# Patient Record
Sex: Female | Born: 1935 | Race: White | Hispanic: No | Smoking: Never smoker
Health system: Southern US, Community
[De-identification: ages and names within clinical notes are randomized; demographics above are authoritative.]

## PROBLEM LIST (undated history)

## (undated) DIAGNOSIS — J189 Pneumonia, unspecified organism: Secondary | ICD-10-CM

## (undated) DIAGNOSIS — I5189 Other ill-defined heart diseases: Secondary | ICD-10-CM

## (undated) DIAGNOSIS — I739 Peripheral vascular disease, unspecified: Secondary | ICD-10-CM

## (undated) DIAGNOSIS — K529 Noninfective gastroenteritis and colitis, unspecified: Secondary | ICD-10-CM

## (undated) DIAGNOSIS — C801 Malignant (primary) neoplasm, unspecified: Secondary | ICD-10-CM

## (undated) DIAGNOSIS — J45909 Unspecified asthma, uncomplicated: Secondary | ICD-10-CM

## (undated) DIAGNOSIS — I2510 Atherosclerotic heart disease of native coronary artery without angina pectoris: Secondary | ICD-10-CM

## (undated) DIAGNOSIS — I82409 Acute embolism and thrombosis of unspecified deep veins of unspecified lower extremity: Secondary | ICD-10-CM

## (undated) DIAGNOSIS — I5032 Chronic diastolic (congestive) heart failure: Secondary | ICD-10-CM

## (undated) DIAGNOSIS — M810 Age-related osteoporosis without current pathological fracture: Secondary | ICD-10-CM

## (undated) DIAGNOSIS — J479 Bronchiectasis, uncomplicated: Secondary | ICD-10-CM

## (undated) DIAGNOSIS — I43 Cardiomyopathy in diseases classified elsewhere: Secondary | ICD-10-CM

## (undated) DIAGNOSIS — G43109 Migraine with aura, not intractable, without status migrainosus: Secondary | ICD-10-CM

## (undated) DIAGNOSIS — I6529 Occlusion and stenosis of unspecified carotid artery: Secondary | ICD-10-CM

## (undated) DIAGNOSIS — M1990 Unspecified osteoarthritis, unspecified site: Secondary | ICD-10-CM

## (undated) DIAGNOSIS — F329 Major depressive disorder, single episode, unspecified: Secondary | ICD-10-CM

## (undated) DIAGNOSIS — E854 Organ-limited amyloidosis: Secondary | ICD-10-CM

## (undated) DIAGNOSIS — I4719 Other supraventricular tachycardia: Secondary | ICD-10-CM

## (undated) DIAGNOSIS — C50919 Malignant neoplasm of unspecified site of unspecified female breast: Secondary | ICD-10-CM

## (undated) DIAGNOSIS — R42 Dizziness and giddiness: Secondary | ICD-10-CM

## (undated) DIAGNOSIS — F32A Depression, unspecified: Secondary | ICD-10-CM

## (undated) DIAGNOSIS — I255 Ischemic cardiomyopathy: Secondary | ICD-10-CM

## (undated) DIAGNOSIS — I10 Essential (primary) hypertension: Secondary | ICD-10-CM

## (undated) DIAGNOSIS — I471 Supraventricular tachycardia: Secondary | ICD-10-CM

## (undated) DIAGNOSIS — M797 Fibromyalgia: Secondary | ICD-10-CM

## (undated) DIAGNOSIS — J449 Chronic obstructive pulmonary disease, unspecified: Secondary | ICD-10-CM

## (undated) DIAGNOSIS — I509 Heart failure, unspecified: Secondary | ICD-10-CM

## (undated) DIAGNOSIS — D649 Anemia, unspecified: Secondary | ICD-10-CM

## (undated) DIAGNOSIS — A809 Acute poliomyelitis, unspecified: Secondary | ICD-10-CM

## (undated) HISTORY — DX: Organ-limited amyloidosis: I43

## (undated) HISTORY — DX: Other ill-defined heart diseases: I51.89

## (undated) HISTORY — PX: SQUAMOUS CELL CARCINOMA EXCISION: SHX2433

## (undated) HISTORY — DX: Heart failure, unspecified: I50.9

## (undated) HISTORY — PX: TONSILLECTOMY: SUR1361

## (undated) HISTORY — PX: BACK SURGERY: SHX140

## (undated) HISTORY — DX: Dizziness and giddiness: R42

## (undated) HISTORY — DX: Essential (primary) hypertension: I10

## (undated) HISTORY — PX: BREAST SURGERY: SHX581

## (undated) HISTORY — DX: Fibromyalgia: M79.7

## (undated) HISTORY — PX: BILATERAL TOTAL MASTECTOMY WITH AXILLARY LYMPH NODE DISSECTION: SHX6364

## (undated) HISTORY — PX: ABDOMINAL HYSTERECTOMY: SHX81

## (undated) HISTORY — DX: Chronic diastolic (congestive) heart failure: I50.32

## (undated) HISTORY — DX: Migraine with aura, not intractable, without status migrainosus: G43.109

## (undated) HISTORY — PX: VAGINAL HYSTERECTOMY: SHX2639

## (undated) HISTORY — DX: Organ-limited amyloidosis: E85.4

## (undated) HISTORY — PX: HERNIA REPAIR: SHX51

## (undated) HISTORY — PX: REPLACEMENT TOTAL KNEE: SUR1224

## (undated) HISTORY — DX: Unspecified osteoarthritis, unspecified site: M19.90

## (undated) HISTORY — DX: Age-related osteoporosis without current pathological fracture: M81.0

## (undated) HISTORY — DX: Supraventricular tachycardia: I47.1

## (undated) HISTORY — DX: Atherosclerotic heart disease of native coronary artery without angina pectoris: I25.10

## (undated) HISTORY — PX: FINGER TENDON REPAIR: SHX1640

## (undated) HISTORY — PX: UMBILICAL HERNIA REPAIR: SHX196

## (undated) HISTORY — PX: CARPAL TUNNEL RELEASE: SHX101

## (undated) HISTORY — DX: Other supraventricular tachycardia: I47.19

## (undated) HISTORY — DX: Ischemic cardiomyopathy: I25.5

## (undated) HISTORY — DX: Acute embolism and thrombosis of unspecified deep veins of unspecified lower extremity: I82.409

---
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
## 2007-06-06 DIAGNOSIS — J479 Bronchiectasis, uncomplicated: Secondary | ICD-10-CM

## 2007-06-06 HISTORY — DX: Bronchiectasis, uncomplicated: J47.9

## 2014-04-12 ENCOUNTER — Emergency Department: Payer: Self-pay | Admitting: Emergency Medicine

## 2014-04-19 ENCOUNTER — Telehealth: Payer: Self-pay | Admitting: Cardiovascular Disease

## 2014-04-19 NOTE — Telephone Encounter (Signed)
Patient would like for you to call her regarding what records Dr. Rockey Situ needs from her provider in [place].  She said that she has access to records but does not know specifically what we need.

## 2014-04-20 NOTE — Telephone Encounter (Signed)
Spoke w/ pt.  She states that her records on "e-clinical". Advised her that we would need any records related to any cardiac testing and/or lipids. She states that she has a "filter" in her leg, but she is unsure of when. Advised pt that we can have her sign a release at her visit and obtain these records for her if she is unable to do so.  She is appreciative and would like to see about getting a sooner appt.

## 2014-04-30 ENCOUNTER — Encounter (INDEPENDENT_AMBULATORY_CARE_PROVIDER_SITE_OTHER): Payer: Self-pay

## 2014-04-30 ENCOUNTER — Ambulatory Visit (INDEPENDENT_AMBULATORY_CARE_PROVIDER_SITE_OTHER): Payer: Medicare HMO | Admitting: Cardiovascular Disease

## 2014-04-30 ENCOUNTER — Encounter: Payer: Self-pay | Admitting: Cardiovascular Disease

## 2014-04-30 VITALS — BP 128/88 | HR 59 | Ht 63.0 in | Wt 126.2 lb

## 2014-04-30 DIAGNOSIS — M7989 Other specified soft tissue disorders: Secondary | ICD-10-CM | POA: Insufficient documentation

## 2014-04-30 DIAGNOSIS — R030 Elevated blood-pressure reading, without diagnosis of hypertension: Secondary | ICD-10-CM

## 2014-04-30 DIAGNOSIS — Z95828 Presence of other vascular implants and grafts: Secondary | ICD-10-CM

## 2014-04-30 DIAGNOSIS — Z9889 Other specified postprocedural states: Secondary | ICD-10-CM

## 2014-04-30 DIAGNOSIS — Z86718 Personal history of other venous thrombosis and embolism: Secondary | ICD-10-CM | POA: Insufficient documentation

## 2014-04-30 DIAGNOSIS — R0602 Shortness of breath: Secondary | ICD-10-CM | POA: Insufficient documentation

## 2014-04-30 DIAGNOSIS — I158 Other secondary hypertension: Secondary | ICD-10-CM

## 2014-04-30 DIAGNOSIS — R0989 Other specified symptoms and signs involving the circulatory and respiratory systems: Secondary | ICD-10-CM | POA: Insufficient documentation

## 2014-04-30 DIAGNOSIS — J479 Bronchiectasis, uncomplicated: Secondary | ICD-10-CM | POA: Insufficient documentation

## 2014-04-30 NOTE — Assessment & Plan Note (Signed)
Leg swelling predominantly on the right, from venous insufficiency. Exacerbated by right knee surgery, history of DVT, notable varicosities seen on exam. Recommended leg elevation and compression hose. If symptoms get worse, could try low-dose diuretic. Currently taking HCTZ at low-dose. Symptoms seem stable

## 2014-04-30 NOTE — Patient Instructions (Signed)
You are doing well.  Please monitor your blood pressure at home [facility] to try a 1/2 pill daily Goal BP <140/<90 Call with numbers  For leg swelling: Compression hose Leg wrap? Leg elevation  Please call us if you have new issues that need to be addressed before your next appt.

## 2014-04-30 NOTE — Progress Notes (Signed)
Patient ID: Lindsey [name], female    DOB: [birth year], 78 y.o.   MRN: [id number]  HPI Comments: Lindsey [name] is a pleasant 78 year old woman with history of lower extremity swelling, prior right knee surgery, right DVT, status post IVC filter, bronchiectasis on inhalers and nebulizers at home who presents for evaluation of labile blood pressure and her leg swelling.  She reports that she has a chronic history of right lower extremity swelling, much worse than the left. History dates back many years but started with right knee surgery after an accident, fracture. She suffered a DVT, had IVC filter placed. She does not wear compression hose on a regular basis. Swelling worse at the end of the day, better in the morning. She does have significant varicosities.  She reports her blood pressure relatively labile. Typically is well controlled in the 120-130 range, often higher in the setting of migraines or headaches.  She does report an exposure to black mold in the past, never smoked. This is likely the cause of her bronchiectasis. This was diagnosed at outside facility. She does do periodic antibiotics and prednisone. Take nebulizers and Dularea at home.  Port having a stress test may 2015 which was reportedly normal  Recently in the emergency room several weeks ago for headache. Blood pressure that time was elevated, CT scan of the head was normal. Periodically has severe headaches  EKG shows normal sinus rhythm with rate 59 beats per minute, right bundle branch block, left anterior fascicular block   Outpatient Encounter Prescriptions as of 04/30/2014  Medication Sig  . Calcium Carbonate-Vitamin D (CALCIUM 500 + D PO) Take 1,000 mcg by mouth daily.  . DULoxetine (CYMBALTA) 60 MG capsule Take 60 mg by mouth daily.  Marland Kitchen losartan-hydrochlorothiazide (HYZAAR) 100-25 MG per tablet Take 1 tablet by mouth daily.  . mometasone-formoterol (DULERA) 100-5 MCG/ACT AERO Inhale 2 puffs into the lungs 2 (two)  times daily.  . Omega-3 Fatty Acids (FISH OIL) 1000 MG CAPS Take 1,000 mg by mouth daily.  . Probiotic Product (PRO-BIOTIC BLEND) CAPS Take by mouth daily.  . rizatriptan (MAXALT) 10 MG tablet Take 10 mg by mouth as needed for migraine. May repeat in 2 hours if needed    Review of Systems  Constitutional: Negative.   HENT: Negative.   Eyes: Negative.   Respiratory: Negative.   Cardiovascular: Negative.   Gastrointestinal: Negative.   Endocrine: Negative.   Musculoskeletal: Negative.   Skin: Negative.   Allergic/Immunologic: Negative.   Neurological: Negative.   Hematological: Negative.   Psychiatric/Behavioral: Negative.   All other systems reviewed and are negative.   BP 128/88  Pulse 59  Ht 5\' 3"  (1.6 m)  Wt 126 lb 4 oz (57.267 kg)  BMI 22.37 kg/m2  Physical Exam  Nursing note and vitals reviewed. Constitutional: She is oriented to person, place, and time. She appears well-developed and well-nourished.  HENT:  Head: Normocephalic.  Nose: Nose normal.  Mouth/Throat: Oropharynx is clear and moist.  Eyes: Conjunctivae are normal. Pupils are equal, round, and reactive to light.  Neck: Normal range of motion. Neck supple. No JVD present.  Cardiovascular: Normal rate, regular rhythm, S1 normal, S2 normal, normal heart sounds and intact distal pulses.  Exam reveals no gallop and no friction rub.   No murmur heard. Pulmonary/Chest: Effort normal and breath sounds normal. No respiratory distress. She has no wheezes. She has no rales. She exhibits no tenderness.  Abdominal: Soft. Bowel sounds are normal. She exhibits no distension. There is no  tenderness.  Musculoskeletal: Normal range of motion. She exhibits no edema and no tenderness.  Lymphadenopathy:    She has no cervical adenopathy.  Neurological: She is alert and oriented to person, place, and time. Coordination normal.  Skin: Skin is warm and dry. No rash noted. No erythema.  Psychiatric: She has a normal mood and  affect. Her behavior is normal. Judgment and thought content normal.    Assessment and Plan

## 2014-04-30 NOTE — Assessment & Plan Note (Signed)
Recommended she use compression hose for long car trips or plane rides. She will be high risk of DVT given prior history of DVT. She does have IVC filter in place

## 2014-04-30 NOTE — Assessment & Plan Note (Signed)
For the most part blood pressure seems to be well controlled when she does not have pain. Recommended that she monitor her blood pressure at home when she feels well, not only when she has headaches or pain. Suggested she call our office if numbers run high on a regular basis. If blood pressure was low, would cut her Hyzaar in half

## 2014-04-30 NOTE — Assessment & Plan Note (Signed)
Prior trauma to her right knee with fracture. Residual lower extremity swelling

## 2014-04-30 NOTE — Assessment & Plan Note (Signed)
History of bronchiectasis which she attributes to previous exposure to mold. No smoking history. Periodically need antibiotics, prednisone. She does take nebulizers and dulera at home

## 2014-10-10 ENCOUNTER — Inpatient Hospital Stay: Payer: Self-pay | Admitting: Internal Medicine

## 2014-10-10 LAB — BASIC METABOLIC PANEL
Anion Gap: 8 (ref 7–16)
BUN: 13 mg/dL (ref 7–18)
Calcium, Total: 8.2 mg/dL — ABNORMAL LOW (ref 8.5–10.1)
Chloride: 101 mmol/L (ref 98–107)
Co2: 28 mmol/L (ref 21–32)
Creatinine: 0.49 mg/dL — ABNORMAL LOW (ref 0.60–1.30)
EGFR (African American): >60
EGFR (Non-African Amer.): >60
Glucose: 102 mg/dL — ABNORMAL HIGH (ref 65–99)
Osmolality: 274 (ref 275–301)
Potassium: 3.5 mmol/L (ref 3.5–5.1)
Sodium: 137 mmol/L (ref 136–145)

## 2014-10-10 LAB — CBC WITH DIFFERENTIAL/PLATELET
Basophil #: 0 10*3/uL (ref 0.0–0.1)
Basophil %: 0.2 %
Eosinophil #: 0 10*3/uL (ref 0.0–0.7)
Eosinophil %: 0.1 %
HCT: 38.1 % (ref 35.0–47.0)
HGB: 13 g/dL (ref 12.0–16.0)
Lymphocyte #: 0.3 10*3/uL — ABNORMAL LOW (ref 1.0–3.6)
Lymphocyte %: 2.8 %
MCH: 31.1 pg (ref 26.0–34.0)
MCHC: 34.2 g/dL (ref 32.0–36.0)
MCV: 91 fL (ref 80–100)
Monocyte #: 0.5 x10 3/mm (ref 0.2–0.9)
Monocyte %: 5.5 %
Neutrophil #: 8.2 10*3/uL — ABNORMAL HIGH (ref 1.4–6.5)
Neutrophil %: 91.4 %
Platelet: 217 10*3/uL (ref 150–440)
RBC: 4.18 10*6/uL (ref 3.80–5.20)
RDW: 13.6 % (ref 11.5–14.5)
WBC: 8.9 10*3/uL (ref 3.6–11.0)

## 2014-10-10 LAB — INFLUENZA A,B,H1N1 - PCR (ARMC)
H1N1 flu by pcr: NOT DETECTED
Influenza A By PCR: NEGATIVE
Influenza B By PCR: NEGATIVE

## 2014-10-11 LAB — BASIC METABOLIC PANEL
Anion Gap: 6 — ABNORMAL LOW (ref 7–16)
BUN: 10 mg/dL (ref 7–18)
Calcium, Total: 7.9 mg/dL — ABNORMAL LOW (ref 8.5–10.1)
Chloride: 103 mmol/L (ref 98–107)
Co2: 28 mmol/L (ref 21–32)
Creatinine: 0.63 mg/dL (ref 0.60–1.30)
EGFR (African American): >60
EGFR (Non-African Amer.): >60
Glucose: 105 mg/dL — ABNORMAL HIGH (ref 65–99)
Osmolality: 273 (ref 275–301)
Potassium: 3.2 mmol/L — ABNORMAL LOW (ref 3.5–5.1)
Sodium: 137 mmol/L (ref 136–145)

## 2014-10-11 LAB — CBC WITH DIFFERENTIAL/PLATELET
Basophil #: 0 10*3/uL (ref 0.0–0.1)
Basophil %: 0.3 %
Eosinophil #: 0 10*3/uL (ref 0.0–0.7)
Eosinophil %: 0.3 %
HCT: 38.5 % (ref 35.0–47.0)
HGB: 12.5 g/dL (ref 12.0–16.0)
Lymphocyte #: 0.3 10*3/uL — ABNORMAL LOW (ref 1.0–3.6)
Lymphocyte %: 4.9 %
MCH: 30.4 pg (ref 26.0–34.0)
MCHC: 32.6 g/dL (ref 32.0–36.0)
MCV: 93 fL (ref 80–100)
Monocyte #: 0.5 x10 3/mm (ref 0.2–0.9)
Monocyte %: 8.6 %
Neutrophil #: 5.4 10*3/uL (ref 1.4–6.5)
Neutrophil %: 85.9 %
Platelet: 189 10*3/uL (ref 150–440)
RBC: 4.13 10*6/uL (ref 3.80–5.20)
RDW: 13.5 % (ref 11.5–14.5)
WBC: 6.3 10*3/uL (ref 3.6–11.0)

## 2014-10-11 LAB — CLOSTRIDIUM DIFFICILE(ARMC)

## 2014-10-12 LAB — BASIC METABOLIC PANEL
Anion Gap: 7 (ref 7–16)
BUN: 6 mg/dL — ABNORMAL LOW (ref 7–18)
Calcium, Total: 7.5 mg/dL — ABNORMAL LOW (ref 8.5–10.1)
Chloride: 105 mmol/L (ref 98–107)
Co2: 26 mmol/L (ref 21–32)
Creatinine: 0.53 mg/dL — ABNORMAL LOW (ref 0.60–1.30)
EGFR (African American): >60
EGFR (Non-African Amer.): >60
Glucose: 96 mg/dL (ref 65–99)
Osmolality: 273 (ref 275–301)
Potassium: 2.6 mmol/L — ABNORMAL LOW (ref 3.5–5.1)
Sodium: 138 mmol/L (ref 136–145)

## 2014-10-12 LAB — CBC WITH DIFFERENTIAL/PLATELET
Basophil #: 0 10*3/uL (ref 0.0–0.1)
Basophil %: 0.2 %
Eosinophil #: 0.1 10*3/uL (ref 0.0–0.7)
Eosinophil %: 1.1 %
HCT: 35.2 % (ref 35.0–47.0)
HGB: 11.6 g/dL — ABNORMAL LOW (ref 12.0–16.0)
Lymphocyte #: 0.4 10*3/uL — ABNORMAL LOW (ref 1.0–3.6)
Lymphocyte %: 7.6 %
MCH: 30 pg (ref 26.0–34.0)
MCHC: 32.9 g/dL (ref 32.0–36.0)
MCV: 91 fL (ref 80–100)
Monocyte #: 0.6 x10 3/mm (ref 0.2–0.9)
Monocyte %: 11.3 %
Neutrophil #: 4.4 10*3/uL (ref 1.4–6.5)
Neutrophil %: 79.8 %
Platelet: 179 10*3/uL (ref 150–440)
RBC: 3.86 10*6/uL (ref 3.80–5.20)
RDW: 13.5 % (ref 11.5–14.5)
WBC: 5.6 10*3/uL (ref 3.6–11.0)

## 2014-10-13 LAB — CBC WITH DIFFERENTIAL/PLATELET
Basophil #: 0 10*3/uL (ref 0.0–0.1)
Basophil %: 0.3 %
Eosinophil #: 0.1 10*3/uL (ref 0.0–0.7)
Eosinophil %: 1.9 %
HCT: 36.1 % (ref 35.0–47.0)
HGB: 11.9 g/dL — ABNORMAL LOW (ref 12.0–16.0)
Lymphocyte #: 0.5 10*3/uL — ABNORMAL LOW (ref 1.0–3.6)
Lymphocyte %: 7.9 %
MCH: 30.1 pg (ref 26.0–34.0)
MCHC: 32.9 g/dL (ref 32.0–36.0)
MCV: 92 fL (ref 80–100)
Monocyte #: 0.8 x10 3/mm (ref 0.2–0.9)
Monocyte %: 13.4 %
Neutrophil #: 4.4 10*3/uL (ref 1.4–6.5)
Neutrophil %: 76.5 %
Platelet: 190 10*3/uL (ref 150–440)
RBC: 3.94 10*6/uL (ref 3.80–5.20)
RDW: 13.3 % (ref 11.5–14.5)
WBC: 5.7 10*3/uL (ref 3.6–11.0)

## 2014-10-13 LAB — BASIC METABOLIC PANEL
Anion Gap: 7 (ref 7–16)
BUN: 4 mg/dL — ABNORMAL LOW (ref 7–18)
Calcium, Total: 7.8 mg/dL — ABNORMAL LOW (ref 8.5–10.1)
Chloride: 104 mmol/L (ref 98–107)
Co2: 29 mmol/L (ref 21–32)
Creatinine: 0.47 mg/dL — ABNORMAL LOW (ref 0.60–1.30)
EGFR (African American): >60
EGFR (Non-African Amer.): >60
Glucose: 99 mg/dL (ref 65–99)
Osmolality: 276 (ref 275–301)
Potassium: 2.7 mmol/L — ABNORMAL LOW (ref 3.5–5.1)
Sodium: 140 mmol/L (ref 136–145)

## 2014-10-14 LAB — BASIC METABOLIC PANEL
Anion Gap: 7 (ref 7–16)
BUN: 3 mg/dL — ABNORMAL LOW (ref 7–18)
Calcium, Total: 7.8 mg/dL — ABNORMAL LOW (ref 8.5–10.1)
Chloride: 105 mmol/L (ref 98–107)
Co2: 27 mmol/L (ref 21–32)
Creatinine: 0.47 mg/dL — ABNORMAL LOW (ref 0.60–1.30)
EGFR (African American): >60
EGFR (Non-African Amer.): >60
Glucose: 98 mg/dL (ref 65–99)
Osmolality: 274 (ref 275–301)
Potassium: 3.3 mmol/L — ABNORMAL LOW (ref 3.5–5.1)
Sodium: 139 mmol/L (ref 136–145)

## 2014-10-14 LAB — CBC WITH DIFFERENTIAL/PLATELET
Basophil #: 0 10*3/uL (ref 0.0–0.1)
Basophil %: 0.3 %
Eosinophil #: 0.2 10*3/uL (ref 0.0–0.7)
Eosinophil %: 4.3 %
HCT: 35 % (ref 35.0–47.0)
HGB: 11.6 g/dL — ABNORMAL LOW (ref 12.0–16.0)
Lymphocyte #: 0.5 10*3/uL — ABNORMAL LOW (ref 1.0–3.6)
Lymphocyte %: 9.4 %
MCH: 30.4 pg (ref 26.0–34.0)
MCHC: 33.1 g/dL (ref 32.0–36.0)
MCV: 92 fL (ref 80–100)
Monocyte #: 0.8 x10 3/mm (ref 0.2–0.9)
Monocyte %: 14.5 %
Neutrophil #: 3.8 10*3/uL (ref 1.4–6.5)
Neutrophil %: 71.5 %
Platelet: 196 10*3/uL (ref 150–440)
RBC: 3.81 10*6/uL (ref 3.80–5.20)
RDW: 13.2 % (ref 11.5–14.5)
WBC: 5.4 10*3/uL (ref 3.6–11.0)

## 2014-10-15 LAB — CBC WITH DIFFERENTIAL/PLATELET
Basophil #: 0 10*3/uL (ref 0.0–0.1)
Basophil %: 0.5 %
Eosinophil #: 0.3 10*3/uL (ref 0.0–0.7)
Eosinophil %: 7.6 %
HCT: 34.6 % — ABNORMAL LOW (ref 35.0–47.0)
HGB: 11.4 g/dL — ABNORMAL LOW (ref 12.0–16.0)
Lymphocyte #: 0.8 10*3/uL — ABNORMAL LOW (ref 1.0–3.6)
Lymphocyte %: 20 %
MCH: 30.1 pg (ref 26.0–34.0)
MCHC: 33 g/dL (ref 32.0–36.0)
MCV: 91 fL (ref 80–100)
Monocyte #: 0.8 x10 3/mm (ref 0.2–0.9)
Monocyte %: 18.1 %
Neutrophil #: 2.3 10*3/uL (ref 1.4–6.5)
Neutrophil %: 53.8 %
Platelet: 215 10*3/uL (ref 150–440)
RBC: 3.8 10*6/uL (ref 3.80–5.20)
RDW: 13.3 % (ref 11.5–14.5)
WBC: 4.2 10*3/uL (ref 3.6–11.0)

## 2014-10-15 LAB — CULTURE, BLOOD (SINGLE)

## 2014-10-15 LAB — BASIC METABOLIC PANEL
Anion Gap: 7 (ref 7–16)
BUN: 7 mg/dL (ref 7–18)
Calcium, Total: 8 mg/dL — ABNORMAL LOW (ref 8.5–10.1)
Chloride: 108 mmol/L — ABNORMAL HIGH (ref 98–107)
Co2: 27 mmol/L (ref 21–32)
Creatinine: 0.49 mg/dL — ABNORMAL LOW (ref 0.60–1.30)
EGFR (African American): >60
EGFR (Non-African Amer.): >60
Glucose: 91 mg/dL (ref 65–99)
Osmolality: 281 (ref 275–301)
Potassium: 3.9 mmol/L (ref 3.5–5.1)
Sodium: 142 mmol/L (ref 136–145)

## 2014-10-16 LAB — CBC WITH DIFFERENTIAL/PLATELET
Basophil #: 0 10*3/uL (ref 0.0–0.1)
Basophil %: 0.7 %
Eosinophil #: 0.4 10*3/uL (ref 0.0–0.7)
Eosinophil %: 7.8 %
HCT: 37.6 % (ref 35.0–47.0)
HGB: 12.5 g/dL (ref 12.0–16.0)
Lymphocyte #: 1.3 10*3/uL (ref 1.0–3.6)
Lymphocyte %: 26.4 %
MCH: 30 pg (ref 26.0–34.0)
MCHC: 33.2 g/dL (ref 32.0–36.0)
MCV: 90 fL (ref 80–100)
Monocyte #: 0.8 x10 3/mm (ref 0.2–0.9)
Monocyte %: 15.8 %
Neutrophil #: 2.4 10*3/uL (ref 1.4–6.5)
Neutrophil %: 49.3 %
Platelet: 258 10*3/uL (ref 150–440)
RBC: 4.16 10*6/uL (ref 3.80–5.20)
RDW: 13.7 % (ref 11.5–14.5)
WBC: 4.9 10*3/uL (ref 3.6–11.0)

## 2014-10-16 LAB — BASIC METABOLIC PANEL
Anion Gap: 7 (ref 7–16)
BUN: 6 mg/dL — ABNORMAL LOW (ref 7–18)
Calcium, Total: 8.3 mg/dL — ABNORMAL LOW (ref 8.5–10.1)
Chloride: 109 mmol/L — ABNORMAL HIGH (ref 98–107)
Co2: 27 mmol/L (ref 21–32)
Creatinine: 0.52 mg/dL — ABNORMAL LOW (ref 0.60–1.30)
EGFR (African American): >60
EGFR (Non-African Amer.): >60
Glucose: 89 mg/dL (ref 65–99)
Osmolality: 282 (ref 275–301)
Potassium: 3.9 mmol/L (ref 3.5–5.1)
Sodium: 143 mmol/L (ref 136–145)

## 2015-01-31 ENCOUNTER — Ambulatory Visit
Admit: 2015-01-31 | Disposition: A | Discharge status: Home / Self Care | Payer: Self-pay | Attending: Vascular Surgery | Admitting: Vascular Surgery

## 2015-02-03 NOTE — Consult Note (Signed)
Pt with C. diff on vancomycin.  I will sign off, reconsult if needed.  If patient has diarrhea after she finishes her course of vancomycin she should contact our office ASAP.  Electronic Signatures: Manya Silvas (MD)  (Signed on 11-Jan-16 15:56)  Authored  Last Updated: 11-Jan-16 15:56 by Manya Silvas (MD)

## 2015-02-03 NOTE — Consult Note (Signed)
I spoke to Dr. Murvin Natal who had a high degree of confidence that the course of antibiotics for the orbital infection has been sufficient to stop it at this time.  i will treat her with oral vancomycin for 10 days starting now.  This of course can be partly given as an out patient.    Electronic Signatures: Manya Silvas (MD)  (Signed on 09-Jan-16 14:38)  Authored  Last Updated: 09-Jan-16 14:38 by Manya Silvas (MD)

## 2015-02-03 NOTE — Discharge Summary (Signed)
PATIENT NAME:  Lindsey [name], Lindsey [name] MR#:  [id number] DATE OF BIRTH:  03-19-1936  DATE OF ADMISSION:  10/10/2014 DATE OF DISCHARGE:  10/16/2014  DISCHARGE DIAGNOSES: 1. Diarrhea secondary to Clostridium difficile colitis.  2. Periorbital cellulitis of left eye.  3. Hypertension.  4. Depression.  5. History of migraine.  6. History of asthma.   CHIEF COMPLAINT: :Fever, chills, headache,  myalgia and left eyelid infection.   HISTORY OF PRESENT ILLNESS: Lindsey [name] is a 79 year old female with a history of migraine, hypertension, who was admitted for flu-like illness. The patient recently had developed left eye infection of the upper lid and has seen Dr. George Ina and was initially prescribed Augmentin, but subsequently started to develop fevers and chills and headaches, and when she came to the ED, her temperature was 99.6 and also underwent a CT of the maxillofacial area, which showed left basilar orbital soft tissue infection and was subsequently admitted. The patient later started to have diarrhea, and stool was positive for Clostridium difficile.   PAST MEDICAL HISTORY: Significant for hypertension, migraine headaches. Please see H and P for full details.   PHYSICAL EXAMINATION: VITAL SIGNS: Temp was 99.6, heart rate 91, respirations 18, blood pressure 151/78, and O2 sat 97% on room air.  HEENT: Pupils equal and reactive to light, erythema and swelling with mild tenderness noted in the left upper lid.  HEENT: /AT.  NECK: Supple.  LUNGS: Clear.  HEART: S1, S2.  ABDOMEN: Soft, nontender.  EXTREMITIES: No edema.  LABORATORY DATA: Hemoglobin 13, WBC count 8.9, glucose 102, BUN 13, creatinine 0.49. The patient did develop some hypokalemia with the potassium going down as low as 2.7, and the potassium was repleted. Blood cultures were negative. Stool was positive for Clostridium difficile. Wood antigen, as well as Clostridium difficile toxin A and B, was positive. The patient was initially  started on IV Flagyl and subsequently switched to p.o. vancomycin. She was seen in consultation by Dr. Wallace Going, ophthalmology and also Dr. Vira Agar, GI. Following vancomycin treatment, the patient's diarrhea significantly improved. The hypokalemia is also corrected.   DISCHARGE MEDICATIONS: She was discharged in stable condition on the following medications: Vancomycin 250 mg p.o. q. 6 hours for 10 days, budesonide -formoterol 160/4.5 one puff b.i.d., Benefiber 1 tablet once a day as needed, MiraLax as needed, fish oil once a day. Vitamin K, calcium, and vitamin D 2 tablets once a day. Clobetasol topical 0.05% to left upper lid once a day as needed, Zyrtec 10 mg once a day, terconazole topical to vaginal area once a day as needed. Meclizine 25 mg once a day as needed, cyclobenzaprine 5 mg once a day as needed, losartan/hydrochlorothiazide 100/25 one tablet once a day, duloxetine 60 mg once a day, and rizatriptan 5 mg once a day as needed for migraine headaches.   DISCHARGE INSTRUCTIONS: The patient was advised to drink plenty of fluids and to call back with any questions or concerns. If diarrhea is persistent, she will follow up with GI. Otherwise, follow up with me, Dr. Ginette Pitman, in 1-2 weeks' time.   CONDITION ON DISCHARGE: Stable.   TOTAL TIME SPENT ON DISCHARGING THIS PATIENT: 35 minutes   ____________________________ Tracie Harrier, MD vh:mw D: 10/17/2014 12:41:38 ET T: 10/17/2014 15:56:57 ET JOB#: [id number]  cc: Tracie Harrier, MD, <Dictator> Tracie Harrier MD ELECTRONICALLY SIGNED 10/25/2014 17:46

## 2015-02-03 NOTE — Op Note (Signed)
PATIENT NAME:  Lindsey [name], Lindsey [name] MR#:  [id number] DATE OF BIRTH:  01/06/1936  DATE OF PROCEDURE:  01/31/2015  PREOPERATIVE DIAGNOSES:  1.  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  2.  Hypertension.   POSTOPERATIVE DIAGNOSES:   1.  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  2.  Hypertension.   PROCEDURES:  1.  Ultrasound guidance for vascular access to right jugular vein.  2.  Catheter placement to IVC from right jugular venous approach.  3.  Inferior venacavogram.  4.  Percutaneous transluminal angioplasty of IVC at the top of the hook for embedded filter and stricture of vein with 8 mm diameter angioplasty balloon.   SURGEON: Algernon Huxley, MD    ANESTHESIA: Local with moderate conscious sedation.   BLOOD LOSS:  25 mL.    FLUOROSCOPY TIME: Approximately 25 minutes and 15 mL of contrast were used.   INDICATION FOR PROCEDURE: A 79 year old white female who had a filter placed 4 years ago at an outside facility. She came to me desiring to have this filter removed. We brought her in with the intention of removing the filter if at all possible but informed her that a filter that old was very likely embedded and may not be able to be removed. Risks and benefits were discussed. Informed consent was obtained.   DESCRIPTION OF THE PROCEDURE: The patient was brought to the vascular suite. Right neck was sterilely prepped and draped and a sterile surgical field was created. The right jugular vein was visualized with ultrasound and found to be widely patent. It was accessed under direct ultrasound guidance without difficulty with a Seldinger needle and a permanent image was recorded. A J-wire was then placed after skin nick and dilatation. The retrieval sheath was placed into the IVC, later exchanged for a 12 French curved Ansel sheath. It was clear from the  original venogram that the filter hook was well embedded into the left lateral wall at the base of the left renal vein. I used several different snares and attempted to snare the hook on the filter but this was not readily possible. I gave a small dose of intravenous heparin. I used a buddy wire technique. 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 With the balloon in place we tried to snare the hook and manipulate the filter but nothing was successful. It was clear after almost 25 minutes of fluoroscopy that this was going to be a filter that would not be retrieved percutaneously today and I elected to terminate the procedure. The sheath was removed. Pressure was held. Sterile dressing was placed. The patient tolerated the procedure well.    ____________________________ Algernon Huxley, MD jsd:AT D: 01/31/2015 17:02:38 ET T: 01/31/2015 23:44:47 ET JOB#: [id number]  cc: Algernon Huxley, MD, <Dictator> Algernon Huxley MD ELECTRONICALLY SIGNED 02/01/2015 17:29

## 2015-02-03 NOTE — H&P (Signed)
PATIENT NAME:  Lindsey [name], Lindsey [name] MR#:  [id number] DATE OF BIRTH:  27-May-1936  DATE OF ADMISSION:  10/10/2014  PRIMARY CARE PHYSICIAN:  Tracie Harrier, MD   REQUESTING PHYSICIAN:  Larae Grooms, MD   CHIEF COMPLAINT:  Flu like illness.   HISTORY OF PRESENT ILLNESS: The patient is a 79 year old female with a known history of migraine, hypertension, who is being admitted for flu like illness. Patient had some eye infection about a week ago in the left eye and she was seen by Dr. George Ina, was prescribed amoxicillin and was asked if she would have any worsening call them back. She lives at [facility]. She started noticing fever, chills, and headache and   body ache started this morning and she decided to come to the Emergency Department. While in the ED, she is still very symptomatic; had a temperature of 99.6, her left eye  seems somewhat better. Underwent CT scan of the maxillofacial area, which showed some left basilar orbital soft tissue infection and she is being admitted for further evaluation and management.   PAST MEDICAL HISTORY: 1.  History of migraine.  2.  Hypertension.   ALLERGIES: CODEINE, SULFA, TETRACYCLINE, METALS  .   SOCIAL HISTORY: She drinks 1 glass of wine every night; no smoking; she used to work as a Electrical engineer.   FAMILY HISTORY: Father had bladder cancer; mother with pancreatic cancer.   MEDICATIONS AT HOME: 1.  Ambien 1 tablet p.o. daily as needed.  2.  Augmentin 1 tablet p.o. b.i.d. prescribed by Dr. George Ina for eye infection.  3.  Benefiber as needed.  4.  Calcium with vitamin D  1 tablets p.o. daily.  5.  Clobestasol 0.05% topical to affected area as needed.  6.  Cyclobenzaprine 5 mg p.o. daily as needed.  7.  Dulera 2 puffs inhaled twice a day.  8.  Duloxetine 60 mg p.o. daily.  9.  Fish oil once daily.  10.  Hydrochlorothiazide/losartan 25/100 one tablet p.o. daily.  11.  Meclizine 25 mg p.o. daily as needed.  12.  MiraLAX once daily as  needed.  13.  Zyrtec 1 tablet p.o. daily as needed.  14.  Simply Saline 1 spray intranasally daily.  15.  Parconazole topical to affected area as needed.    REVIEW OF SYSTEMS:  CONSTITUTIONAL: Positive for fever, fatigue, and weakness.  EYES: No blurry or double vision, she does have some redness around her left eye; that seems to be getting better.  ENT: No tinnitus or ear pain.  RESPIRATORY: No cough, wheezing, hemoptysis.  CARDIOVASCULAR: No chest pain, orthopnea, edema.  GASTROINTESTINAL: No nausea, vomiting, diarrhea.  GENITOURINARY: Hematuria.  ENDOCRINE: No polyuria or nocturia.  HEMATOLOGY: No anemia or easy bruising.  SKIN: No rash or lesion.  MUSCULOSKELETAL: Body aches and headache, joint pains.  NEUROLOGIC: No tingling, numbness, positive for weakness and headache.  PSYCHIATRY: No history of anxiety or depression.   PHYSICAL EXAMINATION: VITAL SIGNS: Temperature 99.6, heart rate 91 per minute, respirations 18 per minute, blood pressure 151/78, she is saturating 97% on room air.  GENERAL: The patient is a 79 year old female lying in the bed comfortably without any acute distress.  EYES: Pupils equal, round, reactive to light and accommodation, no scleral icterus, extraocular muscles intact. minimal swelling and erythema of lt eyelid. HEENT: Head atraumatic, normocephalic, oropharynx and nasopharynx clear.   NECK: Supple, nontender, no JVD , no thyroid enlargement or tenderenss.    LUNGS: Clear to auscultation bilaterally, no wheezing, rales, rhonchi, or  crepitations.  CARDIOVASCULAR: S1, S2 normal, no murmurs.  ABDOMEN: Soft, nontender, nondistended, bowel sounds present, no organomegaly or mass.  EXTREMITIES: No edema, cyanosis or clubbing.  NEUROLOGIC: Cranial nerves III  through XII intact, muscle strength  5/5 in all  extremities, sensation intact. Patient is alert and oriented x 3.  SKIN:  Patient has left eye periorbital area erythema with mild swelling, it seemed to be  improving; there is no demarcated area of swelling, certainly no signs of abscess.  MUSCULOSKELETAL: No joint effusion or tenderness.  PSYCHIATRY: Patient is alert and oriented x 3.   LABORATORY PANEL:  Normal BMP with a normal CBC.   CT scan of the maxillofacial area in the Emergency Department showed left preseptal and periorbital soft tissues thickening. No evidence of abscess or intraorbital extension, mild bilateral frontal and ethmoid sinus disease.   IMPRESSION AND PLAN: 1.  Flulike illness. Will check a flu test, place on droplet precautions until this gets ruled out.  2.  Eye infection of periorbital cellulitis at this time. We will continue IV Unasyn. We will get an ophthalmology consult to get their input.  3.  Hypertension. We will continue her home medication.  4.  Depression. We will continue duloxetine.   CODE STATUS: Full code.   Total time taking care of this patient was 35 minutes.    ____________________________ Lucina Mellow. Manuella Ghazi, MD vss:nt D: 10/10/2014 18:26:09 ET T: 10/10/2014 19:22:55 ET JOB#: [id number]  cc: Danah Reinecke S. Manuella Ghazi, MD, <Dictator> Tracie Harrier, MD [place] Diones. George Ina, MD  Remer Macho MD ELECTRONICALLY SIGNED 10/18/2014 22:10

## 2015-02-03 NOTE — Consult Note (Signed)
PATIENT NAME:  Lindsey [name], MENDOLIA MR#:  [id number] DATE OF BIRTH:  08-31-1936  DATE OF CONSULTATION:  10/13/2014  CONSULTING PHYSICIAN:  Manya Silvas, MD  HISTORY OF PRESENT ILLNESS:  The patient is a 79 year old white female who had orbital infection and was treated with broad-spectrum antibiotics. This responded to the infection however, she developed diarrhea and C. difficile infection and I was asked to see her in consultation.   The patient denies any history of Clostridium difficile.   ALLERGIES: CODEINE, SULFA, TETRACYCLINE, METALS.   PAST MEDICAL HISTORY: Positive for hypertension and migraine headaches.   FAMILY HISTORY: Father had bladder cancer. Mother with pancreatic cancer.  USUAL MEDICATIONS Ambien 1 tablet p.o. daily as needed for sleep, calcium with vitamin D, Benefiber as needed. The patient was on Augmentin and then finished a course of Unasyn. Cyclobenzaprine 5 mg p.o. daily as needed, Dulera 2 puffs inhaled twice a day, Duloxetine 60 mg a day, fish oil once a day, hydrochlorothiazide losartan 25/100 at 1 day, meclizine 25 mg daily as needed, MiraLax p.r.n., Zyrtec 1 tablet a day.   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  She has bouts of diarrhea now and then, but not bad enough to treat. She has a history of bronchiectasis, which is better.  No chest pain, no wheezing.    PHYSICAL EXAMINATION:  GENERAL: Elderly white female in no acute distress pleasant, good historian.  VITAL SIGNS: Temperature 98.1, pulse 85, respirations 20, blood pressure 139/72, pulse oximetry 94% room air. The left eyelid has some erythema and a little swelling in it. The right looks good.   CHEST: Clear.  HEART: Regular rhythm.  ABDOMEN: Minimal tenderness, left lower quadrant in low epigastric area.   LABORATORY DATA:  White count 5.7, hemoglobin 11.9, platelet count 190,000.   Creatinine 0.47, potassium 2.7, sodium 140, chloride 104, calcium 7.8.   ASSESSMENT: Clostridium difficile infection after taking broad-spectrum antibiotics, will recommend that she take vancomycin 250 mg p.o. q.i.d. for 14 days. We will stop the IV Flagyl at this time. We will follow her up after discharge.     ____________________________ Manya Silvas, MD rte:DT D: 10/13/2014 14:43:37 ET T: 10/13/2014 15:52:19 ET JOB#: [id number]  cc: Manya Silvas, MD, <Dictator> Tracie Harrier, MD Wyonia Hough, MD  Manya Silvas MD ELECTRONICALLY SIGNED 11/15/2014 14:08

## 2015-02-03 NOTE — Consult Note (Signed)
Pt eye looks better.  She is continuing warm compresses.  I talked to her about C. diff and cleaning surfaces in her apartment/room.  Also about spores and the possibility of relapse after antibiotic treatment.  She is to contact our office if she does have recurrent diarrhea after finishing th 14 day course of vancomycin.  VSS afebrile.  BP up a bit at 162.  May need to be in isolation at her dwelling until diarrhea stops.  Electronic Signatures: Manya Silvas (MD)  (Signed on 10-Jan-16 10:56)  Authored  Last Updated: 10-Jan-16 10:56 by Manya Silvas (MD)

## 2015-02-28 ENCOUNTER — Other Ambulatory Visit: Payer: Self-pay | Admitting: Physical Medicine and Rehabilitation

## 2015-02-28 DIAGNOSIS — M5412 Radiculopathy, cervical region: Secondary | ICD-10-CM

## 2015-03-08 ENCOUNTER — Ambulatory Visit
Admission: RE | Admit: 2015-03-08 | Discharge: 2015-03-08 | Disposition: A | Discharge status: Home / Self Care | Payer: PPO | Source: Ambulatory Visit | Attending: Physical Medicine and Rehabilitation | Admitting: Physical Medicine and Rehabilitation

## 2015-03-08 DIAGNOSIS — M2578 Osteophyte, vertebrae: Secondary | ICD-10-CM | POA: Insufficient documentation

## 2015-03-08 DIAGNOSIS — M47892 Other spondylosis, cervical region: Secondary | ICD-10-CM | POA: Insufficient documentation

## 2015-03-08 DIAGNOSIS — M79601 Pain in right arm: Secondary | ICD-10-CM | POA: Diagnosis present

## 2015-03-08 DIAGNOSIS — M5382 Other specified dorsopathies, cervical region: Secondary | ICD-10-CM | POA: Diagnosis not present

## 2015-03-08 DIAGNOSIS — Q7649 Other congenital malformations of spine, not associated with scoliosis: Secondary | ICD-10-CM | POA: Insufficient documentation

## 2015-03-08 DIAGNOSIS — M4682 Other specified inflammatory spondylopathies, cervical region: Secondary | ICD-10-CM | POA: Diagnosis not present

## 2015-03-08 DIAGNOSIS — M5412 Radiculopathy, cervical region: Secondary | ICD-10-CM

## 2015-03-08 DIAGNOSIS — M542 Cervicalgia: Secondary | ICD-10-CM | POA: Diagnosis present

## 2015-05-31 NOTE — Patient Outreach (Signed)
[place] [facility]) Care Management  05/31/2015  Lindsey [name] May 31, 1936 [id number]   Referral from HTA tier list, assigned to Sherrin Daisy, [facility] for patient outreach.  [name] L. [name], [place] Care Management Assistant

## 2015-06-04 NOTE — Patient Outreach (Signed)
[place] [facility]) Care Management  06/04/2015  YEILIN ZWEBER May 25, 1936 [id number]   Referral from HTA tier 4 list, reassigned to [place], RNCM for patient outreach.  Alliyah Roesler L. Hanad Leino, [place] Care Management Assistant

## 2015-06-06 ENCOUNTER — Other Ambulatory Visit: Payer: Self-pay | Admitting: *Deleted

## 2015-06-06 NOTE — Patient Outreach (Signed)
RNCM was able to reach [name]. RNCM explained [name]. Ms. Lindsey [name] was not interested at this time. Pt lives in an assisted living community and has many needed resources through there.   Plan: Let [facility] know pt not interested.  Rutherford Limerick RN, BSN  [name] Care Management [phone])

## 2015-06-06 NOTE — Patient Outreach (Signed)
[place] [facility]) Care Management  06/06/2015  RETAL TONKINSON 10-02-1936 [id number]   Notification received from [place], [place] to close case due to patient refusing services and living in an ALF.  Iveth Heidemann L. Kien Mirsky, [place] Care Management Assistant

## 2015-08-01 ENCOUNTER — Encounter: Payer: Self-pay | Admitting: *Deleted

## 2015-08-02 ENCOUNTER — Encounter: Payer: Self-pay | Admitting: Anesthesiology

## 2015-08-02 ENCOUNTER — Ambulatory Visit: Payer: PPO | Admitting: Anesthesiology

## 2015-08-02 ENCOUNTER — Encounter
Admission: RE | Disposition: A | Discharge status: Home / Self Care | Payer: Self-pay | Source: Ambulatory Visit | Attending: Unknown Physician Specialty

## 2015-08-02 ENCOUNTER — Ambulatory Visit
Admission: RE | Admit: 2015-08-02 | Discharge: 2015-08-02 | Disposition: A | Discharge status: Home / Self Care | Payer: PPO | Source: Ambulatory Visit | Attending: Unknown Physician Specialty | Admitting: Unknown Physician Specialty

## 2015-08-02 DIAGNOSIS — I10 Essential (primary) hypertension: Secondary | ICD-10-CM | POA: Insufficient documentation

## 2015-08-02 DIAGNOSIS — K640 First degree hemorrhoids: Secondary | ICD-10-CM | POA: Diagnosis not present

## 2015-08-02 DIAGNOSIS — M1990 Unspecified osteoarthritis, unspecified site: Secondary | ICD-10-CM | POA: Insufficient documentation

## 2015-08-02 DIAGNOSIS — K529 Noninfective gastroenteritis and colitis, unspecified: Secondary | ICD-10-CM | POA: Diagnosis present

## 2015-08-02 DIAGNOSIS — J449 Chronic obstructive pulmonary disease, unspecified: Secondary | ICD-10-CM | POA: Diagnosis not present

## 2015-08-02 DIAGNOSIS — Z853 Personal history of malignant neoplasm of breast: Secondary | ICD-10-CM | POA: Insufficient documentation

## 2015-08-02 DIAGNOSIS — D123 Benign neoplasm of transverse colon: Secondary | ICD-10-CM | POA: Diagnosis not present

## 2015-08-02 DIAGNOSIS — Z86718 Personal history of other venous thrombosis and embolism: Secondary | ICD-10-CM | POA: Insufficient documentation

## 2015-08-02 DIAGNOSIS — K5730 Diverticulosis of large intestine without perforation or abscess without bleeding: Secondary | ICD-10-CM | POA: Insufficient documentation

## 2015-08-02 DIAGNOSIS — M797 Fibromyalgia: Secondary | ICD-10-CM | POA: Insufficient documentation

## 2015-08-02 HISTORY — PX: COLONOSCOPY WITH PROPOFOL: SHX5780

## 2015-08-02 HISTORY — DX: Anemia, unspecified: D64.9

## 2015-08-02 HISTORY — DX: Malignant (primary) neoplasm, unspecified: C80.1

## 2015-08-02 HISTORY — DX: Pneumonia, unspecified organism: J18.9

## 2015-08-02 HISTORY — DX: Noninfective gastroenteritis and colitis, unspecified: K52.9

## 2015-08-02 HISTORY — DX: Unspecified asthma, uncomplicated: J45.909

## 2015-08-02 HISTORY — DX: Depression, unspecified: F32.A

## 2015-08-02 HISTORY — DX: Bronchiectasis, uncomplicated: J47.9

## 2015-08-02 HISTORY — DX: Acute poliomyelitis, unspecified: A80.9

## 2015-08-02 HISTORY — DX: Malignant neoplasm of unspecified site of unspecified female breast: C50.919

## 2015-08-02 HISTORY — DX: Chronic obstructive pulmonary disease, unspecified: J44.9

## 2015-08-02 HISTORY — DX: Major depressive disorder, single episode, unspecified: F32.9

## 2015-08-02 SURGERY — COLONOSCOPY WITH PROPOFOL
Anesthesia: General

## 2015-08-02 MED ORDER — PROPOFOL 500 MG/50ML IV EMUL
INTRAVENOUS | Status: DC | PRN
  Administered 2015-08-02: 140 ug/kg/min via INTRAVENOUS

## 2015-08-02 MED ORDER — SODIUM CHLORIDE 0.9 % IV SOLN
INTRAVENOUS | Status: DC
  Administered 2015-08-02 (×2): via INTRAVENOUS

## 2015-08-02 MED ORDER — LIDOCAINE HCL (CARDIAC) 20 MG/ML IV SOLN
INTRAVENOUS | Status: DC | PRN
  Administered 2015-08-02: 30 mg via INTRAVENOUS

## 2015-08-02 MED ORDER — FENTANYL CITRATE (PF) 100 MCG/2ML IJ SOLN
INTRAMUSCULAR | Status: DC | PRN
  Administered 2015-08-02: 50 ug via INTRAVENOUS

## 2015-08-02 MED ORDER — PROPOFOL 10 MG/ML IV BOLUS
INTRAVENOUS | Status: DC | PRN
  Administered 2015-08-02: 50 mg via INTRAVENOUS

## 2015-08-02 MED ORDER — SODIUM CHLORIDE 0.9 % IV SOLN: INTRAVENOUS | Status: DC

## 2015-08-02 MED ORDER — MIDAZOLAM HCL 5 MG/5ML IJ SOLN
INTRAMUSCULAR | Status: DC | PRN
  Administered 2015-08-02: 1 mg via INTRAVENOUS

## 2015-08-02 NOTE — Anesthesia Preprocedure Evaluation (Signed)
Anesthesia Evaluation  Patient identified by MRN, date of birth, ID band Patient awake    Reviewed: Allergy & Precautions, H&P , NPO status , Patient's Chart, lab work & pertinent test results  History of Anesthesia Complications Negative for: history of anesthetic complications  Airway Mallampati: II  TM Distance: >3 FB Neck ROM: full    Dental no notable dental hx. (+) Teeth Intact   Pulmonary neg shortness of breath, asthma , pneumonia, COPD,    Pulmonary exam normal breath sounds clear to auscultation       Cardiovascular Exercise Tolerance: Good hypertension, (-) angina(-) Past MI and (-) DOE Normal cardiovascular exam Rhythm:regular Rate:Normal     Neuro/Psych  Headaches, PSYCHIATRIC DISORDERS  Neuromuscular disease    GI/Hepatic negative GI ROS, Neg liver ROS,   Endo/Other  negative endocrine ROS  Renal/GU negative Renal ROS  negative genitourinary   Musculoskeletal  (+) Arthritis , Fibromyalgia -  Abdominal   Peds  Hematology negative hematology ROS (+)   Anesthesia Other Findings Past Medical History:   Hypertension                                                 Migraine headache with aura                                  Osteoarthritis                                               Fibromyalgia                                                 DVT (deep venous thrombosis) (HCC)                             Comment:after her right knee surgery.    Anemia                                                       Asthma                                                       Cancer ([place])                                                 Breast cancer ([place])  Bronchiectasis ([place])                            06/2007       COPD (chronic obstructive pulmonary disease) (*              Depression                                                   Fibromyalgia                                                  Polio                                           1952         Pneumonia                                                    Colitis                                                     Past Surgical History:   UMBILICAL HERNIA REPAIR                                       VAGINAL HYSTERECTOMY                                          TONSILLECTOMY                                                 REPLACEMENT TOTAL KNEE                                          Comment:right knee    FINGER TENDON REPAIR                                          BILATERAL TOTAL MASTECTOMY WITH AXILLARY LYMPH*               CARPAL TUNNEL RELEASE  Comment:bilateral    ABDOMINAL HYSTERECTOMY                                        HERNIA REPAIR                                                BMI    Body Mass Index   21.79 kg/m 2      Reproductive/Obstetrics negative OB ROS                             Anesthesia Physical Anesthesia Plan  ASA: III  Anesthesia Plan: General   Post-op Pain Management:    Induction:   Airway Management Planned:   Additional Equipment:   Intra-op Plan:   Post-operative Plan:   Informed Consent: I have reviewed the patients History and Physical, chart, labs and discussed the procedure including the risks, benefits and alternatives for the proposed anesthesia with the patient or authorized representative who has indicated his/her understanding and acceptance.   Dental Advisory Given  Plan Discussed with: Anesthesiologist, CRNA and Surgeon  Anesthesia Plan Comments:         Anesthesia Quick Evaluation

## 2015-08-02 NOTE — H&P (Signed)
Primary Care Physician:  Tracie Harrier, MD Primary Gastroenterologist:  Dr. Vira Agar  Pre-Procedure History & Physical: HPI:  Lindsey [name] is a 79 y.o. female is here for an colonoscopy.   Past Medical History  Diagnosis Date  . Hypertension   . Migraine headache with aura   . Osteoarthritis   . Fibromyalgia   . DVT (deep venous thrombosis) ([place])     after her right knee surgery.   . Anemia   . Asthma   . Cancer ([place])   . Breast cancer ([place])   . Bronchiectasis ([place]) 06/2007  . COPD (chronic obstructive pulmonary disease) ([place])   . Depression   . Fibromyalgia   . Polio 1952  . Pneumonia   . Colitis     Past Surgical History  Procedure Laterality Date  . Umbilical hernia repair    . Vaginal hysterectomy    . Tonsillectomy    . Replacement total knee      right knee   . Finger tendon repair    . Bilateral total mastectomy with axillary lymph node dissection    . Carpal tunnel release      bilateral   . Abdominal hysterectomy    . Hernia repair      Prior to Admission medications   Medication Sig Start Date End Date Taking? Authorizing Provider  budesonide (ENTOCORT EC) 3 MG 24 hr capsule Take 9 mg by mouth daily.   Yes Historical Provider, MD  cholestyramine Lucrezia Starch) 4 G packet Take 4 g by mouth 4 (four) times daily.   Yes Historical Provider, MD  cyclobenzaprine (FLEXERIL) 5 MG tablet Take 5 mg by mouth at bedtime.   Yes Historical Provider, MD  diphenhydrAMINE (BENADRYL) 25 mg capsule Take 25 mg by mouth every 6 (six) hours as needed.   Yes Historical Provider, MD  DULoxetine (CYMBALTA) 60 MG capsule Take 60 mg by mouth daily.   Yes Historical Provider, MD  hydrALAZINE (APRESOLINE) 25 MG tablet Take 25 mg by mouth 2 times daily at 12 noon and 4 pm.   Yes Historical Provider, MD  losartan (COZAAR) 100 MG tablet Take 100 mg by mouth daily.   Yes Historical Provider, MD  losartan-hydrochlorothiazide (HYZAAR) 100-25 MG per tablet Take 1 tablet by mouth daily.    Yes Historical Provider, MD  terconazole (TERAZOL 7) 0.4 % vaginal cream Place 1 applicator vaginally at bedtime.   Yes Historical Provider, MD  Calcium Carbonate-Vitamin D (CALCIUM 500 + D PO) Take 1,000 mcg by mouth daily.    Historical Provider, MD  mometasone-formoterol (DULERA) 100-5 MCG/ACT AERO Inhale 2 puffs into the lungs 2 (two) times daily.    Historical Provider, MD  Omega-3 Fatty Acids (FISH OIL) 1000 MG CAPS Take 1,000 mg by mouth daily.    Historical Provider, MD  Probiotic Product (PRO-BIOTIC BLEND) CAPS Take by mouth daily.    Historical Provider, MD  rizatriptan (MAXALT) 10 MG tablet Take 10 mg by mouth as needed for migraine. May repeat in 2 hours if needed    Historical Provider, MD    Allergies as of 07/09/2015 - Review Complete 04/30/2014  Allergen Reaction Noted  . Ivp dye [iodinated diagnostic agents] Anaphylaxis 04/30/2014  . Codeine  04/30/2014  . Sulfa antibiotics Hives 04/30/2014  . Tetracyclines & related  04/30/2014    Family History  Problem Relation Age of Onset  . Family history unknown: Yes    Social History   Social History  . Marital Status: Widowed  Spouse Name: N/A  . Number of Children: N/A  . Years of Education: N/A   Occupational History  . Not on file.   Social History Main Topics  . Smoking status: Never Smoker   . Smokeless tobacco: Never Used  . Alcohol Use: 0.6 oz/week    1 Glasses of wine per week  . Drug Use: No  . Sexual Activity: Not on file   Other Topics Concern  . Not on file   Social History Narrative    Review of Systems: See HPI, otherwise negative ROS  Physical Exam: BP 167/80 mmHg  Pulse 61  Temp(Src) 97.9 F (36.6 C) (Oral)  Resp 17  Ht 5\' 3"  (1.6 m)  Wt 55.792 kg (123 lb)  BMI 21.79 kg/m2  SpO2 99% General:   Alert,  pleasant and cooperative in NAD Head:  Normocephalic and atraumatic. Neck:  Supple; no masses or thyromegaly. Lungs:  Clear throughout to auscultation.    Heart:  Regular rate  and rhythm. Abdomen:  Soft, nontender and nondistended. Normal bowel sounds, without guarding, and without rebound.   Neurologic:  Alert and  oriented x4;  grossly normal neurologically.  Impression/Plan: Lindsey [name] is here for an colonoscopy to be performed for diarrhea  Risks, benefits, limitations, and alternatives regarding  colonoscopy have been reviewed with the patient.  Questions have been answered.  All parties agreeable.   Gaylyn Cheers, MD  08/02/2015, 8:27 AM

## 2015-08-02 NOTE — Anesthesia Postprocedure Evaluation (Signed)
  Anesthesia Post-op Note  Patient: Lindsey [name]  Procedure(s) Performed: Procedure(s): COLONOSCOPY WITH PROPOFOL (N/A)  Anesthesia type:General  Patient location: PACU  Post pain: Pain level controlled  Post assessment: Post-op Vital signs reviewed, Patient's Cardiovascular Status Stable, Respiratory Function Stable, Patent Airway and No signs of Nausea or vomiting  Post vital signs: Reviewed and stable  Last Vitals:  Filed Vitals:   08/02/15 0930  BP: 171/79  Pulse: 56  Temp:   Resp: 13    Level of consciousness: awake, alert  and patient cooperative  Complications: No apparent anesthesia complications

## 2015-08-02 NOTE — Transfer of Care (Signed)
Immediate Anesthesia Transfer of Care Note  Patient: [place]  Procedure(s) Performed: Procedure(s): COLONOSCOPY WITH PROPOFOL (N/A)  Patient Location: PACU and Short Stay  Anesthesia Type:General  Level of Consciousness: awake  Airway & Oxygen Therapy: Patient Spontanous Breathing and Patient connected to nasal cannula oxygen  Post-op Assessment: Report given to RN and Post -op Vital signs reviewed and stable  Post vital signs: Reviewed and stable  Last Vitals:  Filed Vitals:   08/02/15 0902  BP: 125/61  Pulse: 58  Temp: 36 C  Resp:     Complications: No apparent anesthesia complications

## 2015-08-02 NOTE — Op Note (Signed)
[facility] Gastroenterology Patient Name: [name] Procedure Date: 08/02/2015 8:28 AM MRN: [id number] Account #: [id number] Date of Birth: 03/17/1936 Admit Type: Outpatient Age: 79 Room: [facility] ENDO ROOM 1 Gender: Female Note Status: Finalized Procedure:         Colonoscopy Indications:       Chronic diarrhea Providers:         Manya Silvas, MD Referring MD:      Tracie Harrier, MD (Referring MD) Medicines:         Propofol per Anesthesia Complications:     No immediate complications. Procedure:         Pre-Anesthesia Assessment:                    - After reviewing the risks and benefits, the patient was                     deemed in satisfactory condition to undergo the procedure.                    After obtaining informed consent, the colonoscope was                     passed under direct vision. Throughout the procedure, the                     patient's blood pressure, pulse, and oxygen saturations                     were monitored continuously. The Colonoscope was                     introduced through the anus and advanced to the the cecum,                     identified by appendiceal orifice and ileocecal valve. The                     colonoscopy was performed without difficulty. The patient                     tolerated the procedure well. The quality of the bowel                     preparation was good. Findings:      A few small-mouthed diverticula were found in the sigmoid colon.      Internal hemorrhoids were found during endoscopy. The hemorrhoids were       small and Grade I (internal hemorrhoids that do not prolapse).      A diminutive polyp was found in the transverse colon. The polyp was       sessile. The polyp was removed with a cold biopsy forceps. Resection and       retrieval were complete.      The mucosa of the colon was normal in appearence and biopsies done of       ascending, transverse, descending and sigmoid  colon for chronic diarrhea. Impression:        - Diverticulosis in the sigmoid colon.                    - Internal hemorrhoids.                    - One diminutive polyp in the transverse  colon. Resected                     and retrieved. Recommendation:    - Await pathology results. TTake Imodium as needed. Manya Silvas, MD 08/02/2015 9:00:38 AM This report has been signed electronically. Number of Addenda: 0 Note Initiated On: 08/02/2015 8:28 AM Scope Withdrawal Time: 0 hours 9 minutes 2 seconds  Total Procedure Duration: 0 hours 21 minutes 59 seconds       [facility]

## 2015-08-04 ENCOUNTER — Encounter: Payer: Self-pay | Admitting: Unknown Physician Specialty

## 2015-08-05 LAB — SURGICAL PATHOLOGY

## 2015-11-21 DIAGNOSIS — M3501 Sicca syndrome with keratoconjunctivitis: Secondary | ICD-10-CM | POA: Diagnosis not present

## 2015-12-26 DIAGNOSIS — I10 Essential (primary) hypertension: Secondary | ICD-10-CM | POA: Diagnosis not present

## 2015-12-26 DIAGNOSIS — M797 Fibromyalgia: Secondary | ICD-10-CM | POA: Diagnosis not present

## 2015-12-26 DIAGNOSIS — M4802 Spinal stenosis, cervical region: Secondary | ICD-10-CM | POA: Diagnosis not present

## 2015-12-26 DIAGNOSIS — Z0000 Encounter for general adult medical examination without abnormal findings: Secondary | ICD-10-CM | POA: Diagnosis not present

## 2015-12-26 DIAGNOSIS — M1990 Unspecified osteoarthritis, unspecified site: Secondary | ICD-10-CM | POA: Diagnosis not present

## 2015-12-26 DIAGNOSIS — M5030 Other cervical disc degeneration, unspecified cervical region: Secondary | ICD-10-CM | POA: Diagnosis not present

## 2015-12-27 DIAGNOSIS — L570 Actinic keratosis: Secondary | ICD-10-CM | POA: Diagnosis not present

## 2015-12-27 DIAGNOSIS — X32XXXA Exposure to sunlight, initial encounter: Secondary | ICD-10-CM | POA: Diagnosis not present

## 2015-12-27 DIAGNOSIS — D485 Neoplasm of uncertain behavior of skin: Secondary | ICD-10-CM | POA: Diagnosis not present

## 2015-12-27 DIAGNOSIS — L814 Other melanin hyperpigmentation: Secondary | ICD-10-CM | POA: Diagnosis not present

## 2015-12-27 DIAGNOSIS — C44219 Basal cell carcinoma of skin of left ear and external auricular canal: Secondary | ICD-10-CM | POA: Diagnosis not present

## 2016-01-02 ENCOUNTER — Other Ambulatory Visit: Payer: Self-pay | Admitting: Internal Medicine

## 2016-01-02 DIAGNOSIS — R310 Gross hematuria: Secondary | ICD-10-CM | POA: Diagnosis not present

## 2016-01-02 DIAGNOSIS — I10 Essential (primary) hypertension: Secondary | ICD-10-CM | POA: Diagnosis not present

## 2016-01-02 DIAGNOSIS — M1990 Unspecified osteoarthritis, unspecified site: Secondary | ICD-10-CM | POA: Diagnosis not present

## 2016-01-02 DIAGNOSIS — J479 Bronchiectasis, uncomplicated: Secondary | ICD-10-CM | POA: Diagnosis not present

## 2016-01-02 DIAGNOSIS — F3289 Other specified depressive episodes: Secondary | ICD-10-CM | POA: Diagnosis not present

## 2016-01-02 DIAGNOSIS — R7989 Other specified abnormal findings of blood chemistry: Secondary | ICD-10-CM

## 2016-01-02 DIAGNOSIS — M7989 Other specified soft tissue disorders: Secondary | ICD-10-CM | POA: Diagnosis not present

## 2016-01-02 DIAGNOSIS — R945 Abnormal results of liver function studies: Principal | ICD-10-CM

## 2016-01-07 ENCOUNTER — Ambulatory Visit
Admission: RE | Admit: 2016-01-07 | Discharge: 2016-01-07 | Disposition: A | Discharge status: Home / Self Care | Payer: PPO | Source: Ambulatory Visit | Attending: Internal Medicine | Admitting: Internal Medicine

## 2016-01-07 DIAGNOSIS — R945 Abnormal results of liver function studies: Secondary | ICD-10-CM

## 2016-01-07 DIAGNOSIS — R7989 Other specified abnormal findings of blood chemistry: Secondary | ICD-10-CM | POA: Diagnosis not present

## 2016-01-07 DIAGNOSIS — Z800 Family history of malignant neoplasm of digestive organs: Secondary | ICD-10-CM | POA: Diagnosis not present

## 2016-01-09 ENCOUNTER — Ambulatory Visit: Payer: PPO

## 2016-01-13 DIAGNOSIS — N390 Urinary tract infection, site not specified: Secondary | ICD-10-CM | POA: Diagnosis not present

## 2016-01-14 DIAGNOSIS — N390 Urinary tract infection, site not specified: Secondary | ICD-10-CM | POA: Diagnosis not present

## 2016-01-15 ENCOUNTER — Other Ambulatory Visit: Payer: Self-pay | Admitting: Internal Medicine

## 2016-01-15 DIAGNOSIS — N390 Urinary tract infection, site not specified: Secondary | ICD-10-CM | POA: Diagnosis not present

## 2016-01-16 ENCOUNTER — Other Ambulatory Visit: Payer: Self-pay | Admitting: Internal Medicine

## 2016-01-16 DIAGNOSIS — K838 Other specified diseases of biliary tract: Secondary | ICD-10-CM

## 2016-01-28 ENCOUNTER — Encounter: Payer: Self-pay | Admitting: *Deleted

## 2016-01-28 ENCOUNTER — Other Ambulatory Visit: Payer: Self-pay | Admitting: *Deleted

## 2016-01-28 NOTE — Patient Outreach (Signed)
[facility]) Care Management  01/28/2016  NAYSHA TUFFY 07-16-1936 [id number]   Subjective: Telephone call to patient's home number, spoke with patient, and HIPAA verified.  Discussed [facility] Care Management services and patient in agreement to complete telephone screen. States she is doing well, has received alternative treatment for her migraines (stencil) and feels much better.  States she used to wake up with a headache everyday and has not had a headache since she had the treatment.   Patient states she does not have any disease management, disease education, care coordination, or community resource needs at this time.  States the only thing she needs assistance with is getting acces to her [place] Mychart account.  RNCM patient is currently listed as active with Mychart and may need to have password reset.    RNCM advised will follow with [facility] IT department and call patient back with an update on how to get password reset.   Telephone call to [facility] [place], regarding patient's reset request and RNCM was advised on steps patient would need to take for the password reset.   Telephone to patient's home number, spoke with patient, and HIPAA verified.  RNCM advised patient of steps to take for reset and verbally walked patient through the process over the phone.  Patient voiced appreciation and is in agreement to receive [place] Management program information for future reference.   Objective: Per chart review, patient has declined [place] Management services on 9/1/6 due to no care management needs.   Assessment: Received [facility] Tier 4 list referral on 01/21/16.   0 Admissions and 2 ER visits.   No diagnosis listed.    Telephone screen completion and no care management needs at this time.   Plan:  RNCM will send patient successful outreach letter, [facility] pamphlet, and magnet. RNCM will send patient's primary MD case closure letter due to patient refusal/ no care  management needs.  RNCM will send case closure due to no care management needs request to Josepha Pigg at [place] Management.    Foday Cone H. Annia Friendly, BSN, [place] [facility] Telephonic CM Phone: [phone] Fax: [phone]

## 2016-01-29 DIAGNOSIS — H35711 Central serous chorioretinopathy, right eye: Secondary | ICD-10-CM | POA: Diagnosis not present

## 2016-02-11 ENCOUNTER — Ambulatory Visit
Admission: RE | Admit: 2016-02-11 | Discharge: 2016-02-11 | Disposition: A | Discharge status: Home / Self Care | Payer: PPO | Source: Ambulatory Visit | Attending: Internal Medicine | Admitting: Internal Medicine

## 2016-02-11 DIAGNOSIS — R935 Abnormal findings on diagnostic imaging of other abdominal regions, including retroperitoneum: Secondary | ICD-10-CM | POA: Diagnosis not present

## 2016-02-11 DIAGNOSIS — R109 Unspecified abdominal pain: Secondary | ICD-10-CM | POA: Diagnosis not present

## 2016-02-11 DIAGNOSIS — K838 Other specified diseases of biliary tract: Secondary | ICD-10-CM | POA: Insufficient documentation

## 2016-02-13 DIAGNOSIS — M1712 Unilateral primary osteoarthritis, left knee: Secondary | ICD-10-CM | POA: Diagnosis not present

## 2016-02-13 DIAGNOSIS — M7062 Trochanteric bursitis, left hip: Secondary | ICD-10-CM | POA: Diagnosis not present

## 2016-02-14 DIAGNOSIS — C44219 Basal cell carcinoma of skin of left ear and external auricular canal: Secondary | ICD-10-CM | POA: Diagnosis not present

## 2016-02-26 DIAGNOSIS — H35711 Central serous chorioretinopathy, right eye: Secondary | ICD-10-CM | POA: Diagnosis not present

## 2016-03-25 DIAGNOSIS — K648 Other hemorrhoids: Secondary | ICD-10-CM | POA: Diagnosis not present

## 2016-03-25 DIAGNOSIS — H35711 Central serous chorioretinopathy, right eye: Secondary | ICD-10-CM | POA: Diagnosis not present

## 2016-03-25 DIAGNOSIS — K52832 Lymphocytic colitis: Secondary | ICD-10-CM | POA: Diagnosis not present

## 2016-04-10 DIAGNOSIS — R1084 Generalized abdominal pain: Secondary | ICD-10-CM | POA: Diagnosis not present

## 2016-04-10 DIAGNOSIS — K5900 Constipation, unspecified: Secondary | ICD-10-CM | POA: Diagnosis not present

## 2016-04-20 DIAGNOSIS — H35711 Central serous chorioretinopathy, right eye: Secondary | ICD-10-CM | POA: Diagnosis not present

## 2016-04-24 DIAGNOSIS — M25562 Pain in left knee: Secondary | ICD-10-CM | POA: Diagnosis not present

## 2016-04-24 DIAGNOSIS — M7062 Trochanteric bursitis, left hip: Secondary | ICD-10-CM | POA: Diagnosis not present

## 2016-04-24 DIAGNOSIS — M7050 Other bursitis of knee, unspecified knee: Secondary | ICD-10-CM | POA: Diagnosis not present

## 2016-05-01 DIAGNOSIS — H35711 Central serous chorioretinopathy, right eye: Secondary | ICD-10-CM | POA: Diagnosis not present

## 2016-05-05 DIAGNOSIS — H43813 Vitreous degeneration, bilateral: Secondary | ICD-10-CM | POA: Diagnosis not present

## 2016-05-05 DIAGNOSIS — H35711 Central serous chorioretinopathy, right eye: Secondary | ICD-10-CM | POA: Diagnosis not present

## 2016-05-05 DIAGNOSIS — H35051 Retinal neovascularization, unspecified, right eye: Secondary | ICD-10-CM | POA: Diagnosis not present

## 2016-05-14 DIAGNOSIS — F3289 Other specified depressive episodes: Secondary | ICD-10-CM | POA: Diagnosis not present

## 2016-05-14 DIAGNOSIS — M797 Fibromyalgia: Secondary | ICD-10-CM | POA: Diagnosis not present

## 2016-05-14 DIAGNOSIS — N390 Urinary tract infection, site not specified: Secondary | ICD-10-CM | POA: Diagnosis not present

## 2016-05-14 DIAGNOSIS — J479 Bronchiectasis, uncomplicated: Secondary | ICD-10-CM | POA: Diagnosis not present

## 2016-05-14 DIAGNOSIS — I10 Essential (primary) hypertension: Secondary | ICD-10-CM | POA: Diagnosis not present

## 2016-05-14 DIAGNOSIS — R319 Hematuria, unspecified: Secondary | ICD-10-CM | POA: Diagnosis not present

## 2016-05-14 DIAGNOSIS — Z780 Asymptomatic menopausal state: Secondary | ICD-10-CM | POA: Diagnosis not present

## 2016-05-14 DIAGNOSIS — R339 Retention of urine, unspecified: Secondary | ICD-10-CM | POA: Diagnosis not present

## 2016-05-17 DIAGNOSIS — N390 Urinary tract infection, site not specified: Secondary | ICD-10-CM | POA: Diagnosis not present

## 2016-05-26 DIAGNOSIS — Z780 Asymptomatic menopausal state: Secondary | ICD-10-CM | POA: Diagnosis not present

## 2016-05-27 DIAGNOSIS — M7989 Other specified soft tissue disorders: Secondary | ICD-10-CM | POA: Diagnosis not present

## 2016-05-27 DIAGNOSIS — R310 Gross hematuria: Secondary | ICD-10-CM | POA: Diagnosis not present

## 2016-05-27 DIAGNOSIS — J479 Bronchiectasis, uncomplicated: Secondary | ICD-10-CM | POA: Diagnosis not present

## 2016-05-27 DIAGNOSIS — F3289 Other specified depressive episodes: Secondary | ICD-10-CM | POA: Diagnosis not present

## 2016-05-27 DIAGNOSIS — I10 Essential (primary) hypertension: Secondary | ICD-10-CM | POA: Diagnosis not present

## 2016-05-27 DIAGNOSIS — M1990 Unspecified osteoarthritis, unspecified site: Secondary | ICD-10-CM | POA: Diagnosis not present

## 2016-05-27 DIAGNOSIS — R7989 Other specified abnormal findings of blood chemistry: Secondary | ICD-10-CM | POA: Diagnosis not present

## 2016-06-03 ENCOUNTER — Other Ambulatory Visit: Payer: Self-pay | Admitting: Internal Medicine

## 2016-06-03 DIAGNOSIS — R0609 Other forms of dyspnea: Principal | ICD-10-CM

## 2016-06-03 DIAGNOSIS — M1990 Unspecified osteoarthritis, unspecified site: Secondary | ICD-10-CM | POA: Diagnosis not present

## 2016-06-03 DIAGNOSIS — L299 Pruritus, unspecified: Secondary | ICD-10-CM | POA: Diagnosis not present

## 2016-06-03 DIAGNOSIS — I10 Essential (primary) hypertension: Secondary | ICD-10-CM | POA: Diagnosis not present

## 2016-06-03 DIAGNOSIS — J479 Bronchiectasis, uncomplicated: Secondary | ICD-10-CM

## 2016-06-04 DIAGNOSIS — L821 Other seborrheic keratosis: Secondary | ICD-10-CM | POA: Diagnosis not present

## 2016-06-04 DIAGNOSIS — D1801 Hemangioma of skin and subcutaneous tissue: Secondary | ICD-10-CM | POA: Diagnosis not present

## 2016-06-04 DIAGNOSIS — L84 Corns and callosities: Secondary | ICD-10-CM | POA: Diagnosis not present

## 2016-06-04 DIAGNOSIS — L708 Other acne: Secondary | ICD-10-CM | POA: Diagnosis not present

## 2016-06-09 DIAGNOSIS — H43813 Vitreous degeneration, bilateral: Secondary | ICD-10-CM | POA: Diagnosis not present

## 2016-06-09 DIAGNOSIS — H35051 Retinal neovascularization, unspecified, right eye: Secondary | ICD-10-CM | POA: Diagnosis not present

## 2016-06-09 DIAGNOSIS — H43393 Other vitreous opacities, bilateral: Secondary | ICD-10-CM | POA: Diagnosis not present

## 2016-06-09 DIAGNOSIS — H35711 Central serous chorioretinopathy, right eye: Secondary | ICD-10-CM | POA: Diagnosis not present

## 2016-06-10 DIAGNOSIS — Z8709 Personal history of other diseases of the respiratory system: Secondary | ICD-10-CM | POA: Diagnosis not present

## 2016-06-16 ENCOUNTER — Ambulatory Visit
Admission: RE | Admit: 2016-06-16 | Discharge: 2016-06-16 | Disposition: A | Discharge status: Home / Self Care | Payer: PPO | Source: Ambulatory Visit | Attending: Internal Medicine | Admitting: Internal Medicine

## 2016-06-16 DIAGNOSIS — J479 Bronchiectasis, uncomplicated: Secondary | ICD-10-CM

## 2016-06-16 DIAGNOSIS — I2510 Atherosclerotic heart disease of native coronary artery without angina pectoris: Secondary | ICD-10-CM | POA: Diagnosis not present

## 2016-06-16 DIAGNOSIS — R0609 Other forms of dyspnea: Secondary | ICD-10-CM | POA: Insufficient documentation

## 2016-06-16 DIAGNOSIS — I517 Cardiomegaly: Secondary | ICD-10-CM | POA: Insufficient documentation

## 2016-06-16 LAB — POCT I-STAT CREATININE: Creatinine, Ser: 0.7 mg/dL (ref 0.44–1.00)

## 2016-06-16 MED ORDER — IOPAMIDOL (ISOVUE-300) INJECTION 61%
75.0000 mL | Freq: Once | INTRAVENOUS | Status: AC | PRN
  Administered 2016-06-16: 75 mL via INTRAVENOUS

## 2016-07-10 DIAGNOSIS — H35711 Central serous chorioretinopathy, right eye: Secondary | ICD-10-CM | POA: Diagnosis not present

## 2016-07-13 DIAGNOSIS — K625 Hemorrhage of anus and rectum: Secondary | ICD-10-CM | POA: Diagnosis not present

## 2016-07-13 DIAGNOSIS — K5900 Constipation, unspecified: Secondary | ICD-10-CM | POA: Diagnosis not present

## 2016-08-14 DIAGNOSIS — H35711 Central serous chorioretinopathy, right eye: Secondary | ICD-10-CM | POA: Diagnosis not present

## 2016-09-23 DIAGNOSIS — H43393 Other vitreous opacities, bilateral: Secondary | ICD-10-CM | POA: Diagnosis not present

## 2016-09-23 DIAGNOSIS — H35051 Retinal neovascularization, unspecified, right eye: Secondary | ICD-10-CM | POA: Diagnosis not present

## 2016-09-23 DIAGNOSIS — H43811 Vitreous degeneration, right eye: Secondary | ICD-10-CM | POA: Diagnosis not present

## 2016-10-09 DIAGNOSIS — J479 Bronchiectasis, uncomplicated: Secondary | ICD-10-CM | POA: Diagnosis not present

## 2016-10-09 DIAGNOSIS — I10 Essential (primary) hypertension: Secondary | ICD-10-CM | POA: Diagnosis not present

## 2016-10-09 DIAGNOSIS — R0609 Other forms of dyspnea: Secondary | ICD-10-CM | POA: Diagnosis not present

## 2016-10-09 DIAGNOSIS — M1990 Unspecified osteoarthritis, unspecified site: Secondary | ICD-10-CM | POA: Diagnosis not present

## 2016-10-14 DIAGNOSIS — L299 Pruritus, unspecified: Secondary | ICD-10-CM | POA: Diagnosis not present

## 2016-10-14 DIAGNOSIS — M797 Fibromyalgia: Secondary | ICD-10-CM | POA: Diagnosis not present

## 2016-10-14 DIAGNOSIS — I10 Essential (primary) hypertension: Secondary | ICD-10-CM | POA: Diagnosis not present

## 2016-10-14 DIAGNOSIS — N907 Vulvar cyst: Secondary | ICD-10-CM | POA: Diagnosis not present

## 2016-10-14 DIAGNOSIS — J479 Bronchiectasis, uncomplicated: Secondary | ICD-10-CM | POA: Diagnosis not present

## 2016-10-14 DIAGNOSIS — F3289 Other specified depressive episodes: Secondary | ICD-10-CM | POA: Diagnosis not present

## 2016-10-14 DIAGNOSIS — G43709 Chronic migraine without aura, not intractable, without status migrainosus: Secondary | ICD-10-CM | POA: Diagnosis not present

## 2016-10-29 DIAGNOSIS — N907 Vulvar cyst: Secondary | ICD-10-CM | POA: Diagnosis not present

## 2016-11-13 DIAGNOSIS — H35711 Central serous chorioretinopathy, right eye: Secondary | ICD-10-CM | POA: Diagnosis not present

## 2016-12-02 DIAGNOSIS — L0889 Other specified local infections of the skin and subcutaneous tissue: Secondary | ICD-10-CM | POA: Diagnosis not present

## 2016-12-02 DIAGNOSIS — Z08 Encounter for follow-up examination after completed treatment for malignant neoplasm: Secondary | ICD-10-CM | POA: Diagnosis not present

## 2016-12-02 DIAGNOSIS — B078 Other viral warts: Secondary | ICD-10-CM | POA: Diagnosis not present

## 2016-12-02 DIAGNOSIS — Z85828 Personal history of other malignant neoplasm of skin: Secondary | ICD-10-CM | POA: Diagnosis not present

## 2016-12-25 DIAGNOSIS — H35711 Central serous chorioretinopathy, right eye: Secondary | ICD-10-CM | POA: Diagnosis not present

## 2017-02-05 DIAGNOSIS — N907 Vulvar cyst: Secondary | ICD-10-CM | POA: Diagnosis not present

## 2017-02-05 DIAGNOSIS — G43709 Chronic migraine without aura, not intractable, without status migrainosus: Secondary | ICD-10-CM | POA: Diagnosis not present

## 2017-02-05 DIAGNOSIS — H35711 Central serous chorioretinopathy, right eye: Secondary | ICD-10-CM | POA: Diagnosis not present

## 2017-02-05 DIAGNOSIS — M797 Fibromyalgia: Secondary | ICD-10-CM | POA: Diagnosis not present

## 2017-02-05 DIAGNOSIS — J479 Bronchiectasis, uncomplicated: Secondary | ICD-10-CM | POA: Diagnosis not present

## 2017-02-05 DIAGNOSIS — L299 Pruritus, unspecified: Secondary | ICD-10-CM | POA: Diagnosis not present

## 2017-02-05 DIAGNOSIS — F3289 Other specified depressive episodes: Secondary | ICD-10-CM | POA: Diagnosis not present

## 2017-02-05 DIAGNOSIS — I10 Essential (primary) hypertension: Secondary | ICD-10-CM | POA: Diagnosis not present

## 2017-02-11 DIAGNOSIS — I10 Essential (primary) hypertension: Secondary | ICD-10-CM | POA: Diagnosis not present

## 2017-02-11 DIAGNOSIS — M797 Fibromyalgia: Secondary | ICD-10-CM | POA: Diagnosis not present

## 2017-02-11 DIAGNOSIS — M5030 Other cervical disc degeneration, unspecified cervical region: Secondary | ICD-10-CM | POA: Diagnosis not present

## 2017-02-11 DIAGNOSIS — Z0000 Encounter for general adult medical examination without abnormal findings: Secondary | ICD-10-CM | POA: Diagnosis not present

## 2017-02-11 DIAGNOSIS — J479 Bronchiectasis, uncomplicated: Secondary | ICD-10-CM | POA: Diagnosis not present

## 2017-03-31 ENCOUNTER — Emergency Department: Payer: PPO

## 2017-03-31 ENCOUNTER — Encounter: Payer: Self-pay | Admitting: Emergency Medicine

## 2017-03-31 DIAGNOSIS — I10 Essential (primary) hypertension: Secondary | ICD-10-CM | POA: Insufficient documentation

## 2017-03-31 DIAGNOSIS — J449 Chronic obstructive pulmonary disease, unspecified: Secondary | ICD-10-CM | POA: Insufficient documentation

## 2017-03-31 DIAGNOSIS — R0602 Shortness of breath: Secondary | ICD-10-CM | POA: Diagnosis not present

## 2017-03-31 DIAGNOSIS — Z79899 Other long term (current) drug therapy: Secondary | ICD-10-CM | POA: Diagnosis not present

## 2017-03-31 DIAGNOSIS — Z7951 Long term (current) use of inhaled steroids: Secondary | ICD-10-CM | POA: Diagnosis not present

## 2017-03-31 DIAGNOSIS — R42 Dizziness and giddiness: Secondary | ICD-10-CM | POA: Diagnosis not present

## 2017-03-31 DIAGNOSIS — J45909 Unspecified asthma, uncomplicated: Secondary | ICD-10-CM | POA: Insufficient documentation

## 2017-03-31 LAB — BASIC METABOLIC PANEL
Anion gap: 9 (ref 5–15)
BUN: 21 mg/dL — ABNORMAL HIGH (ref 6–20)
CO2: 26 mmol/L (ref 22–32)
Calcium: 9.1 mg/dL (ref 8.9–10.3)
Chloride: 99 mmol/L — ABNORMAL LOW (ref 101–111)
Creatinine, Ser: 0.59 mg/dL (ref 0.44–1.00)
GFR calc Af Amer: >60 mL/min (ref >60)
GFR calc non Af Amer: >60 mL/min (ref >60)
Glucose, Bld: 119 mg/dL — ABNORMAL HIGH (ref 65–99)
Potassium: 3.2 mmol/L — ABNORMAL LOW (ref 3.5–5.1)
Sodium: 134 mmol/L — ABNORMAL LOW (ref 135–145)

## 2017-03-31 LAB — CBC
HCT: 40.8 % (ref 35.0–47.0)
Hemoglobin: 13.8 g/dL (ref 12.0–16.0)
MCH: 30.4 pg (ref 26.0–34.0)
MCHC: 33.9 g/dL (ref 32.0–36.0)
MCV: 89.7 fL (ref 80.0–100.0)
Platelets: 230 10*3/uL (ref 150–440)
RBC: 4.55 MIL/uL (ref 3.80–5.20)
RDW: 13.9 % (ref 11.5–14.5)
WBC: 5.5 10*3/uL (ref 3.6–11.0)

## 2017-03-31 LAB — TROPONIN I: Troponin I: <0.03 ng/mL (ref <0.03)

## 2017-03-31 NOTE — ED Triage Notes (Signed)
Pt to triage via w/c with no distress noted; reports onset dizziness and SHOB this evening while eating dinner

## 2017-04-01 ENCOUNTER — Emergency Department
Admission: EM | Admit: 2017-04-01 | Discharge: 2017-04-01 | Disposition: A | Discharge status: Home / Self Care | Payer: PPO | Attending: Emergency Medicine | Admitting: Emergency Medicine

## 2017-04-01 DIAGNOSIS — R42 Dizziness and giddiness: Secondary | ICD-10-CM

## 2017-04-01 NOTE — ED Notes (Signed)
Pt presents to ED 11 c/o dizziness/light headedness around "supper time"; at this time, pt denies any light headedness or dizziness, denies any pain, no visual changes, no nausea, vomiting, diarrhea,

## 2017-04-01 NOTE — ED Provider Notes (Signed)
[facility] Emergency Department Provider Note   ____________________________________________   First MD Initiated Contact with Patient 04/01/17 [phone]     (approximate)  I have reviewed the triage vital signs and the nursing notes.   HISTORY  Chief Complaint Dizziness    HPI Lindsey [name] is a 81 y.o. female who presents to the ED from [facility] assisted living facility with a chief complaint of dizziness and shortness of breath. Patient reportsdrinking an extra glass of wine with dinner tonight. States she was fine while sitting, but when she stood up she felt dizzy/lightheaded and "breathing heavily". Symptoms lasted approximately 2 hours and have now resolved. Denies associated fever, chills, chest pain, abdominal pain, nausea, vomiting, diarrhea. Denies recent travel or trauma.   Past Medical History:  Diagnosis Date  . Anemia   . Asthma   . Breast cancer ([place])   . Bronchiectasis ([place]) 06/2007  . Cancer ([place])   . Colitis   . COPD (chronic obstructive pulmonary disease) ([place])   . Depression   . DVT (deep venous thrombosis) ([place])    after her right knee surgery.   . Fibromyalgia   . Fibromyalgia   . Hypertension   . Migraine headache with aura   . Osteoarthritis   . Pneumonia   . Polio 1952    Patient Active Problem List   Diagnosis Date Noted  . SOB (shortness of breath) 04/30/2014  . Leg swelling 04/30/2014  . Bronchiectasis without complication ([place]) 05/18/4817  . History of DVT (deep vein thrombosis) 04/30/2014  . Labile blood pressure 04/30/2014  . S/P IVC filter 04/30/2014  . History of right knee surgery 04/30/2014    Past Surgical History:  Procedure Laterality Date  . ABDOMINAL HYSTERECTOMY    . BILATERAL TOTAL MASTECTOMY WITH AXILLARY LYMPH NODE DISSECTION    . CARPAL TUNNEL RELEASE     bilateral   . COLONOSCOPY WITH PROPOFOL N/A 08/02/2015   Procedure: COLONOSCOPY WITH PROPOFOL;  Surgeon: Manya Silvas, MD;  Location: [facility] ENDOSCOPY;  Service: Endoscopy;  Laterality: N/A;  . FINGER TENDON REPAIR    . HERNIA REPAIR    . REPLACEMENT TOTAL KNEE     right knee   . TONSILLECTOMY    . UMBILICAL HERNIA REPAIR    . VAGINAL HYSTERECTOMY      Prior to Admission medications   Medication Sig Start Date End Date Taking? Authorizing Provider  budesonide (ENTOCORT EC) 3 MG 24 hr capsule Take 9 mg by mouth daily.    [provider]  Calcium Carbonate-Vitamin D (CALCIUM 500 + D PO) Take 1,000 mcg by mouth daily.    [provider]  cholestyramine Lucrezia Starch) 4 G packet Take 4 g by mouth 4 (four) times daily.    [provider]  cyclobenzaprine (FLEXERIL) 5 MG tablet Take 5 mg by mouth at bedtime.    [provider]  diphenhydrAMINE (BENADRYL) 25 mg capsule Take 25 mg by mouth every 6 (six) hours as needed.    [provider]  DULoxetine (CYMBALTA) 60 MG capsule Take 60 mg by mouth daily.    [provider]  hydrALAZINE (APRESOLINE) 25 MG tablet Take 25 mg by mouth 2 times daily at 12 noon and 4 pm.    [provider]  losartan (COZAAR) 100 MG tablet Take 100 mg by mouth daily.    [provider]  losartan-hydrochlorothiazide (HYZAAR) 100-25 MG per tablet Take 1 tablet by mouth daily.  [provider]  mometasone-formoterol (DULERA) 100-5 MCG/ACT AERO Inhale 2 puffs into the lungs 2 (two) times daily.    [provider]  Omega-3 Fatty Acids (FISH OIL) 1000 MG CAPS Take 1,000 mg by mouth daily.    [provider]  Probiotic Product (PRO-BIOTIC BLEND) CAPS Take by mouth daily.    [provider]  rizatriptan (MAXALT) 10 MG tablet Take 10 mg by mouth as needed for migraine. May repeat in 2 hours if needed    [provider]  terconazole (TERAZOL 7) 0.4 % vaginal cream Place 1 applicator vaginally at bedtime.    [provider]    Allergies Ivp dye [iodinated diagnostic  agents]; Codeine; Sulfa antibiotics; Tegaderm ag mesh [silver]; and Tetracyclines & related  Family History  Problem Relation Age of Onset  . Family history unknown: Yes    Social History Social History  Substance Use Topics  . Smoking status: Never Smoker  . Smokeless tobacco: Never Used  . Alcohol use 0.6 oz/week    1 Glasses of wine per week    Review of Systems  Constitutional: No fever/chills. Eyes: No visual changes. ENT: No sore throat. Cardiovascular: Denies chest pain. Respiratory: Positive for shortness of breath. Gastrointestinal: No abdominal pain.  No nausea, no vomiting.  No diarrhea.  No constipation. Genitourinary: Negative for dysuria. Musculoskeletal: Negative for back pain. Skin: Negative for rash. Neurological: Positive for dizziness. Negative for headaches, focal weakness or numbness.   ____________________________________________   PHYSICAL EXAM:  VITAL SIGNS: ED Triage Vitals [03/31/17 2017]  Enc Vitals Group     BP 125/68     Pulse Rate 87     Resp 20     Temp 98 F (36.7 C)     Temp Source Oral     SpO2 95 %     Weight 120 lb (54.4 kg)     Height 5\' 3"  (1.6 m)     Head Circumference      Peak Flow      Pain Score      Pain Loc      Pain Edu?      Excl. in [place]?     Constitutional: Alert and oriented. Well appearing and in no acute distress. Eyes: Conjunctivae are normal. PERRL. EOMI. Head: Atraumatic. Nose: No congestion/rhinnorhea. Mouth/Throat: Mucous membranes are moist.  Oropharynx non-erythematous. Neck: No stridor.  Supple neck without meningismus.  No carotid bruits. Cardiovascular: Normal rate, regular rhythm. Grossly normal heart sounds.  Good peripheral circulation. Respiratory: Normal respiratory effort.  No retractions. Lungs CTAB. Gastrointestinal: Soft and nontender. No distention. No abdominal bruits. No CVA tenderness. Musculoskeletal: No lower extremity tenderness nor edema.  No joint effusions. Neurologic:   Alert and oriented 3. Normal speech and language. No gross focal neurologic deficits are appreciated.  Skin:  Skin is warm, dry and intact. No rash noted. Psychiatric: Mood and affect are normal. Speech and behavior are normal.  ____________________________________________   LABS (all labs ordered are listed, but only abnormal results are displayed)  Labs Reviewed  BASIC METABOLIC PANEL - Abnormal; Notable for the following:       Result Value   Sodium 134 (*)    Potassium 3.2 (*)    Chloride 99 (*)    Glucose, Bld 119 (*)    BUN 21 (*)    All other components within normal limits  CBC  TROPONIN I  URINALYSIS, COMPLETE (UACMP) WITH MICROSCOPIC  TROPONIN I   ____________________________________________  EKG  ED ECG REPORT I, Cope Marte J, the attending physician, personally viewed and interpreted this ECG.   Date: 04/01/2017  EKG Time: 2018  Rate: 76  Rhythm: normal EKG, normal sinus rhythm  Axis: LAD  Intervals:right bundle branch block  ST&T Change: Nonspecific  ____________________________________________  RADIOLOGY  Dg Chest 2 View  Result Date: 03/31/2017 CLINICAL DATA:  Acute shortness of breath EXAM: CHEST  2 VIEW COMPARISON:  06/16/2016 CT FINDINGS: Cardiomegaly again noted. There is no evidence of focal airspace disease, pulmonary edema, suspicious pulmonary nodule/mass, pleural effusion, or pneumothorax. No acute bony abnormalities are identified. IMPRESSION: Cardiomegaly without evidence of acute cardiopulmonary disease. Electronically Signed   By: Margarette Canada M.D.   On: 03/31/2017 21:02    ____________________________________________   PROCEDURES  Procedure(s) performed: None  Procedures  Critical Care performed: No  ____________________________________________   INITIAL IMPRESSION / ASSESSMENT AND PLAN / ED COURSE  Pertinent labs & imaging results that were available during my care of the patient were reviewed by me and considered in my  medical decision making (see chart for details).  81 year old female who presents with dizziness and transient shortness of breath after standing up from dinner where she drink an extra glass of wine. Voices no complaints currently. Will obtain orthostatic vital signs, check urinalysis, repeat troponin and reassess.  Clinical Course as of Apr 01 220  Thu Apr 01, 2017  0220 I was informed by the nurse that patient has eloped from the room. She was not in the lobby when called.  [JS]    Clinical Course User Index [JS] Paulette Blanch, MD     ____________________________________________   FINAL CLINICAL IMPRESSION(S) / ED DIAGNOSES  Final diagnoses:  Dizziness      NEW MEDICATIONS STARTED DURING THIS VISIT:  New Prescriptions   No medications on file     Note:  This document was prepared using Dragon voice recognition software and may include unintentional dictation errors.    Paulette Blanch, MD 04/01/17 [phone]

## 2017-04-02 DIAGNOSIS — H35711 Central serous chorioretinopathy, right eye: Secondary | ICD-10-CM | POA: Diagnosis not present

## 2017-04-14 NOTE — Progress Notes (Signed)
Cardiology Office Note  Date:  04/15/2017   ID:  WANA MOUNT, Lindsey [name] 1936-03-06, MRN [id number]  PCP:  Tracie Harrier, MD   Chief Complaint  Patient presents with  . other    Patient c/o elevated BP and SOB. Last seen in 2015. Meds reivewed verbally with patient.    Last seen 04/30/2014  HPI:  Lindsey [name] is a pleasant 81 year old woman with history of  lower extremity swelling,Secondary to venous insufficiency  prior right knee surgery,  right DVT, status post IVC filter,  bronchiectasis on inhalers and nebulizers at home  who presents for follow up of her labile blood pressure and her leg swelling.  On her visit today, she reports that she continues to have labile blood pressures Recent visit to the emergency room ER 6/28,2018 Hospital records reviewed with the patient in detail Reported having dizziness and shortness of breath. Presented relatively acutely  Was drinking extra wine ,  stood up she felt dizzy/lightheaded and "breathing heavily".  Symptoms lasted approximately 2 hours  resolved.  Workup in the emergency room was benign Lab work as detailed below discussed with her Sodium 134, potassium 3.2, BUN 21 She does not take potassium supplement on a daily basis  Reports previously tried to come off HCTZ but had leg swelling Does not like to take hydralazine as often forgetting doses Concerned as blood pressure goes up and down Does not wear compression hose  Blood pressures reviewed on her blood pressure cuff today, systolic pressure typically 130, occasional 150  Leg swelling History dates back many years but started with right knee surgery after an accident, fracture. She suffered a DVT, had IVC filter placed. Swelling worse at the end of the day, better in the morning. She does have significant varicosities.   bronchiectasis diagnosed at outside facility.  stress test may 2015 which was reportedly normal  Recently in the emergency room several weeks ago for  headache. Blood pressure that time was elevated, CT scan of the head was normal. Periodically has severe headaches  EKG shows normal sinus rhythm with rate 66 beats per minute, right bundle branch block, left anterior fascicular block   PMH:   has a past medical history of Anemia; Asthma; Breast cancer ([place]); Bronchiectasis ([place]) (06/2007); Cancer ([place]); Colitis; COPD (chronic obstructive pulmonary disease) ([place]); Depression; DVT (deep venous thrombosis) ([place]); Fibromyalgia; Fibromyalgia; Hypertension; Migraine headache with aura; Osteoarthritis; Pneumonia; and Polio (1952).  PSH:    Past Surgical History:  Procedure Laterality Date  . ABDOMINAL HYSTERECTOMY    . BILATERAL TOTAL MASTECTOMY WITH AXILLARY LYMPH NODE DISSECTION    . CARPAL TUNNEL RELEASE     bilateral   . COLONOSCOPY WITH PROPOFOL N/A 08/02/2015   Procedure: COLONOSCOPY WITH PROPOFOL;  Surgeon: Manya Silvas, MD;  Location[facility];  Service: Endoscopy;  Laterality: N/A;  . FINGER TENDON REPAIR    . HERNIA REPAIR    . REPLACEMENT TOTAL KNEE     right knee   . TONSILLECTOMY    . UMBILICAL HERNIA REPAIR    . VAGINAL HYSTERECTOMY      Current Outpatient Prescriptions  Medication Sig Dispense Refill  . budesonide (ENTOCORT EC) 3 MG 24 hr capsule Take 9 mg by mouth daily.    . Calcium Carbonate-Vitamin D (CALCIUM 500 + D PO) Take 1,000 mcg by mouth daily.    . cholestyramine (QUESTRAN) 4 G packet Take 4 g by mouth 4 (four) times daily.    . cyclobenzaprine (FLEXERIL) 5 MG tablet  Take 5 mg by mouth at bedtime.    . DULoxetine (CYMBALTA) 60 MG capsule Take 60 mg by mouth daily.    . hydrALAZINE (APRESOLINE) 25 MG tablet Take 25 mg by mouth 2 times daily at 12 noon and 4 pm.    . losartan-hydrochlorothiazide (HYZAAR) 100-25 MG per tablet Take 1 tablet by mouth daily.    . Omega-3 Fatty Acids (FISH OIL) 1000 MG CAPS Take 1,000 mg by mouth daily.    . Probiotic Product (PRO-BIOTIC BLEND) CAPS Take by mouth daily.    .  rizatriptan (MAXALT) 10 MG tablet Take 10 mg by mouth as needed for migraine. May repeat in 2 hours if needed    . SYMBICORT 160-4.5 MCG/ACT inhaler     . terconazole (TERAZOL 7) 0.4 % vaginal cream Place 1 applicator vaginally at bedtime.     No current facility-administered medications for this visit.      Allergies:   Ivp dye [iodinated diagnostic agents]; Codeine; Sulfa antibiotics; Tegaderm ag mesh [silver]; and Tetracyclines & related   Social History:  The patient  reports that she has never smoked. She has never used smokeless tobacco. She reports that she drinks about 0.6 oz of alcohol per week . She reports that she does not use drugs.   Family History:   Family history is unknown by patient.    Review of Systems: Review of Systems  Constitutional: Negative.   Respiratory: Positive for shortness of breath.   Cardiovascular: Negative.   Gastrointestinal: Negative.   Musculoskeletal: Negative.   Neurological: Negative.   Psychiatric/Behavioral: Negative.   All other systems reviewed and are negative.    PHYSICAL EXAM: VS:  BP 140/78 (BP Location: Left Arm, Patient Position: Sitting, Cuff Size: Normal)   Pulse 66   Ht 5\' 3"  (1.6 m)   Wt 125 lb 8 oz (56.9 kg)   BMI 22.23 kg/m  , BMI Body mass index is 22.23 kg/m. GEN: Well nourished, well developed, in no acute distress  HEENT: normal  Neck: no JVD, carotid bruits, or masses Cardiac: RRR; no murmurs, rubs, or gallops,no edema  Respiratory:  clear to auscultation bilaterally, normal work of breathing GI: soft, nontender, nondistended, + BS Lindsey: no deformity or atrophy  Skin: warm and dry, no rash Neuro:  Strength and sensation are intact Psych: euthymic mood, full affect    Recent Labs: 03/31/2017: BUN 21; Creatinine, Ser 0.59; Hemoglobin 13.8; Platelets 230; Potassium 3.2; Sodium 134    Lipid Panel No results found for: CHOL, HDL, LDLCALC, TRIG    Wt Readings from Last 3 Encounters:  04/15/17 125 lb 8 oz  (56.9 kg)  03/31/17 120 lb (54.4 kg)  08/02/15 123 lb (55.8 kg)       ASSESSMENT AND PLAN:  Labile blood pressure Long discussion concerning her blood pressure She would like to come off HCTZ We did discuss some of her recent electrolyte abnormalities Recommended we continue losartan 100 mg daily Suggested she take HCTZ 25 mg daily when necessary for leg swelling We will avoid calcium channel blockers She would like to stop the hydralazine as she is missing doses, prefers a once a day pill Options include clonidine, isosorbide, Cardura , again she prefers a once a day pill Suggested she try Cardura 4 mg daily She will start one half pill for the first week If no improvement on her blood pressure numbers, may need to increase the dose  History of DVT (deep vein thrombosis) Recommended compression hose  Bronchiectasis  without complication (HCC) Breathing is stable, no recent bronchitis episodes  Leg swelling Suspect leg swelling secondary to venous insufficiency, unable to exclude small component of emphysema. She does not want lymphedema compression pumps, prefers Ace wraps  SOB (shortness of breath) Stable, recommended regular exercise program  Long discussion with her concerning above  Total encounter time more than 45 minutes  Greater than 50% was spent in counseling and coordination of care with the patient   Disposition:   F/U  6 months  No orders of the defined types were placed in this encounter.    Signed, Esmond Plants, M.D., Ph.D. 04/15/2017  [place], [place]

## 2017-04-15 ENCOUNTER — Encounter: Payer: Self-pay | Admitting: Cardiovascular Disease

## 2017-04-15 ENCOUNTER — Ambulatory Visit (INDEPENDENT_AMBULATORY_CARE_PROVIDER_SITE_OTHER): Payer: PPO | Admitting: Cardiovascular Disease

## 2017-04-15 VITALS — BP 140/78 | HR 66 | Ht 63.0 in | Wt 125.5 lb

## 2017-04-15 DIAGNOSIS — M7989 Other specified soft tissue disorders: Secondary | ICD-10-CM | POA: Diagnosis not present

## 2017-04-15 DIAGNOSIS — J479 Bronchiectasis, uncomplicated: Secondary | ICD-10-CM

## 2017-04-15 DIAGNOSIS — R0989 Other specified symptoms and signs involving the circulatory and respiratory systems: Secondary | ICD-10-CM

## 2017-04-15 DIAGNOSIS — Z86718 Personal history of other venous thrombosis and embolism: Secondary | ICD-10-CM | POA: Diagnosis not present

## 2017-04-15 DIAGNOSIS — R0602 Shortness of breath: Secondary | ICD-10-CM | POA: Diagnosis not present

## 2017-04-15 MED ORDER — DOXAZOSIN MESYLATE 4 MG PO TABS: 4.0000 mg | ORAL_TABLET | Freq: Every day | ORAL | 3 refills | Status: DC

## 2017-04-15 MED ORDER — LOSARTAN POTASSIUM 100 MG PO TABS: 100.0000 mg | ORAL_TABLET | Freq: Every day | ORAL | 3 refills | Status: DC

## 2017-04-15 MED ORDER — HYDROCHLOROTHIAZIDE 25 MG PO TABS: 25.0000 mg | ORAL_TABLET | Freq: Every day | ORAL | 3 refills | Status: DC

## 2017-04-15 MED ORDER — HYDRALAZINE HCL 25 MG PO TABS: 25.0000 mg | ORAL_TABLET | ORAL | 6 refills | Status: DC | PRN

## 2017-04-15 NOTE — Patient Instructions (Addendum)
Medication Instructions:   Continue losartan 100 mg one a day in the Am  Hold the hydralazine Take as needed for pressure >170  Start cardura/dozasoxin 4 mg daily in the PM (cut in 1/2 for the first week) Monitor blood pressure   HCTZ as needed for ankle swelling  Please call with blood pressure numbers  Labwork:  No new labs needed  Testing/Procedures:  No further testing at this time   Follow-Up: It was a pleasure seeing you in the office today. Please call us if you have new issues that need to be addressed before your next appt.  [phone]  Your physician wants you to follow-up in: 6 months.  You will receive a reminder letter in the mail two months in advance. If you don't receive a letter, please call our office to schedule the follow-up appointment.  If you need a refill on your cardiac medications before your next appointment, please call your pharmacy.

## 2017-04-23 ENCOUNTER — Telehealth: Payer: Self-pay | Admitting: Cardiovascular Disease

## 2017-04-23 NOTE — Telephone Encounter (Signed)
Pt c/o BP issue: STAT if pt c/o blurred vision, one-sided weakness or slurred speech No  1. What are your last 5 BP readings?  7/14 - 137/85 9PM 7/15 - before meds  9AM 164/104 , noon 113/75 7/16 - 1030PM after meds 133/78 7/17 - hour after meds 10:00AM 140/80  7/18 - 330PM after meds 141/81 , 430PM 135/87 7/19 - no meds 140/95 10 minutes later 148/97  2. Are you having any other symptoms (ex. Dizziness, headache, blurred vision, passed out)? Headaches in the morning   3. What is your BP issue?

## 2017-04-23 NOTE — Telephone Encounter (Signed)
Patient states that these high blood pressure readings were before taking her medications. Reviewed that there were still some that were on the higher side. She then wanted to know if she should go ahead and start taking the whole pill. These numbers were on the 1/2 pill of cardura and she mentioned that this was discussed during her last visit. Instructed her to go ahead and increase and continue monitoring blood pressures and then give [place] a call back if they persist in the 140/80 range or higher. She was very appreciative for the call back and verbalized understanding of our conversation, agreement with plan, and had no further questions at this time.

## 2017-04-28 DIAGNOSIS — H16223 Keratoconjunctivitis sicca, not specified as Sjogren's, bilateral: Secondary | ICD-10-CM | POA: Diagnosis not present

## 2017-05-05 DIAGNOSIS — H10503 Unspecified blepharoconjunctivitis, bilateral: Secondary | ICD-10-CM | POA: Diagnosis not present

## 2017-05-28 DIAGNOSIS — H35711 Central serous chorioretinopathy, right eye: Secondary | ICD-10-CM | POA: Diagnosis not present

## 2017-06-02 DIAGNOSIS — R208 Other disturbances of skin sensation: Secondary | ICD-10-CM | POA: Diagnosis not present

## 2017-06-02 DIAGNOSIS — L820 Inflamed seborrheic keratosis: Secondary | ICD-10-CM | POA: Diagnosis not present

## 2017-06-02 DIAGNOSIS — L298 Other pruritus: Secondary | ICD-10-CM | POA: Diagnosis not present

## 2017-06-02 DIAGNOSIS — R58 Hemorrhage, not elsewhere classified: Secondary | ICD-10-CM | POA: Diagnosis not present

## 2017-06-02 DIAGNOSIS — Z85828 Personal history of other malignant neoplasm of skin: Secondary | ICD-10-CM | POA: Diagnosis not present

## 2017-06-02 DIAGNOSIS — L718 Other rosacea: Secondary | ICD-10-CM | POA: Diagnosis not present

## 2017-06-02 DIAGNOSIS — B078 Other viral warts: Secondary | ICD-10-CM | POA: Diagnosis not present

## 2017-06-02 DIAGNOSIS — Z08 Encounter for follow-up examination after completed treatment for malignant neoplasm: Secondary | ICD-10-CM | POA: Diagnosis not present

## 2017-06-08 DIAGNOSIS — M5030 Other cervical disc degeneration, unspecified cervical region: Secondary | ICD-10-CM | POA: Diagnosis not present

## 2017-06-08 DIAGNOSIS — J479 Bronchiectasis, uncomplicated: Secondary | ICD-10-CM | POA: Diagnosis not present

## 2017-06-08 DIAGNOSIS — Z0000 Encounter for general adult medical examination without abnormal findings: Secondary | ICD-10-CM | POA: Diagnosis not present

## 2017-06-08 DIAGNOSIS — M797 Fibromyalgia: Secondary | ICD-10-CM | POA: Diagnosis not present

## 2017-06-08 DIAGNOSIS — I10 Essential (primary) hypertension: Secondary | ICD-10-CM | POA: Diagnosis not present

## 2017-06-14 DIAGNOSIS — Z0000 Encounter for general adult medical examination without abnormal findings: Secondary | ICD-10-CM | POA: Diagnosis not present

## 2017-06-14 DIAGNOSIS — M797 Fibromyalgia: Secondary | ICD-10-CM | POA: Diagnosis not present

## 2017-06-14 DIAGNOSIS — M1990 Unspecified osteoarthritis, unspecified site: Secondary | ICD-10-CM | POA: Diagnosis not present

## 2017-06-14 DIAGNOSIS — J479 Bronchiectasis, uncomplicated: Secondary | ICD-10-CM | POA: Diagnosis not present

## 2017-06-14 DIAGNOSIS — F329 Major depressive disorder, single episode, unspecified: Secondary | ICD-10-CM | POA: Diagnosis not present

## 2017-06-14 DIAGNOSIS — G43709 Chronic migraine without aura, not intractable, without status migrainosus: Secondary | ICD-10-CM | POA: Diagnosis not present

## 2017-06-14 DIAGNOSIS — J449 Chronic obstructive pulmonary disease, unspecified: Secondary | ICD-10-CM | POA: Diagnosis not present

## 2017-06-14 DIAGNOSIS — I10 Essential (primary) hypertension: Secondary | ICD-10-CM | POA: Diagnosis not present

## 2017-06-22 DIAGNOSIS — K52839 Microscopic colitis, unspecified: Secondary | ICD-10-CM | POA: Diagnosis not present

## 2017-06-22 DIAGNOSIS — K5900 Constipation, unspecified: Secondary | ICD-10-CM | POA: Diagnosis not present

## 2017-06-22 DIAGNOSIS — M1990 Unspecified osteoarthritis, unspecified site: Secondary | ICD-10-CM | POA: Diagnosis not present

## 2017-06-23 DIAGNOSIS — R1032 Left lower quadrant pain: Secondary | ICD-10-CM | POA: Diagnosis not present

## 2017-06-23 DIAGNOSIS — R1031 Right lower quadrant pain: Secondary | ICD-10-CM | POA: Diagnosis not present

## 2017-06-23 DIAGNOSIS — K52832 Lymphocytic colitis: Secondary | ICD-10-CM | POA: Diagnosis not present

## 2017-06-23 DIAGNOSIS — M6289 Other specified disorders of muscle: Secondary | ICD-10-CM | POA: Diagnosis not present

## 2017-06-23 DIAGNOSIS — Z800 Family history of malignant neoplasm of digestive organs: Secondary | ICD-10-CM | POA: Diagnosis not present

## 2017-07-06 DIAGNOSIS — H35051 Retinal neovascularization, unspecified, right eye: Secondary | ICD-10-CM | POA: Diagnosis not present

## 2017-07-06 DIAGNOSIS — H35711 Central serous chorioretinopathy, right eye: Secondary | ICD-10-CM | POA: Diagnosis not present

## 2017-07-28 DIAGNOSIS — H60543 Acute eczematoid otitis externa, bilateral: Secondary | ICD-10-CM | POA: Diagnosis not present

## 2017-07-28 DIAGNOSIS — H903 Sensorineural hearing loss, bilateral: Secondary | ICD-10-CM | POA: Diagnosis not present

## 2017-08-17 DIAGNOSIS — H35711 Central serous chorioretinopathy, right eye: Secondary | ICD-10-CM | POA: Diagnosis not present

## 2017-10-08 DIAGNOSIS — M1990 Unspecified osteoarthritis, unspecified site: Secondary | ICD-10-CM | POA: Diagnosis not present

## 2017-10-08 DIAGNOSIS — F329 Major depressive disorder, single episode, unspecified: Secondary | ICD-10-CM | POA: Diagnosis not present

## 2017-10-08 DIAGNOSIS — M797 Fibromyalgia: Secondary | ICD-10-CM | POA: Diagnosis not present

## 2017-10-08 DIAGNOSIS — J449 Chronic obstructive pulmonary disease, unspecified: Secondary | ICD-10-CM | POA: Diagnosis not present

## 2017-10-08 DIAGNOSIS — J479 Bronchiectasis, uncomplicated: Secondary | ICD-10-CM | POA: Diagnosis not present

## 2017-10-08 DIAGNOSIS — I10 Essential (primary) hypertension: Secondary | ICD-10-CM | POA: Diagnosis not present

## 2017-10-08 DIAGNOSIS — G43709 Chronic migraine without aura, not intractable, without status migrainosus: Secondary | ICD-10-CM | POA: Diagnosis not present

## 2017-10-12 DIAGNOSIS — M25562 Pain in left knee: Secondary | ICD-10-CM | POA: Diagnosis not present

## 2017-10-12 DIAGNOSIS — M1712 Unilateral primary osteoarthritis, left knee: Secondary | ICD-10-CM | POA: Diagnosis not present

## 2017-10-12 NOTE — Progress Notes (Signed)
Cardiology Office Note  Date:  10/13/2017   ID:  Lindsey [name], DOB 01/10/1936, MRN [id number]  PCP:  Lindsey Harrier, MD   Chief Complaint  Patient presents with  . other    6 month f/u c/o sinus headache. Meds reviewed verbally with pt.    HPI:  Lindsey [name] is a pleasant 82 year old woman with history of  lower extremity swelling,Secondary to venous insufficiency  prior right knee surgery,  right DVT, status post IVC filter,  bronchiectasis on inhalers and nebulizers at home  who presents for follow up of her labile blood pressure and her leg swelling.  120 - 166/60-90 On average 120/70 Still labile Cardura in the PM Hydralazine and losartan in the AM Not on HCTZ, was having urinary problems  Blood pressure using her blood pressure cuff initially 150 something then down to 148 My blood pressure 409 systolic manual  Chronic leg swelling Unable to wear compression hose  EKG personally reviewed by myself on todays visit Shows normal sinus rhythm rate 82 bpm right bundle branch block left anterior fascicular block  Other past medical history reviewed visit to the emergency room ER 6/28,2018 Reported having dizziness and shortness of breath. Presented relatively acutely  Was drinking extra wine ,  stood up she felt dizzy/lightheaded and "breathing heavily".  Symptoms lasted approximately 2 hours  resolved.  Workup in the emergency room was benign Lab work as detailed below discussed with her Sodium 134, potassium 3.2, BUN 21 She does not take potassium supplement on a [name] basis  Leg swelling History dates back many years but started with right knee surgery after an accident, fracture. She suffered a DVT, had IVC filter placed. Swelling worse at the end of the day, better in the morning. She does have significant varicosities.   bronchiectasis diagnosed at outside facility.  stress test may 2015 which was reportedly normal  Recently in the emergency room several  weeks ago for headache. Blood pressure that time was elevated, CT scan of the head was normal. Periodically has severe headaches   PMH:   has a past medical history of Anemia, Asthma, Breast cancer ([place]), Bronchiectasis ([place]) (06/2007), Cancer ([place]), Colitis, COPD (chronic obstructive pulmonary disease) ([place]), Depression, DVT (deep venous thrombosis) ([place]), Fibromyalgia, Fibromyalgia, Hypertension, Migraine headache with aura, Osteoarthritis, Pneumonia, and Polio (1952).  PSH:    Past Surgical History:  Procedure Laterality Date  . ABDOMINAL HYSTERECTOMY    . BILATERAL TOTAL MASTECTOMY WITH AXILLARY LYMPH NODE DISSECTION    . CARPAL TUNNEL RELEASE     bilateral   . COLONOSCOPY WITH PROPOFOL N/A 08/02/2015   Procedure: COLONOSCOPY WITH PROPOFOL;  Surgeon: Manya Silvas, MD;  Location: [facility] ENDOSCOPY;  Service: Endoscopy;  Laterality: N/A;  . FINGER TENDON REPAIR    . HERNIA REPAIR    . REPLACEMENT TOTAL KNEE     right knee   . TONSILLECTOMY    . UMBILICAL HERNIA REPAIR    . VAGINAL HYSTERECTOMY      Current Outpatient Medications  Medication Sig Dispense Refill  . budesonide (ENTOCORT EC) 3 MG 24 hr capsule Take 9 mg by mouth [name].    . Calcium Carbonate-Vitamin D (CALCIUM 500 + D PO) Take 1,000 mcg by mouth [name].    . cyclobenzaprine (FLEXERIL) 5 MG tablet Take 5 mg by mouth at bedtime.    Marland Kitchen doxazosin (CARDURA) 4 MG tablet Take 1 tablet (4 mg total) by mouth [name]. 90 tablet 3  . DULoxetine (CYMBALTA) 60 MG capsule  Take 60 mg by mouth [name].    . hydrALAZINE (APRESOLINE) 25 MG tablet Take 1 tablet (25 mg total) by mouth as needed (for BP >170). 30 tablet 6  . hydrochlorothiazide (HYDRODIURIL) 25 MG tablet Take 1 tablet (25 mg total) by mouth [name]. 90 tablet 3  . losartan (COZAAR) 100 MG tablet Take 1 tablet (100 mg total) by mouth [name]. 90 tablet 3  . Omega-3 Fatty Acids (FISH OIL) 1000 MG CAPS Take 1,000 mg by mouth [name].    . predniSONE (STERAPRED UNI-PAK 21 TAB) 10 MG (21)  TBPK tablet Take by mouth as needed.    . rizatriptan (MAXALT) 10 MG tablet Take 10 mg by mouth as needed for migraine. May repeat in 2 hours if needed    . SYMBICORT 160-4.5 MCG/ACT inhaler     . terconazole (TERAZOL 7) 0.4 % vaginal cream Place 1 applicator vaginally at bedtime.     No current facility-administered medications for this visit.      Allergies:   Ivp dye [iodinated diagnostic agents]; Codeine; Sulfa antibiotics; Tegaderm ag mesh [silver]; and Tetracyclines & related   Social History:  The patient  reports that  has never smoked. she has never used smokeless tobacco. She reports that she drinks about 0.6 oz of alcohol per week. She reports that she does not use drugs.   Family History:   Family history is unknown by patient.    Review of Systems: Review of Systems  Constitutional: Negative.   Respiratory: Negative.   Cardiovascular: Positive for leg swelling.  Gastrointestinal: Negative.   Musculoskeletal: Negative.   Neurological: Negative.   Psychiatric/Behavioral: Negative.   All other systems reviewed and are negative.    PHYSICAL EXAM: VS:  BP (!) 150/70 (BP Location: Left Arm, Patient Position: Sitting, Cuff Size: Normal)   Pulse 82   Ht 5' 2.5" (1.588 m)   Wt 129 lb 8 oz (58.7 kg)   BMI 23.31 kg/m  , BMI Body mass index is 23.31 kg/m.  No significant change in physical exam compared to previous office visit GEN: Well nourished, well developed, in no acute distress  HEENT: normal  Neck: no JVD, carotid bruits, or masses Cardiac: RRR; no murmurs, rubs, or gallops,no edema  Respiratory:  clear to auscultation bilaterally, normal work of breathing GI: soft, nontender, nondistended, + BS Lindsey: no deformity or atrophy  Skin: warm and dry, no rash Neuro:  Strength and sensation are intact Psych: euthymic mood, full affect    Recent Labs: 03/31/2017: BUN 21; Creatinine, Ser 0.59; Hemoglobin 13.8; Platelets 230; Potassium 3.2; Sodium 134    Lipid  Panel No results found for: CHOL, HDL, LDLCALC, TRIG    Wt Readings from Last 3 Encounters:  10/13/17 129 lb 8 oz (58.7 kg)  04/15/17 125 lb 8 oz (56.9 kg)  03/31/17 120 lb (54.4 kg)       ASSESSMENT AND PLAN:  Labile blood pressure Reasonable blood pressures but she reports they are somewhat labile Recommended she take Cardura 2 mg twice [name] rather than 4 mg in the evening Continue hydralazine and losartan in the morning If blood pressure runs up in the afternoon take additional hydralazine  History of DVT (deep vein thrombosis) Recommended compression hose She is active at baseline  Bronchiectasis without complication (HCC) Breathing is stable, no recent bronchitis episodes Stable  Leg swelling Suspect leg swelling secondary to venous insufficiency She does not want lymphedema compression pumps, prefers Ace wraps Unable to tolerate TED hose  SOB (  shortness of breath) Stable, recommended regular exercise program No symptoms on today's visit  Long discussion with her concerning above  Total encounter time more than 25 minutes  Greater than 50% was spent in counseling and coordination of care with the patient   Disposition:   F/U  12 months   Orders Placed This Encounter  Procedures  . EKG 12-Lead     Signed, Esmond Plants, M.D., Ph.D. 10/13/2017  [place], [place]

## 2017-10-13 ENCOUNTER — Encounter: Payer: Self-pay | Admitting: Cardiovascular Disease

## 2017-10-13 ENCOUNTER — Ambulatory Visit (INDEPENDENT_AMBULATORY_CARE_PROVIDER_SITE_OTHER): Payer: PPO | Admitting: Cardiovascular Disease

## 2017-10-13 VITALS — BP 139/77 | HR 82 | Ht 62.5 in | Wt 129.5 lb

## 2017-10-13 DIAGNOSIS — R0989 Other specified symptoms and signs involving the circulatory and respiratory systems: Secondary | ICD-10-CM | POA: Diagnosis not present

## 2017-10-13 DIAGNOSIS — Z86718 Personal history of other venous thrombosis and embolism: Secondary | ICD-10-CM

## 2017-10-13 DIAGNOSIS — J479 Bronchiectasis, uncomplicated: Secondary | ICD-10-CM | POA: Diagnosis not present

## 2017-10-13 DIAGNOSIS — M7989 Other specified soft tissue disorders: Secondary | ICD-10-CM | POA: Diagnosis not present

## 2017-10-13 DIAGNOSIS — R0602 Shortness of breath: Secondary | ICD-10-CM | POA: Diagnosis not present

## 2017-10-13 NOTE — Patient Instructions (Signed)
Medication Instructions:   Consider taking cardura/doxazosin 1/2 pill twice a day  Monitor afternoon pressures  If BP is elevated in the afternoon or evening, take another small pick pill (hydralazine)  Labwork:  No new labs needed  Testing/Procedures:  No further testing at this time   Follow-Up: It was a pleasure seeing you in the office today. Please call us if you have new issues that need to be addressed before your next appt.  [phone]  Your physician wants you to follow-up in: 12 months.  You will receive a reminder letter in the mail two months in advance. If you don't receive a letter, please call our office to schedule the follow-up appointment.  If you need a refill on your cardiac medications before your next appointment, please call your pharmacy.

## 2017-10-15 DIAGNOSIS — M797 Fibromyalgia: Secondary | ICD-10-CM | POA: Diagnosis not present

## 2017-10-15 DIAGNOSIS — M1990 Unspecified osteoarthritis, unspecified site: Secondary | ICD-10-CM | POA: Diagnosis not present

## 2017-10-15 DIAGNOSIS — E876 Hypokalemia: Secondary | ICD-10-CM | POA: Diagnosis not present

## 2017-10-15 DIAGNOSIS — M4802 Spinal stenosis, cervical region: Secondary | ICD-10-CM | POA: Diagnosis not present

## 2017-10-15 DIAGNOSIS — I10 Essential (primary) hypertension: Secondary | ICD-10-CM | POA: Diagnosis not present

## 2017-10-15 DIAGNOSIS — M5030 Other cervical disc degeneration, unspecified cervical region: Secondary | ICD-10-CM | POA: Diagnosis not present

## 2017-10-18 DIAGNOSIS — H35711 Central serous chorioretinopathy, right eye: Secondary | ICD-10-CM | POA: Diagnosis not present

## 2017-11-04 ENCOUNTER — Telehealth: Payer: Self-pay | Admitting: Physician Assistant

## 2017-11-04 NOTE — Telephone Encounter (Signed)
Patient was past medical history of labile hypertension paged after our answering service due to generalized weakness and dizziness when her blood pressure dropped down to the 80s.  However it quickly came back up to 100 range.  She denies any significant headache or ipsilateral weakness to suggest a possible stroke.  Right now her symptom has improved.  She already took her losartan and HCTZ today, she is due for a nighttime dose of Cardura, I asked her to hold tonight's dose of Cardura and drink plenty of fluids.  I also asked her if her symptom become worse she will need to seek medical attention in the ED  Signed, Almyra Deforest PA Pager: [phone]

## 2017-11-06 NOTE — Telephone Encounter (Signed)
Can we call to follow-up with patient Check blood pressure Figure out why dropped low where she dehydrated or do we need medication adjustment If needs medication adjustment would come in for visit

## 2017-11-08 NOTE — Telephone Encounter (Signed)
Called and s/w patient. She stated her BP has improved since the evening of 1/31. On the evening, BP was 85/50 and she felt very weak and faint. She said she had laid in her bed a while before she had the strength to get up and even take her BP.   Friday, 2/1 BP 112/74 AT 2 PM   BP 142/75 AT 8 PM.   BP 147/84 AT 10 PM.  Saturday, 2/2 BP 135/68, 77 AT 2 PM.  SUNDAY, 2/3 BP 119/69 AT 3 PM   BP 147/84 AT 10 PM.   BP 141/98 AT 11 PM. Today, 2/4 BP 139/83, 70 at 2:12 pm while on the phone.  Patient's only symptom is occasional dizziness. Patient states she is making sure to drink plenty of water and fluids.  Lastly, I advised patient if she felt that weak at any point again she should call 911 and proceed to ER as she lives alone. Patient proceeded to explain to me that she had a horrible experience of waiting at the ER the last night and when she shared her story with Dr Rockey Situ, he said not to go to the ER. Patient stated that Dr Rockey Situ told her to call him personally and not to go to the ER. She stated I forgot to get his number so could you ask him if it is ok to give it to me.  I explained that our office is open during normal business hours where she can call in for non-emergent advice as well as we have a provider on call.  Advised that if she was about to pass out or had chest pain or any kind of emergency to call 911 or go to ER. She stated again this is not what Dr Rockey Situ told me to do. I will not go back to the ER because he said I could call him.  Discussed with office manager concerning triage procedures in directing patient to ED.  Routing to Dr Rockey Situ for review.

## 2017-11-08 NOTE — Telephone Encounter (Signed)
Blood pressure currently looks better, back to normal Would recommend if she has a day when she does not drink much fluids,  she could hold the HCTZ the next day to avoid dehydration, and would rehydrate Can be a major problem in the summer heat  Typically does not need to go to the emergency room She can lay flat and hydrate at home as that is what the emergency room would typically do If blood pressure does run very low would hold blood pressure pills until that shows signs of recovering and going back to normal

## 2017-11-09 NOTE — Telephone Encounter (Signed)
Left voicemail message to call back  

## 2017-11-10 NOTE — Telephone Encounter (Signed)
Spoke with patient and reviewed Dr. Donivan Scull recommendations and discussed blood pressure monitoring. Recommended to keep a log of her readings and instructions for any low readings such as rest and hydration. She verbalized understanding of our conversation, agreement with plan, and had no further questions at this time.

## 2017-11-12 DIAGNOSIS — H1859 Other hereditary corneal dystrophies: Secondary | ICD-10-CM | POA: Diagnosis not present

## 2017-11-15 NOTE — Telephone Encounter (Signed)
Patient returning call States nothing much to report, her blood pressure hasn't been real high or real low BP has been ranging around 150/90 Please call to discuss

## 2017-11-16 NOTE — Telephone Encounter (Addendum)
Spoke with patient and reviewed her blood pressure readings. She reports that she has been feeling better and that blood pressures remain a little high. Discussed her medications with her and she has been holding the HCTZ. Reviewed instructions on holding this medication only when she does not stay well hydrated. Instructed her to continue taking and to use extreme caution with changing positions and to continue monitoring blood pressures and call us if it persists greater than 140/80. She verbalized understanding of our conversation, agreement with plan, and had no further questions at this time.

## 2017-11-29 DIAGNOSIS — I10 Essential (primary) hypertension: Secondary | ICD-10-CM | POA: Diagnosis not present

## 2017-11-29 DIAGNOSIS — J479 Bronchiectasis, uncomplicated: Secondary | ICD-10-CM | POA: Diagnosis not present

## 2017-11-29 DIAGNOSIS — R05 Cough: Secondary | ICD-10-CM | POA: Diagnosis not present

## 2017-11-29 DIAGNOSIS — R0789 Other chest pain: Secondary | ICD-10-CM | POA: Diagnosis not present

## 2017-11-29 DIAGNOSIS — M1990 Unspecified osteoarthritis, unspecified site: Secondary | ICD-10-CM | POA: Diagnosis not present

## 2017-12-05 DIAGNOSIS — B9689 Other specified bacterial agents as the cause of diseases classified elsewhere: Secondary | ICD-10-CM | POA: Diagnosis not present

## 2017-12-05 DIAGNOSIS — J019 Acute sinusitis, unspecified: Secondary | ICD-10-CM | POA: Diagnosis not present

## 2017-12-05 DIAGNOSIS — J209 Acute bronchitis, unspecified: Secondary | ICD-10-CM | POA: Diagnosis not present

## 2017-12-05 DIAGNOSIS — R05 Cough: Secondary | ICD-10-CM | POA: Diagnosis not present

## 2017-12-05 DIAGNOSIS — R079 Chest pain, unspecified: Secondary | ICD-10-CM | POA: Diagnosis not present

## 2017-12-05 DIAGNOSIS — R509 Fever, unspecified: Secondary | ICD-10-CM | POA: Diagnosis not present

## 2017-12-09 DIAGNOSIS — D225 Melanocytic nevi of trunk: Secondary | ICD-10-CM | POA: Diagnosis not present

## 2017-12-09 DIAGNOSIS — L720 Epidermal cyst: Secondary | ICD-10-CM | POA: Diagnosis not present

## 2017-12-09 DIAGNOSIS — Z85828 Personal history of other malignant neoplasm of skin: Secondary | ICD-10-CM | POA: Diagnosis not present

## 2017-12-09 DIAGNOSIS — D485 Neoplasm of uncertain behavior of skin: Secondary | ICD-10-CM | POA: Diagnosis not present

## 2017-12-09 DIAGNOSIS — D2262 Melanocytic nevi of left upper limb, including shoulder: Secondary | ICD-10-CM | POA: Diagnosis not present

## 2017-12-09 DIAGNOSIS — C44622 Squamous cell carcinoma of skin of right upper limb, including shoulder: Secondary | ICD-10-CM | POA: Diagnosis not present

## 2017-12-13 DIAGNOSIS — E876 Hypokalemia: Secondary | ICD-10-CM | POA: Diagnosis not present

## 2017-12-15 DIAGNOSIS — J479 Bronchiectasis, uncomplicated: Secondary | ICD-10-CM | POA: Diagnosis not present

## 2017-12-15 DIAGNOSIS — M797 Fibromyalgia: Secondary | ICD-10-CM | POA: Diagnosis not present

## 2017-12-15 DIAGNOSIS — J208 Acute bronchitis due to other specified organisms: Secondary | ICD-10-CM | POA: Diagnosis not present

## 2017-12-15 DIAGNOSIS — I10 Essential (primary) hypertension: Secondary | ICD-10-CM | POA: Diagnosis not present

## 2017-12-15 DIAGNOSIS — B9689 Other specified bacterial agents as the cause of diseases classified elsewhere: Secondary | ICD-10-CM | POA: Diagnosis not present

## 2017-12-20 DIAGNOSIS — H35711 Central serous chorioretinopathy, right eye: Secondary | ICD-10-CM | POA: Diagnosis not present

## 2018-01-01 ENCOUNTER — Other Ambulatory Visit: Payer: Self-pay | Admitting: Cardiovascular Disease

## 2018-01-14 ENCOUNTER — Other Ambulatory Visit: Payer: Self-pay | Admitting: Cardiovascular Disease

## 2018-02-02 DIAGNOSIS — C44622 Squamous cell carcinoma of skin of right upper limb, including shoulder: Secondary | ICD-10-CM | POA: Diagnosis not present

## 2018-02-02 DIAGNOSIS — L905 Scar conditions and fibrosis of skin: Secondary | ICD-10-CM | POA: Diagnosis not present

## 2018-02-05 DIAGNOSIS — J069 Acute upper respiratory infection, unspecified: Secondary | ICD-10-CM | POA: Diagnosis not present

## 2018-02-05 DIAGNOSIS — J029 Acute pharyngitis, unspecified: Secondary | ICD-10-CM | POA: Diagnosis not present

## 2018-02-05 DIAGNOSIS — Z8709 Personal history of other diseases of the respiratory system: Secondary | ICD-10-CM | POA: Diagnosis not present

## 2018-02-07 DIAGNOSIS — H5789 Other specified disorders of eye and adnexa: Secondary | ICD-10-CM | POA: Diagnosis not present

## 2018-02-07 DIAGNOSIS — J479 Bronchiectasis, uncomplicated: Secondary | ICD-10-CM | POA: Diagnosis not present

## 2018-02-07 DIAGNOSIS — I10 Essential (primary) hypertension: Secondary | ICD-10-CM | POA: Diagnosis not present

## 2018-02-07 DIAGNOSIS — M797 Fibromyalgia: Secondary | ICD-10-CM | POA: Diagnosis not present

## 2018-02-07 DIAGNOSIS — J209 Acute bronchitis, unspecified: Secondary | ICD-10-CM | POA: Diagnosis not present

## 2018-03-08 DIAGNOSIS — H35711 Central serous chorioretinopathy, right eye: Secondary | ICD-10-CM | POA: Diagnosis not present

## 2018-03-31 DIAGNOSIS — K52832 Lymphocytic colitis: Secondary | ICD-10-CM | POA: Diagnosis not present

## 2018-03-31 DIAGNOSIS — K5900 Constipation, unspecified: Secondary | ICD-10-CM | POA: Diagnosis not present

## 2018-03-31 DIAGNOSIS — K30 Functional dyspepsia: Secondary | ICD-10-CM | POA: Diagnosis not present

## 2018-04-01 DIAGNOSIS — M5412 Radiculopathy, cervical region: Secondary | ICD-10-CM | POA: Diagnosis not present

## 2018-04-01 DIAGNOSIS — M5030 Other cervical disc degeneration, unspecified cervical region: Secondary | ICD-10-CM | POA: Diagnosis not present

## 2018-04-04 ENCOUNTER — Other Ambulatory Visit: Payer: Self-pay | Admitting: Family Medicine

## 2018-04-04 DIAGNOSIS — M5412 Radiculopathy, cervical region: Secondary | ICD-10-CM

## 2018-04-11 DIAGNOSIS — M4802 Spinal stenosis, cervical region: Secondary | ICD-10-CM | POA: Diagnosis not present

## 2018-04-11 DIAGNOSIS — I10 Essential (primary) hypertension: Secondary | ICD-10-CM | POA: Diagnosis not present

## 2018-04-11 DIAGNOSIS — E876 Hypokalemia: Secondary | ICD-10-CM | POA: Diagnosis not present

## 2018-04-11 DIAGNOSIS — M797 Fibromyalgia: Secondary | ICD-10-CM | POA: Diagnosis not present

## 2018-04-11 DIAGNOSIS — M1990 Unspecified osteoarthritis, unspecified site: Secondary | ICD-10-CM | POA: Diagnosis not present

## 2018-04-11 DIAGNOSIS — M5030 Other cervical disc degeneration, unspecified cervical region: Secondary | ICD-10-CM | POA: Diagnosis not present

## 2018-04-18 DIAGNOSIS — M797 Fibromyalgia: Secondary | ICD-10-CM | POA: Diagnosis not present

## 2018-04-18 DIAGNOSIS — M5030 Other cervical disc degeneration, unspecified cervical region: Secondary | ICD-10-CM | POA: Diagnosis not present

## 2018-04-18 DIAGNOSIS — Z0000 Encounter for general adult medical examination without abnormal findings: Secondary | ICD-10-CM | POA: Diagnosis not present

## 2018-04-18 DIAGNOSIS — J479 Bronchiectasis, uncomplicated: Secondary | ICD-10-CM | POA: Diagnosis not present

## 2018-04-18 DIAGNOSIS — I10 Essential (primary) hypertension: Secondary | ICD-10-CM | POA: Diagnosis not present

## 2018-04-19 DIAGNOSIS — M5412 Radiculopathy, cervical region: Secondary | ICD-10-CM | POA: Diagnosis not present

## 2018-04-19 DIAGNOSIS — M5030 Other cervical disc degeneration, unspecified cervical region: Secondary | ICD-10-CM | POA: Diagnosis not present

## 2018-04-24 ENCOUNTER — Encounter: Payer: Self-pay | Admitting: Cardiovascular Disease

## 2018-05-10 DIAGNOSIS — H35711 Central serous chorioretinopathy, right eye: Secondary | ICD-10-CM | POA: Diagnosis not present

## 2018-05-13 ENCOUNTER — Other Ambulatory Visit: Payer: Self-pay | Admitting: Family Medicine

## 2018-05-13 DIAGNOSIS — M5412 Radiculopathy, cervical region: Secondary | ICD-10-CM

## 2018-05-13 DIAGNOSIS — M5030 Other cervical disc degeneration, unspecified cervical region: Secondary | ICD-10-CM | POA: Diagnosis not present

## 2018-05-28 ENCOUNTER — Ambulatory Visit
Admission: RE | Admit: 2018-05-28 | Discharge: 2018-05-28 | Disposition: A | Discharge status: Home / Self Care | Payer: PPO | Source: Ambulatory Visit | Attending: Family Medicine | Admitting: Family Medicine

## 2018-05-28 DIAGNOSIS — M542 Cervicalgia: Secondary | ICD-10-CM | POA: Diagnosis not present

## 2018-05-28 DIAGNOSIS — M5412 Radiculopathy, cervical region: Secondary | ICD-10-CM | POA: Insufficient documentation

## 2018-05-28 DIAGNOSIS — M4802 Spinal stenosis, cervical region: Secondary | ICD-10-CM | POA: Insufficient documentation

## 2018-05-28 DIAGNOSIS — M4312 Spondylolisthesis, cervical region: Secondary | ICD-10-CM | POA: Diagnosis not present

## 2018-06-09 ENCOUNTER — Telehealth: Payer: Self-pay | Admitting: Cardiovascular Disease

## 2018-06-09 NOTE — Telephone Encounter (Signed)
Left voicemail message to call back  

## 2018-06-09 NOTE — Telephone Encounter (Signed)
Pt states she has had spells of lightheadedness. States the EMT has performed an EKG and states she has a left ventricular block. States she is also having some "vein problems". States toward the end of the day her veins become swollen and itchy.   Pt c/o BP issue: STAT if pt c/o blurred vision, one-sided weakness or slurred speech  1. What are your last 5 BP readings?  9/5 -119/ 9/4-118/75 6/9-115/77 6/2 -125/77  2. Are you having any other symptoms (ex. Dizziness, headache, blurred vision, passed out)? Terrible headaches and lightheadedness  3. What is your BP issue? Just not right

## 2018-06-10 NOTE — Telephone Encounter (Signed)
Spoke with patient and she reports that she had a EKG due to some dizziness and EMS told her she had a left anterior fascicular block with RBBB listed as well. She states the heart rate was normal. Reviewed previous EKG's and they are consistently the same readings. Discussed her dizziness and she really thinks this was due to dehydration. Reviewed signs and symptoms to monitor for which would require further evaluation. She verbalized understanding of our conversation, agreement with plan, and had no further questions at this time.

## 2018-06-13 DIAGNOSIS — M25611 Stiffness of right shoulder, not elsewhere classified: Secondary | ICD-10-CM | POA: Diagnosis not present

## 2018-06-13 DIAGNOSIS — M25511 Pain in right shoulder: Secondary | ICD-10-CM | POA: Diagnosis not present

## 2018-06-13 DIAGNOSIS — M542 Cervicalgia: Secondary | ICD-10-CM | POA: Diagnosis not present

## 2018-06-16 DIAGNOSIS — Z85828 Personal history of other malignant neoplasm of skin: Secondary | ICD-10-CM | POA: Diagnosis not present

## 2018-06-16 DIAGNOSIS — L720 Epidermal cyst: Secondary | ICD-10-CM | POA: Diagnosis not present

## 2018-06-16 DIAGNOSIS — Z08 Encounter for follow-up examination after completed treatment for malignant neoplasm: Secondary | ICD-10-CM | POA: Diagnosis not present

## 2018-06-16 DIAGNOSIS — L821 Other seborrheic keratosis: Secondary | ICD-10-CM | POA: Diagnosis not present

## 2018-06-20 DIAGNOSIS — M25611 Stiffness of right shoulder, not elsewhere classified: Secondary | ICD-10-CM | POA: Diagnosis not present

## 2018-06-20 DIAGNOSIS — M542 Cervicalgia: Secondary | ICD-10-CM | POA: Diagnosis not present

## 2018-06-20 DIAGNOSIS — M25511 Pain in right shoulder: Secondary | ICD-10-CM | POA: Diagnosis not present

## 2018-06-21 ENCOUNTER — Other Ambulatory Visit: Payer: Self-pay | Admitting: Cardiovascular Disease

## 2018-06-23 DIAGNOSIS — M542 Cervicalgia: Secondary | ICD-10-CM | POA: Diagnosis not present

## 2018-06-23 DIAGNOSIS — M25511 Pain in right shoulder: Secondary | ICD-10-CM | POA: Diagnosis not present

## 2018-06-23 DIAGNOSIS — M25611 Stiffness of right shoulder, not elsewhere classified: Secondary | ICD-10-CM | POA: Diagnosis not present

## 2018-06-24 DIAGNOSIS — M5412 Radiculopathy, cervical region: Secondary | ICD-10-CM | POA: Diagnosis not present

## 2018-06-24 DIAGNOSIS — M5030 Other cervical disc degeneration, unspecified cervical region: Secondary | ICD-10-CM | POA: Diagnosis not present

## 2018-06-27 DIAGNOSIS — M25611 Stiffness of right shoulder, not elsewhere classified: Secondary | ICD-10-CM | POA: Diagnosis not present

## 2018-06-27 DIAGNOSIS — M542 Cervicalgia: Secondary | ICD-10-CM | POA: Diagnosis not present

## 2018-06-27 DIAGNOSIS — M25511 Pain in right shoulder: Secondary | ICD-10-CM | POA: Diagnosis not present

## 2018-06-30 DIAGNOSIS — M25611 Stiffness of right shoulder, not elsewhere classified: Secondary | ICD-10-CM | POA: Diagnosis not present

## 2018-06-30 DIAGNOSIS — M25511 Pain in right shoulder: Secondary | ICD-10-CM | POA: Diagnosis not present

## 2018-06-30 DIAGNOSIS — M542 Cervicalgia: Secondary | ICD-10-CM | POA: Diagnosis not present

## 2018-07-04 DIAGNOSIS — M542 Cervicalgia: Secondary | ICD-10-CM | POA: Diagnosis not present

## 2018-07-04 DIAGNOSIS — M25511 Pain in right shoulder: Secondary | ICD-10-CM | POA: Diagnosis not present

## 2018-07-04 DIAGNOSIS — H353211 Exudative age-related macular degeneration, right eye, with active choroidal neovascularization: Secondary | ICD-10-CM | POA: Diagnosis not present

## 2018-07-04 DIAGNOSIS — M25611 Stiffness of right shoulder, not elsewhere classified: Secondary | ICD-10-CM | POA: Diagnosis not present

## 2018-07-05 DIAGNOSIS — M797 Fibromyalgia: Secondary | ICD-10-CM | POA: Diagnosis not present

## 2018-07-05 DIAGNOSIS — M542 Cervicalgia: Secondary | ICD-10-CM | POA: Diagnosis not present

## 2018-07-05 DIAGNOSIS — M47812 Spondylosis without myelopathy or radiculopathy, cervical region: Secondary | ICD-10-CM | POA: Diagnosis not present

## 2018-07-05 DIAGNOSIS — M791 Myalgia, unspecified site: Secondary | ICD-10-CM | POA: Diagnosis not present

## 2018-07-05 DIAGNOSIS — M150 Primary generalized (osteo)arthritis: Secondary | ICD-10-CM | POA: Diagnosis not present

## 2018-07-11 DIAGNOSIS — M25511 Pain in right shoulder: Secondary | ICD-10-CM | POA: Diagnosis not present

## 2018-07-11 DIAGNOSIS — M25611 Stiffness of right shoulder, not elsewhere classified: Secondary | ICD-10-CM | POA: Diagnosis not present

## 2018-07-11 DIAGNOSIS — M542 Cervicalgia: Secondary | ICD-10-CM | POA: Diagnosis not present

## 2018-07-14 DIAGNOSIS — M25611 Stiffness of right shoulder, not elsewhere classified: Secondary | ICD-10-CM | POA: Diagnosis not present

## 2018-07-14 DIAGNOSIS — M542 Cervicalgia: Secondary | ICD-10-CM | POA: Diagnosis not present

## 2018-07-14 DIAGNOSIS — M25511 Pain in right shoulder: Secondary | ICD-10-CM | POA: Diagnosis not present

## 2018-07-18 DIAGNOSIS — M25611 Stiffness of right shoulder, not elsewhere classified: Secondary | ICD-10-CM | POA: Diagnosis not present

## 2018-07-18 DIAGNOSIS — M542 Cervicalgia: Secondary | ICD-10-CM | POA: Diagnosis not present

## 2018-07-18 DIAGNOSIS — M25511 Pain in right shoulder: Secondary | ICD-10-CM | POA: Diagnosis not present

## 2018-07-20 DIAGNOSIS — Z881 Allergy status to other antibiotic agents status: Secondary | ICD-10-CM | POA: Diagnosis not present

## 2018-07-20 DIAGNOSIS — M47813 Spondylosis without myelopathy or radiculopathy, cervicothoracic region: Secondary | ICD-10-CM | POA: Diagnosis not present

## 2018-07-20 DIAGNOSIS — Z885 Allergy status to narcotic agent status: Secondary | ICD-10-CM | POA: Diagnosis not present

## 2018-07-20 DIAGNOSIS — Z91041 Radiographic dye allergy status: Secondary | ICD-10-CM | POA: Diagnosis not present

## 2018-07-20 DIAGNOSIS — M47812 Spondylosis without myelopathy or radiculopathy, cervical region: Secondary | ICD-10-CM | POA: Diagnosis not present

## 2018-07-20 DIAGNOSIS — G8929 Other chronic pain: Secondary | ICD-10-CM | POA: Diagnosis not present

## 2018-07-20 DIAGNOSIS — M542 Cervicalgia: Secondary | ICD-10-CM | POA: Diagnosis not present

## 2018-07-20 DIAGNOSIS — Z7951 Long term (current) use of inhaled steroids: Secondary | ICD-10-CM | POA: Diagnosis not present

## 2018-07-20 DIAGNOSIS — Z888 Allergy status to other drugs, medicaments and biological substances status: Secondary | ICD-10-CM | POA: Diagnosis not present

## 2018-07-20 DIAGNOSIS — M6281 Muscle weakness (generalized): Secondary | ICD-10-CM | POA: Diagnosis not present

## 2018-07-20 DIAGNOSIS — Z79899 Other long term (current) drug therapy: Secondary | ICD-10-CM | POA: Diagnosis not present

## 2018-07-20 DIAGNOSIS — B91 Sequelae of poliomyelitis: Secondary | ICD-10-CM | POA: Diagnosis not present

## 2018-07-20 DIAGNOSIS — Z96651 Presence of right artificial knee joint: Secondary | ICD-10-CM | POA: Diagnosis not present

## 2018-07-20 DIAGNOSIS — Z882 Allergy status to sulfonamides status: Secondary | ICD-10-CM | POA: Diagnosis not present

## 2018-07-20 DIAGNOSIS — M797 Fibromyalgia: Secondary | ICD-10-CM | POA: Diagnosis not present

## 2018-07-21 DIAGNOSIS — M25511 Pain in right shoulder: Secondary | ICD-10-CM | POA: Diagnosis not present

## 2018-07-21 DIAGNOSIS — M542 Cervicalgia: Secondary | ICD-10-CM | POA: Diagnosis not present

## 2018-07-21 DIAGNOSIS — M25611 Stiffness of right shoulder, not elsewhere classified: Secondary | ICD-10-CM | POA: Diagnosis not present

## 2018-07-25 DIAGNOSIS — M25611 Stiffness of right shoulder, not elsewhere classified: Secondary | ICD-10-CM | POA: Diagnosis not present

## 2018-07-25 DIAGNOSIS — M542 Cervicalgia: Secondary | ICD-10-CM | POA: Diagnosis not present

## 2018-07-25 DIAGNOSIS — M25511 Pain in right shoulder: Secondary | ICD-10-CM | POA: Diagnosis not present

## 2018-08-08 DIAGNOSIS — M47812 Spondylosis without myelopathy or radiculopathy, cervical region: Secondary | ICD-10-CM | POA: Diagnosis not present

## 2018-08-08 DIAGNOSIS — M542 Cervicalgia: Secondary | ICD-10-CM | POA: Diagnosis not present

## 2018-08-08 DIAGNOSIS — M47813 Spondylosis without myelopathy or radiculopathy, cervicothoracic region: Secondary | ICD-10-CM | POA: Diagnosis not present

## 2018-08-11 DIAGNOSIS — M542 Cervicalgia: Secondary | ICD-10-CM | POA: Diagnosis not present

## 2018-08-11 DIAGNOSIS — M25611 Stiffness of right shoulder, not elsewhere classified: Secondary | ICD-10-CM | POA: Diagnosis not present

## 2018-08-11 DIAGNOSIS — M25511 Pain in right shoulder: Secondary | ICD-10-CM | POA: Diagnosis not present

## 2018-08-15 DIAGNOSIS — M542 Cervicalgia: Secondary | ICD-10-CM | POA: Diagnosis not present

## 2018-08-15 DIAGNOSIS — M25511 Pain in right shoulder: Secondary | ICD-10-CM | POA: Diagnosis not present

## 2018-08-15 DIAGNOSIS — M25611 Stiffness of right shoulder, not elsewhere classified: Secondary | ICD-10-CM | POA: Diagnosis not present

## 2018-08-18 DIAGNOSIS — M25611 Stiffness of right shoulder, not elsewhere classified: Secondary | ICD-10-CM | POA: Diagnosis not present

## 2018-08-18 DIAGNOSIS — M542 Cervicalgia: Secondary | ICD-10-CM | POA: Diagnosis not present

## 2018-08-18 DIAGNOSIS — M25511 Pain in right shoulder: Secondary | ICD-10-CM | POA: Diagnosis not present

## 2018-08-22 DIAGNOSIS — H353211 Exudative age-related macular degeneration, right eye, with active choroidal neovascularization: Secondary | ICD-10-CM | POA: Diagnosis not present

## 2018-08-22 DIAGNOSIS — M25511 Pain in right shoulder: Secondary | ICD-10-CM | POA: Diagnosis not present

## 2018-08-22 DIAGNOSIS — M542 Cervicalgia: Secondary | ICD-10-CM | POA: Diagnosis not present

## 2018-08-22 DIAGNOSIS — M25611 Stiffness of right shoulder, not elsewhere classified: Secondary | ICD-10-CM | POA: Diagnosis not present

## 2018-08-24 DIAGNOSIS — M533 Sacrococcygeal disorders, not elsewhere classified: Secondary | ICD-10-CM | POA: Diagnosis not present

## 2018-08-24 DIAGNOSIS — M797 Fibromyalgia: Secondary | ICD-10-CM | POA: Diagnosis not present

## 2018-08-24 DIAGNOSIS — M25511 Pain in right shoulder: Secondary | ICD-10-CM | POA: Diagnosis not present

## 2018-08-24 DIAGNOSIS — M47812 Spondylosis without myelopathy or radiculopathy, cervical region: Secondary | ICD-10-CM | POA: Diagnosis not present

## 2018-08-24 DIAGNOSIS — M25512 Pain in left shoulder: Secondary | ICD-10-CM | POA: Diagnosis not present

## 2018-08-24 DIAGNOSIS — Z96651 Presence of right artificial knee joint: Secondary | ICD-10-CM | POA: Diagnosis not present

## 2018-08-24 DIAGNOSIS — M47816 Spondylosis without myelopathy or radiculopathy, lumbar region: Secondary | ICD-10-CM | POA: Diagnosis not present

## 2018-09-07 DIAGNOSIS — M25511 Pain in right shoulder: Secondary | ICD-10-CM | POA: Diagnosis not present

## 2018-09-07 DIAGNOSIS — M542 Cervicalgia: Secondary | ICD-10-CM | POA: Diagnosis not present

## 2018-09-07 DIAGNOSIS — M25611 Stiffness of right shoulder, not elsewhere classified: Secondary | ICD-10-CM | POA: Diagnosis not present

## 2018-09-20 ENCOUNTER — Other Ambulatory Visit: Payer: Self-pay | Admitting: Cardiovascular Disease

## 2018-09-20 DIAGNOSIS — M1712 Unilateral primary osteoarthritis, left knee: Secondary | ICD-10-CM | POA: Diagnosis not present

## 2018-09-20 DIAGNOSIS — M25562 Pain in left knee: Secondary | ICD-10-CM | POA: Diagnosis not present

## 2018-09-21 DIAGNOSIS — M25511 Pain in right shoulder: Secondary | ICD-10-CM | POA: Diagnosis not present

## 2018-09-21 DIAGNOSIS — M25611 Stiffness of right shoulder, not elsewhere classified: Secondary | ICD-10-CM | POA: Diagnosis not present

## 2018-09-21 DIAGNOSIS — M542 Cervicalgia: Secondary | ICD-10-CM | POA: Diagnosis not present

## 2018-09-22 ENCOUNTER — Other Ambulatory Visit: Payer: Self-pay | Admitting: Anesthesiology

## 2018-09-22 ENCOUNTER — Other Ambulatory Visit (HOSPITAL_COMMUNITY): Payer: Self-pay | Admitting: Anesthesiology

## 2018-09-22 DIAGNOSIS — G894 Chronic pain syndrome: Secondary | ICD-10-CM | POA: Diagnosis not present

## 2018-09-22 DIAGNOSIS — M48062 Spinal stenosis, lumbar region with neurogenic claudication: Secondary | ICD-10-CM | POA: Diagnosis not present

## 2018-09-22 DIAGNOSIS — M1711 Unilateral primary osteoarthritis, right knee: Secondary | ICD-10-CM | POA: Diagnosis not present

## 2018-09-22 DIAGNOSIS — M25561 Pain in right knee: Secondary | ICD-10-CM | POA: Diagnosis not present

## 2018-09-22 DIAGNOSIS — M542 Cervicalgia: Secondary | ICD-10-CM | POA: Diagnosis not present

## 2018-09-22 DIAGNOSIS — M7918 Myalgia, other site: Secondary | ICD-10-CM | POA: Diagnosis not present

## 2018-09-22 DIAGNOSIS — M5416 Radiculopathy, lumbar region: Secondary | ICD-10-CM | POA: Diagnosis not present

## 2018-10-06 DIAGNOSIS — M25611 Stiffness of right shoulder, not elsewhere classified: Secondary | ICD-10-CM | POA: Diagnosis not present

## 2018-10-06 DIAGNOSIS — M542 Cervicalgia: Secondary | ICD-10-CM | POA: Diagnosis not present

## 2018-10-06 DIAGNOSIS — M25511 Pain in right shoulder: Secondary | ICD-10-CM | POA: Diagnosis not present

## 2018-10-08 ENCOUNTER — Ambulatory Visit
Admission: RE | Admit: 2018-10-08 | Discharge: 2018-10-08 | Disposition: A | Discharge status: Home / Self Care | Payer: PPO | Source: Ambulatory Visit | Attending: Anesthesiology | Admitting: Anesthesiology

## 2018-10-08 DIAGNOSIS — M48062 Spinal stenosis, lumbar region with neurogenic claudication: Secondary | ICD-10-CM | POA: Diagnosis not present

## 2018-10-08 DIAGNOSIS — M5126 Other intervertebral disc displacement, lumbar region: Secondary | ICD-10-CM | POA: Diagnosis not present

## 2018-10-08 DIAGNOSIS — M48061 Spinal stenosis, lumbar region without neurogenic claudication: Secondary | ICD-10-CM | POA: Diagnosis not present

## 2018-10-11 DIAGNOSIS — M25611 Stiffness of right shoulder, not elsewhere classified: Secondary | ICD-10-CM | POA: Diagnosis not present

## 2018-10-11 DIAGNOSIS — M25511 Pain in right shoulder: Secondary | ICD-10-CM | POA: Diagnosis not present

## 2018-10-11 DIAGNOSIS — M542 Cervicalgia: Secondary | ICD-10-CM | POA: Diagnosis not present

## 2018-10-12 DIAGNOSIS — M25562 Pain in left knee: Secondary | ICD-10-CM | POA: Diagnosis not present

## 2018-10-12 DIAGNOSIS — M542 Cervicalgia: Secondary | ICD-10-CM | POA: Diagnosis not present

## 2018-10-12 DIAGNOSIS — G894 Chronic pain syndrome: Secondary | ICD-10-CM | POA: Diagnosis not present

## 2018-10-12 DIAGNOSIS — M170 Bilateral primary osteoarthritis of knee: Secondary | ICD-10-CM | POA: Diagnosis not present

## 2018-10-12 DIAGNOSIS — M7918 Myalgia, other site: Secondary | ICD-10-CM | POA: Diagnosis not present

## 2018-10-12 DIAGNOSIS — M48062 Spinal stenosis, lumbar region with neurogenic claudication: Secondary | ICD-10-CM | POA: Diagnosis not present

## 2018-10-13 DIAGNOSIS — J479 Bronchiectasis, uncomplicated: Secondary | ICD-10-CM | POA: Diagnosis not present

## 2018-10-13 DIAGNOSIS — I10 Essential (primary) hypertension: Secondary | ICD-10-CM | POA: Diagnosis not present

## 2018-10-13 DIAGNOSIS — M797 Fibromyalgia: Secondary | ICD-10-CM | POA: Diagnosis not present

## 2018-10-13 DIAGNOSIS — M542 Cervicalgia: Secondary | ICD-10-CM | POA: Diagnosis not present

## 2018-10-13 DIAGNOSIS — M25511 Pain in right shoulder: Secondary | ICD-10-CM | POA: Diagnosis not present

## 2018-10-13 DIAGNOSIS — K52832 Lymphocytic colitis: Secondary | ICD-10-CM | POA: Diagnosis not present

## 2018-10-13 DIAGNOSIS — N8189 Other female genital prolapse: Secondary | ICD-10-CM | POA: Diagnosis not present

## 2018-10-13 DIAGNOSIS — Z0000 Encounter for general adult medical examination without abnormal findings: Secondary | ICD-10-CM | POA: Diagnosis not present

## 2018-10-13 DIAGNOSIS — F329 Major depressive disorder, single episode, unspecified: Secondary | ICD-10-CM | POA: Diagnosis not present

## 2018-10-13 DIAGNOSIS — M25611 Stiffness of right shoulder, not elsewhere classified: Secondary | ICD-10-CM | POA: Diagnosis not present

## 2018-10-13 DIAGNOSIS — M5030 Other cervical disc degeneration, unspecified cervical region: Secondary | ICD-10-CM | POA: Diagnosis not present

## 2018-10-17 DIAGNOSIS — H353211 Exudative age-related macular degeneration, right eye, with active choroidal neovascularization: Secondary | ICD-10-CM | POA: Diagnosis not present

## 2018-10-17 DIAGNOSIS — M542 Cervicalgia: Secondary | ICD-10-CM | POA: Diagnosis not present

## 2018-10-17 DIAGNOSIS — M25511 Pain in right shoulder: Secondary | ICD-10-CM | POA: Diagnosis not present

## 2018-10-17 DIAGNOSIS — M25611 Stiffness of right shoulder, not elsewhere classified: Secondary | ICD-10-CM | POA: Diagnosis not present

## 2018-10-19 DIAGNOSIS — L739 Follicular disorder, unspecified: Secondary | ICD-10-CM | POA: Diagnosis not present

## 2018-10-20 DIAGNOSIS — M25611 Stiffness of right shoulder, not elsewhere classified: Secondary | ICD-10-CM | POA: Diagnosis not present

## 2018-10-20 DIAGNOSIS — M47896 Other spondylosis, lumbar region: Secondary | ICD-10-CM | POA: Diagnosis not present

## 2018-10-20 DIAGNOSIS — F332 Major depressive disorder, recurrent severe without psychotic features: Secondary | ICD-10-CM | POA: Diagnosis not present

## 2018-10-20 DIAGNOSIS — M25511 Pain in right shoulder: Secondary | ICD-10-CM | POA: Diagnosis not present

## 2018-10-20 DIAGNOSIS — M542 Cervicalgia: Secondary | ICD-10-CM | POA: Diagnosis not present

## 2018-10-20 DIAGNOSIS — R945 Abnormal results of liver function studies: Secondary | ICD-10-CM | POA: Diagnosis not present

## 2018-10-20 DIAGNOSIS — R110 Nausea: Secondary | ICD-10-CM | POA: Diagnosis not present

## 2018-10-20 DIAGNOSIS — M5030 Other cervical disc degeneration, unspecified cervical region: Secondary | ICD-10-CM | POA: Diagnosis not present

## 2018-10-20 DIAGNOSIS — M797 Fibromyalgia: Secondary | ICD-10-CM | POA: Diagnosis not present

## 2018-10-20 DIAGNOSIS — I10 Essential (primary) hypertension: Secondary | ICD-10-CM | POA: Diagnosis not present

## 2018-10-24 ENCOUNTER — Other Ambulatory Visit: Payer: Self-pay

## 2018-10-24 ENCOUNTER — Ambulatory Visit: Payer: PPO | Attending: Student

## 2018-10-24 DIAGNOSIS — R293 Abnormal posture: Secondary | ICD-10-CM | POA: Diagnosis not present

## 2018-10-24 DIAGNOSIS — M62838 Other muscle spasm: Secondary | ICD-10-CM | POA: Insufficient documentation

## 2018-10-24 DIAGNOSIS — M533 Sacrococcygeal disorders, not elsewhere classified: Secondary | ICD-10-CM | POA: Insufficient documentation

## 2018-10-24 NOTE — Therapy (Signed)
[place] MAIN [facility] SERVICES [address] [place], [place], [address] Phone: [phone]   Fax:  [phone]  Physical Therapy Evaluation  Patient Details  Name: Lindsey [name] MRN: [id number] Date of Birth: [birth year] Referring Provider (PT): Tammi Klippel   Encounter Date: 10/24/2018  PT End of Session - 10/25/18 1249    Visit Number  1    Number of Visits  10    Date for PT Re-Evaluation  01/03/19    Authorization - Visit Number  1    Authorization - Number of Visits  10    PT Start Time  5027    PT Stop Time  1415    PT Time Calculation (min)  60 min    Activity Tolerance  Patient tolerated treatment well    Behavior During Therapy  [facility] for tasks assessed/performed       Past Medical History:  Diagnosis Date  . Anemia   . Asthma   . Breast cancer ([place])   . Bronchiectasis ([place]) 06/2007  . Cancer ([place])   . Colitis   . COPD (chronic obstructive pulmonary disease) ([place])   . Depression   . DVT (deep venous thrombosis) ([place])    after her right knee surgery.   . Fibromyalgia   . Fibromyalgia   . Hypertension   . Migraine headache with aura   . Osteoarthritis   . Pneumonia   . Polio 1952    Past Surgical History:  Procedure Laterality Date  . ABDOMINAL HYSTERECTOMY    . BILATERAL TOTAL MASTECTOMY WITH AXILLARY LYMPH NODE DISSECTION    . CARPAL TUNNEL RELEASE     bilateral   . COLONOSCOPY WITH PROPOFOL N/A 08/02/2015   Procedure: COLONOSCOPY WITH PROPOFOL;  Surgeon: Manya Silvas, MD;  Location: [facility] ENDOSCOPY;  Service: Endoscopy;  Laterality: N/A;  . FINGER TENDON REPAIR    . HERNIA REPAIR    . REPLACEMENT TOTAL KNEE     right knee   . TONSILLECTOMY    . UMBILICAL HERNIA REPAIR    . VAGINAL HYSTERECTOMY      There were no vitals filed for this visit.     Pelvic Floor Physical Therapy Evaluation and Assessment  SCREENING  Falls in last 6 mo:  Yes[place] and [place] sidewalk and  fell off the end.  Hurt her R knee/shin.    Patient's communication preference:   Red Flags:  Have you had any night sweats? no Unexplained weight loss? no Saddle anesthesia? no Unexplained changes in bowel or bladder habits? no  SUBJECTIVE  Patient reports: Has difficulty emptying bowel and bladder. Sleeps on her back and has easiest time emptying her bladder at night. Has numbness in her toes, R buttock pain, heavy legs B.  Is in scheduling for MILD of L4-L5 level non-invasive surgical intervention.  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 Bronchiectasis (NOT ASTHMA). L scoliosis, anterolisthesis of L5 on S1. Osteopenia, H/O breast CA with double mastectomy, Fibromyalgia, OA, .    Social/Family/Vocational History:   Retired Astronomer.  Recent Procedures/Tests/Findings:  Lumbar spine MRI "L4-5: There is a shallow broad-based disc bulge eccentric to the left. The central canal and right foramen are open. Mild left foraminal narrowing is noted." from Lumbar MRI in 10/08/2018    Obstetrical History: 4 vaginal deliveries with episiotomy  Gynecological History: Bartholin duct cysts in the past. Hysterectomy, ovaries spared. Was was having heavy bleeding.  Urinary History:  Has a really hard time "letting go" to urinate and takes a long time to get started. Is going ~ every 3 hours. Getting up 1 time at night. Had improved flow at night, is more likely to have leakage on the way to the bathroom at night.    Gastrointestinal History: Has a BM ~ every 3-4 days with appropriate consistency but is not able to pass without straining or digital assistance.  Sexual activity/pain: none  Location of pain: neck, shoulders, R buttock and hands (OA) Current pain:  5/10  Max pain:  8/10 Least pain:  0/10 Nature of pain: heaviness, deep ache   Patient Goals: Be able to empty bowel an bladder without having to use her hand.     OBJECTIVE  *Deferred to follow-up visit due to complexity of medical history.  Posture/Observations:  Sitting:  Standing:   Palpation/Segmental Motion/Joint Play:  Special tests:     Range of Motion/Flexibilty:  Spine: Hips:   Strength/MMT:  LE MMT  LE MMT Left Right  Hip flex:  (L2) /5 /5  Hip ext: /5 /5  Hip abd: /5 /5  Hip add: /5 /5  Hip IR /5 /5  Hip ER /5 /5     Abdominal:  Palpation: Diastasis:  Pelvic Floor External Exam: Introitus Appears:  Skin integrity:  Palpation: Cough: Prolapse visible?: Scar mobility:  Internal Vaginal Exam: Strength (PERF):  Symmetry: Palpation: Prolapse:   Internal Rectal Exam: Strength (PERF): Symmetry: Palpation: Prolapse:   Gait Analysis:   Pelvic Floor Outcome Measures: Female NIH-CPSI: 16/43 (37%), PFDI: 107/300  INTERVENTIONS THIS SESSION: Self-care: Educated on the structure and function of the pelvic floor in relation to their symptoms as well as the POC, and initial HEP in order to set patient expectations and understanding from which we will build on in the future sessions. Educated on and practiced diaphragmatic breathing to decrease tension in the PFM and use of splinting and squatty-potty to decrease straining with both urination and BM until her next visit and while we work on decreasing tone of the PFM.   Total time: 55 min.     [facility] PT Assessment - 10/26/18 0001      Assessment   Medical Diagnosis  Incomplete bowel movements    Referring Provider (PT)  Tammi Klippel    Onset Date/Surgical Date  10/26/16    Prior Therapy  none      Precautions   Precautions  None      Restrictions   Weight Bearing Restrictions  No      Balance Screen   Has the patient fallen in the past 6 months  Yes    How many times?  1    Has the patient had a decrease in activity level because of a fear of falling?   No    Is the patient reluctant to leave their home because of a fear of falling?    No      Home Environment   Living Environment  Assisted living      Prior Function   Level of Independence  Independent with basic ADLs    Vocation  Retired   prior speech therapist     Cognition   Overall Cognitive Status  Within Functional Limits for tasks assessed                Objective measurements completed on examination: See above findings.              PT Education -  10/25/18 1248    Education Details  See Pt. Instructions and Interventions this session    Person(s) Educated  Patient    Methods  Explanation;Demonstration;Verbal cues    Comprehension  Verbalized understanding;Returned demonstration;Verbal cues required       PT Short Term Goals - 10/26/18 5732      PT SHORT TERM GOAL #1   Title  Patient will demonstrate improved pelvic alignment and balance of musculature surrounding the pelvis to facilitate decreased PFM spasms and decrease pelvic pain.    Baseline  scoliosis and spasms surrounding pelvis    Time  5    Period  Weeks    Status  New    Target Date  11/30/18      PT SHORT TERM GOAL #2   Title  Patient will demonstrate a coordinated contraction, relaxation, and bulge of the pelvic floor muscles to demonstrate functional recruitment and motion and allow for further strengthening.    Baseline  Pt. is unable to actively lengthen to allow for flow of urine or stool without straining most of the time.    Time  5    Period  Weeks    Status  New    Target Date  11/30/18      PT SHORT TERM GOAL #3   Title  Patient will report consistent use of foot-stool (squatty-potty) for positioning with BM to decrease pain with BM and intra-abdominal pressure.    Baseline  straining with BM even though consistency is too not firm.    Time  5    Period  Weeks    Status  New    Target Date  11/30/18        PT Long Term Goals - 10/26/18 0942      PT LONG TERM GOAL #1   Title  Patient will score at or below 52/300 on the PFDI and 20% on the  Female NIH-CPSI to demonstrate a clinically meaningful decrease in disability and distress due to pelvic floor dysfunction.    Baseline  PFDI: 107/300, Female NIH-CPSI: 37%    Time  10    Period  Weeks    Status  New    Target Date  01/04/19      PT LONG TERM GOAL #2   Title  Patient will report having BM's at least every-other day with consistency between [facility] stool scale 3-5 and without straining over the prior week to demonstrate decreased constipation.    Baseline  having a zbm every 3-5 days and not emptying completely though stool is appropriate consistency.    Time  10    Period  Weeks    Status  New    Target Date  01/04/19      PT LONG TERM GOAL #3   Title  Pt. will demonstrate ability to empty bladder consistently without straining or waiting longer than 5 seconds for urine stream to start to demonstrate improved control and relaxation of PFM and improved QOL.    Baseline  Pt. having to wait minutes to start flow of urine if she delays emptying at all.    Time  10    Period  Weeks    Status  New    Target Date  01/04/19             Plan - 10/25/18 1251    Clinical Impression Statement  Pt. is an 83 y/o female who presents today with cheif c/o difficulty emptying both bowel and bladder.  She has an involved PMH including moderate scoliosis, post-polio decreased smooth muscle activity, lumbar stenosis, bulging disk, anterolisthesis, osteopenia, OA, hysterectomy, double mastectomy, breast cancer, fibromyalgia, and Bronchiectasis. She is in the process of scheduling a procedure that is called "MILD" to help address the compression on her lumbar spine and will likely impact her bowel/bladder difficulties as well but it may also cause increased spasms in protective reaction from the procedure, TBD. THe extensive nature of her history precluded much objective assessment on today's visit but Pt's history corresponds with a pattern of increased tone and decreased coordination of  the pelvic floor muscles as well as spasms in the muscles surrounding the hips and low back which will benefit from skilled pelvic health PT to address for improved ability to empty bowel and bladder as well as improved QOl. Further assessment will be performed at follow-up appointment to continue to assess for and address other potential causes of pain and difficulty emptying.      History and Personal Factors relevant to plan of care:   moderate scoliosis, post-polio decreased smooth muscle activity, lumbar stenosis, bulging disk, anterolisthesis, osteopenia, OA, hysterectomy, double mastectomy, breast cancer, fibromyalgia, and Bronchiectasis    Clinical Presentation  Evolving    Clinical Presentation due to:  Pt. has upcoming procedure and further assessment necessary to achieve full picture of Pt. limitations.    Clinical Decision Making  High    Rehab Potential  Fair    Clinical Impairments Affecting Rehab Potential   moderate scoliosis, post-polio decreased smooth muscle activity, lumbar stenosis, bulging disk, anterolisthesis, osteopenia, OA, hysterectomy, double mastectomy, breast cancer, fibromyalgia, and Bronchiectasis    PT Frequency  1x / week    PT Duration  Other (comment)   10 weeks   PT Treatment/Interventions  ADLs/Self Care Home Management;Aquatic Therapy;Biofeedback;Moist Heat;Electrical Stimulation;Gait training;Therapeutic exercise;Therapeutic activities;Functional mobility training;Neuromuscular re-education;Patient/family education;Manual techniques;Scar mobilization;Dry needling;Passive range of motion;Taping;Joint Manipulations    PT Next Visit Plan  Assess PFM and train on pelvic tilts in seated with breathing and coordinated lengthening.    PT Home Exercise Plan  diaphragmatic Breathing and Squatty-potty, spinting with thumb.    Consulted and Agree with Plan of Care  Patient       Patient will benefit from skilled therapeutic intervention in order to improve the following  deficits and impairments:  Decreased balance, Decreased endurance, Increased muscle spasms, Cardiopulmonary status limiting activity, Decreased range of motion, Impaired tone, Improper body mechanics, Decreased activity tolerance, Decreased strength, Impaired flexibility, Postural dysfunction, Pain, Hypomobility  Visit Diagnosis: Other muscle spasm  Abnormal posture  Sacrococcygeal disorders, not elsewhere classified     Problem List Patient Active Problem List   Diagnosis Date Noted  . SOB (shortness of breath) 04/30/2014  . Leg swelling 04/30/2014  . Bronchiectasis without complication ([place]) 76/16/0737  . History of DVT (deep vein thrombosis) 04/30/2014  . Labile blood pressure 04/30/2014  . S/P IVC filter 04/30/2014  . History of right knee surgery 04/30/2014   Lindsey [name] DPT, ATC Lindsey [name] 10/26/2018, 9:55 AM  [place] MAIN [facility] SERVICES [address] [place], [place], [address] Phone: [phone]   Fax:  [phone]  Name: Lindsey [name] MRN: [id number] Date of Birth: 1936-06-25

## 2018-10-24 NOTE — Patient Instructions (Signed)
   Stabilization: Diaphragmatic Breathing    Lie with knees bent, feet flat. Place one hand on stomach, other on chest. Breathe deeply through nose, lifting belly hand without any motion of hand on chest.  * You can use your thumb inserted into the vagina to help straighten out the colon for ease of BM.

## 2018-10-25 DIAGNOSIS — M25511 Pain in right shoulder: Secondary | ICD-10-CM | POA: Diagnosis not present

## 2018-10-25 DIAGNOSIS — M542 Cervicalgia: Secondary | ICD-10-CM | POA: Diagnosis not present

## 2018-10-25 DIAGNOSIS — M25611 Stiffness of right shoulder, not elsewhere classified: Secondary | ICD-10-CM | POA: Diagnosis not present

## 2018-10-31 ENCOUNTER — Ambulatory Visit: Payer: PPO

## 2018-10-31 DIAGNOSIS — M533 Sacrococcygeal disorders, not elsewhere classified: Secondary | ICD-10-CM

## 2018-10-31 DIAGNOSIS — M62838 Other muscle spasm: Secondary | ICD-10-CM | POA: Diagnosis not present

## 2018-10-31 DIAGNOSIS — R293 Abnormal posture: Secondary | ICD-10-CM

## 2018-10-31 NOTE — Therapy (Signed)
[place] MAIN [facility] SERVICES [address] [place], [place], [address] Phone: [phone]   Fax:  [phone]  Physical Therapy Treatment  Patient Details  Name: Lindsey [name] MRN: [id number] Date of Birth: [birth year] Referring Provider (PT): Tammi Klippel   Encounter Date: 10/31/2018  PT End of Session - 11/02/18 0829    Visit Number  2    Number of Visits  10    Date for PT Re-Evaluation  01/03/19    Authorization - Visit Number  2    Authorization - Number of Visits  10    PT Start Time  1500    PT Stop Time  1600    PT Time Calculation (min)  60 min    Activity Tolerance  Patient tolerated treatment well    Behavior During Therapy  [facility] for tasks assessed/performed       Past Medical History:  Diagnosis Date  . Anemia   . Asthma   . Breast cancer ([place])   . Bronchiectasis ([place]) 06/2007  . Cancer ([place])   . Colitis   . COPD (chronic obstructive pulmonary disease) ([place])   . Depression   . DVT (deep venous thrombosis) ([place])    after her right knee surgery.   . Fibromyalgia   . Fibromyalgia   . Hypertension   . Migraine headache with aura   . Osteoarthritis   . Pneumonia   . Polio 1952    Past Surgical History:  Procedure Laterality Date  . ABDOMINAL HYSTERECTOMY    . BILATERAL TOTAL MASTECTOMY WITH AXILLARY LYMPH NODE DISSECTION    . CARPAL TUNNEL RELEASE     bilateral   . COLONOSCOPY WITH PROPOFOL N/A 08/02/2015   Procedure: COLONOSCOPY WITH PROPOFOL;  Surgeon: Manya Silvas, MD;  Location: [facility] ENDOSCOPY;  Service: Endoscopy;  Laterality: N/A;  . FINGER TENDON REPAIR    . HERNIA REPAIR    . REPLACEMENT TOTAL KNEE     right knee   . TONSILLECTOMY    . UMBILICAL HERNIA REPAIR    . VAGINAL HYSTERECTOMY      There were no vitals filed for this visit.      Pelvic Floor Physical Therapy Treatment Note  SCREENING  Changes in medications, allergies, or medical history?: Stopped taking  Effexor.   SUBJECTIVE  Patient reports: Is seeing a difference already using the squatty potty and having decreased straining, decreased hemorrhoids and improved ability to clean herself.  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 Bronchiectasis (NOT ASTHMA). L scoliosis, anterolisthesis of L5 on S1. Osteopenia, H/O breast CA with double mastectomy, Fibromyalgia, OA, .  Pain update:  Location of pain: neck, shoulders, R buttock and hands (OA) Current pain: 5/10  Max pain: 8/10 Least pain: 0/10 Nature of pain:heaviness, deep ache   Patient Goals: Be able to empty bowel an bladder without having to use her hand.    OBJECTIVE  Changes in:  Pelvic floor: External Exam: Introitus Appears: Gaping Skin integrity: WNL Palpation: no TTP Cough: Paradoxical Prolapse visible?: yes, posterior wall   Internal Vaginal Exam: Strength (PERF): 4+/5, 6 sec. 3 times Symmetry: more scar tissue on R, spasms on L. Palpation: TTP throughout but highly sensitive through fascia/scar tissue before reaching musculature on the R. Prolapse: posterior wall ~ 1/2 cm below the level of the introitus with maximal pressure. Scar mobility: decreased mobility and significant sensitivity on R internally.   Abdominal:  Able to coordinate pelvic tilt  With breathing  easily, minimal cues needed to layer PFM on.  Palpation: TTP to B adductors   INTERVENTIONS THIS SESSION: NM re-ed: Educated on and practiced pelvic tilt in seated and prone with coordination of pelvic tilt and  Breathing, sit-to-stand, soda-can theory, and supine-to-sit to allow for improved recruitment and control of muscles surrounding the pelvis for improved stability to allow PFM to relax and lengthen for improved recruitment and coordination. Educated on and practiced Pre-squeeze and sneeze to improve timing of PFM to support bladder with increased pressure for decreased prolapse. Manual: Assessed PFM  internally and educated Pt. On findings and implications for POC.  Total time: 60 min.                        PT Education - 11/02/18 1093    Education Details  See Pt. Instructions and Interventions this sesson.    Person(s) Educated  Patient    Methods  Explanation;Demonstration;Verbal cues;Tactile cues;Handout    Comprehension  Verbalized understanding;Returned demonstration;Verbal cues required;Tactile cues required;Need further instruction       PT Short Term Goals - 10/26/18 2355      PT SHORT TERM GOAL #1   Title  Patient will demonstrate improved pelvic alignment and balance of musculature surrounding the pelvis to facilitate decreased PFM spasms and decrease pelvic pain.    Baseline  scoliosis and spasms surrounding pelvis    Time  5    Period  Weeks    Status  New    Target Date  11/30/18      PT SHORT TERM GOAL #2   Title  Patient will demonstrate a coordinated contraction, relaxation, and bulge of the pelvic floor muscles to demonstrate functional recruitment and motion and allow for further strengthening.    Baseline  Pt. is unable to actively lengthen to allow for flow of urine or stool without straining most of the time.    Time  5    Period  Weeks    Status  New    Target Date  11/30/18      PT SHORT TERM GOAL #3   Title  Patient will report consistent use of foot-stool (squatty-potty) for positioning with BM to decrease pain with BM and intra-abdominal pressure.    Baseline  straining with BM even though consistency is too not firm.    Time  5    Period  Weeks    Status  New    Target Date  11/30/18        PT Long Term Goals - 10/26/18 0942      PT LONG TERM GOAL #1   Title  Patient will score at or below 52/300 on the PFDI and 20% on the Female NIH-CPSI to demonstrate a clinically meaningful decrease in disability and distress due to pelvic floor dysfunction.    Baseline  PFDI: 107/300, Female NIH-CPSI: 37%    Time  10    Period   Weeks    Status  New    Target Date  01/04/19      PT LONG TERM GOAL #2   Title  Patient will report having BM's at least every-other day with consistency between [facility] stool scale 3-5 and without straining over the prior week to demonstrate decreased constipation.    Baseline  having a zbm every 3-5 days and not emptying completely though stool is appropriate consistency.    Time  10    Period  Weeks    Status  New    Target Date  01/04/19      PT LONG TERM GOAL #3   Title  Pt. will demonstrate ability to empty bladder consistently without straining or waiting longer than 5 seconds for urine stream to start to demonstrate improved control and relaxation of PFM and improved QOL.    Baseline  Pt. having to wait minutes to start flow of urine if she delays emptying at all.    Time  10    Period  Weeks    Status  New    Target Date  01/04/19            Plan - 11/02/18 4975    Clinical Impression Statement  Pt. responded well to all interventions today, demonstrating understanding and correct performance of all education provided. Further assessment revealed POP, strong but spasmotic PFM, and moderate coordination of muscles actrng upon and within the PFM. Continue per POC.    Clinical Presentation  Evolving    Clinical Decision Making  High    Clinical Impairments Affecting Rehab Potential   moderate scoliosis, post-polio decreased smooth muscle activity, lumbar stenosis, bulging disk, anterolisthesis, osteopenia, OA, hysterectomy, double mastectomy, breast cancer, fibromyalgia, and Bronchiectasis    PT Frequency  1x / week    PT Duration  Other (comment)   10 weeks   PT Treatment/Interventions  ADLs/Self Care Home Management;Aquatic Therapy;Biofeedback;Moist Heat;Electrical Stimulation;Gait training;Therapeutic exercise;Therapeutic activities;Functional mobility training;Neuromuscular re-education;Patient/family education;Manual techniques;Scar mobilization;Dry needling;Passive  range of motion;Taping;Joint Manipulations    PT Next Visit Plan  Discuss DN vs. TP release for LB, TP release and scar work internally. TA in mod-quad and bow-and-arrow.    PT Home Exercise Plan  diaphragmatic Breathing and Squatty-potty, spinting with thumb,seated pelvic tilts, sit-to-stand, supine-to-sit, soda-can.    Consulted and Agree with Plan of Care  Patient       Patient will benefit from skilled therapeutic intervention in order to improve the following deficits and impairments:  Decreased balance, Decreased endurance, Increased muscle spasms, Cardiopulmonary status limiting activity, Decreased range of motion, Impaired tone, Improper body mechanics, Decreased activity tolerance, Decreased strength, Impaired flexibility, Postural dysfunction, Pain, Hypomobility  Visit Diagnosis: Other muscle spasm  Abnormal posture  Sacrococcygeal disorders, not elsewhere classified     Problem List Patient Active Problem List   Diagnosis Date Noted  . SOB (shortness of breath) 04/30/2014  . Leg swelling 04/30/2014  . Bronchiectasis without complication ([place]) 30/02/1101  . History of DVT (deep vein thrombosis) 04/30/2014  . Labile blood pressure 04/30/2014  . S/P IVC filter 04/30/2014  . History of right knee surgery 04/30/2014   Lindsey [name] DPT, ATC Lindsey [name] 11/02/2018, 8:36 AM  [place] MAIN [facility] SERVICES [address] [place], [place], [address] Phone: [phone]   Fax:  [phone]  Name: Lindsey [name] MRN: [id number] Date of Birth: Nov 18, 1935

## 2018-10-31 NOTE — Patient Instructions (Addendum)
   Sit, feet flat, scoot forward to the edge of the chair. Inhale as you bend forward at hips, begin to exhale just before and while you stand, contracting the glutes, lower tummy muscles and pelvic floor as if stopping urination as you stand up.   * Do this every time you sit or stand! If you catch yourself doing it "wrong, re-set and do it again so it can become habit!    Getting In/out of bed     Lying on back, bend left knee and place left arm across chest. Roll all in one movement to the right. Reverse to roll to the left. Always move as one unit.     Once you are lying on you side, move legs to edge of bed. Pull in the pelvic floor and lower tummy and push down with both hands while moving legs off bed to reach sitting position.  *Reverse sequence to return to lying down.  Copyright  VHI. All rights reserved.    EXHALE on EXERTION!    Pelvic Tilt With Pelvic Floor (Hook-Lying)        Lie with hips and knees bent. Squeeze pelvic floor and flatten low back while breathing out so that pelvis tilts. Repeat _10x2__ times. Do _1-2 times a day.  Put a pillow under your hips in this position when doing this exercise to help decrease pressure in the pelvis when it feels "heavy".

## 2018-11-01 DIAGNOSIS — M25511 Pain in right shoulder: Secondary | ICD-10-CM | POA: Diagnosis not present

## 2018-11-01 DIAGNOSIS — M542 Cervicalgia: Secondary | ICD-10-CM | POA: Diagnosis not present

## 2018-11-01 DIAGNOSIS — M25611 Stiffness of right shoulder, not elsewhere classified: Secondary | ICD-10-CM | POA: Diagnosis not present

## 2018-11-03 DIAGNOSIS — M25511 Pain in right shoulder: Secondary | ICD-10-CM | POA: Diagnosis not present

## 2018-11-03 DIAGNOSIS — M25611 Stiffness of right shoulder, not elsewhere classified: Secondary | ICD-10-CM | POA: Diagnosis not present

## 2018-11-03 DIAGNOSIS — M542 Cervicalgia: Secondary | ICD-10-CM | POA: Diagnosis not present

## 2018-11-07 ENCOUNTER — Ambulatory Visit: Payer: PPO | Attending: Internal Medicine

## 2018-11-07 DIAGNOSIS — M533 Sacrococcygeal disorders, not elsewhere classified: Secondary | ICD-10-CM | POA: Insufficient documentation

## 2018-11-07 DIAGNOSIS — R293 Abnormal posture: Secondary | ICD-10-CM | POA: Insufficient documentation

## 2018-11-07 DIAGNOSIS — M62838 Other muscle spasm: Secondary | ICD-10-CM | POA: Diagnosis not present

## 2018-11-07 NOTE — Therapy (Signed)
[place] MAIN [facility] SERVICES [address] [place], [place], [address] Phone: [phone]   Fax:  [phone]  Physical Therapy Treatment  Patient Details  Name: Lindsey [name] MRN: [id number] Date of Birth: Nov 14, 1935 Referring Provider (PT): Tammi Klippel   Encounter Date: 11/07/2018  PT End of Session - 11/07/18 1551    Visit Number  3    Number of Visits  10    Date for PT Re-Evaluation  01/03/19    Authorization - Visit Number  3    Authorization - Number of Visits  10    PT Start Time  1400    PT Stop Time  1515    PT Time Calculation (min)  75 min    Activity Tolerance  Patient tolerated treatment well    Behavior During Therapy  [facility] for tasks assessed/performed       Past Medical History:  Diagnosis Date  . Anemia   . Asthma   . Breast cancer ([place])   . Bronchiectasis ([place]) 06/2007  . Cancer ([place])   . Colitis   . COPD (chronic obstructive pulmonary disease) ([place])   . Depression   . DVT (deep venous thrombosis) ([place])    after her right knee surgery.   . Fibromyalgia   . Fibromyalgia   . Hypertension   . Migraine headache with aura   . Osteoarthritis   . Pneumonia   . Polio 1952    Past Surgical History:  Procedure Laterality Date  . ABDOMINAL HYSTERECTOMY    . BILATERAL TOTAL MASTECTOMY WITH AXILLARY LYMPH NODE DISSECTION    . CARPAL TUNNEL RELEASE     bilateral   . COLONOSCOPY WITH PROPOFOL N/A 08/02/2015   Procedure: COLONOSCOPY WITH PROPOFOL;  Surgeon: Manya Silvas, MD;  Location: [facility] ENDOSCOPY;  Service: Endoscopy;  Laterality: N/A;  . FINGER TENDON REPAIR    . HERNIA REPAIR    . REPLACEMENT TOTAL KNEE     right knee   . TONSILLECTOMY    . UMBILICAL HERNIA REPAIR    . VAGINAL HYSTERECTOMY      There were no vitals filed for this visit.    Pelvic Floor Physical Therapy Treatment Note  SCREENING  Changes in medications, allergies, or medical history?: no    SUBJECTIVE  Patient  reports: Feels that she is not having to push on the perineum as frequently and is actually getting the urge to have a BM more frequently, which she did not think she could feel anymore. She was not having much pain this morning, did not take her CBD oil as early as she normally would because her pain was low but she has had a little bit of a stressful morning and her pain is ramping up now. It will be at least a month before she can have the MILD procedure because her MD wants to do the ablation to her L knee first.  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 L scoliosis, anterolisthesis of L5 on S1. Osteopenia, H/O breast CA with double mastectomy, Fibromyalgia, OA,.  Pain update:  Location of pain: R>L low back  Current pain:  5/10  Max pain:  7/10 Least pain:  /10 Nature of pain: heaviness, deep ache  Patient Goals: Be able to empty bowel an bladder without having to use her hand.   OBJECTIVE  Changes in: Posture/Observations:  Pt. Lets knees fall into the center when sitting or standing but is making conscious effort  to apply education.  Palpation: TTP through R Glute min, Piriformis, and deep hip ER's.   INTERVENTIONS THIS SESSION: Manual: Performed TP release and STM to R Glute min to decrease pain and spasm and allow for decreased sciatic nerve pain.  Dry needle: Educated on the risks,  Benefits, and precaustions of DN for her and Performed TPDN with standard approach and .30x57mm needle to R Glute min to decrease pain and spasm and allow for decreased sciatic nerve pain as well as allow Pt. To experience DN in a less-reactive area to determine if she will respond appropriately without a fibromyalgia flare up for future POC determination Theract: reviewed "pre-squeeze and sneeze" and educated on how to hydrate and use gentle activity to decrease soreness from dry-needling. Reviewed sit-to-stand and emphasized keeping  knees apart to strengthen abductors and decrease over-activity of adductors. Educated on using the posterior pelvic tilts in hook-lying with her hips up to "reset" when she is having increased prolapse symptoms and in the middle to the day to help maintain improved POP positioning and less symptoms.    Total time: 44                         PT Education - 11/07/18 1550    Education Details  See Pt. Instructions and Interventions this session.    Person(s) Educated  Patient    Methods  Explanation;Demonstration;Verbal cues;Handout    Comprehension  Verbalized understanding;Returned demonstration;Verbal cues required       PT Short Term Goals - 10/26/18 0937      PT SHORT TERM GOAL #1   Title  Patient will demonstrate improved pelvic alignment and balance of musculature surrounding the pelvis to facilitate decreased PFM spasms and decrease pelvic pain.    Baseline  scoliosis and spasms surrounding pelvis    Time  5    Period  Weeks    Status  New    Target Date  11/30/18      PT SHORT TERM GOAL #2   Title  Patient will demonstrate a coordinated contraction, relaxation, and bulge of the pelvic floor muscles to demonstrate functional recruitment and motion and allow for further strengthening.    Baseline  Pt. is unable to actively lengthen to allow for flow of urine or stool without straining most of the time.    Time  5    Period  Weeks    Status  New    Target Date  11/30/18      PT SHORT TERM GOAL #3   Title  Patient will report consistent use of foot-stool (squatty-potty) for positioning with BM to decrease pain with BM and intra-abdominal pressure.    Baseline  straining with BM even though consistency is too not firm.    Time  5    Period  Weeks    Status  New    Target Date  11/30/18        PT Long Term Goals - 10/26/18 0942      PT LONG TERM GOAL #1   Title  Patient will score at or below 52/300 on the PFDI and 20% on the Female NIH-CPSI to  demonstrate a clinically meaningful decrease in disability and distress due to pelvic floor dysfunction.    Baseline  PFDI: 107/300, Female NIH-CPSI: 37%    Time  10    Period  Weeks    Status  New    Target Date  01/04/19  PT LONG TERM GOAL #2   Title  Patient will report having BM's at least every-other day with consistency between [facility] stool scale 3-5 and without straining over the prior week to demonstrate decreased constipation.    Baseline  having a zbm every 3-5 days and not emptying completely though stool is appropriate consistency.    Time  10    Period  Weeks    Status  New    Target Date  01/04/19      PT LONG TERM GOAL #3   Title  Pt. will demonstrate ability to empty bladder consistently without straining or waiting longer than 5 seconds for urine stream to start to demonstrate improved control and relaxation of PFM and improved QOL.    Baseline  Pt. having to wait minutes to start flow of urine if she delays emptying at all.    Time  10    Period  Weeks    Status  New    Target Date  01/04/19            Plan - 11/07/18 1551    Clinical Impression Statement  pt. responded well to all interventions today, demonstrating slow but appropriate response to TPDN and manual treatmnet for spasm reduction and understanding of all education provided. Continue per POC.    Clinical Presentation  Evolving    Clinical Decision Making  High    Rehab Potential  Fair    Clinical Impairments Affecting Rehab Potential   moderate scoliosis, post-polio decreased smooth muscle activity, lumbar stenosis, bulging disk, anterolisthesis, osteopenia, OA, hysterectomy, double mastectomy, breast cancer, fibromyalgia, and Bronchiectasis    PT Frequency  1x / week    PT Duration  Other (comment)   10 weeks   PT Treatment/Interventions  ADLs/Self Care Home Management;Aquatic Therapy;Biofeedback;Moist Heat;Electrical Stimulation;Gait training;Therapeutic exercise;Therapeutic  activities;Functional mobility training;Neuromuscular re-education;Patient/family education;Manual techniques;Scar mobilization;Dry needling;Passive range of motion;Taping;Joint Manipulations    PT Next Visit Plan  DN vs. TP release to lumbar region based on reaction following prior treatment. Internal release and scar work internally. TA in mod-quad and bow-and-arrow.    PT Home Exercise Plan  diaphragmatic Breathing and Squatty-potty, spinting with thumb,seated pelvic tilts, sit-to-stand, supine-to-sit, soda-can.    Consulted and Agree with Plan of Care  Patient       Patient will benefit from skilled therapeutic intervention in order to improve the following deficits and impairments:  Decreased balance, Decreased endurance, Increased muscle spasms, Cardiopulmonary status limiting activity, Decreased range of motion, Impaired tone, Improper body mechanics, Decreased activity tolerance, Decreased strength, Impaired flexibility, Postural dysfunction, Pain, Hypomobility  Visit Diagnosis: Other muscle spasm  Abnormal posture  Sacrococcygeal disorders, not elsewhere classified     Problem List Patient Active Problem List   Diagnosis Date Noted  . SOB (shortness of breath) 04/30/2014  . Leg swelling 04/30/2014  . Bronchiectasis without complication ([place]) 62/22/9798  . History of DVT (deep vein thrombosis) 04/30/2014  . Labile blood pressure 04/30/2014  . S/P IVC filter 04/30/2014  . History of right knee surgery 04/30/2014   Willa Rough DPT, ATC Willa Rough 11/07/2018, 3:54 PM  [place] MAIN [facility] SERVICES [address] [place], [place], [address] Phone: [phone]   Fax:  [phone]  Name: Lindsey [name] MRN: [id number] Date of Birth: Nov 10, 1935

## 2018-11-07 NOTE — Patient Instructions (Addendum)
*   Keep your knees apart as you sit and stand to help strengthen the outer hip.   "Pre-squeeze and sneeze"  Before you cough, sneeze, laugh etc. "pre-squeeze" the pelvic floor (kegel) and hold it until you finish coughing to retrain the muscles to hold the urine in during these activities.   At the mid-point in the day or if you are having a bad day for leakage etc. Try doing the pelvic tilts on your back WITH A PILLOW UNDER YOUR HIPS!   * make sure to drink plenty of water today and be gently active.

## 2018-11-08 ENCOUNTER — Other Ambulatory Visit: Payer: Self-pay | Admitting: Internal Medicine

## 2018-11-08 ENCOUNTER — Other Ambulatory Visit (HOSPITAL_COMMUNITY): Payer: Self-pay | Admitting: Internal Medicine

## 2018-11-08 DIAGNOSIS — R7989 Other specified abnormal findings of blood chemistry: Secondary | ICD-10-CM

## 2018-11-08 DIAGNOSIS — F332 Major depressive disorder, recurrent severe without psychotic features: Secondary | ICD-10-CM

## 2018-11-08 DIAGNOSIS — R110 Nausea: Secondary | ICD-10-CM

## 2018-11-08 DIAGNOSIS — R945 Abnormal results of liver function studies: Secondary | ICD-10-CM

## 2018-11-09 DIAGNOSIS — M1712 Unilateral primary osteoarthritis, left knee: Secondary | ICD-10-CM | POA: Diagnosis not present

## 2018-11-09 DIAGNOSIS — M48062 Spinal stenosis, lumbar region with neurogenic claudication: Secondary | ICD-10-CM | POA: Diagnosis not present

## 2018-11-09 DIAGNOSIS — G894 Chronic pain syndrome: Secondary | ICD-10-CM | POA: Diagnosis not present

## 2018-11-09 DIAGNOSIS — M25562 Pain in left knee: Secondary | ICD-10-CM | POA: Diagnosis not present

## 2018-11-09 DIAGNOSIS — G8929 Other chronic pain: Secondary | ICD-10-CM | POA: Diagnosis not present

## 2018-11-14 DIAGNOSIS — I10 Essential (primary) hypertension: Secondary | ICD-10-CM | POA: Diagnosis not present

## 2018-11-14 DIAGNOSIS — Z8612 Personal history of poliomyelitis: Secondary | ICD-10-CM | POA: Diagnosis not present

## 2018-11-14 DIAGNOSIS — M48062 Spinal stenosis, lumbar region with neurogenic claudication: Secondary | ICD-10-CM | POA: Diagnosis not present

## 2018-11-14 DIAGNOSIS — Z96651 Presence of right artificial knee joint: Secondary | ICD-10-CM | POA: Diagnosis not present

## 2018-11-14 DIAGNOSIS — Z91041 Radiographic dye allergy status: Secondary | ICD-10-CM | POA: Diagnosis not present

## 2018-11-14 DIAGNOSIS — M25511 Pain in right shoulder: Secondary | ICD-10-CM | POA: Diagnosis not present

## 2018-11-14 DIAGNOSIS — Z9013 Acquired absence of bilateral breasts and nipples: Secondary | ICD-10-CM | POA: Diagnosis not present

## 2018-11-14 DIAGNOSIS — G43909 Migraine, unspecified, not intractable, without status migrainosus: Secondary | ICD-10-CM | POA: Diagnosis not present

## 2018-11-14 DIAGNOSIS — Z885 Allergy status to narcotic agent status: Secondary | ICD-10-CM | POA: Diagnosis not present

## 2018-11-14 DIAGNOSIS — Z7951 Long term (current) use of inhaled steroids: Secondary | ICD-10-CM | POA: Diagnosis not present

## 2018-11-14 DIAGNOSIS — K589 Irritable bowel syndrome without diarrhea: Secondary | ICD-10-CM | POA: Diagnosis not present

## 2018-11-14 DIAGNOSIS — M797 Fibromyalgia: Secondary | ICD-10-CM | POA: Diagnosis not present

## 2018-11-14 DIAGNOSIS — M25512 Pain in left shoulder: Secondary | ICD-10-CM | POA: Diagnosis not present

## 2018-11-14 DIAGNOSIS — G894 Chronic pain syndrome: Secondary | ICD-10-CM | POA: Diagnosis not present

## 2018-11-14 DIAGNOSIS — M1712 Unilateral primary osteoarthritis, left knee: Secondary | ICD-10-CM | POA: Diagnosis not present

## 2018-11-14 DIAGNOSIS — M542 Cervicalgia: Secondary | ICD-10-CM | POA: Diagnosis not present

## 2018-11-14 DIAGNOSIS — Z85828 Personal history of other malignant neoplasm of skin: Secondary | ICD-10-CM | POA: Diagnosis not present

## 2018-11-14 DIAGNOSIS — Z882 Allergy status to sulfonamides status: Secondary | ICD-10-CM | POA: Diagnosis not present

## 2018-11-14 DIAGNOSIS — Z006 Encounter for examination for normal comparison and control in clinical research program: Secondary | ICD-10-CM | POA: Diagnosis not present

## 2018-11-14 DIAGNOSIS — Z79899 Other long term (current) drug therapy: Secondary | ICD-10-CM | POA: Diagnosis not present

## 2018-11-14 DIAGNOSIS — Z888 Allergy status to other drugs, medicaments and biological substances status: Secondary | ICD-10-CM | POA: Diagnosis not present

## 2018-11-21 ENCOUNTER — Ambulatory Visit: Payer: PPO

## 2018-11-21 DIAGNOSIS — M25512 Pain in left shoulder: Secondary | ICD-10-CM | POA: Diagnosis not present

## 2018-11-21 DIAGNOSIS — M48062 Spinal stenosis, lumbar region with neurogenic claudication: Secondary | ICD-10-CM | POA: Diagnosis not present

## 2018-11-21 DIAGNOSIS — M542 Cervicalgia: Secondary | ICD-10-CM | POA: Diagnosis not present

## 2018-11-21 DIAGNOSIS — G8929 Other chronic pain: Secondary | ICD-10-CM | POA: Diagnosis not present

## 2018-11-21 DIAGNOSIS — R51 Headache: Secondary | ICD-10-CM | POA: Diagnosis not present

## 2018-11-21 DIAGNOSIS — M25562 Pain in left knee: Secondary | ICD-10-CM | POA: Diagnosis not present

## 2018-11-21 DIAGNOSIS — M25511 Pain in right shoulder: Secondary | ICD-10-CM | POA: Diagnosis not present

## 2018-11-21 DIAGNOSIS — Z96651 Presence of right artificial knee joint: Secondary | ICD-10-CM | POA: Diagnosis not present

## 2018-11-21 NOTE — Progress Notes (Signed)
Cardiology Office Note  Date:  11/22/2018   ID:  Lindsey [name], Lindsey [name] 02/22/1936, MRN [id number]  PCP:  Tracie Harrier, MD   Chief Complaint  Patient presents with  . OTHER    12 month f/u c/o edema ankles. Meds reviewed verbally with pt.    HPI:  Lindsey [name] is a pleasant 83 year old woman with history of  lower extremity swelling,Secondary to venous insufficiency  prior right knee surgery,  right DVT, status post IVC filter,  bronchiectasis on inhalers and nebulizers at home  who presents for follow up of her labile blood pressure and her leg swelling.  Back surgery one week ago Reports that she has recovered well with improved symptoms Had headache for 4 days following the surgery, in bed  Still with labile pressure Systolic ranging 263 to 335K Rare 170s, rarely elevated  Medication regimen includes Cardura 1/2 pill BID losartan in the AM  on HCTZ, she takes predominately for ankle swelling  Does not want compression hose Having some dizzy spells, orthostasis  Lab work reviewed with her Total chol 208   EKG personally reviewed by myself on todays visit Shows normal sinus rhythm rate 62 bpm right bundle branch block left anterior fascicular block  Other past medical history reviewed visit to the emergency room ER 6/28,2018 Reported having dizziness and shortness of breath. Presented relatively acutely  Was drinking extra wine ,  stood up she felt dizzy/lightheaded and "breathing heavily".  Symptoms lasted approximately 2 hours  resolved.  Workup in the emergency room was benign Lab work as detailed below discussed with her Sodium 134, potassium 3.2, BUN 21 She does not take potassium supplement on a daily basis  Leg swelling History dates back many years but started with right knee surgery after an accident, fracture. She suffered a DVT, had IVC filter placed. Swelling worse at the end of the day, better in the morning. She does have significant  varicosities.   bronchiectasis diagnosed at outside facility.  stress test may 2015 which was reportedly normal  PMH:   has a past medical history of Anemia, Asthma, Breast cancer ([place]), Bronchiectasis ([place]) (06/2007), Cancer ([place]), Colitis, COPD (chronic obstructive pulmonary disease) ([place]), Depression, DVT (deep venous thrombosis) ([place]), Fibromyalgia, Fibromyalgia, Hypertension, Migraine headache with aura, Osteoarthritis, Pneumonia, and Polio (1952).  PSH:    Past Surgical History:  Procedure Laterality Date  . ABDOMINAL HYSTERECTOMY    . BACK SURGERY    . BILATERAL TOTAL MASTECTOMY WITH AXILLARY LYMPH NODE DISSECTION    . CARPAL TUNNEL RELEASE     bilateral   . COLONOSCOPY WITH PROPOFOL N/A 08/02/2015   Procedure: COLONOSCOPY WITH PROPOFOL;  Surgeon: Manya Silvas, MD;  Location: [facility] ENDOSCOPY;  Service: Endoscopy;  Laterality: N/A;  . FINGER TENDON REPAIR    . HERNIA REPAIR    . REPLACEMENT TOTAL KNEE     right knee   . TONSILLECTOMY    . UMBILICAL HERNIA REPAIR    . VAGINAL HYSTERECTOMY      Current Outpatient Medications  Medication Sig Dispense Refill  . albuterol (PROVENTIL HFA;VENTOLIN HFA) 108 (90 Base) MCG/ACT inhaler Inhale into the lungs every 6 (six) hours as needed for wheezing or shortness of breath.    . cyclobenzaprine (FLEXERIL) 5 MG tablet Take 5 mg by mouth as needed.     . doxazosin (CARDURA) 4 MG tablet Take 0.5 tablets (2 mg total) by mouth 2 (two) times daily. 90 tablet 3  . hydrALAZINE (APRESOLINE) 25 MG tablet Take  1 tablet (25 mg total) by mouth as needed (for BP >170). 30 tablet 6  . hydrochlorothiazide (HYDRODIURIL) 25 MG tablet TAKE 1 TABLET BY MOUTH DAILY 90 tablet 0  . hydrOXYzine (ATARAX/VISTARIL) 25 MG tablet Take 25 mg by mouth 3 (three) times daily as needed (Pt. taking 1/2 a pill).    Marland Kitchen losartan (COZAAR) 100 MG tablet TAKE ONE TABLET BY MOUTH EVERY DAY 90 tablet 3  . Omega-3 Fatty Acids (FISH OIL) 1000 MG CAPS Take 1,000 mg by mouth daily.     . polyethylene glycol (MIRALAX / GLYCOLAX) packet Take 17 g by mouth daily. Not taking regularly    . predniSONE (STERAPRED UNI-PAK 21 TAB) 10 MG (21) TBPK tablet Take by mouth as needed.    . rizatriptan (MAXALT) 10 MG tablet Take 10 mg by mouth as needed for migraine. May repeat in 2 hours if needed    . SYMBICORT 160-4.5 MCG/ACT inhaler Inhale 2 puffs into the lungs daily.     Marland Kitchen terconazole (TERAZOL 7) 0.4 % vaginal cream Place 1 applicator vaginally at bedtime.    Marland Kitchen venlafaxine (EFFEXOR) 37.5 MG tablet Take 37.5 mg by mouth 1 day or 1 dose.     No current facility-administered medications for this visit.      Allergies:   Ivp dye [iodinated diagnostic agents]; Codeine; Sulfa antibiotics; Tegaderm ag mesh [silver]; and Tetracyclines & related   Social History:  The patient  reports that she has never smoked. She has never used smokeless tobacco. She reports current alcohol use of about 1.0 standard drinks of alcohol per week. She reports that she does not use drugs.   Family History:   Family history is unknown by patient.    Review of Systems: Review of Systems  Constitutional: Negative.   Respiratory: Negative.   Cardiovascular: Negative.   Gastrointestinal: Negative.   Musculoskeletal: Positive for back pain.  Neurological: Positive for headaches.  Psychiatric/Behavioral: Negative.   All other systems reviewed and are negative.    PHYSICAL EXAM: VS:  BP 118/74 (BP Location: Left Arm, Patient Position: Sitting, Cuff Size: Normal)   Pulse 61   Ht 5\' 3"  (1.6 m)   Wt 123 lb 8 oz (56 kg)   BMI 21.88 kg/m  , BMI Body mass index is 21.88 kg/m.  Constitutional:  oriented to person, place, and time. No distress.  HENT:  Head: Normocephalic and atraumatic.  Eyes:  no discharge. No scleral icterus.  Neck: Normal range of motion. Neck supple. No JVD present.  Cardiovascular: Normal rate, regular rhythm, normal heart sounds and intact distal pulses. Exam reveals no gallop and  no friction rub. No edema No murmur heard. Pulmonary/Chest: Effort normal and breath sounds normal. No stridor. No respiratory distress.  no wheezes.  no rales.  no tenderness.  Abdominal: Soft.  no distension.  no tenderness.  Musculoskeletal: Normal range of motion.  no  tenderness or deformity.  Neurological:  normal muscle tone. Coordination normal. No atrophy Skin: Skin is warm and dry. No rash noted. not diaphoretic.  Psychiatric:  normal mood and affect. behavior is normal. Thought content normal.    Recent Labs: No results found for requested labs within last 8760 hours.    Lipid Panel No results found for: CHOL, HDL, LDLCALC, TRIG    Wt Readings from Last 3 Encounters:  11/22/18 123 lb 8 oz (56 kg)  10/13/17 129 lb 8 oz (58.7 kg)  04/15/17 125 lb 8 oz (56.9 kg)  ASSESSMENT AND PLAN:  Labile blood pressure Reasonable blood pressures but she reports they are somewhat labile No changes made to her medications, discussed with her in detail When dizzy recommended she hold the HCTZ for 1 day  History of DVT (deep vein thrombosis) No recurrent episodes, compression hose for high risk situations  Bronchiectasis without complication (HCC) Reports breathing is stable, recommended exercise program  Leg swelling Minimal swelling on today's visit but reports she has been in bed for 4 days She does take HCTZ but could be causing some of her low blood pressures and orthostasis  SOB (shortness of breath) Recommended walking program for conditioning  Long discussion with her concerning above  Total encounter time more than 25 minutes  Greater than 50% was spent in counseling and coordination of care with the patient   Disposition:   F/U  12 months   Orders Placed This Encounter  Procedures  . EKG 12-Lead     Signed, Esmond Plants, M.D., Ph.D. 11/22/2018  [place], [place]

## 2018-11-22 ENCOUNTER — Encounter: Payer: Self-pay | Admitting: Cardiovascular Disease

## 2018-11-22 ENCOUNTER — Ambulatory Visit (INDEPENDENT_AMBULATORY_CARE_PROVIDER_SITE_OTHER): Payer: PPO | Admitting: Cardiovascular Disease

## 2018-11-22 VITALS — BP 118/74 | HR 61 | Ht 63.0 in | Wt 123.5 lb

## 2018-11-22 DIAGNOSIS — M7989 Other specified soft tissue disorders: Secondary | ICD-10-CM

## 2018-11-22 DIAGNOSIS — R0602 Shortness of breath: Secondary | ICD-10-CM | POA: Diagnosis not present

## 2018-11-22 DIAGNOSIS — J479 Bronchiectasis, uncomplicated: Secondary | ICD-10-CM

## 2018-11-22 DIAGNOSIS — Z86718 Personal history of other venous thrombosis and embolism: Secondary | ICD-10-CM | POA: Diagnosis not present

## 2018-11-22 DIAGNOSIS — R0989 Other specified symptoms and signs involving the circulatory and respiratory systems: Secondary | ICD-10-CM | POA: Diagnosis not present

## 2018-11-22 NOTE — Patient Instructions (Signed)

## 2018-11-23 DIAGNOSIS — Z0000 Encounter for general adult medical examination without abnormal findings: Secondary | ICD-10-CM | POA: Diagnosis not present

## 2018-11-23 DIAGNOSIS — I10 Essential (primary) hypertension: Secondary | ICD-10-CM | POA: Diagnosis not present

## 2018-11-23 DIAGNOSIS — G43709 Chronic migraine without aura, not intractable, without status migrainosus: Secondary | ICD-10-CM | POA: Diagnosis not present

## 2018-11-23 DIAGNOSIS — R945 Abnormal results of liver function studies: Secondary | ICD-10-CM | POA: Diagnosis not present

## 2018-11-23 DIAGNOSIS — M5412 Radiculopathy, cervical region: Secondary | ICD-10-CM | POA: Diagnosis not present

## 2018-11-23 DIAGNOSIS — F331 Major depressive disorder, recurrent, moderate: Secondary | ICD-10-CM | POA: Diagnosis not present

## 2018-11-23 DIAGNOSIS — R51 Headache: Secondary | ICD-10-CM | POA: Diagnosis not present

## 2018-11-23 DIAGNOSIS — M797 Fibromyalgia: Secondary | ICD-10-CM | POA: Diagnosis not present

## 2018-11-23 DIAGNOSIS — R112 Nausea with vomiting, unspecified: Secondary | ICD-10-CM | POA: Diagnosis not present

## 2018-11-28 ENCOUNTER — Ambulatory Visit: Payer: PPO

## 2018-12-01 ENCOUNTER — Encounter: Payer: PPO | Admitting: Physical Therapy

## 2018-12-01 ENCOUNTER — Ambulatory Visit: Payer: PPO

## 2018-12-01 DIAGNOSIS — M62838 Other muscle spasm: Secondary | ICD-10-CM | POA: Diagnosis not present

## 2018-12-01 DIAGNOSIS — R293 Abnormal posture: Secondary | ICD-10-CM

## 2018-12-01 DIAGNOSIS — M533 Sacrococcygeal disorders, not elsewhere classified: Secondary | ICD-10-CM

## 2018-12-01 NOTE — Therapy (Signed)
[place] MAIN [facility] SERVICES [address] [place], [place], [address] Phone: [phone]   Fax:  [phone]  Physical Therapy Treatment  Patient Details  Name: Lindsey [name] MRN: [id number] Date of Birth: 1936-06-17 Referring Provider (PT): Tammi Klippel   Encounter Date: 12/01/2018  PT End of Session - 12/01/18 1402    Visit Number  4    Number of Visits  10    Date for PT Re-Evaluation  01/03/19    Authorization - Visit Number  4    Authorization - Number of Visits  10    PT Start Time  1110    PT Stop Time  1210    PT Time Calculation (min)  60 min    Activity Tolerance  Patient tolerated treatment well    Behavior During Therapy  [facility] for tasks assessed/performed       Past Medical History:  Diagnosis Date  . Anemia   . Asthma   . Breast cancer ([place])   . Bronchiectasis ([place]) 06/2007  . Cancer ([place])   . Colitis   . COPD (chronic obstructive pulmonary disease) ([place])   . Depression   . DVT (deep venous thrombosis) ([place])    after her right knee surgery.   . Fibromyalgia   . Fibromyalgia   . Hypertension   . Migraine headache with aura   . Osteoarthritis   . Pneumonia   . Polio 1952    Past Surgical History:  Procedure Laterality Date  . ABDOMINAL HYSTERECTOMY    . BACK SURGERY    . BILATERAL TOTAL MASTECTOMY WITH AXILLARY LYMPH NODE DISSECTION    . CARPAL TUNNEL RELEASE     bilateral   . COLONOSCOPY WITH PROPOFOL N/A 08/02/2015   Procedure: COLONOSCOPY WITH PROPOFOL;  Surgeon: Manya Silvas, MD;  Location: [facility] ENDOSCOPY;  Service: Endoscopy;  Laterality: N/A;  . FINGER TENDON REPAIR    . HERNIA REPAIR    . REPLACEMENT TOTAL KNEE     right knee   . TONSILLECTOMY    . UMBILICAL HERNIA REPAIR    . VAGINAL HYSTERECTOMY      There were no vitals filed for this visit.    Pelvic Floor Physical Therapy Treatment Note  SCREENING  Changes in medications, allergies, or medical history?: had MILD  procedure    SUBJECTIVE  Patient reports: After the surgery she was walking without pain, 2 days later the base of the head/neck started hurting really bad. At her post-op checkup it was decided that the position she was in during the procedure that caused the pain.   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 L scoliosis, anterolisthesis of L5 on S1. Osteopenia, H/O breast CA with double mastectomy, Fibromyalgia, OA,.  Pain update:  Location of pain: shoulders and low back Current pain:  5/10  Max pain:  9/10 Least pain:  0/10 Nature of pain: headache, tight/achy  Patient Goals: Be able to empty bowel an bladder without having to use her hand.   OBJECTIVE  Changes in: Posture/Observations:  Forward head and shoulders.  Abdominal:  ~ 2 + finger diastasis  Palpation: TTP to B sub-occipitals  INTERVENTIONS THIS SESSION: Self-care: Educated on and ran e-stim for transverse colon stimulation to improve motility for decreased constipation and straining and hemorrhoids. Educated on and practiced ILY colonic massage and educated on bowel retraining program to increase frequency of BMs for decreased constipation and straining.   Manual: Performed Sub-occipital release to  decrease spasms and pain in head/neck and allow for improved posture.  Total time: 60 min.                          PT Education - 12/01/18 1401    Education Details  See Pt. Instructions and Interventions this session    Person(s) Educated  Patient    Methods  Explanation;Demonstration;Tactile cues;Verbal cues;Handout    Comprehension  Verbalized understanding;Returned demonstration;Verbal cues required;Tactile cues required       PT Short Term Goals - 10/26/18 0937      PT SHORT TERM GOAL #1   Title  Patient will demonstrate improved pelvic alignment and balance of musculature surrounding the pelvis to facilitate decreased  PFM spasms and decrease pelvic pain.    Baseline  scoliosis and spasms surrounding pelvis    Time  5    Period  Weeks    Status  New    Target Date  11/30/18      PT SHORT TERM GOAL #2   Title  Patient will demonstrate a coordinated contraction, relaxation, and bulge of the pelvic floor muscles to demonstrate functional recruitment and motion and allow for further strengthening.    Baseline  Pt. is unable to actively lengthen to allow for flow of urine or stool without straining most of the time.    Time  5    Period  Weeks    Status  New    Target Date  11/30/18      PT SHORT TERM GOAL #3   Title  Patient will report consistent use of foot-stool (squatty-potty) for positioning with BM to decrease pain with BM and intra-abdominal pressure.    Baseline  straining with BM even though consistency is too not firm.    Time  5    Period  Weeks    Status  New    Target Date  11/30/18        PT Long Term Goals - 10/26/18 0942      PT LONG TERM GOAL #1   Title  Patient will score at or below 52/300 on the PFDI and 20% on the Female NIH-CPSI to demonstrate a clinically meaningful decrease in disability and distress due to pelvic floor dysfunction.    Baseline  PFDI: 107/300, Female NIH-CPSI: 37%    Time  10    Period  Weeks    Status  New    Target Date  01/04/19      PT LONG TERM GOAL #2   Title  Patient will report having BM's at least every-other day with consistency between [facility] stool scale 3-5 and without straining over the prior week to demonstrate decreased constipation.    Baseline  having a zbm every 3-5 days and not emptying completely though stool is appropriate consistency.    Time  10    Period  Weeks    Status  New    Target Date  01/04/19      PT LONG TERM GOAL #3   Title  Pt. will demonstrate ability to empty bladder consistently without straining or waiting longer than 5 seconds for urine stream to start to demonstrate improved control and relaxation of PFM and  improved QOL.    Baseline  Pt. having to wait minutes to start flow of urine if she delays emptying at all.    Time  10    Period  Weeks    Status  New  Target Date  01/04/19            Plan - 12/01/18 1402    Clinical Impression Statement  Pt. responded well to all interventions today, demonstrating understanding of all education provided and decreased pain in the head/neck as well as a borborymos response with application of TENS and colonic massage which indicates increased parastalsis activity. Continue per POC.    Clinical Presentation  Stable    Clinical Decision Making  High    Rehab Potential  Fair    Clinical Impairments Affecting Rehab Potential   moderate scoliosis, post-polio decreased smooth muscle activity, lumbar stenosis, bulging disk, anterolisthesis, osteopenia, OA, hysterectomy, double mastectomy, breast cancer, fibromyalgia, and Bronchiectasis    PT Frequency  1x / week    PT Duration  Other (comment)   10 weeks   PT Treatment/Interventions  ADLs/Self Care Home Management;Aquatic Therapy;Biofeedback;Moist Heat;Electrical Stimulation;Gait training;Therapeutic exercise;Therapeutic activities;Functional mobility training;Neuromuscular re-education;Patient/family education;Manual techniques;Scar mobilization;Dry needling;Passive range of motion;Taping;Joint Manipulations    PT Next Visit Plan  DN vs. TP release to lumbar region based on reaction following prior treatment. Internal release and scar work internally. TA in mod-quad and bow-and-arrow.    PT Home Exercise Plan  diaphragmatic Breathing and Squatty-potty, spinting with thumb,seated pelvic tilts, sit-to-stand, supine-to-sit, soda-can.    Consulted and Agree with Plan of Care  Patient       Patient will benefit from skilled therapeutic intervention in order to improve the following deficits and impairments:  Decreased balance, Decreased endurance, Increased muscle spasms, Cardiopulmonary status limiting  activity, Decreased range of motion, Impaired tone, Improper body mechanics, Decreased activity tolerance, Decreased strength, Impaired flexibility, Postural dysfunction, Pain, Hypomobility  Visit Diagnosis: Other muscle spasm  Abnormal posture  Sacrococcygeal disorders, not elsewhere classified     Problem List Patient Active Problem List   Diagnosis Date Noted  . SOB (shortness of breath) 04/30/2014  . Leg swelling 04/30/2014  . Bronchiectasis without complication ([place]) 38/88/2800  . History of DVT (deep vein thrombosis) 04/30/2014  . Labile blood pressure 04/30/2014  . S/P IVC filter 04/30/2014  . History of right knee surgery 04/30/2014   Willa Rough DPT, ATC Willa Rough 12/01/2018, 2:07 PM  [place] MAIN [facility] SERVICES [address] [place], [place], [address] Phone: [phone]   Fax:  [phone]  Name: Lindsey [name] MRN: [id number] Date of Birth: 07-02-1936

## 2018-12-01 NOTE — Patient Instructions (Signed)
Bowel retraining program: 1) Start by drinking a hot (optionally caffeinated) beverage 2) Do your "I love you" colonic massage  3) Go for a short walk (wear tens-unit at a "strong tingle" for 10-30 min. 4) Go to the toilet and sit with feet up on squatty potty and relaxing forward on your knees with tall spine. Take deep, lengthening, breaths and allow up to 10 minutes to have a BM without "straining" before you move on with the day.     The "I Love You" massage for your colon  Start by resting or lying quietly. 1. Using your fingertips, you apply light pressure in a stroking motion. 2. Start with your hands on the left hand side of your abdomen, below the rib cage, and stroke or make small circles down towards your left hip. This is the "I" of the "I Love You" massage. 3. Next, you are going to make the strokes in an upside down "L" shape. Run your fingertips from the right side of your upper abdomen, across under your ribs, and down the left side. 4. Now you are going to run through the whole path. This is the "U". Start on the bottom right of your abdomen. Stroke up the right side, across under the rib cage, and down the left side.   5. Finally, Using your fingertips, you apply light pressure in small circles  through the whole "U" path to "wake up" the smooth muscles of the intestines and get things moving.    Up the right, across under the rib cage, down the left and inwards, moving in a clockwise motion (if you are looking down upon your own abdomen)  Essentially you are massaging along the path of your large intestine. Our colon starts roughly in the bottom right of our abdomen, travels up the right hand side, turns and runs across below our rib cage, and then down the left side and in towards the pubic bone. When I teach this massage for people to do at home I have them start with 10 minutes. However, anecdotally, many people tell me 15-20 minutes really gets things going! After about  5 minutes of this massage my insides start gurgling and making noises. 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 A big problem is constipation resulting from either medication side effects, post surgical changes, or the fact that in general people in the hospital don't move as much (and exercise such as walking also helps regulate our digestive system). One of the first "exercises" I would teach them is how to do the "I Love You" abdominal massage. Time and time again I have people come back to me and say that massaging their abdominal tissue helped their digestive issues. Give it a try today!  *Adapted from article written by Gwenlyn Perking, PT, DPT

## 2018-12-07 ENCOUNTER — Ambulatory Visit
Admission: RE | Admit: 2018-12-07 | Discharge: 2018-12-07 | Disposition: A | Discharge status: Home / Self Care | Payer: PPO | Source: Ambulatory Visit | Attending: Internal Medicine | Admitting: Internal Medicine

## 2018-12-07 DIAGNOSIS — F332 Major depressive disorder, recurrent severe without psychotic features: Secondary | ICD-10-CM | POA: Diagnosis not present

## 2018-12-07 DIAGNOSIS — R109 Unspecified abdominal pain: Secondary | ICD-10-CM | POA: Diagnosis not present

## 2018-12-07 DIAGNOSIS — R110 Nausea: Secondary | ICD-10-CM | POA: Insufficient documentation

## 2018-12-07 DIAGNOSIS — R945 Abnormal results of liver function studies: Secondary | ICD-10-CM | POA: Diagnosis not present

## 2018-12-07 DIAGNOSIS — R7989 Other specified abnormal findings of blood chemistry: Secondary | ICD-10-CM

## 2018-12-07 DIAGNOSIS — R112 Nausea with vomiting, unspecified: Secondary | ICD-10-CM | POA: Diagnosis not present

## 2018-12-09 ENCOUNTER — Ambulatory Visit: Payer: PPO | Attending: Internal Medicine | Admitting: Physical Therapy

## 2018-12-09 DIAGNOSIS — M62838 Other muscle spasm: Secondary | ICD-10-CM | POA: Insufficient documentation

## 2018-12-09 DIAGNOSIS — M533 Sacrococcygeal disorders, not elsewhere classified: Secondary | ICD-10-CM | POA: Diagnosis not present

## 2018-12-09 DIAGNOSIS — R293 Abnormal posture: Secondary | ICD-10-CM | POA: Diagnosis not present

## 2018-12-09 NOTE — Therapy (Addendum)
[place] MAIN [facility] SERVICES [address] [place], [place], [address] Phone: [phone]   Fax:  [phone]  Physical Therapy Treatment  Patient Details  Name: Lindsey [name] MRN: [id number] Date of Birth: 1936-08-20 Referring Provider (PT): Tammi Klippel   Encounter Date: 12/09/2018    Past Medical History:  Diagnosis Date  . Anemia   . Asthma   . Breast cancer ([place])   . Bronchiectasis ([place]) 06/2007  . Cancer ([place])   . Colitis   . COPD (chronic obstructive pulmonary disease) ([place])   . Depression   . DVT (deep venous thrombosis) ([place])    after her right knee surgery.   . Fibromyalgia   . Fibromyalgia   . Hypertension   . Migraine headache with aura   . Osteoarthritis   . Pneumonia   . Polio 1952    Past Surgical History:  Procedure Laterality Date  . ABDOMINAL HYSTERECTOMY    . BACK SURGERY    . BILATERAL TOTAL MASTECTOMY WITH AXILLARY LYMPH NODE DISSECTION    . CARPAL TUNNEL RELEASE     bilateral   . COLONOSCOPY WITH PROPOFOL N/A 08/02/2015   Procedure: COLONOSCOPY WITH PROPOFOL;  Surgeon: Manya Silvas, MD;  Location: [facility] ENDOSCOPY;  Service: Endoscopy;  Laterality: N/A;  . FINGER TENDON REPAIR    . HERNIA REPAIR    . REPLACEMENT TOTAL KNEE     right knee   . TONSILLECTOMY    . UMBILICAL HERNIA REPAIR    . VAGINAL HYSTERECTOMY      There were no vitals filed for this visit.  Subjective Assessment - 12/16/18 1735    Subjective  Pt reported no change to incontinence Sx.  Having a  bowel movemet still hard.           PT Short Term Goals - 10/26/18 4818      PT SHORT TERM GOAL #1   Title  Patient will demonstrate improved pelvic alignment and balance of musculature surrounding the pelvis to facilitate decreased PFM spasms and decrease pelvic pain.    Baseline  scoliosis and spasms surrounding pelvis    Time  5    Period  Weeks    Status  New    Target Date  11/30/18      PT SHORT TERM GOAL #2    Title  Patient will demonstrate a coordinated contraction, relaxation, and bulge of the pelvic floor muscles to demonstrate functional recruitment and motion and allow for further strengthening.    Baseline  Pt. is unable to actively lengthen to allow for flow of urine or stool without straining most of the time.    Time  5    Period  Weeks    Status  New    Target Date  11/30/18      PT SHORT TERM GOAL #3   Title  Patient will report consistent use of foot-stool (squatty-potty) for positioning with BM to decrease pain with BM and intra-abdominal pressure.    Baseline  straining with BM even though consistency is too not firm.    Time  5    Period  Weeks    Status  New    Target Date  11/30/18        PT Long Term Goals - 10/26/18 0942      PT LONG TERM GOAL #1   Title  Patient will score at or below 52/300 on the PFDI and 20% on the Female NIH-CPSI to demonstrate  a clinically meaningful decrease in disability and distress due to pelvic floor dysfunction.    Baseline  PFDI: 107/300, Female NIH-CPSI: 37%    Time  10    Period  Weeks    Status  New    Target Date  01/04/19      PT LONG TERM GOAL #2   Title  Patient will report having BM's at least every-other day with consistency between [facility] stool scale 3-5 and without straining over the prior week to demonstrate decreased constipation.    Baseline  having a zbm every 3-5 days and not emptying completely though stool is appropriate consistency.    Time  10    Period  Weeks    Status  New    Target Date  01/04/19      PT LONG TERM GOAL #3   Title  Pt. will demonstrate ability to empty bladder consistently without straining or waiting longer than 5 seconds for urine stream to start to demonstrate improved control and relaxation of PFM and improved QOL.    Baseline  Pt. having to wait minutes to start flow of urine if she delays emptying at all.    Time  10    Period  Weeks    Status  New    Target Date  01/04/19             Plan - 12/09/18 1442    Clinical Impression Statement     Rehab Potential  Fair    Clinical Impairments Affecting Rehab Potential   moderate scoliosis, post-polio decreased smooth muscle activity, lumbar stenosis, bulging disk, anterolisthesis, osteopenia, OA, hysterectomy, double mastectomy, breast cancer, fibromyalgia, and Bronchiectasis    PT Frequency  1x / week    PT Duration  Other (comment)   10 weeks   PT Treatment/Interventions  ADLs/Self Care Home Management;Aquatic Therapy;Biofeedback;Moist Heat;Electrical Stimulation;Gait training;Therapeutic exercise;Therapeutic activities;Functional mobility training;Neuromuscular re-education;Patient/family education;Manual techniques;Scar mobilization;Dry needling;Passive range of motion;Taping;Joint Manipulations    PT Next Visit Plan  DN vs. TP release to lumbar region based on reaction following prior treatment. Internal release and scar work internally. TA in mod-quad and bow-and-arrow.    PT Home Exercise Plan  diaphragmatic Breathing and Squatty-potty, spinting with thumb,seated pelvic tilts, sit-to-stand, supine-to-sit, soda-can.    Consulted and Agree with Plan of Care  Patient       Patient will benefit from skilled therapeutic intervention in order to improve the following deficits and impairments:  Decreased balance, Decreased endurance, Increased muscle spasms, Cardiopulmonary status limiting activity, Decreased range of motion, Impaired tone, Improper body mechanics, Decreased activity tolerance, Decreased strength, Impaired flexibility, Postural dysfunction, Pain, Hypomobility  Visit Diagnosis: Other muscle spasm  Abnormal posture  Sacrococcygeal disorders, not elsewhere classified     Problem List Patient Active Problem List   Diagnosis Date Noted  . SOB (shortness of breath) 04/30/2014  . Leg swelling 04/30/2014  . Bronchiectasis without complication ([place]) 91/63/8466  . History of DVT (deep vein  thrombosis) 04/30/2014  . Labile blood pressure 04/30/2014  . S/P IVC filter 04/30/2014  . History of right knee surgery 04/30/2014    Jerl Mina ,PT, DPT, E-RYT  12/16/2018, 5:37 PM  [place] MAIN [facility] SERVICES [address] [place], [place], [address] Phone: [phone]   Fax:  [phone]  Name: TANGELIA SANSON MRN: [id number] Date of Birth: 1936/08/12

## 2018-12-09 NOTE — Patient Instructions (Signed)
   Handouts   Open book Deep core level 1 and 2

## 2018-12-13 ENCOUNTER — Encounter: Payer: PPO | Admitting: Physical Therapy

## 2018-12-15 ENCOUNTER — Other Ambulatory Visit: Payer: Self-pay

## 2018-12-15 ENCOUNTER — Ambulatory Visit: Payer: PPO | Admitting: Physical Therapy

## 2018-12-15 DIAGNOSIS — R293 Abnormal posture: Secondary | ICD-10-CM

## 2018-12-15 DIAGNOSIS — M62838 Other muscle spasm: Secondary | ICD-10-CM | POA: Diagnosis not present

## 2018-12-15 DIAGNOSIS — M533 Sacrococcygeal disorders, not elsewhere classified: Secondary | ICD-10-CM

## 2018-12-15 NOTE — Patient Instructions (Signed)
Stretch for pelvic floor   "v heels slide away and then back toward buttocks and then rock knee to slight ,  slide heel along at 11 o clock away from buttocks   10 reps   ___   Practice proper pelvic floor coordination  Inhale: expand pelvic floor muscles Exhale" "j" scoop, allow pelvic floor to close, lift first before belly sinks   ( not "draw abdominal muscle to spine" or strain with abdominal muscles")

## 2018-12-16 NOTE — Therapy (Addendum)
[place] MAIN [facility] SERVICES [address] [place], [place], [address] Phone: [phone]   Fax:  [phone]  Physical Therapy Treatment  Patient Details  Name: Lindsey [name] MRN: [id number] Date of Birth: 1936-03-03 Referring Provider (PT): Tammi Klippel   Encounter Date: 12/15/2018  PT End of Session - 12/16/18 1219    Visit Number  6    Number of Visits  10    Date for PT Re-Evaluation  01/03/19    Authorization - Visit Number  6    Authorization - Number of Visits  10    PT Start Time  1300    PT Stop Time  1400    PT Time Calculation (min)  60 min    Activity Tolerance  Patient tolerated treatment well    Behavior During Therapy  [facility] for tasks assessed/performed       Past Medical History:  Diagnosis Date  . Anemia   . Asthma   . Breast cancer ([place])   . Bronchiectasis ([place]) 06/2007  . Cancer ([place])   . Colitis   . COPD (chronic obstructive pulmonary disease) ([place])   . Depression   . DVT (deep venous thrombosis) ([place])    after her right knee surgery.   . Fibromyalgia   . Fibromyalgia   . Hypertension   . Migraine headache with aura   . Osteoarthritis   . Pneumonia   . Polio 1952    Past Surgical History:  Procedure Laterality Date  . ABDOMINAL HYSTERECTOMY    . BACK SURGERY    . BILATERAL TOTAL MASTECTOMY WITH AXILLARY LYMPH NODE DISSECTION    . CARPAL TUNNEL RELEASE     bilateral   . COLONOSCOPY WITH PROPOFOL N/A 08/02/2015   Procedure: COLONOSCOPY WITH PROPOFOL;  Surgeon: Manya Silvas, MD;  Location: [facility] ENDOSCOPY;  Service: Endoscopy;  Laterality: N/A;  . FINGER TENDON REPAIR    . HERNIA REPAIR    . REPLACEMENT TOTAL KNEE     right knee   . TONSILLECTOMY    . UMBILICAL HERNIA REPAIR    . VAGINAL HYSTERECTOMY      There were no vitals filed for this visit.  Subjective Assessment - 12/15/18 1315    Subjective Pt has been practicing the deep core exericse. Still having bowel issues.            [facility] PT Assessment - 12/16/18 1725      Observation/Other Assessments   Observations  excessive cues for proper technique in deep core, less ab overuse       Coordination   Gross Motor Movements are Fluid and Coordinated  --   limited diapphragmatic excursion, chest breathing,ab overuse     Palpation   Spinal mobility  hypomobility at Thoracic segments, tightness at rhomboids/ interspinals / upper trap B                 Pelvic Floor Special Questions - 12/16/18 1725    Diastasis Recti  less depth at linea alba,     Pelvic Floor Internal Exam  Pt consented verbally without contraindications     Exam Type  Vaginal    Palpation  Increased scar restrictions at 5-7 clock L > R, ilicoccocgyeus, ATLA B,  increased tenderness at B obt int         [place] Adult PT Treatment/Exercise - 12/16/18 1724      Neuro Re-ed    Neuro Re-ed Details   excessive tactil and verbal  and visual cues for less ab overuse with deep core coordination      Modalities   Modalities  Moist Heat                        [facility] Adult PT Treatment/Exercise - 12/15/18 1420      Neuro Re-ed    Neuro Re-ed Details   excessive tactil and verbal and visual cues for less ab overuse with deep core coordination      Modalities   Modalities  Moist Heat               PT Short Term Goals - 10/26/18 1779      PT SHORT TERM GOAL #1   Title  Patient will demonstrate improved pelvic alignment and balance of musculature surrounding the pelvis to facilitate decreased PFM spasms and decrease pelvic pain.    Baseline  scoliosis and spasms surrounding pelvis    Time  5    Period  Weeks    Status  New    Target Date  11/30/18      PT SHORT TERM GOAL #2   Title  Patient will demonstrate a coordinated contraction, relaxation, and bulge of the pelvic floor muscles to demonstrate functional recruitment and motion and allow for further strengthening.    Baseline  Pt. is unable to  actively lengthen to allow for flow of urine or stool without straining most of the time.    Time  5    Period  Weeks    Status  New    Target Date  11/30/18      PT SHORT TERM GOAL #3   Title  Patient will report consistent use of foot-stool (squatty-potty) for positioning with BM to decrease pain with BM and intra-abdominal pressure.    Baseline  straining with BM even though consistency is too not firm.    Time  5    Period  Weeks    Status  New    Target Date  11/30/18        PT Long Term Goals - 10/26/18 0942      PT LONG TERM GOAL #1   Title  Patient will score at or below 52/300 on the PFDI and 20% on the Female NIH-CPSI to demonstrate a clinically meaningful decrease in disability and distress due to pelvic floor dysfunction.    Baseline  PFDI: 107/300, Female NIH-CPSI: 37%    Time  10    Period  Weeks    Status  New    Target Date  01/04/19      PT LONG TERM GOAL #2   Title  Patient will report having BM's at least every-other day with consistency between [facility] stool scale 3-5 and without straining over the prior week to demonstrate decreased constipation.    Baseline  having a zbm every 3-5 days and not emptying completely though stool is appropriate consistency.    Time  10    Period  Weeks    Status  New    Target Date  01/04/19      PT LONG TERM GOAL #3   Title  Pt. will demonstrate ability to empty bladder consistently without straining or waiting longer than 5 seconds for urine stream to start to demonstrate improved control and relaxation of PFM and improved QOL.    Baseline  Pt. having to wait minutes to start flow of urine if she delays emptying at all.  Time  10    Period  Weeks    Status  New    Target Date  01/04/19            Plan - 12/16/18 1734    Clinical Impression Statement    Pt demo'd improved pelvic floor co-activation with deep core without less ab overuse after excessive tactile and verbal cues. Pt required intravaginal manual Tx  to minimize pelvic floor tightness/ tenderness. Diastasis recti is improving as pt demo'd less bearing down and overuse of oblique mm with exhalation and more cranial contraction with pelvic floor on exhalation. Anticipate bowel Sx will improve with these improvements achieved today.  Pt continues to benefit from skilled PT.     Rehab Potential  Fair    Clinical Impairments Affecting Rehab Potential   moderate scoliosis, post-polio decreased smooth muscle activity, lumbar stenosis, bulging disk, anterolisthesis, osteopenia, OA, hysterectomy, double mastectomy, breast cancer, fibromyalgia, and Bronchiectasis    PT Frequency  1x / week    PT Duration  Other (comment)   10 weeks   PT Treatment/Interventions  ADLs/Self Care Home Management;Aquatic Therapy;Biofeedback;Moist Heat;Electrical Stimulation;Gait training;Therapeutic exercise;Therapeutic activities;Functional mobility training;Neuromuscular re-education;Patient/family education;Manual techniques;Scar mobilization;Dry needling;Passive range of motion;Taping;Joint Manipulations    PT Next Visit Plan  DN vs. TP release to lumbar region based on reaction following prior treatment. Internal release and scar work internally. TA in mod-quad and bow-and-arrow.    PT Home Exercise Plan  diaphragmatic Breathing and Squatty-potty, spinting with thumb,seated pelvic tilts, sit-to-stand, supine-to-sit, soda-can.    Consulted and Agree with Plan of Care  Patient       Patient will benefit from skilled therapeutic intervention in order to improve the following deficits and impairments:  Decreased balance, Decreased endurance, Increased muscle spasms, Cardiopulmonary status limiting activity, Decreased range of motion, Impaired tone, Improper body mechanics, Decreased activity tolerance, Decreased strength, Impaired flexibility, Postural dysfunction, Pain, Hypomobility  Visit Diagnosis: Other muscle spasm  Abnormal posture  Sacrococcygeal disorders, not  elsewhere classified     Problem List Patient Active Problem List   Diagnosis Date Noted  . SOB (shortness of breath) 04/30/2014  . Leg swelling 04/30/2014  . Bronchiectasis without complication ([place]) 69/62/9528  . History of DVT (deep vein thrombosis) 04/30/2014  . Labile blood pressure 04/30/2014  . S/P IVC filter 04/30/2014  . History of right knee surgery 04/30/2014    Jerl Mina ,PT, DPT, E-RYT  12/16/2018, 5:34 PM  [place] MAIN [facility] SERVICES [address] [place], [place], [address] Phone: [phone]   Fax:  [phone]  Name: DELONA CLASBY MRN: [id number] Date of Birth: [birth year]

## 2018-12-16 NOTE — Therapy (Signed)
[place] MAIN [facility] SERVICES [address] [place], [place], [address] Phone: [phone]   Fax:  [phone]  Physical Therapy Treatment  Patient Details  Name: Lindsey [name] MRN: [id number] Date of Birth: [birth year] Referring Provider (PT): Tammi Klippel   Encounter Date: 12/09/2018    Past Medical History:  Diagnosis Date  . Anemia   . Asthma   . Breast cancer ([place])   . Bronchiectasis ([place]) 06/2007  . Cancer ([place])   . Colitis   . COPD (chronic obstructive pulmonary disease) ([place])   . Depression   . DVT (deep venous thrombosis) ([place])    after her right knee surgery.   . Fibromyalgia   . Fibromyalgia   . Hypertension   . Migraine headache with aura   . Osteoarthritis   . Pneumonia   . Polio 1952    Past Surgical History:  Procedure Laterality Date  . ABDOMINAL HYSTERECTOMY    . BACK SURGERY    . BILATERAL TOTAL MASTECTOMY WITH AXILLARY LYMPH NODE DISSECTION    . CARPAL TUNNEL RELEASE     bilateral   . COLONOSCOPY WITH PROPOFOL N/A 08/02/2015   Procedure: COLONOSCOPY WITH PROPOFOL;  Surgeon: Manya Silvas, MD;  Location: [facility] ENDOSCOPY;  Service: Endoscopy;  Laterality: N/A;  . FINGER TENDON REPAIR    . HERNIA REPAIR    . REPLACEMENT TOTAL KNEE     right knee   . TONSILLECTOMY    . UMBILICAL HERNIA REPAIR    . VAGINAL HYSTERECTOMY      There were no vitals filed for this visit.  Subjective Assessment - 12/16/18 1735    Subjective  Pt reported no change to incontinence Sx.  Having a  bowel movemet still hard.          [facility] PT Assessment - 12/16/18 1737      Observation/Other Assessments   Observations  excessive cues for proper technique in deep core, less ab overuse       Coordination   Gross Motor Movements are Fluid and Coordinated  --   limited diapphragmatic excursion, chest breathing,ab overuse     Palpation   Spinal mobility  hypomobility at Thoracic segments, tightness at rhomboids/  interspinals / upper trap B                 Pelvic Floor Special Questions - 12/16/18 1725    Diastasis Recti  less depth at linea alba,     Pelvic Floor Internal Exam  Pt consented verbally without contraindications     Exam Type  Vaginal    Palpation  Increased scar restrictions at 5-7 clock L > R, ilicoccocgyeus, ATLA B,  increased tenderness at B obt int         [facility] Adult PT Treatment/Exercise - 12/16/18 1737      Neuro Re-ed    Neuro Re-ed Details   see pt instructions, cued for less chest breathing, ab overuse with deep core coordination        Modalities   Modalities  Moist Heat   6 min during deep core training      Moist Heat Therapy   Moist Heat Location  --   posterior spine     Manual Therapy   Manual therapy comments  STM/MWM Grade III PA mob at throacic segments, promoting rotation, scapular downward rotation ( STM/ MWM at mm noted in assessments)  PT Short Term Goals - 10/26/18 6720      PT SHORT TERM GOAL #1   Title  Patient will demonstrate improved pelvic alignment and balance of musculature surrounding the pelvis to facilitate decreased PFM spasms and decrease pelvic pain.    Baseline  scoliosis and spasms surrounding pelvis    Time  5    Period  Weeks    Status  New    Target Date  11/30/18      PT SHORT TERM GOAL #2   Title  Patient will demonstrate a coordinated contraction, relaxation, and bulge of the pelvic floor muscles to demonstrate functional recruitment and motion and allow for further strengthening.    Baseline  Pt. is unable to actively lengthen to allow for flow of urine or stool without straining most of the time.    Time  5    Period  Weeks    Status  New    Target Date  11/30/18      PT SHORT TERM GOAL #3   Title  Patient will report consistent use of foot-stool (squatty-potty) for positioning with BM to decrease pain with BM and intra-abdominal pressure.    Baseline  straining with BM even though  consistency is too not firm.    Time  5    Period  Weeks    Status  New    Target Date  11/30/18        PT Long Term Goals - 10/26/18 0942      PT LONG TERM GOAL #1   Title  Patient will score at or below 52/300 on the PFDI and 20% on the Female NIH-CPSI to demonstrate a clinically meaningful decrease in disability and distress due to pelvic floor dysfunction.    Baseline  PFDI: 107/300, Female NIH-CPSI: 37%    Time  10    Period  Weeks    Status  New    Target Date  01/04/19      PT LONG TERM GOAL #2   Title  Patient will report having BM's at least every-other day with consistency between [facility] stool scale 3-5 and without straining over the prior week to demonstrate decreased constipation.    Baseline  having a zbm every 3-5 days and not emptying completely though stool is appropriate consistency.    Time  10    Period  Weeks    Status  New    Target Date  01/04/19      PT LONG TERM GOAL #3   Title  Pt. will demonstrate ability to empty bladder consistently without straining or waiting longer than 5 seconds for urine stream to start to demonstrate improved control and relaxation of PFM and improved QOL.    Baseline  Pt. having to wait minutes to start flow of urine if she delays emptying at all.    Time  10    Period  Weeks    Status  New    Target Date  01/04/19           P: Pt demo'd improved closure of rectus ab mm and more depression of lateral ribs, improved deep core coordination with less chest/ ab overuse. Pt demo'd decreased paraspinal/ medial scapular mm tightness after tolerating manual Tx without complaints. These improvements will help pt with improving intraabdominal pressure for better GI and bowel function.   Pt continues to benefit from skilled PT.  Plan - 12/16/18 1732    Rehab Potential  Fair  Clinical Impairments Affecting Rehab Potential   moderate scoliosis, post-polio decreased smooth muscle activity, lumbar stenosis, bulging disk,  anterolisthesis, osteopenia, OA, hysterectomy, double mastectomy, breast cancer, fibromyalgia, and Bronchiectasis    PT Frequency  1x / week    PT Duration  Other (comment)   10 weeks   PT Treatment/Interventions  ADLs/Self Care Home Management;Aquatic Therapy;Biofeedback;Moist Heat;Electrical Stimulation;Gait training;Therapeutic exercise;Therapeutic activities;Functional mobility training;Neuromuscular re-education;Patient/family education;Manual techniques;Scar mobilization;Dry needling;Passive range of motion;Taping;Joint Manipulations    PT Next Visit Plan  DN vs. TP release to lumbar region based on reaction following prior treatment. Internal release and scar work internally. TA in mod-quad and bow-and-arrow.    PT Home Exercise Plan  diaphragmatic Breathing and Squatty-potty, spinting with thumb,seated pelvic tilts, sit-to-stand, supine-to-sit, soda-can.    Consulted and Agree with Plan of Care  Patient       Patient will benefit from skilled therapeutic intervention in order to improve the following deficits and impairments:  Decreased balance, Decreased endurance, Increased muscle spasms, Cardiopulmonary status limiting activity, Decreased range of motion, Impaired tone, Improper body mechanics, Decreased activity tolerance, Decreased strength, Impaired flexibility, Postural dysfunction, Pain, Hypomobility  Visit Diagnosis: Other muscle spasm  Abnormal posture  Sacrococcygeal disorders, not elsewhere classified     Problem List Patient Active Problem List   Diagnosis Date Noted  . SOB (shortness of breath) 04/30/2014  . Leg swelling 04/30/2014  . Bronchiectasis without complication ([place]) 29/51/8841  . History of DVT (deep vein thrombosis) 04/30/2014  . Labile blood pressure 04/30/2014  . S/P IVC filter 04/30/2014  . History of right knee surgery 04/30/2014    Jerl Mina 12/16/2018, 5:38 PM  [place] MAIN [facility] SERVICES [address] [place], [place], [address] Phone: [phone]   Fax:  [phone]  Name: Lindsey [name] MRN: [id number] Date of Birth: [birth year]

## 2018-12-19 DIAGNOSIS — H353211 Exudative age-related macular degeneration, right eye, with active choroidal neovascularization: Secondary | ICD-10-CM | POA: Diagnosis not present

## 2018-12-20 ENCOUNTER — Encounter: Payer: PPO | Admitting: Physical Therapy

## 2018-12-20 ENCOUNTER — Other Ambulatory Visit: Payer: Self-pay | Admitting: Cardiovascular Disease

## 2018-12-22 ENCOUNTER — Ambulatory Visit: Payer: PPO | Admitting: Physical Therapy

## 2018-12-26 ENCOUNTER — Ambulatory Visit: Payer: PPO | Admitting: Physical Therapy

## 2018-12-27 ENCOUNTER — Encounter: Payer: PPO | Admitting: Physical Therapy

## 2018-12-29 ENCOUNTER — Encounter: Payer: PPO | Admitting: Physical Therapy

## 2019-01-03 ENCOUNTER — Encounter: Payer: PPO | Admitting: Physical Therapy

## 2019-01-05 ENCOUNTER — Encounter: Payer: PPO | Admitting: Physical Therapy

## 2019-01-10 ENCOUNTER — Ambulatory Visit: Payer: PPO | Admitting: Physical Therapy

## 2019-01-12 ENCOUNTER — Encounter: Payer: PPO | Admitting: Physical Therapy

## 2019-01-17 ENCOUNTER — Encounter: Payer: PPO | Admitting: Physical Therapy

## 2019-01-19 ENCOUNTER — Encounter: Payer: PPO | Admitting: Physical Therapy

## 2019-01-24 ENCOUNTER — Encounter: Payer: PPO | Admitting: Physical Therapy

## 2019-01-26 ENCOUNTER — Encounter: Payer: PPO | Admitting: Physical Therapy

## 2019-01-31 ENCOUNTER — Encounter: Payer: PPO | Admitting: Physical Therapy

## 2019-02-02 ENCOUNTER — Encounter: Payer: PPO | Admitting: Physical Therapy

## 2019-02-13 DIAGNOSIS — H353211 Exudative age-related macular degeneration, right eye, with active choroidal neovascularization: Secondary | ICD-10-CM | POA: Diagnosis not present

## 2019-02-14 ENCOUNTER — Other Ambulatory Visit: Payer: Self-pay

## 2019-02-14 NOTE — Patient Outreach (Signed)
[facility]) Care Management  02/14/2019  Lindsey [name] 1935/11/19 [id number]  Telephone screening Referral date: 02/14/19 Referral source: Nurse advise line follow up Referral reason: fall Insurance: [employer] advantage  ATTEMPT #1  Telephone call to patient regarding nurse advise line follow up. Unable to reach patient. HIPAA compliant voice message left with call back phone number. Marland Kitchen    PLAN: RNCM will attempt 2nd telephone call to patient within 4 business days.  RNCm will send patient [facility] care management outreach letter to attempt contact.   Quinn Plowman RN,BSN,CCM [facility] Telephonic  [phone]

## 2019-02-15 ENCOUNTER — Other Ambulatory Visit: Payer: Self-pay

## 2019-02-15 NOTE — Patient Outreach (Signed)
[place] [facility]) Care Management  02/15/2019  Lindsey [name] [birth year] [id number]  Telephone screening Referral date: 02/14/19 Referral source: Nurse advise line follow up Referral reason: fall Insurance: [employer] team advantage\  Telephone call to patient regarding nurse advice line. HIPAA verified.  RNCM introduced herself and explained reason for call.  Patient states she is doing great.  Patient states she spoke with her primary MD this morning and was reassured the discomfort she has been feeling near her breast was more of a pulled muscle.  Patient states she is doing well overall.  Patient states she lives at the [facility] in [place]. She states the staff are doing a great job with making sure they check the residents temperatures every other day and making sure they pick up patients groceries and delivers them to their door.   RNCM discussed COVID 19 precautions with patient and advised to contact doctor for symptoms.  Patient advised to call 911 for severe symptoms.   PLAN:  RNCM will close due to patient being assessed and having no further needs.   Quinn Plowman RN,BSN,CCM [facility] Telephonic  [phone]

## 2019-02-16 ENCOUNTER — Ambulatory Visit: Payer: Self-pay

## 2019-02-16 DIAGNOSIS — N644 Mastodynia: Secondary | ICD-10-CM | POA: Diagnosis not present

## 2019-02-16 DIAGNOSIS — I10 Essential (primary) hypertension: Secondary | ICD-10-CM | POA: Diagnosis not present

## 2019-02-16 DIAGNOSIS — M797 Fibromyalgia: Secondary | ICD-10-CM | POA: Diagnosis not present

## 2019-02-16 DIAGNOSIS — W010XXA Fall on same level from slipping, tripping and stumbling without subsequent striking against object, initial encounter: Secondary | ICD-10-CM | POA: Diagnosis not present

## 2019-02-16 DIAGNOSIS — F3340 Major depressive disorder, recurrent, in remission, unspecified: Secondary | ICD-10-CM | POA: Diagnosis not present

## 2019-02-16 DIAGNOSIS — Z9882 Breast implant status: Secondary | ICD-10-CM | POA: Diagnosis not present

## 2019-03-01 ENCOUNTER — Telehealth: Payer: PPO | Admitting: Family

## 2019-03-01 ENCOUNTER — Telehealth: Payer: Self-pay | Admitting: Physical Therapy

## 2019-03-01 DIAGNOSIS — R399 Unspecified symptoms and signs involving the genitourinary system: Secondary | ICD-10-CM | POA: Diagnosis not present

## 2019-03-01 DIAGNOSIS — R350 Frequency of micturition: Secondary | ICD-10-CM

## 2019-03-01 MED ORDER — CEPHALEXIN 500 MG PO CAPS: 500.0000 mg | ORAL_CAPSULE | Freq: Two times a day (BID) | ORAL | 0 refills | Status: DC

## 2019-03-01 NOTE — Telephone Encounter (Signed)
The patient has been contacted on 02/28/19 in regards to telehealth services. The patient expressed an interest in participating in telehealth visits. Patient has been informed that an [facility] support representative will be reaching out to them to verify their insurance benefits and for scheduling

## 2019-03-01 NOTE — Progress Notes (Signed)
We are sorry that you are not feeling well.  Here is how we plan to help!  Based on what you shared with me it looks like you most likely have a simple urinary tract infection.  A UTI (Urinary Tract Infection) is a bacterial infection of the bladder.  Since you are having frequency and burning we will treat as a UTI. Approximately 5 minutes was spent documenting and reviewing patient's chart.    Most cases of urinary tract infections are simple to treat but a key part of your care is to encourage you to drink plenty of fluids and watch your symptoms carefully.  I have prescribed Keflex 500 mg twice a day for 7 days.  Your symptoms should gradually improve. Call us if the burning in your urine worsens, you develop worsening fever, back pain or pelvic pain or if your symptoms do not resolve after completing the antibiotic.  Urinary tract infections can be prevented by drinking plenty of water to keep your body hydrated.  Also be sure when you wipe, wipe from front to back and don't hold it in!  If possible, empty your bladder every 4 hours.  Your e-visit answers were reviewed by a board certified advanced clinical practitioner to complete your personal care plan.  Depending on the condition, your plan could have included both over the counter or prescription medications.  If there is a problem please reply  once you have received a response from your provider.  Your safety is important to [place].  If you have drug allergies check your prescription carefully.    You can use MyChart to ask questions about today's visit, request a non-urgent call back, or ask for a work or school excuse for 24 hours related to this e-Visit. If it has been greater than 24 hours you will need to follow up with your provider, or enter a new e-Visit to address those concerns.   You will get an e-mail in the next two days asking about your experience.  I hope that your e-visit has been valuable and will speed your  recovery. Thank you for using e-visits.

## 2019-03-02 DIAGNOSIS — M1712 Unilateral primary osteoarthritis, left knee: Secondary | ICD-10-CM | POA: Diagnosis not present

## 2019-03-06 ENCOUNTER — Ambulatory Visit: Payer: PPO | Attending: Internal Medicine | Admitting: Physical Therapy

## 2019-03-06 ENCOUNTER — Other Ambulatory Visit: Payer: Self-pay

## 2019-03-06 DIAGNOSIS — R293 Abnormal posture: Secondary | ICD-10-CM | POA: Insufficient documentation

## 2019-03-06 DIAGNOSIS — M62838 Other muscle spasm: Secondary | ICD-10-CM | POA: Diagnosis not present

## 2019-03-06 DIAGNOSIS — M4125 Other idiopathic scoliosis, thoracolumbar region: Secondary | ICD-10-CM | POA: Diagnosis not present

## 2019-03-06 DIAGNOSIS — G8929 Other chronic pain: Secondary | ICD-10-CM | POA: Insufficient documentation

## 2019-03-06 DIAGNOSIS — M533 Sacrococcygeal disorders, not elsewhere classified: Secondary | ICD-10-CM | POA: Diagnosis not present

## 2019-03-06 DIAGNOSIS — M2170 Unequal limb length (acquired), unspecified site: Secondary | ICD-10-CM | POA: Insufficient documentation

## 2019-03-06 DIAGNOSIS — M25562 Pain in left knee: Secondary | ICD-10-CM | POA: Diagnosis not present

## 2019-03-06 NOTE — Therapy (Addendum)
[place] MAIN [facility] SERVICES [address] [place], [place], [address] Phone: [phone]   Fax:  [phone]  Physical Therapy Treatment Progress Note from 10/24/18 to 03/06/19  Patient Details  Name: PETER DAQUILA MRN: [id number] Date of Birth: 08-22-1936 Referring Provider (PT): Tammi Klippel   Encounter Date: 03/06/2019  PT End of Session - 03/06/19 1818    Visit Number  7    Number of Visits  10    Date for PT Re-Evaluation  05/15/19    Authorization - Visit Number  6    Authorization - Number of Visits  10    PT Start Time  1448    PT Stop Time  1856    PT Time Calculation (min)  55 min    Activity Tolerance  Patient tolerated treatment well    Behavior During Therapy  [facility] for tasks assessed/performed       Past Medical History:  Diagnosis Date  . Anemia   . Asthma   . Breast cancer ([place])   . Bronchiectasis ([place]) 06/2007  . Cancer ([place])   . Colitis   . COPD (chronic obstructive pulmonary disease) ([place])   . Depression   . DVT (deep venous thrombosis) ([place])    after her right knee surgery.   . Fibromyalgia   . Fibromyalgia   . Hypertension   . Migraine headache with aura   . Osteoarthritis   . Pneumonia   . Polio 1952    Past Surgical History:  Procedure Laterality Date  . ABDOMINAL HYSTERECTOMY    . BACK SURGERY    . BILATERAL TOTAL MASTECTOMY WITH AXILLARY LYMPH NODE DISSECTION    . CARPAL TUNNEL RELEASE     bilateral   . COLONOSCOPY WITH PROPOFOL N/A 08/02/2015   Procedure: COLONOSCOPY WITH PROPOFOL;  Surgeon: Manya Silvas, MD;  Location: [facility] ENDOSCOPY;  Service: Endoscopy;  Laterality: N/A;  . FINGER TENDON REPAIR    . HERNIA REPAIR    . REPLACEMENT TOTAL KNEE     right knee   . TONSILLECTOMY    . UMBILICAL HERNIA REPAIR    . VAGINAL HYSTERECTOMY      There were no vitals filed for this visit.  Subjective Assessment - 03/06/19 1525    Subjective  Pt's last session was 12/15/18. Pt has been  told she has microscopic lymphocytic colitis.  Loose stools came on suddenly 4-5 weeks ago.   Pt now has loose bowels like peanut butter. Pt has urge every 1 hour but at the second of going, there are no bowels, just enough to clean at the anal area. The area is getting sore.  Daily fluid intake: 32-64 fl oz of water. Eat lot fiber.  Eats with reading emails.   Pt had a fall Feb 14, 2019 in her yard onto her R breast, and currently she still feels discomfortable when leaning on her arm. It feels more muscular to her.            [facility] PT Assessment - 03/06/19 1614      Coordination   Gross Motor Movements are Fluid and Coordinated  --   standing: diaphragmatic excursion/ ab coactivation                Pelvic Floor Special Questions - 03/06/19 1614    Diastasis Recti  signficantly less with pt self-palpating along linea alba ,         [facility] Adult PT Treatment/Exercise - 03/06/19 1818  Therapeutic Activites    Other Therapeutic Activities  discussed ensuring proper water intake, eat without multitasking       Neuro Re-ed    Neuro Re-ed Details   see pt instructions, cued for propioception                PT Short Term Goals - 10/26/18 4174      PT SHORT TERM GOAL #1   Title  Patient will demonstrate improved pelvic alignment and balance of musculature surrounding the pelvis to facilitate decreased PFM spasms and decrease pelvic pain.    Baseline  scoliosis and spasms surrounding pelvis    Time  5    Period  Weeks    Status  achieved   Target Date  11/30/18      PT SHORT TERM GOAL #2   Title  Patient will demonstrate a coordinated contraction, relaxation, and bulge of the pelvic floor muscles to demonstrate functional recruitment and motion and allow for further strengthening.    Baseline  Pt. is unable to actively lengthen to allow for flow of urine or stool without straining most of the time.    Time  5    Period  Weeks    Status  Achieved    Target Date   11/30/18      PT SHORT TERM GOAL #3   Title  Patient will report consistent use of foot-stool (squatty-potty) for positioning with BM to decrease pain with BM and intra-abdominal pressure.    Baseline  straining with BM even though consistency is too not firm.    Time  5    Period  Weeks    Status  Achieved   Target Date  11/30/18        PT Long Term Goals - 03/06/19 1821      PT LONG TERM GOAL #1   Title  Patient will score at or below 52/300 on the PFDI and 20% on the Female NIH-CPSI to demonstrate a clinically meaningful decrease in disability and distress due to pelvic floor dysfunction.    Baseline  PFDI: 107/300, Female NIH-CPSI: 37%    Time  10    Period  Weeks    Status  On-going      PT LONG TERM GOAL #2   Title  Patient will report having BM's at least every-other day with consistency between [facility] stool scale 3-5 and without straining over the prior week to demonstrate decreased constipation.    Baseline  having a zbm every 3-5 days and not emptying completely though stool is appropriate consistency.    Time  10    Period  Weeks    Status  On-going      PT LONG TERM GOAL #3   Title  Pt. will demonstrate ability to empty bladder consistently without straining or waiting longer than 5 seconds for urine stream to start to demonstrate improved control and relaxation of PFM and improved QOL.    Baseline  Pt. having to wait minutes to start flow of urine if she delays emptying at all.    Time  10    Period  Weeks    Status  On-going            Plan - 03/06/19 1821    Clinical Impression Statement Pt has achieved her all of STG and progress well towards her goals.  Pt demo'd improved intraabdominal pressures system with resolved diastasis recti, increased diaphragmatic excursion and co-activation of abdominal/ pelvic floor mm.  Modified exercises for pelvic floor in standing positions. Decreased reps with standing PNF exercise due to knee pain. Pt continues to benefit  from skilled PT. Discussed eating habits to not multitask to improve motility. Continue to address her frequency, incomplete emptying, and shift from harder stools to looser stools. Pt continues to benefit from skilled PT through telehealth as pt is limited in staying confined to her assisted living community during [place] pandemic.   Provided telehealth session today. DPT connected with pt today by Webex video conference and verified that pt as the correct person using two identifiers. DPT discussed the limitations, risks, security and privacy concerns of performing an evaluation and management service by Webex and the availability of in person appointments.  DPT also discussed with the patient that there may be a patient responsible charge related to this service. The patient expressed understanding and agreed to proceed.  The patient's address was confirmed.  Identified to the patient that therapist is a licensed DPT in the state of [place].Verified phone #  to call in case of technical difficulties.   Pt had a fall Feb 14, 2019 in her yard onto her R breast, and currently she still feels discomfortable when leaning on her arm. It feels more muscular to her. Noted no bruising. Educated to pt continue to observing for any changes to the area and to report to doctors.  Pt voiced understanding. Plan to monitor her Sx from this fall.     Rehab Potential  Fair    Clinical Impairments Affecting Rehab Potential   moderate scoliosis, post-polio decreased smooth muscle activity, lumbar stenosis, bulging disk, anterolisthesis, osteopenia, OA, hysterectomy, double mastectomy, breast cancer, fibromyalgia, and Bronchiectasis    PT Frequency  1x / week    PT Duration  Other (comment)   10 weeks   PT Treatment/Interventions  ADLs/Self Care Home Management;Aquatic Therapy;Biofeedback;Moist Heat;Electrical Stimulation;Gait training;Therapeutic exercise;Therapeutic activities;Functional mobility training;Neuromuscular  re-education;Patient/family education;Manual techniques;Scar mobilization;Dry needling;Passive range of motion;Taping;Joint Manipulations    PT Next Visit Plan  DN vs. TP release to lumbar region based on reaction following prior treatment. Internal release and scar work internally. TA in mod-quad and bow-and-arrow.    PT Home Exercise Plan  diaphragmatic Breathing and Squatty-potty, spinting with thumb,seated pelvic tilts, sit-to-stand, supine-to-sit, soda-can.    Consulted and Agree with Plan of Care  Patient       Patient will benefit from skilled therapeutic intervention in order to improve the following deficits and impairments:  Decreased balance, Decreased endurance, Increased muscle spasms, Cardiopulmonary status limiting activity, Decreased range of motion, Impaired tone, Improper body mechanics, Decreased activity tolerance, Decreased strength, Impaired flexibility, Postural dysfunction, Pain, Hypomobility  Visit Diagnosis: Other muscle spasm  Abnormal posture  Sacrococcygeal disorders, not elsewhere classified     Problem List Patient Active Problem List   Diagnosis Date Noted  . SOB (shortness of breath) 04/30/2014  . Leg swelling 04/30/2014  . Bronchiectasis without complication ([place]) 63/10/6008  . History of DVT (deep vein thrombosis) 04/30/2014  . Labile blood pressure 04/30/2014  . S/P IVC filter 04/30/2014  . History of right knee surgery 04/30/2014    Jerl Mina 03/06/2019, 6:27 PM  [place] MAIN [facility] SERVICES [address] [place], [place], [address] Phone: [phone]   Fax:  [phone]  Name: LATOY LABRIOLA MRN: [id number] Date of Birth: [birth year]

## 2019-03-06 NOTE — Patient Instructions (Signed)
  Breathing standing/ seated, on back 10 reps    Practice proper pelvic floor coordination  Inhale: expand pelvic floor muscles Exhale" "j" scoop, allow pelvic floor to close, lift first before belly sinks   ( not "draw abdominal muscle to spine" or strain with abdominal muscles")    Standing:  10 reps on both sides x 3 x day     3 point tap   Feet are hip width Tap forward, center under hip not feet next to each other  Tap middle\, center  Tap back       _Figure-4  5rep ( with consideration of knee pain)   and then toe touch to the ground behind you along a diagonal

## 2019-03-13 ENCOUNTER — Ambulatory Visit: Payer: PPO | Admitting: Physical Therapy

## 2019-03-13 ENCOUNTER — Other Ambulatory Visit: Payer: Self-pay

## 2019-03-13 DIAGNOSIS — M533 Sacrococcygeal disorders, not elsewhere classified: Secondary | ICD-10-CM

## 2019-03-13 DIAGNOSIS — M62838 Other muscle spasm: Secondary | ICD-10-CM

## 2019-03-13 DIAGNOSIS — R293 Abnormal posture: Secondary | ICD-10-CM

## 2019-03-14 NOTE — Therapy (Addendum)
[place] MAIN [facility] SERVICES [address] [place], [place], [address] Phone: [phone]   Fax:  [phone]  Physical Therapy Treatment  Patient Details  Name: Lindsey [name] MRN: [id number] Date of Birth: 19-Sep-1936 Referring Provider (PT): Tammi Klippel   Encounter Date: 03/13/2019  PT End of Session - 03/14/19 0930    Visit Number  8    Number of Visits  10    Date for PT Re-Evaluation  05/15/19    Authorization - Visit Number  6    Authorization - Number of Visits  10    PT Start Time  8250    PT Stop Time  1625    PT Time Calculation (min)  55 min    Activity Tolerance  Patient tolerated treatment well    Behavior During Therapy  [facility] for tasks assessed/performed       Past Medical History:  Diagnosis Date  . Anemia   . Asthma   . Breast cancer ([place])   . Bronchiectasis ([place]) 06/2007  . Cancer ([place])   . Colitis   . COPD (chronic obstructive pulmonary disease) ([place])   . Depression   . DVT (deep venous thrombosis) ([place])    after her right knee surgery.   . Fibromyalgia   . Fibromyalgia   . Hypertension   . Migraine headache with aura   . Osteoarthritis   . Pneumonia   . Polio 1952    Past Surgical History:  Procedure Laterality Date  . ABDOMINAL HYSTERECTOMY    . BACK SURGERY    . BILATERAL TOTAL MASTECTOMY WITH AXILLARY LYMPH NODE DISSECTION    . CARPAL TUNNEL RELEASE     bilateral   . COLONOSCOPY WITH PROPOFOL N/A 08/02/2015   Procedure: COLONOSCOPY WITH PROPOFOL;  Surgeon: Manya Silvas, MD;  Location: [facility] ENDOSCOPY;  Service: Endoscopy;  Laterality: N/A;  . FINGER TENDON REPAIR    . HERNIA REPAIR    . REPLACEMENT TOTAL KNEE     right knee   . TONSILLECTOMY    . UMBILICAL HERNIA REPAIR    . VAGINAL HYSTERECTOMY      There were no vitals filed for this visit.  Subjective Assessment - 03/13/19 1531    Subjective  Pt reports firmer stools with change in her medication as recommended by her GI  (budesonide). She changed to 3 tablet instead of one table on May 29.  Pt is not straining too much. Pt still has to reach for her perineum to assist with bowel movements. Pt feels like her bowels are going the wrong way and she has to place 3 fingers at the perineum. Pt only has to do if she has not gone for 36 hours. Pt is feeling a bulging area when there is fecal area in her anus. Pt does not feel this bulging when she urinates.            [facility] PT Assessment - 03/14/19 0854      Floor to Stand   Comments  4 point method without difficulty, used chair and able to push on forearms       Posture/Postural Control   Posture Comments  plantigrade position over chair  for quick pelvic floor contractions,10 reps, not pain on wrists/hand                   [facility] Adult PT Treatment/Exercise - 03/14/19 5397      Neuro Re-ed    Neuro Re-ed Details  see pt instrictions, cued for coordination of pelvic floor in position to faciliate posterior mm . modified in plantigrade position instead of hooklying                PT Short Term Goals - 03/06/19 1826      PT SHORT TERM GOAL #1   Title  Patient will demonstrate improved pelvic alignment and balance of musculature surrounding the pelvis to facilitate decreased PFM spasms and decrease pelvic pain.    Baseline  scoliosis and spasms surrounding pelvis    Time  5    Period  Weeks    Status  Achieved    Target Date  11/30/18      PT SHORT TERM GOAL #2   Title  Patient will demonstrate a coordinated contraction, relaxation, and bulge of the pelvic floor muscles to demonstrate functional recruitment and motion and allow for further strengthening.    Baseline  Pt. is unable to actively lengthen to allow for flow of urine or stool without straining most of the time.    Time  5    Period  Weeks    Status  Achieved    Target Date  11/30/18      PT SHORT TERM GOAL #3   Title  Patient will report consistent use of foot-stool  (squatty-potty) for positioning with BM to decrease pain with BM and intra-abdominal pressure.    Baseline  straining with BM even though consistency is too not firm.    Time  5    Period  Weeks    Status  Achieved    Target Date  11/30/18        PT Long Term Goals - 03/06/19 1821      PT LONG TERM GOAL #1   Title  Patient will score at or below 52/300 on the PFDI and 20% on the Female NIH-CPSI to demonstrate a clinically meaningful decrease in disability and distress due to pelvic floor dysfunction.    Baseline  PFDI: 107/300, Female NIH-CPSI: 37%    Time  10    Period  Weeks    Status  On-going      PT LONG TERM GOAL #2   Title  Patient will report having BM's at least every-other day with consistency between [facility] stool scale 3-5 and without straining over the prior week to demonstrate decreased constipation.    Baseline  having a zbm every 3-5 days and not emptying completely though stool is appropriate consistency.    Time  10    Period  Weeks    Status  On-going      PT LONG TERM GOAL #3   Title  Pt. will demonstrate ability to empty bladder consistently without straining or waiting longer than 5 seconds for urine stream to start to demonstrate improved control and relaxation of PFM and improved QOL.    Baseline  Pt. having to wait minutes to start flow of urine if she delays emptying at all.    Time  10    Period  Weeks    Status  On-going            Plan - 03/14/19 0931    Clinical Impression Statement Pt showed improvements in these areas:                            _no dyscoordination of deep core  And was appropriate for pelvic floor strengthening ( 1  sec) in plantigrade position to facilitate more activation in posterior mm. Also educated pt on seated contractions. Anticipate pt will improve tensigrity of pelvic floor/abdominal/ diaphragm and progress to not having to splint with bowel movements.   Maintaining pelvic floor stretches to minimize overactivity  of pelvic floor mm.                          Added glut strengthening exercise which pt used to perform years ago.                        Anticipate these improvements will contribute to a better functioning intraabdominal pressure system for motility /postural stability. Pt continues to benefit from skilled PT.   Provided telehealth session today.DPT connected with pt today by Webex video conference and verified that pt as the correct person using two identifiers. DPT discussed the limitations, risks, security and privacy concerns of performing an evaluation and management service by Webex and the availability of in person appointments.  DPT also discussed with the patient that there may be a patient responsible charge related to this service. The patient expressed understanding and agreed to proceed.  The patient's address was confirmed.  Identified to the patient that therapist is a licensed DPT in the state of [place].Verified phone #  to call in case of technical difficulties.       Stability/Clinical Decision Making  Evolving/Moderate complexity    Clinical Decision Making  Moderate    Rehab Potential  Fair    Clinical Impairments Affecting Rehab Potential   moderate scoliosis, post-polio decreased smooth muscle activity, lumbar stenosis, bulging disk, anterolisthesis, osteopenia, OA, hysterectomy, double mastectomy, breast cancer, fibromyalgia, and Bronchiectasis    PT Frequency  1x / week    PT Duration  Other (comment)   10 weeks   PT Treatment/Interventions  ADLs/Self Care Home Management;Aquatic Therapy;Biofeedback;Moist Heat;Electrical Stimulation;Gait training;Therapeutic exercise;Therapeutic activities;Functional mobility training;Neuromuscular re-education;Patient/family education;Manual techniques;Scar mobilization;Dry needling;Passive range of motion;Taping;Joint Manipulations    PT Next Visit Plan  DN vs. TP release to lumbar region based on reaction following prior treatment.  Internal release and scar work internally. TA in mod-quad and bow-and-arrow.    PT Home Exercise Plan  diaphragmatic Breathing and Squatty-potty, spinting with thumb,seated pelvic tilts, sit-to-stand, supine-to-sit, soda-can.    Consulted and Agree with Plan of Care  Patient       Patient will benefit from skilled therapeutic intervention in order to improve the following deficits and impairments:  Decreased balance, Decreased endurance, Increased muscle spasms, Cardiopulmonary status limiting activity, Decreased range of motion, Impaired tone, Improper body mechanics, Decreased activity tolerance, Decreased strength, Impaired flexibility, Postural dysfunction, Pain, Hypomobility  Visit Diagnosis: Other muscle spasm  Abnormal posture  Sacrococcygeal disorders, not elsewhere classified     Problem List Patient Active Problem List   Diagnosis Date Noted  . SOB (shortness of breath) 04/30/2014  . Leg swelling 04/30/2014  . Bronchiectasis without complication ([place]) 56/38/9373  . History of DVT (deep vein thrombosis) 04/30/2014  . Labile blood pressure 04/30/2014  . S/P IVC filter 04/30/2014  . History of right knee surgery 04/30/2014    Jerl Mina ,PT, DPT, E-RYT  03/14/2019, 9:33 AM  [place] MAIN [facility] SERVICES [address] [place], [place], [address] Phone: [phone]   Fax:  [phone]  Name: KYLEE UMANA MRN: [id number] Date of Birth: 10-10-1935

## 2019-03-14 NOTE — Patient Instructions (Signed)
Prior to meals:  3- point taps 10 x reps   Leaning over chair :  Pelvic floor squeezes ( 1 sec contractions)

## 2019-03-20 DIAGNOSIS — I10 Essential (primary) hypertension: Secondary | ICD-10-CM | POA: Diagnosis not present

## 2019-03-20 DIAGNOSIS — R945 Abnormal results of liver function studies: Secondary | ICD-10-CM | POA: Diagnosis not present

## 2019-03-20 DIAGNOSIS — M5412 Radiculopathy, cervical region: Secondary | ICD-10-CM | POA: Diagnosis not present

## 2019-03-20 DIAGNOSIS — F331 Major depressive disorder, recurrent, moderate: Secondary | ICD-10-CM | POA: Diagnosis not present

## 2019-03-20 DIAGNOSIS — R112 Nausea with vomiting, unspecified: Secondary | ICD-10-CM | POA: Diagnosis not present

## 2019-03-20 DIAGNOSIS — G43709 Chronic migraine without aura, not intractable, without status migrainosus: Secondary | ICD-10-CM | POA: Diagnosis not present

## 2019-03-20 DIAGNOSIS — R51 Headache: Secondary | ICD-10-CM | POA: Diagnosis not present

## 2019-03-20 DIAGNOSIS — M797 Fibromyalgia: Secondary | ICD-10-CM | POA: Diagnosis not present

## 2019-03-22 ENCOUNTER — Other Ambulatory Visit: Payer: Self-pay

## 2019-03-22 ENCOUNTER — Ambulatory Visit: Payer: PPO | Admitting: Physical Therapy

## 2019-03-22 DIAGNOSIS — R293 Abnormal posture: Secondary | ICD-10-CM

## 2019-03-22 DIAGNOSIS — M62838 Other muscle spasm: Secondary | ICD-10-CM | POA: Diagnosis not present

## 2019-03-22 DIAGNOSIS — M533 Sacrococcygeal disorders, not elsewhere classified: Secondary | ICD-10-CM

## 2019-03-22 NOTE — Therapy (Signed)
[place] MAIN [facility] SERVICES [address] [place], [place], [address] Phone: [phone]   Fax:  [phone]  Physical Therapy Treatment  Patient Details  Name: Lindsey [name] MRN: [id number] Date of Birth: [birth year] Referring Provider (PT): Tammi Klippel   Encounter Date: 03/22/2019  PT End of Session - 03/22/19 1254    Visit Number  9    Number of Visits  10    Date for PT Re-Evaluation  05/15/19    Authorization - Visit Number  7    Authorization - Number of Visits  10    PT Start Time  5361    PT Stop Time  1114    PT Time Calculation (min)  44 min    Activity Tolerance  Patient tolerated treatment well    Behavior During Therapy  [facility] for tasks assessed/performed       Past Medical History:  Diagnosis Date  . Anemia   . Asthma   . Breast cancer ([place])   . Bronchiectasis ([place]) 06/2007  . Cancer ([place])   . Colitis   . COPD (chronic obstructive pulmonary disease) ([place])   . Depression   . DVT (deep venous thrombosis) ([place])    after her right knee surgery.   . Fibromyalgia   . Fibromyalgia   . Hypertension   . Migraine headache with aura   . Osteoarthritis   . Pneumonia   . Polio 1952    Past Surgical History:  Procedure Laterality Date  . ABDOMINAL HYSTERECTOMY    . BACK SURGERY    . BILATERAL TOTAL MASTECTOMY WITH AXILLARY LYMPH NODE DISSECTION    . CARPAL TUNNEL RELEASE     bilateral   . COLONOSCOPY WITH PROPOFOL N/A 08/02/2015   Procedure: COLONOSCOPY WITH PROPOFOL;  Surgeon: Manya Silvas, MD;  Location: [facility] ENDOSCOPY;  Service: Endoscopy;  Laterality: N/A;  . FINGER TENDON REPAIR    . HERNIA REPAIR    . REPLACEMENT TOTAL KNEE     right knee   . TONSILLECTOMY    . UMBILICAL HERNIA REPAIR    . VAGINAL HYSTERECTOMY      There were no vitals filed for this visit.  Subjective Assessment - 03/22/19 1036    Subjective  Pt reported she went to see the chiropractic and her head/ neck feels tight and  hurting.   Pt 's bowel movements have been fairly good. Pt has decreased back down to one pill as prescribed by her GI doctor. The stools are are firmer and she is not going regularly. Pt has to splint with 3 figners at perineum to eliminate stools when the stools are hard.  Pt reports this is a big improvement. Pt is also not struggling and straining.  Pt reported she had a R groin strain after falling from a movement when playing tennis. 1990.  Since that time, pt notices she guards it when sleeping and it hurts with stairs, riding the exercise bike, and anything to lift the R leg.Pt also had R TKA 2012 and that caused more groin pain. After her knee surgery, she reported where the leg attached to her hip hurt so badly.         [facility] PT Assessment - 03/22/19 1253      Observation/Other Assessments   Observations  postural staibility, abdomen less distended, R shoulder lower                    [facility] Adult PT Treatment/Exercise -  03/22/19 1249      Therapeutic Activites    Other Therapeutic Activities  discussed POC, referral to nutritionist, discussed next session in clinic to assess R groin issue                PT Short Term Goals - 03/22/19 1100      PT SHORT TERM GOAL #1   Title  Patient will demonstrate improved pelvic alignment and balance of musculature surrounding the pelvis to facilitate decreased PFM spasms and decrease pelvic pain.    Baseline  scoliosis and spasms surrounding pelvis    Time  5    Period  Weeks    Status  Achieved    Target Date  11/30/18      PT SHORT TERM GOAL #2   Title  Patient will demonstrate a coordinated contraction, relaxation, and bulge of the pelvic floor muscles to demonstrate functional recruitment and motion and allow for further strengthening.    Baseline  Pt. is unable to actively lengthen to allow for flow of urine or stool without straining most of the time.    Time  5    Period  Weeks    Status  Achieved    Target  Date  11/30/18      PT SHORT TERM GOAL #3   Title  Patient will report consistent use of foot-stool (squatty-potty) for positioning with BM to decrease pain with BM and intra-abdominal pressure.    Baseline  straining with BM even though consistency is too not firm.    Time  5    Period  Weeks    Status  Achieved    Target Date  11/30/18        PT Long Term Goals - 03/22/19 1057      PT LONG TERM GOAL #1   Title  Patient will score at or below 52/300 on the PFDI and 20% on the Female NIH-CPSI to demonstrate a clinically meaningful decrease in disability and distress due to pelvic floor dysfunction.    Baseline  PFDI: 107/300, Female NIH-CPSI: 37%    Time  10    Period  Weeks    Status  On-going      PT LONG TERM GOAL #2   Title  Patient will report having BM's at least every-other day with consistency between [facility] stool scale 3-5 and without straining over the prior week to demonstrate decreased constipation.    Baseline  having a zbm every 3-5 days and not emptying completely though stool is appropriate consistency.    Time  10    Period  Weeks    Status  On-going      PT LONG TERM GOAL #3   Title  Pt. will demonstrate ability to empty bladder consistently without straining or waiting longer than 5 seconds for urine stream to start to demonstrate improved control and relaxation of PFM and improved QOL.    Baseline  Pt. having to wait minutes to start flow of urine if she delays emptying at all.    Time  10    Period  Weeks    Status  On-going            Plan - 03/22/19 1254    Clinical Impression Statement  Pt is improving with bowel movements in terms of not needing to splint with her fingers at her perineum as often and not needing to strain. Pt is working with her GI doctor regarding the medications to have proper  stool consistency. Referred pt to nutritionist because pt has not worked with one before and would benefit from adding one to her interdisciplinary team  to continue improving bowel movements/ constipation.  Discussed needing to set up next session in person to assess further her long-time issues with R groin pain that occurs with riding recumbent bike, stairs, lifting her R leg which occurred after a fall when playing tennis in [place].  Plan to assess this persistent issue and relationship with pelvic floor issues/ scoliosis. Anticipate improvement will help pt return to fitness activities, bike riding, and stairs.   Provided telehealth session today.DPT connected with pt today by Webex video conference and verified that pt as the correct person using two identifiers. DPT discussed the limitations, risks, security and privacy concerns of performing an evaluation and management service by Webex and the availability of in person appointments.  DPT also discussed with the patient that there may be a patient responsible charge related to this service. The patient expressed understanding and agreed to proceed.  The patient's address was confirmed.  Identified to the patient that therapist is a licensed DPT in the state of [place].Verified phone #  to call in case of technical difficulties.  Pt continues to benefit from skilled PT.    Stability/Clinical Decision Making  Evolving/Moderate complexity    Rehab Potential  Fair    Clinical Impairments Affecting Rehab Potential   moderate scoliosis, post-polio decreased smooth muscle activity, lumbar stenosis, bulging disk, anterolisthesis, osteopenia, OA, hysterectomy, double mastectomy, breast cancer, fibromyalgia, and Bronchiectasis    PT Frequency  1x / week    PT Duration  Other (comment)   10 weeks   PT Treatment/Interventions  ADLs/Self Care Home Management;Aquatic Therapy;Biofeedback;Moist Heat;Electrical Stimulation;Gait training;Therapeutic exercise;Therapeutic activities;Functional mobility training;Neuromuscular re-education;Patient/family education;Manual techniques;Scar mobilization;Dry needling;Passive  range of motion;Taping;Joint Manipulations    PT Next Visit Plan  DN vs. TP release to lumbar region based on reaction following prior treatment. Internal release and scar work internally. TA in mod-quad and bow-and-arrow.    PT Home Exercise Plan  diaphragmatic Breathing and Squatty-potty, spinting with thumb,seated pelvic tilts, sit-to-stand, supine-to-sit, soda-can.    Consulted and Agree with Plan of Care  Patient       Patient will benefit from skilled therapeutic intervention in order to improve the following deficits and impairments:  Decreased balance, Decreased endurance, Increased muscle spasms, Cardiopulmonary status limiting activity, Decreased range of motion, Impaired tone, Improper body mechanics, Decreased activity tolerance, Decreased strength, Impaired flexibility, Postural dysfunction, Pain, Hypomobility  Visit Diagnosis: 1. Other muscle spasm   2. Abnormal posture   3. Sacrococcygeal disorders, not elsewhere classified        Problem List Patient Active Problem List   Diagnosis Date Noted  . SOB (shortness of breath) 04/30/2014  . Leg swelling 04/30/2014  . Bronchiectasis without complication ([place]) 98/33/8250  . History of DVT (deep vein thrombosis) 04/30/2014  . Labile blood pressure 04/30/2014  . S/P IVC filter 04/30/2014  . History of right knee surgery 04/30/2014    Jerl Mina 03/22/2019, 12:55 PM  [place] MAIN [facility] SERVICES [address] [place], [place], [address] Phone: [phone]   Fax:  [phone]  Name: Lindsey [name] MRN: [id number] Date of Birth: [birth year]

## 2019-03-27 DIAGNOSIS — G43709 Chronic migraine without aura, not intractable, without status migrainosus: Secondary | ICD-10-CM | POA: Diagnosis not present

## 2019-03-27 DIAGNOSIS — M1990 Unspecified osteoarthritis, unspecified site: Secondary | ICD-10-CM | POA: Diagnosis not present

## 2019-03-27 DIAGNOSIS — G8929 Other chronic pain: Secondary | ICD-10-CM | POA: Diagnosis not present

## 2019-03-27 DIAGNOSIS — M25562 Pain in left knee: Secondary | ICD-10-CM | POA: Diagnosis not present

## 2019-03-27 DIAGNOSIS — W010XXA Fall on same level from slipping, tripping and stumbling without subsequent striking against object, initial encounter: Secondary | ICD-10-CM | POA: Diagnosis not present

## 2019-03-27 DIAGNOSIS — S41112A Laceration without foreign body of left upper arm, initial encounter: Secondary | ICD-10-CM | POA: Diagnosis not present

## 2019-03-27 DIAGNOSIS — R945 Abnormal results of liver function studies: Secondary | ICD-10-CM | POA: Diagnosis not present

## 2019-03-27 DIAGNOSIS — M797 Fibromyalgia: Secondary | ICD-10-CM | POA: Diagnosis not present

## 2019-03-27 DIAGNOSIS — R51 Headache: Secondary | ICD-10-CM | POA: Diagnosis not present

## 2019-03-27 DIAGNOSIS — F3342 Major depressive disorder, recurrent, in full remission: Secondary | ICD-10-CM | POA: Diagnosis not present

## 2019-03-27 DIAGNOSIS — I10 Essential (primary) hypertension: Secondary | ICD-10-CM | POA: Diagnosis not present

## 2019-04-03 ENCOUNTER — Other Ambulatory Visit: Payer: Self-pay

## 2019-04-03 ENCOUNTER — Ambulatory Visit: Payer: PPO | Admitting: Physical Therapy

## 2019-04-03 DIAGNOSIS — R293 Abnormal posture: Secondary | ICD-10-CM

## 2019-04-03 DIAGNOSIS — M62838 Other muscle spasm: Secondary | ICD-10-CM | POA: Diagnosis not present

## 2019-04-03 DIAGNOSIS — M4125 Other idiopathic scoliosis, thoracolumbar region: Secondary | ICD-10-CM

## 2019-04-03 DIAGNOSIS — M533 Sacrococcygeal disorders, not elsewhere classified: Secondary | ICD-10-CM

## 2019-04-03 DIAGNOSIS — G8929 Other chronic pain: Secondary | ICD-10-CM

## 2019-04-03 DIAGNOSIS — M2170 Unequal limb length (acquired), unspecified site: Secondary | ICD-10-CM

## 2019-04-03 NOTE — Therapy (Addendum)
[place] MAIN [facility] SERVICES [address] [place], [place], [address] Phone: [phone]   Fax:  [phone]  Physical Therapy Treatment / Progress Note  10/24/18 - 04/03/19  Patient Details  Name: Lindsey [name] MRN: [id number] Date of Birth: May 04, 1936 Referring Provider (PT): Tammi Klippel   Encounter Date: 04/03/2019  PT End of Session - 04/03/19 1530    Visit Number  10    Number of Visits     Date for PT Re-Evaluation  05/15/19    Authorization - Visit Number    Authorization - Number of Visits     PT Start Time  3875    PT Stop Time  1620    PT Time Calculation (min)  57 min    Activity Tolerance  Patient tolerated treatment well    Behavior During Therapy  [facility] for tasks assessed/performed       Past Medical History:  Diagnosis Date  . Anemia   . Asthma   . Breast cancer ([place])   . Bronchiectasis ([place]) 06/2007  . Cancer ([place])   . Colitis   . COPD (chronic obstructive pulmonary disease) ([place])   . Depression   . DVT (deep venous thrombosis) ([place])    after her right knee surgery.   . Fibromyalgia   . Fibromyalgia   . Hypertension   . Migraine headache with aura   . Osteoarthritis   . Pneumonia   . Polio 1952    Past Surgical History:  Procedure Laterality Date  . ABDOMINAL HYSTERECTOMY    . BACK SURGERY    . BILATERAL TOTAL MASTECTOMY WITH AXILLARY LYMPH NODE DISSECTION    . CARPAL TUNNEL RELEASE     bilateral   . COLONOSCOPY WITH PROPOFOL N/A 08/02/2015   Procedure: COLONOSCOPY WITH PROPOFOL;  Surgeon: Manya Silvas, MD;  Location: [facility] ENDOSCOPY;  Service: Endoscopy;  Laterality: N/A;  . FINGER TENDON REPAIR    . HERNIA REPAIR    . REPLACEMENT TOTAL KNEE     right knee   . TONSILLECTOMY    . UMBILICAL HERNIA REPAIR    . VAGINAL HYSTERECTOMY      There were no vitals filed for this visit.  Subjective Assessment - 04/03/19 1526    Subjective  Pt reported she had 2 good weeks with decent sized bowel  movements every other day for the past 2 weeks. This week, pt feels like she has not emptied completely and she uses her hand to assist with bowel movements Pt will be seeing her orhtopedic doctor about her L knee. R groin issue has been okay lately. It causes her to not be able to go up a step, kicking in teh water at her pool aerobic class.         [facility] PT Assessment - 04/03/19 1531      Palpation   Spinal mobility  all spinal movements without pain nor limited ROM     SI assessment   significant tightness at L obt / ischial fossa, L adductor     Palpation comment  --      Ambulation/Gait   Gait Comments  extreme genu valgu on L, with shoe lift, less genu valgus                Pelvic Floor Special Questions - 04/03/19 1612    External Perineal Exam  through pants:  signficant tightness/ tenderness at obt/ ischial fossa , sacro coccygeal ligament/ coccygeus, obt int  [place] Adult PT Treatment/Exercise - 04/03/19 1531      Moist Heat Therapy   Number Minutes Moist Heat  5 Minutes    Moist Heat Location  --   sacrum/ perineum ( not billed)      Manual Therapy   Manual therapy comments  STM/MWM at problem areas noted at pelvic floor                PT Short Term Goals - 03/22/19 1100      PT SHORT TERM GOAL #1   Title  Patient will demonstrate improved pelvic alignment and balance of musculature surrounding the pelvis to facilitate decreased PFM spasms and decrease pelvic pain.    Baseline  scoliosis and spasms surrounding pelvis    Time  5    Period  Weeks    Status  Achieved    Target Date  11/30/18      PT SHORT TERM GOAL #2   Title  Patient will demonstrate a coordinated contraction, relaxation, and bulge of the pelvic floor muscles to demonstrate functional recruitment and motion and allow for further strengthening.    Baseline  Pt. is unable to actively lengthen to allow for flow of urine or stool without straining most of the time.    Time   5    Period  Weeks    Status  Achieved    Target Date  11/30/18      PT SHORT TERM GOAL #3   Title  Patient will report consistent use of foot-stool (squatty-potty) for positioning with BM to decrease pain with BM and intra-abdominal pressure.    Baseline  straining with BM even though consistency is too not firm.    Time  5    Period  Weeks    Status  Achieved    Target Date  11/30/18        PT Long Term Goals - 04/03/19 1835      PT LONG TERM GOAL #1   Title  Patient will score at or below 52/300 on the PFDI and 20% on the Female NIH-CPSI to demonstrate a clinically meaningful decrease in disability and distress due to pelvic floor dysfunction.    Baseline  PFDI: 107/300, Female NIH-CPSI: 37%   ( 6/29: PFDI 172/300, NIH-CPSI:  16% )    Time  10    Period  Weeks    Status  Partially Met      PT LONG TERM GOAL #2   Title  Patient will report having BM's at least every-other day with consistency between [facility] stool scale 3-5 and without straining over the prior week to demonstrate decreased constipation.    Baseline  having a zbm every 3-5 days and not emptying completely though stool is appropriate consistency.  (6/29:  regular BMs but with difficulty with complete emptying)    Time  10    Period  Weeks    Status  Partially Met      PT LONG TERM GOAL #3   Title  Pt. will demonstrate ability to empty bladder consistently without straining or waiting longer than 5 seconds for urine stream to start to demonstrate improved control and relaxation of PFM and improved QOL.    Baseline  Pt. having to wait minutes to start flow of urine if she delays emptying at all.    Time  10    Period  Weeks    Status  Achieved      PT LONG TERM GOAL #  4   Title  Pt will report decreased R groin pain and L knee pain by 50% with stair climbing/descending    Time  10    Period  Weeks    Status  New    Target Date  06/12/19      PT LONG TERM GOAL #5   Title  Pt will demo no tensions at R  ischial/ obturator fossa in order to cross her R ankle over L thigh    Time  4    Period  Weeks    Status  New    Target Date  05/01/19      Additional Long Term Goals   Additional Long Term Goals  Yes      PT LONG TERM GOAL #6   Title  Pt will report relying on splinting with bowel movements 50% of her bowel movements in order to maintain hygiene and improve bowel function    Time  8    Period  Weeks    Status  New    Target Date  05/29/19      PT LONG TERM GOAL #7   Title  Pt will demo less genu valgus on L knee and report with 50% less pain with descending stairs in order to navigate stair safely.    Time  10    Period  Weeks    Status  New    Target Date  06/12/19            Plan - 04/03/19 1854    Clinical Impression Statement  Pt is progressing well towards her goals with report of increased bowel movements frequency and better consistency of stools.  Pt has achieved 100% of STG and one LTG and partially meeting or working towards remaining goals. Pt's remaining deficits include having to splint at perineum to completely empty bowels with new body parts to focus on: chronic R groin and L knee pain which impact her ability to do certain exercises, walking , and stairs navigation.  These three deficits are associated with her scoliosis which is being treated. Her diastasis recti has resolved which has contributed to improved GI and bowel function. Her leg length difference was adjusted for today with shoe lift which helped to improve her R groin pain by 90% when ascending stairs and L knee pain decreased with gait with shoe lift.  Descending stairs remains difficult with L knee pain despite shoe lift and pt resorts to step -to pattern with body positioned at 45 deg to rail for safety.   Suspect pt's L genu valgus is a major contributing factor to L knee pain and the genu valgus is associated with leg length difference. Pt will be seeing an orthopedic doctor and plan to review  notes, communicate with provider for further assessment and proper Tx for L knee.  Manual Tx today, which pt toleated without increased pain, helped to increased hip mobility in B hip/ SIJ/ coccyx. B  hip ext/ER/abd and L hip ext strength increased post Tx.  Pt continues to benefit from skilled PT.       Stability/Clinical Decision Making  Evolving/Moderate complexity    Rehab Potential  Fair    Clinical Impairments Affecting Rehab Potential   moderate scoliosis, post-polio decreased smooth muscle activity, lumbar stenosis, bulging disk, anterolisthesis, osteopenia, OA, hysterectomy, double mastectomy, breast cancer, fibromyalgia, and Bronchiectasis    PT Frequency  1x / week    PT Duration  Other (comment)   10 weeks  PT Treatment/Interventions  ADLs/Self Care Home Management;Aquatic Therapy;Biofeedback;Moist Heat;Electrical Stimulation;Gait training;Therapeutic exercise;Therapeutic activities;Functional mobility training;Neuromuscular re-education;Patient/family education;Manual techniques;Scar mobilization;Dry needling;Passive range of motion;Taping;Joint Manipulations    PT Next Visit Plan  DN vs. TP release to lumbar region based on reaction following prior treatment. Internal release and scar work internally. TA in mod-quad and bow-and-arrow.    PT Home Exercise Plan  diaphragmatic Breathing and Squatty-potty, spinting with thumb,seated pelvic tilts, sit-to-stand, supine-to-sit, soda-can.    Consulted and Agree with Plan of Care  Patient       Patient will benefit from skilled therapeutic intervention in order to improve the following deficits and impairments:  Decreased balance, Decreased endurance, Increased muscle spasms, Cardiopulmonary status limiting activity, Decreased range of motion, Impaired tone, Improper body mechanics, Decreased activity tolerance, Decreased strength, Impaired flexibility, Postural dysfunction, Pain, Hypomobility  Visit Diagnosis: 1. Abnormal posture   2.  Sacrococcygeal disorders, not elsewhere classified   3. Other muscle spasm   4. Other idiopathic scoliosis, thoracolumbar region   5. Leg length discrepancy   6. Chronic pain of left knee        Problem List Patient Active Problem List   Diagnosis Date Noted  . SOB (shortness of breath) 04/30/2014  . Leg swelling 04/30/2014  . Bronchiectasis without complication ([place]) 30/85/6943  . History of DVT (deep vein thrombosis) 04/30/2014  . Labile blood pressure 04/30/2014  . S/P IVC filter 04/30/2014  . History of right knee surgery 04/30/2014    Jerl Mina 04/03/2019, 6:55 PM  [place] MAIN [facility] SERVICES [address] [place], [place], [address] Phone: [phone]   Fax:  [phone]  Name: Lindsey [name] MRN: [id number] Date of Birth: [birth year]

## 2019-04-03 NOTE — Patient Instructions (Signed)
Stretching pelvic floor   Happy baby pose:   grabbing thighs, knees pointed out  Breath to stretch the pelvic floor    ___  When doing the leg up stretches: Press shoulders down and head   ___  Wear shoe lift in L shoe

## 2019-04-04 NOTE — Addendum Note (Signed)
Addended by: Jerl Mina on: 04/04/2019 06:50 PM   Modules accepted: Orders

## 2019-04-04 NOTE — Addendum Note (Signed)
Addended by: Jerl Mina on: 04/04/2019 01:03 PM   Modules accepted: Orders

## 2019-04-06 DIAGNOSIS — M7062 Trochanteric bursitis, left hip: Secondary | ICD-10-CM | POA: Diagnosis not present

## 2019-04-06 DIAGNOSIS — M1712 Unilateral primary osteoarthritis, left knee: Secondary | ICD-10-CM | POA: Diagnosis not present

## 2019-04-10 ENCOUNTER — Ambulatory Visit: Payer: PPO | Admitting: Physical Therapy

## 2019-04-10 DIAGNOSIS — H353211 Exudative age-related macular degeneration, right eye, with active choroidal neovascularization: Secondary | ICD-10-CM | POA: Diagnosis not present

## 2019-04-12 ENCOUNTER — Other Ambulatory Visit: Payer: Self-pay

## 2019-04-12 ENCOUNTER — Ambulatory Visit: Payer: PPO | Attending: Internal Medicine | Admitting: Physical Therapy

## 2019-04-12 DIAGNOSIS — M25562 Pain in left knee: Secondary | ICD-10-CM | POA: Diagnosis not present

## 2019-04-12 DIAGNOSIS — R293 Abnormal posture: Secondary | ICD-10-CM | POA: Diagnosis not present

## 2019-04-12 DIAGNOSIS — M533 Sacrococcygeal disorders, not elsewhere classified: Secondary | ICD-10-CM | POA: Diagnosis not present

## 2019-04-12 DIAGNOSIS — M2170 Unequal limb length (acquired), unspecified site: Secondary | ICD-10-CM | POA: Diagnosis not present

## 2019-04-12 DIAGNOSIS — M62838 Other muscle spasm: Secondary | ICD-10-CM | POA: Insufficient documentation

## 2019-04-12 DIAGNOSIS — M4125 Other idiopathic scoliosis, thoracolumbar region: Secondary | ICD-10-CM

## 2019-04-12 DIAGNOSIS — G8929 Other chronic pain: Secondary | ICD-10-CM | POA: Diagnosis not present

## 2019-04-12 NOTE — Patient Instructions (Signed)
Multifidis twist  Band is on doorknob: seated  from door (facing perpendicular)   Twisting trunk without moving the hips and knees Hold band at the level of ribcage, elbows bent,shoulder blades roll back and down like squeezing a pencil under armpit    Exhale twist,.10-15 deg away from door without moving your hips/ knees. Continue to maintain equal weight through legs. Keep knee unlocked.  10 x 2   TO RIGHT ONLY   _______  L hip/pelvic floor stretch: Near edge of mattress, on belly  knee bent like riding a horse  10 reps  _____  USE HANDS to push off chair Sit to stand   Proper body mechanics with getting out of a chair to decrease strain  on back &pelvic floor   Avoid holding your breath when Getting out of the chair:  Scoot to front part of chair chair Heels behind feet, feet are hip width apart, nose over toes  Inhale like you are smelling roses Exhale to stand

## 2019-04-12 NOTE — Therapy (Signed)
[place] MAIN [facility] SERVICES [address] [place], [place], [address] Phone: [phone]   Fax:  [phone]  Physical Therapy Treatment  Patient Details  Name: Lindsey [name] MRN: [id number] Date of Birth: June 01, 1936 Referring Provider (PT): Tammi Klippel   Encounter Date: 04/12/2019  PT End of Session - 04/12/19 1211    Visit Number  11    Date for PT Re-Evaluation  06/12/19    PT Start Time  1115    PT Stop Time  1210    PT Time Calculation (min)  55 min    Activity Tolerance  Patient tolerated treatment well    Behavior During Therapy  [facility] for tasks assessed/performed       Past Medical History:  Diagnosis Date  . Anemia   . Asthma   . Breast cancer ([place])   . Bronchiectasis ([place]) 06/2007  . Cancer ([place])   . Colitis   . COPD (chronic obstructive pulmonary disease) ([place])   . Depression   . DVT (deep venous thrombosis) ([place])    after her right knee surgery.   . Fibromyalgia   . Fibromyalgia   . Hypertension   . Migraine headache with aura   . Osteoarthritis   . Pneumonia   . Polio 1952    Past Surgical History:  Procedure Laterality Date  . ABDOMINAL HYSTERECTOMY    . BACK SURGERY    . BILATERAL TOTAL MASTECTOMY WITH AXILLARY LYMPH NODE DISSECTION    . CARPAL TUNNEL RELEASE     bilateral   . COLONOSCOPY WITH PROPOFOL N/A 08/02/2015   Procedure: COLONOSCOPY WITH PROPOFOL;  Surgeon: Manya Silvas, MD;  Location: [facility] ENDOSCOPY;  Service: Endoscopy;  Laterality: N/A;  . FINGER TENDON REPAIR    . HERNIA REPAIR    . REPLACEMENT TOTAL KNEE     right knee   . TONSILLECTOMY    . UMBILICAL HERNIA REPAIR    . VAGINAL HYSTERECTOMY      There were no vitals filed for this visit.  Subjective Assessment - 04/12/19 1119    Subjective  Pt reported she felt good L knee for 2 days after last session. Afterwards, the thigh pain/ hip pain/ knee pain increased. Pt wore her shoe lift the whole time. Pt had a large bowel  movements after 3 days. Pt has been pushing on her perieum and tried breathing to have bowel movement. Pt notices if she pushes her hand from the back and from the left to the middle of the perineum, her bowel movements cleared better but still with "agony".  The consistency of stool has been softer which has been the improvement for the past month. Hulan Fess PT Assessment - 04/12/19 1125      Observation/Other Assessments   Observations  decreased shoelift height and pt reported less L hip pain      Sit to Stand   Comments  no hands, slight genu vvalgus      Strength   Overall Strength Comments  L knee flex 3/5, R 4+/5.        Palpation   SI assessment   L PSIS more posterior, limited in hip abd/ER . tenderenss at piriformis B, tightness at L obt int     Palpation comment  R QL decreased in bulk 2/2 scoliosis                   [facility] Adult PT Treatment/Exercise -  04/12/19 1201      Neuro Re-ed    Neuro Re-ed Details   cued for new HEP ( multifidis twist to R only) , excessive cues for proper sit to stand to minimize genu valgus       Moist Heat Therapy   Number Minutes Moist Heat  5 Minutes    Moist Heat Location  --   buttocks ( during seated new HEP)      Manual Therapy   Manual therapy comments  STM/MWM L obt int, PA mob Grade II                PT Short Term Goals - 03/22/19 1100      PT SHORT TERM GOAL #1   Title  Patient will demonstrate improved pelvic alignment and balance of musculature surrounding the pelvis to facilitate decreased PFM spasms and decrease pelvic pain.    Baseline  scoliosis and spasms surrounding pelvis    Time  5    Period  Weeks    Status  Achieved    Target Date  11/30/18      PT SHORT TERM GOAL #2   Title  Patient will demonstrate a coordinated contraction, relaxation, and bulge of the pelvic floor muscles to demonstrate functional recruitment and motion and allow for further strengthening.    Baseline  Pt. is  unable to actively lengthen to allow for flow of urine or stool without straining most of the time.    Time  5    Period  Weeks    Status  Achieved    Target Date  11/30/18      PT SHORT TERM GOAL #3   Title  Patient will report consistent use of foot-stool (squatty-potty) for positioning with BM to decrease pain with BM and intra-abdominal pressure.    Baseline  straining with BM even though consistency is too not firm.    Time  5    Period  Weeks    Status  Achieved    Target Date  11/30/18        PT Long Term Goals - 04/03/19 1835      PT LONG TERM GOAL #1   Title  Patient will score at or below 52/300 on the PFDI and 20% on the Female NIH-CPSI to demonstrate a clinically meaningful decrease in disability and distress due to pelvic floor dysfunction.    Baseline  PFDI: 107/300, Female NIH-CPSI: 37%   ( 6/29: PFDI 172/300, NIH-CPSI:  16% )    Time  10    Period  Weeks    Status  Partially Met      PT LONG TERM GOAL #2   Title  Patient will report having BM's at least every-other day with consistency between [facility] stool scale 3-5 and without straining over the prior week to demonstrate decreased constipation.    Baseline  having a zbm every 3-5 days and not emptying completely though stool is appropriate consistency.  (6/29:  regular BMs but with difficulty with complete emptying)    Time  10    Period  Weeks    Status  Partially Met      PT LONG TERM GOAL #3   Title  Pt. will demonstrate ability to empty bladder consistently without straining or waiting longer than 5 seconds for urine stream to start to demonstrate improved control and relaxation of PFM and improved QOL.    Baseline  Pt. having to wait minutes to start flow of  urine if she delays emptying at all.    Time  10    Period  Weeks    Status  Achieved      PT LONG TERM GOAL #4   Title  Pt will report decreased R groin pain and L knee pain by 50% with stair climbing/descending    Time  10    Period  Weeks     Status  New    Target Date  06/12/19      PT LONG TERM GOAL #5   Title  Pt will demo no tensions at R ischial/ obturator fossa in order to cross her R ankle over L thigh    Time  4    Period  Weeks    Status  New    Target Date  05/01/19      Additional Long Term Goals   Additional Long Term Goals  Yes      PT LONG TERM GOAL #6   Title  Pt will report relying on splinting with bowel movements 50% of her bowel movements in order to maintain hygiene and improve bowel function    Time  8    Period  Weeks    Status  New    Target Date  05/29/19      PT LONG TERM GOAL #7   Title  Pt will demo less genu valgus on L knee and report with 50% less pain with descending stairs in order to navigate stair safely.    Time  10    Period  Weeks    Status  New    Target Date  06/12/19            Plan - 04/12/19 1335    Clinical Impression Statement  Pt had increased pain with wearing shoelift in L shoe after 2 days post session. Today, decreased height of shoe lift and pt reported less pain. Manual Tx addressed decreasing coccygeus/ obturator int tightness L and increasing L SIJ mobility. Provided excessive cues for proper sit to stand to minimize knee pain. Pre Tx, hip/lateral thigh/ knee pain L was 8/10 and post Tx: pt reported 2/10.  Added scoliosis specific strengtehning exercise to bulk up R lumbar region with multifidis strengthening to R only in seated position. Anticipate addressing scoliosis will help minimize hypomobility on L SIJ.  Anticipate shoe lift at lower height will help minimize pain. Suspect pt's remaining bowel issues/ L hip/knee pain are related to leg length discrepancy / scoliosis with associated pelvic girdle/ coccyx malalignment. PLan to continue treating her deficits to help her towards her goals.    Stability/Clinical Decision Making  Evolving/Moderate complexity    Rehab Potential  Fair    Clinical Impairments Affecting Rehab Potential   moderate scoliosis,  post-polio decreased smooth muscle activity, lumbar stenosis, bulging disk, anterolisthesis, osteopenia, OA, hysterectomy, double mastectomy, breast cancer, fibromyalgia, and Bronchiectasis    PT Frequency  1x / week    PT Duration  Other (comment)   10 weeks   PT Treatment/Interventions  ADLs/Self Care Home Management;Aquatic Therapy;Biofeedback;Moist Heat;Electrical Stimulation;Gait training;Therapeutic exercise;Therapeutic activities;Functional mobility training;Neuromuscular re-education;Patient/family education;Manual techniques;Scar mobilization;Dry needling;Passive range of motion;Taping;Joint Manipulations    PT Next Visit Plan  DN vs. TP release to lumbar region based on reaction following prior treatment. Internal release and scar work internally. TA in mod-quad and bow-and-arrow.    PT Home Exercise Plan  diaphragmatic Breathing and Squatty-potty, spinting with thumb,seated pelvic tilts, sit-to-stand, supine-to-sit, soda-can.    Consulted and  Agree with Plan of Care  Patient       Patient will benefit from skilled therapeutic intervention in order to improve the following deficits and impairments:  Decreased balance, Decreased endurance, Increased muscle spasms, Cardiopulmonary status limiting activity, Decreased range of motion, Impaired tone, Improper body mechanics, Decreased activity tolerance, Decreased strength, Impaired flexibility, Postural dysfunction, Pain, Hypomobility  Visit Diagnosis: 1. Abnormal posture   2. Sacrococcygeal disorders, not elsewhere classified   3. Other muscle spasm   4. Other idiopathic scoliosis, thoracolumbar region   5. Leg length discrepancy   6. Chronic pain of left knee        Problem List Patient Active Problem List   Diagnosis Date Noted  . SOB (shortness of breath) 04/30/2014  . Leg swelling 04/30/2014  . Bronchiectasis without complication ([place]) 16/55/3748  . History of DVT (deep vein thrombosis) 04/30/2014  . Labile blood pressure  04/30/2014  . S/P IVC filter 04/30/2014  . History of right knee surgery 04/30/2014    Jerl Mina ,PT, DPT, E-RYT  04/12/2019, 1:42 PM  [place] MAIN [facility] SERVICES [address] [place], [place], [address] Phone: [phone]   Fax:  [phone]  Name: Lindsey [name] MRN: [id number] Date of Birth: 1936/08/26

## 2019-04-14 DIAGNOSIS — J019 Acute sinusitis, unspecified: Secondary | ICD-10-CM | POA: Diagnosis not present

## 2019-04-14 DIAGNOSIS — B9689 Other specified bacterial agents as the cause of diseases classified elsewhere: Secondary | ICD-10-CM | POA: Diagnosis not present

## 2019-04-14 DIAGNOSIS — J441 Chronic obstructive pulmonary disease with (acute) exacerbation: Secondary | ICD-10-CM | POA: Diagnosis not present

## 2019-04-14 DIAGNOSIS — Z03818 Encounter for observation for suspected exposure to other biological agents ruled out: Secondary | ICD-10-CM | POA: Diagnosis not present

## 2019-04-14 DIAGNOSIS — J209 Acute bronchitis, unspecified: Secondary | ICD-10-CM | POA: Diagnosis not present

## 2019-04-14 DIAGNOSIS — Z209 Contact with and (suspected) exposure to unspecified communicable disease: Secondary | ICD-10-CM | POA: Diagnosis not present

## 2019-04-17 ENCOUNTER — Ambulatory Visit: Payer: PPO | Admitting: Physical Therapy

## 2019-04-24 ENCOUNTER — Ambulatory Visit: Payer: PPO | Admitting: Physical Therapy

## 2019-05-01 ENCOUNTER — Ambulatory Visit: Payer: PPO | Admitting: Physical Therapy

## 2019-05-01 DIAGNOSIS — H353211 Exudative age-related macular degeneration, right eye, with active choroidal neovascularization: Secondary | ICD-10-CM | POA: Diagnosis not present

## 2019-05-03 DIAGNOSIS — N8189 Other female genital prolapse: Secondary | ICD-10-CM | POA: Diagnosis not present

## 2019-05-03 DIAGNOSIS — K5900 Constipation, unspecified: Secondary | ICD-10-CM | POA: Diagnosis not present

## 2019-05-03 DIAGNOSIS — R350 Frequency of micturition: Secondary | ICD-10-CM | POA: Diagnosis not present

## 2019-05-03 DIAGNOSIS — K52832 Lymphocytic colitis: Secondary | ICD-10-CM | POA: Diagnosis not present

## 2019-05-03 DIAGNOSIS — R1032 Left lower quadrant pain: Secondary | ICD-10-CM | POA: Diagnosis not present

## 2019-06-01 DIAGNOSIS — R42 Dizziness and giddiness: Secondary | ICD-10-CM | POA: Diagnosis not present

## 2019-06-01 DIAGNOSIS — R531 Weakness: Secondary | ICD-10-CM | POA: Diagnosis not present

## 2019-06-01 DIAGNOSIS — J029 Acute pharyngitis, unspecified: Secondary | ICD-10-CM | POA: Diagnosis not present

## 2019-06-01 DIAGNOSIS — K5900 Constipation, unspecified: Secondary | ICD-10-CM | POA: Diagnosis not present

## 2019-06-01 DIAGNOSIS — M1990 Unspecified osteoarthritis, unspecified site: Secondary | ICD-10-CM | POA: Diagnosis not present

## 2019-06-01 DIAGNOSIS — M797 Fibromyalgia: Secondary | ICD-10-CM | POA: Diagnosis not present

## 2019-06-01 DIAGNOSIS — R1032 Left lower quadrant pain: Secondary | ICD-10-CM | POA: Diagnosis not present

## 2019-06-01 DIAGNOSIS — R1031 Right lower quadrant pain: Secondary | ICD-10-CM | POA: Diagnosis not present

## 2019-06-01 DIAGNOSIS — F3341 Major depressive disorder, recurrent, in partial remission: Secondary | ICD-10-CM | POA: Diagnosis not present

## 2019-06-02 DIAGNOSIS — R531 Weakness: Secondary | ICD-10-CM | POA: Diagnosis not present

## 2019-06-02 DIAGNOSIS — R1031 Right lower quadrant pain: Secondary | ICD-10-CM | POA: Diagnosis not present

## 2019-06-02 DIAGNOSIS — F3341 Major depressive disorder, recurrent, in partial remission: Secondary | ICD-10-CM | POA: Diagnosis not present

## 2019-06-02 DIAGNOSIS — M797 Fibromyalgia: Secondary | ICD-10-CM | POA: Diagnosis not present

## 2019-06-02 DIAGNOSIS — R42 Dizziness and giddiness: Secondary | ICD-10-CM | POA: Diagnosis not present

## 2019-06-02 DIAGNOSIS — M1990 Unspecified osteoarthritis, unspecified site: Secondary | ICD-10-CM | POA: Diagnosis not present

## 2019-06-02 DIAGNOSIS — R1032 Left lower quadrant pain: Secondary | ICD-10-CM | POA: Diagnosis not present

## 2019-06-02 DIAGNOSIS — J029 Acute pharyngitis, unspecified: Secondary | ICD-10-CM | POA: Diagnosis not present

## 2019-06-02 DIAGNOSIS — K5900 Constipation, unspecified: Secondary | ICD-10-CM | POA: Diagnosis not present

## 2019-06-05 ENCOUNTER — Other Ambulatory Visit: Payer: Self-pay | Admitting: Internal Medicine

## 2019-06-05 DIAGNOSIS — R17 Unspecified jaundice: Secondary | ICD-10-CM

## 2019-06-07 ENCOUNTER — Other Ambulatory Visit: Payer: Self-pay

## 2019-06-07 ENCOUNTER — Ambulatory Visit
Admission: RE | Admit: 2019-06-07 | Discharge: 2019-06-07 | Disposition: A | Discharge status: Home / Self Care | Payer: PPO | Source: Ambulatory Visit | Attending: Internal Medicine | Admitting: Internal Medicine

## 2019-06-07 DIAGNOSIS — R17 Unspecified jaundice: Secondary | ICD-10-CM | POA: Diagnosis not present

## 2019-06-13 DIAGNOSIS — R17 Unspecified jaundice: Secondary | ICD-10-CM | POA: Diagnosis not present

## 2019-06-13 DIAGNOSIS — D72829 Elevated white blood cell count, unspecified: Secondary | ICD-10-CM | POA: Diagnosis not present

## 2019-06-13 DIAGNOSIS — E871 Hypo-osmolality and hyponatremia: Secondary | ICD-10-CM | POA: Diagnosis not present

## 2019-06-13 DIAGNOSIS — E876 Hypokalemia: Secondary | ICD-10-CM | POA: Diagnosis not present

## 2019-06-13 DIAGNOSIS — R1032 Left lower quadrant pain: Secondary | ICD-10-CM | POA: Diagnosis not present

## 2019-06-13 DIAGNOSIS — R1031 Right lower quadrant pain: Secondary | ICD-10-CM | POA: Diagnosis not present

## 2019-06-13 DIAGNOSIS — M810 Age-related osteoporosis without current pathological fracture: Secondary | ICD-10-CM | POA: Diagnosis not present

## 2019-06-22 DIAGNOSIS — L720 Epidermal cyst: Secondary | ICD-10-CM | POA: Diagnosis not present

## 2019-06-22 DIAGNOSIS — L821 Other seborrheic keratosis: Secondary | ICD-10-CM | POA: Diagnosis not present

## 2019-06-22 DIAGNOSIS — C44722 Squamous cell carcinoma of skin of right lower limb, including hip: Secondary | ICD-10-CM | POA: Diagnosis not present

## 2019-06-22 DIAGNOSIS — L570 Actinic keratosis: Secondary | ICD-10-CM | POA: Diagnosis not present

## 2019-06-22 DIAGNOSIS — Z08 Encounter for follow-up examination after completed treatment for malignant neoplasm: Secondary | ICD-10-CM | POA: Diagnosis not present

## 2019-06-22 DIAGNOSIS — X32XXXA Exposure to sunlight, initial encounter: Secondary | ICD-10-CM | POA: Diagnosis not present

## 2019-06-22 DIAGNOSIS — D485 Neoplasm of uncertain behavior of skin: Secondary | ICD-10-CM | POA: Diagnosis not present

## 2019-06-22 DIAGNOSIS — Z85828 Personal history of other malignant neoplasm of skin: Secondary | ICD-10-CM | POA: Diagnosis not present

## 2019-06-22 DIAGNOSIS — L718 Other rosacea: Secondary | ICD-10-CM | POA: Diagnosis not present

## 2019-06-27 DIAGNOSIS — H353211 Exudative age-related macular degeneration, right eye, with active choroidal neovascularization: Secondary | ICD-10-CM | POA: Diagnosis not present

## 2019-06-29 DIAGNOSIS — G894 Chronic pain syndrome: Secondary | ICD-10-CM | POA: Diagnosis not present

## 2019-06-29 DIAGNOSIS — M1712 Unilateral primary osteoarthritis, left knee: Secondary | ICD-10-CM | POA: Diagnosis not present

## 2019-06-29 DIAGNOSIS — Z96651 Presence of right artificial knee joint: Secondary | ICD-10-CM | POA: Diagnosis not present

## 2019-06-29 DIAGNOSIS — M25562 Pain in left knee: Secondary | ICD-10-CM | POA: Diagnosis not present

## 2019-06-29 DIAGNOSIS — M25512 Pain in left shoulder: Secondary | ICD-10-CM | POA: Diagnosis not present

## 2019-06-29 DIAGNOSIS — M7062 Trochanteric bursitis, left hip: Secondary | ICD-10-CM | POA: Diagnosis not present

## 2019-06-29 DIAGNOSIS — M25511 Pain in right shoulder: Secondary | ICD-10-CM | POA: Diagnosis not present

## 2019-07-03 DIAGNOSIS — R1032 Left lower quadrant pain: Secondary | ICD-10-CM | POA: Diagnosis not present

## 2019-07-03 DIAGNOSIS — N8189 Other female genital prolapse: Secondary | ICD-10-CM | POA: Diagnosis not present

## 2019-07-03 DIAGNOSIS — R1031 Right lower quadrant pain: Secondary | ICD-10-CM | POA: Diagnosis not present

## 2019-07-03 DIAGNOSIS — K5900 Constipation, unspecified: Secondary | ICD-10-CM | POA: Diagnosis not present

## 2019-07-03 DIAGNOSIS — K52832 Lymphocytic colitis: Secondary | ICD-10-CM | POA: Diagnosis not present

## 2019-07-12 DIAGNOSIS — M810 Age-related osteoporosis without current pathological fracture: Secondary | ICD-10-CM | POA: Diagnosis not present

## 2019-07-21 DIAGNOSIS — C44722 Squamous cell carcinoma of skin of right lower limb, including hip: Secondary | ICD-10-CM | POA: Diagnosis not present

## 2019-07-24 ENCOUNTER — Other Ambulatory Visit: Payer: Self-pay

## 2019-07-24 ENCOUNTER — Ambulatory Visit: Payer: PPO | Attending: Internal Medicine | Admitting: Physical Therapy

## 2019-07-24 DIAGNOSIS — R2689 Other abnormalities of gait and mobility: Secondary | ICD-10-CM | POA: Insufficient documentation

## 2019-07-24 DIAGNOSIS — Z9181 History of falling: Secondary | ICD-10-CM | POA: Insufficient documentation

## 2019-07-24 DIAGNOSIS — M25562 Pain in left knee: Secondary | ICD-10-CM | POA: Insufficient documentation

## 2019-07-24 DIAGNOSIS — M797 Fibromyalgia: Secondary | ICD-10-CM | POA: Diagnosis not present

## 2019-07-24 DIAGNOSIS — F3341 Major depressive disorder, recurrent, in partial remission: Secondary | ICD-10-CM | POA: Diagnosis not present

## 2019-07-24 DIAGNOSIS — L299 Pruritus, unspecified: Secondary | ICD-10-CM | POA: Diagnosis not present

## 2019-07-24 DIAGNOSIS — G8929 Other chronic pain: Secondary | ICD-10-CM | POA: Diagnosis not present

## 2019-07-24 DIAGNOSIS — R29898 Other symptoms and signs involving the musculoskeletal system: Secondary | ICD-10-CM | POA: Diagnosis not present

## 2019-07-24 DIAGNOSIS — I10 Essential (primary) hypertension: Secondary | ICD-10-CM | POA: Diagnosis not present

## 2019-07-24 DIAGNOSIS — G43119 Migraine with aura, intractable, without status migrainosus: Secondary | ICD-10-CM | POA: Diagnosis not present

## 2019-07-24 DIAGNOSIS — M25552 Pain in left hip: Secondary | ICD-10-CM | POA: Diagnosis not present

## 2019-07-24 DIAGNOSIS — Z0000 Encounter for general adult medical examination without abnormal findings: Secondary | ICD-10-CM | POA: Diagnosis not present

## 2019-07-24 DIAGNOSIS — R519 Headache, unspecified: Secondary | ICD-10-CM | POA: Diagnosis not present

## 2019-07-24 NOTE — Therapy (Cosign Needed)
[place] MAIN [facility] SERVICES [address] [place], [place], [address] Phone: [phone]   Fax:  [phone]  Physical Therapy Evaluation  Patient Details  Name: Lindsey [name] MRN: [id number] Date of Birth: 1936-07-03 Referring Provider (PT): Hande   Encounter Date: 07/24/2019  PT End of Session - 07/24/19 1719    Visit Number  1    Number of Visits  10    Date for PT Re-Evaluation  10/02/19    Authorization - Visit Number  1    PT Start Time  1600    PT Stop Time  1700    PT Time Calculation (min)  60 min    Activity Tolerance  Patient tolerated treatment well    Behavior During Therapy  [facility] for tasks assessed/performed       Past Medical History:  Diagnosis Date  . Anemia   . Asthma   . Breast cancer ([place])   . Bronchiectasis ([place]) 06/2007  . Cancer ([place])   . Colitis   . COPD (chronic obstructive pulmonary disease) ([place])   . Depression   . DVT (deep venous thrombosis) ([place])    after her right knee surgery.   . Fibromyalgia   . Fibromyalgia   . Hypertension   . Migraine headache with aura   . Osteoarthritis   . Pneumonia   . Polio 1952    Past Surgical History:  Procedure Laterality Date  . ABDOMINAL HYSTERECTOMY    . BACK SURGERY    . BILATERAL TOTAL MASTECTOMY WITH AXILLARY LYMPH NODE DISSECTION    . CARPAL TUNNEL RELEASE     bilateral   . COLONOSCOPY WITH PROPOFOL N/A 08/02/2015   Procedure: COLONOSCOPY WITH PROPOFOL;  Surgeon: Manya Silvas, MD;  Location: [facility] ENDOSCOPY;  Service: Endoscopy;  Laterality: N/A;  . FINGER TENDON REPAIR    . HERNIA REPAIR    . REPLACEMENT TOTAL KNEE     right knee   . TONSILLECTOMY    . UMBILICAL HERNIA REPAIR    . VAGINAL HYSTERECTOMY      There were no vitals filed for this visit.   Subjective Assessment - 07/24/19 1610    Subjective  Since July, patient says she has been officially diagnoses with osteoperosis, and she has delayed treatment due to medication side  effects would like to take (allgecal). Patient has been having R sided headaches recently and the physician ordered a blood test today. Bowel and bladder function has been great since ending therapy in July. Has had a great bowel movement every single day for the last 3 weeks. Patient says she doesn't feel like she needs to strain at all, except a small strain this morning. Patient states she feels like she is having complete emptying of her bowels seach time. Patient would like to be able to improve her balance and strengthening in LE and gait improvement.    Limitations  Walking;Standing    How long can you walk comfortably?  No AD. 300 feet- length of hallway; very difficult to grocery shop.    Patient Stated Goals  Improve gait; improve balance; reduce risk of falls. (last fall was in end of April while reaching out of base of support and tripped).    Currently in Pain?  Yes    Pain Score  --   1 (rest) to 8 (while walking)   Pain Location  Hip   L hip and knee   Pain Orientation  Left  Pain Descriptors / Indicators  Discomfort;Aching    Pain Type  Chronic pain    Pain Radiating Towards  hip to knee    Pain Onset  Other (comment)   chronic   Pain Frequency  Other (Comment)   frequent   Aggravating Factors   walkingl stairsl grocery shopping    Pain Relieving Factors  Alleeve (only when the pain gets really bad); ice the hip and knee    Effect of Pain on Daily Activities  no stairs; limiting outside activities;         [facility] PT Assessment - 07/25/19 0814      Assessment   Medical Diagnosis  balance    Referring Provider (PT)  Hande      Precautions   Precautions  None      Restrictions   Weight Bearing Restrictions  No      Balance Screen   Has the patient fallen in the past 6 months  No      Observation/Other Assessments   Observations  upright posture       Sit to Stand   Comments  20.42 seconds    had to slow down at 4 reps due to hip pain; valgus collapse      Posture/Postural Control   Posture/Postural Control  Postural limitations    Postural Limitations  Rounded Shoulders    Posture Comments  valgus collapse with sit<>stand       Palpation   Palpation comment  tenderness at L pes anserne bursae; tenderness at L greater trochanter       Ambulation/Gait   Gait Comments  0.97m/s: decreased truncal rotation to the L.       Berg Balance Test   Sit to Stand  Able to stand without using hands and stabilize independently    Standing Unsupported  Able to stand safely 2 minutes    Sitting with Back Unsupported but Feet Supported on Floor or Stool  Able to sit safely and securely 2 minutes    Stand to Sit  Sits safely with minimal use of hands    Transfers  Able to transfer safely, minor use of hands    Standing Unsupported with Eyes Closed  Able to stand 10 seconds with supervision    Standing Unsupported with Feet Together  Able to place feet together independently and stand for 1 minute with supervision    From Standing, Reach Forward with Outstretched Arm  Can reach forward >5 cm safely (2")   11 inch reaches    From Standing Position, Pick up Object from Floor  Unable to pick up shoe, but reaches 2-5 cm (1-2") from shoe and balances independently    From Standing Position, Turn to Look Behind Over each Shoulder  Looks behind one side only/other side shows less weight shift    Turn 360 Degrees  Able to turn 360 degrees safely in 4 seconds or less    Standing Unsupported, Alternately Place Feet on Step/Stool  Able to stand independently and complete 8 steps >20 seconds    Standing Unsupported, One Foot in Front  Able to take small step independently and hold 30 seconds    Standing on One Leg  Able to lift leg independently and hold equal to or more than 3 seconds    Total Score  44                Objective measurements completed on examination: See above findings.  [facility] Adult PT Treatment/Exercise - 07/25/19 0815       Ambulation/Gait   Gait Comments  0.58m/s: decreased truncal rotation to the L.       Posture/Postural Control   Posture/Postural Control  Postural limitations    Postural Limitations  Rounded Shoulders    Posture Comments  valgus collapse with sit<>stand       Therapeutic Activites    Other Therapeutic Activities  L quad set with heel elevated 10 second holds- reducing quad lag activation within pain free motion; L supine figure 4 stretch for decrease pain at L GT                PT Short Term Goals - 07/24/19 1723      PT SHORT TERM GOAL #1   Title  Pt will demonstrate a >/= 53 on BERG balance in order to represent decreased fall risk and improved static balance.    Baseline  44/56    Time  5    Period  Weeks    Status  New    Target Date  08/28/19      PT SHORT TERM GOAL #2   Title  Patient will demonstrate improved gait speed to 1.8 m/s demonstrating appropriate community ambulation speed.    Baseline  0.65m/s    Time  5    Period  Weeks    Status  New    Target Date  08/28/19        PT Long Term Goals - 07/24/19 1726      PT LONG TERM GOAL #1   Title  Pt will demonstrate 5x sit<>stand in less than 15 seconds, demonstrating reduction of fall risk.    Baseline  initial eval- 20 seconds 5xSTS    Time  10    Period  Weeks    Status  New    Target Date  10/02/19      PT LONG TERM GOAL #2   Title  Patient will report pain no higher than 4/10 on NPS at L hip and knee over the course of 1 month.    Baseline  8/10 on NPS greater than 30 minutes of walking.    Time  10    Period  Weeks    Status  New    Target Date  10/02/19      PT LONG TERM GOAL #3   Title  Pt will demo stepping strategies in four directions 10/10 trials without ankle strategies/ LOB during manually applied pertubation at waist in order to minimize falls    Time  5    Period  Weeks    Status  New    Target Date  08/28/19             Plan - 07/24/19 1720    Clinical Impression  Statement  Pt is an 83 y.o female with a history of osteopenia, history of CA, anterolisthesis and fibromyalgia. Pt was previous seen this year for pelvic floor PT. Patient has resolution and/or self management of pelvic symptoms, however is having L hip and knee pain that is limiting QOL. Patient has a fear of falling and would like to prevent falls, become confident walking again and reduce pain.   Patient demonstrates decrease gait speed- 0.3m/s indicating a higher risk of fall. Patient will benefit from gait training to address gait speed to decrease risk of fall and gain further independence if patient can achieve community ambulation of 1.6 m/s. Patient is limited  by her pain most while walking, and describes 8/10 on NPS after performing grocery shopping.   Patient also demonstrates a time of >20 seconds during 5x sit<>stand which puts the patient at a greater risk of a fall (low fall risk is a score of <15 seconds). Additionally, patient scored 44/56 on the BERG balance test, also indicating a higher fall risk.   HEP given to patient at initial evaluation was- L quad sets for improve quad activation and reduction of quad lag (possibly contributing to decreased L LE mobility during session), and figure 4 stretch to decrease hip pain (at greater trochanter). Patient verbalized and demonstrated understanding.   Patient is highly motivated. Patient would benefit from skilled PT to address pain limiting function, and progress to dynamic functional balance improvements in order to increase patients QOL, decrease fall risk and improve patient's community ambulation opportunities.     Personal Factors and Comorbidities  Comorbidity 3+    Comorbidities  ostepenia; history of CA; anterolisthesis; fibromyalgia    Stability/Clinical Decision Making  Evolving/Moderate complexity    Clinical Decision Making  Moderate    Rehab Potential  Fair    Clinical Impairments Affecting Rehab Potential   moderate  scoliosis, post-polio decreased smooth muscle activity, lumbar stenosis, bulging disk, anterolisthesis, osteopenia, OA, hysterectomy, double mastectomy, breast cancer, fibromyalgia, and Bronchiectasis    PT Frequency  1x / week    PT Duration  Other (comment)   10 weeks   PT Treatment/Interventions  ADLs/Self Care Home Management;Aquatic Therapy;Biofeedback;Moist Heat;Electrical Stimulation;Gait training;Therapeutic exercise;Therapeutic activities;Functional mobility training;Neuromuscular re-education;Patient/family education;Manual techniques;Scar mobilization;Dry needling;Passive range of motion;Taping;Joint Manipulations    PT Next Visit Plan  integration of standing/dynamic balance (standing glute med strengthening); manual work- to decreased pain at L GT and pes anserene    PT Home Exercise Plan  integrating dynamic balance for functional movment; increasing gait speed and gait endurance below pain threshold; progressing below pain threshold.    Consulted and Agree with Plan of Care  Patient       Patient will benefit from skilled therapeutic intervention in order to improve the following deficits and impairments:  Decreased balance, Decreased endurance, Increased muscle spasms, Cardiopulmonary status limiting activity, Decreased range of motion, Impaired tone, Improper body mechanics, Decreased activity tolerance, Decreased strength, Impaired flexibility, Postural dysfunction, Pain, Hypomobility  Visit Diagnosis: Other abnormalities of gait and mobility  Left knee pain, unspecified chronicity  Pain in left hip  At risk for falls     Problem List Patient Active Problem List   Diagnosis Date Noted  . SOB (shortness of breath) 04/30/2014  . Leg swelling 04/30/2014  . Bronchiectasis without complication ([place]) [id number]  . History of DVT (deep vein thrombosis) 04/30/2014  . Labile blood pressure 04/30/2014  . S/P IVC filter 04/30/2014  . History of right knee surgery 04/30/2014     Tomasita Morrow, SPT  07/25/2019, 8:15 AM    Jerl Mina, PT, DPT, E-RYT    [place] MAIN [facility] SERVICES [address] [place], [place], [address] Phone: [phone]   Fax:  [phone]  Name: Lindsey [name] MRN: [id number] Date of Birth: [birth year]

## 2019-07-24 NOTE — Patient Instructions (Signed)
Figure 4 Stretch- laying down  Lay down and cross left ankle over right knee. And let left knee fall out to the side, till you feel a gentle stretch.  -- Repeat on the right side.  -- 3 sets on the left- hold for 5 gentle breaths  -- 2 sets on the right- hold for 5 gentle breaths.      Push Down; Knee Straighten  Lay down, bend your right knee. Straight your left knee and place a small towel right above your left heel.  Straight your left knee for 10 seconds, repeat 3x.

## 2019-07-31 ENCOUNTER — Ambulatory Visit: Payer: PPO | Admitting: Physical Therapy

## 2019-07-31 ENCOUNTER — Other Ambulatory Visit: Payer: Self-pay

## 2019-07-31 DIAGNOSIS — M25552 Pain in left hip: Secondary | ICD-10-CM

## 2019-07-31 DIAGNOSIS — M25562 Pain in left knee: Secondary | ICD-10-CM

## 2019-07-31 DIAGNOSIS — Z9181 History of falling: Secondary | ICD-10-CM

## 2019-07-31 DIAGNOSIS — R2689 Other abnormalities of gait and mobility: Secondary | ICD-10-CM | POA: Diagnosis not present

## 2019-07-31 NOTE — Patient Instructions (Signed)
Elbow and neck press on your back 5 breaths , 10 reps    Angel wings slides on your back  10 reps

## 2019-07-31 NOTE — Therapy (Addendum)
[place] MAIN [facility] SERVICES [address] [place], [place], [address] Phone: [phone]   Fax:  [phone]  Physical Therapy Treatment  Patient Details  Name: Lindsey [name] MRN: [id number] Date of Birth: 20-Sep-1936 Referring Provider (PT): Hande   Encounter Date: 07/31/2019  PT End of Session - 07/31/19 1407    Visit Number  2    Number of Visits  10    Date for PT Re-Evaluation  10/02/19    Authorization - Visit Number  2    PT Start Time  [id number]    PT Stop Time  1407    PT Time Calculation (min)  60 min    Activity Tolerance  Patient tolerated treatment well    Behavior During Therapy  [facility] for tasks assessed/performed       Past Medical History:  Diagnosis Date  . Anemia   . Asthma   . Breast cancer ([place])   . Bronchiectasis ([place]) 06/2007  . Cancer ([place])   . Colitis   . COPD (chronic obstructive pulmonary disease) ([place])   . Depression   . DVT (deep venous thrombosis) ([place])    after her right knee surgery.   . Fibromyalgia   . Fibromyalgia   . Hypertension   . Migraine headache with aura   . Osteoarthritis   . Pneumonia   . Polio 1952    Past Surgical History:  Procedure Laterality Date  . ABDOMINAL HYSTERECTOMY    . BACK SURGERY    . BILATERAL TOTAL MASTECTOMY WITH AXILLARY LYMPH NODE DISSECTION    . CARPAL TUNNEL RELEASE     bilateral   . COLONOSCOPY WITH PROPOFOL N/A 08/02/2015   Procedure: COLONOSCOPY WITH PROPOFOL;  Surgeon: Manya Silvas, MD;  Location: [facility] ENDOSCOPY;  Service: Endoscopy;  Laterality: N/A;  . FINGER TENDON REPAIR    . HERNIA REPAIR    . REPLACEMENT TOTAL KNEE     right knee   . TONSILLECTOMY    . UMBILICAL HERNIA REPAIR    . VAGINAL HYSTERECTOMY      There were no vitals filed for this visit.  Subjective Assessment - 07/31/19 1312    Subjective  Pt can tell that the exercises she got last week are helping. Walking is easier and her hip is not hurting as much.    Limitations   Walking;Standing    How long can you walk comfortably?  No AD. 300 feet- length of hallway; very difficult to grocery shop.    Patient Stated Goals  Improve gait; improve balance; reduce risk of falls. (last fall was in end of April while reaching out of base of support and tripped).    Pain Onset  Other (comment)   chronic        [facility] PT Assessment - 07/31/19 1325      Observation/Other Assessments   Observations  cervical extension endurance test 1 : 39       Palpation   Spinal mobility  T4 hypomobile      Palpation comment  STM/MWM at L levator scapula, rhomoboids L, posterior ribs  mm tightness                    [facility] Adult PT Treatment/Exercise - 07/31/19 1410      Exercises   Exercises  --   see pt instructions, scap dep/ cervical retraction     Manual Therapy   Manual therapy comments  STM/MWM at L levator scapula, rhomoboids  L, posterior ribs , Grade III medial glide T 4                PT Short Term Goals - 07/24/19 1723      PT SHORT TERM GOAL #1   Title  Pt will demonstrate a >/= 53 on BERG balance in order to represent decreased fall risk and improved static balance.    Baseline  44/56    Time  5    Period  Weeks    Status  New    Target Date  08/28/19      PT SHORT TERM GOAL #2   Title  Patient will demonstrate improved gait speed to 1.8 m/s demonstrating appropriate community ambulation speed.    Baseline  0.110m/s    Time  5    Period  Weeks    Status  New    Target Date  08/28/19        PT Long Term Goals - 07/31/19 1317      PT LONG TERM GOAL #1   Title  Pt will demonstrate 5x sit<>stand in less than 15 seconds, demonstrating reduction of fall risk.    Baseline  initial eval- 20 seconds 5xSTS    Time  10    Period  Weeks    Status  On-going      PT LONG TERM GOAL #2   Title  Patient will report pain no higher than 4/10 on NPS at L hip and knee over the course of 1 month.    Baseline  8/10 on NPS greater than 30 minutes  of walking.    Time  10    Period  Weeks    Status  On-going      PT LONG TERM GOAL #3   Title  Pt will demo stepping strategies in four directions 10/10 trials without ankle strategies/ LOB during manually applied pertubation at waist in order to minimize falls    Time  5    Period  Weeks    Status  On-going      PT LONG TERM GOAL #4   Title  Pt will demo less forward head ( earlobe on R to acromium 16 cm, L 17cm to R 16.5 cm and 17.5 cm without cuing for cervical retraction) in order to minimize throacic kyphosis and ower risk for spinal Fx with osteopenia Dx    Time  10    Period  Weeks    Status  New    Target Date  10/09/19      PT LONG TERM GOAL #5   Title  Pt will increase her cervical extension endurance test 1 : 39 to >2 min before fatigue in order to improve balance and stability for less falls risk    Time  6    Period  Weeks    Status  New    Target Date  09/11/19            Plan - 07/31/19 1407    Clinical Impression Statement  Pt showed improved cervical /scapular retraction post Tx which she tolerated without complaints. Pt showed more restrictions over L latero-posterior thoracic region where she had a mastectomy. Pt demo'd increased thoracic mobility post Tx which makes her read for thoracolumbar strengthening. Neck propioception and cervical extensor endurance will both improve with upcoming sessions and  pt will demo less forward head posture which will help with balance. Pt continues to benefit from skilled PT.    Personal  Factors and Comorbidities  Comorbidity 3+    Comorbidities  ostepenia; history of CA; anterolisthesis; fibromyalgia    Stability/Clinical Decision Making  Evolving/Moderate complexity    Rehab Potential  Fair    Clinical Impairments Affecting Rehab Potential   moderate scoliosis, post-polio decreased smooth muscle activity, lumbar stenosis, bulging disk, anterolisthesis, osteopenia, OA, hysterectomy, double mastectomy, breast cancer,  fibromyalgia, and Bronchiectasis    PT Frequency  1x / week    PT Duration  Other (comment)   10 weeks   PT Treatment/Interventions  ADLs/Self Care Home Management;Aquatic Therapy;Biofeedback;Moist Heat;Electrical Stimulation;Gait training;Therapeutic exercise;Therapeutic activities;Functional mobility training;Neuromuscular re-education;Patient/family education;Manual techniques;Scar mobilization;Dry needling;Passive range of motion;Taping;Joint Manipulations        Consulted and Agree with Plan of Care  Patient       Patient will benefit from skilled therapeutic intervention in order to improve the following deficits and impairments:  Decreased balance, Decreased endurance, Increased muscle spasms, Cardiopulmonary status limiting activity, Decreased range of motion, Impaired tone, Improper body mechanics, Decreased activity tolerance, Decreased strength, Impaired flexibility, Postural dysfunction, Pain, Hypomobility  Visit Diagnosis: Left knee pain, unspecified chronicity  Other abnormalities of gait and mobility  Pain in left hip  At risk for falls     Problem List Patient Active Problem List   Diagnosis Date Noted  . SOB (shortness of breath) 04/30/2014  . Leg swelling 04/30/2014  . Bronchiectasis without complication ([place]) [id number]  . History of DVT (deep vein thrombosis) 04/30/2014  . Labile blood pressure 04/30/2014  . S/P IVC filter 04/30/2014  . History of right knee surgery 04/30/2014    Jerl Mina ,PT, DPT, E-RYT   07/31/2019, 2:16 PM  [place] MAIN [facility] SERVICES [address] [place], [place], [address] Phone: [phone]   Fax:  [phone]  Name: Lindsey [name] MRN: [id number] Date of Birth: 12-19-1935

## 2019-08-02 ENCOUNTER — Ambulatory Visit: Payer: PPO | Admitting: Physical Therapy

## 2019-08-07 ENCOUNTER — Ambulatory Visit: Payer: PPO | Attending: Internal Medicine | Admitting: Physical Therapy

## 2019-08-07 ENCOUNTER — Other Ambulatory Visit: Payer: Self-pay

## 2019-08-07 DIAGNOSIS — M25562 Pain in left knee: Secondary | ICD-10-CM | POA: Diagnosis not present

## 2019-08-07 DIAGNOSIS — Z9181 History of falling: Secondary | ICD-10-CM | POA: Insufficient documentation

## 2019-08-07 DIAGNOSIS — M25552 Pain in left hip: Secondary | ICD-10-CM | POA: Insufficient documentation

## 2019-08-07 DIAGNOSIS — R2689 Other abnormalities of gait and mobility: Secondary | ICD-10-CM | POA: Insufficient documentation

## 2019-08-07 DIAGNOSIS — R293 Abnormal posture: Secondary | ICD-10-CM | POA: Diagnosis not present

## 2019-08-07 NOTE — Therapy (Addendum)
[place] MAIN [facility] SERVICES [address] [place], [place], [address] Phone: [phone]   Fax:  [phone]  Physical Therapy Treatment  Patient Details  Name: Lindsey [name] MRN: [id number] Date of Birth: [birth year] Referring Provider (PT): Hande   Encounter Date: 08/07/2019  PT End of Session - 08/07/19 1316    Visit Number  3    Number of Visits  10    Date for PT Re-Evaluation  10/02/19    Authorization - Visit Number  2    PT Start Time  [id number]    PT Stop Time  1409    PT Time Calculation (min)  57 min    Activity Tolerance  Patient tolerated treatment well    Behavior During Therapy  [facility] for tasks assessed/performed       Past Medical History:  Diagnosis Date  . Anemia   . Asthma   . Breast cancer ([place])   . Bronchiectasis ([place]) 06/2007  . Cancer ([place])   . Colitis   . COPD (chronic obstructive pulmonary disease) ([place])   . Depression   . DVT (deep venous thrombosis) ([place])    after her right knee surgery.   . Fibromyalgia   . Fibromyalgia   . Hypertension   . Migraine headache with aura   . Osteoarthritis   . Pneumonia   . Polio 1952    Past Surgical History:  Procedure Laterality Date  . ABDOMINAL HYSTERECTOMY    . BACK SURGERY    . BILATERAL TOTAL MASTECTOMY WITH AXILLARY LYMPH NODE DISSECTION    . CARPAL TUNNEL RELEASE     bilateral   . COLONOSCOPY WITH PROPOFOL N/A 08/02/2015   Procedure: COLONOSCOPY WITH PROPOFOL;  Surgeon: Manya Silvas, MD;  Location: [facility] ENDOSCOPY;  Service: Endoscopy;  Laterality: N/A;  . FINGER TENDON REPAIR    . HERNIA REPAIR    . REPLACEMENT TOTAL KNEE     right knee   . TONSILLECTOMY    . UMBILICAL HERNIA REPAIR    . VAGINAL HYSTERECTOMY      There were no vitals filed for this visit.  Subjective Assessment - 08/07/19 1315    Subjective  The exercises from last week were fine. The headache is better. Her L knee is worst today after sitting iner dentist chair. L knee pain  at 7/10    Limitations  Walking;Standing    How long can you walk comfortably?  No AD. 300 feet- length of hallway; very difficult to grocery shop.    Patient Stated Goals  Improve gait; improve balance; reduce risk of falls. (last fall was in end of April while reaching out of base of support and tripped).    Pain Onset  Other (comment)   chronic        [facility] PT Assessment - 08/07/19 1320      Observation/Other Assessments   Observations  ankles crossed       Squat   Comments  anterior knee placement       Sit to Stand   Comments  genu valgus       Strength   Overall Strength Comments  hip L abd seated 3+/5, R 4/5, hip ext in prone B 4-/5       Palpation   Spinal mobility  T4 R hypomobile, deviated R slightly     Palpation comment  semispinalis , scalene posterior R tight  [place] Adult PT Treatment/Exercise - 08/07/19 1402      Neuro Re-ed    Neuro Re-ed Details   excessive cues for not hypextending knees in standing, seated with weightbearing ofischial tuberosities, cervical retraction in HEP        Manual Therapy   Manual therapy comments  Medial glide at T4, STM/MWM at scalenes                PT Short Term Goals - 07/24/19 1723      PT SHORT TERM GOAL #1   Title  Pt will demonstrate a >/= 53 on BERG balance in order to represent decreased fall risk and improved static balance.    Baseline  44/56    Time  5    Period  Weeks    Status  New    Target Date  08/28/19      PT SHORT TERM GOAL #2   Title  Patient will demonstrate improved gait speed to 1.8 m/s demonstrating appropriate community ambulation speed.    Baseline  0.32m/s    Time  5    Period  Weeks    Status  New    Target Date  08/28/19        PT Long Term Goals - 07/31/19 1317      PT LONG TERM GOAL #1   Title  Pt will demonstrate 5x sit<>stand in less than 15 seconds, demonstrating reduction of fall risk.    Baseline  initial eval- 20 seconds 5xSTS     Time  10    Period  Weeks    Status  On-going      PT LONG TERM GOAL #2   Title  Patient will report pain no higher than 4/10 on NPS at L hip and knee over the course of 1 month.    Baseline  8/10 on NPS greater than 30 minutes of walking.    Time  10    Period  Weeks    Status  On-going      PT LONG TERM GOAL #3   Title  Pt will demo stepping strategies in four directions 10/10 trials without ankle strategies/ LOB during manually applied pertubation at waist in order to minimize falls    Time  5    Period  Weeks    Status  On-going      PT LONG TERM GOAL #4   Title  Pt will demo less forward head ( earlobe on R to acromium 16 cm, L 17cm to R 16.5 cm and 17.5 cm without cuing for cervical retraction) in order to minimize throacic kyphosis and ower risk for spinal Fx with osteopenia Dx    Time  10    Period  Weeks    Status  New    Target Date  10/09/19      PT LONG TERM GOAL #5   Title  Pt will increase her cervical extension endurance test 1 : 39 to >2 min before fatigue in order to improve balance and stability for less falls risk    Time  6    Period  Weeks    Status  New    Target Date  09/11/19            Plan - 08/07/19 1317    Clinical Impression Statement Pt arrived late to session. Pt 's L knee pain decreased from 7/10 to 0/10 following Tx today. Hip extension and abduction strength is weak and thus new  HEP was added to strengthen and neuromuscular re-education for proper knee alignment to minimize genu valgus. Pt was educated about the importance weight bearing of feet in sitting posture and not hyperextending L knee in standing.   Pt is making good improvements with less forward head posture. Pt showed good carry over with decreased neck mm tightness from last week. Applied further manual Tx to medially realign T4 and decrease mm tightness of semispinals, scalenes on R.   Deferred pt's questions on supplements to her medical providers as this topic is outside PT  scope of practice.   Pt continues to benefit skilled PT.     Personal Factors and Comorbidities  Comorbidity 3+    Comorbidities  ostepenia; history of CA; anterolisthesis; fibromyalgia    Stability/Clinical Decision Making  Evolving/Moderate complexity    Rehab Potential  Fair    Clinical Impairments Affecting Rehab Potential  moderate scoliosis, post-polio decreased smooth muscle activity, lumbar stenosis, bulging disk, anterolisthesis, osteopenia, OA, hysterectomy, double mastectomy, breast cancer, fibromyalgia, and Bronchiectasis    PT Frequency  1x / week    PT Duration  Other (comment)   10 weeks   PT Treatment/Interventions  ADLs/Self Care Home Management;Aquatic Therapy;Biofeedback;Moist Heat;Electrical Stimulation;Gait training;Therapeutic exercise;Therapeutic activities;Functional mobility training;Neuromuscular re-education;Patient/family education;Manual techniques;Scar mobilization;Dry needling;Passive range of motion;Taping;Joint Manipulations    Consulted and Agree with Plan of Care  Patient       Patient will benefit from skilled therapeutic intervention in order to improve the following deficits and impairments:  Decreased balance, Decreased endurance, Increased muscle spasms, Cardiopulmonary status limiting activity, Decreased range of motion, Impaired tone, Improper body mechanics, Decreased activity tolerance, Decreased strength, Impaired flexibility, Postural dysfunction, Pain, Hypomobility  Visit Diagnosis: Other abnormalities of gait and mobility  Left knee pain, unspecified chronicity  Pain in left hip  At risk for falls  Abnormal posture     Problem List Patient Active Problem List   Diagnosis Date Noted  . SOB (shortness of breath) 04/30/2014  . Leg swelling 04/30/2014  . Bronchiectasis without complication ([place]) [id number]  . History of DVT (deep vein thrombosis) 04/30/2014  . Labile blood pressure 04/30/2014  . S/P IVC filter 04/30/2014  . History  of right knee surgery 04/30/2014    Jerl Mina ,PT, DPT, E-RYT  08/07/2019, 2:27 PM  [place] MAIN [facility] SERVICES [address] [place], [place], [address] Phone: [phone]   Fax:  [phone]  Name: Lindsey [name] MRN: [id number] Date of Birth: 24-May-1936

## 2019-08-07 NOTE — Patient Instructions (Addendum)
Seated red band hip abduction Four points of contact plus hands making it 6points  chin tuck, shoulders down   Pelvic neutral , not  tucked under   Exhale, move L knee a little degree out without lifting. 20 x 3 x day  ___  Red band at thigh Minisquat: Scoot buttocks back slight, hinge like you are looking at your reflection on a pond  Knees behind toes,  Inhale to "smell flowers"  Exhale on the rise "like rocket"  Do not lock knees, have more weight across ballmounds of feet, toes relaxed   10 reps x 3 x day   __  Standing with knees slightly bent, more weight across ballmounds   Sitting with feet pressed into the ground

## 2019-08-10 ENCOUNTER — Ambulatory Visit: Payer: PPO | Admitting: Physical Therapy

## 2019-08-14 ENCOUNTER — Other Ambulatory Visit: Payer: Self-pay

## 2019-08-14 ENCOUNTER — Ambulatory Visit: Payer: PPO | Admitting: Physical Therapy

## 2019-08-14 DIAGNOSIS — R1032 Left lower quadrant pain: Secondary | ICD-10-CM | POA: Diagnosis not present

## 2019-08-14 DIAGNOSIS — R1031 Right lower quadrant pain: Secondary | ICD-10-CM | POA: Diagnosis not present

## 2019-08-14 DIAGNOSIS — K5900 Constipation, unspecified: Secondary | ICD-10-CM | POA: Diagnosis not present

## 2019-08-14 DIAGNOSIS — M25552 Pain in left hip: Secondary | ICD-10-CM

## 2019-08-14 DIAGNOSIS — R2689 Other abnormalities of gait and mobility: Secondary | ICD-10-CM

## 2019-08-14 DIAGNOSIS — N8189 Other female genital prolapse: Secondary | ICD-10-CM | POA: Diagnosis not present

## 2019-08-14 DIAGNOSIS — K52832 Lymphocytic colitis: Secondary | ICD-10-CM | POA: Diagnosis not present

## 2019-08-14 DIAGNOSIS — M25562 Pain in left knee: Secondary | ICD-10-CM

## 2019-08-14 NOTE — Patient Instructions (Addendum)
Use the magneta band   Head and shoulder presses   Hand press into chair, shoulders down, chest rotates and small rotation of neck

## 2019-08-14 NOTE — Therapy (Addendum)
[place] MAIN [facility] SERVICES [address] [place], [place], [address] Phone: [phone]   Fax:  [phone]  Physical Therapy Treatment  Patient Details  Name: Lindsey [name] MRN: [id number] Date of Birth: 1936/01/27 Referring Provider (PT): Hande   Encounter Date: 08/14/2019  PT End of Session - 08/14/19 1315    Visit Number  4    Number of Visits  10    Date for PT Re-Evaluation  10/02/19    PT Start Time  1311    PT Stop Time  1400    PT Time Calculation (min)  49 min    Activity Tolerance  Patient tolerated treatment well    Behavior During Therapy  [facility] for tasks assessed/performed       Past Medical History:  Diagnosis Date  . Anemia   . Asthma   . Breast cancer ([place])   . Bronchiectasis ([place]) 06/2007  . Cancer ([place])   . Colitis   . COPD (chronic obstructive pulmonary disease) ([place])   . Depression   . DVT (deep venous thrombosis) ([place])    after her right knee surgery.   . Fibromyalgia   . Fibromyalgia   . Hypertension   . Migraine headache with aura   . Osteoarthritis   . Pneumonia   . Polio 1952    Past Surgical History:  Procedure Laterality Date  . ABDOMINAL HYSTERECTOMY    . BACK SURGERY    . BILATERAL TOTAL MASTECTOMY WITH AXILLARY LYMPH NODE DISSECTION    . CARPAL TUNNEL RELEASE     bilateral   . COLONOSCOPY WITH PROPOFOL N/A 08/02/2015   Procedure: COLONOSCOPY WITH PROPOFOL;  Surgeon: Manya Silvas, MD;  Location: [facility] ENDOSCOPY;  Service: Endoscopy;  Laterality: N/A;  . FINGER TENDON REPAIR    . HERNIA REPAIR    . REPLACEMENT TOTAL KNEE     right knee   . TONSILLECTOMY    . UMBILICAL HERNIA REPAIR    . VAGINAL HYSTERECTOMY      There were no vitals filed for this visit.  Subjective Assessment - 08/14/19 1314    Subjective  Pt saw her GI and also reports she is having bowel movements again. Pt's L knee pain is 4-5/10 since last session. It is inconsistent. There are times when she does not  have pain.    Limitations  Walking;Standing    How long can you walk comfortably?  No AD. 300 feet- length of hallway; very difficult to grocery shop.    Patient Stated Goals  Improve gait; improve balance; reduce risk of falls. (last fall was in end of April while reaching out of base of support and tripped).    Pain Onset  Other (comment)   chronic        [facility] PT Assessment - 08/14/19 1338      Sit to Stand   Comments  genu valgus       AROM   Overall AROM Comments  Cervical L 50 deg, R 60 deg ( post Tx: L 55 deg )       Palpation   Spinal mobility  T3-5 hypomobile with interspinal tightness ( improved mobility post Tx)       Ambulation/Gait   Gait Comments  minimal upper thoracic rotation with cued head turns                    [facility] Adult PT Treatment/Exercise - 08/14/19 1338  Neuro Re-ed    Neuro Re-ed Details   manually applied pertubation at waist- 20' with 4 directions for stepping strategies . Cued for more cervical retraction scauplar depression  .       Manual Therapy   Manual therapy comments  STM/MWM at upper Tsegments in sidelying                PT Short Term Goals - 07/24/19 1723      PT SHORT TERM GOAL #1   Title  Pt will demonstrate a >/= 53 on BERG balance in order to represent decreased fall risk and improved static balance.    Baseline  44/56    Time  5    Period  Weeks    Status  New    Target Date  08/28/19      PT SHORT TERM GOAL #2   Title  Patient will demonstrate improved gait speed to 1.8 m/s demonstrating appropriate community ambulation speed.    Baseline  0.31m/s    Time  5    Period  Weeks    Status  New    Target Date  08/28/19        PT Long Term Goals - 08/14/19 1442      PT LONG TERM GOAL #1   Title  Pt will demonstrate 5x sit<>stand in less than 15 seconds, demonstrating reduction of fall risk.    Baseline  initial eval- 20 seconds 5xSTS    Time  10    Period  Weeks    Status  On-going       PT LONG TERM GOAL #2   Title  Patient will report pain no higher than 4/10 on NPS at L hip and knee over the course of 1 month.    Baseline  8/10 on NPS greater than 30 minutes of walking.    Time  10    Period  Weeks    Status  On-going      PT LONG TERM GOAL #3   Title  Pt will demo stepping strategies in four directions 10/10 trials without ankle strategies/ LOB during manually applied pertubation at waist in order to minimize falls    Time  5    Period  Weeks    Status  Achieved      PT LONG TERM GOAL #4   Title  Pt will demo less forward head ( earlobe on R to acromium 16 cm, L 17cm to R 16.5 cm and 17.5 cm without cuing for cervical retraction) in order to minimize throacic kyphosis and ower risk for spinal Fx with osteopenia Dx    Time  10    Period  Weeks    Status  On-going      PT LONG TERM GOAL #5   Title  Pt will increase her cervical extension endurance test 1 : 39 to >2 min before fatigue in order to improve balance and stability for less falls risk    Time  6    Period  Weeks    Status  On-going            Plan - 08/14/19 1442    Clinical Impression Statement  Pt showed improved stepping strategy after training. Pt showed restricted upper thoracic rotation with head turns while walking. Therefore, manual Tx was applied today to further increase L cervical rotation  And cues were required for cervical retraction and scapular depression for less thoracic kyphosis. Anticipate her balance will improve  with improved cervical position and ROM. Plan to continue with more balance training at next session and assess hip and knee again. Pt continues to benefit from skilled PT.    Personal Factors and Comorbidities  Comorbidity 3+    Comorbidities  ostepenia; history of CA; anterolisthesis; fibromyalgia    Stability/Clinical Decision Making  Evolving/Moderate complexity    Rehab Potential  Fair    Clinical Impairments Affecting Rehab Potential   moderate scoliosis,  post-polio decreased smooth muscle activity, lumbar stenosis, bulging disk, anterolisthesis, osteopenia, OA, hysterectomy, double mastectomy, breast cancer, fibromyalgia, and Bronchiectasis    PT Frequency  1x / week    PT Duration  Other (comment)   10 weeks   PT Treatment/Interventions  ADLs/Self Care Home Management;Aquatic Therapy;Biofeedback;Moist Heat;Electrical Stimulation;Gait training;Therapeutic exercise;Therapeutic activities;Functional mobility training;Neuromuscular re-education;Patient/family education;Manual techniques;Scar mobilization;Dry needling;Passive range of motion;Taping;Joint Manipulations    Consulted and Agree with Plan of Care  Patient       Patient will benefit from skilled therapeutic intervention in order to improve the following deficits and impairments:  Decreased balance, Decreased endurance, Increased muscle spasms, Cardiopulmonary status limiting activity, Decreased range of motion, Impaired tone, Improper body mechanics, Decreased activity tolerance, Decreased strength, Impaired flexibility, Postural dysfunction, Pain, Hypomobility  Visit Diagnosis: Other abnormalities of gait and mobility  Left knee pain, unspecified chronicity  Pain in left hip     Problem List Patient Active Problem List   Diagnosis Date Noted  . SOB (shortness of breath) 04/30/2014  . Leg swelling 04/30/2014  . Bronchiectasis without complication ([place]) [id number]  . History of DVT (deep vein thrombosis) 04/30/2014  . Labile blood pressure 04/30/2014  . S/P IVC filter 04/30/2014  . History of right knee surgery 04/30/2014    Jerl Mina ,PT, DPT, E-RYT  08/14/2019, 2:44 PM  [place] MAIN [facility] SERVICES [address] [place], [place], [address] Phone: [phone]   Fax:  [phone]  Name: Lindsey [name] MRN: [id number] Date of Birth: 13-Mar-1936

## 2019-08-16 ENCOUNTER — Encounter: Payer: PPO | Admitting: Physical Therapy

## 2019-08-18 ENCOUNTER — Ambulatory Visit: Payer: PPO | Admitting: Physical Therapy

## 2019-08-21 ENCOUNTER — Ambulatory Visit: Payer: PPO | Admitting: Physical Therapy

## 2019-08-21 ENCOUNTER — Other Ambulatory Visit: Payer: Self-pay

## 2019-08-21 DIAGNOSIS — R2689 Other abnormalities of gait and mobility: Secondary | ICD-10-CM

## 2019-08-21 DIAGNOSIS — M25552 Pain in left hip: Secondary | ICD-10-CM

## 2019-08-21 DIAGNOSIS — M25562 Pain in left knee: Secondary | ICD-10-CM

## 2019-08-21 DIAGNOSIS — Z9181 History of falling: Secondary | ICD-10-CM

## 2019-08-21 NOTE — Patient Instructions (Addendum)
seated Feet slides :  Points of contact at sitting bones  Four points of contact of foot, Heel up, knee aligned with 2-3rd toe  Lower heel Four points of contact of foot, Slide foot back  Repeated with other foot  3 min ___  Ankle stretch standing:   ____   Walk with less heel strike, Lift foot up L;and more on ballmounds  (pink panther) \  ___ But Portable cycler and I will discuss next visit  How to use   ___ Discontinue last week's home exercise

## 2019-08-21 NOTE — Therapy (Addendum)
[place] MAIN [facility] SERVICES [address] [place], [place], [address] Phone: [phone]   Fax:  [phone]  Physical Therapy Treatment  Patient Details  Name: Lindsey [name] MRN: [id number] Date of Birth: August 06, 1936 Referring Provider (PT): Hande   Encounter Date: 08/21/2019  PT End of Session - 08/21/19 1403    Visit Number  5    Number of Visits  10    Date for PT Re-Evaluation  10/02/19    PT Start Time  1309    PT Stop Time  1410    PT Time Calculation (min)  61 min    Activity Tolerance  Patient tolerated treatment well    Behavior During Therapy  [facility] for tasks assessed/performed       Past Medical History:  Diagnosis Date  . Anemia   . Asthma   . Breast cancer ([place])   . Bronchiectasis ([place]) 06/2007  . Cancer ([place])   . Colitis   . COPD (chronic obstructive pulmonary disease) ([place])   . Depression   . DVT (deep venous thrombosis) ([place])    after her right knee surgery.   . Fibromyalgia   . Fibromyalgia   . Hypertension   . Migraine headache with aura   . Osteoarthritis   . Pneumonia   . Polio 1952    Past Surgical History:  Procedure Laterality Date  . ABDOMINAL HYSTERECTOMY    . BACK SURGERY    . BILATERAL TOTAL MASTECTOMY WITH AXILLARY LYMPH NODE DISSECTION    . CARPAL TUNNEL RELEASE     bilateral   . COLONOSCOPY WITH PROPOFOL N/A 08/02/2015   Procedure: COLONOSCOPY WITH PROPOFOL;  Surgeon: Manya Silvas, MD;  Location: [facility] ENDOSCOPY;  Service: Endoscopy;  Laterality: N/A;  . FINGER TENDON REPAIR    . HERNIA REPAIR    . REPLACEMENT TOTAL KNEE     right knee   . TONSILLECTOMY    . UMBILICAL HERNIA REPAIR    . VAGINAL HYSTERECTOMY      There were no vitals filed for this visit.  Subjective Assessment - 08/21/19 1310    Subjective  Pt reported the exercises made the hip bursitis worse    Limitations  Walking;Standing    How long can you walk comfortably?  No AD. 300 feet- length of hallway; very  difficult to grocery shop.    Patient Stated Goals  Improve gait; improve balance; reduce risk of falls. (last fall was in end of April while reaching out of base of support and tripped).    Pain Onset  Other (comment)   chronic        [facility] PT Assessment - 08/21/19 1405      Observation/Other Assessments   Observations  self-correct with cervical retraction without cues       AROM   Overall AROM Comments  AROM CKC DF: R 85 deg, L 65 deg  ( Post Tx : 75 deg )        PROM   Overall PROM Comments  PROM OKC DF: R 15 deg, L 25  ( post Tx: R 20 deg)       Palpation   Palpation comment  tightness in L intrinsic mm, extensor digitoriuim/ peroneal longus L , hypomobile tib/fib L       Ambulation/Gait   Gait Comments  L gen valgus, limited DF , hypextension of L knee in pre swing, decreasedh ip flexion/ knee flexion, DF. ( Post Tx: improved hip flexion,  knee flexion, less hyperextension, and increased DF)      Noted B lateral ankle swelling, varicose veins              [facility] Adult PT Treatment/Exercise - 08/21/19 1405      Neuro Re-ed    Neuro Re-ed Details   cued for more transverse arch co-activation and less heel striking, more hip flexion to clear the floor and minimize falls/ tripping   cued for exercises see pt isntructions     Modalities   Modalities  Moist Heat   5     Moist Heat Therapy   Moist Heat Location  --   L leg     Manual Therapy   Manual therapy comments  STM/MWM intrinsic feet mm B, L extensor digitorium. peroneal longus , PA/AP mob to faciliate DF/EV, distraction at talocrural joint, PA/AP mob at tib fib on L to promote DF/ EV                PT Short Term Goals - 07/24/19 1723      PT SHORT TERM GOAL #1   Title  Pt will demonstrate a >/= 53 on BERG balance in order to represent decreased fall risk and improved static balance.    Baseline  44/56    Time  5    Period  Weeks    Status  New    Target Date  08/28/19      PT SHORT TERM  GOAL #2   Title  Patient will demonstrate improved gait speed to 1.8 m/s demonstrating appropriate community ambulation speed.    Baseline  0.33m/s    Time  5    Period  Weeks    Status  New    Target Date  08/28/19        PT Long Term Goals - 08/14/19 1442      PT LONG TERM GOAL #1   Title  Pt will demonstrate 5x sit<>stand in less than 15 seconds, demonstrating reduction of fall risk.    Baseline  initial eval- 20 seconds 5xSTS    Time  10    Period  Weeks    Status  On-going      PT LONG TERM GOAL #2   Title  Patient will report pain no higher than 4/10 on NPS at L hip and knee over the course of 1 month.    Baseline  8/10 on NPS greater than 30 minutes of walking.    Time  10    Period  Weeks    Status  On-going      PT LONG TERM GOAL #3   Title  Pt will demo stepping strategies in four directions 10/10 trials without ankle strategies/ LOB during manually applied pertubation at waist in order to minimize falls    Time  5    Period  Weeks    Status  Achieved      PT LONG TERM GOAL #4   Title  Pt will demo less forward head ( earlobe on R to acromium 16 cm, L 17cm to R 16.5 cm and 17.5 cm without cuing for cervical retraction) in order to minimize throacic kyphosis and ower risk for spinal Fx with osteopenia Dx    Time  10    Period  Weeks    Status  On-going      PT LONG TERM GOAL #5   Title  Pt will increase her cervical extension endurance test 1 : 39  to >2 min before fatigue in order to improve balance and stability for less falls risk    Time  6    Period  Weeks    Status  On-going            Plan - 08/21/19 1404    Clinical Impression Statement  Pt's L hip and knee pain is more likely caused from the lower kinetic chain related to limited L ankle DF. Pt reported the resistance band exercises that were focused on hip abduction strengthening caused more hip bursitis pain and therefore , these exercises have been removed from her HEP.  Manual Tx to  facilitated increased L ankle DF, improved gait with less hypextension of L knee and reported decreased L hip pain.  Pt's upright posture is being maintained with pt's ability to self-correct without cues on cervical / scapular retraction.  Pt continues to benefit from skilled PT with use of a regional interdependent approach.     Personal Factors and Comorbidities  Comorbidity 3+    Comorbidities  ostepenia; history of CA; anterolisthesis; fibromyalgia    Stability/Clinical Decision Making  Evolving/Moderate complexity    Rehab Potential  Fair    Clinical Impairments Affecting Rehab Potential   moderate scoliosis, post-polio decreased smooth muscle activity, lumbar stenosis, bulging disk, anterolisthesis, osteopenia, OA, hysterectomy, double mastectomy, breast cancer, fibromyalgia, and Bronchiectasis    PT Frequency  1x / week    PT Duration  Other (comment)   10 weeks   PT Treatment/Interventions  ADLs/Self Care Home Management;Aquatic Therapy;Biofeedback;Moist Heat;Electrical Stimulation;Gait training;Therapeutic exercise;Therapeutic activities;Functional mobility training;Neuromuscular re-education;Patient/family education;Manual techniques;Scar mobilization;Dry needling;Passive range of motion;Taping;Joint Manipulations    Consulted and Agree with Plan of Care  Patient       Patient will benefit from skilled therapeutic intervention in order to improve the following deficits and impairments:  Decreased balance, Decreased endurance, Increased muscle spasms, Cardiopulmonary status limiting activity, Decreased range of motion, Impaired tone, Improper body mechanics, Decreased activity tolerance, Decreased strength, Impaired flexibility, Postural dysfunction, Pain, Hypomobility  Visit Diagnosis: Other abnormalities of gait and mobility  Left knee pain, unspecified chronicity  Pain in left hip  At risk for falls     Problem List Patient Active Problem List   Diagnosis Date Noted  .  SOB (shortness of breath) 04/30/2014  . Leg swelling 04/30/2014  . Bronchiectasis without complication ([place]) [id number]  . History of DVT (deep vein thrombosis) 04/30/2014  . Labile blood pressure 04/30/2014  . S/P IVC filter 04/30/2014  . History of right knee surgery 04/30/2014    Jerl Mina ,PT, DPT, E-RYT  08/21/2019, 5:38 PM  [place] MAIN [facility] SERVICES [address] [place], [place], [address] Phone: [phone]   Fax:  [phone]  Name: Lindsey [name] MRN: [id number] Date of Birth: August 31, 1936

## 2019-08-22 DIAGNOSIS — H353211 Exudative age-related macular degeneration, right eye, with active choroidal neovascularization: Secondary | ICD-10-CM | POA: Diagnosis not present

## 2019-08-23 ENCOUNTER — Ambulatory Visit: Payer: PPO | Admitting: Physical Therapy

## 2019-08-23 DIAGNOSIS — M1812 Unilateral primary osteoarthritis of first carpometacarpal joint, left hand: Secondary | ICD-10-CM | POA: Diagnosis not present

## 2019-08-23 DIAGNOSIS — M1712 Unilateral primary osteoarthritis, left knee: Secondary | ICD-10-CM | POA: Diagnosis not present

## 2019-08-23 DIAGNOSIS — M79645 Pain in left finger(s): Secondary | ICD-10-CM | POA: Diagnosis not present

## 2019-08-28 ENCOUNTER — Ambulatory Visit: Payer: PPO | Admitting: Physical Therapy

## 2019-09-04 ENCOUNTER — Ambulatory Visit: Payer: PPO | Admitting: Physical Therapy

## 2019-09-04 DIAGNOSIS — R519 Headache, unspecified: Secondary | ICD-10-CM | POA: Diagnosis not present

## 2019-09-06 ENCOUNTER — Encounter: Payer: PPO | Admitting: Physical Therapy

## 2019-09-08 ENCOUNTER — Encounter: Payer: PPO | Admitting: Physical Therapy

## 2019-09-08 DIAGNOSIS — G43119 Migraine with aura, intractable, without status migrainosus: Secondary | ICD-10-CM | POA: Diagnosis not present

## 2019-09-08 DIAGNOSIS — F3341 Major depressive disorder, recurrent, in partial remission: Secondary | ICD-10-CM | POA: Diagnosis not present

## 2019-09-08 DIAGNOSIS — I10 Essential (primary) hypertension: Secondary | ICD-10-CM | POA: Diagnosis not present

## 2019-09-08 DIAGNOSIS — R519 Headache, unspecified: Secondary | ICD-10-CM | POA: Diagnosis not present

## 2019-09-08 DIAGNOSIS — M797 Fibromyalgia: Secondary | ICD-10-CM | POA: Diagnosis not present

## 2019-09-08 DIAGNOSIS — M1990 Unspecified osteoarthritis, unspecified site: Secondary | ICD-10-CM | POA: Diagnosis not present

## 2019-09-11 ENCOUNTER — Ambulatory Visit: Payer: PPO | Attending: Internal Medicine | Admitting: Physical Therapy

## 2019-09-11 ENCOUNTER — Other Ambulatory Visit: Payer: Self-pay

## 2019-09-11 DIAGNOSIS — M62838 Other muscle spasm: Secondary | ICD-10-CM | POA: Diagnosis not present

## 2019-09-11 DIAGNOSIS — M4125 Other idiopathic scoliosis, thoracolumbar region: Secondary | ICD-10-CM | POA: Diagnosis not present

## 2019-09-11 DIAGNOSIS — M533 Sacrococcygeal disorders, not elsewhere classified: Secondary | ICD-10-CM | POA: Diagnosis not present

## 2019-09-11 DIAGNOSIS — M25552 Pain in left hip: Secondary | ICD-10-CM | POA: Insufficient documentation

## 2019-09-11 DIAGNOSIS — Z9181 History of falling: Secondary | ICD-10-CM

## 2019-09-11 DIAGNOSIS — M2170 Unequal limb length (acquired), unspecified site: Secondary | ICD-10-CM | POA: Diagnosis not present

## 2019-09-11 DIAGNOSIS — R2689 Other abnormalities of gait and mobility: Secondary | ICD-10-CM | POA: Diagnosis not present

## 2019-09-11 DIAGNOSIS — M25562 Pain in left knee: Secondary | ICD-10-CM | POA: Insufficient documentation

## 2019-09-11 DIAGNOSIS — G8929 Other chronic pain: Secondary | ICD-10-CM

## 2019-09-11 DIAGNOSIS — R293 Abnormal posture: Secondary | ICD-10-CM

## 2019-09-11 NOTE — Patient Instructions (Signed)
  Avoid straining pelvic floor, abdominal muscles , spine  Use log rolling technique instead of getting out of bed with your neck or the sit-up     Log rolling into and out of bed   Log rolling into and out of bed If getting out of bed on R side, Bent knees, scoot hips/ shoulder to L  Raise R arm completely overhead, rolling onto armpit  Then lower bent knees to bed to get into complete side lying position  Then drop legs off bed, and push up onto R elbow/forearm, and use L hand to push onto the bed   _________  Getting into bed:   hook ankles around each other to get into bed side lying   _________   Stretches: on back   Self hug and then Pulling sweater off motion into angel wings  10 reps    ________     band under ballmounds  while laying on back w/ knees bent  "W" exercise  10 reps x 2 sets   Place band in "U"   Band is placed under feet, knees bent, feet are hip width apart Hold band with thumbs point out, keep upper arm and elbow touching the bed the whole time  - inhale and then exhale pull bands by bending elbows hands move in a "w"  (feel shoulder blades squeezing)

## 2019-09-11 NOTE — Therapy (Addendum)
[place] MAIN [facility] SERVICES [address] [place], [place], [address] Phone: [phone]   Fax:  [phone]  Physical Therapy Treatment  Patient Details  Name: Lindsey [name] MRN: [id number] Date of Birth: Feb 09, 1936 Referring Provider (PT): Hande   Encounter Date: 09/11/2019  PT End of Session - 09/11/19 1709    Visit Number  6    Number of Visits  10    Date for PT Re-Evaluation  10/02/19    PT Start Time  1100    PT Stop Time  1200    PT Time Calculation (min)  60 min    Activity Tolerance  Patient tolerated treatment well    Behavior During Therapy  [facility] for tasks assessed/performed       Past Medical History:  Diagnosis Date  . Anemia   . Asthma   . Breast cancer ([place])   . Bronchiectasis ([place]) 06/2007  . Cancer ([place])   . Colitis   . COPD (chronic obstructive pulmonary disease) ([place])   . Depression   . DVT (deep venous thrombosis) ([place])    after her right knee surgery.   . Fibromyalgia   . Fibromyalgia   . Hypertension   . Migraine headache with aura   . Osteoarthritis   . Pneumonia   . Polio 1952    Past Surgical History:  Procedure Laterality Date  . ABDOMINAL HYSTERECTOMY    . BACK SURGERY    . BILATERAL TOTAL MASTECTOMY WITH AXILLARY LYMPH NODE DISSECTION    . CARPAL TUNNEL RELEASE     bilateral   . COLONOSCOPY WITH PROPOFOL N/A 08/02/2015   Procedure: COLONOSCOPY WITH PROPOFOL;  Surgeon: Manya Silvas, MD;  Location: [facility] ENDOSCOPY;  Service: Endoscopy;  Laterality: N/A;  . FINGER TENDON REPAIR    . HERNIA REPAIR    . REPLACEMENT TOTAL KNEE     right knee   . TONSILLECTOMY    . UMBILICAL HERNIA REPAIR    . VAGINAL HYSTERECTOMY      There were no vitals filed for this visit.  Subjective Assessment - 09/11/19 1102    Subjective  Yesterday  while shopping, pt felt weak in her L hand, it started tingling and like it went dead. Pt had a "propioceptive blank not knowing where her hands were" and she  could not tell the difference her left and right hand. "   Pt had migraines last Monday , aura while driving. Pt went to the walk in clinic and they gave her a shot for pain. The shot helpe but the migraines kept persisting and coming back. Pt went to see her chiropractic and she gently adjusted her neck and the migraines were way better for one day. The migraines are on the R posterior/ lateral and temple area. Pt has not had any chest pain. Her equilibrium has been off as if someone can push her over. Denied chest pain. Pt has checked her BP.  Pt saw Dr. Ginette Pitman last week and started a new medication on Friday. Pt called to let his office know that the first medication caused nausea and dizziness. Then the pain medication that he later prescribed and it has been successfull. Pt has had the equilibrium issue for the past 3-4 weeks  and she felt she has to hold on to something to make "sure I don't topple over".  Her L knee is not worse. Pt has started to use the portable cycler.    Limitations  Walking;Standing  How long can you walk comfortably?  No AD. 300 feet- length of hallway; very difficult to grocery shop.    Patient Stated Goals  Improve gait; improve balance; reduce risk of falls. (last fall was in end of April while reaching out of base of support and tripped).    Pain Onset  Other (comment)   chronic        [facility] PT Assessment - 09/11/19 1159      Observation/Other Assessments   Observations  cranial nerve tests neg.  MMT B UE 4+/5, BLE 5/5        Palpation   Palpation comment  tightness posterior ribs/ intercostals, supscapularis, low trap/ lats B to promote thoracic ext                     [facility] Adult PT Treatment/Exercise - 09/11/19 1159      Bed Mobility   Bed Mobility  --   head crunch      Therapeutic Activites    Other Therapeutic Activities  active listening to equilibrium / migraine Sx over the past weeks, triaged whether needed to go to ER, communicate  with MD, and decision to continue with PT      Neuro Re-ed    Neuro Re-ed Details   cued for resistance band HEP for scap/ cervical retraction       Modalities   Modalities  Moist Heat   5     Moist Heat Therapy   Moist Heat Location  --   midback      Manual Therapy   Manual therapy comments  STM/MWM along mm notes in assessment                PT Short Term Goals - 07/24/19 1723      PT SHORT TERM GOAL #1   Title  Pt will demonstrate a >/= 53 on BERG balance in order to represent decreased fall risk and improved static balance.    Baseline  44/56    Time  5    Period  Weeks    Status  New    Target Date  08/28/19      PT SHORT TERM GOAL #2   Title  Patient will demonstrate improved gait speed to 1.8 m/s demonstrating appropriate community ambulation speed.    Baseline  0.74m/s    Time  5    Period  Weeks    Status  New    Target Date  08/28/19        PT Long Term Goals - 08/14/19 1442      PT LONG TERM GOAL #1   Title  Pt will demonstrate 5x sit<>stand in less than 15 seconds, demonstrating reduction of fall risk.    Baseline  initial eval- 20 seconds 5xSTS    Time  10    Period  Weeks    Status  On-going      PT LONG TERM GOAL #2   Title  Patient will report pain no higher than 4/10 on NPS at L hip and knee over the course of 1 month.    Baseline  8/10 on NPS greater than 30 minutes of walking.    Time  10    Period  Weeks    Status  On-going      PT LONG TERM GOAL #3   Title  Pt will demo stepping strategies in four directions 10/10 trials without ankle strategies/ LOB during manually applied pertubation  at waist in order to minimize falls    Time  5    Period  Weeks    Status  Achieved      PT LONG TERM GOAL #4   Title  Pt will demo less forward head ( earlobe on R to acromium 16 cm, L 17cm to R 16.5 cm and 17.5 cm without cuing for cervical retraction) in order to minimize throacic kyphosis and ower risk for spinal Fx with osteopenia Dx     Time  10    Period  Weeks    Status  On-going      PT LONG TERM GOAL #5   Title  Pt will increase her cervical extension endurance test 1 : 39 to >2 min before fatigue in order to improve balance and stability for less falls risk    Time  6    Period  Weeks    Status  On-going            Plan - 09/11/19 1711    Clinical Impression Statement Pt reported migraines and equilibrium issues over the past few weeks which she has followed up with her PCP and PCP has prescribed her medications. Pt reported she want to go to the ER. Recommended pt to monitor and f/u with cardiologist. Pt voiced understanding to seek immediate attention if red flag Sx occur.  Cranial nerve tests were neg. Denied chest pain. Decided pt was appropriate for continuing with PT.    Pt demo'd increased tightness at B posterior ribs/ intercostals, supscapularis, low trap/ lats B and manual Tx was applied to promote thoracic ext , scapular/ cervical retraction post Tx. Progressed pt to resistance band strengthening of thoracolumbar strengthening which will help with cervical propioception and improved balance.  BLE strengthening has increased. Educated on use of armrests in chair when using portable cycler to promote more CKC thoracolumbar strengthening. Pt continues to benefit from skilled PT.     Personal Factors and Comorbidities  Comorbidity 3+    Comorbidities  ostepenia; history of CA; anterolisthesis; fibromyalgia    Stability/Clinical Decision Making  Evolving/Moderate complexity    Rehab Potential  Fair    Clinical Impairments Affecting Rehab Potential   moderate scoliosis, post-polio decreased smooth muscle activity, lumbar stenosis, bulging disk, anterolisthesis, osteopenia, OA, hysterectomy, double mastectomy, breast cancer, fibromyalgia, and Bronchiectasis    PT Frequency  1x / week    PT Duration  Other (comment)   10 weeks   PT Treatment/Interventions  ADLs/Self Care Home Management;Aquatic  Therapy;Biofeedback;Moist Heat;Electrical Stimulation;Gait training;Therapeutic exercise;Therapeutic activities;Functional mobility training;Neuromuscular re-education;Patient/family education;Manual techniques;Scar mobilization;Dry needling;Passive range of motion;Taping;Joint Manipulations    Consulted and Agree with Plan of Care  Patient       Patient will benefit from skilled therapeutic intervention in order to improve the following deficits and impairments:  Decreased balance, Decreased endurance, Increased muscle spasms, Cardiopulmonary status limiting activity, Decreased range of motion, Impaired tone, Improper body mechanics, Decreased activity tolerance, Decreased strength, Impaired flexibility, Postural dysfunction, Pain, Hypomobility  Visit Diagnosis: Left knee pain, unspecified chronicity  Pain in left hip  At risk for falls  Other abnormalities of gait and mobility  Abnormal posture  Sacrococcygeal disorders, not elsewhere classified  Other muscle spasm  Other idiopathic scoliosis, thoracolumbar region  Leg length discrepancy  Chronic pain of left knee     Problem List Patient Active Problem List   Diagnosis Date Noted  . SOB (shortness of breath) 04/30/2014  . Leg swelling 04/30/2014  . Bronchiectasis without  complication ([place]) [id number]  . History of DVT (deep vein thrombosis) 04/30/2014  . Labile blood pressure 04/30/2014  . S/P IVC filter 04/30/2014  . History of right knee surgery 04/30/2014    Jerl Mina ,PT, DPT, E-RYT  09/11/2019, 5:12 PM  [place] MAIN [facility] SERVICES [address] [place], [place], [address] Phone: [phone]   Fax:  [phone]  Name: ALYONA KANU MRN: [id number] Date of Birth: [birth year]

## 2019-09-20 ENCOUNTER — Ambulatory Visit: Payer: PPO | Admitting: Physical Therapy

## 2019-09-20 ENCOUNTER — Telehealth: Payer: Self-pay | Admitting: *Deleted

## 2019-09-20 ENCOUNTER — Other Ambulatory Visit: Payer: Self-pay

## 2019-09-20 ENCOUNTER — Emergency Department: Payer: PPO

## 2019-09-20 ENCOUNTER — Emergency Department
Admission: EM | Admit: 2019-09-20 | Discharge: 2019-09-20 | Disposition: A | Discharge status: Home / Self Care | Payer: PPO | Attending: Emergency Medicine | Admitting: Emergency Medicine

## 2019-09-20 DIAGNOSIS — Z79899 Other long term (current) drug therapy: Secondary | ICD-10-CM | POA: Insufficient documentation

## 2019-09-20 DIAGNOSIS — R200 Anesthesia of skin: Secondary | ICD-10-CM | POA: Diagnosis not present

## 2019-09-20 DIAGNOSIS — R519 Headache, unspecified: Secondary | ICD-10-CM | POA: Diagnosis not present

## 2019-09-20 DIAGNOSIS — I10 Essential (primary) hypertension: Secondary | ICD-10-CM | POA: Insufficient documentation

## 2019-09-20 DIAGNOSIS — Z96651 Presence of right artificial knee joint: Secondary | ICD-10-CM | POA: Diagnosis not present

## 2019-09-20 DIAGNOSIS — G43101 Migraine with aura, not intractable, with status migrainosus: Secondary | ICD-10-CM | POA: Diagnosis not present

## 2019-09-20 DIAGNOSIS — Z853 Personal history of malignant neoplasm of breast: Secondary | ICD-10-CM | POA: Diagnosis not present

## 2019-09-20 DIAGNOSIS — R52 Pain, unspecified: Secondary | ICD-10-CM | POA: Diagnosis not present

## 2019-09-20 DIAGNOSIS — J449 Chronic obstructive pulmonary disease, unspecified: Secondary | ICD-10-CM | POA: Diagnosis not present

## 2019-09-20 LAB — CBC WITH DIFFERENTIAL/PLATELET
Abs Immature Granulocytes: 0.01 10*3/uL (ref 0.00–0.07)
Basophils Absolute: 0.1 10*3/uL (ref 0.0–0.1)
Basophils Relative: 1 %
Eosinophils Absolute: 0.2 10*3/uL (ref 0.0–0.5)
Eosinophils Relative: 4 %
HCT: 38.2 % (ref 36.0–46.0)
Hemoglobin: 12.5 g/dL (ref 12.0–15.0)
Immature Granulocytes: 0 %
Lymphocytes Relative: 27 %
Lymphs Abs: 1.2 10*3/uL (ref 0.7–4.0)
MCH: 29.6 pg (ref 26.0–34.0)
MCHC: 32.7 g/dL (ref 30.0–36.0)
MCV: 90.3 fL (ref 80.0–100.0)
Monocytes Absolute: 0.5 10*3/uL (ref 0.1–1.0)
Monocytes Relative: 11 %
Neutro Abs: 2.5 10*3/uL (ref 1.7–7.7)
Neutrophils Relative %: 57 %
Platelets: 207 10*3/uL (ref 150–400)
RBC: 4.23 MIL/uL (ref 3.87–5.11)
RDW: 13.6 % (ref 11.5–15.5)
WBC: 4.5 10*3/uL (ref 4.0–10.5)
nRBC: 0.4 % — ABNORMAL HIGH (ref 0.0–0.2)

## 2019-09-20 LAB — COMPREHENSIVE METABOLIC PANEL
ALT: 28 U/L (ref 0–44)
AST: 55 U/L — ABNORMAL HIGH (ref 15–41)
Albumin: 3.9 g/dL (ref 3.5–5.0)
Alkaline Phosphatase: 52 U/L (ref 38–126)
Anion gap: 9 (ref 5–15)
BUN: 17 mg/dL (ref 8–23)
CO2: 28 mmol/L (ref 22–32)
Calcium: 8.9 mg/dL (ref 8.9–10.3)
Chloride: 100 mmol/L (ref 98–111)
Creatinine, Ser: 0.63 mg/dL (ref 0.44–1.00)
GFR calc Af Amer: >60 mL/min (ref >60)
GFR calc non Af Amer: >60 mL/min (ref >60)
Glucose, Bld: 98 mg/dL (ref 70–99)
Potassium: 3.5 mmol/L (ref 3.5–5.1)
Sodium: 137 mmol/L (ref 135–145)
Total Bilirubin: 0.7 mg/dL (ref 0.3–1.2)
Total Protein: 5.9 g/dL — ABNORMAL LOW (ref 6.5–8.1)

## 2019-09-20 LAB — TROPONIN I (HIGH SENSITIVITY): Troponin I (High Sensitivity): 11 ng/L (ref <18)

## 2019-09-20 MED ORDER — BUTALBITAL-APAP-CAFFEINE 50-325-40 MG PO TABS: 1.0000 | ORAL_TABLET | Freq: Once | ORAL | Status: DC

## 2019-09-20 MED ORDER — MORPHINE SULFATE (PF) 2 MG/ML IV SOLN
2.0000 mg | Freq: Once | INTRAVENOUS | Status: AC
  Administered 2019-09-20: 2 mg via INTRAVENOUS
  Filled 2019-09-20: qty 1

## 2019-09-20 MED ORDER — DOXAZOSIN MESYLATE 2 MG PO TABS
2.0000 mg | ORAL_TABLET | Freq: Once | ORAL | Status: AC
  Administered 2019-09-20: 2 mg via ORAL
  Filled 2019-09-20: qty 1

## 2019-09-20 MED ORDER — KETOROLAC TROMETHAMINE 30 MG/ML IJ SOLN
15.0000 mg | Freq: Once | INTRAMUSCULAR | Status: AC
  Administered 2019-09-20: 15 mg via INTRAVENOUS
  Filled 2019-09-20: qty 1

## 2019-09-20 MED ORDER — LOSARTAN POTASSIUM 50 MG PO TABS
100.0000 mg | ORAL_TABLET | Freq: Once | ORAL | Status: AC
  Administered 2019-09-20: 100 mg via ORAL
  Filled 2019-09-20: qty 2

## 2019-09-20 MED ORDER — ONDANSETRON HCL 4 MG/2ML IJ SOLN
4.0000 mg | Freq: Once | INTRAMUSCULAR | Status: AC
  Administered 2019-09-20: 4 mg via INTRAVENOUS
  Filled 2019-09-20: qty 2

## 2019-09-20 MED ORDER — HYDROCHLOROTHIAZIDE 25 MG PO TABS
25.0000 mg | ORAL_TABLET | Freq: Once | ORAL | Status: AC
  Administered 2019-09-20: 25 mg via ORAL
  Filled 2019-09-20: qty 1

## 2019-09-20 MED ORDER — DROPERIDOL 2.5 MG/ML IJ SOLN
1.2500 mg | Freq: Once | INTRAMUSCULAR | Status: AC
  Administered 2019-09-20: 1.25 mg via INTRAVENOUS
  Filled 2019-09-20: qty 2

## 2019-09-20 NOTE — Telephone Encounter (Signed)
-----   Message from Minna Merritts, MD sent at 09/20/2019  1:27 PM EST ----- Regarding: er Was seen in the er for migraine today BP was very high Can we follow up and make sure blood pressure has come down Thx TG

## 2019-09-20 NOTE — ED Provider Notes (Signed)
[facility] Emergency Department Provider Note  ____________________________________________  Time seen: Approximately 5:00 AM  I have reviewed the triage vital signs and the nursing notes.   HISTORY  Chief Complaint Hypertension   HPI Lindsey [name] is a 83 y.o. female with a history of migraine headaches, fibromyalgia, bronchiectasis, hypertension who presents for evaluation of a headache.  Patient reports several almost daily severe right-sided headaches for the last 3 weeks.   Saw her PCP 12 days ago for the same.  She was started on Nurtec.  She reports that it took her a week to receive the medication.  She took a dose 2 nights ago and that helped.  She took a dose last night and that did not help.  She is complaining of a 9 out of 10 headache.  Her headaches are all preceded by visual aura and they are on the right side which is consistent with her migraine headaches.  She usually does not have them as severe or as frequent as she has been having over the last 3 weeks.  No trauma, no fever, no changes in vision, no slurred speech, no facial droop, no gait abnormality.  Patient is concerned that she might have had 2 TIAs last week.  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  She reports that she was having a hard time counting the money which is very typical for her.  The second 1 the day after she was shopping in her left arm went completely numb.  That resolved.  She denies having any slurred speech, facial droop, or left leg weakness with that episode.  She denies any prior history of strokes.  Past Medical History:  Diagnosis Date  . Anemia   . Asthma   . Breast cancer ([place])   . Bronchiectasis ([place]) 06/2007  . Cancer ([place])   . Colitis   . COPD (chronic obstructive pulmonary disease) ([place])   . Depression   . DVT (deep venous thrombosis) ([place])    after her right knee surgery.   . Fibromyalgia   .  Fibromyalgia   . Hypertension   . Migraine headache with aura   . Osteoarthritis   . Pneumonia   . Polio 1952    Patient Active Problem List   Diagnosis Date Noted  . SOB (shortness of breath) 04/30/2014  . Leg swelling 04/30/2014  . Bronchiectasis without complication ([place]) [id number]  . History of DVT (deep vein thrombosis) 04/30/2014  . Labile blood pressure 04/30/2014  . S/P IVC filter 04/30/2014  . History of right knee surgery 04/30/2014    Past Surgical History:  Procedure Laterality Date  . ABDOMINAL HYSTERECTOMY    . BACK SURGERY    . BILATERAL TOTAL MASTECTOMY WITH AXILLARY LYMPH NODE DISSECTION    . CARPAL TUNNEL RELEASE     bilateral   . COLONOSCOPY WITH PROPOFOL N/A 08/02/2015   Procedure: COLONOSCOPY WITH PROPOFOL;  Surgeon: Manya Silvas, MD;  Location: [facility] ENDOSCOPY;  Service: Endoscopy;  Laterality: N/A;  . FINGER TENDON REPAIR    . HERNIA REPAIR    . REPLACEMENT TOTAL KNEE     right knee   . TONSILLECTOMY    . UMBILICAL HERNIA REPAIR    . VAGINAL HYSTERECTOMY      Prior to Admission medications   Medication Sig Start Date End Date Taking? Authorizing Provider  albuterol (PROVENTIL HFA;VENTOLIN HFA) 108 (90 Base) MCG/ACT  inhaler Inhale into the lungs every 6 (six) hours as needed for wheezing or shortness of breath.    [provider]  budesonide (ENTOCORT EC) 3 MG 24 hr capsule Take by mouth daily. Taking PRN based on diarrhea    [provider]  cephALEXin (KEFLEX) 500 MG capsule Take 1 capsule (500 mg total) by mouth 2 (two) times daily. 03/01/19   Evelina Dun A, FNP  cyclobenzaprine (FLEXERIL) 5 MG tablet Take 5 mg by mouth as needed.     [provider]  doxazosin (CARDURA) 4 MG tablet TAKE 1/2 TABLET BY MOUTH TWICE DAILY 12/20/18   Minna Merritts, MD  hydrALAZINE (APRESOLINE) 25 MG tablet Take 1 tablet (25 mg total) by mouth as needed (for BP >170). 04/15/17   Minna Merritts, MD  hydrochlorothiazide (HYDRODIURIL)  25 MG tablet TAKE 1 TABLET BY MOUTH DAILY 12/20/18   Minna Merritts, MD  hydrOXYzine (ATARAX/VISTARIL) 25 MG tablet Take 25 mg by mouth 3 (three) times daily as needed (Pt. taking 1/2 a pill).    [provider]  losartan (COZAAR) 100 MG tablet TAKE 1 TABLET DAILY 12/20/18   Minna Merritts, MD  Omega-3 Fatty Acids (FISH OIL) 1000 MG CAPS Take 1,000 mg by mouth daily.    [provider]  polyethylene glycol (MIRALAX / GLYCOLAX) packet Take 17 g by mouth daily. Not taking regularly    [provider]  predniSONE (STERAPRED UNI-PAK 21 TAB) 10 MG (21) TBPK tablet Take by mouth as needed.    [provider]  rizatriptan (MAXALT) 10 MG tablet Take 10 mg by mouth as needed for migraine. May repeat in 2 hours if needed    [provider]  SYMBICORT 160-4.5 MCG/ACT inhaler Inhale 2 puffs into the lungs daily.  03/16/17   [provider]  terconazole (TERAZOL 7) 0.4 % vaginal cream Place 1 applicator vaginally at bedtime.    [provider]  venlafaxine (EFFEXOR) 37.5 MG tablet Take 37.5 mg by mouth 1 day or 1 dose.    [provider]    Allergies Ivp dye [iodinated diagnostic agents], Codeine, Sulfa antibiotics, Tegaderm ag mesh [silver], and Tetracyclines & related  Family History  Family history unknown: Yes    Social History Social History   Tobacco Use  . Smoking status: Never Smoker  . Smokeless tobacco: Never Used  Substance Use Topics  . Alcohol use: Yes    Alcohol/week: 1.0 standard drinks    Types: 1 Glasses of wine per week  . Drug use: No    Review of Systems  Constitutional: Negative for fever. Eyes: Negative for visual changes. ENT: Negative for sore throat. Neck: No neck pain  Cardiovascular: Negative for chest pain. Respiratory: Negative for shortness of breath. Gastrointestinal: Negative for abdominal pain, vomiting or diarrhea. Genitourinary: Negative for dysuria. Musculoskeletal: Negative for  back pain. Skin: Negative for rash. Neurological: Negative for weakness or numbness. + HA Psych: No SI or HI  ____________________________________________   PHYSICAL EXAM:  VITAL SIGNS: ED Triage Vitals  Enc Vitals Group     BP 09/20/19 0445 (!) 197/98     Pulse Rate 09/20/19 0445 70     Resp 09/20/19 0445 15     Temp 09/20/19 0445 97.9 F (36.6 C)     Temp Source 09/20/19 0445 Oral     SpO2 09/20/19 0442 97 %     Weight 09/20/19 0446 130 lb (59 kg)     Height 09/20/19 0446  5\' 8"  (1.727 m)     Head Circumference --      Peak Flow --      Pain Score 09/20/19 0445 0     Pain Loc --      Pain Edu? --      Excl. in [place]? --     Constitutional: Alert and oriented. Well appearing and in no apparent distress. HEENT:      Head: Normocephalic and atraumatic.         Eyes: Conjunctivae are normal. Sclera is non-icteric.       Mouth/Throat: Mucous membranes are moist.       Neck: Supple with no signs of meningismus. Cardiovascular: Regular rate and rhythm. No murmurs, gallops, or rubs. 2+ symmetrical distal pulses are present in all extremities. No JVD. Respiratory: Normal respiratory effort. Lungs are clear to auscultation bilaterally. No wheezes, crackles, or rhonchi.  Gastrointestinal: Soft, non tender, and non distended with positive bowel sounds. No rebound or guarding. Musculoskeletal: Nontender with normal range of motion in all extremities. No edema, cyanosis, or erythema of extremities. Neurologic: Normal speech and language. Face is symmetric.  Extraocular movements are intact, pupils are equal round and reactive, intact strength and sensation x4, no pronator drift, no dysmetria Skin: Skin is warm, dry and intact. No rash noted. Psychiatric: Mood and affect are normal. Speech and behavior are normal.  ____________________________________________   LABS (all labs ordered are listed, but only abnormal results are displayed)  Labs Reviewed  CBC WITH DIFFERENTIAL/PLATELET -  Abnormal; Notable for the following components:      Result Value   nRBC 0.4 (*)    All other components within normal limits  COMPREHENSIVE METABOLIC PANEL - Abnormal; Notable for the following components:   Total Protein 5.9 (*)    AST 55 (*)    All other components within normal limits  TROPONIN I (HIGH SENSITIVITY)   ____________________________________________  EKG  ED ECG REPORT I, Rudene Re, the attending physician, personally viewed and interpreted this ECG.  Normal sinus rhythm with frequent PVCs, rate of 60, right bundle branch block, LVH, normal QTC, left axis deviation, no ST elevations or depressions.  Unchanged from prior. ____________________________________________  RADIOLOGY  I have personally reviewed the images performed during this visit and I agree with the Radiologist's read.   Interpretation by Radiologist:  CT Head Wo Contrast  Result Date: 09/20/2019 CLINICAL DATA:  Acute headache with normal neuro exam EXAM: CT HEAD WITHOUT CONTRAST TECHNIQUE: Contiguous axial images were obtained from the base of the skull through the vertex without intravenous contrast. COMPARISON:  04/12/2014 FINDINGS: Brain: No evidence of acute infarction, hemorrhage, hydrocephalus, extra-axial collection or mass lesion/mass effect. Cerebral volume loss without specific pattern, increased when the lateral ventricular size is measured. Mild to moderate for age periventricular chronic small vessel ischemic change which is progressed from 18. Vascular: Mildly tortuous basilar. No hyperdense vessel or unexpected calcification Skull: Negative. Sinuses/Orbits: Bilateral cataract resection IMPRESSION: 1. No acute finding. 2. Senescent changes which have progressed from 2015. Electronically Signed   By: Monte Fantasia M.D.   On: 09/20/2019 05:34      ____________________________________________   PROCEDURES  Procedure(s) performed: None Procedures Critical Care performed:   None ____________________________________________   INITIAL IMPRESSION / ASSESSMENT AND PLAN / ED COURSE  83 y.o. female with a history of migraine headaches, fibromyalgia, bronchiectasis, hypertension who presents for evaluation of severe R sided migraine headaches for the last 3 weeks.  Patient is neurologically intact, she is  hypertensive with BP of 197/98 but she is also complaining of 9 out of 10 pain.  She is completely neurologically intact, she has no fever, no meningeal signs, no other signs of infection.  Will get a head CT to rule out intracranial bleed vs stroke. Will check labs. Will give morphine and zofran for symptoms and then reassess her BP.  Clinical Course as of Sep 19 658  Wed Sep 20, 2019  [id number] Patient reassessed after receiving morphine and Zofran.  Pain is unchanged.  Head CT negative.  Will give IV droperidol and Toradol.  Will get an MRI.  We will give her morning antihypertensive medications since her blood pressure remains elevated.   [CV]  [id number] MRI pending. Care transferred to Dr. Jimmye Norman    [CV]    Clinical Course User Index [CV] Alfred Levins [place], MD      As part of my medical decision making, I reviewed the following data within the [facility] notes reviewed and incorporated, Labs reviewed , EKG interpreted , Old EKG reviewed, Old chart reviewed, Patient signed out to Dr. Jimmye Norman, Radiograph reviewed , Notes from prior ED visits and [place] Controlled Substance Database   Please note:  Patient was evaluated in Emergency Department today for the symptoms described in the history of present illness. Patient was evaluated in the context of the global COVID-19 pandemic, which necessitated consideration that the patient might be at risk for infection with the SARS-CoV-2 virus that causes COVID-19. Institutional protocols and algorithms that pertain to the evaluation of patients at risk for COVID-19 are in a state of rapid change based on  information released by regulatory bodies including the CDC and federal and state organizations. These policies and algorithms were followed during the patient's care in the ED.  Some ED evaluations and interventions may be delayed as a result of limited staffing during the pandemic.   ____________________________________________   FINAL CLINICAL IMPRESSION(S) / ED DIAGNOSES   Final diagnoses:  Migraine with aura and with status migrainosus, not intractable      NEW MEDICATIONS STARTED DURING THIS VISIT:  ED Discharge Orders    None       Note:  This document was prepared using Dragon voice recognition software and may include unintentional dictation errors.    Rudene Re, MD 09/20/19 [phone]

## 2019-09-20 NOTE — ED Provider Notes (Signed)
MRI is unremarkable and she has had resolution of her symptoms.  She is cleared for outpatient follow-up and agrees with this plan.   Earleen Newport, MD 09/20/19 [phone]

## 2019-09-20 NOTE — Telephone Encounter (Signed)
Spoke with patient and she has not checked her blood pressures since being home. Instructed her to monitor her blood pressures and keep a log of the readings. Advised that she can send [place] those numbers by phone or by Mychart. She was so appreciative for calling to check on her. She does feel like she has had some TIA's over the last few weeks. Instructed her to please call us if her blood pressures are 140/90 or higher.  She states that she has battled migraines over the last couple of weeks. She has seen provider for this and discussed potential botox injections. Requested that she keep [place] updated and if blood pressures persist elevated. She verbalized understanding of our conversation, agreement with plan, and had no further questions at this time.

## 2019-09-20 NOTE — ED Triage Notes (Signed)
Patient arrived by [facility] from Home. Patient resides at independent housing at [place]. Patient has complaints HNT and self dx of TIA. Patient states started last week with having numbness on left side of body and having rt sided pain along with Headache, and bouts of mind cloudness.

## 2019-09-21 ENCOUNTER — Telehealth: Payer: Self-pay | Admitting: Cardiovascular Disease

## 2019-09-21 NOTE — Telephone Encounter (Signed)
Spoke with patient. Reports she is much better today as this is the first day she has not had a horrible headache. No complaints at this time. She is satisfied with appointment as scheduled and will let us know if any new or worsening concerns arise.

## 2019-09-21 NOTE — Telephone Encounter (Signed)
Pt c/o BP issue: STAT if pt c/o blurred vision, one-sided weakness or slurred speech  1. What are your last 5 BP readings?  Today 149/79  2. Are you having any other symptoms (ex. Dizziness, headache, blurred vision, passed out)? Horrific headache migraines daily for 2 weeks   3. What is your BP issue? Recent visit to ED for headaches and realized bp was elevated    Scheduled 12/21 with caitlin

## 2019-09-22 ENCOUNTER — Other Ambulatory Visit: Payer: Self-pay

## 2019-09-22 ENCOUNTER — Encounter: Payer: Self-pay | Admitting: Emergency Medicine

## 2019-09-22 ENCOUNTER — Emergency Department: Payer: PPO

## 2019-09-22 ENCOUNTER — Emergency Department
Admission: EM | Admit: 2019-09-22 | Discharge: 2019-09-22 | Disposition: A | Discharge status: Home / Self Care | Payer: PPO | Attending: Emergency Medicine | Admitting: Emergency Medicine

## 2019-09-22 DIAGNOSIS — G4489 Other headache syndrome: Secondary | ICD-10-CM | POA: Diagnosis not present

## 2019-09-22 DIAGNOSIS — R030 Elevated blood-pressure reading, without diagnosis of hypertension: Secondary | ICD-10-CM | POA: Diagnosis not present

## 2019-09-22 DIAGNOSIS — I10 Essential (primary) hypertension: Secondary | ICD-10-CM | POA: Insufficient documentation

## 2019-09-22 DIAGNOSIS — G459 Transient cerebral ischemic attack, unspecified: Secondary | ICD-10-CM | POA: Diagnosis not present

## 2019-09-22 DIAGNOSIS — R5381 Other malaise: Secondary | ICD-10-CM | POA: Diagnosis not present

## 2019-09-22 DIAGNOSIS — N390 Urinary tract infection, site not specified: Secondary | ICD-10-CM | POA: Diagnosis not present

## 2019-09-22 DIAGNOSIS — R519 Headache, unspecified: Secondary | ICD-10-CM | POA: Diagnosis present

## 2019-09-22 DIAGNOSIS — J449 Chronic obstructive pulmonary disease, unspecified: Secondary | ICD-10-CM | POA: Diagnosis not present

## 2019-09-22 DIAGNOSIS — G43809 Other migraine, not intractable, without status migrainosus: Secondary | ICD-10-CM | POA: Diagnosis not present

## 2019-09-22 DIAGNOSIS — Z79899 Other long term (current) drug therapy: Secondary | ICD-10-CM | POA: Insufficient documentation

## 2019-09-22 DIAGNOSIS — H538 Other visual disturbances: Secondary | ICD-10-CM | POA: Diagnosis not present

## 2019-09-22 DIAGNOSIS — R29898 Other symptoms and signs involving the musculoskeletal system: Secondary | ICD-10-CM | POA: Diagnosis not present

## 2019-09-22 LAB — URINALYSIS, COMPLETE (UACMP) WITH MICROSCOPIC
Bacteria, UA: NONE SEEN
Bilirubin Urine: NEGATIVE
Glucose, UA: NEGATIVE mg/dL
Hgb urine dipstick: NEGATIVE
Ketones, ur: NEGATIVE mg/dL
Nitrite: NEGATIVE
Protein, ur: NEGATIVE mg/dL
Specific Gravity, Urine: 1.009 (ref 1.005–1.030)
pH: 7 (ref 5.0–8.0)

## 2019-09-22 LAB — CBC WITH DIFFERENTIAL/PLATELET
Abs Immature Granulocytes: 0.02 10*3/uL (ref 0.00–0.07)
Basophils Absolute: 0.1 10*3/uL (ref 0.0–0.1)
Basophils Relative: 1 %
Eosinophils Absolute: 0.2 10*3/uL (ref 0.0–0.5)
Eosinophils Relative: 2 %
HCT: 40.2 % (ref 36.0–46.0)
Hemoglobin: 13.3 g/dL (ref 12.0–15.0)
Immature Granulocytes: 0 %
Lymphocytes Relative: 20 %
Lymphs Abs: 1.3 10*3/uL (ref 0.7–4.0)
MCH: 29.8 pg (ref 26.0–34.0)
MCHC: 33.1 g/dL (ref 30.0–36.0)
MCV: 89.9 fL (ref 80.0–100.0)
Monocytes Absolute: 0.6 10*3/uL (ref 0.1–1.0)
Monocytes Relative: 9 %
Neutro Abs: 4.5 10*3/uL (ref 1.7–7.7)
Neutrophils Relative %: 68 %
Platelets: 214 10*3/uL (ref 150–400)
RBC: 4.47 MIL/uL (ref 3.87–5.11)
RDW: 13.6 % (ref 11.5–15.5)
WBC: 6.5 10*3/uL (ref 4.0–10.5)
nRBC: 0 % (ref 0.0–0.2)

## 2019-09-22 LAB — COMPREHENSIVE METABOLIC PANEL
ALT: 28 U/L (ref 0–44)
AST: 54 U/L — ABNORMAL HIGH (ref 15–41)
Albumin: 4.3 g/dL (ref 3.5–5.0)
Alkaline Phosphatase: 53 U/L (ref 38–126)
Anion gap: 8 (ref 5–15)
BUN: 20 mg/dL (ref 8–23)
CO2: 30 mmol/L (ref 22–32)
Calcium: 9.2 mg/dL (ref 8.9–10.3)
Chloride: 93 mmol/L — ABNORMAL LOW (ref 98–111)
Creatinine, Ser: 0.63 mg/dL (ref 0.44–1.00)
GFR calc Af Amer: >60 mL/min (ref >60)
GFR calc non Af Amer: >60 mL/min (ref >60)
Glucose, Bld: 114 mg/dL — ABNORMAL HIGH (ref 70–99)
Potassium: 3.8 mmol/L (ref 3.5–5.1)
Sodium: 131 mmol/L — ABNORMAL LOW (ref 135–145)
Total Bilirubin: 0.8 mg/dL (ref 0.3–1.2)
Total Protein: 6.5 g/dL (ref 6.5–8.1)

## 2019-09-22 LAB — TROPONIN I (HIGH SENSITIVITY)
Troponin I (High Sensitivity): 10 ng/L (ref <18)
Troponin I (High Sensitivity): 20 ng/L — ABNORMAL HIGH (ref <18)
Troponin I (High Sensitivity): 21 ng/L — ABNORMAL HIGH (ref <18)

## 2019-09-22 MED ORDER — SODIUM CHLORIDE 0.9 % IV SOLN
1.0000 g | Freq: Once | INTRAVENOUS | Status: AC
  Administered 2019-09-22: 1 g via INTRAVENOUS

## 2019-09-22 MED ORDER — CEPHALEXIN 500 MG PO CAPS: 500.0000 mg | ORAL_CAPSULE | Freq: Two times a day (BID) | ORAL | 0 refills | Status: DC

## 2019-09-22 MED ORDER — DROPERIDOL 2.5 MG/ML IJ SOLN
1.2500 mg | Freq: Once | INTRAMUSCULAR | Status: AC
  Administered 2019-09-22: 1.25 mg via INTRAVENOUS
  Filled 2019-09-22: qty 2

## 2019-09-22 MED ORDER — HYDRALAZINE HCL 20 MG/ML IJ SOLN
10.0000 mg | Freq: Once | INTRAMUSCULAR | Status: AC
  Administered 2019-09-22: 10 mg via INTRAVENOUS
  Filled 2019-09-22: qty 1

## 2019-09-22 MED ORDER — KETOROLAC TROMETHAMINE 30 MG/ML IJ SOLN
10.0000 mg | Freq: Once | INTRAMUSCULAR | Status: AC
  Administered 2019-09-22: 9.9 mg via INTRAVENOUS
  Filled 2019-09-22: qty 1

## 2019-09-22 NOTE — Discharge Instructions (Signed)
1.  Take antibiotic as prescribed (Keflex 500 mg twice daily x7 days). 2.  Talk to your doctor about restarting HCTZ. 3.  Take Hydralazine 25 mg as needed for SBP>170 (this is the top number of your blood pressure).  Do not take more than 2 doses in a 24-hour period. 4.  Return to the ER for worsening symptoms, persistent vomiting, difficulty breathing or other concerns.

## 2019-09-22 NOTE — ED Triage Notes (Signed)
Pt presents from home via [facility] with c/o hypertension, headache, and blurred vision. Pt seen here last night for rule out stroke. BP 252/130 for ems. Pt alert and oriented x4 at this time and ambulatory to ems stretcher.

## 2019-09-22 NOTE — ED Provider Notes (Signed)
Patient's third enzyme remains stable.  No significant delta.  She remains chest pain-free no discomfort at this time.  Repeat exam is reassuring.  Do believe she stable and appropriate for further work-up as an outpatient.   Merlyn Lot, MD 09/22/19 [phone]

## 2019-09-22 NOTE — ED Provider Notes (Signed)
[facility] Emergency Department Provider Note   ____________________________________________   First MD Initiated Contact with Patient 09/22/19 [phone]     (approximate)  I have reviewed the triage vital signs and the nursing notes.   HISTORY  Chief Complaint Hypertension    HPI Lindsey [name] is a 83 y.o. female brought to the ED from home via EMS with a chief complaint of hypertension and headache.  Patient has a history of migraine headaches, started on Nurtec this week.  She had been having almost daily severe right-sided headaches for the past 3.5 weeks.  She was seen in the ED on 12/16 for migraine headaches and concerns that she may have had TIA given that she was having some cognitive deficits at the time.  She had an MRI of the brain which was unremarkable and discharged home.  She took her losartan and doxazosin as usual tonight, felt stronger headache and took her blood pressure which is elevated.  MS reports manual BP 252/130.  Without intervention, on arrival her BP is 207/95.  She does have Hydralazine 25mg  with instructions from her cardiologist Dr. Rockey Situ to take if her BP is > 170.  However, she did not take it.  She also has HCTZ 25 mg which she states her PCP took her off because she was having normal blood pressures.  Does say her blood pressure has tended to be labile in nature.  Denies fever, cough, chest pain, shortness of breath, nominal pain, nausea, vomiting, dizziness.  Denies recent trauma or anticoagulant use.      Past Medical History:  Diagnosis Date  . Anemia   . Asthma   . Breast cancer ([place])   . Bronchiectasis ([place]) 06/2007  . Cancer ([place])   . Colitis   . COPD (chronic obstructive pulmonary disease) ([place])   . Depression   . DVT (deep venous thrombosis) ([place])    after her right knee surgery.   . Fibromyalgia   . Fibromyalgia   . Hypertension   . Migraine headache with aura   . Osteoarthritis   . Pneumonia   . Polio  1952    Patient Active Problem List   Diagnosis Date Noted  . SOB (shortness of breath) 04/30/2014  . Leg swelling 04/30/2014  . Bronchiectasis without complication ([place]) [id number]  . History of DVT (deep vein thrombosis) 04/30/2014  . Labile blood pressure 04/30/2014  . S/P IVC filter 04/30/2014  . History of right knee surgery 04/30/2014    Past Surgical History:  Procedure Laterality Date  . ABDOMINAL HYSTERECTOMY    . BACK SURGERY    . BILATERAL TOTAL MASTECTOMY WITH AXILLARY LYMPH NODE DISSECTION    . CARPAL TUNNEL RELEASE     bilateral   . COLONOSCOPY WITH PROPOFOL N/A 08/02/2015   Procedure: COLONOSCOPY WITH PROPOFOL;  Surgeon: Manya Silvas, MD;  Location: [facility] ENDOSCOPY;  Service: Endoscopy;  Laterality: N/A;  . FINGER TENDON REPAIR    . HERNIA REPAIR    . REPLACEMENT TOTAL KNEE     right knee   . TONSILLECTOMY    . UMBILICAL HERNIA REPAIR    . VAGINAL HYSTERECTOMY      Prior to Admission medications   Medication Sig Start Date End Date Taking? Authorizing Provider  albuterol (PROVENTIL HFA;VENTOLIN HFA) 108 (90 Base) MCG/ACT inhaler Inhale into the lungs every 6 (six) hours as needed for wheezing or shortness of breath.    [provider]  budesonide (ENTOCORT EC) 3 MG  24 hr capsule Take by mouth daily. Taking PRN based on diarrhea    [provider]  cephALEXin (KEFLEX) 500 MG capsule Take 1 capsule (500 mg total) by mouth 2 (two) times daily. 09/22/19   Paulette Blanch, MD  cyclobenzaprine (FLEXERIL) 5 MG tablet Take 5 mg by mouth as needed.     [provider]  doxazosin (CARDURA) 4 MG tablet TAKE 1/2 TABLET BY MOUTH TWICE DAILY 12/20/18   Minna Merritts, MD  hydrALAZINE (APRESOLINE) 25 MG tablet Take 1 tablet (25 mg total) by mouth as needed (for BP >170). 04/15/17   Minna Merritts, MD  hydrochlorothiazide (HYDRODIURIL) 25 MG tablet TAKE 1 TABLET BY MOUTH DAILY 12/20/18   Minna Merritts, MD  hydrOXYzine (ATARAX/VISTARIL) 25 MG  tablet Take 25 mg by mouth 3 (three) times daily as needed (Pt. taking 1/2 a pill).    [provider]  losartan (COZAAR) 100 MG tablet TAKE 1 TABLET DAILY 12/20/18   Minna Merritts, MD  Omega-3 Fatty Acids (FISH OIL) 1000 MG CAPS Take 1,000 mg by mouth daily.    [provider]  polyethylene glycol (MIRALAX / GLYCOLAX) packet Take 17 g by mouth daily. Not taking regularly    [provider]  predniSONE (STERAPRED UNI-PAK 21 TAB) 10 MG (21) TBPK tablet Take by mouth as needed.    [provider]  rizatriptan (MAXALT) 10 MG tablet Take 10 mg by mouth as needed for migraine. May repeat in 2 hours if needed    [provider]  SYMBICORT 160-4.5 MCG/ACT inhaler Inhale 2 puffs into the lungs daily.  03/16/17   [provider]  terconazole (TERAZOL 7) 0.4 % vaginal cream Place 1 applicator vaginally at bedtime.    [provider]  venlafaxine (EFFEXOR) 37.5 MG tablet Take 37.5 mg by mouth 1 day or 1 dose.    [provider]    Allergies Ivp dye [iodinated diagnostic agents], Codeine, Sulfa antibiotics, Tegaderm ag mesh [silver], and Tetracyclines & related  Family History  Family history unknown: Yes    Social History Social History   Tobacco Use  . Smoking status: Never Smoker  . Smokeless tobacco: Never Used  Substance Use Topics  . Alcohol use: Yes    Alcohol/week: 1.0 standard drinks    Types: 1 Glasses of wine per week  . Drug use: No    Review of Systems  Constitutional: No fever/chills Eyes: No visual changes. ENT: No sore throat. Cardiovascular: Denies chest pain. Respiratory: Denies shortness of breath. Gastrointestinal: No abdominal pain.  No nausea, no vomiting.  No diarrhea.  No constipation. Genitourinary: Negative for dysuria. Musculoskeletal: Negative for back pain. Skin: Negative for rash. Neurological: Positive for headaches, focal weakness or  numbness.   ____________________________________________   PHYSICAL EXAM:  VITAL SIGNS: ED Triage Vitals  Enc Vitals Group     BP 09/22/19 0101 (!) 207/95     Pulse Rate 09/22/19 0101 75     Resp 09/22/19 0101 14     Temp 09/22/19 0055 98.3 F (36.8 C)     Temp Source 09/22/19 0055 Oral     SpO2 09/22/19 0101 95 %     Weight 09/22/19 0057 119 lb (54 kg)     Height 09/22/19 0057 5\' 2"  (1.575 m)     Head Circumference --      Peak Flow --      Pain Score 09/22/19 0057 6     Pain Loc --  Pain Edu? --      Excl. in [place]? --     Constitutional: Alert and oriented. Well appearing and in no acute distress. Eyes: Conjunctivae are normal. PERRL. EOMI. Head: Atraumatic. Nose: No congestion/rhinnorhea. Mouth/Throat: Mucous membranes are moist.  Oropharynx non-erythematous. Neck: No stridor.  No carotid bruits.  Supple neck without meningismus. Cardiovascular: Normal rate, regular rhythm. Grossly normal heart sounds.  Good peripheral circulation. Respiratory: Normal respiratory effort.  No retractions. Lungs CTAB. Gastrointestinal: Soft and nontender. No distention. No abdominal bruits. No CVA tenderness. Musculoskeletal: No lower extremity tenderness nor edema.  No joint effusions. Neurologic: Alert and oriented x3.  CN II to XII grossly intact.  Normal speech and language. No gross focal neurologic deficits are appreciated.  Skin:  Skin is warm, dry and intact. No rash noted. Psychiatric: Mood and affect are normal. Speech and behavior are normal.  ____________________________________________   LABS (all labs ordered are listed, but only abnormal results are displayed)  Labs Reviewed  COMPREHENSIVE METABOLIC PANEL - Abnormal; Notable for the following components:      Result Value   Sodium 131 (*)    Chloride 93 (*)    Glucose, Bld 114 (*)    AST 54 (*)    All other components within normal limits  URINALYSIS, COMPLETE (UACMP) WITH MICROSCOPIC - Abnormal; Notable for  the following components:   Color, Urine STRAW (*)    APPearance CLEAR (*)    Leukocytes,Ua LARGE (*)    All other components within normal limits  TROPONIN I (HIGH SENSITIVITY) - Abnormal; Notable for the following components:   Troponin I (High Sensitivity) 20 (*)    All other components within normal limits  URINE CULTURE  CBC WITH DIFFERENTIAL/PLATELET  TROPONIN I (HIGH SENSITIVITY)  TROPONIN I (HIGH SENSITIVITY)   ____________________________________________  EKG  ED ECG REPORT I, Neely Cecena J, the attending physician, personally viewed and interpreted this ECG.   Date: 09/22/2019  EKG Time: 0100  Rate: 75  Rhythm: normal EKG, normal sinus rhythm  Axis: LAD  Intervals:left anterior fascicular block  ST&T Change: Nonspecific  ____________________________________________  RADIOLOGY  ED MD interpretation: Mild cardiomegaly  Official radiology report(s): DG Chest Port 1 View  Result Date: 09/22/2019 CLINICAL DATA:  Elevated blood pressure. Headache. EXAM: PORTABLE CHEST 1 VIEW COMPARISON:  Radiograph 04/01/2017 FINDINGS: Mild cardiomegaly. Aortic atherosclerosis. Unchanged mediastinal contours from prior exam. No pulmonary edema, acute airspace disease, pleural effusion or pneumothorax. No acute osseous abnormalities are seen. Kyphoplasty in midthoracic vertebra. IMPRESSION: 1. Mild cardiomegaly.  No acute findings. 2.  Aortic Atherosclerosis (ICD10-I70.0). Electronically Signed   By: Keith Rake M.D.   On: 09/22/2019 01:54    ____________________________________________   PROCEDURES  Procedure(s) performed (including Critical Care):  Procedures   ____________________________________________   INITIAL IMPRESSION / ASSESSMENT AND PLAN / ED COURSE  As part of my medical decision making, I reviewed the following data within the [place] notes reviewed and incorporated, Labs reviewed, EKG interpreted, Radiograph reviewed and Notes  from prior ED visits     T[place] was evaluated in Emergency Department on 09/22/2019 for the symptoms described in the history of present illness. She was evaluated in the context of the global COVID-19 pandemic, which necessitated consideration that the patient might be at risk for infection with the SARS-CoV-2 virus that causes COVID-19. Institutional protocols and algorithms that pertain to the evaluation of patients at risk for COVID-19 are in a state of rapid change based on information  released by regulatory bodies including the CDC and federal and state organizations. These policies and algorithms were followed during the patient's care in the ED.    83 year old female with hypertension who presents to the ED with elevated blood pressure and right-sided headache ongoing for almost 1 month.  Differential diagnosis includes but is not limited to subtherapeutic medication, elevated blood pressure secondary to headache, infection, ACS, etc.  I have personally reviewed patient's ED visit from 12/16 and see that she had a negative CT head and MRI of the brain.  Her neck is supple and she has no focal neurological deficits.  Do not feel patient would benefit from repeat CT imaging.  She is currently not on her HCTZ 25 mg and she did not take hydralazine 25 mg for BP greater than 170.  I suspect adding these back to her current medication regimen would be helpful.  Will trial dose of IV hydralazine, check basic lab work and urinalysis.  Clinical Course as of Sep 22 707  Fri Sep 22, 2019  0241 BP 169/79 after IV Hydralazine. Pending labwork and UA.    [JS]  0430 Will repeat troponin.  Start antibiotic for large leukocytes and 21-50 WBCs.   [JS]  [id number] BP 167/78.  Patient is sleeping soundly after IV Toradol and droperidol for her headache.  Troponin has doubled to 20.  Unable to reassess patient at this time to query if she has been experiencing chest pain, although on initial interview she  had not.  Suspect troponin leak secondary to hypertension.  However, will check a third troponin in 2 hours.   [JS]  [id number] Patient awake, resting in no acute distress.  Feeling significantly better.  BP currently 145/64.  Denies chest pain.  Updated her on plan for repeat troponin and if that is stable, anticipate she will be able to be discharged home on Keflex.  Will also renew her hydralazine prescription as she says she cannot find it.  This was prescribed by her cardiologist to take as needed for SBP>170. Care transferred to Dr. Quentin Cornwall at change of shift. Pending repeat troponin.   [JS]    Clinical Course User Index [JS] Paulette Blanch, MD     ____________________________________________   FINAL CLINICAL IMPRESSION(S) / ED DIAGNOSES  Final diagnoses:  Essential hypertension  Other migraine without status migrainosus, not intractable  Urinary tract infection without hematuria, site unspecified     ED Discharge Orders         Ordered    cephALEXin (KEFLEX) 500 MG capsule  2 times daily     09/22/19 [id number]           Note:  This document was prepared using Dragon voice recognition software and may include unintentional dictation errors.   Paulette Blanch, MD 09/22/19 [phone]

## 2019-09-24 LAB — URINE CULTURE: Culture: 80000 — AB

## 2019-09-25 ENCOUNTER — Other Ambulatory Visit: Payer: Self-pay

## 2019-09-25 ENCOUNTER — Ambulatory Visit (INDEPENDENT_AMBULATORY_CARE_PROVIDER_SITE_OTHER): Payer: PPO | Admitting: Family

## 2019-09-25 ENCOUNTER — Encounter: Payer: Self-pay | Admitting: Family

## 2019-09-25 VITALS — BP 144/80 | HR 71 | Ht 62.0 in | Wt 122.8 lb

## 2019-09-25 DIAGNOSIS — I10 Essential (primary) hypertension: Secondary | ICD-10-CM | POA: Diagnosis not present

## 2019-09-25 DIAGNOSIS — R0989 Other specified symptoms and signs involving the circulatory and respiratory systems: Secondary | ICD-10-CM | POA: Diagnosis not present

## 2019-09-25 DIAGNOSIS — I452 Bifascicular block: Secondary | ICD-10-CM | POA: Diagnosis not present

## 2019-09-25 DIAGNOSIS — M7989 Other specified soft tissue disorders: Secondary | ICD-10-CM

## 2019-09-25 DIAGNOSIS — R42 Dizziness and giddiness: Secondary | ICD-10-CM | POA: Diagnosis not present

## 2019-09-25 DIAGNOSIS — J479 Bronchiectasis, uncomplicated: Secondary | ICD-10-CM | POA: Diagnosis not present

## 2019-09-25 MED ORDER — HYDRALAZINE HCL 25 MG PO TABS: 25.0000 mg | ORAL_TABLET | ORAL | 6 refills | Status: DC | PRN

## 2019-09-25 NOTE — Progress Notes (Signed)
Office Visit    Patient Name: Lindsey [name] Date of Encounter: 09/25/2019  Primary Care Provider:  Tracie Harrier, MD Primary Cardiologist:  Ida Rogue, MD Electrophysiologist:  None   Chief Complaint    Lindsey [name] is a 83 y.o. female with a hx of lower extremity swelling secondary to venous insufficiency, right DVT s/p IVC filter, bronchiectasis on inhalers and nebulizers at home, HTN with labile blood pressure, migraine presents today for elevated blood pressure.  Past Medical History    Past Medical History:  Diagnosis Date  . Anemia   . Asthma   . Breast cancer ([place])   . Bronchiectasis ([place]) 06/2007  . Cancer ([place])   . Colitis   . COPD (chronic obstructive pulmonary disease) ([place])   . Depression   . DVT (deep venous thrombosis) ([place])    after her right knee surgery.   . Fibromyalgia   . Fibromyalgia   . Hypertension   . Migraine headache with aura   . Osteoarthritis   . Pneumonia   . Polio 1952   Past Surgical History:  Procedure Laterality Date  . ABDOMINAL HYSTERECTOMY    . BACK SURGERY    . BILATERAL TOTAL MASTECTOMY WITH AXILLARY LYMPH NODE DISSECTION    . CARPAL TUNNEL RELEASE     bilateral   . COLONOSCOPY WITH PROPOFOL N/A 08/02/2015   Procedure: COLONOSCOPY WITH PROPOFOL;  Surgeon: Manya Silvas, MD;  Location[facility] ENDOSCOPY;  Service: Endoscopy;  Laterality: N/A;  . FINGER TENDON REPAIR    . HERNIA REPAIR    . REPLACEMENT TOTAL KNEE     right knee   . TONSILLECTOMY    . UMBILICAL HERNIA REPAIR    . VAGINAL HYSTERECTOMY     Allergies  Allergies  Allergen Reactions  . Ivp Dye [Iodinated Diagnostic Agents] Anaphylaxis  . Codeine     Dizziness   . Sulfa Antibiotics Hives  . Tegaderm Ag Mesh [Silver]   . Tetracyclines & Related     Stomach upset     History of Present Illness    [name] is a 83 y.o. female with a hx of lower extremity swelling secondary to venous insufficiency, right DVT s/p IVC filter,  bronchiectasis on inhalers and nebulizers at home, HTN with labile blood pressure, migraine last seen 11/22/2018 by Dr. Rockey Situ.  Stress test May 2015 which was reportedly normal.  Number of years ago she had right knee surgery after an accident and fracture.  She suffered DVT and had IVC filter placed.  She has had leg swelling since that time with noted significant varicosities.  09/08/2019 started on Nurtec by her PCP.  ED visit 09/20/2019 for migraine.  There was concern for TIA with some cognitive deficits.  CT head and MRI with no acute findings.  Her BP was noted to be 197/98 in the setting of migraine and 9 out of 10 pain.  Her BP at discharge was 143/58.  Telephone encounter 09/21/2019 -patient was noted to be hypertensive the ED visit for migraine.  BP at home the day 149/79.    Recurrent ED visit 09/22/2019 for hypertension and headache.  BP on arrival to ED 207/95.  She was started on antibiotics for UTI.  Troponin I values 10, 20, 21.  Labs otherwise unremarkable.  Elevated troponin thought to be due to hypertensive urgency.  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  Tells me she went several years without migraines however they have recently  recurred.  She has upcoming visit with a neurologist.  Tells me she still has a migraine today but it is not as bad as it was the other day.  We discussed that hypertension could trigger migraine but also that high blood pressure could be a result of pain.    She wonders if the "TIA "09/19/2018 was concerning to her.  It was in the setting of a severe headache that she had left arm numbness and then called 911.  Her CT and MRI were negative, as above.  Encouraged her to follow-up with neurology.  Checks her blood pressure at home with a wrist cuff.  Her wrist cuff today read 148/85 as compared to manual reading of 144/80.  Her readings at home have been 135-145 over 80s since 09/22/2019.  She tells me she is taking her hydralazine for  blood pressure greater than 170 in the last 6 months but is not certain when she ran out of her prescription.  She reports some dizziness.  When asked to describe it she demonstrates a side to side motion.  No low blood pressure readings.  She reports no chest pain, pressure, tightness.  She reports no SOB nor DOE.  EKGs/Labs/Other Studies Reviewed:   The following studies were reviewed today:  EKG:  EKG is  ordered today.  The ekg ordered today demonstrates sinus rhythm with RBBB and LAFB, voltage criteria for LVH-stable compared to previous.  Recent Labs: 09/22/2019: ALT 28; BUN 20; Creatinine, Ser 0.63; Hemoglobin 13.3; Platelets 214; Potassium 3.8; Sodium 131  Recent Lipid Panel No results found for: CHOL, TRIG, HDL, CHOLHDL, VLDL, LDLCALC, LDLDIRECT  Home Medications   Current Meds  Medication Sig  . albuterol (PROVENTIL HFA;VENTOLIN HFA) 108 (90 Base) MCG/ACT inhaler Inhale into the lungs every 6 (six) hours as needed for wheezing or shortness of breath.  . budesonide (ENTOCORT EC) 3 MG 24 hr capsule Take by mouth daily. Taking PRN based on diarrhea  . cephALEXin (KEFLEX) 500 MG capsule Take 1 capsule (500 mg total) by mouth 2 (two) times daily.  . cyclobenzaprine (FLEXERIL) 5 MG tablet Take 5 mg by mouth as needed.   . doxazosin (CARDURA) 4 MG tablet TAKE 1/2 TABLET BY MOUTH TWICE DAILY  . hydrALAZINE (APRESOLINE) 25 MG tablet Take 1 tablet (25 mg total) by mouth as needed (for BP >170).  . hydrochlorothiazide (HYDRODIURIL) 25 MG tablet TAKE 1 TABLET BY MOUTH DAILY  . hydrOXYzine (ATARAX/VISTARIL) 25 MG tablet Take 25 mg by mouth 3 (three) times daily as needed (Pt. taking 1/2 a pill).  Marland Kitchen losartan (COZAAR) 100 MG tablet TAKE 1 TABLET DAILY  . Omega-3 Fatty Acids (FISH OIL) 1000 MG CAPS Take 1,000 mg by mouth daily.  . polyethylene glycol (MIRALAX / GLYCOLAX) packet Take 17 g by mouth daily. Not taking regularly  . rizatriptan (MAXALT) 10 MG tablet Take 10 mg by mouth as needed  for migraine. May repeat in 2 hours if needed  . SYMBICORT 160-4.5 MCG/ACT inhaler Inhale 2 puffs into the lungs daily.   Marland Kitchen terconazole (TERAZOL 7) 0.4 % vaginal cream Place 1 applicator vaginally at bedtime.  Marland Kitchen venlafaxine (EFFEXOR) 37.5 MG tablet Take 37.5 mg by mouth 1 day or 1 dose.  . [DISCONTINUED] hydrALAZINE (APRESOLINE) 25 MG tablet Take 1 tablet (25 mg total) by mouth as needed (for BP >170).      Review of Systems       Review of Systems  Constitution: Negative for chills, fever and malaise/fatigue.       (+)  headache  Cardiovascular: Negative for chest pain, dyspnea on exertion, leg swelling, near-syncope, orthopnea, palpitations and syncope.  Respiratory: Negative for cough, shortness of breath and wheezing.   Musculoskeletal: Positive for arthritis and joint pain.  Gastrointestinal: Negative for nausea and vomiting.  Neurological: Positive for dizziness. Negative for light-headedness and weakness.   All other systems reviewed and are otherwise negative except as noted above.  Physical Exam    VS:  BP (!) 144/80 (BP Location: Left Arm, Patient Position: Sitting, Cuff Size: Normal)   Pulse 71   Ht 5\' 2"  (1.575 m)   Wt 122 lb 12 oz (55.7 kg)   SpO2 98%   BMI 22.45 kg/m  , BMI Body mass index is 22.45 kg/m. GEN: Well nourished, well developed, in no acute distress. HEENT: normal. Neck: Supple, no JVD, carotid bruits, or masses. Cardiac: RRR, no murmurs, rubs, or gallops. No clubbing, cyanosis, edema.  Radials/DP/PT 2+ and equal bilaterally.  Respiratory:  Respirations regular and unlabored, clear to auscultation bilaterally. GI: Soft, nontender, nondistended, BS + x 4. MS: No deformity or atrophy. Skin: Warm and dry, no rash. Neuro:  Strength and sensation are intact. Psych: Normal affect.  Accessory Clinical Findings    ECG personally reviewed by me today -sinus rhythm, RBBB, LAFB-known bifascicular block-voltage criteria for LVH-stable compared to previous- no  acute changes.  Assessment & Plan    1. HTN - Multiple elevated BP during ED visit in setting of migraine as high as [id number] systolic.  Her HCTZ had previously been discontinued and she was not using her as needed hydralazine.  HCTZ resumed over the weekend.  BP log over the weekend 135-145 over 80s.  Refill as needed hydralazine for SBP greater than 170.  Her blood pressure has been very labile in the past.  Anticipate much of this elevated blood pressure is secondary to pain from migraine and her osteoarthritis.  Hesitant to drastically increase her blood pressure medications as concerned as pain improves her blood pressure will also improve.  She will report SBP consistently >145.   2. Migraine -History of for many years but recently recurred.  Recently started on Nurtec by PCP.  ED visits 12/16 and 12/18 for migraine.  Reports she has a headache today.  Upcoming appointment with neurology.  3. Dizziness - She has longstanding history of dizziness.  Tells me she has had some dizziness over the last week-reports feels as if the room is shifting.  She has had no hypotensive episodes at home, low suspicion orthostatic hypotension.  Reports no near syncope nor syncopal events.  Anticipate this is related to her migraines. Encouraged her to remain well hydrated.   4. ?TIA - Presented to ED 09/20/2019 after her left arm went numb in setting of hypertension and migraine.  CT/MRI of head were without acute findings.  She has upcoming follow-up with neurology.  In the interim she has been asked to start a baby aspirin every day by her PCP.  5. Bronchiectasis without complication -reports breathing is stable.   Follows with pulmonology.  6. Venous insufficiency - No edema on exam today.  Encouraged to continue elevating lower extremities.  7. Bifascicular heart block - Recurrent finding on EKG today.  She denies near syncope, syncopal episodes.  Continue to monitor with annual EKG.  Disposition: Follow up in  2 month(s) with Dr. Rockey Situ or APP.  Loel Dubonnet, NP 09/25/2019, 12:30 PM

## 2019-09-25 NOTE — Patient Instructions (Signed)
Medication Instructions:  - Your physician recommends that you continue on your current medications as directed. Please refer to the Current Medication list given to you today.  *If you need a refill on your cardiac medications before your next appointment, please call your pharmacy*  Lab Work: - none ordered  If you have labs (blood work) drawn today and your tests are completely normal, you will receive your results only by: Marland Kitchen MyChart Message (if you have MyChart) OR . A paper copy in the mail If you have any lab test that is abnormal or we need to change your treatment, we will call you to review the results.  Testing/Procedures: - none ordered  Follow-Up: At [facility], you and your health needs are our priority.  As part of our continuing mission to provide you with exceptional heart care, we have created designated Provider Care Teams.  These Care Teams include your primary Cardiologist (physician) and Advanced Practice Providers (APPs -  Physician Assistants and Nurse Practitioners) who all work together to provide you with the care you need, when you need it.  Your next appointment:   2 month(s)  The format for your next appointment:   In Person  Provider:    You may see Ida Rogue, MD or one of the following Advanced Practice Providers on your designated Care Team:    Murray Hodgkins, NP  Christell Faith, PA-C  Marrianne Mood, PA-C   Other Instructions - Continue to monitor your blood pressure at home. If the top number of your blood pressure is consistently running >/= 145, then call the office to let us know- [phone].

## 2019-09-27 ENCOUNTER — Encounter: Payer: PPO | Admitting: Physical Therapy

## 2019-09-27 ENCOUNTER — Telehealth: Payer: Self-pay | Admitting: Physical Therapy

## 2019-09-27 NOTE — Telephone Encounter (Signed)
Physical Therapist called pt to follow up on her medical work up for her migraine and elevated HTN.Pt updated pt that CT scans / MRI of her brain neg but providers agree that she may have had a TIA. Pt is told to monitor her BP and dizziness.   Discussed discharging from PT at this current time so she can focus on her medical work-up with her team. Recommended pt to perform past PT exercises in bed or seated and avoid the standing/ balance exercises for safety. Pt voiced understanding.  When pt is cleared to return to PT for further balance training, discussed telehealth is the best option due to increased cases of COVID.  Discussed another level of screening for her current migraine/ HTN issues:  Pt has increased risk factors for OSA with compromised cardiopulmonary systems and has not been screened for OSA before. Provided the following research below for pt to read and to f/u with her cardiologist.   https://www.sciencedaily.com/releases/2017/08/170831101454.htm   "But Jun says that the study provides further evidence that sleep apnea isn't just a manifestation of obesity, diabetes and cardiovascular disease -- it can directly aggravate these conditions."   Effect on Asthma   https://bowen-mcdonald.org/ PrepaidPayroll.ca    Cardiovascular  CobrandedAffiliateProgram.com.cy  Migraines   https://www.schultz.biz/

## 2019-10-02 DIAGNOSIS — R4189 Other symptoms and signs involving cognitive functions and awareness: Secondary | ICD-10-CM | POA: Diagnosis not present

## 2019-10-02 DIAGNOSIS — M5030 Other cervical disc degeneration, unspecified cervical region: Secondary | ICD-10-CM | POA: Diagnosis not present

## 2019-10-02 DIAGNOSIS — G43119 Migraine with aura, intractable, without status migrainosus: Secondary | ICD-10-CM | POA: Diagnosis not present

## 2019-10-03 ENCOUNTER — Encounter: Payer: Self-pay | Admitting: Physical Therapy

## 2019-10-03 DIAGNOSIS — Z9181 History of falling: Secondary | ICD-10-CM

## 2019-10-03 DIAGNOSIS — M25552 Pain in left hip: Secondary | ICD-10-CM

## 2019-10-03 DIAGNOSIS — R2689 Other abnormalities of gait and mobility: Secondary | ICD-10-CM

## 2019-10-03 DIAGNOSIS — M25562 Pain in left knee: Secondary | ICD-10-CM

## 2019-10-03 NOTE — Therapy (Signed)
[place] MAIN [facility] SERVICES [address] [place], [place], [address] Phone: [phone]   Fax:  [phone]  Patient Details  Name: Lindsey [name] MRN: [id number] Date of Birth: 1936/08/16 Referring Provider:    Discharge Summary  Reporting  07/24/19 to 09/24/19 ( 5 visits)    Encounter Date: 10/03/2019   Pt needs to be d/c at this time because pt is currently getting worked up for medical issues concerning her migraines and irregularly high BP.   Physical Therapist called pt on 09/27/19  to follow up on her medical work up for her migraine and elevated HTN.Pt updated pt that CT scans / MRI of her brain neg but providers agree that she may have had a TIA. Pt is told to monitor her BP and dizziness.   Discussed discharging from PT at this current time so she can focus on her medical work-up with her team. Recommended pt to perform past PT exercises in bed or seated and avoid the standing/ balance exercises for safety. Pt voiced understanding.   When pt is cleared to return to PT for further balance training, discussed telehealth is the best option due to increased cases of COVID.  Discussed another level of screening for her current migraine/ HTN issues:  Pt has increased risk factors for OSA with compromised cardiopulmonary systems and has not been screened for OSA before. Provided the following research below for pt to read and to f/u with her cardiologist.   https://www.sciencedaily.com/releases/2017/08/170831101454.htm  "But Jun says that the study provides further evidence that sleep apnea isn't just a manifestation of obesity, diabetes and cardiovascular disease -- it can directly aggravate these conditions."   Effect on Asthma  https://bowen-mcdonald.org/ PrepaidPayroll.ca  Cardiovascular  CobrandedAffiliateProgram.com.cy  Migraines    https://www.schultz.biz/   ___________________________________________________________________________________________  Across the past 5 visits, pt has demo'd improved posture and stronger posterior back mm at cervical and thoracic spine along with stronger hip strength for L knee valgus. Pt demo'd IND with scapular/ cervical stabilization exercises and balance exercises. Pt was educated to continue with exercises that she can perform in a stable surface in a chair or in bed while she is still having migraine, HTN issues to minimize risk for falls. Pt voiced understanding. Discussed with pt when she is cleared to return to PT, telehealth PT will be a safer option while COVID cases are increasing. Pt voiced understanding.     PT Long Term Goals - 08/14/19 1442      PT LONG TERM GOAL #1   Title  Pt will demonstrate 5x sit<>stand in less than 15 seconds, demonstrating reduction of fall risk.    Baseline  initial eval- 20 seconds 5xSTS    Time  10    Period  Weeks    Status Unable to assess     PT LONG TERM GOAL #2   Title  Patient will report pain no higher than 4/10 on NPS at L hip and knee over the course of 1 month.    Baseline  8/10 on NPS greater than 30 minutes of walking.    Time  10    Period  Weeks    Status  Unable to assess      PT LONG TERM GOAL #3   Title  Pt will demo stepping strategies in four directions 10/10 trials without ankle strategies/ LOB during manually applied pertubation at waist in order to minimize falls    Time  5    Period  Weeks    Status Unable to assess     PT LONG TERM GOAL #4   Title  Pt will demo less forward head ( earlobe on R to acromium 16 cm, L 17cm to R 16.5 cm and 17.5 cm without cuing for cervical retraction) in order to minimize throacic kyphosis and ower risk for spinal Fx with osteopenia Dx    Time  10    Period  Weeks    Status Unable to assess       PT LONG TERM GOAL #5   Title  Pt will increase her cervical extension endurance test 1 : 39 to >2 min before fatigue in order to improve balance and stability for less falls risk    Time  6    Period  Weeks    Status  Unable to assess                      Jerl Mina ,PT, DPT, E-RYT  10/03/2019, 10:22 AM  [facility] [address] [place], [place], [address] Phone: [phone]   Fax:  [phone]

## 2019-10-04 ENCOUNTER — Ambulatory Visit: Payer: PPO | Admitting: Physical Therapy

## 2019-10-04 DIAGNOSIS — R29898 Other symptoms and signs involving the musculoskeletal system: Secondary | ICD-10-CM | POA: Diagnosis not present

## 2019-10-04 DIAGNOSIS — G459 Transient cerebral ischemic attack, unspecified: Secondary | ICD-10-CM | POA: Diagnosis not present

## 2019-10-04 DIAGNOSIS — I10 Essential (primary) hypertension: Secondary | ICD-10-CM | POA: Diagnosis not present

## 2019-10-04 DIAGNOSIS — I6523 Occlusion and stenosis of bilateral carotid arteries: Secondary | ICD-10-CM | POA: Diagnosis not present

## 2019-10-11 ENCOUNTER — Telehealth: Payer: Self-pay | Admitting: Cardiovascular Disease

## 2019-10-11 DIAGNOSIS — G43119 Migraine with aura, intractable, without status migrainosus: Secondary | ICD-10-CM | POA: Diagnosis not present

## 2019-10-11 MED ORDER — TELMISARTAN 80 MG PO TABS: 80.0000 mg | ORAL_TABLET | Freq: Every day | ORAL | 5 refills | Status: DC

## 2019-10-11 NOTE — Telephone Encounter (Signed)
Has she utilized her PRN Hydralazine for the elevated blood pressures above 170? We discussed and sent refill at her recent office visit.   Please ensure she is taking HCTZ 25mg  daily and Losartan 100mg  daily.   If she is taking medications as prescribed, please discontinue Losartan 100mg  daily and start Telmisartan 80mg  daily. This is a stronger ARB. She may continue to use Hydralazine as needed for SBP >170.  Low number (diastolic) will go up as the top number (systolic) goes up. Her wrist cuff also reads a bit higher than a manual reading (we compared at recent office visit).   Let me know if you have any questions!  Loel Dubonnet, NP

## 2019-10-11 NOTE — Telephone Encounter (Signed)
Pt c/o BP issue: STAT if pt c/o blurred vision, one-sided weakness or slurred speech  1. What are your last 5 BP readings?  1/2 1830 183/113 1/3 2300 178/97 1/4 2000 142/86 1/5 150/98 1/6 1000 130/68  Patient has more readings available to discuss.  2. Are you having any other symptoms (ex. Dizziness, headache, blurred vision, passed out)? Some headaches but when noticed patient takes BP medication and is able to get it under control.  3. What is your BP issue? Patient saw Vella Raring 12/21 and was told to monitor BP.  BP has been fluctuating, sometimes "skyrocketing."  Please call to discuss.

## 2019-10-11 NOTE — Telephone Encounter (Signed)
Pt c/o BP issue: STAT if pt c/o blurred vision, one-sided weakness or slurred speech  1. What are your last 5 BP readings?  1/2 1830 183/113 1/3 2300 178/97 1/4 2000 142/86 1/5 150/98 1/6 1000 130/68  Patient has more readings available to discuss.  2. Are you having any other symptoms (ex. Dizziness, headache, blurred vision, passed out)? Some headaches but when noticed patient takes BP medication and is able to get it under control.  3. What is your BP issue? Patient saw Vella Raring 12/21 and was told to monitor BP.  BP has been fluctuating, sometimes "skyrocketing."  Please call to discuss.   Called and spoke with pt regarding the pt's concerns with her BP. Discussed the above BP readings with the pt and informed her that they were improving since 10/07/19.   Pt states that when she has a HA and she takes her BP meds the HA gets better.  Pt was not aware that high BP can cause her head to hurt.    Pt reports that her concern is that her "bottom" number has been much higher than in the past.  Pt denies chest pain, nausea/vomiting or shortness of breath.    Informed her that I will send this to Owens Shark, NP for review and we will contact her tomorrow 10/12/19 with any recommendations.

## 2019-10-11 NOTE — Telephone Encounter (Signed)
Has she utilized her PRN Hydralazine for the elevated blood pressures above 170? We discussed and sent refill at her recent office visit.   Please ensure she is taking HCTZ 25mg  daily and Losartan 100mg  daily.   If she is taking medications as prescribed, please discontinue Losartan 100mg  daily and start Telmisartan 80mg  daily. This is a stronger ARB. She may continue to use Hydralazine as needed for SBP >170.  Low number (diastolic) will go up as the top number (systolic) goes up. Her wrist cuff also reads a bit higher than a manual reading (we compared at recent office visit).   Let me know if you have any questions!  Loel Dubonnet, NP   Called and spoke with the patient with the above medication changes.  Pt was taking the HCTZ 25mg  daily and the Losartan 100mg  daily.  Pt is also using the Hydralazine as needed.  RX for Telmisartan 80mg  daily was sent to the pharmacy.  Pt verbalized understanding.

## 2019-10-17 DIAGNOSIS — H353211 Exudative age-related macular degeneration, right eye, with active choroidal neovascularization: Secondary | ICD-10-CM | POA: Diagnosis not present

## 2019-10-18 DIAGNOSIS — R41841 Cognitive communication deficit: Secondary | ICD-10-CM | POA: Diagnosis not present

## 2019-10-18 DIAGNOSIS — M6281 Muscle weakness (generalized): Secondary | ICD-10-CM | POA: Diagnosis not present

## 2019-10-18 DIAGNOSIS — M25562 Pain in left knee: Secondary | ICD-10-CM | POA: Diagnosis not present

## 2019-10-18 DIAGNOSIS — Z9181 History of falling: Secondary | ICD-10-CM | POA: Diagnosis not present

## 2019-10-18 DIAGNOSIS — R4701 Aphasia: Secondary | ICD-10-CM | POA: Diagnosis not present

## 2019-10-18 DIAGNOSIS — R2689 Other abnormalities of gait and mobility: Secondary | ICD-10-CM | POA: Diagnosis not present

## 2019-10-19 DIAGNOSIS — I6523 Occlusion and stenosis of bilateral carotid arteries: Secondary | ICD-10-CM | POA: Diagnosis not present

## 2019-10-19 DIAGNOSIS — I10 Essential (primary) hypertension: Secondary | ICD-10-CM | POA: Diagnosis not present

## 2019-10-23 DIAGNOSIS — E871 Hypo-osmolality and hyponatremia: Secondary | ICD-10-CM | POA: Diagnosis not present

## 2019-10-25 DIAGNOSIS — M7061 Trochanteric bursitis, right hip: Secondary | ICD-10-CM | POA: Diagnosis not present

## 2019-10-25 DIAGNOSIS — M25561 Pain in right knee: Secondary | ICD-10-CM | POA: Diagnosis not present

## 2019-10-25 DIAGNOSIS — M25562 Pain in left knee: Secondary | ICD-10-CM | POA: Diagnosis not present

## 2019-10-25 DIAGNOSIS — M7062 Trochanteric bursitis, left hip: Secondary | ICD-10-CM | POA: Diagnosis not present

## 2019-10-26 DIAGNOSIS — R2689 Other abnormalities of gait and mobility: Secondary | ICD-10-CM | POA: Diagnosis not present

## 2019-10-26 DIAGNOSIS — M6281 Muscle weakness (generalized): Secondary | ICD-10-CM | POA: Diagnosis not present

## 2019-10-26 DIAGNOSIS — R4701 Aphasia: Secondary | ICD-10-CM | POA: Diagnosis not present

## 2019-10-26 DIAGNOSIS — R41841 Cognitive communication deficit: Secondary | ICD-10-CM | POA: Diagnosis not present

## 2019-10-26 DIAGNOSIS — Z9181 History of falling: Secondary | ICD-10-CM | POA: Diagnosis not present

## 2019-10-26 DIAGNOSIS — M25562 Pain in left knee: Secondary | ICD-10-CM | POA: Diagnosis not present

## 2019-10-30 DIAGNOSIS — M6281 Muscle weakness (generalized): Secondary | ICD-10-CM | POA: Diagnosis not present

## 2019-10-30 DIAGNOSIS — Z9181 History of falling: Secondary | ICD-10-CM | POA: Diagnosis not present

## 2019-10-30 DIAGNOSIS — M25562 Pain in left knee: Secondary | ICD-10-CM | POA: Diagnosis not present

## 2019-10-30 DIAGNOSIS — R2689 Other abnormalities of gait and mobility: Secondary | ICD-10-CM | POA: Diagnosis not present

## 2019-11-01 DIAGNOSIS — R399 Unspecified symptoms and signs involving the genitourinary system: Secondary | ICD-10-CM | POA: Diagnosis not present

## 2019-11-01 DIAGNOSIS — R2689 Other abnormalities of gait and mobility: Secondary | ICD-10-CM | POA: Diagnosis not present

## 2019-11-01 DIAGNOSIS — R4701 Aphasia: Secondary | ICD-10-CM | POA: Diagnosis not present

## 2019-11-01 DIAGNOSIS — R41841 Cognitive communication deficit: Secondary | ICD-10-CM | POA: Diagnosis not present

## 2019-11-01 DIAGNOSIS — M25562 Pain in left knee: Secondary | ICD-10-CM | POA: Diagnosis not present

## 2019-11-01 DIAGNOSIS — Z9181 History of falling: Secondary | ICD-10-CM | POA: Diagnosis not present

## 2019-11-01 DIAGNOSIS — M6281 Muscle weakness (generalized): Secondary | ICD-10-CM | POA: Diagnosis not present

## 2019-11-01 DIAGNOSIS — R8290 Unspecified abnormal findings in urine: Secondary | ICD-10-CM | POA: Diagnosis not present

## 2019-11-06 DIAGNOSIS — M6281 Muscle weakness (generalized): Secondary | ICD-10-CM | POA: Diagnosis not present

## 2019-11-06 DIAGNOSIS — R2689 Other abnormalities of gait and mobility: Secondary | ICD-10-CM | POA: Diagnosis not present

## 2019-11-06 DIAGNOSIS — R41841 Cognitive communication deficit: Secondary | ICD-10-CM | POA: Diagnosis not present

## 2019-11-06 DIAGNOSIS — R4701 Aphasia: Secondary | ICD-10-CM | POA: Diagnosis not present

## 2019-11-06 DIAGNOSIS — Z9181 History of falling: Secondary | ICD-10-CM | POA: Diagnosis not present

## 2019-11-06 DIAGNOSIS — M25562 Pain in left knee: Secondary | ICD-10-CM | POA: Diagnosis not present

## 2019-11-08 DIAGNOSIS — Z85828 Personal history of other malignant neoplasm of skin: Secondary | ICD-10-CM | POA: Diagnosis not present

## 2019-11-08 DIAGNOSIS — L570 Actinic keratosis: Secondary | ICD-10-CM | POA: Diagnosis not present

## 2019-11-08 DIAGNOSIS — L821 Other seborrheic keratosis: Secondary | ICD-10-CM | POA: Diagnosis not present

## 2019-11-08 DIAGNOSIS — L298 Other pruritus: Secondary | ICD-10-CM | POA: Diagnosis not present

## 2019-11-08 DIAGNOSIS — D485 Neoplasm of uncertain behavior of skin: Secondary | ICD-10-CM | POA: Diagnosis not present

## 2019-11-08 DIAGNOSIS — X32XXXA Exposure to sunlight, initial encounter: Secondary | ICD-10-CM | POA: Diagnosis not present

## 2019-11-08 DIAGNOSIS — C44722 Squamous cell carcinoma of skin of right lower limb, including hip: Secondary | ICD-10-CM | POA: Diagnosis not present

## 2019-11-08 DIAGNOSIS — Z08 Encounter for follow-up examination after completed treatment for malignant neoplasm: Secondary | ICD-10-CM | POA: Diagnosis not present

## 2019-11-13 DIAGNOSIS — M6281 Muscle weakness (generalized): Secondary | ICD-10-CM | POA: Diagnosis not present

## 2019-11-13 DIAGNOSIS — Z9181 History of falling: Secondary | ICD-10-CM | POA: Diagnosis not present

## 2019-11-13 DIAGNOSIS — M25562 Pain in left knee: Secondary | ICD-10-CM | POA: Diagnosis not present

## 2019-11-13 DIAGNOSIS — R4701 Aphasia: Secondary | ICD-10-CM | POA: Diagnosis not present

## 2019-11-13 DIAGNOSIS — R41841 Cognitive communication deficit: Secondary | ICD-10-CM | POA: Diagnosis not present

## 2019-11-13 DIAGNOSIS — R2689 Other abnormalities of gait and mobility: Secondary | ICD-10-CM | POA: Diagnosis not present

## 2019-11-15 DIAGNOSIS — M25562 Pain in left knee: Secondary | ICD-10-CM | POA: Diagnosis not present

## 2019-11-15 DIAGNOSIS — Z9181 History of falling: Secondary | ICD-10-CM | POA: Diagnosis not present

## 2019-11-15 DIAGNOSIS — R2689 Other abnormalities of gait and mobility: Secondary | ICD-10-CM | POA: Diagnosis not present

## 2019-11-15 DIAGNOSIS — M6281 Muscle weakness (generalized): Secondary | ICD-10-CM | POA: Diagnosis not present

## 2019-11-15 DIAGNOSIS — R4701 Aphasia: Secondary | ICD-10-CM | POA: Diagnosis not present

## 2019-11-15 DIAGNOSIS — R41841 Cognitive communication deficit: Secondary | ICD-10-CM | POA: Diagnosis not present

## 2019-11-20 DIAGNOSIS — R1031 Right lower quadrant pain: Secondary | ICD-10-CM | POA: Diagnosis not present

## 2019-11-20 DIAGNOSIS — M25562 Pain in left knee: Secondary | ICD-10-CM | POA: Diagnosis not present

## 2019-11-20 DIAGNOSIS — R1032 Left lower quadrant pain: Secondary | ICD-10-CM | POA: Diagnosis not present

## 2019-11-20 DIAGNOSIS — K5900 Constipation, unspecified: Secondary | ICD-10-CM | POA: Diagnosis not present

## 2019-11-20 DIAGNOSIS — G43119 Migraine with aura, intractable, without status migrainosus: Secondary | ICD-10-CM | POA: Diagnosis not present

## 2019-11-20 DIAGNOSIS — M797 Fibromyalgia: Secondary | ICD-10-CM | POA: Diagnosis not present

## 2019-11-20 DIAGNOSIS — R2689 Other abnormalities of gait and mobility: Secondary | ICD-10-CM | POA: Diagnosis not present

## 2019-11-20 DIAGNOSIS — R29898 Other symptoms and signs involving the musculoskeletal system: Secondary | ICD-10-CM | POA: Diagnosis not present

## 2019-11-20 DIAGNOSIS — K52832 Lymphocytic colitis: Secondary | ICD-10-CM | POA: Diagnosis not present

## 2019-11-20 DIAGNOSIS — F3341 Major depressive disorder, recurrent, in partial remission: Secondary | ICD-10-CM | POA: Diagnosis not present

## 2019-11-20 DIAGNOSIS — N8189 Other female genital prolapse: Secondary | ICD-10-CM | POA: Diagnosis not present

## 2019-11-20 DIAGNOSIS — R519 Headache, unspecified: Secondary | ICD-10-CM | POA: Diagnosis not present

## 2019-11-20 DIAGNOSIS — I10 Essential (primary) hypertension: Secondary | ICD-10-CM | POA: Diagnosis not present

## 2019-11-20 DIAGNOSIS — M25541 Pain in joints of right hand: Secondary | ICD-10-CM | POA: Diagnosis not present

## 2019-11-20 DIAGNOSIS — K649 Unspecified hemorrhoids: Secondary | ICD-10-CM | POA: Diagnosis not present

## 2019-11-20 DIAGNOSIS — G8929 Other chronic pain: Secondary | ICD-10-CM | POA: Diagnosis not present

## 2019-11-20 DIAGNOSIS — R41841 Cognitive communication deficit: Secondary | ICD-10-CM | POA: Diagnosis not present

## 2019-11-20 DIAGNOSIS — R4701 Aphasia: Secondary | ICD-10-CM | POA: Diagnosis not present

## 2019-11-20 DIAGNOSIS — R279 Unspecified lack of coordination: Secondary | ICD-10-CM | POA: Diagnosis not present

## 2019-11-20 DIAGNOSIS — R634 Abnormal weight loss: Secondary | ICD-10-CM | POA: Diagnosis not present

## 2019-11-20 DIAGNOSIS — L299 Pruritus, unspecified: Secondary | ICD-10-CM | POA: Diagnosis not present

## 2019-11-20 DIAGNOSIS — R8290 Unspecified abnormal findings in urine: Secondary | ICD-10-CM | POA: Diagnosis not present

## 2019-11-20 DIAGNOSIS — M25542 Pain in joints of left hand: Secondary | ICD-10-CM | POA: Diagnosis not present

## 2019-11-20 DIAGNOSIS — M6281 Muscle weakness (generalized): Secondary | ICD-10-CM | POA: Diagnosis not present

## 2019-11-20 DIAGNOSIS — Z9181 History of falling: Secondary | ICD-10-CM | POA: Diagnosis not present

## 2019-11-22 ENCOUNTER — Encounter: Payer: Self-pay | Admitting: Cardiovascular Disease

## 2019-11-22 ENCOUNTER — Other Ambulatory Visit: Payer: Self-pay

## 2019-11-22 ENCOUNTER — Ambulatory Visit (INDEPENDENT_AMBULATORY_CARE_PROVIDER_SITE_OTHER): Payer: PPO | Admitting: Cardiovascular Disease

## 2019-11-22 VITALS — BP 124/84 | HR 86 | Ht 62.0 in | Wt 119.8 lb

## 2019-11-22 DIAGNOSIS — R0602 Shortness of breath: Secondary | ICD-10-CM | POA: Diagnosis not present

## 2019-11-22 DIAGNOSIS — I739 Peripheral vascular disease, unspecified: Secondary | ICD-10-CM

## 2019-11-22 DIAGNOSIS — M25541 Pain in joints of right hand: Secondary | ICD-10-CM | POA: Diagnosis not present

## 2019-11-22 DIAGNOSIS — R279 Unspecified lack of coordination: Secondary | ICD-10-CM | POA: Diagnosis not present

## 2019-11-22 DIAGNOSIS — M7989 Other specified soft tissue disorders: Secondary | ICD-10-CM

## 2019-11-22 DIAGNOSIS — Z9181 History of falling: Secondary | ICD-10-CM | POA: Diagnosis not present

## 2019-11-22 DIAGNOSIS — I452 Bifascicular block: Secondary | ICD-10-CM | POA: Diagnosis not present

## 2019-11-22 DIAGNOSIS — I10 Essential (primary) hypertension: Secondary | ICD-10-CM

## 2019-11-22 DIAGNOSIS — M6281 Muscle weakness (generalized): Secondary | ICD-10-CM | POA: Diagnosis not present

## 2019-11-22 DIAGNOSIS — R42 Dizziness and giddiness: Secondary | ICD-10-CM

## 2019-11-22 DIAGNOSIS — R0989 Other specified symptoms and signs involving the circulatory and respiratory systems: Secondary | ICD-10-CM | POA: Diagnosis not present

## 2019-11-22 DIAGNOSIS — I6529 Occlusion and stenosis of unspecified carotid artery: Secondary | ICD-10-CM

## 2019-11-22 DIAGNOSIS — M25562 Pain in left knee: Secondary | ICD-10-CM | POA: Diagnosis not present

## 2019-11-22 DIAGNOSIS — J479 Bronchiectasis, uncomplicated: Secondary | ICD-10-CM | POA: Diagnosis not present

## 2019-11-22 DIAGNOSIS — M25542 Pain in joints of left hand: Secondary | ICD-10-CM | POA: Diagnosis not present

## 2019-11-22 DIAGNOSIS — R2689 Other abnormalities of gait and mobility: Secondary | ICD-10-CM | POA: Diagnosis not present

## 2019-11-22 DIAGNOSIS — R41841 Cognitive communication deficit: Secondary | ICD-10-CM | POA: Diagnosis not present

## 2019-11-22 DIAGNOSIS — R4701 Aphasia: Secondary | ICD-10-CM | POA: Diagnosis not present

## 2019-11-22 MED ORDER — EZETIMIBE 10 MG PO TABS: 10.0000 mg | ORAL_TABLET | Freq: Every day | ORAL | 3 refills | Status: DC

## 2019-11-22 NOTE — Progress Notes (Signed)
Cardiology Office Note  Date:  11/22/2019   ID:  Lindsey [name], Lindsey [name] 07-22-1936, MRN [id number]  PCP:  Tracie Harrier, MD   Chief Complaint  Patient presents with  . office visit    2 month F/U; Meds vebrall yreviewed with patient.    HPI:  Lindsey [name] is a pleasant 84 year old woman with history of  lower extremity swelling,Secondary to venous insufficiency  prior right knee surgery,  right DVT, status post IVC filter,  bronchiectasis on inhalers and nebulizers at home  Back surgery who presents for follow up of her labile blood pressure and her leg swelling.  In the ER 09/2019, two times back to back HTN, migraine Records reviewed for both visits [id number] systolic  Started on norvasc 2.5 mg daily by Dr. Ginette Pitman BP better Dizzy/disoriented at times when she stands up Has not checked her blood pressure when standing  GERD sx, After vaccine had diarrhea Caused a flareup of her colitis  Continue ankle swelling Not using compressions Does not think it is worse after amlodipine started   has carotid disease done through [facility], was told she had moderate disease Records requested  Echocardiogram reviewed from [facility] clinic Mild to moderate MR with normal LV function  Total chol 205, LDL 81 On red yeast rice, discussed with her, she does not want a statin  EKG personally reviewed by myself on todays visit Shows normal sinus rhythm rate 86 bpm right bundle branch block left anterior fascicular block, PACs   Other past medical history reviewed visit to the emergency room ER 6/28,2018 Reported having dizziness and shortness of breath. Presented relatively acutely  Was drinking extra wine ,  stood up she felt dizzy/lightheaded and "breathing heavily".  Symptoms lasted approximately 2 hours  resolved.  Workup in the emergency room was benign Lab work as detailed below discussed with her Sodium 134, potassium 3.2, BUN 21 She does not take potassium supplement on a daily  basis  Leg swelling History dates back many years but started with right knee surgery after an accident, fracture. She suffered a DVT, had IVC filter placed. Swelling worse at the end of the day, better in the morning. She does have significant varicosities.   bronchiectasis diagnosed at outside facility.  stress test may 2015 which was reportedly normal  PMH:   has a past medical history of Anemia, Asthma, Breast cancer ([place]), Bronchiectasis ([place]) (06/2007), Cancer ([place]), Colitis, COPD (chronic obstructive pulmonary disease) ([place]), Depression, DVT (deep venous thrombosis) ([place]), Fibromyalgia, Fibromyalgia, Hypertension, Migraine headache with aura, Osteoarthritis, Osteoporosis, Pneumonia, and Polio (1952).  PSH:    Past Surgical History:  Procedure Laterality Date  . ABDOMINAL HYSTERECTOMY    . BACK SURGERY    . BILATERAL TOTAL MASTECTOMY WITH AXILLARY LYMPH NODE DISSECTION    . CARPAL TUNNEL RELEASE     bilateral   . COLONOSCOPY WITH PROPOFOL N/A 08/02/2015   Procedure: COLONOSCOPY WITH PROPOFOL;  Surgeon: Manya Silvas, MD;  Location: [facility] ENDOSCOPY;  Service: Endoscopy;  Laterality: N/A;  . FINGER TENDON REPAIR    . HERNIA REPAIR    . REPLACEMENT TOTAL KNEE     right knee   . SQUAMOUS CELL CARCINOMA EXCISION    . TONSILLECTOMY    . UMBILICAL HERNIA REPAIR    . VAGINAL HYSTERECTOMY      Current Outpatient Medications  Medication Sig Dispense Refill  . albuterol (PROVENTIL HFA;VENTOLIN HFA) 108 (90 Base) MCG/ACT inhaler Inhale into the lungs every 6 (six) hours as needed for  wheezing or shortness of breath.    . ARIPiprazole (ABILIFY) 5 MG tablet Take 5 mg by mouth daily.    Marland Kitchen aspirin EC 81 MG tablet Take 81 mg by mouth daily.    . budesonide (ENTOCORT EC) 3 MG 24 hr capsule Take by mouth daily. Taking PRN based on diarrhea    . cyclobenzaprine (FLEXERIL) 5 MG tablet Take 5 mg by mouth as needed.     . doxazosin (CARDURA) 4 MG tablet TAKE 1/2 TABLET BY MOUTH TWICE DAILY 90  tablet 3  . hydrALAZINE (APRESOLINE) 25 MG tablet Take 1 tablet (25 mg total) by mouth as needed (for BP >170). 30 tablet 6  . hydrochlorothiazide (HYDRODIURIL) 25 MG tablet TAKE 1 TABLET BY MOUTH DAILY 90 tablet 3  . hydrOXYzine (ATARAX/VISTARIL) 25 MG tablet Take 25 mg by mouth 3 (three) times daily as needed (Pt. taking 1/2 a pill).    . NON FORMULARY Take 2 capsules by mouth in the morning and at bedtime. Algaecal    . NON FORMULARY Take 2 capsules by mouth daily. Strontium Boost    . Omega-3 Fatty Acids (FISH OIL PO) Take 15 mLs by mouth daily. Triple power omega 3    . polyethylene glycol (MIRALAX / GLYCOLAX) packet Take 17 g by mouth daily as needed. Not taking regularly     . Red Yeast Rice Extract (RED YEAST RICE PO) Take 2 capsules by mouth daily.    . Rimegepant Sulfate (NURTEC PO) Take by mouth as needed.    . SYMBICORT 160-4.5 MCG/ACT inhaler Inhale 2 puffs into the lungs daily.     Marland Kitchen terconazole (TERAZOL 7) 0.4 % vaginal cream Place 1 applicator vaginally at bedtime as needed.     . venlafaxine (EFFEXOR) 75 MG tablet Take 75 mg by mouth daily.    Marland Kitchen amLODipine (NORVASC) 2.5 MG tablet Take 1 tablet (2.5 mg total) by mouth daily. 180 tablet 3  . Omega-3 Fatty Acids (FISH OIL) 1000 MG CAPS Take 1,000 mg by mouth daily.    . predniSONE (STERAPRED UNI-PAK 21 TAB) 10 MG (21) TBPK tablet Take by mouth as needed.     No current facility-administered medications for this visit.     Allergies:   Ivp dye [iodinated diagnostic agents], Codeine, Sulfa antibiotics, Tegaderm ag mesh [silver], and Tetracyclines & related   Social History:  The patient  reports that she has never smoked. She has never used smokeless tobacco. She reports current alcohol use of about 1.0 standard drinks of alcohol per week. She reports that she does not use drugs.   Family History:   Family history is unknown by patient.    Review of Systems: Review of Systems  Constitutional: Negative.   Respiratory:  Negative.   Cardiovascular: Negative.   Gastrointestinal: Negative.   Musculoskeletal: Positive for back pain.  Neurological: Positive for headaches.  Psychiatric/Behavioral: Negative.   All other systems reviewed and are negative.    PHYSICAL EXAM: VS:  BP 124/84 (BP Location: Left Arm, Patient Position: Sitting, Cuff Size: Normal)   Pulse 86   Ht 5\' 2"  (1.575 m)   Wt 119 lb 12 oz (54.3 kg)   SpO2 98%   BMI 21.90 kg/m  , BMI Body mass index is 21.9 kg/m.  Constitutional:  oriented to person, place, and time. No distress.  HENT:  Head: Grossly normal Eyes:  no discharge. No scleral icterus.  Neck: No JVD, no carotid bruits  Cardiovascular: Regular rate and rhythm, no murmurs  appreciated  1+ pitting edema around her ankles to lower shin  pulmonary/Chest: Clear to auscultation bilaterally, no wheezes or rails Abdominal: Soft.  no distension.  no tenderness.  Musculoskeletal: Normal range of motion Neurological:  normal muscle tone. Coordination normal. No atrophy Skin: Skin warm and dry Psychiatric: normal affect, pleasant   Recent Labs: 09/22/2019: ALT 28; BUN 20; Creatinine, Ser 0.63; Hemoglobin 13.3; Platelets 214; Potassium 3.8; Sodium 131    Lipid Panel No results found for: CHOL, HDL, LDLCALC, TRIG    Wt Readings from Last 3 Encounters:  11/22/19 119 lb 12 oz (54.3 kg)  09/25/19 122 lb 12 oz (55.7 kg)  09/22/19 119 lb (54 kg)     ASSESSMENT AND PLAN:  Labile blood pressure Recent exacerbation of hypertension in the setting of migraine headache Started on amlodipine 2.5 daily by primary care, numbers well controlled Some symptoms concerning for orthostasis, she will check numbers at home when she has symptoms For acute hypertension she will continue to take hydralazine  History of DVT (deep vein thrombosis) No recurrent episodes,  Discussed with her, she does not like to wear compression hose as a pinch Could try just a ankle compression  device/sleeve  Bronchiectasis without complication (HCC) Breathing stable No recent exacerbations  Leg swelling She does not feel it is worse on amlodipine Recommend leg elevation, a ankle compression sleeve  Orthostasis/dizziness Recommend she take amlodipine at dinnertime rather than morning Monitor blood pressure when she has dizziness when standing For low numbers may need to decrease her medication  PAD/carotid disease LDL above goal, recommended she add Zetia Discussed mechanism, Prescription sent in She does not want a statin   Long discussion with her concerning above  Total encounter time more than 35 minutes  Greater than 50% was spent in counseling and coordination of care with the patient   Disposition:   F/U  12 months   Orders Placed This Encounter  Procedures  . EKG 12-Lead     Signed, Esmond Plants, M.D., Ph.D. 11/22/2019  [place], [place]

## 2019-11-22 NOTE — Patient Instructions (Addendum)
Medication Instructions:  Zetia 10 mg once a day for cholesterol  Take amlodipine to dinner  If you need a refill on your cardiac medications before your next appointment, please call your pharmacy.    Lab work: No new labs needed   If you have labs (blood work) drawn today and your tests are completely normal, you will receive your results only by: Marland Kitchen MyChart Message (if you have MyChart) OR . A paper copy in the mail If you have any lab test that is abnormal or we need to change your treatment, we will call you to review the results.   Testing/Procedures: No new testing needed   Follow-Up: At [facility], you and your health needs are our priority.  As part of our continuing mission to provide you with exceptional heart care, we have created designated Provider Care Teams.  These Care Teams include your primary Cardiologist (physician) and Advanced Practice Providers (APPs -  Physician Assistants and Nurse Practitioners) who all work together to provide you with the care you need, when you need it.  . You will need a follow up appointment in 6 months   . Providers on your designated Care Team:   . Murray Hodgkins, NP . Christell Faith, PA-C . Marrianne Mood, PA-C  Any Other Special Instructions Will Be Listed Below (If Applicable).  For educational health videos Log in to : [email or web address] Or : [email or web address], password : [email or web address]

## 2019-11-23 DIAGNOSIS — C44722 Squamous cell carcinoma of skin of right lower limb, including hip: Secondary | ICD-10-CM | POA: Diagnosis not present

## 2019-11-27 DIAGNOSIS — M25562 Pain in left knee: Secondary | ICD-10-CM | POA: Diagnosis not present

## 2019-11-27 DIAGNOSIS — E663 Overweight: Secondary | ICD-10-CM | POA: Diagnosis not present

## 2019-11-27 DIAGNOSIS — M7061 Trochanteric bursitis, right hip: Secondary | ICD-10-CM | POA: Diagnosis not present

## 2019-11-27 DIAGNOSIS — M1712 Unilateral primary osteoarthritis, left knee: Secondary | ICD-10-CM | POA: Diagnosis not present

## 2019-11-27 DIAGNOSIS — Z9181 History of falling: Secondary | ICD-10-CM | POA: Diagnosis not present

## 2019-11-27 DIAGNOSIS — M25542 Pain in joints of left hand: Secondary | ICD-10-CM | POA: Diagnosis not present

## 2019-11-27 DIAGNOSIS — M7062 Trochanteric bursitis, left hip: Secondary | ICD-10-CM | POA: Diagnosis not present

## 2019-11-27 DIAGNOSIS — G894 Chronic pain syndrome: Secondary | ICD-10-CM | POA: Diagnosis not present

## 2019-11-27 DIAGNOSIS — M25541 Pain in joints of right hand: Secondary | ICD-10-CM | POA: Diagnosis not present

## 2019-11-27 DIAGNOSIS — Z96651 Presence of right artificial knee joint: Secondary | ICD-10-CM | POA: Diagnosis not present

## 2019-11-27 DIAGNOSIS — G8929 Other chronic pain: Secondary | ICD-10-CM | POA: Diagnosis not present

## 2019-11-27 DIAGNOSIS — M797 Fibromyalgia: Secondary | ICD-10-CM | POA: Diagnosis not present

## 2019-11-27 DIAGNOSIS — R279 Unspecified lack of coordination: Secondary | ICD-10-CM | POA: Diagnosis not present

## 2019-11-27 DIAGNOSIS — R2689 Other abnormalities of gait and mobility: Secondary | ICD-10-CM | POA: Diagnosis not present

## 2019-11-27 DIAGNOSIS — M6281 Muscle weakness (generalized): Secondary | ICD-10-CM | POA: Diagnosis not present

## 2019-11-29 DIAGNOSIS — M25562 Pain in left knee: Secondary | ICD-10-CM | POA: Diagnosis not present

## 2019-11-29 DIAGNOSIS — M797 Fibromyalgia: Secondary | ICD-10-CM | POA: Diagnosis not present

## 2019-11-29 DIAGNOSIS — I10 Essential (primary) hypertension: Secondary | ICD-10-CM | POA: Diagnosis not present

## 2019-11-29 DIAGNOSIS — J479 Bronchiectasis, uncomplicated: Secondary | ICD-10-CM | POA: Diagnosis not present

## 2019-11-29 DIAGNOSIS — I6523 Occlusion and stenosis of bilateral carotid arteries: Secondary | ICD-10-CM | POA: Diagnosis not present

## 2019-11-29 DIAGNOSIS — K52832 Lymphocytic colitis: Secondary | ICD-10-CM | POA: Diagnosis not present

## 2019-11-29 DIAGNOSIS — R197 Diarrhea, unspecified: Secondary | ICD-10-CM | POA: Diagnosis not present

## 2019-11-29 DIAGNOSIS — G8929 Other chronic pain: Secondary | ICD-10-CM | POA: Diagnosis not present

## 2019-11-29 DIAGNOSIS — M25552 Pain in left hip: Secondary | ICD-10-CM | POA: Diagnosis not present

## 2019-11-30 DIAGNOSIS — R279 Unspecified lack of coordination: Secondary | ICD-10-CM | POA: Diagnosis not present

## 2019-11-30 DIAGNOSIS — R41841 Cognitive communication deficit: Secondary | ICD-10-CM | POA: Diagnosis not present

## 2019-11-30 DIAGNOSIS — M6281 Muscle weakness (generalized): Secondary | ICD-10-CM | POA: Diagnosis not present

## 2019-11-30 DIAGNOSIS — M25562 Pain in left knee: Secondary | ICD-10-CM | POA: Diagnosis not present

## 2019-11-30 DIAGNOSIS — R4701 Aphasia: Secondary | ICD-10-CM | POA: Diagnosis not present

## 2019-11-30 DIAGNOSIS — Z9181 History of falling: Secondary | ICD-10-CM | POA: Diagnosis not present

## 2019-11-30 DIAGNOSIS — M25541 Pain in joints of right hand: Secondary | ICD-10-CM | POA: Diagnosis not present

## 2019-11-30 DIAGNOSIS — R2689 Other abnormalities of gait and mobility: Secondary | ICD-10-CM | POA: Diagnosis not present

## 2019-11-30 DIAGNOSIS — M25542 Pain in joints of left hand: Secondary | ICD-10-CM | POA: Diagnosis not present

## 2019-12-06 DIAGNOSIS — M25541 Pain in joints of right hand: Secondary | ICD-10-CM | POA: Diagnosis not present

## 2019-12-06 DIAGNOSIS — M25542 Pain in joints of left hand: Secondary | ICD-10-CM | POA: Diagnosis not present

## 2019-12-06 DIAGNOSIS — R2689 Other abnormalities of gait and mobility: Secondary | ICD-10-CM | POA: Diagnosis not present

## 2019-12-06 DIAGNOSIS — Z9181 History of falling: Secondary | ICD-10-CM | POA: Diagnosis not present

## 2019-12-06 DIAGNOSIS — R279 Unspecified lack of coordination: Secondary | ICD-10-CM | POA: Diagnosis not present

## 2019-12-06 DIAGNOSIS — M25562 Pain in left knee: Secondary | ICD-10-CM | POA: Diagnosis not present

## 2019-12-06 DIAGNOSIS — M6281 Muscle weakness (generalized): Secondary | ICD-10-CM | POA: Diagnosis not present

## 2019-12-11 DIAGNOSIS — M25542 Pain in joints of left hand: Secondary | ICD-10-CM | POA: Diagnosis not present

## 2019-12-11 DIAGNOSIS — R279 Unspecified lack of coordination: Secondary | ICD-10-CM | POA: Diagnosis not present

## 2019-12-11 DIAGNOSIS — M25541 Pain in joints of right hand: Secondary | ICD-10-CM | POA: Diagnosis not present

## 2019-12-11 DIAGNOSIS — Z9181 History of falling: Secondary | ICD-10-CM | POA: Diagnosis not present

## 2019-12-11 DIAGNOSIS — M25562 Pain in left knee: Secondary | ICD-10-CM | POA: Diagnosis not present

## 2019-12-11 DIAGNOSIS — M6281 Muscle weakness (generalized): Secondary | ICD-10-CM | POA: Diagnosis not present

## 2019-12-11 DIAGNOSIS — R2689 Other abnormalities of gait and mobility: Secondary | ICD-10-CM | POA: Diagnosis not present

## 2019-12-12 DIAGNOSIS — H353211 Exudative age-related macular degeneration, right eye, with active choroidal neovascularization: Secondary | ICD-10-CM | POA: Diagnosis not present

## 2019-12-13 DIAGNOSIS — M25562 Pain in left knee: Secondary | ICD-10-CM | POA: Diagnosis not present

## 2019-12-13 DIAGNOSIS — R279 Unspecified lack of coordination: Secondary | ICD-10-CM | POA: Diagnosis not present

## 2019-12-13 DIAGNOSIS — M6281 Muscle weakness (generalized): Secondary | ICD-10-CM | POA: Diagnosis not present

## 2019-12-13 DIAGNOSIS — Z9181 History of falling: Secondary | ICD-10-CM | POA: Diagnosis not present

## 2019-12-13 DIAGNOSIS — M25541 Pain in joints of right hand: Secondary | ICD-10-CM | POA: Diagnosis not present

## 2019-12-13 DIAGNOSIS — R2689 Other abnormalities of gait and mobility: Secondary | ICD-10-CM | POA: Diagnosis not present

## 2019-12-13 DIAGNOSIS — M25542 Pain in joints of left hand: Secondary | ICD-10-CM | POA: Diagnosis not present

## 2019-12-18 DIAGNOSIS — R279 Unspecified lack of coordination: Secondary | ICD-10-CM | POA: Diagnosis not present

## 2019-12-18 DIAGNOSIS — M25542 Pain in joints of left hand: Secondary | ICD-10-CM | POA: Diagnosis not present

## 2019-12-18 DIAGNOSIS — M25541 Pain in joints of right hand: Secondary | ICD-10-CM | POA: Diagnosis not present

## 2019-12-18 DIAGNOSIS — M6281 Muscle weakness (generalized): Secondary | ICD-10-CM | POA: Diagnosis not present

## 2019-12-18 DIAGNOSIS — M25562 Pain in left knee: Secondary | ICD-10-CM | POA: Diagnosis not present

## 2019-12-18 DIAGNOSIS — Z9181 History of falling: Secondary | ICD-10-CM | POA: Diagnosis not present

## 2019-12-18 DIAGNOSIS — R2689 Other abnormalities of gait and mobility: Secondary | ICD-10-CM | POA: Diagnosis not present

## 2019-12-25 DIAGNOSIS — M25541 Pain in joints of right hand: Secondary | ICD-10-CM | POA: Diagnosis not present

## 2019-12-25 DIAGNOSIS — M6281 Muscle weakness (generalized): Secondary | ICD-10-CM | POA: Diagnosis not present

## 2019-12-25 DIAGNOSIS — M25542 Pain in joints of left hand: Secondary | ICD-10-CM | POA: Diagnosis not present

## 2019-12-25 DIAGNOSIS — M25562 Pain in left knee: Secondary | ICD-10-CM | POA: Diagnosis not present

## 2019-12-25 DIAGNOSIS — Z9181 History of falling: Secondary | ICD-10-CM | POA: Diagnosis not present

## 2019-12-25 DIAGNOSIS — R279 Unspecified lack of coordination: Secondary | ICD-10-CM | POA: Diagnosis not present

## 2019-12-25 DIAGNOSIS — R2689 Other abnormalities of gait and mobility: Secondary | ICD-10-CM | POA: Diagnosis not present

## 2019-12-27 DIAGNOSIS — M25562 Pain in left knee: Secondary | ICD-10-CM | POA: Diagnosis not present

## 2019-12-27 DIAGNOSIS — R279 Unspecified lack of coordination: Secondary | ICD-10-CM | POA: Diagnosis not present

## 2019-12-27 DIAGNOSIS — Z9181 History of falling: Secondary | ICD-10-CM | POA: Diagnosis not present

## 2019-12-27 DIAGNOSIS — M25542 Pain in joints of left hand: Secondary | ICD-10-CM | POA: Diagnosis not present

## 2019-12-27 DIAGNOSIS — M25541 Pain in joints of right hand: Secondary | ICD-10-CM | POA: Diagnosis not present

## 2019-12-27 DIAGNOSIS — M6281 Muscle weakness (generalized): Secondary | ICD-10-CM | POA: Diagnosis not present

## 2019-12-27 DIAGNOSIS — R2689 Other abnormalities of gait and mobility: Secondary | ICD-10-CM | POA: Diagnosis not present

## 2020-01-01 DIAGNOSIS — R2689 Other abnormalities of gait and mobility: Secondary | ICD-10-CM | POA: Diagnosis not present

## 2020-01-01 DIAGNOSIS — M6281 Muscle weakness (generalized): Secondary | ICD-10-CM | POA: Diagnosis not present

## 2020-01-01 DIAGNOSIS — M25541 Pain in joints of right hand: Secondary | ICD-10-CM | POA: Diagnosis not present

## 2020-01-01 DIAGNOSIS — M25542 Pain in joints of left hand: Secondary | ICD-10-CM | POA: Diagnosis not present

## 2020-01-01 DIAGNOSIS — M25562 Pain in left knee: Secondary | ICD-10-CM | POA: Diagnosis not present

## 2020-01-01 DIAGNOSIS — Z9181 History of falling: Secondary | ICD-10-CM | POA: Diagnosis not present

## 2020-01-01 DIAGNOSIS — R279 Unspecified lack of coordination: Secondary | ICD-10-CM | POA: Diagnosis not present

## 2020-01-03 ENCOUNTER — Observation Stay: Payer: PPO

## 2020-01-03 ENCOUNTER — Observation Stay
Admission: EM | Admit: 2020-01-03 | Discharge: 2020-01-04 | Disposition: A | Discharge status: Home / Self Care | Payer: PPO | Attending: Family Medicine | Admitting: Family Medicine

## 2020-01-03 ENCOUNTER — Other Ambulatory Visit: Payer: Self-pay

## 2020-01-03 ENCOUNTER — Observation Stay
Admit: 2020-01-03 | Discharge: 2020-01-03 | Disposition: A | Discharge status: Home / Self Care | Payer: PPO | Attending: Internal Medicine | Admitting: Internal Medicine

## 2020-01-03 ENCOUNTER — Emergency Department: Payer: PPO

## 2020-01-03 DIAGNOSIS — R42 Dizziness and giddiness: Secondary | ICD-10-CM

## 2020-01-03 DIAGNOSIS — F329 Major depressive disorder, single episode, unspecified: Secondary | ICD-10-CM | POA: Insufficient documentation

## 2020-01-03 DIAGNOSIS — F32A Depression, unspecified: Secondary | ICD-10-CM | POA: Diagnosis present

## 2020-01-03 DIAGNOSIS — Z7952 Long term (current) use of systemic steroids: Secondary | ICD-10-CM | POA: Insufficient documentation

## 2020-01-03 DIAGNOSIS — I083 Combined rheumatic disorders of mitral, aortic and tricuspid valves: Secondary | ICD-10-CM | POA: Insufficient documentation

## 2020-01-03 DIAGNOSIS — G459 Transient cerebral ischemic attack, unspecified: Secondary | ICD-10-CM | POA: Diagnosis present

## 2020-01-03 DIAGNOSIS — Z853 Personal history of malignant neoplasm of breast: Secondary | ICD-10-CM | POA: Insufficient documentation

## 2020-01-03 DIAGNOSIS — G43109 Migraine with aura, not intractable, without status migrainosus: Secondary | ICD-10-CM | POA: Diagnosis present

## 2020-01-03 DIAGNOSIS — Z20822 Contact with and (suspected) exposure to covid-19: Secondary | ICD-10-CM | POA: Insufficient documentation

## 2020-01-03 DIAGNOSIS — R4781 Slurred speech: Secondary | ICD-10-CM | POA: Diagnosis not present

## 2020-01-03 DIAGNOSIS — Z79899 Other long term (current) drug therapy: Secondary | ICD-10-CM | POA: Insufficient documentation

## 2020-01-03 DIAGNOSIS — I63239 Cerebral infarction due to unspecified occlusion or stenosis of unspecified carotid arteries: Principal | ICD-10-CM | POA: Insufficient documentation

## 2020-01-03 DIAGNOSIS — M797 Fibromyalgia: Secondary | ICD-10-CM | POA: Diagnosis not present

## 2020-01-03 DIAGNOSIS — Z888 Allergy status to other drugs, medicaments and biological substances status: Secondary | ICD-10-CM | POA: Insufficient documentation

## 2020-01-03 DIAGNOSIS — Z885 Allergy status to narcotic agent status: Secondary | ICD-10-CM | POA: Diagnosis not present

## 2020-01-03 DIAGNOSIS — Z85828 Personal history of other malignant neoplasm of skin: Secondary | ICD-10-CM | POA: Insufficient documentation

## 2020-01-03 DIAGNOSIS — Z7951 Long term (current) use of inhaled steroids: Secondary | ICD-10-CM | POA: Diagnosis not present

## 2020-01-03 DIAGNOSIS — R479 Unspecified speech disturbances: Secondary | ICD-10-CM

## 2020-01-03 DIAGNOSIS — Z86718 Personal history of other venous thrombosis and embolism: Secondary | ICD-10-CM | POA: Diagnosis not present

## 2020-01-03 DIAGNOSIS — Z882 Allergy status to sulfonamides status: Secondary | ICD-10-CM | POA: Insufficient documentation

## 2020-01-03 DIAGNOSIS — Z03818 Encounter for observation for suspected exposure to other biological agents ruled out: Secondary | ICD-10-CM | POA: Diagnosis not present

## 2020-01-03 DIAGNOSIS — R9082 White matter disease, unspecified: Secondary | ICD-10-CM | POA: Diagnosis not present

## 2020-01-03 DIAGNOSIS — I779 Disorder of arteries and arterioles, unspecified: Secondary | ICD-10-CM

## 2020-01-03 DIAGNOSIS — Z91041 Radiographic dye allergy status: Secondary | ICD-10-CM | POA: Diagnosis not present

## 2020-01-03 DIAGNOSIS — J449 Chronic obstructive pulmonary disease, unspecified: Secondary | ICD-10-CM | POA: Diagnosis present

## 2020-01-03 DIAGNOSIS — E7800 Pure hypercholesterolemia, unspecified: Secondary | ICD-10-CM | POA: Diagnosis not present

## 2020-01-03 DIAGNOSIS — I10 Essential (primary) hypertension: Secondary | ICD-10-CM | POA: Diagnosis present

## 2020-01-03 DIAGNOSIS — I6523 Occlusion and stenosis of bilateral carotid arteries: Secondary | ICD-10-CM | POA: Diagnosis not present

## 2020-01-03 DIAGNOSIS — E876 Hypokalemia: Secondary | ICD-10-CM | POA: Diagnosis not present

## 2020-01-03 DIAGNOSIS — Z881 Allergy status to other antibiotic agents status: Secondary | ICD-10-CM | POA: Insufficient documentation

## 2020-01-03 DIAGNOSIS — Z96651 Presence of right artificial knee joint: Secondary | ICD-10-CM | POA: Insufficient documentation

## 2020-01-03 DIAGNOSIS — Z7982 Long term (current) use of aspirin: Secondary | ICD-10-CM | POA: Insufficient documentation

## 2020-01-03 DIAGNOSIS — G43909 Migraine, unspecified, not intractable, without status migrainosus: Secondary | ICD-10-CM | POA: Diagnosis present

## 2020-01-03 DIAGNOSIS — I6389 Other cerebral infarction: Secondary | ICD-10-CM | POA: Diagnosis not present

## 2020-01-03 LAB — TROPONIN I (HIGH SENSITIVITY)
Troponin I (High Sensitivity): 10 ng/L (ref <18)
Troponin I (High Sensitivity): 11 ng/L (ref <18)

## 2020-01-03 LAB — URINALYSIS, COMPLETE (UACMP) WITH MICROSCOPIC
Bacteria, UA: NONE SEEN
Bilirubin Urine: NEGATIVE
Glucose, UA: NEGATIVE mg/dL
Hgb urine dipstick: NEGATIVE
Ketones, ur: NEGATIVE mg/dL
Nitrite: NEGATIVE
Protein, ur: NEGATIVE mg/dL
Specific Gravity, Urine: 1.006 (ref 1.005–1.030)
pH: 7 (ref 5.0–8.0)

## 2020-01-03 LAB — BASIC METABOLIC PANEL
Anion gap: 10 (ref 5–15)
BUN: 14 mg/dL (ref 8–23)
CO2: 30 mmol/L (ref 22–32)
Calcium: 8.7 mg/dL — ABNORMAL LOW (ref 8.9–10.3)
Chloride: 93 mmol/L — ABNORMAL LOW (ref 98–111)
Creatinine, Ser: 0.64 mg/dL (ref 0.44–1.00)
GFR calc Af Amer: >60 mL/min (ref >60)
GFR calc non Af Amer: >60 mL/min (ref >60)
Glucose, Bld: 121 mg/dL — ABNORMAL HIGH (ref 70–99)
Potassium: 3.3 mmol/L — ABNORMAL LOW (ref 3.5–5.1)
Sodium: 133 mmol/L — ABNORMAL LOW (ref 135–145)

## 2020-01-03 LAB — RESPIRATORY PANEL BY RT PCR (FLU A&B, COVID)
Influenza A by PCR: NEGATIVE
Influenza B by PCR: NEGATIVE
SARS Coronavirus 2 by RT PCR: NEGATIVE

## 2020-01-03 LAB — CBC
HCT: 42.7 % (ref 36.0–46.0)
Hemoglobin: 14 g/dL (ref 12.0–15.0)
MCH: 30 pg (ref 26.0–34.0)
MCHC: 32.8 g/dL (ref 30.0–36.0)
MCV: 91.6 fL (ref 80.0–100.0)
Platelets: 230 10*3/uL (ref 150–400)
RBC: 4.66 MIL/uL (ref 3.87–5.11)
RDW: 13.3 % (ref 11.5–15.5)
WBC: 5.9 10*3/uL (ref 4.0–10.5)
nRBC: 0 % (ref 0.0–0.2)

## 2020-01-03 LAB — ECHOCARDIOGRAM COMPLETE
Height: 62 in
Weight: 1872 oz

## 2020-01-03 MED ORDER — OMEGA-3-ACID ETHYL ESTERS 1 G PO CAPS
1000.0000 mg | ORAL_CAPSULE | Freq: Every day | ORAL | Status: DC
  Administered 2020-01-03 – 2020-01-04 (×2): 1000 mg via ORAL
  Filled 2020-01-03 (×2): qty 1

## 2020-01-03 MED ORDER — HYDROXYZINE HCL 25 MG PO TABS
12.5000 mg | ORAL_TABLET | Freq: Three times a day (TID) | ORAL | Status: DC | PRN
  Filled 2020-01-03: qty 1

## 2020-01-03 MED ORDER — STROKE: EARLY STAGES OF RECOVERY BOOK: Freq: Once | Status: AC

## 2020-01-03 MED ORDER — LABETALOL HCL 5 MG/ML IV SOLN: 10.0000 mg | Freq: Four times a day (QID) | INTRAVENOUS | Status: DC | PRN

## 2020-01-03 MED ORDER — SUMATRIPTAN SUCCINATE 50 MG PO TABS
50.0000 mg | ORAL_TABLET | ORAL | Status: DC | PRN
  Filled 2020-01-03: qty 1

## 2020-01-03 MED ORDER — DOXAZOSIN MESYLATE 2 MG PO TABS: 2.0000 mg | ORAL_TABLET | Freq: Two times a day (BID) | ORAL | Status: DC

## 2020-01-03 MED ORDER — ACETAMINOPHEN 160 MG/5ML PO SOLN
650.0000 mg | ORAL | Status: DC | PRN
  Filled 2020-01-03: qty 20.3

## 2020-01-03 MED ORDER — VENLAFAXINE HCL ER 75 MG PO CP24
75.0000 mg | ORAL_CAPSULE | Freq: Every day | ORAL | Status: DC
  Administered 2020-01-03 – 2020-01-04 (×2): 75 mg via ORAL
  Filled 2020-01-03 (×2): qty 1

## 2020-01-03 MED ORDER — MOMETASONE FURO-FORMOTEROL FUM 200-5 MCG/ACT IN AERO
2.0000 | INHALATION_SPRAY | Freq: Two times a day (BID) | RESPIRATORY_TRACT | Status: DC
  Administered 2020-01-03: 2 via RESPIRATORY_TRACT
  Filled 2020-01-03: qty 8.8

## 2020-01-03 MED ORDER — ARIPIPRAZOLE 5 MG PO TABS
5.0000 mg | ORAL_TABLET | Freq: Every day | ORAL | Status: DC
  Administered 2020-01-04: 12:00:00 5 mg via ORAL
  Filled 2020-01-03: qty 1

## 2020-01-03 MED ORDER — ACETAMINOPHEN 325 MG PO TABS: 650.0000 mg | ORAL_TABLET | ORAL | Status: DC | PRN

## 2020-01-03 MED ORDER — ACETAMINOPHEN 650 MG RE SUPP: 650.0000 mg | RECTAL | Status: DC | PRN

## 2020-01-03 MED ORDER — BUDESONIDE 3 MG PO CPEP
3.0000 mg | ORAL_CAPSULE | Freq: Every day | ORAL | Status: DC
  Administered 2020-01-03 – 2020-01-04 (×2): 3 mg via ORAL
  Filled 2020-01-03 (×2): qty 1

## 2020-01-03 MED ORDER — SODIUM CHLORIDE 0.9 % IV SOLN: Freq: Once | INTRAVENOUS | Status: AC

## 2020-01-03 MED ORDER — EZETIMIBE 10 MG PO TABS
10.0000 mg | ORAL_TABLET | Freq: Every day | ORAL | Status: DC
  Administered 2020-01-03 – 2020-01-04 (×2): 10 mg via ORAL
  Filled 2020-01-03 (×2): qty 1

## 2020-01-03 MED ORDER — POTASSIUM CHLORIDE CRYS ER 20 MEQ PO TBCR
40.0000 meq | EXTENDED_RELEASE_TABLET | Freq: Once | ORAL | Status: AC
  Administered 2020-01-03: 22:00:00 40 meq via ORAL
  Filled 2020-01-03: qty 2

## 2020-01-03 MED ORDER — ASPIRIN EC 325 MG PO TBEC
325.0000 mg | DELAYED_RELEASE_TABLET | Freq: Every day | ORAL | Status: DC
  Administered 2020-01-03: 325 mg via ORAL
  Filled 2020-01-03: qty 1

## 2020-01-03 MED ORDER — CYCLOBENZAPRINE HCL 10 MG PO TABS: 5.0000 mg | ORAL_TABLET | Freq: Three times a day (TID) | ORAL | Status: DC | PRN

## 2020-01-03 NOTE — ED Notes (Signed)
EDP at bedside  

## 2020-01-03 NOTE — ED Notes (Signed)
Neurologist in with pt now

## 2020-01-03 NOTE — ED Notes (Signed)
Up to bathroom to attempt to given urine sample, unable to do so at this time. Ambulates appropriately.

## 2020-01-03 NOTE — ED Notes (Signed)
Full rainbow sent to lab.  

## 2020-01-03 NOTE — ED Notes (Signed)
Report called to [facility] rn floor nurse

## 2020-01-03 NOTE — ED Provider Notes (Signed)
[facility] Emergency Department Provider Note       Time seen: ----------------------------------------- 3:10 PM on 01/03/2020 -----------------------------------------   I have reviewed the triage vital signs and the nursing notes.  HISTORY   Chief Complaint Dizziness   HPI Lindsey [name] is a 84 y.o. female with a history of anemia, asthma, colitis, COPD, DVT, fibromyalgia, migraine who presents to the ED for dizziness that she explains is weakness that has been off and on for a few days.  Patient states beginning around 56 PM she was unable to speak and find her words correctly.  She has been taking her medications as prescribed.  She denies headache, numbness, tingling or specific weakness.  Past Medical History:  Diagnosis Date  . Anemia   . Asthma   . Breast cancer ([place])   . Bronchiectasis ([place]) 06/2007  . Cancer ([place])   . Colitis   . COPD (chronic obstructive pulmonary disease) ([place])   . Depression   . DVT (deep venous thrombosis) ([place])    after her right knee surgery.   . Fibromyalgia   . Fibromyalgia   . Hypertension   . Migraine headache with aura   . Osteoarthritis   . Osteoporosis   . Pneumonia   . Polio 1952    Patient Active Problem List   Diagnosis Date Noted  . SOB (shortness of breath) 04/30/2014  . Leg swelling 04/30/2014  . Bronchiectasis without complication ([place]) [id number]  . History of DVT (deep vein thrombosis) 04/30/2014  . Labile blood pressure 04/30/2014  . S/P IVC filter 04/30/2014  . History of right knee surgery 04/30/2014    Past Surgical History:  Procedure Laterality Date  . ABDOMINAL HYSTERECTOMY    . BACK SURGERY    . BILATERAL TOTAL MASTECTOMY WITH AXILLARY LYMPH NODE DISSECTION    . CARPAL TUNNEL RELEASE     bilateral   . COLONOSCOPY WITH PROPOFOL N/A 08/02/2015   Procedure: COLONOSCOPY WITH PROPOFOL;  Surgeon: Manya Silvas, MD;  Location: [facility] ENDOSCOPY;  Service: Endoscopy;  Laterality:  N/A;  . FINGER TENDON REPAIR    . HERNIA REPAIR    . REPLACEMENT TOTAL KNEE     right knee   . SQUAMOUS CELL CARCINOMA EXCISION    . TONSILLECTOMY    . UMBILICAL HERNIA REPAIR    . VAGINAL HYSTERECTOMY      Allergies Ivp dye [iodinated diagnostic agents], Codeine, Sulfa antibiotics, Tegaderm ag mesh [silver], and Tetracyclines & related  Social History Social History   Tobacco Use  . Smoking status: Never Smoker  . Smokeless tobacco: Never Used  Substance Use Topics  . Alcohol use: Yes    Alcohol/week: 1.0 standard drinks    Types: 1 Glasses of wine per week  . Drug use: No    Review of Systems Constitutional: Negative for fever. Cardiovascular: Negative for chest pain. Respiratory: Negative for shortness of breath. Gastrointestinal: Negative for abdominal pain, vomiting and diarrhea. Musculoskeletal: Negative for back pain. Skin: Negative for rash. Neurological: Positive for generalized weakness, positive for difficulty speaking  All systems negative/normal/unremarkable except as stated in the HPI  ____________________________________________   PHYSICAL EXAM:  VITAL SIGNS: ED Triage Vitals  Enc Vitals Group     BP 01/03/20 1320 132/67     Pulse Rate 01/03/20 1320 67     Resp 01/03/20 1320 18     Temp 01/03/20 1320 97.8 F (36.6 C)     Temp Source 01/03/20 1320 Oral  SpO2 01/03/20 1320 98 %     Weight 01/03/20 1318 117 lb (53.1 kg)     Height 01/03/20 1318 5\' 2"  (1.575 m)     Head Circumference --      Peak Flow --      Pain Score 01/03/20 1318 0     Pain Loc --      Pain Edu? --      Excl. in [place]? --     Constitutional: Alert and oriented. Well appearing and in no distress. Eyes: Conjunctivae are normal. Normal extraocular movements. Cardiovascular: Normal rate, regular rhythm. No murmurs, rubs, or gallops. Respiratory: Normal respiratory effort without tachypnea nor retractions. Breath sounds are clear and equal bilaterally. No  wheezes/rales/rhonchi. Gastrointestinal: Soft and nontender. Normal bowel sounds Musculoskeletal: Nontender with normal range of motion in extremities. No lower extremity tenderness nor edema. Neurologic: Patient has dysarthria at times and has difficulty pronouncing specific words followed by periods of fluency.  No specific aphasia is noted.    Strength, sensation, cranial nerves otherwise normal. Skin:  Skin is warm, dry and intact. No rash noted. Psychiatric: Mood and affect are normal. Speech and behavior are normal.  ____________________________________________  EKG: Interpreted by me.  Sinus rhythm with PACs, right bundle branch block, left anterior fascicular block, possible septal infarct age-indeterminate  ____________________________________________  ED COURSE:  As part of my medical decision making, I reviewed the following data within the [place] History obtained from family if available, nursing notes, old chart and ekg, as well as notes from prior ED visits. Patient presented for weakness and acute speech difficulty, we will assess with labs and imaging as indicated at this time.   Procedures  [name] was evaluated in Emergency Department on 01/03/2020 for the symptoms described in the history of present illness. She was evaluated in the context of the global COVID-19 pandemic, which necessitated consideration that the patient might be at risk for infection with the SARS-CoV-2 virus that causes COVID-19. Institutional protocols and algorithms that pertain to the evaluation of patients at risk for COVID-19 are in a state of rapid change based on information released by regulatory bodies including the CDC and federal and state organizations. These policies and algorithms were followed during the patient's care in the ED.  ____________________________________________   LABS (pertinent positives/negatives)  Labs Reviewed  BASIC METABOLIC PANEL -  Abnormal; Notable for the following components:      Result Value   Sodium 133 (*)    Potassium 3.3 (*)    Chloride 93 (*)    Glucose, Bld 121 (*)    Calcium 8.7 (*)    All other components within normal limits  URINALYSIS, COMPLETE (UACMP) WITH MICROSCOPIC - Abnormal; Notable for the following components:   Color, Urine YELLOW (*)    APPearance CLEAR (*)    Leukocytes,Ua SMALL (*)    All other components within normal limits  RESPIRATORY PANEL BY RT PCR (FLU A&B, COVID)  CBC  HEMOGLOBIN A1C  LIPID PANEL  TROPONIN I (HIGH SENSITIVITY)  TROPONIN I (HIGH SENSITIVITY)    RADIOLOGY Images were viewed by me  CT head IMPRESSION:  No acute intracranial pathology. Small-vessel white matter disease.  ____________________________________________   DIFFERENTIAL DIAGNOSIS   CVA, TIA, dehydration, electrolyte abnormality, complex migraine  FINAL ASSESSMENT AND PLAN  Dysarthria, weakness   Plan: The patient had presented for weakness and acute speech difficulty. Patient's labs were grossly unremarkable. Patient's imaging regarding CT head was unremarkable.  Patient  does have some acute speech difficulty and was evaluated by neurology.  Patient will need to be admitted with MRI.   Laurence Aly, MD    Note: This note was generated in part or whole with voice recognition software. Voice recognition is usually quite accurate but there are transcription errors that can and very often do occur. I apologize for any typographical errors that were not detected and corrected.     Earleen Newport, MD 01/03/20 [id number]

## 2020-01-03 NOTE — H&P (Signed)
History and Physical    Lindsey [name] [id number] DOB: 03-30-1936 DOA: 01/03/2020  PCP: Tracie Harrier, MD   Patient coming from: Home  I have personally briefly reviewed patient's old medical records in [place]  Chief Complaint: Dizziness  HPI: Lindsey [name] is a 84 y.o. female with medical history significant for colitis, COPD, fibromyalgia, migraine headaches who presented to the emergency room via EMS for evaluation of word finding difficulty started about noon.  She was seen in the ER at about 3 PM her symptoms had improved but not completely resolved.  She has a history of migraine headaches with aura and had an episode similar to her usual aura (blurred vision) prior to developing word finding difficulties.  She states that her neurologist had recently switched her migraine medications and that she has no relief with this new agent.  She denies having any difficulty swallowing, no focal deficits.  She complains of dizziness but has had no falls or loss of consciousness.  Denies having any chest pain, no shortness of breath, no nausea, vomiting, diaphoresis or palpitations.  ED Course: Patient presented to the emergency room for evaluation of word finding difficulties which started at about noon.  Symptoms have improved but not completely resolved. She will be referred to observation status for further evaluation  Review of Systems: As per HPI otherwise 10 point review of systems negative.     Past Medical History:  Diagnosis Date  . Anemia   . Asthma   . Breast cancer ([place])   . Bronchiectasis ([place]) 06/2007  . Cancer ([place])   . Colitis   . COPD (chronic obstructive pulmonary disease) ([place])   . Depression   . DVT (deep venous thrombosis) ([place])    after her right knee surgery.   . Fibromyalgia   . Fibromyalgia   . Hypertension   . Migraine headache with aura   . Osteoarthritis   . Osteoporosis   . Pneumonia   . Polio 1952    Past Surgical History:    Procedure Laterality Date  . ABDOMINAL HYSTERECTOMY    . BACK SURGERY    . BILATERAL TOTAL MASTECTOMY WITH AXILLARY LYMPH NODE DISSECTION    . CARPAL TUNNEL RELEASE     bilateral   . COLONOSCOPY WITH PROPOFOL N/A 08/02/2015   Procedure: COLONOSCOPY WITH PROPOFOL;  Surgeon: Manya Silvas, MD;  Location: [facility] ENDOSCOPY;  Service: Endoscopy;  Laterality: N/A;  . FINGER TENDON REPAIR    . HERNIA REPAIR    . REPLACEMENT TOTAL KNEE     right knee   . SQUAMOUS CELL CARCINOMA EXCISION    . TONSILLECTOMY    . UMBILICAL HERNIA REPAIR    . VAGINAL HYSTERECTOMY       reports that she has never smoked. She has never used smokeless tobacco. She reports current alcohol use of about 1.0 standard drinks of alcohol per week. She reports that she does not use drugs.  Allergies  Allergen Reactions  . Ivp Dye [Iodinated Diagnostic Agents] Anaphylaxis  . Codeine     Dizziness   . Sulfa Antibiotics Hives  . Tegaderm Ag Mesh [Silver]   . Tetracyclines & Related     Stomach upset     Family History  Family history unknown: Yes     Prior to Admission medications   Medication Sig Start Date End Date Taking? Authorizing Provider  albuterol (PROVENTIL HFA;VENTOLIN HFA) 108 (90 Base) MCG/ACT inhaler Inhale into the lungs every 6 (six) hours  as needed for wheezing or shortness of breath.    [provider]  amLODipine (NORVASC) 2.5 MG tablet Take 1 tablet (2.5 mg total) by mouth daily. 11/22/19   Minna Merritts, MD  ARIPiprazole (ABILIFY) 5 MG tablet Take 5 mg by mouth daily.    [provider]  aspirin EC 81 MG tablet Take 81 mg by mouth daily.    [provider]  budesonide (ENTOCORT EC) 3 MG 24 hr capsule Take by mouth daily. Taking PRN based on diarrhea    [provider]  cyclobenzaprine (FLEXERIL) 5 MG tablet Take 5 mg by mouth as needed.     [provider]  doxazosin (CARDURA) 4 MG tablet TAKE 1/2 TABLET BY MOUTH TWICE DAILY 12/20/18   Minna Merritts, MD  ezetimibe (ZETIA) 10 MG tablet Take 1 tablet (10 mg total) by mouth daily. 11/22/19   Minna Merritts, MD  hydrALAZINE (APRESOLINE) 25 MG tablet Take 1 tablet (25 mg total) by mouth as needed (for BP >170). 09/25/19   Loel Dubonnet, NP  hydrochlorothiazide (HYDRODIURIL) 25 MG tablet TAKE 1 TABLET BY MOUTH DAILY 12/20/18   Minna Merritts, MD  hydrOXYzine (ATARAX/VISTARIL) 25 MG tablet Take 25 mg by mouth 3 (three) times daily as needed (Pt. taking 1/2 a pill).    [provider]  NON FORMULARY Take 2 capsules by mouth in the morning and at bedtime. Algaecal    [provider]  NON FORMULARY Take 2 capsules by mouth daily. Strontium Boost    [provider]  Omega-3 Fatty Acids (FISH OIL PO) Take 15 mLs by mouth daily. Triple power omega 3    [provider]  Omega-3 Fatty Acids (FISH OIL) 1000 MG CAPS Take 1,000 mg by mouth daily.    [provider]  polyethylene glycol (MIRALAX / GLYCOLAX) packet Take 17 g by mouth daily as needed. Not taking regularly     [provider]  Red Yeast Rice Extract (RED YEAST RICE PO) Take 2 capsules by mouth daily.    [provider]  Rimegepant Sulfate (NURTEC PO) Take by mouth as needed.    [provider]  SYMBICORT 160-4.5 MCG/ACT inhaler Inhale 2 puffs into the lungs daily.  03/16/17   [provider]  terconazole (TERAZOL 7) 0.4 % vaginal cream Place 1 applicator vaginally at bedtime as needed.     [provider]  venlafaxine (EFFEXOR) 75 MG tablet Take 75 mg by mouth daily.    [provider]    Physical Exam: Vitals:   01/03/20 1320 01/03/20 1530 01/03/20 1545 01/03/20 1636  BP: 132/67 (!) 175/90    Pulse: 67 65 63 62  Resp: 18 17 14 16   Temp: 97.8 F (36.6 C)     TempSrc: Oral     SpO2: 98% 96% 99% 99%  Weight:      Height:         Vitals:   01/03/20 1320 01/03/20 1530 01/03/20 1545 01/03/20 1636  BP: 132/67 (!) 175/90      Pulse: 67 65 63 62  Resp: 18 17 14 16   Temp: 97.8 F (36.6 C)     TempSrc: Oral     SpO2: 98% 96% 99% 99%  Weight:      Height:        Constitutional: NAD, alert and oriented x 3 Eyes: PERRL, lids and conjunctivae normal ENMT: Mucous membranes are moist.  Neck: normal, supple, no masses,  no thyromegaly Respiratory: clear to auscultation bilaterally, no wheezing, no crackles. Normal respiratory effort. No accessory muscle use.  Cardiovascular: Regular rate and rhythm, no murmurs / rubs / gallops. No extremity edema. 2+ pedal pulses. No carotid bruits.  Abdomen: no tenderness, no masses palpated. No hepatosplenomegaly. Bowel sounds positive.  Musculoskeletal: no clubbing / cyanosis. No joint deformity upper and lower extremities.  Skin: no rashes, lesions, ulcers.  Neurologic: No gross focal neurologic deficit. Able to move all extremities Psychiatric: Normal mood and affect.   Labs on Admission: I have personally reviewed following labs and imaging studies  CBC: Recent Labs  Lab 01/03/20 1326  WBC 5.9  HGB 14.0  HCT 42.7  MCV 91.6  PLT 123456   Basic Metabolic Panel: Recent Labs  Lab 01/03/20 1326  NA 133*  K 3.3*  CL 93*  CO2 30  GLUCOSE 121*  BUN 14  CREATININE 0.64  CALCIUM 8.7*   GFR: Estimated Creatinine Clearance: 43.6 mL/min (by C-G formula based on SCr of 0.64 mg/dL). Liver Function Tests: No results for input(s): AST, ALT, ALKPHOS, BILITOT, PROT, ALBUMIN in the last 168 hours. No results for input(s): LIPASE, AMYLASE in the last 168 hours. No results for input(s): AMMONIA in the last 168 hours. Coagulation Profile: No results for input(s): INR, PROTIME in the last 168 hours. Cardiac Enzymes: No results for input(s): CKTOTAL, CKMB, CKMBINDEX, TROPONINI in the last 168 hours. BNP (last 3 results) No results for input(s): PROBNP in the last 8760 hours. HbA1C: No results for input(s): HGBA1C in the last 72 hours. CBG: No results for input(s): GLUCAP  in the last 168 hours. Lipid Profile: No results for input(s): CHOL, HDL, LDLCALC, TRIG, CHOLHDL, LDLDIRECT in the last 72 hours. Thyroid Function Tests: No results for input(s): TSH, T4TOTAL, FREET4, T3FREE, THYROIDAB in the last 72 hours. Anemia Panel: No results for input(s): VITAMINB12, FOLATE, FERRITIN, TIBC, IRON, RETICCTPCT in the last 72 hours. Urine analysis:    Component Value Date/Time   COLORURINE YELLOW (A) 01/03/2020 1428   APPEARANCEUR CLEAR (A) 01/03/2020 1428   LABSPEC 1.006 01/03/2020 1428   PHURINE 7.0 01/03/2020 1428   GLUCOSEU NEGATIVE 01/03/2020 1428   HGBUR NEGATIVE 01/03/2020 1428   BILIRUBINUR NEGATIVE 01/03/2020 1428   KETONESUR NEGATIVE 01/03/2020 1428   PROTEINUR NEGATIVE 01/03/2020 1428   NITRITE NEGATIVE 01/03/2020 1428   LEUKOCYTESUR SMALL (A) 01/03/2020 1428    Radiological Exams on Admission: CT Head Wo Contrast  Result Date: 01/03/2020 CLINICAL DATA:  Speech difficulty, dizziness EXAM: CT HEAD WITHOUT CONTRAST TECHNIQUE: Contiguous axial images were obtained from the base of the skull through the vertex without intravenous contrast. COMPARISON:  09/20/2019 FINDINGS: Brain: No evidence of acute infarction, hemorrhage, hydrocephalus, extra-axial collection or mass lesion/mass effect. Periventricular and deep white matter hypodensity. Vascular: No hyperdense vessel or unexpected calcification. Skull: Normal. Negative for fracture or focal lesion. Sinuses/Orbits: No acute finding. Other: None. IMPRESSION: No acute intracranial pathology.  Small-vessel white matter disease. Electronically Signed   By: Eddie Candle M.D.   On: 01/03/2020 16:27    EKG: Independently reviewed.  Sinus rhythm with PACs, right bundle branch block, left anterior fascicular block, possible septal infarct age-indeterminate   Assessment/Plan Principal Problem:   TIA (transient ischemic attack) Active Problems:   Migraine headache with aura   Essential hypertension    Depression   COPD with chronic bronchitis (HCC)   Hypokalemia     TIA Rule out acute CVA Initial CT scan of the head without contrast is negative  for bleed We will obtain MRI of the brain to rule out an acute infarct Continue Aspirin 325mg  daily and statins Allow for permissive hypertension until an acute stroke is ruled out Will consult neurology Obtain 2D echocardiogram and carotid Doppler   Migraine headache with aura Place patient on sumatriptan   Essential hypertension Allow for permissive hypertension until an acute stroke is ruled out    Depression Continue venlafaxine and Abilify    COPD Not acutely exacerbated Continue inhaled steroids and as needed bronchodilator therapy    Hypokalemia Supplement potassium  DVT prophylaxis: SCD Code Status: Full Family Communication: Plan of care was discussed with patient in detail, she verbalizes understanding and agrees with the plan Disposition Plan: Back to previous home environment Consults called: Neurology    Collier Bullock MD [facility]     01/03/2020, 5:50 PM

## 2020-01-03 NOTE — Consult Note (Signed)
Requesting Physician: Agbata    Chief Complaint: Difficulty with speech  I have been asked by Dr. Francine Graven to see this patient in consultation for stroke.  HPI: Lindsey [name] is an 84 y.o. female with medical history significant for colitis, COPD, fibromyalgia, migraine headaches who presented to the emergency room via EMS for evaluation of word finding difficulty started about noon.  Patient reports that she was talking on the phone and noted that she was having difficulty getting her words out.  With no improvement in symptoms she presented for evaluation.  She has had some improvement in speech but still is not back to baseline.  Initial NIHSS of 0. Patient has recently stopped her ASA for a procedure.  Date last known well: 01/03/2020 Time last known well: Time: 12:00 tPA Given: No: Minimal symptoms  Past Medical History:  Diagnosis Date  . Anemia   . Asthma   . Breast cancer ([place])   . Bronchiectasis ([place]) 06/2007  . Cancer ([place])   . Colitis   . COPD (chronic obstructive pulmonary disease) ([place])   . Depression   . DVT (deep venous thrombosis) ([place])    after her right knee surgery.   . Fibromyalgia   . Fibromyalgia   . Hypertension   . Migraine headache with aura   . Osteoarthritis   . Osteoporosis   . Pneumonia   . Polio 1952    Past Surgical History:  Procedure Laterality Date  . ABDOMINAL HYSTERECTOMY    . BACK SURGERY    . BILATERAL TOTAL MASTECTOMY WITH AXILLARY LYMPH NODE DISSECTION    . CARPAL TUNNEL RELEASE     bilateral   . COLONOSCOPY WITH PROPOFOL N/A 08/02/2015   Procedure: COLONOSCOPY WITH PROPOFOL;  Surgeon: Manya Silvas, MD;  Location: [facility] ENDOSCOPY;  Service: Endoscopy;  Laterality: N/A;  . FINGER TENDON REPAIR    . HERNIA REPAIR    . REPLACEMENT TOTAL KNEE     right knee   . SQUAMOUS CELL CARCINOMA EXCISION    . TONSILLECTOMY    . UMBILICAL HERNIA REPAIR    . VAGINAL HYSTERECTOMY      Family History  Family history unknown: Yes   Social  History:  reports that she has never smoked. She has never used smokeless tobacco. She reports current alcohol use of about 1.0 standard drinks of alcohol per week. She reports that she does not use drugs.  Allergies:  Allergies  Allergen Reactions  . Ivp Dye [Iodinated Diagnostic Agents] Anaphylaxis  . Codeine     Dizziness   . Sulfa Antibiotics Hives  . Tegaderm Ag Mesh [Silver]   . Tetracyclines & Related     Stomach upset     Medications:  I have reviewed the patient's current medications. Prior to Admission:  Medications Prior to Admission  Medication Sig Dispense Refill Last Dose  . albuterol (PROVENTIL HFA;VENTOLIN HFA) 108 (90 Base) MCG/ACT inhaler Inhale into the lungs every 6 (six) hours as needed for wheezing or shortness of breath.   Unknown at PRN  . amLODipine (NORVASC) 2.5 MG tablet Take 1 tablet (2.5 mg total) by mouth daily. 180 tablet 3 01/03/2020 at Unknown time  . ARIPiprazole (ABILIFY) 2 MG tablet Take 2 mg by mouth daily.    01/03/2020 at Unknown time  . budesonide (ENTOCORT EC) 3 MG 24 hr capsule Take 9 mg by mouth daily.    Past Week at Unknown time  . doxazosin (CARDURA) 4 MG tablet TAKE 1/2 TABLET BY  MOUTH TWICE DAILY (Patient taking differently: Take 2 mg by mouth 2 (two) times daily. ) 90 tablet 3 01/03/2020 at Unknown  . ezetimibe (ZETIA) 10 MG tablet Take 1 tablet (10 mg total) by mouth daily. 90 tablet 3 7+ days at Unknown  . hydrALAZINE (APRESOLINE) 25 MG tablet Take 1 tablet (25 mg total) by mouth as needed (for BP >170). 30 tablet 6 Unknown at PRN  . hydrochlorothiazide (HYDRODIURIL) 25 MG tablet TAKE 1 TABLET BY MOUTH DAILY (Patient taking differently: Take 25 mg by mouth daily. ) 90 tablet 3 01/03/2020 at 0800  . hydrOXYzine (ATARAX/VISTARIL) 25 MG tablet Take 12.5 mg by mouth 3 (three) times daily as needed for anxiety.    Unknown at PRN  . NON FORMULARY Take 2 capsules by mouth in the morning and at bedtime. Algaecal     . NON FORMULARY Take 2 capsules  by mouth daily. Strontium Boost     . Omega-3 Fatty Acids (FISH OIL) 1000 MG CAPS Take 1,000 mg by mouth daily.     . polyethylene glycol (MIRALAX / GLYCOLAX) packet Take 17 g by mouth daily as needed for mild constipation or moderate constipation.    Unknown at PRN  . Red Yeast Rice Extract (RED YEAST RICE PO) Take 2 capsules by mouth daily.     . Rimegepant Sulfate (NURTEC) 75 MG TBDP Take 75 mg by mouth daily as needed (headache).    Unknown at PRN  . SYMBICORT 160-4.5 MCG/ACT inhaler Inhale 2 puffs into the lungs in the morning and at bedtime.      Marland Kitchen venlafaxine XR (EFFEXOR-XR) 75 MG 24 hr capsule Take 75 mg by mouth daily with breakfast.   01/03/2020 at Unknown time   Scheduled: .  stroke: mapping our early stages of recovery book   Does not apply Once  . [START ON 01/04/2020] ARIPiprazole  5 mg Oral Daily  . aspirin EC  325 mg Oral Daily  . budesonide  3 mg Oral Daily  . ezetimibe  10 mg Oral Daily  . mometasone-formoterol  2 puff Inhalation BID  . omega-3 acid ethyl esters  1,000 mg Oral Daily  . potassium chloride  40 mEq Oral Once  . venlafaxine XR  75 mg Oral Daily    ROS: History obtained from the patient  General ROS: negative for - chills, fatigue, fever, night sweats, weight gain or weight loss Psychological ROS: negative for - behavioral disorder, hallucinations, memory difficulties, mood swings or suicidal ideation Ophthalmic ROS: negative for - blurry vision, double vision, eye pain or loss of vision ENT ROS: negative for - epistaxis, nasal discharge, oral lesions, sore throat, tinnitus or vertigo Allergy and Immunology ROS: negative for - hives or itchy/watery eyes Hematological and Lymphatic ROS: negative for - bleeding problems, bruising or swollen lymph nodes Endocrine ROS: negative for - galactorrhea, hair pattern changes, polydipsia/polyuria or temperature intolerance Respiratory ROS: negative for - cough, hemoptysis, shortness of breath or wheezing Cardiovascular  ROS: negative for - chest pain, dyspnea on exertion, edema or irregular heartbeat Gastrointestinal ROS: negative for - abdominal pain, diarrhea, hematemesis, nausea/vomiting or stool incontinence Genito-Urinary ROS: negative for - dysuria, hematuria, incontinence or urinary frequency/urgency Musculoskeletal ROS: negative for - joint swelling or muscular weakness Neurological ROS: as noted in HPI Dermatological ROS: negative for rash and skin lesion changes  Physical Examination: Blood pressure (!) 223/84, pulse 65, temperature 98.2 F (36.8 C), temperature source Oral, resp. rate 15, height 5\' 2"  (1.575 m), weight 53.1 kg,  SpO2 98 %.  HEENT-  Normocephalic, no lesions, without obvious abnormality.  Normal external eye and conjunctiva.  Normal TM's bilaterally.  Normal auditory canals and external ears. Normal external nose, mucus membranes and septum.  Normal pharynx. Cardiovascular- S1, S2 normal, pulses palpable throughout   Lungs- chest clear, no wheezing, rales, normal symmetric air entry Abdomen- soft, non-tender; bowel sounds normal; no masses,  no organomegaly Extremities- no edema Lymph-no adenopathy palpable Musculoskeletal-no joint tenderness, deformity or swelling Skin-warm and dry, no hyperpigmentation, vitiligo, or suspicious lesions  Neurological Examination   Mental Status: Alert, oriented, thought content appropriate.  Mild word finding difficulties.  Able to follow 3 step commands without difficulty. Cranial Nerves: II: Visual fields grossly normal, pupils equal, round, reactive to light and accommodation III,IV, VI: ptosis not present, extra-ocular motions intact bilaterally V,VII: smile symmetric, facial light touch sensation normal bilaterally VIII: hearing normal bilaterally IX,X: gag reflex present XI: bilateral shoulder shrug XII: midline tongue extension Motor: Right : Upper extremity   5/5    Left:     Upper extremity   5/5  Lower extremity   5/5     Lower  extremity   5/5 Tone and bulk:normal tone throughout; no atrophy noted Sensory: Pinprick and light touch intact throughout, bilaterally Deep Tendon Reflexes: 2+ and symmetric throughout Plantars: Right: downgoing   Left: downgoing Cerebellar: normal finger-to-nose, normal rapid alternating movements and normal heel-to-shin test Gait: normal gait and station    Laboratory Studies:  Basic Metabolic Panel: Recent Labs  Lab 01/03/20 1326  NA 133*  K 3.3*  CL 93*  CO2 30  GLUCOSE 121*  BUN 14  CREATININE 0.64  CALCIUM 8.7*    Liver Function Tests: No results for input(s): AST, ALT, ALKPHOS, BILITOT, PROT, ALBUMIN in the last 168 hours. No results for input(s): LIPASE, AMYLASE in the last 168 hours. No results for input(s): AMMONIA in the last 168 hours.  CBC: Recent Labs  Lab 01/03/20 1326  WBC 5.9  HGB 14.0  HCT 42.7  MCV 91.6  PLT 230    Cardiac Enzymes: No results for input(s): CKTOTAL, CKMB, CKMBINDEX, TROPONINI in the last 168 hours.  BNP: Invalid input(s): POCBNP  CBG: No results for input(s): GLUCAP in the last 168 hours.  Microbiology: Results for orders placed or performed during the hospital encounter of 01/03/20  Respiratory Panel by RT PCR (Flu A&B, Covid) - Nasopharyngeal Swab     Status: None   Collection Time: 01/03/20  4:44 PM   Specimen: Nasopharyngeal Swab  Result Value Ref Range Status   SARS Coronavirus 2 by RT PCR NEGATIVE NEGATIVE Final    Comment: (NOTE) SARS-CoV-2 target nucleic acids are NOT DETECTED. The SARS-CoV-2 RNA is generally detectable in upper respiratoy specimens during the acute phase of infection. The lowest concentration of SARS-CoV-2 viral copies this assay can detect is 131 copies/mL. A negative result does not preclude SARS-Cov-2 infection and should not be used as the sole basis for treatment or other patient management decisions. A negative result may occur with  improper specimen collection/handling, submission  of specimen other than nasopharyngeal swab, presence of viral mutation(s) within the areas targeted by this assay, and inadequate number of viral copies (<131 copies/mL). A negative result must be combined with clinical observations, patient history, and epidemiological information. The expected result is Negative. Fact Sheet for Patients:  PinkCheek.be Fact Sheet for Healthcare Providers:  GravelBags.it This test is not yet ap proved or cleared by the [place] and  has been authorized for detection and/or diagnosis of SARS-CoV-2 by FDA under an Emergency Use Authorization (EUA). This EUA will remain  in effect (meaning this test can be used) for the duration of the COVID-19 declaration under Section 564(b)(1) of the Act, 21 U.S.C. section 360bbb-3(b)(1), unless the authorization is terminated or revoked sooner.    Influenza A by PCR NEGATIVE NEGATIVE Final   Influenza B by PCR NEGATIVE NEGATIVE Final    Comment: (NOTE) The Xpert Xpress SARS-CoV-2/FLU/RSV assay is intended as an aid in  the diagnosis of influenza from Nasopharyngeal swab specimens and  should not be used as a sole basis for treatment. Nasal washings and  aspirates are unacceptable for Xpert Xpress SARS-CoV-2/FLU/RSV  testing. Fact Sheet for Patients: PinkCheek.be Fact Sheet for Healthcare Providers: GravelBags.it This test is not yet approved or cleared by the [place] FDA and  has been authorized for detection and/or diagnosis of SARS-CoV-2 by  FDA under an Emergency Use Authorization (EUA). This EUA will remain  in effect (meaning this test can be used) for the duration of the  Covid-19 declaration under Section 564(b)(1) of the Act, 21  U.S.C. section 360bbb-3(b)(1), unless the authorization is  terminated or revoked. Performed at [facility], [address]  [place], [place] [address]     Coagulation Studies: No results for input(s): LABPROT, INR in the last 72 hours.  Urinalysis:  Recent Labs  Lab 01/03/20 1428  COLORURINE YELLOW*  LABSPEC 1.006  PHURINE 7.0  GLUCOSEU NEGATIVE  HGBUR NEGATIVE  BILIRUBINUR NEGATIVE  KETONESUR NEGATIVE  PROTEINUR NEGATIVE  NITRITE NEGATIVE  LEUKOCYTESUR SMALL*    Lipid Panel: No results found for: CHOL, TRIG, HDL, CHOLHDL, VLDL, LDLCALC  HgbA1C: No results found for: HGBA1C  Urine Drug Screen:  No results found for: LABOPIA, COCAINSCRNUR, LABBENZ, AMPHETMU, THCU, LABBARB  Alcohol Level: No results for input(s): ETH in the last 168 hours.  Other results: EKG: sinus rhythm at 81 bpm with frequent PAC's.  Imaging: CT Head Wo Contrast  Result Date: 01/03/2020 CLINICAL DATA:  Speech difficulty, dizziness EXAM: CT HEAD WITHOUT CONTRAST TECHNIQUE: Contiguous axial images were obtained from the base of the skull through the vertex without intravenous contrast. COMPARISON:  09/20/2019 FINDINGS: Brain: No evidence of acute infarction, hemorrhage, hydrocephalus, extra-axial collection or mass lesion/mass effect. Periventricular and deep white matter hypodensity. Vascular: No hyperdense vessel or unexpected calcification. Skull: Normal. Negative for fracture or focal lesion. Sinuses/Orbits: No acute finding. Other: None. IMPRESSION: No acute intracranial pathology.  Small-vessel white matter disease. Electronically Signed   By: Eddie Candle M.D.   On: 01/03/2020 16:27    Assessment: 84 y.o. female with a medical history significant for HTN, colitis, COPD, fibromyalgia, migraine headaches who presented to the emergency room via EMS for evaluation of word finding difficulty.  Head CT personally reviewed and shows no acute changes.  Acute infarct suspected.  Further work up recommended.  Stroke Risk Factors - hypertension  Plan: 1. HgbA1c, fasting lipid panel 2. MRI, MRA  of the brain without contrast 3. PT  consult, OT consult, Speech consult 4. Echocardiogram 5. Carotid dopplers 6. Prophylactic therapy-Dual antiplatelet therapy with ASA 81mg  and Plavix 75mg  for three weeks with change to ASA 81mg  daily alone as monotherapy after that time. 7. NPO until RN stroke swallow screen 8. Telemetry monitoring 9. Frequent neuro checks  Alexis Goodell, MD Neurology [phone] 01/03/2020, 8:00 PM

## 2020-01-03 NOTE — ED Triage Notes (Signed)
Pt comes POV with dizziness off and on for "a few days". Pt states changes in her BP recently. Taking meds as prescribed.

## 2020-01-03 NOTE — ED Notes (Signed)
Pt ambulates to br with assistance.  ua to lab.

## 2020-01-04 DIAGNOSIS — J449 Chronic obstructive pulmonary disease, unspecified: Secondary | ICD-10-CM | POA: Diagnosis not present

## 2020-01-04 DIAGNOSIS — G459 Transient cerebral ischemic attack, unspecified: Secondary | ICD-10-CM | POA: Diagnosis not present

## 2020-01-04 LAB — LIPID PANEL
Cholesterol: 204 mg/dL — ABNORMAL HIGH (ref 0–200)
HDL: 113 mg/dL (ref >40)
LDL Cholesterol: 84 mg/dL (ref 0–99)
Total CHOL/HDL Ratio: 1.8 RATIO
Triglycerides: 37 mg/dL (ref <150)
VLDL: 7 mg/dL (ref 0–40)

## 2020-01-04 LAB — HEMOGLOBIN A1C
Hgb A1c MFr Bld: 5.6 % (ref 4.8–5.6)
Mean Plasma Glucose: 114.02 mg/dL

## 2020-01-04 MED ORDER — ASPIRIN EC 81 MG PO TBEC
81.0000 mg | DELAYED_RELEASE_TABLET | Freq: Every day | ORAL | Status: DC
  Administered 2020-01-04: 81 mg via ORAL
  Filled 2020-01-04: qty 1

## 2020-01-04 MED ORDER — ATORVASTATIN CALCIUM 40 MG PO TABS: 40.0000 mg | ORAL_TABLET | Freq: Every day | ORAL | 0 refills | Status: DC

## 2020-01-04 MED ORDER — HYDROCHLOROTHIAZIDE 25 MG PO TABS
25.0000 mg | ORAL_TABLET | Freq: Every day | ORAL | Status: DC
  Administered 2020-01-04: 12:00:00 25 mg via ORAL
  Filled 2020-01-04: qty 1

## 2020-01-04 MED ORDER — ASPIRIN 81 MG PO TBEC: 81.0000 mg | DELAYED_RELEASE_TABLET | Freq: Every day | ORAL | 0 refills | Status: DC

## 2020-01-04 MED ORDER — HYDRALAZINE HCL 50 MG PO TABS: 25.0000 mg | ORAL_TABLET | ORAL | Status: DC | PRN

## 2020-01-04 MED ORDER — CLOPIDOGREL BISULFATE 75 MG PO TABS
75.0000 mg | ORAL_TABLET | Freq: Every day | ORAL | Status: DC
  Administered 2020-01-04: 75 mg via ORAL
  Filled 2020-01-04: qty 1

## 2020-01-04 MED ORDER — AMLODIPINE BESYLATE 5 MG PO TABS
2.5000 mg | ORAL_TABLET | Freq: Every day | ORAL | Status: DC
  Administered 2020-01-04: 12:00:00 2.5 mg via ORAL
  Filled 2020-01-04: qty 1

## 2020-01-04 MED ORDER — CLOPIDOGREL BISULFATE 75 MG PO TABS: 75.0000 mg | ORAL_TABLET | Freq: Every day | ORAL | 0 refills | Status: AC

## 2020-01-04 NOTE — Plan of Care (Signed)
  Problem: Education: Goal: Knowledge of General Education information will improve Description: Including pain rating scale, medication(s)/side effects and non-pharmacologic comfort measures Outcome: Progressing   Problem: Clinical Measurements: Goal: Ability to maintain clinical measurements within normal limits will improve Outcome: Progressing   Problem: Education: Goal: Knowledge of secondary prevention will improve Outcome: Progressing Goal: Individualized Educational Video(s) Outcome: Progressing   Problem: Ischemic Stroke/TIA Tissue Perfusion: Goal: Complications of ischemic stroke/TIA will be minimized Outcome: Progressing

## 2020-01-04 NOTE — Evaluation (Signed)
Physical Therapy Evaluation Patient Details Name: Lindsey [name] MRN: [id number] DOB: 09-Aug-1936 Today's Date: 01/04/2020   History of Present Illness  presented to ER secondary to dizziness, word-finding difficulties; admitted for TIA/CVA work up. MRI negative for acute infarct; neurology suspecting small infarct.  Clinical Impression  Upon evaluation, patient alert and oriented; follows commands and demonstrates good effort with all mobility tasks.  Speech clear and fluent; makes needs/wants known and maintains good conversation.  Very mild word-finding difficulties noted at times.  Bilat UE/LE strength and ROM grossly symmetrical and WFL; no focal weakness, sensory or coordination deficit noted.  Able to complete sit/stand, basic transfers and gait (400') without assist device, sup/mod indep.  Demonstrates slow and slightly guarded gait pattern, reciprocal stepping with fair/good step height and length.  Modulates gait speed upon command, but prefers slightly decreased cadence/overall gait speed (10' walk time, 10 seconds). Mild gait deviation/sway with head turns, but able to self-correct without buckling or LOB. Would benefit from skilled PT to address above deficits and promote optimal return to PLOF.;  Recommend transition to HHPT upon discharge from acute hospitalization.     Follow Up Recommendations Home health PT    Equipment Recommendations       Recommendations for Other Services       Precautions / Restrictions Precautions Precautions: Fall Restrictions Weight Bearing Restrictions: No      Mobility  Bed Mobility               General bed mobility comments: seated in recliner beginning/end of treatment session  Transfers Overall transfer level: Needs assistance Equipment used: None Transfers: Sit to/from Stand           General transfer comment: good LE strength/power, minimal/no use of UEs to complete lift off  Ambulation/Gait Ambulation/Gait  assistance: Supervision Gait Distance (Feet): 400 Feet Assistive device: None   Gait velocity: 10' walk time, 10 seconds Gait velocity interpretation: <1.31 ft/sec, indicative of household ambulator General Gait Details: slow and slightly guarded gait pattern, reciprocal stepping with fair/good step height and length.  Modulates gait speed upon command, but prefers slightly decreased cadence/overall gait speed. Mild gait deviation/sway with head turns, but able to self-correct without buckling or LOB.  Stairs            Wheelchair Mobility    Modified Rankin (Stroke Patients Only)       Balance Overall balance assessment: Needs assistance Sitting-balance support: No upper extremity supported;Feet supported Sitting balance-Leahy Scale: Good     Standing balance support: No upper extremity supported Standing balance-Leahy Scale: Fair   Single Leg Stance - Right Leg: 5 Single Leg Stance - Left Leg: 8     Rhomberg - Eyes Opened: 15 Rhomberg - Eyes Closed: 10   High Level Balance Comments: difficulty with feet together due to L knee pain/swelling             Pertinent Vitals/Pain Pain Assessment: No/denies pain    Home Living Family/patient expects to be discharged to:: Other (Comment)([facility]) Living Arrangements: Alone               Additional Comments: Resident of Indep Living at [facility] at [place]: Independent with assistive device(s)         Comments: Retired Designer, fashion/clothing SLP. She has a rollator that she uses intermittently 2/2 occasional whooziness/lightheadedness (worsens with positional changes - says she's had "labile" BP her whole life); indep with ADL (except  buttons), has been receiving HHOT/PT/SLP since the start of the year for cognitive changes pt had noticed.  Denies fall history within previous year.     Hand Dominance   Dominant Hand: Right    Extremity/Trunk Assessment   Upper  Extremity Assessment Upper Extremity Assessment: Overall WFL for tasks assessed(grossly 4+/5 throughout; no focal weakness, sensory deficit appreciated)    Lower Extremity Assessment Lower Extremity Assessment: Overall WFL for tasks assessed(grossly 4+/5 throughout; no focal weakness, sensory deficit appreciated)       Communication   Communication: (very mild word-finding difficulties; able to make wants/needs known, maintains appropriate conversation)  Cognition Arousal/Alertness: Awake/alert Behavior During Therapy: WFL for tasks assessed/performed Overall Cognitive Status: Within Functional Limits for tasks assessed                                        General Comments      Exercises     Assessment/Plan    PT Assessment Patient needs continued PT services  PT Problem List Decreased balance;Decreased mobility       PT Treatment Interventions DME instruction;Gait training;Neuromuscular re-education;Stair training;Functional mobility training;Therapeutic activities;Therapeutic exercise;Balance training;Cognitive remediation;Patient/family education    PT Goals (Current goals can be found in the Care Plan section)  Acute Rehab PT Goals Patient Stated Goal: to return home PT Goal Formulation: With patient Time For Goal Achievement: 01/18/20 Potential to Achieve Goals: Good    Frequency Min 2X/week   Barriers to discharge        Co-evaluation               AM-PAC PT "6 Clicks" Mobility  Outcome Measure Help needed turning from your back to your side while in a flat bed without using bedrails?: None Help needed moving from lying on your back to sitting on the side of a flat bed without using bedrails?: None Help needed moving to and from a bed to a chair (including a wheelchair)?: None Help needed standing up from a chair using your arms (e.g., wheelchair or bedside chair)?: None Help needed to walk in hospital room?: None Help needed  climbing 3-5 steps with a railing? : A Little 6 Click Score: 23    End of Session Equipment Utilized During Treatment: Gait belt Activity Tolerance: Patient tolerated treatment well Patient left: in chair;with call bell/phone within reach;with chair alarm set Nurse Communication: Mobility status PT Visit Diagnosis: Muscle weakness (generalized) (M62.81);Difficulty in walking, not elsewhere classified (R26.2);Other symptoms and signs involving the nervous system (R29.898)    Time: [id number] PT Time Calculation (min) (ACUTE ONLY): 18 min   Charges:   PT Evaluation $PT Eval Moderate Complexity: 1 Mod PT Treatments $Gait Training: 8-22 mins        Aikeem Lilley H. Owens Shark, PT, DPT, NCS 01/04/20, 11:05 AM [phone]

## 2020-01-04 NOTE — Progress Notes (Signed)
OT Cancellation Note  Patient Details Name: Lindsey [name] MRN: [id number] DOB: 09-27-1936   Cancelled Treatment:    Reason Eval/Treat Not Completed: Other (comment). Consult received, chart reviewed. Upon attempt, pt eating breakfast and requesting OT come back after. Will re-attempt OT evaluation at later time this date.  Jeni Salles, MPH, MS, OTR/L ascom [phone] 01/04/20, 9:14 AM

## 2020-01-04 NOTE — Discharge Summary (Signed)
Physician Discharge Summary  Lindsey [name] [id number] DOB: June 21, 1936 DOA: 01/03/2020  PCP: Tracie Harrier, MD  Admit date: 01/03/2020 Discharge date: 01/04/2020  Admitted From: Home Disposition: Home   Recommendations for Outpatient Follow-up:  1. Follow up with PCP in 1-2 weeks 2. Follow up with neurology in 6-8 weeks. 3. Please monitor tolerance of statin 4. Defer to outpatient physician regarding timing of stem cell injections for osteoarthritis in light of dual antiplatelet therapy for 3 weeks.  Home Health: Resume PT, OT, SLP Equipment/Devices: None new Discharge Condition: Stable CODE STATUS: Full Diet recommendation: Heart healthy  Brief/Interim Summary: Lindsey [name] is an 84 y.o. femalewith a history ofHTN, colitis,bronchiectasis, breast CA s/p mastectomy, osteoarthritis,fibromyalgia,and migraine headaches with aura who presented to the emergency room via EMS for evaluation of word finding difficulty.  She had also experienced aura-type perceptions typical of previous migraines. In the ED, VSS, though word blocking continued. CT head showed no acute changes, though had background atrophy and small vessel disease. Neurology was consulted and further work up recommended. MRI has shown no acute stroke, confirmed small vessel disease. PT and OT have evaluated the patient. For TIA, neurology recommends DAPT for 3 weeks followed by aspirin 81mg  monotherapy thereafter.  Discharge Diagnoses:  Principal Problem:   TIA (transient ischemic attack) Active Problems:   Migraine headache with aura   Essential hypertension   Depression   COPD with chronic bronchitis (HCC)   Hypokalemia  TIA: HbA1c 5.6%. Carotid stenosis is < 50% bilaterally. No cardioembolic source on echocardiogram. - Follow up with neurology in 6-8 weeks.  - Dual antiplatelet therapy with ASA 81mg  and Plavix 75mg  for three weeks with change to ASA 81mg  daily alone as monotherapy after that time. -  Continue home PT, OT, SLP  Hypercholesterolemia: LDL 84, though HDL is phenomenal at 113.  - Continue zetia, red yeast rice extract - In light of TIA, will initiate statin Tx and will need to monitor for efficacy and tolerance.  Migraine with aura: Not suspected to be cause of neurological signs/symptoms on presentation.  - Continue home Tx.   COPD, bronchiectasis:  - No changes to home Tx  HTN:  - Can resume home Tx  Depression: Quiescent.  - Continue home Tx  Hypokalemia: Supplemented and improved.  Discharge Instructions Discharge Instructions    Diet - low sodium heart healthy   Complete by: As directed    Discharge instructions   Complete by: As directed    - Follow up with neurology, Dr. Manuella Ghazi, in 6-8 weeks.  - Dual antiplatelet therapy with ASA 81mg  and Plavix 75mg  for three weeks with change to ASA 81mg  daily alone as monotherapy after that time. - Start taking lipitor 40mg  daily to further reduce your risk of stroke. - Continue home PT, OT, SLP   Increase activity slowly   Complete by: As directed      Allergies as of 01/04/2020      Reactions   Ivp Dye [iodinated Diagnostic Agents] Anaphylaxis   Codeine    Dizziness    Sulfa Antibiotics Hives   Tegaderm Ag Mesh [silver]    Tetracyclines & Related    Stomach upset       Medication List    TAKE these medications   albuterol 108 (90 Base) MCG/ACT inhaler Commonly known as: VENTOLIN HFA Inhale into the lungs every 6 (six) hours as needed for wheezing or shortness of breath.   amLODipine 2.5 MG tablet Commonly known as: NORVASC Take 1 tablet (  2.5 mg total) by mouth daily.   ARIPiprazole 2 MG tablet Commonly known as: ABILIFY Take 2 mg by mouth daily.   aspirin 81 MG EC tablet Take 1 tablet (81 mg total) by mouth daily.   atorvastatin 40 MG tablet Commonly known as: Lipitor Take 1 tablet (40 mg total) by mouth daily.   budesonide 3 MG 24 hr capsule Commonly known as: ENTOCORT EC Take 9 mg by mouth  daily.   clopidogrel 75 MG tablet Commonly known as: PLAVIX Take 1 tablet (75 mg total) by mouth daily for 21 days.   doxazosin 4 MG tablet Commonly known as: CARDURA TAKE 1/2 TABLET BY MOUTH TWICE DAILY   ezetimibe 10 MG tablet Commonly known as: ZETIA Take 1 tablet (10 mg total) by mouth daily.   Fish Oil 1000 MG Caps Take 1,000 mg by mouth daily.   hydrALAZINE 25 MG tablet Commonly known as: APRESOLINE Take 1 tablet (25 mg total) by mouth as needed (for BP >170).   hydrochlorothiazide 25 MG tablet Commonly known as: HYDRODIURIL TAKE 1 TABLET BY MOUTH DAILY   hydrOXYzine 25 MG tablet Commonly known as: ATARAX/VISTARIL Take 12.5 mg by mouth 3 (three) times daily as needed for anxiety.   NON FORMULARY Take 2 capsules by mouth in the morning and at bedtime. Algaecal   NON FORMULARY Take 2 capsules by mouth daily. Strontium Boost   Nurtec 75 MG Tbdp Generic drug: Rimegepant Sulfate Take 75 mg by mouth daily as needed (headache).   polyethylene glycol 17 g packet Commonly known as: MIRALAX / GLYCOLAX Take 17 g by mouth daily as needed for mild constipation or moderate constipation.   RED YEAST RICE PO Take 2 capsules by mouth daily.   Symbicort 160-4.5 MCG/ACT inhaler Generic drug: budesonide-formoterol Inhale 2 puffs into the lungs in the morning and at bedtime.   venlafaxine XR 75 MG 24 hr capsule Commonly known as: EFFEXOR-XR Take 75 mg by mouth daily with breakfast.      Follow-up Information    Hande, Vishwanath, MD. Schedule an appointment as soon as possible for a visit in 2 week(s).   Specialty: Internal Medicine Contact information: [facility] [place] [address] [phone]          Allergies  Allergen Reactions  . Ivp Dye [Iodinated Diagnostic Agents] Anaphylaxis  . Codeine     Dizziness   . Sulfa Antibiotics Hives  . Tegaderm Ag Mesh [Silver]   . Tetracyclines & Related     Stomach  upset     Consultations:  Neurology  Procedures/Studies: CT Head Wo Contrast  Result Date: 01/03/2020 CLINICAL DATA:  Speech difficulty, dizziness EXAM: CT HEAD WITHOUT CONTRAST TECHNIQUE: Contiguous axial images were obtained from the base of the skull through the vertex without intravenous contrast. COMPARISON:  09/20/2019 FINDINGS: Brain: No evidence of acute infarction, hemorrhage, hydrocephalus, extra-axial collection or mass lesion/mass effect. Periventricular and deep white matter hypodensity. Vascular: No hyperdense vessel or unexpected calcification. Skull: Normal. Negative for fracture or focal lesion. Sinuses/Orbits: No acute finding. Other: None. IMPRESSION: No acute intracranial pathology.  Small-vessel white matter disease. Electronically Signed   By: Eddie Candle M.D.   On: 01/03/2020 16:27   MR BRAIN WO CONTRAST  Result Date: 01/03/2020 CLINICAL DATA:  Initial evaluation for intermittent dizziness, weakness for several days. EXAM: MRI HEAD WITHOUT CONTRAST TECHNIQUE: Multiplanar, multiecho pulse sequences of the brain and surrounding structures were obtained without intravenous contrast. COMPARISON:  Prior head CT  from earlier same day. FINDINGS: Brain: Generalized age-related cerebral atrophy. Patchy T2/FLAIR hyperintensity within the periventricular and deep white matter both cerebral hemispheres most consistent with chronic small vessel ischemic disease, mild for age. Mild patchy involvement of the pons. Superimposed tiny remote left cerebellar infarct. No abnormal foci of restricted diffusion to suggest acute or subacute ischemia. Gray-white matter differentiation maintained. No encephalomalacia to suggest chronic cortical infarction. No foci of susceptibility artifact to suggest acute or chronic intracranial hemorrhage. No mass lesion, midline shift or mass effect. No hydrocephalus or extra-axial fluid collection. Incidental note made of an empty sella. Midline structures intact.  Vascular: Major intracranial vascular flow voids are maintained. Skull and upper cervical spine: Prominent degenerative thickening noted at the tectorial membrane without significant stenosis. Craniocervical junction otherwise unremarkable. Bone marrow signal intensity within normal limits. No scalp soft tissue abnormality. Sinuses/Orbits: Globes and orbital soft tissues within normal limits. Patient status post bilateral ocular lens replacement. Sequelae of prior sinus surgery noted. Mild chronic mucoperiosteal thickening noted within the ethmoidal air cells. Trace right mastoid effusion noted, of doubtful significance. Other: None. IMPRESSION: 1. No acute intracranial infarct or other abnormality. 2. Age-related cerebral atrophy with mild chronic small vessel ischemic disease. Electronically Signed   By: Jeannine Boga M.D.   On: 01/03/2020 22:02   US Carotid Bilateral (at [facility] and [facility] only)  Result Date: 01/04/2020 CLINICAL DATA:  Difficulty finding words. EXAM: BILATERAL CAROTID DUPLEX ULTRASOUND TECHNIQUE: Pearline Cables scale imaging, color Doppler and duplex ultrasound were performed of bilateral carotid and vertebral arteries in the neck. COMPARISON:  None. FINDINGS: Criteria: Quantification of carotid stenosis is based on velocity parameters that correlate the residual internal carotid diameter with NASCET-based stenosis levels, using the diameter of the distal internal carotid lumen as the denominator for stenosis measurement. The following velocity measurements were obtained: RIGHT ICA: 57/18 cm/sec CCA: [id number] cm/sec SYSTOLIC ICA/CCA RATIO:  1.0 ECA: 59 cm/sec LEFT ICA: 100/22 cm/sec CCA: [id number] cm/sec SYSTOLIC ICA/CCA RATIO:  1.9 ECA: 45 cm/sec RIGHT CAROTID ARTERY: Small amount of echogenic plaque at the right carotid bulb. External carotid artery is patent with normal waveform. Question a small amount of heterogeneous plaque in the mid internal carotid artery. Normal waveforms and velocities in the  internal carotid artery. RIGHT VERTEBRAL ARTERY: Antegrade flow and normal waveform in the right vertebral artery. LEFT CAROTID ARTERY: Small amount of echogenic plaque at the left carotid bulb. External carotid artery is patent with normal waveform. Small amount of plaque in the proximal internal carotid artery. Normal waveforms and velocities in the internal carotid artery. LEFT VERTEBRAL ARTERY: Antegrade flow and normal waveform in the left vertebral artery. IMPRESSION: Mild atherosclerotic disease in the carotid arteries without significant stenosis. Estimated degree of stenosis in the internal carotid arteries is less than 50% bilaterally. Patent vertebral arteries with antegrade flow. Electronically Signed   By: Markus Daft M.D.   On: 01/04/2020 08:31   ECHOCARDIOGRAM COMPLETE  Result Date: 01/03/2020    ECHOCARDIOGRAM REPORT   Patient Name:   ANECIA SERVEN Date of Exam: 01/03/2020 Medical Rec #:  [id number]        Height:       62.0 in Accession #:    [id number]       Weight:       117.0 lb Date of Birth:  March 28, 1936       BSA:          1.522 m Patient Age:    30 years  BP:           180/84 mmHg Patient Gender: F                HR:           64 bpm. Exam Location:  [facility] Procedure: 2D Echo, Cardiac Doppler and Color Doppler Indications:     I163.9 Stroke  History:         Patient has no prior history of Echocardiogram examinations.                  Bilateral breast implants; Risk Factors:Hypertension. History                  of breast cancer. COPD. Pneumonia. DVT.  Sonographer:     Wilford Sports Rodgers-Jones Referring Phys:  [id number] AGBATA Diagnosing Phys: Yolonda Kida MD IMPRESSIONS  1. Left ventricular ejection fraction, by estimation, is 60 to 65%. The left ventricle has normal function. The left ventricle has no regional wall motion abnormalities. Left ventricular diastolic parameters are consistent with Grade II diastolic dysfunction (pseudonormalization).  2. Right ventricular  systolic function is normal. The right ventricular size is normal.  3. The mitral valve is normal in structure. Mild mitral valve regurgitation.  4. The aortic valve is normal in structure. Aortic valve regurgitation is mild. FINDINGS  Left Ventricle: Left ventricular ejection fraction, by estimation, is 60 to 65%. The left ventricle has normal function. The left ventricle has no regional wall motion abnormalities. The left ventricular internal cavity size was normal in size. There is  no left ventricular hypertrophy. Left ventricular diastolic parameters are consistent with Grade II diastolic dysfunction (pseudonormalization). Right Ventricle: The right ventricular size is normal. No increase in right ventricular wall thickness. Right ventricular systolic function is normal. Left Atrium: Left atrial size was normal in size. Right Atrium: Right atrial size was normal in size. Pericardium: There is no evidence of pericardial effusion. Mitral Valve: The mitral valve is normal in structure. Mild mitral valve regurgitation. Tricuspid Valve: The tricuspid valve is normal in structure. Tricuspid valve regurgitation is mild. Aortic Valve: The aortic valve is normal in structure. Aortic valve regurgitation is mild. Aortic regurgitation PHT measures 378 msec. Pulmonic Valve: The pulmonic valve was normal in structure. Pulmonic valve regurgitation is not visualized. Aorta: The aortic root is normal in size and structure. IAS/Shunts: The atrial septum is grossly normal.  LEFT VENTRICLE PLAX 2D LVIDd:         4.65 cm  Diastology LVIDs:         3.07 cm  LV e' lateral:   8.05 cm/s LV PW:         1.01 cm  LV E/e' lateral: 5.7 LV IVS:        0.84 cm  LV e' medial:    5.22 cm/s LVOT diam:     1.80 cm  LV E/e' medial:  8.7 LV SV:         51 LV SV Index:   34 LVOT Area:     2.54 cm  RIGHT VENTRICLE RV Basal diam:  3.97 cm RV S prime:     12.90 cm/s TAPSE (M-mode): 2.8 cm LEFT ATRIUM             Index       RIGHT ATRIUM            Index LA diam:        3.70 cm 2.43 cm/m  RA Area:  14.60 cm LA Vol (A2C):   59.9 ml 39.35 ml/m RA Volume:   40.70 ml  26.74 ml/m LA Vol (A4C):   51.7 ml 33.96 ml/m LA Biplane Vol: 56.7 ml 37.25 ml/m  AORTIC VALVE LVOT Vmax:   99.20 cm/s LVOT Vmean:  66.100 cm/s LVOT VTI:    0.202 m AI PHT:      378 msec  AORTA Ao Root diam: 3.40 cm MITRAL VALVE MV Area (PHT): 2.99 cm    SHUNTS MV Decel Time: 254 msec    Systemic VTI:  0.20 m MV E velocity: 45.60 cm/s  Systemic Diam: 1.80 cm MV A velocity: 79.10 cm/s MV E/A ratio:  0.58 Dwayne D Callwood MD Electronically signed by Yolonda Kida MD Signature Date/Time: 01/03/2020/10:35:41 PM    Final      Subjective: Feels well, symptoms have improved, though intermittently having some speech difficulties. No numbness, weakness, or other changes.  Discharge Exam: Vitals:   01/03/20 2331 01/04/20 0832  BP: (!) 129/56 (!) 157/96  Pulse:  61  Resp: (!) 21 20  Temp: 98.8 F (37.1 C) (!) 97.4 F (36.3 C)  SpO2:  97%   General: Pleasant female in no distress Cardiovascular: RRR, S1/S2 +, no rubs, no gallops Respiratory: CTA bilaterally, no wheezing, no rhonchi Neuro: Alert, oriented, conversant with fluent speech and very occasional limited word blocking. No dysarthria. Strength intact throughout.  Labs: Basic Metabolic Panel: Recent Labs  Lab 01/03/20 1326  NA 133*  K 3.3*  CL 93*  CO2 30  GLUCOSE 121*  BUN 14  CREATININE 0.64  CALCIUM 8.7*   Liver Function Tests: No results for input(s): AST, ALT, ALKPHOS, BILITOT, PROT, ALBUMIN in the last 168 hours. No results for input(s): LIPASE, AMYLASE in the last 168 hours. No results for input(s): AMMONIA in the last 168 hours. CBC: Recent Labs  Lab 01/03/20 1326  WBC 5.9  HGB 14.0  HCT 42.7  MCV 91.6  PLT 230   Cardiac Enzymes: No results for input(s): CKTOTAL, CKMB, CKMBINDEX, TROPONINI in the last 168 hours. BNP: Invalid input(s): POCBNP CBG: No results for input(s): GLUCAP  in the last 168 hours. D-Dimer No results for input(s): DDIMER in the last 72 hours. Hgb A1c Recent Labs    01/04/20 0455  HGBA1C 5.6   Lipid Profile Recent Labs    01/04/20 0455  CHOL 204*  HDL 113  LDLCALC 84  TRIG 37  CHOLHDL 1.8   Thyroid function studies No results for input(s): TSH, T4TOTAL, T3FREE, THYROIDAB in the last 72 hours.  Invalid input(s): FREET3 Anemia work up No results for input(s): VITAMINB12, FOLATE, FERRITIN, TIBC, IRON, RETICCTPCT in the last 72 hours. Urinalysis    Component Value Date/Time   COLORURINE YELLOW (A) 01/03/2020 1428   APPEARANCEUR CLEAR (A) 01/03/2020 1428   LABSPEC 1.006 01/03/2020 1428   PHURINE 7.0 01/03/2020 1428   GLUCOSEU NEGATIVE 01/03/2020 1428   HGBUR NEGATIVE 01/03/2020 1428   BILIRUBINUR NEGATIVE 01/03/2020 1428   KETONESUR NEGATIVE 01/03/2020 1428   PROTEINUR NEGATIVE 01/03/2020 1428   NITRITE NEGATIVE 01/03/2020 1428   LEUKOCYTESUR SMALL (A) 01/03/2020 1428    Microbiology Recent Results (from the past 240 hour(s))  Respiratory Panel by RT PCR (Flu A&B, Covid) - Nasopharyngeal Swab     Status: None   Collection Time: 01/03/20  4:44 PM   Specimen: Nasopharyngeal Swab  Result Value Ref Range Status   SARS Coronavirus 2 by RT PCR NEGATIVE NEGATIVE Final    Comment: (NOTE)  SARS-CoV-2 target nucleic acids are NOT DETECTED. The SARS-CoV-2 RNA is generally detectable in upper respiratoy specimens during the acute phase of infection. The lowest concentration of SARS-CoV-2 viral copies this assay can detect is 131 copies/mL. A negative result does not preclude SARS-Cov-2 infection and should not be used as the sole basis for treatment or other patient management decisions. A negative result may occur with  improper specimen collection/handling, submission of specimen other than nasopharyngeal swab, presence of viral mutation(s) within the areas targeted by this assay, and inadequate number of viral copies (<131  copies/mL). A negative result must be combined with clinical observations, patient history, and epidemiological information. The expected result is Negative. Fact Sheet for Patients:  PinkCheek.be Fact Sheet for Healthcare Providers:  GravelBags.it This test is not yet ap proved or cleared by the [place] FDA and  has been authorized for detection and/or diagnosis of SARS-CoV-2 by FDA under an Emergency Use Authorization (EUA). This EUA will remain  in effect (meaning this test can be used) for the duration of the COVID-19 declaration under Section 564(b)(1) of the Act, 21 U.S.C. section 360bbb-3(b)(1), unless the authorization is terminated or revoked sooner.    Influenza A by PCR NEGATIVE NEGATIVE Final   Influenza B by PCR NEGATIVE NEGATIVE Final    Comment: (NOTE) The Xpert Xpress SARS-CoV-2/FLU/RSV assay is intended as an aid in  the diagnosis of influenza from Nasopharyngeal swab specimens and  should not be used as a sole basis for treatment. Nasal washings and  aspirates are unacceptable for Xpert Xpress SARS-CoV-2/FLU/RSV  testing. Fact Sheet for Patients: PinkCheek.be Fact Sheet for Healthcare Providers: GravelBags.it This test is not yet approved or cleared by the [place] FDA and  has been authorized for detection and/or diagnosis of SARS-CoV-2 by  FDA under an Emergency Use Authorization (EUA). This EUA will remain  in effect (meaning this test can be used) for the duration of the  Covid-19 declaration under Section 564(b)(1) of the Act, 21  U.S.C. section 360bbb-3(b)(1), unless the authorization is  terminated or revoked. Performed at [facility], [address], [place], [place] [address]     Time coordinating discharge: Approximately 40 minutes  Patrecia Pour, MD  [facility] 01/06/2020, 5:18 PM

## 2020-01-04 NOTE — Plan of Care (Signed)
  Problem: Education: Goal: Knowledge of General Education information will improve Description: Including pain rating scale, medication(s)/side effects and non-pharmacologic comfort measures Outcome: Adequate for Discharge   Problem: Clinical Measurements: Goal: Ability to maintain clinical measurements within normal limits will improve Outcome: Adequate for Discharge Goal: Will remain free from infection Outcome: Adequate for Discharge Goal: Diagnostic test results will improve Outcome: Adequate for Discharge Goal: Respiratory complications will improve Outcome: Adequate for Discharge Goal: Cardiovascular complication will be avoided Outcome: Adequate for Discharge   Problem: Activity: Goal: Risk for activity intolerance will decrease Outcome: Adequate for Discharge   Problem: Nutrition: Goal: Adequate nutrition will be maintained Outcome: Adequate for Discharge   Problem: Coping: Goal: Level of anxiety will decrease Outcome: Adequate for Discharge   Problem: Elimination: Goal: Will not experience complications related to bowel motility Outcome: Adequate for Discharge Goal: Will not experience complications related to urinary retention Outcome: Adequate for Discharge   Problem: Pain Managment: Goal: General experience of comfort will improve Outcome: Adequate for Discharge   Problem: Safety: Goal: Ability to remain free from injury will improve Outcome: Adequate for Discharge   Problem: Skin Integrity: Goal: Risk for impaired skin integrity will decrease Outcome: Adequate for Discharge   Problem: Education: Goal: Knowledge of secondary prevention will improve Outcome: Adequate for Discharge Goal: Individualized Educational Video(s) Outcome: Adequate for Discharge   Problem: Ischemic Stroke/TIA Tissue Perfusion: Goal: Complications of ischemic stroke/TIA will be minimized Outcome: Adequate for Discharge

## 2020-01-04 NOTE — TOC Progression Note (Addendum)
Transition of Care [facility]) - Progression Note    Patient Details  Name: Lindsey [name] MRN: [id number] Date of Birth: [birth year]  Transition [facility] CM/SW Contact  Bebe Moncure, Gardiner Rhyme, LCSW Phone Number: 01/04/2020, 1:34 PM  Clinical Narrative:  Order for pt to have [place] and [place], spoke with Shannon and have faxed information to facility. They have a contract with [facility] who will call pt and set up time to see her. No equipment needs. Pt aware being discharged today and feels like she has recovered from this event. Pt is returning to independent living at [place].          Expected Discharge Plan and Services           Expected Discharge Date: 01/04/20                                     Social Determinants of Health (SDOH) Interventions    Readmission Risk Interventions No flowsheet data found.

## 2020-01-04 NOTE — Progress Notes (Addendum)
SLP Cancellation Note  Patient Details Name: Lindsey [name] MRN: [id number] DOB: [birth year]   Cancelled treatment:       Reason Eval/Treat Not Completed: (chart reviewed; met w/ pt). Met w/ pt as lunch meal arrived. Upon entering room, pt was on the phone w/ her grandson engaged in conversation. Pt then conversed w/ this SLP in general conversation re: self,history, and PLOF. Pt shared that she was receiving both OT and ST services at [facility] for more higher level cognitive-linguistic tasks prior to this admission. Pt endorsed general intermittent word-finding challenges prior to this admission also. She stated that her new deficits at admission are "improved now". Noted MRI results: "Age-related cerebral atrophy with mild chronic small vessel ischemic disease; No acute intracranial infarct".  As pt is able to engage in functional verbal communication w/ family and staff currently w/ improvement in s/s from admission, recommend f/u at discharge w/ her (ongoing) ST services for a comprehensive, formal Cognitive-linguistic evaluation involving more executive functioning communication skills. Briefly discussed communication strategies. Pt agreed. CM and NSG updated.     Orinda Kenner, MS, CCC-SLP Niah Heinle 01/04/2020, 2:01 PM

## 2020-01-04 NOTE — Evaluation (Signed)
Occupational Therapy Evaluation Patient Details Name: Lindsey [name] MRN: [id number] DOB: 21-Oct-1935 Today's Date: 01/04/2020    History of Present Illness presented to ER secondary to dizziness, word-finding difficulties; admitted for TIA/CVA work up. MRI negative for acute infarct; neurology suspecting small infarct.   Clinical Impression   Pt seen for OT evaluation this date. Prior to hospital admission, pt was independent in all aspects of ADL/IADL, driving, and participating in committees and social events at her [place]. Pt was using a rollator for ADL mobility PRN when she felt lightheaded/whoozy. Pt denies falls history in past 12 months. Pt lives by herself in a 1 level cottage. Currently pt experiencing mild word finding deficits but is improving, per pt report. Pt required supervision to [place] for ADL transfers for safety. Pt denied lightheadedness throughout session. Recommend pt continue [place] services with emphasis on medication mgt and other higher level cognitive tasks to maximize safety, independence, and minimize caregiver burden.    Follow Up Recommendations  Home health OT    Equipment Recommendations  None recommended by OT    Recommendations for Other Services       Precautions / Restrictions Precautions Precautions: Fall Restrictions Weight Bearing Restrictions: No      Mobility Bed Mobility Overal bed mobility: Needs Assistance Bed Mobility: Supine to Sit     Supine to sit: Supervision     General bed mobility comments: use of bed rails, no physical assist required  Transfers Overall transfer level: Needs assistance Equipment used: None Transfers: Sit to/from Stand Sit to Stand: Min guard         General transfer comment: good LE strength/power, minimal/no use of UEs to complete lift off    Balance Overall balance assessment: Needs assistance Sitting-balance support: No upper extremity supported;Feet supported Sitting balance-Leahy  Scale: Good     Standing balance support: No upper extremity supported Standing balance-Leahy Scale: Fair   Single Leg Stance - Right Leg: 5 Single Leg Stance - Left Leg: 8     Rhomberg - Eyes Opened: 15 Rhomberg - Eyes Closed: 10   High Level Balance Comments: difficulty with feet together due to L knee pain/swelling           ADL either performed or assessed with clinical judgement   ADL Overall ADL's : Needs assistance/impaired                                       General ADL Comments: supervision for ADL for safety, no assist required for basic ADL; supervision for higher level IADL tasks such as meal prep and medication mgt for safety     Vision Baseline Vision/History: Wears glasses Wears Glasses: At all times Patient Visual Report: No change from baseline Vision Assessment?: No apparent visual deficits     Perception     Praxis      Pertinent Vitals/Pain Pain Assessment: No/denies pain     Hand Dominance Right   Extremity/Trunk Assessment Upper Extremity Assessment Upper Extremity Assessment: Overall WFL for tasks assessed(grossly 4+/5 throughout; no focal weakness, sensory deficit appreciated)   Lower Extremity Assessment Lower Extremity Assessment: Overall WFL for tasks assessed(grossly 4+/5 throughout; no focal weakness, sensory deficit appreciated)       Communication Communication Communication: (very mild word-finding difficulties; able to make wants/needs known, maintains appropriate conversation)   Cognition Arousal/Alertness: Awake/alert Behavior During Therapy: WFL for tasks assessed/performed Overall Cognitive Status:  Within Functional Limits for tasks assessed                                     General Comments       Exercises Other Exercises Other Exercises: Pt educated in button hook to improve pt's independence with buttoning, encouraged her to speak with HHOT regarding further  practice/instruction Other Exercises: Pt encouraged to seek assist initially for medication mgt and encouraged to speak with MD regarding return to driving Other Exercises: Pt instructed in body positioning and AE to improve pt's indep with ADL tasks and to minimize bending over   Shoulder Instructions      Home Living Family/patient expects to be discharged to:: Other (Comment)([facility]) Living Arrangements: Alone                               Additional Comments: Resident of Indep Living at [facility] [place]      Prior Functioning/Environment Level of Independence: Independent with assistive device(s)        Comments: Retired Designer, fashion/clothing SLP. She has a rollator that she uses intermittently 2/2 occasional whooziness/lightheadedness (worsens with positional changes - says she's had "labile" BP her whole life); indep with ADL (except buttons), has been receiving HHOT/PT/SLP since the start of the year for cognitive changes pt had noticed.  Denies fall history within previous year.        OT Problem List: Decreased cognition;Decreased knowledge of use of DME or AE      OT Treatment/Interventions:      OT Goals(Current goals can be found in the care plan section) Acute Rehab OT Goals Patient Stated Goal: to return home OT Goal Formulation: All assessment and education complete, DC therapy  OT Frequency:     Barriers to D/C:            Co-evaluation              AM-PAC OT "6 Clicks" Daily Activity     Outcome Measure Help from another person eating meals?: None Help from another person taking care of personal grooming?: None Help from another person toileting, which includes using toliet, bedpan, or urinal?: A Little Help from another person bathing (including washing, rinsing, drying)?: A Little Help from another person to put on and taking off regular upper body clothing?: None Help from another person to put on and taking off regular lower  body clothing?: A Little 6 Click Score: 21   End of Session Equipment Utilized During Treatment: Gait belt  Activity Tolerance: Patient tolerated treatment well Patient left: in chair;with call bell/phone within reach;with chair alarm set  OT Visit Diagnosis: Other abnormalities of gait and mobility (R26.89);Other symptoms and signs involving cognitive function                Time: [id number] OT Time Calculation (min): 40 min Charges:  OT General Charges $OT Visit: 1 Visit OT Evaluation $OT Eval Moderate Complexity: 1 Mod OT Treatments $Self Care/Home Management : 8-22 mins  Jeni Salles, MPH, MS, OTR/L ascom [phone] 01/04/20, 11:34 AM

## 2020-01-04 NOTE — Progress Notes (Signed)
Subjective: Patient reports still feeling that her speech is a little off but much improved.  No new neurological complaints.    Objective: Current vital signs: BP (!) 157/96 (BP Location: Right Arm)   Pulse 61   Temp (!) 97.4 F (36.3 C) (Oral)   Resp 20   Ht 5\' 2"  (1.575 m)   Wt 53.1 kg   SpO2 97%   BMI 21.40 kg/m  Vital signs in last 24 hours: Temp:  [97.4 F (36.3 C)-98.8 F (37.1 C)] 97.4 F (36.3 C) (04/01 0832) Pulse Rate:  [61-67] 61 (04/01 0832) Resp:  [14-21] 20 (04/01 0832) BP: (129-223)/(56-96) 157/96 (04/01 0832) SpO2:  [93 %-99 %] 97 % (04/01 0832) Weight:  [53.1 kg] 53.1 kg (03/31 1318)  Intake/Output from previous day: No intake/output data recorded. Intake/Output this shift: No intake/output data recorded. Nutritional status:  Diet Order            Diet regular Room service appropriate? Yes; Fluid consistency: Thin  Diet effective now              Neurologic Exam: Mental Status: Alert, oriented, thought content appropriate.  Speech fluent.  Able to follow 3 step commands without difficulty. Cranial Nerves: II: Visual fields grossly normal, pupils equal, round, reactive to light and accommodation III,IV, VI: ptosis not present, extra-ocular motions intact bilaterally V,VII: smile symmetric, facial light touch sensation normal bilaterally VIII: hearing normal bilaterally IX,X: gag reflex present XI: bilateral shoulder shrug XII: midline tongue extension Motor: 5/5 throughout Sensory: Pinprick and light touch intact throughout, bilaterally   Lab Results: Basic Metabolic Panel: Recent Labs  Lab 01/03/20 1326  NA 133*  K 3.3*  CL 93*  CO2 30  GLUCOSE 121*  BUN 14  CREATININE 0.64  CALCIUM 8.7*    Liver Function Tests: No results for input(s): AST, ALT, ALKPHOS, BILITOT, PROT, ALBUMIN in the last 168 hours. No results for input(s): LIPASE, AMYLASE in the last 168 hours. No results for input(s): AMMONIA in the last 168  hours.  CBC: Recent Labs  Lab 01/03/20 1326  WBC 5.9  HGB 14.0  HCT 42.7  MCV 91.6  PLT 230    Cardiac Enzymes: No results for input(s): CKTOTAL, CKMB, CKMBINDEX, TROPONINI in the last 168 hours.  Lipid Panel: Recent Labs  Lab 01/04/20 0455  CHOL 204*  TRIG 37  HDL 113  CHOLHDL 1.8  VLDL 7  LDLCALC 84    CBG: No results for input(s): GLUCAP in the last 168 hours.  Microbiology: Results for orders placed or performed during the hospital encounter of 01/03/20  Respiratory Panel by RT PCR (Flu A&B, Covid) - Nasopharyngeal Swab     Status: None   Collection Time: 01/03/20  4:44 PM   Specimen: Nasopharyngeal Swab  Result Value Ref Range Status   SARS Coronavirus 2 by RT PCR NEGATIVE NEGATIVE Final    Comment: (NOTE) SARS-CoV-2 target nucleic acids are NOT DETECTED. The SARS-CoV-2 RNA is generally detectable in upper respiratoy specimens during the acute phase of infection. The lowest concentration of SARS-CoV-2 viral copies this assay can detect is 131 copies/mL. A negative result does not preclude SARS-Cov-2 infection and should not be used as the sole basis for treatment or other patient management decisions. A negative result may occur with  improper specimen collection/handling, submission of specimen other than nasopharyngeal swab, presence of viral mutation(s) within the areas targeted by this assay, and inadequate number of viral copies (<131 copies/mL). A negative result must be combined  with clinical observations, patient history, and epidemiological information. The expected result is Negative. Fact Sheet for Patients:  PinkCheek.be Fact Sheet for Healthcare Providers:  GravelBags.it This test is not yet ap proved or cleared by the [place] FDA and  has been authorized for detection and/or diagnosis of SARS-CoV-2 by FDA under an Emergency Use Authorization (EUA). This EUA will remain  in  effect (meaning this test can be used) for the duration of the COVID-19 declaration under Section 564(b)(1) of the Act, 21 U.S.C. section 360bbb-3(b)(1), unless the authorization is terminated or revoked sooner.    Influenza A by PCR NEGATIVE NEGATIVE Final   Influenza B by PCR NEGATIVE NEGATIVE Final    Comment: (NOTE) The Xpert Xpress SARS-CoV-2/FLU/RSV assay is intended as an aid in  the diagnosis of influenza from Nasopharyngeal swab specimens and  should not be used as a sole basis for treatment. Nasal washings and  aspirates are unacceptable for Xpert Xpress SARS-CoV-2/FLU/RSV  testing. Fact Sheet for Patients: PinkCheek.be Fact Sheet for Healthcare Providers: GravelBags.it This test is not yet approved or cleared by the [place] FDA and  has been authorized for detection and/or diagnosis of SARS-CoV-2 by  FDA under an Emergency Use Authorization (EUA). This EUA will remain  in effect (meaning this test can be used) for the duration of the  Covid-19 declaration under Section 564(b)(1) of the Act, 21  U.S.C. section 360bbb-3(b)(1), unless the authorization is  terminated or revoked. Performed at [facility], [address] [place], [place] [address]     Coagulation Studies: No results for input(s): LABPROT, INR in the last 72 hours.  Imaging: CT Head Wo Contrast  Result Date: 01/03/2020 CLINICAL DATA:  Speech difficulty, dizziness EXAM: CT HEAD WITHOUT CONTRAST TECHNIQUE: Contiguous axial images were obtained from the base of the skull through the vertex without intravenous contrast. COMPARISON:  09/20/2019 FINDINGS: Brain: No evidence of acute infarction, hemorrhage, hydrocephalus, extra-axial collection or mass lesion/mass effect. Periventricular and deep white matter hypodensity. Vascular: No hyperdense vessel or unexpected calcification. Skull: Normal. Negative for fracture or focal lesion.  Sinuses/Orbits: No acute finding. Other: None. IMPRESSION: No acute intracranial pathology.  Small-vessel white matter disease. Electronically Signed   By: Eddie Candle M.D.   On: 01/03/2020 16:27   MR BRAIN WO CONTRAST  Result Date: 01/03/2020 CLINICAL DATA:  Initial evaluation for intermittent dizziness, weakness for several days. EXAM: MRI HEAD WITHOUT CONTRAST TECHNIQUE: Multiplanar, multiecho pulse sequences of the brain and surrounding structures were obtained without intravenous contrast. COMPARISON:  Prior head CT from earlier same day. FINDINGS: Brain: Generalized age-related cerebral atrophy. Patchy T2/FLAIR hyperintensity within the periventricular and deep white matter both cerebral hemispheres most consistent with chronic small vessel ischemic disease, mild for age. Mild patchy involvement of the pons. Superimposed tiny remote left cerebellar infarct. No abnormal foci of restricted diffusion to suggest acute or subacute ischemia. Gray-white matter differentiation maintained. No encephalomalacia to suggest chronic cortical infarction. No foci of susceptibility artifact to suggest acute or chronic intracranial hemorrhage. No mass lesion, midline shift or mass effect. No hydrocephalus or extra-axial fluid collection. Incidental note made of an empty sella. Midline structures intact. Vascular: Major intracranial vascular flow voids are maintained. Skull and upper cervical spine: Prominent degenerative thickening noted at the tectorial membrane without significant stenosis. Craniocervical junction otherwise unremarkable. Bone marrow signal intensity within normal limits. No scalp soft tissue abnormality. Sinuses/Orbits: Globes and orbital soft tissues within normal limits. Patient status post bilateral ocular lens replacement. Sequelae of  prior sinus surgery noted. Mild chronic mucoperiosteal thickening noted within the ethmoidal air cells. Trace right mastoid effusion noted, of doubtful significance.  Other: None. IMPRESSION: 1. No acute intracranial infarct or other abnormality. 2. Age-related cerebral atrophy with mild chronic small vessel ischemic disease. Electronically Signed   By: Jeannine Boga M.D.   On: 01/03/2020 22:02   US Carotid Bilateral (at [facility] and [facility] only)  Result Date: 01/04/2020 CLINICAL DATA:  Difficulty finding words. EXAM: BILATERAL CAROTID DUPLEX ULTRASOUND TECHNIQUE: Pearline Cables scale imaging, color Doppler and duplex ultrasound were performed of bilateral carotid and vertebral arteries in the neck. COMPARISON:  None. FINDINGS: Criteria: Quantification of carotid stenosis is based on velocity parameters that correlate the residual internal carotid diameter with NASCET-based stenosis levels, using the diameter of the distal internal carotid lumen as the denominator for stenosis measurement. The following velocity measurements were obtained: RIGHT ICA: 57/18 cm/sec CCA: [id number] cm/sec SYSTOLIC ICA/CCA RATIO:  1.0 ECA: 59 cm/sec LEFT ICA: 100/22 cm/sec CCA: [id number] cm/sec SYSTOLIC ICA/CCA RATIO:  1.9 ECA: 45 cm/sec RIGHT CAROTID ARTERY: Small amount of echogenic plaque at the right carotid bulb. External carotid artery is patent with normal waveform. Question a small amount of heterogeneous plaque in the mid internal carotid artery. Normal waveforms and velocities in the internal carotid artery. RIGHT VERTEBRAL ARTERY: Antegrade flow and normal waveform in the right vertebral artery. LEFT CAROTID ARTERY: Small amount of echogenic plaque at the left carotid bulb. External carotid artery is patent with normal waveform. Small amount of plaque in the proximal internal carotid artery. Normal waveforms and velocities in the internal carotid artery. LEFT VERTEBRAL ARTERY: Antegrade flow and normal waveform in the left vertebral artery. IMPRESSION: Mild atherosclerotic disease in the carotid arteries without significant stenosis. Estimated degree of stenosis in the internal carotid arteries is less  than 50% bilaterally. Patent vertebral arteries with antegrade flow. Electronically Signed   By: Markus Daft M.D.   On: 01/04/2020 08:31   ECHOCARDIOGRAM COMPLETE  Result Date: 01/03/2020    ECHOCARDIOGRAM REPORT   Patient Name:   AMILYN BOBLITT Date of Exam: 01/03/2020 Medical Rec #:  [id number]        Height:       62.0 in Accession #:    [id number]       Weight:       117.0 lb Date of Birth:  1936-07-13       BSA:          1.522 m Patient Age:    84 years         BP:           180/84 mmHg Patient Gender: F                HR:           64 bpm. Exam Location:  [facility] Procedure: 2D Echo, Cardiac Doppler and Color Doppler Indications:     I163.9 Stroke  History:         Patient has no prior history of Echocardiogram examinations.                  Bilateral breast implants; Risk Factors:Hypertension. History                  of breast cancer. COPD. Pneumonia. DVT.  Sonographer:     Wilford Sports Rodgers-Jones Referring Phys:  [id number] AGBATA Diagnosing Phys: Yolonda Kida MD IMPRESSIONS  1. Left ventricular ejection fraction, by estimation, is 60 to  65%. The left ventricle has normal function. The left ventricle has no regional wall motion abnormalities. Left ventricular diastolic parameters are consistent with Grade II diastolic dysfunction (pseudonormalization).  2. Right ventricular systolic function is normal. The right ventricular size is normal.  3. The mitral valve is normal in structure. Mild mitral valve regurgitation.  4. The aortic valve is normal in structure. Aortic valve regurgitation is mild. FINDINGS  Left Ventricle: Left ventricular ejection fraction, by estimation, is 60 to 65%. The left ventricle has normal function. The left ventricle has no regional wall motion abnormalities. The left ventricular internal cavity size was normal in size. There is  no left ventricular hypertrophy. Left ventricular diastolic parameters are consistent with Grade II diastolic dysfunction (pseudonormalization).  Right Ventricle: The right ventricular size is normal. No increase in right ventricular wall thickness. Right ventricular systolic function is normal. Left Atrium: Left atrial size was normal in size. Right Atrium: Right atrial size was normal in size. Pericardium: There is no evidence of pericardial effusion. Mitral Valve: The mitral valve is normal in structure. Mild mitral valve regurgitation. Tricuspid Valve: The tricuspid valve is normal in structure. Tricuspid valve regurgitation is mild. Aortic Valve: The aortic valve is normal in structure. Aortic valve regurgitation is mild. Aortic regurgitation PHT measures 378 msec. Pulmonic Valve: The pulmonic valve was normal in structure. Pulmonic valve regurgitation is not visualized. Aorta: The aortic root is normal in size and structure. IAS/Shunts: The atrial septum is grossly normal.  LEFT VENTRICLE PLAX 2D LVIDd:         4.65 cm  Diastology LVIDs:         3.07 cm  LV e' lateral:   8.05 cm/s LV PW:         1.01 cm  LV E/e' lateral: 5.7 LV IVS:        0.84 cm  LV e' medial:    5.22 cm/s LVOT diam:     1.80 cm  LV E/e' medial:  8.7 LV SV:         51 LV SV Index:   34 LVOT Area:     2.54 cm  RIGHT VENTRICLE RV Basal diam:  3.97 cm RV S prime:     12.90 cm/s TAPSE (M-mode): 2.8 cm LEFT ATRIUM             Index       RIGHT ATRIUM           Index LA diam:        3.70 cm 2.43 cm/m  RA Area:     14.60 cm LA Vol (A2C):   59.9 ml 39.35 ml/m RA Volume:   40.70 ml  26.74 ml/m LA Vol (A4C):   51.7 ml 33.96 ml/m LA Biplane Vol: 56.7 ml 37.25 ml/m  AORTIC VALVE LVOT Vmax:   99.20 cm/s LVOT Vmean:  66.100 cm/s LVOT VTI:    0.202 m AI PHT:      378 msec  AORTA Ao Root diam: 3.40 cm MITRAL VALVE MV Area (PHT): 2.99 cm    SHUNTS MV Decel Time: 254 msec    Systemic VTI:  0.20 m MV E velocity: 45.60 cm/s  Systemic Diam: 1.80 cm MV A velocity: 79.10 cm/s MV E/A ratio:  0.58 Dwayne D Callwood MD Electronically signed by Yolonda Kida MD Signature Date/Time:  01/03/2020/10:35:41 PM    Final     Medications:  I have reviewed the patient's current medications. Scheduled: . amLODipine  2.5 mg Oral  Daily  . ARIPiprazole  5 mg Oral Daily  . aspirin EC  81 mg Oral Daily  . budesonide  3 mg Oral Daily  . clopidogrel  75 mg Oral Daily  . ezetimibe  10 mg Oral Daily  . hydrochlorothiazide  25 mg Oral Daily  . mometasone-formoterol  2 puff Inhalation BID  . omega-3 acid ethyl esters  1,000 mg Oral Daily  . venlafaxine XR  75 mg Oral Daily    Assessment/Plan: 84 y.o. femalewith a medical history significant forHTN, colitis,COPD,fibromyalgia,migraine headaches presenting with speech difficulties that are no longer appreciated on my examination although still present to a small degree per patient.  MRI of the brain personally reviewed and shows no acute changes.  TIA suspected.  BP initially elevated but currently controlled.  Carotid dopplers show no evidence of hemodynamically significant stenosis.  Echocardiogram shows no cardiac source of emboli with an EF of 60-65%.  A1c 5.6, LDL 84.   Plan: 1. Prophylactic therapy-Dual antiplatelet therapy with ASA 81mg  and Plavix 75mg  for three weeks with change to ASA 81mg  daily alone as monotherapy after that time. 2. Aggressive lipid management with target LDL<70. 3. Patient to continue follow up on an outpatient basis with Dr. [facility]ella [facility].  No further neurologic intervention is recommended at this time.  If further questions arise, please call or page at that time.  Thank you for allowing neurology to participate in the care of this patient.    LOS: 0 days   Alexis Goodell, MD Neurology [phone] 01/04/2020  10:42 AM

## 2020-01-08 DIAGNOSIS — G8929 Other chronic pain: Secondary | ICD-10-CM | POA: Diagnosis not present

## 2020-01-08 DIAGNOSIS — K52832 Lymphocytic colitis: Secondary | ICD-10-CM | POA: Diagnosis not present

## 2020-01-08 DIAGNOSIS — Z09 Encounter for follow-up examination after completed treatment for conditions other than malignant neoplasm: Secondary | ICD-10-CM | POA: Diagnosis not present

## 2020-01-08 DIAGNOSIS — M5030 Other cervical disc degeneration, unspecified cervical region: Secondary | ICD-10-CM | POA: Diagnosis not present

## 2020-01-08 DIAGNOSIS — G43119 Migraine with aura, intractable, without status migrainosus: Secondary | ICD-10-CM | POA: Diagnosis not present

## 2020-01-08 DIAGNOSIS — J479 Bronchiectasis, uncomplicated: Secondary | ICD-10-CM | POA: Diagnosis not present

## 2020-01-08 DIAGNOSIS — I10 Essential (primary) hypertension: Secondary | ICD-10-CM | POA: Diagnosis not present

## 2020-01-08 DIAGNOSIS — E876 Hypokalemia: Secondary | ICD-10-CM | POA: Diagnosis not present

## 2020-01-08 DIAGNOSIS — M25562 Pain in left knee: Secondary | ICD-10-CM | POA: Diagnosis not present

## 2020-01-08 DIAGNOSIS — F3340 Major depressive disorder, recurrent, in remission, unspecified: Secondary | ICD-10-CM | POA: Diagnosis not present

## 2020-01-08 DIAGNOSIS — G459 Transient cerebral ischemic attack, unspecified: Secondary | ICD-10-CM | POA: Diagnosis not present

## 2020-01-10 DIAGNOSIS — R2689 Other abnormalities of gait and mobility: Secondary | ICD-10-CM | POA: Diagnosis not present

## 2020-01-10 DIAGNOSIS — M6281 Muscle weakness (generalized): Secondary | ICD-10-CM | POA: Diagnosis not present

## 2020-01-10 DIAGNOSIS — Z9181 History of falling: Secondary | ICD-10-CM | POA: Diagnosis not present

## 2020-01-10 DIAGNOSIS — R41841 Cognitive communication deficit: Secondary | ICD-10-CM | POA: Diagnosis not present

## 2020-01-10 DIAGNOSIS — R279 Unspecified lack of coordination: Secondary | ICD-10-CM | POA: Diagnosis not present

## 2020-01-10 DIAGNOSIS — M25541 Pain in joints of right hand: Secondary | ICD-10-CM | POA: Diagnosis not present

## 2020-01-10 DIAGNOSIS — M25562 Pain in left knee: Secondary | ICD-10-CM | POA: Diagnosis not present

## 2020-01-10 DIAGNOSIS — M25542 Pain in joints of left hand: Secondary | ICD-10-CM | POA: Diagnosis not present

## 2020-01-10 DIAGNOSIS — R4701 Aphasia: Secondary | ICD-10-CM | POA: Diagnosis not present

## 2020-01-12 DIAGNOSIS — G43119 Migraine with aura, intractable, without status migrainosus: Secondary | ICD-10-CM | POA: Diagnosis not present

## 2020-01-12 DIAGNOSIS — M5030 Other cervical disc degeneration, unspecified cervical region: Secondary | ICD-10-CM | POA: Diagnosis not present

## 2020-01-12 DIAGNOSIS — R4189 Other symptoms and signs involving cognitive functions and awareness: Secondary | ICD-10-CM | POA: Diagnosis not present

## 2020-01-15 DIAGNOSIS — R2689 Other abnormalities of gait and mobility: Secondary | ICD-10-CM | POA: Diagnosis not present

## 2020-01-15 DIAGNOSIS — R4701 Aphasia: Secondary | ICD-10-CM | POA: Diagnosis not present

## 2020-01-15 DIAGNOSIS — R41841 Cognitive communication deficit: Secondary | ICD-10-CM | POA: Diagnosis not present

## 2020-01-15 DIAGNOSIS — M25541 Pain in joints of right hand: Secondary | ICD-10-CM | POA: Diagnosis not present

## 2020-01-15 DIAGNOSIS — M25542 Pain in joints of left hand: Secondary | ICD-10-CM | POA: Diagnosis not present

## 2020-01-15 DIAGNOSIS — M6281 Muscle weakness (generalized): Secondary | ICD-10-CM | POA: Diagnosis not present

## 2020-01-15 DIAGNOSIS — Z9181 History of falling: Secondary | ICD-10-CM | POA: Diagnosis not present

## 2020-01-15 DIAGNOSIS — M25562 Pain in left knee: Secondary | ICD-10-CM | POA: Diagnosis not present

## 2020-01-15 DIAGNOSIS — R279 Unspecified lack of coordination: Secondary | ICD-10-CM | POA: Diagnosis not present

## 2020-01-16 ENCOUNTER — Other Ambulatory Visit: Payer: Self-pay

## 2020-01-16 ENCOUNTER — Ambulatory Visit (INDEPENDENT_AMBULATORY_CARE_PROVIDER_SITE_OTHER): Payer: PPO | Admitting: Physician Assistant

## 2020-01-16 ENCOUNTER — Encounter: Payer: Self-pay | Admitting: Physician Assistant

## 2020-01-16 VITALS — BP 158/90 | HR 73 | Ht 62.0 in | Wt 119.4 lb

## 2020-01-16 DIAGNOSIS — I10 Essential (primary) hypertension: Secondary | ICD-10-CM

## 2020-01-16 DIAGNOSIS — Z79899 Other long term (current) drug therapy: Secondary | ICD-10-CM

## 2020-01-16 DIAGNOSIS — I6529 Occlusion and stenosis of unspecified carotid artery: Secondary | ICD-10-CM | POA: Diagnosis not present

## 2020-01-16 DIAGNOSIS — Z86718 Personal history of other venous thrombosis and embolism: Secondary | ICD-10-CM

## 2020-01-16 DIAGNOSIS — I739 Peripheral vascular disease, unspecified: Secondary | ICD-10-CM | POA: Diagnosis not present

## 2020-01-16 DIAGNOSIS — R0989 Other specified symptoms and signs involving the circulatory and respiratory systems: Secondary | ICD-10-CM | POA: Diagnosis not present

## 2020-01-16 DIAGNOSIS — I452 Bifascicular block: Secondary | ICD-10-CM | POA: Diagnosis not present

## 2020-01-16 MED ORDER — LOSARTAN POTASSIUM 25 MG PO TABS: 25.0000 mg | ORAL_TABLET | Freq: Every day | ORAL | 4 refills | Status: DC

## 2020-01-16 NOTE — Patient Instructions (Signed)
Medication Instructions:  Your physician has recommended you make the following change in your medication:  1- START Losartan 25 mg by mouth once a day.   *If you need a refill on your cardiac medications before your next appointment, please call your pharmacy*  Lab Work: Your physician recommends that you return for lab work in: 1 week on 01/23/20 at the [facility] for BMET. - Please go to the [facility]. You will check in at the front desk to the right as you walk into the atrium. Valet Parking is offered if needed. - No appointment needed. You may go any day between 7 am and 6 pm.   If you have labs (blood work) drawn today and your tests are completely normal, you will receive your results only by: Marland Kitchen MyChart Message (if you have MyChart) OR . A paper copy in the mail If you have any lab test that is abnormal or we need to change your treatment, we will call you to review the results.  Testing/Procedures: none  Follow-Up: At [facility], you and your health needs are our priority.  As part of our continuing mission to provide you with exceptional heart care, we have created designated Provider Care Teams.  These Care Teams include your primary Cardiologist (physician) and Advanced Practice Providers (APPs -  Physician Assistants and Nurse Practitioners) who all work together to provide you with the care you need, when you need it.  We recommend signing up for the patient portal called "MyChart".  Sign up information is provided on this After Visit Summary.  MyChart is used to connect with patients for Virtual Visits (Telemedicine).  Patients are able to view lab/test results, encounter notes, upcoming appointments, etc.  Non-urgent messages can be sent to your provider as well.   To learn more about what you can do with MyChart, go to [email or web address].    Your next appointment:   3 week(s)  The format for your next appointment:   In Person  Provider:    You  may see Ida Rogue, MD or one of the following Advanced Practice Providers on your designated Care Team:    Murray Hodgkins, NP  Christell Faith, PA-C  Marrianne Mood, PA-C   Other Instructions Call us if you experience dizziness or Systolic Blood pressure (top number) consistently greater than 160.

## 2020-01-16 NOTE — Progress Notes (Signed)
Office Visit    Patient Name: Lindsey [name] Date of Encounter: 01/16/2020  Primary Care Provider:  Tracie Harrier, MD Primary Cardiologist:  Ida Rogue, MD  Chief Complaint    Chief Complaint  Patient presents with  . office visit    Patient reports elevated BP and bradycardia; Meds verbally reviewed with patient.   84 yo female with history of venous insufficiency, history of R knee surgery and RLE DVT s/p IVC filter, bronchiectasis, back surgery, labile blood pressure, and presenting today due to recent concern for bradycardia and elevated BP.   Past Medical History    Past Medical History:  Diagnosis Date  . Anemia   . Asthma   . Breast cancer ([place])   . Bronchiectasis ([place]) 06/2007  . Cancer ([place])   . Colitis   . COPD (chronic obstructive pulmonary disease) ([place])   . Depression   . DVT (deep venous thrombosis) ([place])    after her right knee surgery.   . Fibromyalgia   . Fibromyalgia   . Hypertension   . Migraine headache with aura   . Osteoarthritis   . Osteoporosis   . Pneumonia   . Polio 1952   Past Surgical History:  Procedure Laterality Date  . ABDOMINAL HYSTERECTOMY    . BACK SURGERY    . BILATERAL TOTAL MASTECTOMY WITH AXILLARY LYMPH NODE DISSECTION    . CARPAL TUNNEL RELEASE     bilateral   . COLONOSCOPY WITH PROPOFOL N/A 08/02/2015   Procedure: COLONOSCOPY WITH PROPOFOL;  Surgeon: Manya Silvas, MD;  Location: [facility] ENDOSCOPY;  Service: Endoscopy;  Laterality: N/A;  . FINGER TENDON REPAIR    . HERNIA REPAIR    . REPLACEMENT TOTAL KNEE     right knee   . SQUAMOUS CELL CARCINOMA EXCISION    . TONSILLECTOMY    . UMBILICAL HERNIA REPAIR    . VAGINAL HYSTERECTOMY      Allergies  Allergies  Allergen Reactions  . Ivp Dye [Iodinated Diagnostic Agents] Anaphylaxis  . Codeine     Dizziness   . Sulfa Antibiotics Hives  . Tegaderm Ag Mesh [Silver]   . Tetracyclines & Related     Stomach upset     History of Present Illness      Lindsey [name] is a 84 y.o. female with PMH as above and including a long history of labile BP. Of note, she is a retired Electrical engineer. She reports baseline LEE that has recently worsened on amlodipine 2.5mg . She reports recent stable DOE/SOB. She has had many medication changes recently and expresses some frustration regarding keeping track of these changes today. She was previously on Losartan 100mg , which appears to have been discontinued with preference for alternative ARB (Telmisartan), which was never started and thus her ARB fell off of her medication list with recommendations as below. She has been tracking her BP in a log (brought with her to clinic today) and checks her BP with a wrist cuff that has recently been checked and consistent with our clinic pressures. Her home BP log was reviewed with rare reported SBP above 170, at which time she takes a hydralazine. Her average BP remains above 130/80, which we discussed as the upper limit of recommend goal pressures moving forward. We discussed her headaches / migraines today and suspicion that they may be triggered by her elevated BP.  We reviewed diet / salt intake, as well as fluid intake. Also reviewed was activity and sleep with concern for  possible contribution of undiagnosed sleep apnea, given her morning headaches and fatigue. No recent SOB or DOE. No current presyncope but she notes a history of presyncope with both high and low pressures. Most recent dizziness occurred within the last 24 hours with home log showing SBP high. No recent falls or syncope. No orthopnea, PND, abdominal distention, or early satiety. No s/sx of bleeding. No reported chest discomfort, racing HR, or palpitations with elevated BP. She reports a desire to discontinue her statin but will continue Zetia. Also noted today was concern for bradycardia with EKG showing HR 73bpm and bifascicular block.  Home Medications    Prior to Admission medications   Medication  Sig Start Date End Date Taking? Authorizing Provider  albuterol (PROVENTIL HFA;VENTOLIN HFA) 108 (90 Base) MCG/ACT inhaler Inhale into the lungs every 6 (six) hours as needed for wheezing or shortness of breath.    [provider]  amLODipine (NORVASC) 2.5 MG tablet Take 1 tablet (2.5 mg total) by mouth daily. 11/22/19   Minna Merritts, MD  ARIPiprazole (ABILIFY) 2 MG tablet Take 2 mg by mouth daily.     [provider]  aspirin EC 81 MG EC tablet Take 1 tablet (81 mg total) by mouth daily. 01/04/20   Patrecia Pour, MD  atorvastatin (LIPITOR) 40 MG tablet Take 1 tablet (40 mg total) by mouth daily. 01/04/20 02/03/20  Patrecia Pour, MD  budesonide (ENTOCORT EC) 3 MG 24 hr capsule Take 9 mg by mouth daily.     [provider]  clopidogrel (PLAVIX) 75 MG tablet Take 1 tablet (75 mg total) by mouth daily for 21 days. 01/04/20 01/25/20  Patrecia Pour, MD  doxazosin (CARDURA) 4 MG tablet TAKE 1/2 TABLET BY MOUTH TWICE DAILY Patient taking differently: Take 2 mg by mouth 2 (two) times daily.  12/20/18   Minna Merritts, MD  ezetimibe (ZETIA) 10 MG tablet Take 1 tablet (10 mg total) by mouth daily. 11/22/19   Minna Merritts, MD  hydrALAZINE (APRESOLINE) 25 MG tablet Take 1 tablet (25 mg total) by mouth as needed (for BP >170). 09/25/19   Loel Dubonnet, NP  hydrochlorothiazide (HYDRODIURIL) 25 MG tablet TAKE 1 TABLET BY MOUTH DAILY Patient taking differently: Take 25 mg by mouth daily.  12/20/18   Minna Merritts, MD  hydrOXYzine (ATARAX/VISTARIL) 25 MG tablet Take 12.5 mg by mouth 3 (three) times daily as needed for anxiety.     [provider]  NON FORMULARY Take 2 capsules by mouth in the morning and at bedtime. Algaecal    [provider]  NON FORMULARY Take 2 capsules by mouth daily. Strontium Boost    [provider]  Omega-3 Fatty Acids (FISH OIL) 1000 MG CAPS Take 1,000 mg by mouth daily.    [provider]  polyethylene glycol (MIRALAX  / GLYCOLAX) packet Take 17 g by mouth daily as needed for mild constipation or moderate constipation.     [provider]  Red Yeast Rice Extract (RED YEAST RICE PO) Take 2 capsules by mouth daily.    [provider]  Rimegepant Sulfate (NURTEC) 75 MG TBDP Take 75 mg by mouth daily as needed (headache).     [provider]  SYMBICORT 160-4.5 MCG/ACT inhaler Inhale 2 puffs into the lungs in the morning and at bedtime.     [provider]  venlafaxine XR (EFFEXOR-XR) 75 MG 24 hr capsule Take 75 mg by mouth daily with breakfast.  [provider]    Review of Systems    She denies chest pain, pnd, orthopnea, n, v, syncope, weight gain, or early satiety. She reports headaches / migraines, presyncope/dizziness, and LE edema as above. No recent DOE/SOB despite known lung dz. She reports fatigue. All other systems reviewed and are otherwise negative except as noted above.  Physical Exam    VS:  BP (!) 158/90 (BP Location: Left Arm, Patient Position: Sitting, Cuff Size: Normal)   Pulse 73   Ht 5\' 2"  (1.575 m)   Wt 119 lb 6 oz (54.1 kg)   SpO2 98%   BMI 21.83 kg/m  , BMI Body mass index is 21.83 kg/m. GEN: Well nourished, well developed, in no acute distress. HEENT: normal. Neck: Supple, no JVD, carotid bruits, or masses. Cardiac: RRR, no murmurs, rubs, or gallops. No clubbing, cyanosis. Trace LEE. Radials/DP/PT 2+ and equal bilaterally.  Respiratory:  Respirations regular and unlabored, clear to auscultation bilaterally. GI: Soft, nontender, nondistended, BS + x 4. MS: no deformity or atrophy. Skin: warm and dry, no rash. Neuro:  Strength and sensation are intact. Psych: Normal affect.  Accessory Clinical Findings    ECG personally reviewed by me today - NSR, 73 bpm, RBBB, LAFB, bifascicular block noted on previous EKGs, PACs versus sinus arrhythmia with significant artifact, LVH with repolarization abnormalities- no acute changes.  VITALS  Reviewed today   Temp Readings from Last 3 Encounters:  01/04/20 (!) 97.4 F (36.3 C) (Oral)  09/22/19 98.3 F (36.8 C)  09/20/19 97.9 F (36.6 C) (Oral)   BP Readings from Last 3 Encounters:  01/16/20 (!) 158/90  01/04/20 (!) 157/96  11/22/19 124/84   Pulse Readings from Last 3 Encounters:  01/16/20 73  01/04/20 61  11/22/19 86    Wt Readings from Last 3 Encounters:  01/16/20 119 lb 6 oz (54.1 kg)  01/03/20 117 lb (53.1 kg)  11/22/19 119 lb 12 oz (54.3 kg)     LABS  reviewed today   CareEverwhere Labs present? Yes/No: No  Lab Results  Component Value Date   WBC 5.9 01/03/2020   HGB 14.0 01/03/2020   HCT 42.7 01/03/2020   MCV 91.6 01/03/2020   PLT 230 01/03/2020   Lab Results  Component Value Date   CREATININE 0.64 01/03/2020   BUN 14 01/03/2020   NA 133 (L) 01/03/2020   K 3.3 (L) 01/03/2020   CL 93 (L) 01/03/2020   CO2 30 01/03/2020   Lab Results  Component Value Date   ALT 28 09/22/2019   AST 54 (H) 09/22/2019   ALKPHOS 53 09/22/2019   BILITOT 0.8 09/22/2019   Lab Results  Component Value Date   CHOL 204 (H) 01/04/2020   HDL 113 01/04/2020   LDLCALC 84 01/04/2020   TRIG 37 01/04/2020   CHOLHDL 1.8 01/04/2020    Lab Results  Component Value Date   HGBA1C 5.6 01/04/2020   No results found for: TSH   STUDIES/PROCEDURES reviewed today   Echo 12/2019 1. Left ventricular ejection fraction, by estimation, is 60 to 65%. The  left ventricle has normal function. The left ventricle has no regional  wall motion abnormalities. Left ventricular diastolic parameters are  consistent with Grade II diastolic  dysfunction (pseudonormalization).  2. Right ventricular systolic function is normal. The right ventricular  size is normal.  3. The mitral valve is normal in structure. Mild mitral valve  regurgitation.  4. The aortic valve is normal in structure. Aortic valve regurgitation is  mild.   Carotids 01/2020 IMPRESSION: Mild atherosclerotic  disease in the carotid arteries without significant stenosis. Estimated degree of stenosis in the internal carotid arteries is less than 50% bilaterally.  Assessment & Plan    Labile BP / Poorly controlled HTN --Poorly controlled today. BP home log reviewed with recent lowest SBP 120s, average SBP 140-150s, and rare SBP over 170 with PRN hydralazine taken at that time. Average home DBP mid 80s-90s. Many medication changes noted over the past few months. She is no longer taking an ARB with recommendation to restart and patient agreement. Will restart Losartan 25mg  daily with recheck of BMET. Escalate as tolerated. She will continue to log home BP with her wrist cuff (vs brachial cuff) as checked and consistent with office readings. Increase in amlodipine today politely declined by patient, given she reports increased LEE with low dose amlodipine and prefers to avoid further increase in this swelling. Continue PRN hydralazine for pressures over [id number] systolic. Low salt diet and fluid restriction reivewed in detail.   History of dizziness --Ongoing report of dizziness with both high and low pressures and most recent episode within the last 24 hours. Unclear if she is spinning versus the room. Not positional or alleviated with an Epley maneuver. No recent syncope or mechanical falls. No bruit heard on exam with recent carotid study as above.  Future considerations include trial of discontinuation of amlodipine to see if improved sx given bifascicular block on EKG, though noted on previous tracings and will continue to monitor on CCB. Labs show stable Hgb on most recent CBC. Also noted is recent hypokalemia with K 3.3 on labs last month with recommendation for recheck of BMET to ensure K at goal 4.0. Sodium also low as noted above in labs and will recheck with BMET. Future considerations could include Zio, given concern for bradycardia.   Hypokalemia, hyponatremia --Repeat BMET with further recommendations at  that time. Previous K as above low at 3.3 with goal 4.0.   HLD, poorly controlled, LDL goal <70 --Lipids poorly controlled as above with total cholesterol above 200 and LDL not at goal <70. Patient preference to discontinue statin. Agreeable to restart Zetia today.  Recheck lipids in 6-8 weeks and consider referral to lipid clinic if needed. Recommendation for ongoing ASA and cholesterol control with Zetia given vascular dz.   Carotid dz, mild --No amaurosis fugax with migraine visual aura as below described today as differed than that associated with carotid dz. Most recent carotid study as above. No bruit on exam. Continue to monitor. Continue ASA and Zetia (statin discontinued per patient preference). BP and LDL control recommended.   Venous insufficiency  Chronic LEE --Reports worsening in LEE with low dose amlodipine and thus declines increased dose to avoid further increase in swelling. Given bifascicular block, recommend caution with AV nodal blocking agents in general going forward. Leg elevation and compression stockings discussed. Reviewed lifestyle changes.  Migraine --Reports likelihood / suspicion her headaches could be related to her elevated BP. Reports a visual aura before her migraines and often correspond with elevated pressures. F/u as scheduled with neurology. BP control as above. Also considered is future sleep study as above in HPI if ongoing sx of HA and fatigue, given worse in AM.  Bronchiectasis --No recent SOB/DOE. F/u as scheduled / per pulmonology.  Reported bradycardia Bifascicular block, known  --Continue to monitor. Consider Zio monitor if ongoing concern of dizziness and bradycardia. No medication changes with future considerations to include trial discontinuation of  amlodipine as above.  Medication changes: Restart losartan at 25mg  with plan to increase as renal function allows for optimal BP support. Check electrolytes with repletion if needed. Labs ordered:  BMET. Studies / Imaging ordered: None. Future considerations: Sleep study, trial off of amlodipine, Zio. Disposition: RTC 3 weeks  Arvil Chaco, PA-C 01/16/2020

## 2020-01-17 DIAGNOSIS — R2689 Other abnormalities of gait and mobility: Secondary | ICD-10-CM | POA: Diagnosis not present

## 2020-01-17 DIAGNOSIS — R279 Unspecified lack of coordination: Secondary | ICD-10-CM | POA: Diagnosis not present

## 2020-01-17 DIAGNOSIS — M25562 Pain in left knee: Secondary | ICD-10-CM | POA: Diagnosis not present

## 2020-01-17 DIAGNOSIS — M6281 Muscle weakness (generalized): Secondary | ICD-10-CM | POA: Diagnosis not present

## 2020-01-17 DIAGNOSIS — Z9181 History of falling: Secondary | ICD-10-CM | POA: Diagnosis not present

## 2020-01-17 DIAGNOSIS — M25541 Pain in joints of right hand: Secondary | ICD-10-CM | POA: Diagnosis not present

## 2020-01-17 DIAGNOSIS — M25542 Pain in joints of left hand: Secondary | ICD-10-CM | POA: Diagnosis not present

## 2020-01-23 ENCOUNTER — Other Ambulatory Visit: Payer: Self-pay

## 2020-01-23 ENCOUNTER — Telehealth: Payer: Self-pay | Admitting: Cardiovascular Disease

## 2020-01-23 NOTE — Telephone Encounter (Signed)
Please call to discuss BP medications.

## 2020-01-23 NOTE — Telephone Encounter (Signed)
Left voicemail message to call back  

## 2020-01-24 DIAGNOSIS — R41841 Cognitive communication deficit: Secondary | ICD-10-CM | POA: Diagnosis not present

## 2020-01-24 DIAGNOSIS — Z9181 History of falling: Secondary | ICD-10-CM | POA: Diagnosis not present

## 2020-01-24 DIAGNOSIS — R4701 Aphasia: Secondary | ICD-10-CM | POA: Diagnosis not present

## 2020-01-24 DIAGNOSIS — E559 Vitamin D deficiency, unspecified: Secondary | ICD-10-CM | POA: Diagnosis not present

## 2020-01-24 DIAGNOSIS — M810 Age-related osteoporosis without current pathological fracture: Secondary | ICD-10-CM | POA: Diagnosis not present

## 2020-01-24 NOTE — Telephone Encounter (Signed)
Patient returning call.

## 2020-01-24 NOTE — Telephone Encounter (Signed)
Call to patient to review complaint. Pt reports she was on HCTZ, losartan, norvasc and cardura for BP control. Hydralazine used for breakthrough HTN PRN.   Pt has been out of norvasc for 2 days, ran out. She reports that she has been feeling so much better for those 2 days. Swelling has improved and has much more energy.   Pt asking whether we can discontinue norvasc and possibly replace with something else.   4/7 10 AM 120/64, HR 40 4/18 Before med 116/83, HR 105 After med 116/70, HR 76 Evening 114/67, HR "registered low" 4/20 Before med 156/103, HR81 After med 145/93, HR75 Evening 155/104, HR 75 4/21 After meds 10 AM  129/77, HR 78  Routing to provider to further advise.

## 2020-01-26 ENCOUNTER — Other Ambulatory Visit: Payer: Self-pay

## 2020-01-26 ENCOUNTER — Encounter
Admission: RE | Admit: 2020-01-26 | Discharge: 2020-01-26 | Disposition: A | Discharge status: Home / Self Care | Payer: PPO | Source: Ambulatory Visit | Attending: Internal Medicine | Admitting: Internal Medicine

## 2020-01-26 ENCOUNTER — Emergency Department
Admission: EM | Admit: 2020-01-26 | Discharge: 2020-01-26 | Disposition: A | Discharge status: Home / Self Care | Payer: PPO | Attending: Emergency Medicine | Admitting: Emergency Medicine

## 2020-01-26 DIAGNOSIS — R52 Pain, unspecified: Secondary | ICD-10-CM | POA: Diagnosis not present

## 2020-01-26 DIAGNOSIS — R279 Unspecified lack of coordination: Secondary | ICD-10-CM | POA: Diagnosis not present

## 2020-01-26 DIAGNOSIS — I10 Essential (primary) hypertension: Secondary | ICD-10-CM | POA: Diagnosis not present

## 2020-01-26 DIAGNOSIS — J449 Chronic obstructive pulmonary disease, unspecified: Secondary | ICD-10-CM | POA: Diagnosis not present

## 2020-01-26 DIAGNOSIS — M2550 Pain in unspecified joint: Secondary | ICD-10-CM | POA: Diagnosis not present

## 2020-01-26 DIAGNOSIS — R531 Weakness: Secondary | ICD-10-CM | POA: Diagnosis not present

## 2020-01-26 DIAGNOSIS — M79605 Pain in left leg: Secondary | ICD-10-CM | POA: Diagnosis not present

## 2020-01-26 DIAGNOSIS — Z20822 Contact with and (suspected) exposure to covid-19: Secondary | ICD-10-CM | POA: Diagnosis not present

## 2020-01-26 DIAGNOSIS — Z743 Need for continuous supervision: Secondary | ICD-10-CM | POA: Diagnosis not present

## 2020-01-26 DIAGNOSIS — R5381 Other malaise: Secondary | ICD-10-CM | POA: Diagnosis not present

## 2020-01-26 LAB — COMPREHENSIVE METABOLIC PANEL
ALT: 28 U/L (ref 0–44)
AST: 52 U/L — ABNORMAL HIGH (ref 15–41)
Albumin: 4 g/dL (ref 3.5–5.0)
Alkaline Phosphatase: 49 U/L (ref 38–126)
Anion gap: 11 (ref 5–15)
BUN: 16 mg/dL (ref 8–23)
CO2: 30 mmol/L (ref 22–32)
Calcium: 9 mg/dL (ref 8.9–10.3)
Chloride: 90 mmol/L — ABNORMAL LOW (ref 98–111)
Creatinine, Ser: 0.59 mg/dL (ref 0.44–1.00)
GFR calc Af Amer: >60 mL/min (ref >60)
GFR calc non Af Amer: >60 mL/min (ref >60)
Glucose, Bld: 122 mg/dL — ABNORMAL HIGH (ref 70–99)
Potassium: 3.2 mmol/L — ABNORMAL LOW (ref 3.5–5.1)
Sodium: 131 mmol/L — ABNORMAL LOW (ref 135–145)
Total Bilirubin: 1 mg/dL (ref 0.3–1.2)
Total Protein: 6.5 g/dL (ref 6.5–8.1)

## 2020-01-26 LAB — CBC WITH DIFFERENTIAL/PLATELET
Abs Immature Granulocytes: 0.04 10*3/uL (ref 0.00–0.07)
Basophils Absolute: 0 10*3/uL (ref 0.0–0.1)
Basophils Relative: 0 %
Eosinophils Absolute: 0 10*3/uL (ref 0.0–0.5)
Eosinophils Relative: 0 %
HCT: 41 % (ref 36.0–46.0)
Hemoglobin: 13.9 g/dL (ref 12.0–15.0)
Immature Granulocytes: 1 %
Lymphocytes Relative: 5 %
Lymphs Abs: 0.4 10*3/uL — ABNORMAL LOW (ref 0.7–4.0)
MCH: 30.3 pg (ref 26.0–34.0)
MCHC: 33.9 g/dL (ref 30.0–36.0)
MCV: 89.5 fL (ref 80.0–100.0)
Monocytes Absolute: 0.9 10*3/uL (ref 0.1–1.0)
Monocytes Relative: 13 %
Neutro Abs: 5.8 10*3/uL (ref 1.7–7.7)
Neutrophils Relative %: 81 %
Platelets: 194 10*3/uL (ref 150–400)
RBC: 4.58 MIL/uL (ref 3.87–5.11)
RDW: 13.2 % (ref 11.5–15.5)
WBC: 7.1 10*3/uL (ref 4.0–10.5)
nRBC: 0 % (ref 0.0–0.2)

## 2020-01-26 LAB — RESPIRATORY PANEL BY RT PCR (FLU A&B, COVID)
Influenza A by PCR: NEGATIVE
Influenza B by PCR: NEGATIVE
SARS Coronavirus 2 by RT PCR: NEGATIVE

## 2020-01-26 LAB — TROPONIN I (HIGH SENSITIVITY): Troponin I (High Sensitivity): 10 ng/L (ref <18)

## 2020-01-26 MED ORDER — DOCUSATE SODIUM 100 MG PO CAPS
100.0000 mg | ORAL_CAPSULE | Freq: Once | ORAL | Status: AC
  Administered 2020-01-26: 100 mg via ORAL
  Filled 2020-01-26: qty 1

## 2020-01-26 MED ORDER — ONDANSETRON HCL 4 MG/2ML IJ SOLN
4.0000 mg | Freq: Once | INTRAMUSCULAR | Status: AC
  Administered 2020-01-26: 4 mg via INTRAVENOUS
  Filled 2020-01-26: qty 2

## 2020-01-26 MED ORDER — OXYCODONE-ACETAMINOPHEN 5-325 MG PO TABS: 1.0000 | ORAL_TABLET | Freq: Three times a day (TID) | ORAL | 0 refills | Status: DC | PRN

## 2020-01-26 MED ORDER — OXYCODONE-ACETAMINOPHEN 5-325 MG PO TABS
2.0000 | ORAL_TABLET | Freq: Once | ORAL | Status: DC
  Filled 2020-01-26: qty 2

## 2020-01-26 MED ORDER — MORPHINE SULFATE (PF) 4 MG/ML IV SOLN
4.0000 mg | Freq: Once | INTRAVENOUS | Status: AC
  Administered 2020-01-26: 4 mg via INTRAVENOUS
  Filled 2020-01-26: qty 1

## 2020-01-26 MED ORDER — HYDROMORPHONE HCL 2 MG PO TABS
2.0000 mg | ORAL_TABLET | Freq: Once | ORAL | Status: AC
  Administered 2020-01-26: 2 mg via ORAL
  Filled 2020-01-26: qty 1

## 2020-01-26 MED ORDER — DOCUSATE SODIUM 100 MG PO CAPS
100.0000 mg | ORAL_CAPSULE | Freq: Two times a day (BID) | ORAL | 2 refills | Status: DC
Start: 2020-01-26 — End: 2020-07-16

## 2020-01-26 MED ORDER — HYDROMORPHONE HCL 2 MG PO TABS: 1.0000 mg | ORAL_TABLET | Freq: Two times a day (BID) | ORAL | 0 refills | Status: AC | PRN

## 2020-01-26 MED ORDER — SODIUM CHLORIDE 0.9 % IV BOLUS
500.0000 mL | Freq: Once | INTRAVENOUS | Status: AC
  Administered 2020-01-26: 09:00:00 500 mL via INTRAVENOUS

## 2020-01-26 NOTE — ED Provider Notes (Signed)
[facility] Emergency Department Provider Note       Time seen: ----------------------------------------- 9:17 AM on 01/26/2020 -----------------------------------------   I have reviewed the triage vital signs and the nursing notes.  HISTORY   Chief Complaint Fatigue   HPI Lindsey [name] is a 84 y.o. female with a history of anemia, asthma, colitis, COPD, DVT, fibromyalgia who presents to the ED for weakness.  Patient states she is lost approximately 75% of her strength in her leg since she had a stem cell injection yesterday.  This was done in [facility].  She had stem cell injection in her left knee and then bilateral hips.  Main complaint is weakness in 8 out of 10 in both hips.  She is unable to stand at this time.  Past Medical History:  Diagnosis Date  . Anemia   . Asthma   . Breast cancer ([place])   . Bronchiectasis ([place]) 06/2007  . Cancer ([place])   . Colitis   . COPD (chronic obstructive pulmonary disease) ([place])   . Depression   . DVT (deep venous thrombosis) ([place])    after her right knee surgery.   . Fibromyalgia   . Fibromyalgia   . Hypertension   . Migraine headache with aura   . Osteoarthritis   . Osteoporosis   . Pneumonia   . Polio 1952    Patient Active Problem List   Diagnosis Date Noted  . TIA (transient ischemic attack) 01/03/2020  . Migraine headache with aura 01/03/2020  . Essential hypertension 01/03/2020  . Depression 01/03/2020  . COPD with chronic bronchitis ([place]) 01/03/2020  . Hypokalemia 01/03/2020  . SOB (shortness of breath) 04/30/2014  . Leg swelling 04/30/2014  . Bronchiectasis without complication ([place]) [id number]  . History of DVT (deep vein thrombosis) 04/30/2014  . Labile blood pressure 04/30/2014  . S/P IVC filter 04/30/2014  . History of right knee surgery 04/30/2014    Past Surgical History:  Procedure Laterality Date  . ABDOMINAL HYSTERECTOMY    . BACK SURGERY    . BILATERAL TOTAL  MASTECTOMY WITH AXILLARY LYMPH NODE DISSECTION    . CARPAL TUNNEL RELEASE     bilateral   . COLONOSCOPY WITH PROPOFOL N/A 08/02/2015   Procedure: COLONOSCOPY WITH PROPOFOL;  Surgeon: Manya Silvas, MD;  Location: [facility] ENDOSCOPY;  Service: Endoscopy;  Laterality: N/A;  . FINGER TENDON REPAIR    . HERNIA REPAIR    . REPLACEMENT TOTAL KNEE     right knee   . SQUAMOUS CELL CARCINOMA EXCISION    . TONSILLECTOMY    . UMBILICAL HERNIA REPAIR    . VAGINAL HYSTERECTOMY      Allergies Ivp dye [iodinated diagnostic agents], Codeine, Sulfa antibiotics, Tegaderm ag mesh [silver], and Tetracyclines & related  Social History Social History   Tobacco Use  . Smoking status: Never Smoker  . Smokeless tobacco: Never Used  Substance Use Topics  . Alcohol use: Not Currently    Alcohol/week: 1.0 standard drinks    Types: 1 Glasses of wine per week  . Drug use: No    Review of Systems Constitutional: Negative for fever. Cardiovascular: Negative for chest pain. Respiratory: Negative for shortness of breath. Gastrointestinal: Negative for abdominal pain, vomiting and diarrhea. Musculoskeletal: Positive for hip pain Skin: Negative for rash. Neurological: Negative for headaches, positive for weakness  All systems negative/normal/unremarkable except as stated in the HPI  ____________________________________________   PHYSICAL EXAM:  VITAL SIGNS: ED Triage Vitals  Enc Vitals Group  BP 01/26/20 0914 (!) 150/71     Pulse Rate 01/26/20 0914 70     Resp 01/26/20 0914 13     Temp 01/26/20 0914 98.3 F (36.8 C)     Temp Source 01/26/20 0914 Oral     SpO2 01/26/20 0914 97 %     Weight 01/26/20 0910 117 lb (53.1 kg)     Height 01/26/20 0910 5\' 2"  (1.575 m)     Head Circumference --      Peak Flow --      Pain Score 01/26/20 0909 8     Pain Loc --      Pain Edu? --      Excl. in [place]? --     Constitutional: Alert and oriented. Well appearing and in no distress. Eyes: Conjunctivae  are normal. Normal extraocular movements. Cardiovascular: Normal rate, regular rhythm. No murmurs, rubs, or gallops. Respiratory: Normal respiratory effort without tachypnea nor retractions. Breath sounds are clear and equal bilaterally. No wheezes/rales/rhonchi. Gastrointestinal: Soft and nontender. Normal bowel sounds Musculoskeletal: Pain with range of motion of the lower extremities, some arthritis is noted.  Injection site is noted over the superior lateral aspect of the left knee and anterior medially over both hips Neurologic:  Normal speech and language. No gross focal neurologic deficits are appreciated.  Skin:  Skin is warm, dry and intact. No rash noted. Psychiatric: Mood and affect are normal. Speech and behavior are normal.  ____________________________________________  ED COURSE:  As part of my medical decision making, I reviewed the following data within the [place] History obtained from family if available, nursing notes, old chart and ekg, as well as notes from prior ED visits. Patient presented for weakness after stem cell injection, we will assess with labs and imaging as indicated at this time.   Procedures  Lindsey [name] was evaluated in Emergency Department on 01/26/2020 for the symptoms described in the history of present illness. She was evaluated in the context of the global COVID-19 pandemic, which necessitated consideration that the patient might be at risk for infection with the SARS-CoV-2 virus that causes COVID-19. Institutional protocols and algorithms that pertain to the evaluation of patients at risk for COVID-19 are in a state of rapid change based on information released by regulatory bodies including the CDC and federal and state organizations. These policies and algorithms were followed during the patient's care in the ED.  ____________________________________________   LABS (pertinent positives/negatives)  Labs Reviewed  CBC WITH  DIFFERENTIAL/PLATELET - Abnormal; Notable for the following components:      Result Value   Lymphs Abs 0.4 (*)    All other components within normal limits  COMPREHENSIVE METABOLIC PANEL - Abnormal; Notable for the following components:   Sodium 131 (*)    Potassium 3.2 (*)    Chloride 90 (*)    Glucose, Bld 122 (*)    AST 52 (*)    All other components within normal limits  RESPIRATORY PANEL BY RT PCR (FLU A&B, COVID)  URINALYSIS, COMPLETE (UACMP) WITH MICROSCOPIC  CBG MONITORING, ED  TROPONIN I (HIGH SENSITIVITY)   ___________________________________________   DIFFERENTIAL DIAGNOSIS   Stem cell injection side effect, dehydration, electrolyte abnormality, chronic pain, occult infection  FINAL ASSESSMENT AND PLAN  Pain, weakness   Plan: The patient had presented for leg pain and weakness. Patient's labs did not reveal any acute process and she was given IV fluids here.  Patient currently needs physical therapy and a respite bed  at her facility.  We have arranged for same.  She is cleared for outpatient follow-up.   Laurence Aly, MD    Note: This note was generated in part or whole with voice recognition software. Voice recognition is usually quite accurate but there are transcription errors that can and very often do occur. I apologize for any typographical errors that were not detected and corrected.     Earleen Newport, MD 01/26/20 [phone]

## 2020-01-26 NOTE — NC FL2 (Signed)
[place] LEVEL OF CARE SCREENING TOOL     IDENTIFICATION  Patient Name: [place]: 1936-04-20 Sex: female Admission Date (Current Location): 01/26/2020  [place] and [place] Number:  Engineering geologist and Address:  [facility], [address] [place], [place] [address]      Provider Number: [id number]  Attending Physician Name and Address:  Earleen Newport, MD  Relative Name and Phone Number:  [place] [phone]    Current Level of Care: Hospital Recommended Level of Care: [place] Prior Approval Number:    Date Approved/Denied:   PASRR Number: [id number]  Discharge Plan: SNF    Current Diagnoses: Patient Active Problem List   Diagnosis Date Noted  . TIA (transient ischemic attack) 01/03/2020  . Migraine headache with aura 01/03/2020  . Essential hypertension 01/03/2020  . Depression 01/03/2020  . COPD with chronic bronchitis ([place]) 01/03/2020  . Hypokalemia 01/03/2020  . SOB (shortness of breath) 04/30/2014  . Leg swelling 04/30/2014  . Bronchiectasis without complication ([place]) [id number]  . History of DVT (deep vein thrombosis) 04/30/2014  . Labile blood pressure 04/30/2014  . S/P IVC filter 04/30/2014  . History of right knee surgery 04/30/2014    Orientation RESPIRATION BLADDER Height & Weight     Self, Time, Situation, Place  Normal Continent Weight: 117 lb (53.1 kg) Height:  5\' 2"  (157.5 cm)  BEHAVIORAL SYMPTOMS/MOOD NEUROLOGICAL BOWEL NUTRITION STATUS      Continent Diet  AMBULATORY STATUS COMMUNICATION OF NEEDS Skin   Independent Verbally Normal                       Personal Care Assistance Level of Assistance              Functional Limitations Info             SPECIAL CARE FACTORS FREQUENCY                       Contractures Contractures Info: Not present    Additional Factors Info                   Current Medications (01/26/2020):  This is the current hospital active medication list No current facility-administered medications for this encounter.   Current Outpatient Medications  Medication Sig Dispense Refill  . albuterol (PROVENTIL HFA;VENTOLIN HFA) 108 (90 Base) MCG/ACT inhaler Inhale into the lungs every 6 (six) hours as needed for wheezing or shortness of breath.    Marland Kitchen amLODipine (NORVASC) 2.5 MG tablet Take 1 tablet (2.5 mg total) by mouth daily. 180 tablet 3  . ARIPiprazole (ABILIFY) 2 MG tablet Take 2 mg by mouth daily.     Marland Kitchen aspirin EC 81 MG EC tablet Take 1 tablet (81 mg total) by mouth daily. 30 tablet 0  . atorvastatin (LIPITOR) 40 MG tablet Take 1 tablet (40 mg total) by mouth daily. 30 tablet 0  . budesonide (ENTOCORT EC) 3 MG 24 hr capsule Take 9 mg by mouth daily.     Marland Kitchen docusate sodium (COLACE) 100 MG capsule Take 1 capsule (100 mg total) by mouth 2 (two) times daily. 60 capsule 2  . doxazosin (CARDURA) 4 MG tablet TAKE 1/2 TABLET BY MOUTH TWICE DAILY (Patient taking differently: Take 2 mg by mouth daily. ) 90 tablet 3  . ezetimibe (ZETIA) 10 MG tablet Take 1 tablet (10 mg total) by mouth daily. [place]  tablet 3  . Fremanezumab-vfrm 225 MG/1.5ML SOAJ Inject 225 mg into the skin every 30 (thirty) days.    . hydrALAZINE (APRESOLINE) 25 MG tablet Take 1 tablet (25 mg total) by mouth as needed (for BP >170). 30 tablet 6  . hydrochlorothiazide (HYDRODIURIL) 25 MG tablet TAKE 1 TABLET BY MOUTH DAILY (Patient taking differently: Take 25 mg by mouth daily. ) 90 tablet 3  . HYDROmorphone (DILAUDID) 2 MG tablet Take 0.5 tablets (1 mg total) by mouth every 12 (twelve) hours as needed for up to 5 days for severe pain. 10 tablet 0  . hydrOXYzine (ATARAX/VISTARIL) 25 MG tablet Take 12.5 mg by mouth 3 (three) times daily as needed for anxiety.     Marland Kitchen losartan (COZAAR) 25 MG tablet Take 1 tablet (25 mg total) by mouth daily. 30 tablet 4  . mometasone-formoterol (DULERA) 100-5 MCG/ACT AERO  Inhale 2 puffs into the lungs in the morning and at bedtime.    . NON FORMULARY Take 2 capsules by mouth in the morning and at bedtime. Algaecal    . NON FORMULARY Take 2 capsules by mouth daily. Strontium Boost    . Omega-3 Fatty Acids (FISH OIL) 1000 MG CAPS Take 1,000 mg by mouth daily.    Marland Kitchen oxyCODONE-acetaminophen (PERCOCET) 5-325 MG tablet Take 1 tablet by mouth every 8 (eight) hours as needed. 20 tablet 0  . polyethylene glycol (MIRALAX / GLYCOLAX) packet Take 17 g by mouth daily as needed for mild constipation or moderate constipation.     . Red Yeast Rice Extract (RED YEAST RICE PO) Take 2 capsules by mouth daily.    . Rimegepant Sulfate (NURTEC) 75 MG TBDP Take 75 mg by mouth daily as needed (headache).     . SYMBICORT 160-4.5 MCG/ACT inhaler Inhale 2 puffs into the lungs in the morning and at bedtime.     Marland Kitchen venlafaxine XR (EFFEXOR-XR) 75 MG 24 hr capsule Take 75 mg by mouth daily with breakfast.       Discharge Medications: Please see discharge summary for a list of discharge medications.  Relevant Imaging Results:  Relevant Lab Results:   Additional Information SS# [id number]  Adelene Amas, LCSWA

## 2020-01-26 NOTE — Telephone Encounter (Signed)
Would recommend she monitor blood pressure rule out the amlodipine 2 hours after her morning medications Would she be able to call us back next week with some blood pressure measurements It will help determine if additional medication is needed

## 2020-01-26 NOTE — Telephone Encounter (Signed)
Called to give the patient Dr. Donivan Scull response and recommendation. [email or web address].

## 2020-01-26 NOTE — TOC Initial Note (Signed)
Transition of Care [facility]) - Initial/Assessment Note    Patient Details  Name: AMELEA KUKUK MRN: [id number] Date of Birth: Dec 17, 1935  Transition of [facility]) CM/SW Contact:    Ova Freshwater Phone Number: [phone] 01/26/2020, 11:55 AM  Clinical Narrative:                  Patient presents at [facility]/ED with weakness and bilateral hip pain.  Patient is currently in independent living at [facility]. Joelene Millin [phone] contacted this CSW from [place], to let me know the patient will be returning to [facility] once she is discharged from the ED.  Patient will need a rapid COVID test, this CSW informed EDP.     Report # [id number], Room 340       Patient Goals and CMS Choice        Expected Discharge Plan and Services                                                Prior Living Arrangements/Services                       Activities of Daily Living      Permission Sought/Granted                  Emotional Assessment              Admission diagnosis:  weakness Patient Active Problem List   Diagnosis Date Noted  . TIA (transient ischemic attack) 01/03/2020  . Migraine headache with aura 01/03/2020  . Essential hypertension 01/03/2020  . Depression 01/03/2020  . COPD with chronic bronchitis ([place]) 01/03/2020  . Hypokalemia 01/03/2020  . SOB (shortness of breath) 04/30/2014  . Leg swelling 04/30/2014  . Bronchiectasis without complication ([place]) [id number]  . History of DVT (deep vein thrombosis) 04/30/2014  . Labile blood pressure 04/30/2014  . S/P IVC filter 04/30/2014  . History of right knee surgery 04/30/2014   PCP:  Tracie Harrier, MD Pharmacy:   [facility], [place] [address] Phone: [phone] Fax: [phone]     Social Determinants of Health (SDOH) Interventions    Readmission Risk Interventions No flowsheet data  found.

## 2020-01-26 NOTE — ED Notes (Signed)
[facility]  CALLED  FOR  TRANSPORT 

## 2020-01-26 NOTE — NC FL2 (Signed)
[place] LEVEL OF CARE SCREENING TOOL     IDENTIFICATION  Patient Name: [place]: [birth year] Sex: female Admission Date (Current Location): 01/26/2020  [place] and [place] Number:  Engineering geologist and Address:  [facility], [address] [place], [place] [address]      Provider Number: [id number]  Attending Physician Name and Address:  Earleen Newport, MD  Relative Name and Phone Number:  [place] [phone]    Current Level of Care: Hospital Recommended Level of Care: [place] Prior Approval Number:    Date Approved/Denied:   PASRR Number: [id number]  Discharge Plan: SNF    Current Diagnoses: Patient Active Problem List   Diagnosis Date Noted  . TIA (transient ischemic attack) 01/03/2020  . Migraine headache with aura 01/03/2020  . Essential hypertension 01/03/2020  . Depression 01/03/2020  . COPD with chronic bronchitis ([place]) 01/03/2020  . Hypokalemia 01/03/2020  . SOB (shortness of breath) 04/30/2014  . Leg swelling 04/30/2014  . Bronchiectasis without complication ([place]) [id number]  . History of DVT (deep vein thrombosis) 04/30/2014  . Labile blood pressure 04/30/2014  . S/P IVC filter 04/30/2014  . History of right knee surgery 04/30/2014    Orientation RESPIRATION BLADDER Height & Weight     Self, Time, Situation, Place  Normal Continent Weight: 117 lb (53.1 kg) Height:  5\' 2"  (157.5 cm)  BEHAVIORAL SYMPTOMS/MOOD NEUROLOGICAL BOWEL NUTRITION STATUS      Continent Diet  AMBULATORY STATUS COMMUNICATION OF NEEDS Skin   Independent Verbally Normal                       Personal Care Assistance Level of Assistance              Functional Limitations Info             SPECIAL CARE FACTORS FREQUENCY                       Contractures Contractures Info: Not present    Additional Factors Info                   Current Medications (01/26/2020):  This is the current hospital active medication list No current facility-administered medications for this encounter.   Current Outpatient Medications  Medication Sig Dispense Refill  . albuterol (PROVENTIL HFA;VENTOLIN HFA) 108 (90 Base) MCG/ACT inhaler Inhale into the lungs every 6 (six) hours as needed for wheezing or shortness of breath.    Marland Kitchen amLODipine (NORVASC) 2.5 MG tablet Take 1 tablet (2.5 mg total) by mouth daily. 180 tablet 3  . ARIPiprazole (ABILIFY) 2 MG tablet Take 2 mg by mouth daily.     Marland Kitchen aspirin EC 81 MG EC tablet Take 1 tablet (81 mg total) by mouth daily. 30 tablet 0  . atorvastatin (LIPITOR) 40 MG tablet Take 1 tablet (40 mg total) by mouth daily. 30 tablet 0  . budesonide (ENTOCORT EC) 3 MG 24 hr capsule Take 9 mg by mouth daily.     Marland Kitchen docusate sodium (COLACE) 100 MG capsule Take 1 capsule (100 mg total) by mouth 2 (two) times daily. 60 capsule 2  . doxazosin (CARDURA) 4 MG tablet TAKE 1/2 TABLET BY MOUTH TWICE DAILY (Patient taking differently: Take 2 mg by mouth daily. ) 90 tablet 3  . ezetimibe (ZETIA) 10 MG tablet Take 1 tablet (10 mg total) by mouth daily. [place]  tablet 3  . Fremanezumab-vfrm 225 MG/1.5ML SOAJ Inject 225 mg into the skin every 30 (thirty) days.    . hydrALAZINE (APRESOLINE) 25 MG tablet Take 1 tablet (25 mg total) by mouth as needed (for BP >170). 30 tablet 6  . hydrochlorothiazide (HYDRODIURIL) 25 MG tablet TAKE 1 TABLET BY MOUTH DAILY (Patient taking differently: Take 25 mg by mouth daily. ) 90 tablet 3  . HYDROmorphone (DILAUDID) 2 MG tablet Take 0.5 tablets (1 mg total) by mouth every 12 (twelve) hours as needed for up to 5 days for severe pain. 10 tablet 0  . hydrOXYzine (ATARAX/VISTARIL) 25 MG tablet Take 12.5 mg by mouth 3 (three) times daily as needed for anxiety.     Marland Kitchen losartan (COZAAR) 25 MG tablet Take 1 tablet (25 mg total) by mouth daily. 30 tablet 4  . mometasone-formoterol (DULERA) 100-5 MCG/ACT AERO  Inhale 2 puffs into the lungs in the morning and at bedtime.    . NON FORMULARY Take 2 capsules by mouth in the morning and at bedtime. Algaecal    . NON FORMULARY Take 2 capsules by mouth daily. Strontium Boost    . Omega-3 Fatty Acids (FISH OIL) 1000 MG CAPS Take 1,000 mg by mouth daily.    Marland Kitchen oxyCODONE-acetaminophen (PERCOCET) 5-325 MG tablet Take 1 tablet by mouth every 8 (eight) hours as needed. 20 tablet 0  . polyethylene glycol (MIRALAX / GLYCOLAX) packet Take 17 g by mouth daily as needed for mild constipation or moderate constipation.     . Red Yeast Rice Extract (RED YEAST RICE PO) Take 2 capsules by mouth daily.    . Rimegepant Sulfate (NURTEC) 75 MG TBDP Take 75 mg by mouth daily as needed (headache).     . SYMBICORT 160-4.5 MCG/ACT inhaler Inhale 2 puffs into the lungs in the morning and at bedtime.     Marland Kitchen venlafaxine XR (EFFEXOR-XR) 75 MG 24 hr capsule Take 75 mg by mouth daily with breakfast.       Discharge Medications: Please see discharge summary for a list of discharge medications.  Relevant Imaging Results:  Relevant Lab Results:   Additional Information SS# [id number]  Adelene Amas, LCSWA

## 2020-01-26 NOTE — ED Triage Notes (Signed)
pt arrives via ems from home. ems reports stem cell injection 5 years ago and one yesterday into left knee. 75% loss of strength since last injection but pt doctor told her would happen. Pt main complaint is weakness in left leg. pain 8/10 in bilateral hips. pt a&o x 4 on arrival, md present to assess  Ems reports [facility] from [facility] and due to pt inability to stand [place] contacted ems for pt to be sent to er  Ems vitals bp 142/72 hr 70 98% room air t 98.0

## 2020-01-26 NOTE — TOC Transition Note (Addendum)
Transition of Care [facility]) - CM/SW Discharge Note   Patient Details  Name: Lindsey [name] MRN: [id number] Date of Birth: Nov 25, 1935  Transition of Care [facility]) CM/SW Contact:  Ova Freshwater Phone Number: [phone] 01/26/2020, 3:20 PM   Clinical Narrative:     Patient will return to [facility].  Report # [id number], Room # 340, include discharge paperwork and any new scrips.  EDP, ED/RN and ED Secretary notified via IM.     Barriers to Discharge: No Barriers Identified   Patient Goals and CMS Choice        Discharge Placement                       Discharge Plan and Services In-house Referral: Clinical Social Work                                   Social Determinants of Health (SDOH) Interventions     Readmission Risk Interventions No flowsheet data found.

## 2020-01-29 DIAGNOSIS — G43119 Migraine with aura, intractable, without status migrainosus: Secondary | ICD-10-CM | POA: Diagnosis not present

## 2020-01-29 DIAGNOSIS — J479 Bronchiectasis, uncomplicated: Secondary | ICD-10-CM | POA: Diagnosis not present

## 2020-01-29 DIAGNOSIS — M5030 Other cervical disc degeneration, unspecified cervical region: Secondary | ICD-10-CM | POA: Diagnosis not present

## 2020-01-29 DIAGNOSIS — F3340 Major depressive disorder, recurrent, in remission, unspecified: Secondary | ICD-10-CM | POA: Diagnosis not present

## 2020-01-29 DIAGNOSIS — M797 Fibromyalgia: Secondary | ICD-10-CM | POA: Diagnosis not present

## 2020-01-29 DIAGNOSIS — E876 Hypokalemia: Secondary | ICD-10-CM | POA: Diagnosis not present

## 2020-01-29 DIAGNOSIS — M25552 Pain in left hip: Secondary | ICD-10-CM | POA: Diagnosis not present

## 2020-01-29 DIAGNOSIS — M6281 Muscle weakness (generalized): Secondary | ICD-10-CM | POA: Diagnosis not present

## 2020-01-29 DIAGNOSIS — M25551 Pain in right hip: Secondary | ICD-10-CM | POA: Diagnosis not present

## 2020-01-29 DIAGNOSIS — R2689 Other abnormalities of gait and mobility: Secondary | ICD-10-CM | POA: Diagnosis not present

## 2020-01-29 DIAGNOSIS — M25562 Pain in left knee: Secondary | ICD-10-CM | POA: Diagnosis not present

## 2020-01-29 DIAGNOSIS — I10 Essential (primary) hypertension: Secondary | ICD-10-CM | POA: Diagnosis not present

## 2020-01-29 DIAGNOSIS — J309 Allergic rhinitis, unspecified: Secondary | ICD-10-CM | POA: Diagnosis not present

## 2020-01-29 DIAGNOSIS — R41841 Cognitive communication deficit: Secondary | ICD-10-CM | POA: Diagnosis not present

## 2020-01-29 DIAGNOSIS — K5903 Drug induced constipation: Secondary | ICD-10-CM | POA: Diagnosis not present

## 2020-01-29 DIAGNOSIS — J449 Chronic obstructive pulmonary disease, unspecified: Secondary | ICD-10-CM | POA: Diagnosis not present

## 2020-01-29 DIAGNOSIS — G459 Transient cerebral ischemic attack, unspecified: Secondary | ICD-10-CM | POA: Diagnosis not present

## 2020-01-29 DIAGNOSIS — M1990 Unspecified osteoarthritis, unspecified site: Secondary | ICD-10-CM | POA: Diagnosis not present

## 2020-01-30 NOTE — Telephone Encounter (Signed)
Spoke with patient and reviewed provider recommendations and she is currently at [facility] and said she may be discharged tomorrow. She has appointment next week with Dr. Rockey Situ and requested that she please continue monitoring blood pressures and bring that list to her appointment. Confirmed appointment, date, and time. She verbalized understanding with no further questions at this time.

## 2020-02-05 ENCOUNTER — Telehealth: Payer: Self-pay | Admitting: Cardiovascular Disease

## 2020-02-05 DIAGNOSIS — M25552 Pain in left hip: Secondary | ICD-10-CM | POA: Diagnosis not present

## 2020-02-05 DIAGNOSIS — R41841 Cognitive communication deficit: Secondary | ICD-10-CM | POA: Diagnosis not present

## 2020-02-05 DIAGNOSIS — G43119 Migraine with aura, intractable, without status migrainosus: Secondary | ICD-10-CM | POA: Diagnosis not present

## 2020-02-05 DIAGNOSIS — M1990 Unspecified osteoarthritis, unspecified site: Secondary | ICD-10-CM | POA: Diagnosis not present

## 2020-02-05 DIAGNOSIS — R2689 Other abnormalities of gait and mobility: Secondary | ICD-10-CM | POA: Diagnosis not present

## 2020-02-05 DIAGNOSIS — M6281 Muscle weakness (generalized): Secondary | ICD-10-CM | POA: Diagnosis not present

## 2020-02-05 DIAGNOSIS — G459 Transient cerebral ischemic attack, unspecified: Secondary | ICD-10-CM | POA: Diagnosis not present

## 2020-02-05 DIAGNOSIS — I10 Essential (primary) hypertension: Secondary | ICD-10-CM | POA: Diagnosis not present

## 2020-02-05 DIAGNOSIS — M25562 Pain in left knee: Secondary | ICD-10-CM | POA: Diagnosis not present

## 2020-02-05 DIAGNOSIS — R4701 Aphasia: Secondary | ICD-10-CM | POA: Diagnosis not present

## 2020-02-05 NOTE — Telephone Encounter (Signed)
  Patient Consent for Virtual Visit         Lindsey [name] has provided verbal consent on 02/05/2020 for a virtual visit (video or telephone).   CONSENT FOR VIRTUAL VISIT FOR:  [place]  By participating in this virtual visit I agree to the following:  I hereby voluntarily request, consent and authorize [facility] and its employed or contracted physicians, Engineer, materials, nurse practitioners or other licensed health care professionals (the Practitioner), to provide me with telemedicine health care services (the "Services") as deemed necessary by the treating Practitioner. I acknowledge and consent to receive the Services by the Practitioner via telemedicine. I understand that the telemedicine visit will involve communicating with the Practitioner through live audiovisual communication technology and the disclosure of certain medical information by electronic transmission. I acknowledge that I have been given the opportunity to request an in-person assessment or other available alternative prior to the telemedicine visit and am voluntarily participating in the telemedicine visit.  I understand that I have the right to withhold or withdraw my consent to the use of telemedicine in the course of my care at any time, without affecting my right to future care or treatment, and that the Practitioner or I may terminate the telemedicine visit at any time. I understand that I have the right to inspect all information obtained and/or recorded in the course of the telemedicine visit and may receive copies of available information for a reasonable fee.  I understand that some of the potential risks of receiving the Services via telemedicine include:  Marland Kitchen Delay or interruption in medical evaluation due to technological equipment failure or disruption; . Information transmitted may not be sufficient (e.g. poor resolution of images) to allow for appropriate medical decision making by the  Practitioner; and/or  . In rare instances, security protocols could fail, causing a breach of personal health information.  Furthermore, I acknowledge that it is my responsibility to provide information about my medical history, conditions and care that is complete and accurate to the best of my ability. I acknowledge that Practitioner's advice, recommendations, and/or decision may be based on factors not within their control, such as incomplete or inaccurate data provided by me or distortions of diagnostic images or specimens that may result from electronic transmissions. I understand that the practice of medicine is not an exact science and that Practitioner makes no warranties or guarantees regarding treatment outcomes. I acknowledge that a copy of this consent can be made available to me via my patient portal ([place]), or I can request a printed copy by calling the office of [facility].    I understand that my insurance will be billed for this visit.   I have read or had this consent read to me. . I understand the contents of this consent, which adequately explains the benefits and risks of the Services being provided via telemedicine.  . I have been provided ample opportunity to ask questions regarding this consent and the Services and have had my questions answered to my satisfaction. . I give my informed consent for the services to be provided through the use of telemedicine in my medical care

## 2020-02-06 ENCOUNTER — Ambulatory Visit: Payer: PPO | Admitting: Cardiovascular Disease

## 2020-02-06 DIAGNOSIS — H353211 Exudative age-related macular degeneration, right eye, with active choroidal neovascularization: Secondary | ICD-10-CM | POA: Diagnosis not present

## 2020-02-07 ENCOUNTER — Encounter: Payer: Self-pay | Admitting: Family

## 2020-02-07 ENCOUNTER — Telehealth (INDEPENDENT_AMBULATORY_CARE_PROVIDER_SITE_OTHER): Payer: PPO | Admitting: Family

## 2020-02-07 VITALS — BP 136/87 | HR 81 | Ht 62.0 in | Wt 116.0 lb

## 2020-02-07 DIAGNOSIS — I10 Essential (primary) hypertension: Secondary | ICD-10-CM

## 2020-02-07 DIAGNOSIS — R2689 Other abnormalities of gait and mobility: Secondary | ICD-10-CM | POA: Diagnosis not present

## 2020-02-07 DIAGNOSIS — E782 Mixed hyperlipidemia: Secondary | ICD-10-CM

## 2020-02-07 DIAGNOSIS — R279 Unspecified lack of coordination: Secondary | ICD-10-CM | POA: Diagnosis not present

## 2020-02-07 DIAGNOSIS — E876 Hypokalemia: Secondary | ICD-10-CM

## 2020-02-07 DIAGNOSIS — I872 Venous insufficiency (chronic) (peripheral): Secondary | ICD-10-CM

## 2020-02-07 DIAGNOSIS — M7989 Other specified soft tissue disorders: Secondary | ICD-10-CM | POA: Diagnosis not present

## 2020-02-07 DIAGNOSIS — Z79899 Other long term (current) drug therapy: Secondary | ICD-10-CM

## 2020-02-07 DIAGNOSIS — G43119 Migraine with aura, intractable, without status migrainosus: Secondary | ICD-10-CM | POA: Diagnosis not present

## 2020-02-07 DIAGNOSIS — M1990 Unspecified osteoarthritis, unspecified site: Secondary | ICD-10-CM | POA: Diagnosis not present

## 2020-02-07 DIAGNOSIS — M6281 Muscle weakness (generalized): Secondary | ICD-10-CM | POA: Diagnosis not present

## 2020-02-07 MED ORDER — POTASSIUM CHLORIDE CRYS ER 10 MEQ PO TBCR: 10.0000 meq | EXTENDED_RELEASE_TABLET | Freq: Every day | ORAL | 3 refills | Status: DC

## 2020-02-07 NOTE — Patient Instructions (Addendum)
Medication Instructions:   1- START Potassium Take 1 tablet (10 mEq total) by mouth daily  2- STOP Amlodipine   3- Atorvastatin, Red yeast rice, Amlodipine removed from your med list  *If you need a refill on your cardiac medications before your next appointment, please call your pharmacy*   Lab Work: 1- Labs in 1 week at [facility] ([facility])  *Laurann [place], NP has called the provider and awaits callback to hopefully schedule this.  We will update you as we are updated.*  Testing/Procedures: None ordered today   Follow-Up: At [facility], you and your health needs are our priority.  As part of our continuing mission to provide you with exceptional heart care, we have created designated Provider Care Teams.  These Care Teams include your primary Cardiologist (physician) and Advanced Practice Providers (APPs -  Physician Assistants and Nurse Practitioners) who all work together to provide you with the care you need, when you need it.  We recommend signing up for the patient portal called "MyChart".  Sign up information is provided on this After Visit Summary.  MyChart is used to connect with patients for Virtual Visits (Telemedicine).  Patients are able to view lab/test results, encounter notes, upcoming appointments, etc.  Non-urgent messages can be sent to your provider as well.   To learn more about what you can do with MyChart, go to [email or web address].    Your next appointment:  In August as previously scheduled  Other Instructions:  Blood pressure is very commonly elevated in times of pain or worry.  Continue to use your hydralazine as needed for systolic blood pressure (the top number) greater than 170  Recommend wearing compression stockings during flight for your upcoming trip, as discussed on the phone.  We will update you regarding lab follow-up when we hear from the Mr. Madie Reno at the health clinic at [facility].

## 2020-02-07 NOTE — Progress Notes (Signed)
Virtual Visit via Telephone Note   This visit type was conducted due to national recommendations for restrictions regarding the COVID-19 Pandemic (e.g. social distancing) in an effort to limit this patient's exposure and mitigate transmission in our community.  Due to her co-morbid illnesses, this patient is at least at moderate risk for complications without adequate follow up.  This format is felt to be most appropriate for this patient at this time.  The patient did not have access to video technology/had technical difficulties with video requiring transitioning to audio format only (telephone).  All issues noted in this document were discussed and addressed.  No physical exam could be performed with this format.  Please refer to the patient's chart for her  consent to telehealth for [facility].   The patient was identified using 2 identifiers.  Date:  02/07/2020   ID:  Lindsey [name], DOB [birth year], MRN [id number]  Patient Location: Home Provider Location: Office  PCP:  Tracie Harrier, MD  Cardiologist:  Ida Rogue, MD  Electrophysiologist:  None   Evaluation Performed:  Follow-Up Visit  Chief Complaint: labile blood pressures  History of Present Illness:    Lindsey [name] is a 84 y.o. female with labile blood pressure, bradycardia, venous insufficiency, , right knee surgery and RLE DVT s/p IVC filter, bronchiectasis, back surgery.  She is retired Electrical engineer.  She was last seen in clinic 01/16/2020 by J.Visser, [place].  Patient had blood pressure at home with a wrist cuff which was recently checked and consistent with her clinic pressures.  Echo 12/2019 LVEF 60-65%, GR 2 DD, RV normal size and function, mild MR, mild AI.  Carotid duplex 01/2020 with mild atherosclerotic disease without significant stenosis, estimated stenosis of ICA less than 50% bilaterally.  Noted history of increased swelling with increased doses of amlodipine.  At last clinic visit it was noted  that her ARB had been recommended for transition from losartan to telmisartan but changes not been made and had been subsequently not been taking.  Losartan 25 mg daily was started.  She prefers not to take statin.  She called the office 01/23/2019 noted that she had run out of amlodipine for 2 days and felt much better.  She was recommended to continue to monitor her blood pressure and not resume.  Seen in the ED 01/25/2020 with weakness after stem cell injection the day prior.  She was treated with IVF.  Noted by orthopedist who did the stem cell injection her knee in hopes that swelling is a common side effect for 1 week post.  Present antihypertensive regimen includes:  Amlodipine 2.5mg  daily, Cardura 2mg  BID, Hydralazine PRN SBP >170, HCTZ 25mg  daily, Losartan 25mg  daily,   Contacted via telephone for visit today.  She reports her right hip is not hurting for from the injection but her left side where she had hip and knee injection is still painful and affecting her mobility.  Did have blood pressure up to systolic [id number] one day since last seen which resolved after taking her dose of hydralazine.  We discussed that pain and/or worry could make her blood pressure be elevated and labile.  Otherwise blood pressure has been reasonably well controlled with systolic blood pressure overall 120s to 130s.  She is excited about traveling to [name] to see her son-in-law get married later this month.  She plans to wear compression stockings while traveling.  Reports her right ankle does still swell some but this has mostly resolved since discontinuing  her amlodipine.  We discussed her history of low potassium.  Tells me she has not been on a potassium supplement.  Potassium 01/26/2019 when checked in the ED was 3.2.  We discussed starting potassium tablet and follow-up labs.  Tells me that where she lives at the [facility] there is a health clinic where she has had her blood work drawn before and she  provided me with the name of the provider, Madie Reno, as well as his phone number so that we can hopefully coordinate follow-up labs.  Reports no shortness of breath nor dyspnea on exertion. Reports no chest pain, pressure, or tightness. No orthopnea, PND. Reports no palpitations.   The patient does not have symptoms concerning for COVID-19 infection (fever, chills, cough, or new shortness of breath).    Past Medical History:  Diagnosis Date  . Anemia   . Asthma   . Breast cancer ([place])   . Bronchiectasis ([place]) 06/2007  . Cancer ([place])   . Colitis   . COPD (chronic obstructive pulmonary disease) ([place])   . Depression   . DVT (deep venous thrombosis) ([place])    after her right knee surgery.   . Fibromyalgia   . Fibromyalgia   . Hypertension   . Migraine headache with aura   . Osteoarthritis   . Osteoporosis   . Pneumonia   . Polio 1952   Past Surgical History:  Procedure Laterality Date  . ABDOMINAL HYSTERECTOMY    . BACK SURGERY    . BILATERAL TOTAL MASTECTOMY WITH AXILLARY LYMPH NODE DISSECTION    . CARPAL TUNNEL RELEASE     bilateral   . COLONOSCOPY WITH PROPOFOL N/A 08/02/2015   Procedure: COLONOSCOPY WITH PROPOFOL;  Surgeon: Manya Silvas, MD;  Location: [facility] ENDOSCOPY;  Service: Endoscopy;  Laterality: N/A;  . FINGER TENDON REPAIR    . HERNIA REPAIR    . REPLACEMENT TOTAL KNEE     right knee   . SQUAMOUS CELL CARCINOMA EXCISION    . TONSILLECTOMY    . UMBILICAL HERNIA REPAIR    . VAGINAL HYSTERECTOMY       Current Meds  Medication Sig  . albuterol (PROVENTIL HFA;VENTOLIN HFA) 108 (90 Base) MCG/ACT inhaler Inhale into the lungs every 6 (six) hours as needed for wheezing or shortness of breath.  . ARIPiprazole (ABILIFY) 2 MG tablet Take 2 mg by mouth daily.   Marland Kitchen aspirin EC 81 MG EC tablet Take 1 tablet (81 mg total) by mouth daily.  . budesonide (ENTOCORT EC) 3 MG 24 hr capsule Take 9 mg by mouth daily.   Marland Kitchen doxazosin (CARDURA) 2 MG tablet Take 2 mg by mouth 2  (two) times daily.   Marland Kitchen ezetimibe (ZETIA) 10 MG tablet Take 1 tablet (10 mg total) by mouth daily.  . Fremanezumab-vfrm 225 MG/1.5ML SOAJ Inject 225 mg into the skin every 30 (thirty) days.  . hydrALAZINE (APRESOLINE) 25 MG tablet Take 1 tablet (25 mg total) by mouth as needed (for BP >170).  . hydrochlorothiazide (HYDRODIURIL) 25 MG tablet TAKE 1 TABLET BY MOUTH DAILY (Patient taking differently: Take 25 mg by mouth daily. )  . hydrOXYzine (ATARAX/VISTARIL) 25 MG tablet Take 25 mg by mouth daily.   Marland Kitchen losartan (COZAAR) 25 MG tablet Take 1 tablet (25 mg total) by mouth daily.  . mometasone-formoterol (DULERA) 100-5 MCG/ACT AERO Inhale 2 puffs into the lungs in the morning and at bedtime.  . NON FORMULARY Take 2 capsules by mouth in the morning and  at bedtime. Algaecal  . NON FORMULARY Take 2 capsules by mouth daily. Strontium Boost  . polyethylene glycol (MIRALAX / GLYCOLAX) packet Take 17 g by mouth daily as needed for mild constipation or moderate constipation.   . rizatriptan (MAXALT) 5 MG tablet Take 5 mg by mouth as needed.   . venlafaxine XR (EFFEXOR-XR) 75 MG 24 hr capsule Take 75 mg by mouth daily with breakfast.     Allergies:   Ivp dye [iodinated diagnostic agents], Codeine, Sulfa antibiotics, Tegaderm ag mesh [silver], and Tetracyclines & related   Social History   Tobacco Use  . Smoking status: Never Smoker  . Smokeless tobacco: Never Used  Substance Use Topics  . Alcohol use: Not Currently    Alcohol/week: 1.0 standard drinks    Types: 1 Glasses of wine per week  . Drug use: No    Family Hx: The patient's Family history is unknown by patient.  ROS:   Please see the history of present illness.    Review of Systems  Constitution: Negative for chills, fever and malaise/fatigue.  Cardiovascular: Positive for leg swelling (mild right ankle). Negative for chest pain, dyspnea on exertion, near-syncope, orthopnea, palpitations and syncope.  Respiratory: Negative for cough,  shortness of breath and wheezing.   Musculoskeletal:       (+) left leg and hip pain  Gastrointestinal: Negative for nausea and vomiting.  Neurological: Negative for dizziness, light-headedness and weakness.    All other systems reviewed and are negative.  Prior CV studies:   The following studies were reviewed today:   Echo 01/03/20 1. Left ventricular ejection fraction, by estimation, is 60 to 65%. The  left ventricle has normal function. The left ventricle has no regional  wall motion abnormalities. Left ventricular diastolic parameters are  consistent with Grade II diastolic  dysfunction (pseudonormalization).   2. Right ventricular systolic function is normal. The right ventricular  size is normal.   3. The mitral valve is normal in structure. Mild mitral valve  regurgitation.   4. The aortic valve is normal in structure. Aortic valve regurgitation is  mild.   Carotid ultrasound 01/03/20 IMPRESSION: Mild atherosclerotic disease in the carotid arteries without significant stenosis. Estimated degree of stenosis in the internal carotid arteries is less than 50% bilaterally.  Labs/Other Tests and Data Reviewed:    EKG:  No ECG reviewed.  Recent Labs: 01/26/2020: ALT 28; BUN 16; Creatinine, Ser 0.59; Hemoglobin 13.9; Platelets 194; Potassium 3.2; Sodium 131   Recent Lipid Panel Lab Results  Component Value Date/Time   CHOL 204 (H) 01/04/2020 04:55 AM   TRIG 37 01/04/2020 04:55 AM   HDL 113 01/04/2020 04:55 AM   CHOLHDL 1.8 01/04/2020 04:55 AM   LDLCALC 84 01/04/2020 04:55 AM    Wt Readings from Last 3 Encounters:  02/07/20 116 lb (52.6 kg)  01/26/20 117 lb (53.1 kg)  01/16/20 119 lb 6 oz (54.1 kg)     Objective:    Vital Signs:  BP 136/87   Pulse 81   Ht 5\' 2"  (1.575 m)   Wt 116 lb (52.6 kg)   BMI 21.22 kg/m    VITAL SIGNS:  reviewed  ASSESSMENT & PLAN:    1. HTN -blood pressure overall well controlled with systolic routinely [id number] to 130s.  Isolated  episode SBP 177 treated with her as needed hydralazine.  She recently had stem cell injections to right hip, left hip, left knee and reports some significant postprocedure discomfort.  We discussed the worry  and pain can both contribute to elevated or labile blood pressures.  She has stopped her amlodipine and reported improvement in lower extremity edema-discontinue amlodipine.  Continue present antihypertensive regimen including Cardura 2 mg twice daily, hydralazine as needed for SBP >170, losartan 25 mg daily, HCTZ 25 mg daily. BMET for monitoring of renal function, as below.   2. Hypokalemia -on review longstanding history back to 2016.  Not presently on potassium supplement.  Potassium 01/26/2020 of 3.2.  Start potassium 10 mEq daily.  Plan for repeat BMP in 1 week -I have called the provider at her health clinic at [facility] to inquire if labs could be done there to prevent her from having to travel to [facility].  3. HLD - Prefers to remain off atorvastatin-as she is not taking this was discontinued from her medication list.  Tolerating Zetia 10 mg daily without difficulty.  Lipid profile 01/04/2020 total cholesterol 204, HDL 113, LDL 84, triglycerides 37.  In the future could consider addition of Neclizet.  4. LE edema/venous insufficiency-reports this is improving since stopping amlodipine.  Continue low-sodium diet, elevating lower extremities, compression stockings.  No indication for diuretic this time.  Echo 12/2019 with normal LVEF.  Time:   Today, I have spent 16 minutes with the patient with telehealth technology discussing the above problems.     Medication Adjustments/Labs and Tests Ordered: Current medicines are reviewed at length with the patient today.  Concerns regarding medicines are outlined above.   Tests Ordered: No orders of the defined types were placed in this encounter.   Medication Changes: Meds ordered this encounter  Medications  . potassium chloride  (KLOR-CON M10) 10 MEQ tablet    Sig: Take 1 tablet (10 mEq total) by mouth daily.    Dispense:  90 tablet    Refill:  3    Follow Up:  In Person in August with Dr. Rockey Situ as previously scheduled  Signed, Loel Dubonnet, NP  02/07/2020 10:05 AM    [place]

## 2020-02-07 NOTE — Addendum Note (Signed)
Addended by: Loel Dubonnet on: 02/07/2020 10:09 AM   Modules accepted: Orders

## 2020-02-09 DIAGNOSIS — R279 Unspecified lack of coordination: Secondary | ICD-10-CM | POA: Diagnosis not present

## 2020-02-09 DIAGNOSIS — R2689 Other abnormalities of gait and mobility: Secondary | ICD-10-CM | POA: Diagnosis not present

## 2020-02-09 DIAGNOSIS — G43119 Migraine with aura, intractable, without status migrainosus: Secondary | ICD-10-CM | POA: Diagnosis not present

## 2020-02-09 DIAGNOSIS — M25562 Pain in left knee: Secondary | ICD-10-CM | POA: Diagnosis not present

## 2020-02-09 DIAGNOSIS — M25552 Pain in left hip: Secondary | ICD-10-CM | POA: Diagnosis not present

## 2020-02-09 DIAGNOSIS — M6281 Muscle weakness (generalized): Secondary | ICD-10-CM | POA: Diagnosis not present

## 2020-02-09 DIAGNOSIS — M1990 Unspecified osteoarthritis, unspecified site: Secondary | ICD-10-CM | POA: Diagnosis not present

## 2020-02-12 DIAGNOSIS — M1990 Unspecified osteoarthritis, unspecified site: Secondary | ICD-10-CM | POA: Diagnosis not present

## 2020-02-12 DIAGNOSIS — M25562 Pain in left knee: Secondary | ICD-10-CM | POA: Diagnosis not present

## 2020-02-12 DIAGNOSIS — R2689 Other abnormalities of gait and mobility: Secondary | ICD-10-CM | POA: Diagnosis not present

## 2020-02-12 DIAGNOSIS — M6281 Muscle weakness (generalized): Secondary | ICD-10-CM | POA: Diagnosis not present

## 2020-02-12 DIAGNOSIS — M25552 Pain in left hip: Secondary | ICD-10-CM | POA: Diagnosis not present

## 2020-02-12 DIAGNOSIS — R279 Unspecified lack of coordination: Secondary | ICD-10-CM | POA: Diagnosis not present

## 2020-02-12 DIAGNOSIS — G43119 Migraine with aura, intractable, without status migrainosus: Secondary | ICD-10-CM | POA: Diagnosis not present

## 2020-02-14 ENCOUNTER — Encounter: Payer: Self-pay | Admitting: Cardiovascular Disease

## 2020-02-14 DIAGNOSIS — R279 Unspecified lack of coordination: Secondary | ICD-10-CM | POA: Diagnosis not present

## 2020-02-14 DIAGNOSIS — R2689 Other abnormalities of gait and mobility: Secondary | ICD-10-CM | POA: Diagnosis not present

## 2020-02-14 DIAGNOSIS — G459 Transient cerebral ischemic attack, unspecified: Secondary | ICD-10-CM | POA: Diagnosis not present

## 2020-02-14 DIAGNOSIS — R4701 Aphasia: Secondary | ICD-10-CM | POA: Diagnosis not present

## 2020-02-14 DIAGNOSIS — R41841 Cognitive communication deficit: Secondary | ICD-10-CM | POA: Diagnosis not present

## 2020-02-14 DIAGNOSIS — M1990 Unspecified osteoarthritis, unspecified site: Secondary | ICD-10-CM | POA: Diagnosis not present

## 2020-02-14 DIAGNOSIS — G43119 Migraine with aura, intractable, without status migrainosus: Secondary | ICD-10-CM | POA: Diagnosis not present

## 2020-02-14 DIAGNOSIS — M25562 Pain in left knee: Secondary | ICD-10-CM | POA: Diagnosis not present

## 2020-02-14 DIAGNOSIS — M6281 Muscle weakness (generalized): Secondary | ICD-10-CM | POA: Diagnosis not present

## 2020-02-14 DIAGNOSIS — I10 Essential (primary) hypertension: Secondary | ICD-10-CM | POA: Diagnosis not present

## 2020-02-14 DIAGNOSIS — M25552 Pain in left hip: Secondary | ICD-10-CM | POA: Diagnosis not present

## 2020-02-15 DIAGNOSIS — H903 Sensorineural hearing loss, bilateral: Secondary | ICD-10-CM | POA: Diagnosis not present

## 2020-02-15 DIAGNOSIS — H6123 Impacted cerumen, bilateral: Secondary | ICD-10-CM | POA: Diagnosis not present

## 2020-02-26 DIAGNOSIS — M25562 Pain in left knee: Secondary | ICD-10-CM | POA: Diagnosis not present

## 2020-02-26 DIAGNOSIS — M1990 Unspecified osteoarthritis, unspecified site: Secondary | ICD-10-CM | POA: Diagnosis not present

## 2020-02-26 DIAGNOSIS — R2689 Other abnormalities of gait and mobility: Secondary | ICD-10-CM | POA: Diagnosis not present

## 2020-02-26 DIAGNOSIS — M6281 Muscle weakness (generalized): Secondary | ICD-10-CM | POA: Diagnosis not present

## 2020-02-26 DIAGNOSIS — M25552 Pain in left hip: Secondary | ICD-10-CM | POA: Diagnosis not present

## 2020-02-26 DIAGNOSIS — G43119 Migraine with aura, intractable, without status migrainosus: Secondary | ICD-10-CM | POA: Diagnosis not present

## 2020-02-26 DIAGNOSIS — R279 Unspecified lack of coordination: Secondary | ICD-10-CM | POA: Diagnosis not present

## 2020-02-28 DIAGNOSIS — M1990 Unspecified osteoarthritis, unspecified site: Secondary | ICD-10-CM | POA: Diagnosis not present

## 2020-02-28 DIAGNOSIS — R2689 Other abnormalities of gait and mobility: Secondary | ICD-10-CM | POA: Diagnosis not present

## 2020-02-28 DIAGNOSIS — M6281 Muscle weakness (generalized): Secondary | ICD-10-CM | POA: Diagnosis not present

## 2020-02-28 DIAGNOSIS — G43119 Migraine with aura, intractable, without status migrainosus: Secondary | ICD-10-CM | POA: Diagnosis not present

## 2020-02-28 DIAGNOSIS — M25552 Pain in left hip: Secondary | ICD-10-CM | POA: Diagnosis not present

## 2020-02-28 DIAGNOSIS — R279 Unspecified lack of coordination: Secondary | ICD-10-CM | POA: Diagnosis not present

## 2020-02-28 DIAGNOSIS — M25562 Pain in left knee: Secondary | ICD-10-CM | POA: Diagnosis not present

## 2020-03-06 DIAGNOSIS — G43119 Migraine with aura, intractable, without status migrainosus: Secondary | ICD-10-CM | POA: Diagnosis not present

## 2020-03-06 DIAGNOSIS — M25562 Pain in left knee: Secondary | ICD-10-CM | POA: Diagnosis not present

## 2020-03-06 DIAGNOSIS — R4701 Aphasia: Secondary | ICD-10-CM | POA: Diagnosis not present

## 2020-03-06 DIAGNOSIS — M1990 Unspecified osteoarthritis, unspecified site: Secondary | ICD-10-CM | POA: Diagnosis not present

## 2020-03-06 DIAGNOSIS — M25552 Pain in left hip: Secondary | ICD-10-CM | POA: Diagnosis not present

## 2020-03-06 DIAGNOSIS — G459 Transient cerebral ischemic attack, unspecified: Secondary | ICD-10-CM | POA: Diagnosis not present

## 2020-03-06 DIAGNOSIS — R2689 Other abnormalities of gait and mobility: Secondary | ICD-10-CM | POA: Diagnosis not present

## 2020-03-06 DIAGNOSIS — R279 Unspecified lack of coordination: Secondary | ICD-10-CM | POA: Diagnosis not present

## 2020-03-06 DIAGNOSIS — R41841 Cognitive communication deficit: Secondary | ICD-10-CM | POA: Diagnosis not present

## 2020-03-06 DIAGNOSIS — I10 Essential (primary) hypertension: Secondary | ICD-10-CM | POA: Diagnosis not present

## 2020-03-06 DIAGNOSIS — M6281 Muscle weakness (generalized): Secondary | ICD-10-CM | POA: Diagnosis not present

## 2020-03-11 DIAGNOSIS — R4701 Aphasia: Secondary | ICD-10-CM | POA: Diagnosis not present

## 2020-03-11 DIAGNOSIS — M25562 Pain in left knee: Secondary | ICD-10-CM | POA: Diagnosis not present

## 2020-03-11 DIAGNOSIS — G459 Transient cerebral ischemic attack, unspecified: Secondary | ICD-10-CM | POA: Diagnosis not present

## 2020-03-11 DIAGNOSIS — R279 Unspecified lack of coordination: Secondary | ICD-10-CM | POA: Diagnosis not present

## 2020-03-11 DIAGNOSIS — G43119 Migraine with aura, intractable, without status migrainosus: Secondary | ICD-10-CM | POA: Diagnosis not present

## 2020-03-11 DIAGNOSIS — I10 Essential (primary) hypertension: Secondary | ICD-10-CM | POA: Diagnosis not present

## 2020-03-11 DIAGNOSIS — M25552 Pain in left hip: Secondary | ICD-10-CM | POA: Diagnosis not present

## 2020-03-11 DIAGNOSIS — R2689 Other abnormalities of gait and mobility: Secondary | ICD-10-CM | POA: Diagnosis not present

## 2020-03-11 DIAGNOSIS — M1990 Unspecified osteoarthritis, unspecified site: Secondary | ICD-10-CM | POA: Diagnosis not present

## 2020-03-11 DIAGNOSIS — M6281 Muscle weakness (generalized): Secondary | ICD-10-CM | POA: Diagnosis not present

## 2020-03-11 DIAGNOSIS — R41841 Cognitive communication deficit: Secondary | ICD-10-CM | POA: Diagnosis not present

## 2020-03-15 DIAGNOSIS — M25552 Pain in left hip: Secondary | ICD-10-CM | POA: Diagnosis not present

## 2020-03-15 DIAGNOSIS — M25532 Pain in left wrist: Secondary | ICD-10-CM | POA: Diagnosis not present

## 2020-03-15 DIAGNOSIS — I10 Essential (primary) hypertension: Secondary | ICD-10-CM | POA: Diagnosis not present

## 2020-03-15 DIAGNOSIS — K52832 Lymphocytic colitis: Secondary | ICD-10-CM | POA: Diagnosis not present

## 2020-03-15 DIAGNOSIS — J449 Chronic obstructive pulmonary disease, unspecified: Secondary | ICD-10-CM | POA: Diagnosis not present

## 2020-03-15 DIAGNOSIS — R197 Diarrhea, unspecified: Secondary | ICD-10-CM | POA: Diagnosis not present

## 2020-03-15 DIAGNOSIS — M797 Fibromyalgia: Secondary | ICD-10-CM | POA: Diagnosis not present

## 2020-03-15 DIAGNOSIS — G8929 Other chronic pain: Secondary | ICD-10-CM | POA: Diagnosis not present

## 2020-03-15 DIAGNOSIS — I6523 Occlusion and stenosis of bilateral carotid arteries: Secondary | ICD-10-CM | POA: Diagnosis not present

## 2020-03-16 DIAGNOSIS — R279 Unspecified lack of coordination: Secondary | ICD-10-CM | POA: Diagnosis not present

## 2020-03-16 DIAGNOSIS — M1990 Unspecified osteoarthritis, unspecified site: Secondary | ICD-10-CM | POA: Diagnosis not present

## 2020-03-16 DIAGNOSIS — M6281 Muscle weakness (generalized): Secondary | ICD-10-CM | POA: Diagnosis not present

## 2020-03-16 DIAGNOSIS — G43119 Migraine with aura, intractable, without status migrainosus: Secondary | ICD-10-CM | POA: Diagnosis not present

## 2020-03-16 DIAGNOSIS — R2689 Other abnormalities of gait and mobility: Secondary | ICD-10-CM | POA: Diagnosis not present

## 2020-03-18 DIAGNOSIS — M810 Age-related osteoporosis without current pathological fracture: Secondary | ICD-10-CM | POA: Diagnosis not present

## 2020-03-19 DIAGNOSIS — M25552 Pain in left hip: Secondary | ICD-10-CM | POA: Diagnosis not present

## 2020-03-19 DIAGNOSIS — M6281 Muscle weakness (generalized): Secondary | ICD-10-CM | POA: Diagnosis not present

## 2020-03-19 DIAGNOSIS — M1990 Unspecified osteoarthritis, unspecified site: Secondary | ICD-10-CM | POA: Diagnosis not present

## 2020-03-19 DIAGNOSIS — R2689 Other abnormalities of gait and mobility: Secondary | ICD-10-CM | POA: Diagnosis not present

## 2020-03-19 DIAGNOSIS — M25562 Pain in left knee: Secondary | ICD-10-CM | POA: Diagnosis not present

## 2020-03-20 DIAGNOSIS — I10 Essential (primary) hypertension: Secondary | ICD-10-CM | POA: Diagnosis not present

## 2020-03-20 DIAGNOSIS — G8929 Other chronic pain: Secondary | ICD-10-CM | POA: Diagnosis not present

## 2020-03-20 DIAGNOSIS — M25552 Pain in left hip: Secondary | ICD-10-CM | POA: Diagnosis not present

## 2020-03-20 DIAGNOSIS — R4701 Aphasia: Secondary | ICD-10-CM | POA: Diagnosis not present

## 2020-03-20 DIAGNOSIS — R2689 Other abnormalities of gait and mobility: Secondary | ICD-10-CM | POA: Diagnosis not present

## 2020-03-20 DIAGNOSIS — M6281 Muscle weakness (generalized): Secondary | ICD-10-CM | POA: Diagnosis not present

## 2020-03-20 DIAGNOSIS — K52832 Lymphocytic colitis: Secondary | ICD-10-CM | POA: Diagnosis not present

## 2020-03-20 DIAGNOSIS — M25462 Effusion, left knee: Secondary | ICD-10-CM | POA: Diagnosis not present

## 2020-03-20 DIAGNOSIS — F3340 Major depressive disorder, recurrent, in remission, unspecified: Secondary | ICD-10-CM | POA: Diagnosis not present

## 2020-03-20 DIAGNOSIS — M1712 Unilateral primary osteoarthritis, left knee: Secondary | ICD-10-CM | POA: Diagnosis not present

## 2020-03-20 DIAGNOSIS — G43119 Migraine with aura, intractable, without status migrainosus: Secondary | ICD-10-CM | POA: Diagnosis not present

## 2020-03-20 DIAGNOSIS — R41841 Cognitive communication deficit: Secondary | ICD-10-CM | POA: Diagnosis not present

## 2020-03-20 DIAGNOSIS — M1612 Unilateral primary osteoarthritis, left hip: Secondary | ICD-10-CM | POA: Diagnosis not present

## 2020-03-20 DIAGNOSIS — Z0000 Encounter for general adult medical examination without abnormal findings: Secondary | ICD-10-CM | POA: Diagnosis not present

## 2020-03-20 DIAGNOSIS — M1990 Unspecified osteoarthritis, unspecified site: Secondary | ICD-10-CM | POA: Diagnosis not present

## 2020-03-20 DIAGNOSIS — R279 Unspecified lack of coordination: Secondary | ICD-10-CM | POA: Diagnosis not present

## 2020-03-20 DIAGNOSIS — G459 Transient cerebral ischemic attack, unspecified: Secondary | ICD-10-CM | POA: Diagnosis not present

## 2020-03-20 DIAGNOSIS — M7062 Trochanteric bursitis, left hip: Secondary | ICD-10-CM | POA: Diagnosis not present

## 2020-03-20 DIAGNOSIS — M25562 Pain in left knee: Secondary | ICD-10-CM | POA: Diagnosis not present

## 2020-03-20 DIAGNOSIS — M797 Fibromyalgia: Secondary | ICD-10-CM | POA: Diagnosis not present

## 2020-03-21 DIAGNOSIS — M797 Fibromyalgia: Secondary | ICD-10-CM | POA: Diagnosis not present

## 2020-03-21 DIAGNOSIS — M25552 Pain in left hip: Secondary | ICD-10-CM | POA: Diagnosis not present

## 2020-03-21 DIAGNOSIS — M25562 Pain in left knee: Secondary | ICD-10-CM | POA: Diagnosis not present

## 2020-03-21 DIAGNOSIS — G8929 Other chronic pain: Secondary | ICD-10-CM | POA: Diagnosis not present

## 2020-03-21 DIAGNOSIS — F3340 Major depressive disorder, recurrent, in remission, unspecified: Secondary | ICD-10-CM | POA: Diagnosis not present

## 2020-03-21 DIAGNOSIS — I10 Essential (primary) hypertension: Secondary | ICD-10-CM | POA: Diagnosis not present

## 2020-03-25 DIAGNOSIS — M7061 Trochanteric bursitis, right hip: Secondary | ICD-10-CM | POA: Diagnosis not present

## 2020-03-25 DIAGNOSIS — M7062 Trochanteric bursitis, left hip: Secondary | ICD-10-CM | POA: Diagnosis not present

## 2020-03-25 DIAGNOSIS — M1612 Unilateral primary osteoarthritis, left hip: Secondary | ICD-10-CM | POA: Diagnosis not present

## 2020-03-25 DIAGNOSIS — M1712 Unilateral primary osteoarthritis, left knee: Secondary | ICD-10-CM | POA: Diagnosis not present

## 2020-03-26 DIAGNOSIS — M25551 Pain in right hip: Secondary | ICD-10-CM | POA: Diagnosis not present

## 2020-03-26 DIAGNOSIS — M25552 Pain in left hip: Secondary | ICD-10-CM | POA: Diagnosis not present

## 2020-03-26 DIAGNOSIS — M7062 Trochanteric bursitis, left hip: Secondary | ICD-10-CM | POA: Diagnosis not present

## 2020-03-26 DIAGNOSIS — M7061 Trochanteric bursitis, right hip: Secondary | ICD-10-CM | POA: Diagnosis not present

## 2020-03-27 DIAGNOSIS — M6281 Muscle weakness (generalized): Secondary | ICD-10-CM | POA: Diagnosis not present

## 2020-03-27 DIAGNOSIS — R2689 Other abnormalities of gait and mobility: Secondary | ICD-10-CM | POA: Diagnosis not present

## 2020-03-27 DIAGNOSIS — M1990 Unspecified osteoarthritis, unspecified site: Secondary | ICD-10-CM | POA: Diagnosis not present

## 2020-03-27 DIAGNOSIS — R279 Unspecified lack of coordination: Secondary | ICD-10-CM | POA: Diagnosis not present

## 2020-03-27 DIAGNOSIS — G43119 Migraine with aura, intractable, without status migrainosus: Secondary | ICD-10-CM | POA: Diagnosis not present

## 2020-03-28 DIAGNOSIS — D2262 Melanocytic nevi of left upper limb, including shoulder: Secondary | ICD-10-CM | POA: Diagnosis not present

## 2020-03-28 DIAGNOSIS — L821 Other seborrheic keratosis: Secondary | ICD-10-CM | POA: Diagnosis not present

## 2020-03-28 DIAGNOSIS — D692 Other nonthrombocytopenic purpura: Secondary | ICD-10-CM | POA: Diagnosis not present

## 2020-03-28 DIAGNOSIS — D2261 Melanocytic nevi of right upper limb, including shoulder: Secondary | ICD-10-CM | POA: Diagnosis not present

## 2020-03-28 DIAGNOSIS — Z85828 Personal history of other malignant neoplasm of skin: Secondary | ICD-10-CM | POA: Diagnosis not present

## 2020-03-28 DIAGNOSIS — D2271 Melanocytic nevi of right lower limb, including hip: Secondary | ICD-10-CM | POA: Diagnosis not present

## 2020-03-28 DIAGNOSIS — X32XXXA Exposure to sunlight, initial encounter: Secondary | ICD-10-CM | POA: Diagnosis not present

## 2020-03-28 DIAGNOSIS — L570 Actinic keratosis: Secondary | ICD-10-CM | POA: Diagnosis not present

## 2020-03-29 DIAGNOSIS — M1990 Unspecified osteoarthritis, unspecified site: Secondary | ICD-10-CM | POA: Diagnosis not present

## 2020-03-29 DIAGNOSIS — R279 Unspecified lack of coordination: Secondary | ICD-10-CM | POA: Diagnosis not present

## 2020-03-29 DIAGNOSIS — R2689 Other abnormalities of gait and mobility: Secondary | ICD-10-CM | POA: Diagnosis not present

## 2020-03-29 DIAGNOSIS — M6281 Muscle weakness (generalized): Secondary | ICD-10-CM | POA: Diagnosis not present

## 2020-03-29 DIAGNOSIS — G43119 Migraine with aura, intractable, without status migrainosus: Secondary | ICD-10-CM | POA: Diagnosis not present

## 2020-04-01 DIAGNOSIS — I10 Essential (primary) hypertension: Secondary | ICD-10-CM | POA: Diagnosis not present

## 2020-04-01 DIAGNOSIS — G43119 Migraine with aura, intractable, without status migrainosus: Secondary | ICD-10-CM | POA: Diagnosis not present

## 2020-04-01 DIAGNOSIS — R41841 Cognitive communication deficit: Secondary | ICD-10-CM | POA: Diagnosis not present

## 2020-04-01 DIAGNOSIS — G459 Transient cerebral ischemic attack, unspecified: Secondary | ICD-10-CM | POA: Diagnosis not present

## 2020-04-01 DIAGNOSIS — R4701 Aphasia: Secondary | ICD-10-CM | POA: Diagnosis not present

## 2020-04-02 DIAGNOSIS — H353211 Exudative age-related macular degeneration, right eye, with active choroidal neovascularization: Secondary | ICD-10-CM | POA: Diagnosis not present

## 2020-04-03 DIAGNOSIS — M1712 Unilateral primary osteoarthritis, left knee: Secondary | ICD-10-CM | POA: Diagnosis not present

## 2020-04-03 DIAGNOSIS — G8929 Other chronic pain: Secondary | ICD-10-CM | POA: Diagnosis not present

## 2020-04-03 DIAGNOSIS — M25462 Effusion, left knee: Secondary | ICD-10-CM | POA: Diagnosis not present

## 2020-04-03 DIAGNOSIS — M7122 Synovial cyst of popliteal space [Baker], left knee: Secondary | ICD-10-CM | POA: Diagnosis not present

## 2020-04-03 DIAGNOSIS — M25562 Pain in left knee: Secondary | ICD-10-CM | POA: Diagnosis not present

## 2020-04-15 DIAGNOSIS — J449 Chronic obstructive pulmonary disease, unspecified: Secondary | ICD-10-CM | POA: Diagnosis not present

## 2020-04-15 DIAGNOSIS — J479 Bronchiectasis, uncomplicated: Secondary | ICD-10-CM | POA: Diagnosis not present

## 2020-04-15 DIAGNOSIS — R0982 Postnasal drip: Secondary | ICD-10-CM | POA: Diagnosis not present

## 2020-04-23 DIAGNOSIS — J449 Chronic obstructive pulmonary disease, unspecified: Secondary | ICD-10-CM | POA: Diagnosis not present

## 2020-04-23 DIAGNOSIS — R0602 Shortness of breath: Secondary | ICD-10-CM | POA: Diagnosis not present

## 2020-04-23 DIAGNOSIS — J479 Bronchiectasis, uncomplicated: Secondary | ICD-10-CM | POA: Diagnosis not present

## 2020-04-23 DIAGNOSIS — J440 Chronic obstructive pulmonary disease with acute lower respiratory infection: Secondary | ICD-10-CM | POA: Diagnosis not present

## 2020-04-23 DIAGNOSIS — R0781 Pleurodynia: Secondary | ICD-10-CM | POA: Diagnosis not present

## 2020-04-23 DIAGNOSIS — M797 Fibromyalgia: Secondary | ICD-10-CM | POA: Diagnosis not present

## 2020-04-23 DIAGNOSIS — J209 Acute bronchitis, unspecified: Secondary | ICD-10-CM | POA: Diagnosis not present

## 2020-04-23 DIAGNOSIS — I10 Essential (primary) hypertension: Secondary | ICD-10-CM | POA: Diagnosis not present

## 2020-04-23 DIAGNOSIS — F3341 Major depressive disorder, recurrent, in partial remission: Secondary | ICD-10-CM | POA: Diagnosis not present

## 2020-05-06 DIAGNOSIS — H353211 Exudative age-related macular degeneration, right eye, with active choroidal neovascularization: Secondary | ICD-10-CM | POA: Diagnosis not present

## 2020-05-27 ENCOUNTER — Ambulatory Visit: Payer: PPO | Admitting: Cardiovascular Disease

## 2020-05-27 DIAGNOSIS — R197 Diarrhea, unspecified: Secondary | ICD-10-CM | POA: Diagnosis not present

## 2020-05-27 NOTE — Progress Notes (Deleted)
Cardiology Office Note  Date:  05/27/2020   ID:  Lindsey, [name] 06/16/1936, MRN [id number]  PCP:  Lindsey Harrier, MD   No chief complaint on file.   HPI:  Lindsey [name] is a pleasant 84 year old woman with history of  lower extremity swelling,Secondary to venous insufficiency  prior right knee surgery,  right DVT, status post IVC filter,  bronchiectasis on inhalers and nebulizers at home  Back surgery who presents for follow up of her labile blood pressure and her leg swelling.  In the ER 09/2019, two times back to back HTN, migraine Records reviewed for both visits 176 systolic  Started on norvasc 2.5 mg daily by Dr. Ginette [name] BP better Dizzy/disoriented at times when she stands up Has not checked her blood pressure when standing  GERD sx, After vaccine had diarrhea Caused a flareup of her colitis  Continue ankle swelling Not using compressions Does not think it is worse after amlodipine started   has carotid disease done through [facility], was told she had moderate disease Records requested  Echocardiogram reviewed from [facility] clinic Mild to moderate MR with normal LV function  Total chol 205, LDL 81 On red yeast rice, discussed with her, she does not want a statin  EKG personally reviewed by myself on todays visit Shows normal sinus rhythm rate 86 bpm right bundle branch block left anterior fascicular block, PACs   Other past medical history reviewed visit to the emergency room ER 6/28,2018 Reported having dizziness and shortness of breath. Presented relatively acutely  Was drinking extra wine ,  stood up she felt dizzy/lightheaded and "breathing heavily".  Symptoms lasted approximately 2 hours  resolved.  Workup in the emergency room was benign Lab work as detailed below discussed with her Sodium 134, potassium 3.2, BUN 21 She does not take potassium supplement on a daily basis  Leg swelling History dates back many years but started with right knee  surgery after an accident, fracture. She suffered a DVT, had IVC filter placed. Swelling worse at the end of the day, better in the morning. She does have significant varicosities.   bronchiectasis diagnosed at outside facility.  stress test may 2015 which was reportedly normal  PMH:   has a past medical history of Anemia, Asthma, Breast cancer ([place]), Bronchiectasis ([place]) (06/2007), Cancer ([place]), Colitis, COPD (chronic obstructive pulmonary disease) ([place]), Depression, DVT (deep venous thrombosis) ([place]), Fibromyalgia, Fibromyalgia, Hypertension, Migraine headache with aura, Osteoarthritis, Osteoporosis, Pneumonia, and Polio (1952).  PSH:    Past Surgical History:  Procedure Laterality Date   ABDOMINAL HYSTERECTOMY     BACK SURGERY     BILATERAL TOTAL MASTECTOMY WITH AXILLARY LYMPH NODE DISSECTION     CARPAL TUNNEL RELEASE     bilateral    COLONOSCOPY WITH PROPOFOL N/A 08/02/2015   Procedure: COLONOSCOPY WITH PROPOFOL;  Surgeon: Lindsey Silvas, MD;  Location: [facility] ENDOSCOPY;  Service: Endoscopy;  Laterality: N/A;   FINGER TENDON REPAIR     HERNIA REPAIR     REPLACEMENT TOTAL KNEE     right knee    SQUAMOUS CELL CARCINOMA EXCISION     TONSILLECTOMY     UMBILICAL HERNIA REPAIR     VAGINAL HYSTERECTOMY      Current Outpatient Medications  Medication Sig Dispense Refill   albuterol (PROVENTIL HFA;VENTOLIN HFA) 108 (90 Base) MCG/ACT inhaler Inhale into the lungs every 6 (six) hours as needed for wheezing or shortness of breath.     ARIPiprazole (ABILIFY) 5 MG tablet Take 5 mg  by mouth daily.     aspirin EC 81 MG tablet Take 81 mg by mouth daily.     budesonide (ENTOCORT EC) 3 MG 24 hr capsule Take by mouth daily. Taking PRN based on diarrhea     cyclobenzaprine (FLEXERIL) 5 MG tablet Take 5 mg by mouth as needed.      doxazosin (CARDURA) 4 MG tablet TAKE 1/2 TABLET BY MOUTH TWICE DAILY 90 tablet 3   hydrALAZINE (APRESOLINE) 25 MG tablet Take 1 tablet (25 mg total) by  mouth as needed (for BP >170). 30 tablet 6   hydrochlorothiazide (HYDRODIURIL) 25 MG tablet TAKE 1 TABLET BY MOUTH DAILY 90 tablet 3   hydrOXYzine (ATARAX/VISTARIL) 25 MG tablet Take 25 mg by mouth 3 (three) times daily as needed (Pt. taking 1/2 a pill).     NON FORMULARY Take 2 capsules by mouth in the morning and at bedtime. Algaecal     NON FORMULARY Take 2 capsules by mouth daily. Strontium Boost     Omega-3 Fatty Acids (FISH OIL PO) Take 15 mLs by mouth daily. Triple power omega 3     polyethylene glycol (MIRALAX / GLYCOLAX) packet Take 17 g by mouth daily as needed. Not taking regularly      Red Yeast Rice Extract (RED YEAST RICE PO) Take 2 capsules by mouth daily.     Rimegepant Sulfate (NURTEC PO) Take by mouth as needed.     SYMBICORT 160-4.5 MCG/ACT inhaler Inhale 2 puffs into the lungs daily.      terconazole (TERAZOL 7) 0.4 % vaginal cream Place 1 applicator vaginally at bedtime as needed.      venlafaxine (EFFEXOR) 75 MG tablet Take 75 mg by mouth daily.     amLODipine (NORVASC) 2.5 MG tablet Take 1 tablet (2.5 mg total) by mouth daily. 180 tablet 3   Omega-3 Fatty Acids (FISH OIL) 1000 MG CAPS Take 1,000 mg by mouth daily.     predniSONE (STERAPRED UNI-PAK 21 TAB) 10 MG (21) TBPK tablet Take by mouth as needed.     No current facility-administered medications for this visit.     Allergies:   Ivp dye [iodinated diagnostic agents], Codeine, Sulfa antibiotics, Tegaderm ag mesh [silver], and Tetracyclines & related   Social History:  The patient  reports that she has never smoked. She has never used smokeless tobacco. She reports previous alcohol use of about 1.0 standard drink of alcohol per week. She reports that she does not use drugs.   Family History:   Family history is unknown by patient.    Review of Systems: Review of Systems  Constitutional: Negative.   Respiratory: Negative.   Cardiovascular: Negative.   Gastrointestinal: Negative.    Musculoskeletal: Positive for back pain.  Neurological: Positive for headaches.  Psychiatric/Behavioral: Negative.   All other systems reviewed and are negative.    PHYSICAL EXAM: VS:  There were no vitals taken for this visit. , BMI There is no height or weight on file to calculate BMI.  Constitutional:  oriented to person, place, and time. No distress.  HENT:  Head: Grossly normal Eyes:  no discharge. No scleral icterus.  Neck: No JVD, no carotid bruits  Cardiovascular: Regular rate and rhythm, no murmurs appreciated  1+ pitting edema around her ankles to lower shin  pulmonary/Chest: Clear to auscultation bilaterally, no wheezes or rails Abdominal: Soft.  no distension.  no tenderness.  Musculoskeletal: Normal range of motion Neurological:  normal muscle tone. Coordination normal. No atrophy Skin: Skin  warm and dry Psychiatric: normal affect, pleasant   Recent Labs: 01/26/2020: ALT 28; BUN 16; Creatinine, Ser 0.59; Hemoglobin 13.9; Platelets 194; Potassium 3.2; Sodium 131    Lipid Panel Lab Results  Component Value Date   CHOL 204 (H) 01/04/2020   HDL 113 01/04/2020   LDLCALC 84 01/04/2020   TRIG 37 01/04/2020      Wt Readings from Last 3 Encounters:  02/07/20 116 lb (52.6 kg)  01/26/20 117 lb (53.1 kg)  01/16/20 119 lb 6 oz (54.1 kg)     ASSESSMENT AND PLAN:  Labile blood pressure Recent exacerbation of hypertension in the setting of migraine headache Started on amlodipine 2.5 daily by primary care, numbers well controlled Some symptoms concerning for orthostasis, she will check numbers at home when she has symptoms For acute hypertension she will continue to take hydralazine  History of DVT (deep vein thrombosis) No recurrent episodes,  Discussed with her, she does not like to wear compression hose as a pinch Could try just a ankle compression device/sleeve  Bronchiectasis without complication (HCC) Breathing stable No recent exacerbations  Leg  swelling She does not feel it is worse on amlodipine Recommend leg elevation, a ankle compression sleeve  Orthostasis/dizziness Recommend she take amlodipine at dinnertime rather than morning Monitor blood pressure when she has dizziness when standing For low numbers may need to decrease her medication  PAD/carotid disease LDL above goal, recommended she add Zetia Discussed mechanism, Prescription sent in She does not want a statin   Long discussion with her concerning above  Total encounter time more than 35 minutes  Greater than 50% was spent in counseling and coordination of care with the patient   Disposition:   F/U  12 months   No orders of the defined types were placed in this encounter.    Signed, Esmond Plants, M.D., Ph.D. 05/27/2020  [place], [place]

## 2020-05-28 DIAGNOSIS — R197 Diarrhea, unspecified: Secondary | ICD-10-CM | POA: Diagnosis not present

## 2020-05-29 ENCOUNTER — Ambulatory Visit: Payer: PPO | Admitting: Cardiovascular Disease

## 2020-06-04 ENCOUNTER — Other Ambulatory Visit: Payer: Self-pay | Admitting: Student

## 2020-06-04 DIAGNOSIS — K529 Noninfective gastroenteritis and colitis, unspecified: Secondary | ICD-10-CM

## 2020-06-04 DIAGNOSIS — K8689 Other specified diseases of pancreas: Secondary | ICD-10-CM

## 2020-06-11 DIAGNOSIS — H353211 Exudative age-related macular degeneration, right eye, with active choroidal neovascularization: Secondary | ICD-10-CM | POA: Diagnosis not present

## 2020-06-13 DIAGNOSIS — H04123 Dry eye syndrome of bilateral lacrimal glands: Secondary | ICD-10-CM | POA: Diagnosis not present

## 2020-06-14 DIAGNOSIS — Z20828 Contact with and (suspected) exposure to other viral communicable diseases: Secondary | ICD-10-CM | POA: Diagnosis not present

## 2020-06-20 DIAGNOSIS — M5030 Other cervical disc degeneration, unspecified cervical region: Secondary | ICD-10-CM | POA: Diagnosis not present

## 2020-06-20 DIAGNOSIS — R4189 Other symptoms and signs involving cognitive functions and awareness: Secondary | ICD-10-CM | POA: Diagnosis not present

## 2020-06-20 DIAGNOSIS — G43119 Migraine with aura, intractable, without status migrainosus: Secondary | ICD-10-CM | POA: Diagnosis not present

## 2020-06-23 ENCOUNTER — Other Ambulatory Visit: Payer: Self-pay

## 2020-06-23 ENCOUNTER — Other Ambulatory Visit: Payer: Self-pay | Admitting: Student

## 2020-06-23 ENCOUNTER — Ambulatory Visit
Admission: RE | Admit: 2020-06-23 | Discharge: 2020-06-23 | Disposition: A | Discharge status: Home / Self Care | Payer: PPO | Source: Ambulatory Visit | Attending: Student | Admitting: Student

## 2020-06-23 DIAGNOSIS — K838 Other specified diseases of biliary tract: Secondary | ICD-10-CM | POA: Diagnosis not present

## 2020-06-23 DIAGNOSIS — K529 Noninfective gastroenteritis and colitis, unspecified: Secondary | ICD-10-CM

## 2020-06-23 DIAGNOSIS — K8689 Other specified diseases of pancreas: Secondary | ICD-10-CM

## 2020-07-15 NOTE — Progress Notes (Signed)
Cardiology Office Note  Date:  07/16/2020   ID:  Lindsey, Lindsey [name] 05-29-1936, MRN [id number]  PCP:  Tracie Harrier, MD   Chief Complaint  Patient presents with  . office visit    6 month F/U; Meds verbally reviewed with patient.    HPI:  [name] is a pleasant 84 year old woman with history of  lower extremity swelling,Secondary to venous insufficiency  prior right knee surgery,  right DVT, status post IVC filter,  bronchiectasis on inhalers and nebulizers at home  Back surgery who presents for follow up of her labile blood pressure and her leg swelling.  In follow-up today reports she is doing well Chronic left knee pain, may needs surgery Leg swelling, ankles Uses a roller/walker  Rare dizziness Tries to stay hydrated  Previous studies reviewed with her Echo 12/2019 EF 60%  Continues to have higher BP at night Sometimes 170, rare, and will take a hydralazine Last month only had to take hydralazine once or twice a month  Has not been in the emergency room recently In the ER 09/2019, two times back to back HTN, migraine 169 systolic  Does not like wearing compression hose  EKG personally reviewed by myself on todays visit Shows normal sinus rhythm rate 68 bpm right bundle branch block left anterior fascicular block  Other past medical history reviewed has carotid disease done through [facility], was told she had moderate disease  Echocardiogram reviewed from [facility] clinic Mild to moderate MR with normal LV function  Total chol 205, LDL 81 Does not want a statin  visit to the emergency room ER 6/28,2018 Reported having dizziness and shortness of breath. Presented relatively acutely  Was drinking extra wine ,  stood up she felt dizzy/lightheaded and "breathing heavily".  Symptoms lasted approximately 2 hours  resolved.  Workup in the emergency room was benign Lab work as detailed below discussed with her Sodium 134, potassium 3.2, BUN 21 She does  not take potassium supplement on a daily basis  Leg swelling History dates back many years but started with right knee surgery after an accident, fracture. She suffered a DVT, had IVC filter placed. Swelling worse at the end of the day, better in the morning. She does have significant varicosities.   bronchiectasis diagnosed at outside facility.  stress test may 2015 which was reportedly normal  PMH:   has a past medical history of Anemia, Asthma, Breast cancer ([place]), Bronchiectasis ([place]) (06/2007), Cancer ([place]), Colitis, COPD (chronic obstructive pulmonary disease) ([place]), Depression, DVT (deep venous thrombosis) ([place]), Fibromyalgia, Fibromyalgia, Hypertension, Migraine headache with aura, Osteoarthritis, Osteoporosis, Pneumonia, and Polio (1952).  PSH:    Past Surgical History:  Procedure Laterality Date  . ABDOMINAL HYSTERECTOMY    . BACK SURGERY    . BILATERAL TOTAL MASTECTOMY WITH AXILLARY LYMPH NODE DISSECTION    . CARPAL TUNNEL RELEASE     bilateral   . COLONOSCOPY WITH PROPOFOL N/A 08/02/2015   Procedure: COLONOSCOPY WITH PROPOFOL;  Surgeon: Manya Silvas, MD;  Location: [facility] ENDOSCOPY;  Service: Endoscopy;  Laterality: N/A;  . FINGER TENDON REPAIR    . HERNIA REPAIR    . REPLACEMENT TOTAL KNEE     right knee   . SQUAMOUS CELL CARCINOMA EXCISION    . TONSILLECTOMY    . UMBILICAL HERNIA REPAIR    . VAGINAL HYSTERECTOMY      Current Outpatient Medications on File Prior to Visit  Medication Sig Dispense Refill  . albuterol (PROVENTIL HFA;VENTOLIN HFA) 108 (90 Base)  MCG/ACT inhaler Inhale into the lungs every 6 (six) hours as needed for wheezing or shortness of breath.    . ARIPiprazole (ABILIFY) 2 MG tablet Take 2 mg by mouth daily.     . budesonide (ENTOCORT EC) 3 MG 24 hr capsule Take 9 mg by mouth daily.     . budesonide-formoterol (SYMBICORT) 160-4.5 MCG/ACT inhaler Inhale 2 puffs into the lungs in the morning and at bedtime.    Marland Kitchen doxazosin (CARDURA) 2 MG tablet Take 2  mg by mouth 2 (two) times daily.     Marland Kitchen ezetimibe (ZETIA) 10 MG tablet Take 1 tablet (10 mg total) by mouth daily. 90 tablet 3  . Galcanezumab-gnlm (EMGALITY) 120 MG/ML SOAJ Inject 120 mg into the skin every 30 (thirty) days.    . hydrALAZINE (APRESOLINE) 25 MG tablet Take 1 tablet (25 mg total) by mouth as needed (for BP >170). 30 tablet 6  . hydrochlorothiazide (HYDRODIURIL) 25 MG tablet Take 25 mg by mouth daily.    . hydrOXYzine (ATARAX/VISTARIL) 25 MG tablet Take 25 mg by mouth daily.     Marland Kitchen losartan (COZAAR) 25 MG tablet Take 1 tablet (25 mg total) by mouth daily. 30 tablet 4  . NON FORMULARY Take 2 capsules by mouth in the morning and at bedtime. Algaecal    . NON FORMULARY Take 2 capsules by mouth daily. Strontium Boost    . Omega-3 Fatty Acids (FISH OIL) 1000 MG CAPS Take 1,000 mg by mouth daily.    . polyethylene glycol (MIRALAX / GLYCOLAX) packet Take 17 g by mouth daily as needed for mild constipation or moderate constipation.     . potassium chloride (KLOR-CON M10) 10 MEQ tablet Take 1 tablet (10 mEq total) by mouth daily. 90 tablet 3  . rizatriptan (MAXALT) 5 MG tablet Take 5 mg by mouth as needed.     . venlafaxine XR (EFFEXOR-XR) 75 MG 24 hr capsule Take 75 mg by mouth daily with breakfast.    . hydrochlorothiazide (HYDRODIURIL) 25 MG tablet TAKE 1 TABLET BY MOUTH DAILY (Patient taking differently: Take 25 mg by mouth daily. ) 90 tablet 3   No current facility-administered medications on file prior to visit.    Allergies:   Ivp dye [iodinated diagnostic agents], Codeine, Sulfa antibiotics, Tegaderm ag mesh [silver], and Tetracyclines & related   Social History:  The patient  reports that she has never smoked. She has never used smokeless tobacco. She reports current alcohol use of about 1.0 standard drink of alcohol per week. She reports that she does not use drugs.   Family History:   family history includes Atrial fibrillation in her sister.    Review of Systems: Review of  Systems  Constitutional: Negative.   Respiratory: Negative.   Cardiovascular: Negative.   Gastrointestinal: Negative.   Musculoskeletal: Positive for back pain.  Neurological: Positive for headaches.  Psychiatric/Behavioral: Negative.   All other systems reviewed and are negative.   PHYSICAL EXAM: VS:  BP 126/82 (BP Location: Left Arm, Patient Position: Sitting, Cuff Size: Normal)   Pulse 68   Ht 5\' 2"  (1.575 m)   Wt 125 lb (56.7 kg)   SpO2 97%   BMI 22.86 kg/m  , BMI Body mass index is 22.86 kg/m.  Constitutional:  oriented to person, place, and time. No distress.  HENT:  Head: Grossly normal Eyes:  no discharge. No scleral icterus.  Neck: No JVD, no carotid bruits  Cardiovascular: Regular rate and rhythm, no murmurs appreciated Trace edema  around the ankles right greater than left Pulmonary/Chest: Clear to auscultation bilaterally, no wheezes or rails Abdominal: Soft.  no distension.  no tenderness.  Musculoskeletal: Normal range of motion Neurological:  normal muscle tone. Coordination normal. No atrophy Skin: Skin warm and dry Psychiatric: normal affect, pleasant   Recent Labs: 01/26/2020: ALT 28; BUN 16; Creatinine, Ser 0.59; Hemoglobin 13.9; Platelets 194; Potassium 3.2; Sodium 131    Lipid Panel Lab Results  Component Value Date   CHOL 204 (H) 01/04/2020   HDL 113 01/04/2020   LDLCALC 84 01/04/2020   TRIG 37 01/04/2020      Wt Readings from Last 3 Encounters:  07/16/20 125 lb (56.7 kg)  02/07/20 116 lb (52.6 kg)  01/26/20 117 lb (53.1 kg)     ASSESSMENT AND PLAN:  Labile blood pressure Relatively well controlled, takes extra hydralazine if blood pressure runs up in the evening  History of DVT (deep vein thrombosis) No recurrent episodes,  If knee surgery needed, could do prophylactic Xarelto following procedure  Bronchiectasis without complication (HCC) Breathing stable No exacerbations  Leg swelling Recommended ankle high socks Leg  elevation, movement on plane trips  Orthostasis/dizziness No significant symptoms, will avoid aggressive management of blood pressure, No changes made  PAD/carotid disease Does not want a statin, previously open to the Zetia   Total encounter time more than 25 minutes  Greater than 50% was spent in counseling and coordination of care with the patient   No orders of the defined types were placed in this encounter.    Signed, Esmond Plants, M.D., Ph.D. 07/16/2020  [place], [place]

## 2020-07-16 ENCOUNTER — Other Ambulatory Visit: Payer: Self-pay

## 2020-07-16 ENCOUNTER — Ambulatory Visit: Payer: PPO | Admitting: Cardiovascular Disease

## 2020-07-16 ENCOUNTER — Encounter: Payer: Self-pay | Admitting: Cardiovascular Disease

## 2020-07-16 VITALS — BP 126/82 | HR 68 | Ht 62.0 in | Wt 125.0 lb

## 2020-07-16 DIAGNOSIS — R0989 Other specified symptoms and signs involving the circulatory and respiratory systems: Secondary | ICD-10-CM | POA: Diagnosis not present

## 2020-07-16 DIAGNOSIS — I10 Essential (primary) hypertension: Secondary | ICD-10-CM | POA: Diagnosis not present

## 2020-07-16 DIAGNOSIS — G459 Transient cerebral ischemic attack, unspecified: Secondary | ICD-10-CM

## 2020-07-16 DIAGNOSIS — I739 Peripheral vascular disease, unspecified: Secondary | ICD-10-CM | POA: Diagnosis not present

## 2020-07-16 DIAGNOSIS — M7989 Other specified soft tissue disorders: Secondary | ICD-10-CM | POA: Diagnosis not present

## 2020-07-16 DIAGNOSIS — I6529 Occlusion and stenosis of unspecified carotid artery: Secondary | ICD-10-CM

## 2020-07-16 DIAGNOSIS — E782 Mixed hyperlipidemia: Secondary | ICD-10-CM

## 2020-07-16 MED ORDER — HYDROCHLOROTHIAZIDE 25 MG PO TABS: 25.0000 mg | ORAL_TABLET | Freq: Every day | ORAL | 3 refills | Status: DC

## 2020-07-16 MED ORDER — DOXAZOSIN MESYLATE 2 MG PO TABS: 2.0000 mg | ORAL_TABLET | Freq: Two times a day (BID) | ORAL | 3 refills | Status: DC

## 2020-07-16 MED ORDER — LOSARTAN POTASSIUM 25 MG PO TABS: 25.0000 mg | ORAL_TABLET | Freq: Every day | ORAL | 3 refills | Status: DC

## 2020-07-16 MED ORDER — POTASSIUM CHLORIDE CRYS ER 10 MEQ PO TBCR: 10.0000 meq | EXTENDED_RELEASE_TABLET | Freq: Every day | ORAL | 3 refills | Status: DC

## 2020-07-16 MED ORDER — HYDRALAZINE HCL 25 MG PO TABS: 25.0000 mg | ORAL_TABLET | ORAL | 3 refills | Status: DC | PRN

## 2020-07-16 MED ORDER — EZETIMIBE 10 MG PO TABS: 10.0000 mg | ORAL_TABLET | Freq: Every day | ORAL | 3 refills | Status: DC

## 2020-07-16 NOTE — Patient Instructions (Addendum)
Medication Instructions:  No changes  If you need a refill on your cardiac medications before your next appointment, please call your pharmacy.    Lab work: No new labs needed   If you have labs (blood work) drawn today and your tests are completely normal, you will receive your results only by: . MyChart Message (if you have MyChart) OR . A paper copy in the mail If you have any lab test that is abnormal or we need to change your treatment, we will call you to review the results.   Testing/Procedures: No new testing needed   Follow-Up: At [facility], you and your health needs are our priority.  As part of our continuing mission to provide you with exceptional heart care, we have created designated Provider Care Teams.  These Care Teams include your primary Cardiologist (physician) and Advanced Practice Providers (APPs -  Physician Assistants and Nurse Practitioners) who all work together to provide you with the care you need, when you need it.  . You will need a follow up appointment in 12 months  . Providers on your designated Care Team:   . Christopher Berge, NP . Ryan Dunn, PA-C . Jacquelyn Visser, PA-C  Any Other Special Instructions Will Be Listed Below (If Applicable).  COVID-19 Vaccine Information can be found at: [email or web address] For questions related to vaccine distribution or appointments, please email [email or web address] or call [phone].     

## 2020-07-17 ENCOUNTER — Telehealth: Payer: Self-pay | Admitting: Cardiovascular Disease

## 2020-07-17 NOTE — Telephone Encounter (Signed)
I do not see where Olmesartan has ever been prescribed for her. Amlodipine was discontinued due to LE edema.  If she has not been taking the Losartan, please let us know.   Present antihypertensive regimen should include: Cardura 2mg  BID Losartan 25mg  QD HCTZ 25mg  QD Hydralazine PRN for SBP >170  Loel Dubonnet, NP

## 2020-07-17 NOTE — Telephone Encounter (Signed)
Patient calling  States that she saw Dr Rockey Situ yesterday and we sent in all refills Patient states losartan was refilled but she is no longer taking this medication  Needs to clarify and update medication list Please call to discuss

## 2020-07-17 NOTE — Telephone Encounter (Signed)
Left voicemail message to call back  

## 2020-07-17 NOTE — Telephone Encounter (Signed)
Patient stated that she went to get her refills and thought that when she saw Urban Gibson the last time her losartan was stopped due to swelling. Reviewed notes and I see where they stopped the amlodipine and started her on olmesartan. Reviewed chart and then advised I would send to her for clarification and would give her a call back with recommendations. She verbalized understanding of our conversation, agreement with plan, and had no further questions at this time.

## 2020-07-19 ENCOUNTER — Other Ambulatory Visit: Payer: Self-pay | Admitting: Orthopedic Surgery

## 2020-07-19 DIAGNOSIS — M7062 Trochanteric bursitis, left hip: Secondary | ICD-10-CM | POA: Diagnosis not present

## 2020-07-19 DIAGNOSIS — M7061 Trochanteric bursitis, right hip: Secondary | ICD-10-CM | POA: Diagnosis not present

## 2020-07-19 DIAGNOSIS — M1712 Unilateral primary osteoarthritis, left knee: Secondary | ICD-10-CM | POA: Diagnosis not present

## 2020-07-23 DIAGNOSIS — H353211 Exudative age-related macular degeneration, right eye, with active choroidal neovascularization: Secondary | ICD-10-CM | POA: Diagnosis not present

## 2020-07-30 ENCOUNTER — Other Ambulatory Visit: Payer: Self-pay

## 2020-07-30 ENCOUNTER — Ambulatory Visit
Admission: RE | Admit: 2020-07-30 | Discharge: 2020-07-30 | Disposition: A | Discharge status: Home / Self Care | Payer: PPO | Source: Ambulatory Visit | Attending: Orthopedic Surgery | Admitting: Orthopedic Surgery

## 2020-07-30 DIAGNOSIS — M1612 Unilateral primary osteoarthritis, left hip: Secondary | ICD-10-CM | POA: Diagnosis not present

## 2020-07-30 DIAGNOSIS — M7122 Synovial cyst of popliteal space [Baker], left knee: Secondary | ICD-10-CM | POA: Diagnosis not present

## 2020-07-30 DIAGNOSIS — M1712 Unilateral primary osteoarthritis, left knee: Secondary | ICD-10-CM | POA: Diagnosis not present

## 2020-07-30 DIAGNOSIS — Z01818 Encounter for other preprocedural examination: Secondary | ICD-10-CM | POA: Diagnosis not present

## 2020-07-31 DIAGNOSIS — E876 Hypokalemia: Secondary | ICD-10-CM | POA: Diagnosis not present

## 2020-07-31 DIAGNOSIS — R5383 Other fatigue: Secondary | ICD-10-CM | POA: Diagnosis not present

## 2020-07-31 DIAGNOSIS — Z0000 Encounter for general adult medical examination without abnormal findings: Secondary | ICD-10-CM | POA: Diagnosis not present

## 2020-07-31 DIAGNOSIS — I10 Essential (primary) hypertension: Secondary | ICD-10-CM | POA: Diagnosis not present

## 2020-07-31 DIAGNOSIS — F32A Depression, unspecified: Secondary | ICD-10-CM | POA: Diagnosis not present

## 2020-07-31 DIAGNOSIS — R5381 Other malaise: Secondary | ICD-10-CM | POA: Diagnosis not present

## 2020-07-31 DIAGNOSIS — D649 Anemia, unspecified: Secondary | ICD-10-CM | POA: Diagnosis not present

## 2020-07-31 DIAGNOSIS — R0602 Shortness of breath: Secondary | ICD-10-CM | POA: Diagnosis not present

## 2020-08-14 DIAGNOSIS — K8681 Exocrine pancreatic insufficiency: Secondary | ICD-10-CM | POA: Diagnosis not present

## 2020-08-14 DIAGNOSIS — K52832 Lymphocytic colitis: Secondary | ICD-10-CM | POA: Diagnosis not present

## 2020-08-14 MED ORDER — TELMISARTAN 80 MG PO TABS: 80.0000 mg | ORAL_TABLET | Freq: Every day | ORAL | 2 refills | Status: DC

## 2020-08-14 NOTE — Telephone Encounter (Signed)
Okay to continue present antihypertensive regimen as reported by Lorrine Kin if it is keeping her BP well controlled. She had lab work in October with primary care which showed normal kidney function. Please remove Losartan from med list and provide refill of Telmisartan if needed.   Thank you for following up with her!!!  Loel Dubonnet, NP

## 2020-08-14 NOTE — Telephone Encounter (Signed)
Patient verbalized understanding of provider comments and will continue the telmisartan 80 mg daily.  Rx sent to pharmacy.

## 2020-08-14 NOTE — Telephone Encounter (Signed)
Spoke to patient to clarify meds.  She is taking Cardura 2 mg BID, HCTZ 25 mg, and Hydralazine prn. She is taking Telmesartan 80 mg daily and NOT losartan 25 mg daily.  She prefers the the [facility] because it did not cause swelling of the ankles like the losartan and amlodipine did.   Advised I will route to provider and let her know if any further changes.

## 2020-09-04 ENCOUNTER — Other Ambulatory Visit: Payer: Self-pay | Admitting: Orthopedic Surgery

## 2020-09-04 DIAGNOSIS — M25462 Effusion, left knee: Secondary | ICD-10-CM | POA: Diagnosis not present

## 2020-09-04 DIAGNOSIS — M1712 Unilateral primary osteoarthritis, left knee: Secondary | ICD-10-CM | POA: Diagnosis not present

## 2020-09-13 ENCOUNTER — Other Ambulatory Visit: Payer: Self-pay

## 2020-09-13 ENCOUNTER — Encounter
Admission: RE | Admit: 2020-09-13 | Discharge: 2020-09-13 | Disposition: A | Discharge status: Home / Self Care | Payer: PPO | Source: Ambulatory Visit | Attending: Orthopedic Surgery | Admitting: Orthopedic Surgery

## 2020-09-13 DIAGNOSIS — Z01812 Encounter for preprocedural laboratory examination: Secondary | ICD-10-CM | POA: Insufficient documentation

## 2020-09-13 HISTORY — DX: Occlusion and stenosis of unspecified carotid artery: I65.29

## 2020-09-13 HISTORY — DX: Peripheral vascular disease, unspecified: I73.9

## 2020-09-13 LAB — CBC WITH DIFFERENTIAL/PLATELET
Abs Immature Granulocytes: 0.03 10*3/uL (ref 0.00–0.07)
Basophils Absolute: 0.1 10*3/uL (ref 0.0–0.1)
Basophils Relative: 1 %
Eosinophils Absolute: 0.2 10*3/uL (ref 0.0–0.5)
Eosinophils Relative: 4 %
HCT: 41 % (ref 36.0–46.0)
Hemoglobin: 13.8 g/dL (ref 12.0–15.0)
Immature Granulocytes: 1 %
Lymphocytes Relative: 19 %
Lymphs Abs: 1 10*3/uL (ref 0.7–4.0)
MCH: 30.3 pg (ref 26.0–34.0)
MCHC: 33.7 g/dL (ref 30.0–36.0)
MCV: 90.1 fL (ref 80.0–100.0)
Monocytes Absolute: 0.6 10*3/uL (ref 0.1–1.0)
Monocytes Relative: 11 %
Neutro Abs: 3.5 10*3/uL (ref 1.7–7.7)
Neutrophils Relative %: 64 %
Platelets: 226 10*3/uL (ref 150–400)
RBC: 4.55 MIL/uL (ref 3.87–5.11)
RDW: 13.1 % (ref 11.5–15.5)
WBC: 5.3 10*3/uL (ref 4.0–10.5)
nRBC: 0 % (ref 0.0–0.2)

## 2020-09-13 LAB — URINALYSIS, ROUTINE W REFLEX MICROSCOPIC
Bilirubin Urine: NEGATIVE
Glucose, UA: NEGATIVE mg/dL
Hgb urine dipstick: NEGATIVE
Ketones, ur: NEGATIVE mg/dL
Leukocytes,Ua: NEGATIVE
Nitrite: NEGATIVE
Protein, ur: NEGATIVE mg/dL
Specific Gravity, Urine: 1.005 (ref 1.005–1.030)
pH: 8 (ref 5.0–8.0)

## 2020-09-13 LAB — COMPREHENSIVE METABOLIC PANEL
ALT: 24 U/L (ref 0–44)
AST: 49 U/L — ABNORMAL HIGH (ref 15–41)
Albumin: 4 g/dL (ref 3.5–5.0)
Alkaline Phosphatase: 59 U/L (ref 38–126)
Anion gap: 8 (ref 5–15)
BUN: 15 mg/dL (ref 8–23)
CO2: 32 mmol/L (ref 22–32)
Calcium: 9 mg/dL (ref 8.9–10.3)
Chloride: 93 mmol/L — ABNORMAL LOW (ref 98–111)
Creatinine, Ser: 0.54 mg/dL (ref 0.44–1.00)
GFR, Estimated: >60 mL/min (ref >60)
Glucose, Bld: 81 mg/dL (ref 70–99)
Potassium: 3.5 mmol/L (ref 3.5–5.1)
Sodium: 133 mmol/L — ABNORMAL LOW (ref 135–145)
Total Bilirubin: 1.1 mg/dL (ref 0.3–1.2)
Total Protein: 6.4 g/dL — ABNORMAL LOW (ref 6.5–8.1)

## 2020-09-13 LAB — SURGICAL PCR SCREEN
MRSA, PCR: NEGATIVE
Staphylococcus aureus: NEGATIVE

## 2020-09-13 LAB — TYPE AND SCREEN
ABO/RH(D): A NEG
Antibody Screen: NEGATIVE

## 2020-09-13 NOTE — Patient Instructions (Addendum)
Your procedure is scheduled on: 09/19/20-  Thursday Report to the Registration Desk on the 1st floor of the [facility]. To find out your arrival time, please call [phone] between 1PM - 3PM on: 09/18/20- Wednesday  REMEMBER: Instructions that are not followed completely may result in serious medical risk, up to and including death; or upon the discretion of your surgeon and anesthesiologist your surgery may need to be rescheduled.  Do not eat food after midnight the night before surgery.  No gum chewing, lozengers or hard candies.  You may however, drink CLEAR liquids up to 2 hours before you are scheduled to arrive for your surgery. Do not drink anything within 2 hours of your scheduled arrival time.  Clear liquids include: - water  - apple juice without pulp - gatorade (not RED, PURPLE, OR BLUE) - black coffee or tea (Do NOT add milk or creamers to the coffee or tea) Do NOT drink anything that is not on this list.  In addition, your doctor has ordered for you to drink the provided  Ensure Pre-Surgery Clear Carbohydrate Drink  Drinking this carbohydrate drink up to two hours before surgery helps to reduce insulin resistance and improve patient outcomes. Please complete drinking 2 hours prior to scheduled arrival time.  TAKE THESE MEDICATIONS THE MORNING OF SURGERY WITH A SIP OF WATER:  - ARIPiprazole (ABILIFY) 2 MG tablet - budesonide-formoterol (SYMBICORT) 160-4.5 MCG/ACT inhaler - doxazosin (CARDURA) 2 MG table - ezetimibe (ZETIA) 10 MG tablet - hydrOXYzine (ATARAX/VISTARIL) 25 MG tablet - venlafaxine XR (EFFEXOR-XR) 75 MG 24 hr capsule  Use inhaler albuterol (PROVENTIL HFA;VENTOLIN HFA) 108 (90 Base) MCG/ACT inhaler on the day of surgery and bring to the hospital.  One week prior to surgery: Stop Anti-inflammatories (NSAIDS) such as Advil, Aleve, Ibuprofen, Motrin, Naproxen, Naprosyn and Aspirin based products such as Excedrin, Goodys Powder, BC Powder . Stop ANY OVER  THE COUNTER supplements until after surgery. Omega-3 Fatty Acids, Algaecal,Strontium Boost   (However, you may continue taking Vitamin D, Vitamin B, and multivitamin up until the day before surgery.)  No Alcohol for 24 hours before or after surgery.  No Smoking including e-cigarettes for 24 hours prior to surgery.  No chewable tobacco products for at least 6 hours prior to surgery.  No nicotine patches on the day of surgery.  Do not use any "recreational" drugs for at least a week prior to your surgery.  Please be advised that the combination of cocaine and anesthesia may have negative outcomes, up to and including death. If you test positive for cocaine, your surgery will be cancelled.  On the morning of surgery brush your teeth with toothpaste and water, you may rinse your mouth with mouthwash if you wish. Do not swallow any toothpaste or mouthwash.  Do not wear jewelry, make-up, hairpins, clips or nail polish.  Do not wear lotions, powders, or perfumes.   Do not shave body from the neck down 48 hours prior to surgery just in case you cut yourself which could leave a site for infection.  Also, freshly shaved skin may become irritated if using the CHG soap.  Contact lenses, hearing aids and dentures may not be worn into surgery.  Do not bring valuables to the hospital. [facility] is not responsible for any missing/lost belongings or valuables.   Use CHG Soap or wipes as directed on instruction sheet.  Notify your doctor if there is any change in your medical condition (cold, fever, infection).  Wear comfortable clothing (  specific to your surgery type) to the hospital.  Plan for stool softeners for home use; pain medications have a tendency to cause constipation. You can also help prevent constipation by eating foods high in fiber such as fruits and vegetables and drinking plenty of fluids as your diet allows.  After surgery, you can help prevent lung complications by doing  breathing exercises.  Take deep breaths and cough every 1-2 hours. Your doctor may order a device called an Incentive Spirometer to help you take deep breaths. When coughing or sneezing, hold a pillow firmly against your incision with both hands. This is called "splinting." Doing this helps protect your incision. It also decreases belly discomfort.  If you are being admitted to the hospital overnight, leave your suitcase in the car. After surgery it may be brought to your room.  If you are being discharged the day of surgery, you will not be allowed to drive home. You will need a responsible adult (18 years or older) to drive you home and stay with you that night.   If you are taking public transportation, you will need to have a responsible adult (18 years or older) with you. Please confirm with your physician that it is acceptable to use public transportation.   Please call the [facility] at [phone] if you have any questions about these instructions.  Visitation Policy:  Patients undergoing a surgery or procedure may have one family member or support person with them as long as that person is not COVID-19 positive or experiencing its symptoms.  That person may remain in the waiting area during the procedure.  Inpatient Visitation Update:   In an effort to ensure the safety of our team members and our patients, we are implementing a change to our visitation policy:  Effective Monday, Aug. 9, at 7 a.m., inpatients will be allowed one support person.  o The support person may change daily.  o The support person must pass our screening, gel in and out, and wear a mask at all times, including in the patient's room.  o Patients must also wear a mask when staff or their support person are in the room.  o Masking is required regardless of vaccination status.  Systemwide, no visitors 17 or younger.

## 2020-09-16 DIAGNOSIS — H353211 Exudative age-related macular degeneration, right eye, with active choroidal neovascularization: Secondary | ICD-10-CM | POA: Diagnosis not present

## 2020-09-17 ENCOUNTER — Other Ambulatory Visit: Payer: Self-pay

## 2020-09-17 ENCOUNTER — Encounter
Admission: RE | Admit: 2020-09-17 | Discharge: 2020-09-17 | Disposition: A | Discharge status: Home / Self Care | Payer: PPO | Source: Ambulatory Visit | Attending: Internal Medicine | Admitting: Internal Medicine

## 2020-09-17 ENCOUNTER — Other Ambulatory Visit
Admission: RE | Admit: 2020-09-17 | Discharge: 2020-09-17 | Disposition: A | Discharge status: Home / Self Care | Payer: PPO | Source: Ambulatory Visit | Attending: Orthopedic Surgery | Admitting: Orthopedic Surgery

## 2020-09-17 DIAGNOSIS — Z20822 Contact with and (suspected) exposure to covid-19: Secondary | ICD-10-CM | POA: Diagnosis present

## 2020-09-17 DIAGNOSIS — Z471 Aftercare following joint replacement surgery: Secondary | ICD-10-CM | POA: Diagnosis not present

## 2020-09-17 DIAGNOSIS — M797 Fibromyalgia: Secondary | ICD-10-CM | POA: Diagnosis present

## 2020-09-17 DIAGNOSIS — I10 Essential (primary) hypertension: Secondary | ICD-10-CM | POA: Diagnosis present

## 2020-09-17 DIAGNOSIS — M1610 Unilateral primary osteoarthritis, unspecified hip: Secondary | ICD-10-CM | POA: Diagnosis present

## 2020-09-17 DIAGNOSIS — M7061 Trochanteric bursitis, right hip: Secondary | ICD-10-CM | POA: Diagnosis not present

## 2020-09-17 DIAGNOSIS — F32A Depression, unspecified: Secondary | ICD-10-CM | POA: Diagnosis present

## 2020-09-17 DIAGNOSIS — Z90710 Acquired absence of both cervix and uterus: Secondary | ICD-10-CM | POA: Diagnosis not present

## 2020-09-17 DIAGNOSIS — Z96651 Presence of right artificial knee joint: Secondary | ICD-10-CM | POA: Diagnosis present

## 2020-09-17 DIAGNOSIS — R262 Difficulty in walking, not elsewhere classified: Secondary | ICD-10-CM | POA: Diagnosis not present

## 2020-09-17 DIAGNOSIS — Z86718 Personal history of other venous thrombosis and embolism: Secondary | ICD-10-CM | POA: Diagnosis not present

## 2020-09-17 DIAGNOSIS — Z77120 Contact with and (suspected) exposure to mold (toxic): Secondary | ICD-10-CM | POA: Diagnosis not present

## 2020-09-17 DIAGNOSIS — F3340 Major depressive disorder, recurrent, in remission, unspecified: Secondary | ICD-10-CM | POA: Diagnosis not present

## 2020-09-17 DIAGNOSIS — M1712 Unilateral primary osteoarthritis, left knee: Secondary | ICD-10-CM | POA: Diagnosis present

## 2020-09-17 DIAGNOSIS — Z8612 Personal history of poliomyelitis: Secondary | ICD-10-CM | POA: Diagnosis not present

## 2020-09-17 DIAGNOSIS — Z882 Allergy status to sulfonamides status: Secondary | ICD-10-CM | POA: Diagnosis not present

## 2020-09-17 DIAGNOSIS — Z9013 Acquired absence of bilateral breasts and nipples: Secondary | ICD-10-CM | POA: Diagnosis not present

## 2020-09-17 DIAGNOSIS — J479 Bronchiectasis, uncomplicated: Secondary | ICD-10-CM | POA: Diagnosis not present

## 2020-09-17 DIAGNOSIS — E876 Hypokalemia: Secondary | ICD-10-CM | POA: Diagnosis present

## 2020-09-17 DIAGNOSIS — M7062 Trochanteric bursitis, left hip: Secondary | ICD-10-CM | POA: Diagnosis not present

## 2020-09-17 DIAGNOSIS — Z881 Allergy status to other antibiotic agents status: Secondary | ICD-10-CM | POA: Diagnosis not present

## 2020-09-17 DIAGNOSIS — Z91041 Radiographic dye allergy status: Secondary | ICD-10-CM | POA: Diagnosis not present

## 2020-09-17 DIAGNOSIS — Z8673 Personal history of transient ischemic attack (TIA), and cerebral infarction without residual deficits: Secondary | ICD-10-CM | POA: Diagnosis not present

## 2020-09-17 DIAGNOSIS — M810 Age-related osteoporosis without current pathological fracture: Secondary | ICD-10-CM | POA: Diagnosis present

## 2020-09-17 DIAGNOSIS — M6281 Muscle weakness (generalized): Secondary | ICD-10-CM | POA: Diagnosis not present

## 2020-09-17 DIAGNOSIS — M5030 Other cervical disc degeneration, unspecified cervical region: Secondary | ICD-10-CM | POA: Diagnosis not present

## 2020-09-17 DIAGNOSIS — Z9842 Cataract extraction status, left eye: Secondary | ICD-10-CM | POA: Diagnosis not present

## 2020-09-17 DIAGNOSIS — Z9841 Cataract extraction status, right eye: Secondary | ICD-10-CM | POA: Diagnosis not present

## 2020-09-17 DIAGNOSIS — R2689 Other abnormalities of gait and mobility: Secondary | ICD-10-CM | POA: Diagnosis not present

## 2020-09-17 DIAGNOSIS — Z96652 Presence of left artificial knee joint: Secondary | ICD-10-CM | POA: Diagnosis not present

## 2020-09-17 DIAGNOSIS — M2548 Effusion, other site: Secondary | ICD-10-CM | POA: Diagnosis not present

## 2020-09-17 DIAGNOSIS — Z79899 Other long term (current) drug therapy: Secondary | ICD-10-CM | POA: Diagnosis not present

## 2020-09-17 DIAGNOSIS — M25562 Pain in left knee: Secondary | ICD-10-CM | POA: Diagnosis not present

## 2020-09-17 DIAGNOSIS — J449 Chronic obstructive pulmonary disease, unspecified: Secondary | ICD-10-CM | POA: Diagnosis present

## 2020-09-17 DIAGNOSIS — Z01812 Encounter for preprocedural laboratory examination: Secondary | ICD-10-CM | POA: Insufficient documentation

## 2020-09-17 DIAGNOSIS — M7122 Synovial cyst of popliteal space [Baker], left knee: Secondary | ICD-10-CM | POA: Diagnosis not present

## 2020-09-17 DIAGNOSIS — M62838 Other muscle spasm: Secondary | ICD-10-CM | POA: Diagnosis not present

## 2020-09-17 DIAGNOSIS — I739 Peripheral vascular disease, unspecified: Secondary | ICD-10-CM | POA: Diagnosis present

## 2020-09-17 DIAGNOSIS — R279 Unspecified lack of coordination: Secondary | ICD-10-CM | POA: Diagnosis not present

## 2020-09-17 DIAGNOSIS — Z96642 Presence of left artificial hip joint: Secondary | ICD-10-CM | POA: Diagnosis not present

## 2020-09-17 DIAGNOSIS — M1990 Unspecified osteoarthritis, unspecified site: Secondary | ICD-10-CM | POA: Diagnosis not present

## 2020-09-17 DIAGNOSIS — G43119 Migraine with aura, intractable, without status migrainosus: Secondary | ICD-10-CM | POA: Diagnosis not present

## 2020-09-17 DIAGNOSIS — R5381 Other malaise: Secondary | ICD-10-CM | POA: Diagnosis not present

## 2020-09-17 DIAGNOSIS — Z853 Personal history of malignant neoplasm of breast: Secondary | ICD-10-CM | POA: Diagnosis not present

## 2020-09-17 LAB — SARS CORONAVIRUS 2 (TAT 6-24 HRS): SARS Coronavirus 2: NEGATIVE

## 2020-09-19 ENCOUNTER — Inpatient Hospital Stay: Payer: PPO | Admitting: Urgent Care

## 2020-09-19 ENCOUNTER — Other Ambulatory Visit: Payer: Self-pay

## 2020-09-19 ENCOUNTER — Inpatient Hospital Stay: Payer: PPO | Admitting: Anesthesiology

## 2020-09-19 ENCOUNTER — Inpatient Hospital Stay: Payer: PPO

## 2020-09-19 ENCOUNTER — Encounter
Admission: RE | Disposition: A | Discharge status: Skilled Nursing Facility | Payer: Self-pay | Source: Home / Self Care | Attending: Orthopedic Surgery

## 2020-09-19 ENCOUNTER — Inpatient Hospital Stay
Admission: RE | Admit: 2020-09-19 | Discharge: 2020-09-23 | DRG: 470 | Disposition: A | Discharge status: Skilled Nursing Facility | Payer: PPO | Attending: Orthopedic Surgery | Admitting: Orthopedic Surgery

## 2020-09-19 ENCOUNTER — Encounter: Payer: Self-pay | Admitting: Orthopedic Surgery

## 2020-09-19 DIAGNOSIS — I739 Peripheral vascular disease, unspecified: Secondary | ICD-10-CM | POA: Diagnosis present

## 2020-09-19 DIAGNOSIS — Z833 Family history of diabetes mellitus: Secondary | ICD-10-CM

## 2020-09-19 DIAGNOSIS — M810 Age-related osteoporosis without current pathological fracture: Secondary | ICD-10-CM | POA: Diagnosis present

## 2020-09-19 DIAGNOSIS — M797 Fibromyalgia: Secondary | ICD-10-CM | POA: Diagnosis present

## 2020-09-19 DIAGNOSIS — Z811 Family history of alcohol abuse and dependence: Secondary | ICD-10-CM

## 2020-09-19 DIAGNOSIS — Z806 Family history of leukemia: Secondary | ICD-10-CM

## 2020-09-19 DIAGNOSIS — Z77120 Contact with and (suspected) exposure to mold (toxic): Secondary | ICD-10-CM

## 2020-09-19 DIAGNOSIS — Z96651 Presence of right artificial knee joint: Secondary | ICD-10-CM | POA: Diagnosis present

## 2020-09-19 DIAGNOSIS — M1712 Unilateral primary osteoarthritis, left knee: Principal | ICD-10-CM | POA: Diagnosis present

## 2020-09-19 DIAGNOSIS — Z9013 Acquired absence of bilateral breasts and nipples: Secondary | ICD-10-CM

## 2020-09-19 DIAGNOSIS — Z9841 Cataract extraction status, right eye: Secondary | ICD-10-CM | POA: Diagnosis not present

## 2020-09-19 DIAGNOSIS — I10 Essential (primary) hypertension: Secondary | ICD-10-CM | POA: Diagnosis present

## 2020-09-19 DIAGNOSIS — Z853 Personal history of malignant neoplasm of breast: Secondary | ICD-10-CM | POA: Diagnosis not present

## 2020-09-19 DIAGNOSIS — Z20822 Contact with and (suspected) exposure to covid-19: Secondary | ICD-10-CM | POA: Diagnosis present

## 2020-09-19 DIAGNOSIS — Z818 Family history of other mental and behavioral disorders: Secondary | ICD-10-CM

## 2020-09-19 DIAGNOSIS — Z881 Allergy status to other antibiotic agents status: Secondary | ICD-10-CM | POA: Diagnosis not present

## 2020-09-19 DIAGNOSIS — J449 Chronic obstructive pulmonary disease, unspecified: Secondary | ICD-10-CM | POA: Diagnosis present

## 2020-09-19 DIAGNOSIS — Z91041 Radiographic dye allergy status: Secondary | ICD-10-CM | POA: Diagnosis not present

## 2020-09-19 DIAGNOSIS — Z8612 Personal history of poliomyelitis: Secondary | ICD-10-CM | POA: Diagnosis not present

## 2020-09-19 DIAGNOSIS — Z882 Allergy status to sulfonamides status: Secondary | ICD-10-CM

## 2020-09-19 DIAGNOSIS — G8918 Other acute postprocedural pain: Secondary | ICD-10-CM

## 2020-09-19 DIAGNOSIS — F32A Depression, unspecified: Secondary | ICD-10-CM | POA: Diagnosis present

## 2020-09-19 DIAGNOSIS — Z8262 Family history of osteoporosis: Secondary | ICD-10-CM

## 2020-09-19 DIAGNOSIS — Z823 Family history of stroke: Secondary | ICD-10-CM

## 2020-09-19 DIAGNOSIS — Z79899 Other long term (current) drug therapy: Secondary | ICD-10-CM | POA: Diagnosis not present

## 2020-09-19 DIAGNOSIS — Z90710 Acquired absence of both cervix and uterus: Secondary | ICD-10-CM

## 2020-09-19 DIAGNOSIS — Z825 Family history of asthma and other chronic lower respiratory diseases: Secondary | ICD-10-CM

## 2020-09-19 DIAGNOSIS — R52 Pain, unspecified: Secondary | ICD-10-CM

## 2020-09-19 DIAGNOSIS — Z9842 Cataract extraction status, left eye: Secondary | ICD-10-CM

## 2020-09-19 DIAGNOSIS — Z800 Family history of malignant neoplasm of digestive organs: Secondary | ICD-10-CM

## 2020-09-19 DIAGNOSIS — Z808 Family history of malignant neoplasm of other organs or systems: Secondary | ICD-10-CM

## 2020-09-19 DIAGNOSIS — E876 Hypokalemia: Secondary | ICD-10-CM | POA: Diagnosis present

## 2020-09-19 DIAGNOSIS — M1610 Unilateral primary osteoarthritis, unspecified hip: Secondary | ICD-10-CM | POA: Diagnosis present

## 2020-09-19 DIAGNOSIS — Z803 Family history of malignant neoplasm of breast: Secondary | ICD-10-CM

## 2020-09-19 DIAGNOSIS — Z86718 Personal history of other venous thrombosis and embolism: Secondary | ICD-10-CM | POA: Diagnosis not present

## 2020-09-19 DIAGNOSIS — Z8673 Personal history of transient ischemic attack (TIA), and cerebral infarction without residual deficits: Secondary | ICD-10-CM

## 2020-09-19 DIAGNOSIS — Z96653 Presence of artificial knee joint, bilateral: Secondary | ICD-10-CM

## 2020-09-19 DIAGNOSIS — Z8042 Family history of malignant neoplasm of prostate: Secondary | ICD-10-CM

## 2020-09-19 HISTORY — PX: TOTAL KNEE ARTHROPLASTY: SHX125

## 2020-09-19 LAB — CBC
HCT: 31 % — ABNORMAL LOW (ref 36.0–46.0)
Hemoglobin: 10.5 g/dL — ABNORMAL LOW (ref 12.0–15.0)
MCH: 30.3 pg (ref 26.0–34.0)
MCHC: 33.9 g/dL (ref 30.0–36.0)
MCV: 89.6 fL (ref 80.0–100.0)
Platelets: 179 10*3/uL (ref 150–400)
RBC: 3.46 MIL/uL — ABNORMAL LOW (ref 3.87–5.11)
RDW: 13.2 % (ref 11.5–15.5)
WBC: 8.5 10*3/uL (ref 4.0–10.5)
nRBC: 0 % (ref 0.0–0.2)

## 2020-09-19 LAB — CREATININE, SERUM
Creatinine, Ser: 0.47 mg/dL (ref 0.44–1.00)
GFR, Estimated: >60 mL/min (ref >60)

## 2020-09-19 SURGERY — ARTHROPLASTY, KNEE, TOTAL
Anesthesia: Spinal | Site: Knee | Laterality: Left

## 2020-09-19 MED ORDER — METHOCARBAMOL 1000 MG/10ML IJ SOLN
500.0000 mg | Freq: Four times a day (QID) | INTRAVENOUS | Status: DC | PRN
  Filled 2020-09-19: qty 5

## 2020-09-19 MED ORDER — ONDANSETRON HCL 4 MG PO TABS: 4.0000 mg | ORAL_TABLET | Freq: Four times a day (QID) | ORAL | Status: DC | PRN

## 2020-09-19 MED ORDER — FAMOTIDINE 20 MG PO TABS: 20.0000 mg | ORAL_TABLET | Freq: Once | ORAL | Status: AC

## 2020-09-19 MED ORDER — BUPIVACAINE-EPINEPHRINE (PF) 0.25% -1:200000 IJ SOLN
INTRAMUSCULAR | Status: DC | PRN
  Administered 2020-09-19: 30 mL

## 2020-09-19 MED ORDER — ONDANSETRON HCL 4 MG/2ML IJ SOLN: 4.0000 mg | Freq: Four times a day (QID) | INTRAMUSCULAR | Status: DC | PRN

## 2020-09-19 MED ORDER — ALBUTEROL SULFATE (2.5 MG/3ML) 0.083% IN NEBU: 2.5000 mg | INHALATION_SOLUTION | Freq: Four times a day (QID) | RESPIRATORY_TRACT | Status: DC | PRN

## 2020-09-19 MED ORDER — ALUM & MAG HYDROXIDE-SIMETH 200-200-20 MG/5ML PO SUSP: 30.0000 mL | ORAL | Status: DC | PRN

## 2020-09-19 MED ORDER — BUPIVACAINE-EPINEPHRINE (PF) 0.25% -1:200000 IJ SOLN
INTRAMUSCULAR | Status: AC
  Filled 2020-09-19: qty 30

## 2020-09-19 MED ORDER — CHLORHEXIDINE GLUCONATE 0.12 % MT SOLN: 15.0000 mL | Freq: Once | OROMUCOSAL | Status: AC

## 2020-09-19 MED ORDER — FLUTICASONE FUROATE-VILANTEROL 200-25 MCG/INH IN AEPB
1.0000 | INHALATION_SPRAY | Freq: Every day | RESPIRATORY_TRACT | Status: DC
  Administered 2020-09-20 – 2020-09-23 (×4): 1 via RESPIRATORY_TRACT
  Filled 2020-09-19: qty 28

## 2020-09-19 MED ORDER — LACTATED RINGERS IV SOLN: INTRAVENOUS | Status: DC

## 2020-09-19 MED ORDER — TRAMADOL HCL 50 MG PO TABS
ORAL_TABLET | ORAL | Status: AC
  Administered 2020-09-19: 14:00:00 50 mg via ORAL
  Filled 2020-09-19: qty 1

## 2020-09-19 MED ORDER — MORPHINE SULFATE (PF) 10 MG/ML IV SOLN
INTRAVENOUS | Status: AC
  Filled 2020-09-19: qty 1

## 2020-09-19 MED ORDER — EZETIMIBE 10 MG PO TABS
10.0000 mg | ORAL_TABLET | Freq: Every day | ORAL | Status: DC
  Administered 2020-09-20 – 2020-09-23 (×4): 10 mg via ORAL
  Filled 2020-09-19 (×4): qty 1

## 2020-09-19 MED ORDER — IRBESARTAN 150 MG PO TABS
300.0000 mg | ORAL_TABLET | Freq: Every day | ORAL | Status: DC
  Administered 2020-09-21 – 2020-09-23 (×3): 300 mg via ORAL
  Filled 2020-09-19 (×3): qty 2

## 2020-09-19 MED ORDER — BISACODYL 10 MG RE SUPP
10.0000 mg | Freq: Every day | RECTAL | Status: DC | PRN
  Administered 2020-09-22: 14:00:00 10 mg via RECTAL
  Filled 2020-09-19: qty 1

## 2020-09-19 MED ORDER — FAMOTIDINE 20 MG PO TABS
ORAL_TABLET | ORAL | Status: AC
  Administered 2020-09-19: 07:00:00 20 mg via ORAL
  Filled 2020-09-19: qty 1

## 2020-09-19 MED ORDER — PROPOFOL 10 MG/ML IV BOLUS
INTRAVENOUS | Status: AC
  Filled 2020-09-19: qty 20

## 2020-09-19 MED ORDER — CEFAZOLIN SODIUM-DEXTROSE 1-4 GM/50ML-% IV SOLN
INTRAVENOUS | Status: AC
  Filled 2020-09-19: qty 50

## 2020-09-19 MED ORDER — MIDAZOLAM HCL 2 MG/2ML IJ SOLN
INTRAMUSCULAR | Status: AC
  Filled 2020-09-19: qty 2

## 2020-09-19 MED ORDER — FENTANYL CITRATE (PF) 100 MCG/2ML IJ SOLN
25.0000 ug | INTRAMUSCULAR | Status: DC | PRN
Start: 2020-09-19 — End: 2020-09-19

## 2020-09-19 MED ORDER — PHENOL 1.4 % MT LIQD
1.0000 | OROMUCOSAL | Status: DC | PRN
  Filled 2020-09-19: qty 177

## 2020-09-19 MED ORDER — BUPIVACAINE HCL (PF) 0.5 % IJ SOLN
INTRAMUSCULAR | Status: DC | PRN
  Administered 2020-09-19: 2.5 mL

## 2020-09-19 MED ORDER — DOXAZOSIN MESYLATE 2 MG PO TABS
2.0000 mg | ORAL_TABLET | Freq: Two times a day (BID) | ORAL | Status: DC
  Administered 2020-09-19 – 2020-09-23 (×8): 2 mg via ORAL
  Filled 2020-09-19 (×10): qty 1

## 2020-09-19 MED ORDER — ENOXAPARIN SODIUM 30 MG/0.3ML ~~LOC~~ SOLN
30.0000 mg | Freq: Two times a day (BID) | SUBCUTANEOUS | Status: DC
  Administered 2020-09-20 – 2020-09-23 (×7): 30 mg via SUBCUTANEOUS
  Filled 2020-09-19 (×7): qty 0.3

## 2020-09-19 MED ORDER — METHOCARBAMOL 500 MG PO TABS
500.0000 mg | ORAL_TABLET | Freq: Four times a day (QID) | ORAL | Status: DC | PRN
  Administered 2020-09-23: 11:00:00 500 mg via ORAL
  Filled 2020-09-19: qty 1

## 2020-09-19 MED ORDER — HYDRALAZINE HCL 25 MG PO TABS: 25.0000 mg | ORAL_TABLET | ORAL | Status: DC | PRN

## 2020-09-19 MED ORDER — ARIPIPRAZOLE 2 MG PO TABS
2.0000 mg | ORAL_TABLET | Freq: Every day | ORAL | Status: DC
  Administered 2020-09-20 – 2020-09-23 (×4): 2 mg via ORAL
  Filled 2020-09-19 (×4): qty 1

## 2020-09-19 MED ORDER — MAGNESIUM CITRATE PO SOLN
1.0000 | Freq: Once | ORAL | Status: DC | PRN
  Filled 2020-09-19: qty 296

## 2020-09-19 MED ORDER — MENTHOL 3 MG MT LOZG
1.0000 | LOZENGE | OROMUCOSAL | Status: DC | PRN
  Filled 2020-09-19: qty 9

## 2020-09-19 MED ORDER — CHLORHEXIDINE GLUCONATE 0.12 % MT SOLN
OROMUCOSAL | Status: AC
  Administered 2020-09-19: 07:00:00 15 mL via OROMUCOSAL
  Filled 2020-09-19: qty 15

## 2020-09-19 MED ORDER — CEFAZOLIN SODIUM-DEXTROSE 1-4 GM/50ML-% IV SOLN
1.0000 g | Freq: Four times a day (QID) | INTRAVENOUS | Status: AC
  Administered 2020-09-19: 22:00:00 1 g via INTRAVENOUS
  Filled 2020-09-19: qty 50

## 2020-09-19 MED ORDER — MORPHINE SULFATE (PF) 2 MG/ML IV SOLN
INTRAVENOUS | Status: AC
  Administered 2020-09-19: 14:00:00 0.5 mg via INTRAVENOUS
  Filled 2020-09-19: qty 1

## 2020-09-19 MED ORDER — MIDAZOLAM HCL 5 MG/5ML IJ SOLN
INTRAMUSCULAR | Status: DC | PRN
  Administered 2020-09-19: 1 mg via INTRAVENOUS

## 2020-09-19 MED ORDER — PANTOPRAZOLE SODIUM 40 MG PO TBEC
40.0000 mg | DELAYED_RELEASE_TABLET | Freq: Every day | ORAL | Status: DC
  Administered 2020-09-20 – 2020-09-23 (×4): 40 mg via ORAL
  Filled 2020-09-19 (×4): qty 1

## 2020-09-19 MED ORDER — METOCLOPRAMIDE HCL 5 MG/ML IJ SOLN: 5.0000 mg | Freq: Three times a day (TID) | INTRAMUSCULAR | Status: DC | PRN

## 2020-09-19 MED ORDER — METOCLOPRAMIDE HCL 10 MG PO TABS: 5.0000 mg | ORAL_TABLET | Freq: Three times a day (TID) | ORAL | Status: DC | PRN

## 2020-09-19 MED ORDER — HYDROCODONE-ACETAMINOPHEN 7.5-325 MG PO TABS
1.0000 | ORAL_TABLET | ORAL | Status: DC | PRN
  Administered 2020-09-19 – 2020-09-20 (×2): 1 via ORAL
  Administered 2020-09-20 – 2020-09-21 (×2): 2 via ORAL
  Administered 2020-09-21 – 2020-09-22 (×2): 1 via ORAL
  Administered 2020-09-23: 2 via ORAL
  Administered 2020-09-23: 1 via ORAL
  Filled 2020-09-19: qty 1
  Filled 2020-09-19: qty 2
  Filled 2020-09-19: qty 1
  Filled 2020-09-19 (×3): qty 2
  Filled 2020-09-19: qty 1
  Filled 2020-09-19: qty 2
  Filled 2020-09-19 (×2): qty 1

## 2020-09-19 MED ORDER — GALCANEZUMAB-GNLM 120 MG/ML ~~LOC~~ SOAJ: 120.0000 mg | SUBCUTANEOUS | Status: DC

## 2020-09-19 MED ORDER — POLYETHYLENE GLYCOL 3350 17 G PO PACK
17.0000 g | PACK | Freq: Every day | ORAL | Status: DC | PRN
  Administered 2020-09-22: 08:00:00 17 g via ORAL
  Filled 2020-09-19: qty 1

## 2020-09-19 MED ORDER — HYDROXYZINE HCL 25 MG PO TABS
25.0000 mg | ORAL_TABLET | ORAL | Status: DC | PRN
  Filled 2020-09-19: qty 1

## 2020-09-19 MED ORDER — POTASSIUM CHLORIDE CRYS ER 10 MEQ PO TBCR
10.0000 meq | EXTENDED_RELEASE_TABLET | Freq: Every day | ORAL | Status: DC
  Administered 2020-09-20 – 2020-09-23 (×4): 10 meq via ORAL
  Filled 2020-09-19 (×4): qty 1

## 2020-09-19 MED ORDER — PROPOFOL 10 MG/ML IV BOLUS
INTRAVENOUS | Status: DC | PRN
  Administered 2020-09-19: 20 mg via INTRAVENOUS
  Administered 2020-09-19: 30 mg via INTRAVENOUS

## 2020-09-19 MED ORDER — SUMATRIPTAN SUCCINATE 50 MG PO TABS
50.0000 mg | ORAL_TABLET | ORAL | Status: DC | PRN
  Filled 2020-09-19: qty 1

## 2020-09-19 MED ORDER — BUPIVACAINE LIPOSOME 1.3 % IJ SUSP
INTRAMUSCULAR | Status: AC
  Filled 2020-09-19: qty 20

## 2020-09-19 MED ORDER — SODIUM CHLORIDE 0.9 % IV SOLN: INTRAVENOUS | Status: DC

## 2020-09-19 MED ORDER — PROPOFOL 500 MG/50ML IV EMUL
INTRAVENOUS | Status: AC
  Filled 2020-09-19: qty 50

## 2020-09-19 MED ORDER — SODIUM CHLORIDE FLUSH 0.9 % IV SOLN
INTRAVENOUS | Status: AC
  Filled 2020-09-19: qty 40

## 2020-09-19 MED ORDER — DIPHENHYDRAMINE HCL 12.5 MG/5ML PO ELIX: 12.5000 mg | ORAL_SOLUTION | ORAL | Status: DC | PRN

## 2020-09-19 MED ORDER — HYDROCODONE-ACETAMINOPHEN 5-325 MG PO TABS
1.0000 | ORAL_TABLET | ORAL | Status: DC | PRN
  Administered 2020-09-19: 1 via ORAL
  Administered 2020-09-20 (×2): 2 via ORAL
  Administered 2020-09-21 – 2020-09-22 (×2): 1 via ORAL
  Filled 2020-09-19: qty 1
  Filled 2020-09-19: qty 2
  Filled 2020-09-19 (×2): qty 1
  Filled 2020-09-19: qty 2
  Filled 2020-09-19: qty 1
  Filled 2020-09-19: qty 2

## 2020-09-19 MED ORDER — SODIUM CHLORIDE 0.9 % IV SOLN
INTRAVENOUS | Status: DC | PRN
  Administered 2020-09-19: 08:00:00 60 mL

## 2020-09-19 MED ORDER — NEOMYCIN-POLYMYXIN B GU 40-200000 IR SOLN
Status: AC
  Filled 2020-09-19: qty 2

## 2020-09-19 MED ORDER — CEFAZOLIN SODIUM-DEXTROSE 1-4 GM/50ML-% IV SOLN
INTRAVENOUS | Status: AC
  Administered 2020-09-19: 14:00:00 1 g via INTRAVENOUS
  Filled 2020-09-19: qty 50

## 2020-09-19 MED ORDER — CEFAZOLIN SODIUM-DEXTROSE 1-4 GM/50ML-% IV SOLN
1.0000 g | INTRAVENOUS | Status: AC
  Administered 2020-09-19: 07:00:00 1 g via INTRAVENOUS

## 2020-09-19 MED ORDER — MORPHINE SULFATE (PF) 2 MG/ML IV SOLN: 0.5000 mg | INTRAVENOUS | Status: DC | PRN

## 2020-09-19 MED ORDER — ONDANSETRON HCL 4 MG/2ML IJ SOLN: 4.0000 mg | Freq: Once | INTRAMUSCULAR | Status: DC | PRN

## 2020-09-19 MED ORDER — DOCUSATE SODIUM 100 MG PO CAPS
100.0000 mg | ORAL_CAPSULE | Freq: Two times a day (BID) | ORAL | Status: DC
  Administered 2020-09-20 – 2020-09-23 (×7): 100 mg via ORAL
  Filled 2020-09-19 (×8): qty 1

## 2020-09-19 MED ORDER — ZOLPIDEM TARTRATE 5 MG PO TABS: 5.0000 mg | ORAL_TABLET | Freq: Every evening | ORAL | Status: DC | PRN

## 2020-09-19 MED ORDER — ORAL CARE MOUTH RINSE: 15.0000 mL | Freq: Once | OROMUCOSAL | Status: AC

## 2020-09-19 MED ORDER — VENLAFAXINE HCL ER 75 MG PO CP24
75.0000 mg | ORAL_CAPSULE | Freq: Every day | ORAL | Status: DC
  Administered 2020-09-20 – 2020-09-23 (×4): 75 mg via ORAL
  Filled 2020-09-19 (×4): qty 1

## 2020-09-19 MED ORDER — HYDROCHLOROTHIAZIDE 25 MG PO TABS
25.0000 mg | ORAL_TABLET | Freq: Every day | ORAL | Status: DC
  Administered 2020-09-20 – 2020-09-23 (×4): 25 mg via ORAL
  Filled 2020-09-19 (×4): qty 1

## 2020-09-19 MED ORDER — TRAMADOL HCL 50 MG PO TABS
50.0000 mg | ORAL_TABLET | Freq: Four times a day (QID) | ORAL | Status: DC
  Administered 2020-09-20 – 2020-09-23 (×8): 50 mg via ORAL
  Filled 2020-09-19 (×12): qty 1

## 2020-09-19 MED ORDER — ACETAMINOPHEN 325 MG PO TABS: 325.0000 mg | ORAL_TABLET | Freq: Four times a day (QID) | ORAL | Status: DC | PRN

## 2020-09-19 MED ORDER — SODIUM CHLORIDE 0.9 % IV SOLN
INTRAVENOUS | Status: DC | PRN
  Administered 2020-09-19: 07:00:00 50 ug/min via INTRAVENOUS

## 2020-09-19 MED ORDER — MORPHINE SULFATE (PF) 10 MG/ML IV SOLN
INTRAVENOUS | Status: DC | PRN
  Administered 2020-09-19: 10 mg

## 2020-09-19 MED ORDER — PROPOFOL 500 MG/50ML IV EMUL
INTRAVENOUS | Status: DC | PRN
  Administered 2020-09-19: 50 ug/kg/min via INTRAVENOUS

## 2020-09-19 SURGICAL SUPPLY — 71 items
BLADE SAGITTAL 25.0X1.19X90 (BLADE) ×2 IMPLANT
BLADE SAGITTAL 25.0X1.19X90MM (BLADE) ×1
BLADE SAW 90X13X1.19 OSCILLAT (BLADE) ×3 IMPLANT
BNDG ELASTIC 6X5.8 VLCR STR LF (GAUZE/BANDAGES/DRESSINGS) ×3 IMPLANT
CANISTER SUCT 1200ML W/VALVE (MISCELLANEOUS) ×3 IMPLANT
CANISTER SUCT 3000ML PPV (MISCELLANEOUS) ×6 IMPLANT
CANISTER WOUND CARE 500ML ATS (WOUND CARE) ×3 IMPLANT
CEMENT HV SMART SET (Cement) ×6 IMPLANT
CHLORAPREP W/TINT 26 (MISCELLANEOUS) ×6 IMPLANT
COOLER POLAR GLACIER W/PUMP (MISCELLANEOUS) ×3 IMPLANT
COVER WAND RF STERILE (DRAPES) ×3 IMPLANT
CUFF TOURN SGL QUICK 24 (TOURNIQUET CUFF)
CUFF TOURN SGL QUICK 30 (TOURNIQUET CUFF)
CUFF TRNQT CYL 24X4X16.5-23 (TOURNIQUET CUFF) IMPLANT
CUFF TRNQT CYL 30X4X21-28X (TOURNIQUET CUFF) IMPLANT
DRAPE 3/4 80X56 (DRAPES) ×6 IMPLANT
DRSG MEPILEX SACRM 8.7X9.8 (GAUZE/BANDAGES/DRESSINGS) ×3 IMPLANT
ELECT CAUTERY BLADE 6.4 (BLADE) ×3 IMPLANT
ELECT REM PT RETURN 9FT ADLT (ELECTROSURGICAL) ×3
ELECTRODE REM PT RTRN 9FT ADLT (ELECTROSURGICAL) ×1 IMPLANT
FEMORAL COMP CEMENTED SZ3 L (Femur) ×3 IMPLANT
GAUZE SPONGE 4X4 12PLY STRL (GAUZE/BANDAGES/DRESSINGS) ×3 IMPLANT
GAUZE XEROFORM 1X8 LF (GAUZE/BANDAGES/DRESSINGS) ×3 IMPLANT
GLOVE BIOGEL PI IND STRL 9 (GLOVE) ×1 IMPLANT
GLOVE BIOGEL PI INDICATOR 9 (GLOVE) ×2
GLOVE INDICATOR 8.0 STRL GRN (GLOVE) ×3 IMPLANT
GLOVE SURG ORTHO 8.0 STRL STRW (GLOVE) ×3 IMPLANT
GLOVE SURG SYN 9.0  PF PI (GLOVE) ×2
GLOVE SURG SYN 9.0 PF PI (GLOVE) ×1 IMPLANT
GOWN SRG 2XL LVL 4 RGLN SLV (GOWNS) ×1 IMPLANT
GOWN STRL NON-REIN 2XL LVL4 (GOWNS) ×2
GOWN STRL REUS W/ TWL LRG LVL3 (GOWN DISPOSABLE) ×1 IMPLANT
GOWN STRL REUS W/ TWL XL LVL3 (GOWN DISPOSABLE) ×1 IMPLANT
GOWN STRL REUS W/TWL LRG LVL3 (GOWN DISPOSABLE) ×2
GOWN STRL REUS W/TWL XL LVL3 (GOWN DISPOSABLE) ×2
HOLDER FOLEY CATH W/STRAP (MISCELLANEOUS) ×3 IMPLANT
HOOD PEEL AWAY FLYTE STAYCOOL (MISCELLANEOUS) ×6 IMPLANT
INSERT TIBIAL SZ2 LEFT (Insert) ×3 IMPLANT
IRRIGATION SURGIPHOR STRL (IV SOLUTION) IMPLANT
KIT PREVENA INCISION MGT20CM45 (CANNISTER) ×3 IMPLANT
KIT TURNOVER KIT A (KITS) ×3 IMPLANT
MANIFOLD NEPTUNE II (INSTRUMENTS) ×3 IMPLANT
NDL SAFETY ECLIPSE 18X1.5 (NEEDLE) ×1 IMPLANT
NEEDLE HYPO 18GX1.5 SHARP (NEEDLE) ×2
NEEDLE SPNL 18GX3.5 QUINCKE PK (NEEDLE) ×3 IMPLANT
NEEDLE SPNL 20GX3.5 QUINCKE YW (NEEDLE) ×3 IMPLANT
NS IRRIG 1000ML POUR BTL (IV SOLUTION) ×3 IMPLANT
PACK TOTAL KNEE (MISCELLANEOUS) ×3 IMPLANT
PAD WRAPON POLAR KNEE (MISCELLANEOUS) ×1 IMPLANT
PATELLA RESURFACING MEDACTA 02 (Bone Implant) ×3 IMPLANT
PENCIL SMOKE EVACUATOR COATED (MISCELLANEOUS) ×3 IMPLANT
PULSAVAC PLUS IRRIG FAN TIP (DISPOSABLE) ×3
SCALPEL PROTECTED #10 DISP (BLADE) ×6 IMPLANT
SOL .9 NS 3000ML IRR  AL (IV SOLUTION) ×2
SOL .9 NS 3000ML IRR UROMATIC (IV SOLUTION) ×1 IMPLANT
STAPLER SKIN PROX 35W (STAPLE) ×3 IMPLANT
STEM EXTENSION 11MMX30MM (Stem) ×3 IMPLANT
SUCTION FRAZIER HANDLE 10FR (MISCELLANEOUS) ×2
SUCTION TUBE FRAZIER 10FR DISP (MISCELLANEOUS) ×1 IMPLANT
SUT DVC 2 QUILL PDO  T11 36X36 (SUTURE) ×2
SUT DVC 2 QUILL PDO T11 36X36 (SUTURE) ×1 IMPLANT
SUT ETHIBOND 2 V 37 (SUTURE) IMPLANT
SUT V-LOC 90 ABS DVC 3-0 CL (SUTURE) ×3 IMPLANT
SYR 20ML LL LF (SYRINGE) ×3 IMPLANT
SYR 50ML LL SCALE MARK (SYRINGE) ×6 IMPLANT
TIBIAL TRAY FIXED MEDACTA 0207 (Joint) ×3 IMPLANT
TIP FAN IRRIG PULSAVAC PLUS (DISPOSABLE) ×1 IMPLANT
TOWEL OR 17X26 4PK STRL BLUE (TOWEL DISPOSABLE) ×3 IMPLANT
TOWER CARTRIDGE SMART MIX (DISPOSABLE) ×3 IMPLANT
TRAY FOLEY MTR SLVR 16FR STAT (SET/KITS/TRAYS/PACK) ×3 IMPLANT
WRAPON POLAR PAD KNEE (MISCELLANEOUS) ×3

## 2020-09-19 NOTE — Progress Notes (Signed)
   09/19/20 0740  Clinical Encounter Type  Visited With Family  Visit Type Initial  Referral From Chaplain  Consult/Referral To Chaplain  While rounding SDS waiting area, chaplain visited with Pt's daughter-in-law, [place] from [place]. She came down to be with Pt so she would not be alone and her husband, Pt's son will be down when Pt goes to rehab. Per Pt's daughter-in-law she is having a knee replacement and will be in the hospital for a few days. 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 Colly told chaplain that what she was going was good and that chaplain  made her feel good. Chaplain thanked her and wished her a pleasant and great holiday.

## 2020-09-19 NOTE — Evaluation (Signed)
Physical Therapy Evaluation Patient Details Name: Lindsey [name] MRN: [id number] DOB: 05/23/1936 Today's Date: 09/19/2020   History of Present Illness  Pt is an 84 yo female diagnosed with OA of the L knee and is s/p elective L TKA.  PMH includes: HTN, PVD, COPD, PNA, breast CA, fibromyalgia, osteopenia, R TKA, and back surgery.    Clinical Impression  Pt was pleasant and motivated to participate during the session but was somewhat lethargic requiring occasional verbal stimulation to prevent falling asleep.  Pt put forth good effort during the session despite feeling tired and overall performed very well especially considering POD#0 status.  Pt required very little physical assistance with bed mobility tasks and was able to stand without help.  In standing pt was able to weight shift left/right with good stability when shifting to her LLE and was able to perform LLE marches in place with relative ease.  Pt then began to c/o nausea and requested to return to sitting.  Pt reported feeling better upon sitting with session ended.  Pt is expected to make good progress while in acute care and will benefit from HHPT services upon discharge to safely address deficits listed in patient problem list for decreased caregiver assistance and eventual return to PLOF.      Follow Up Recommendations Home health PT;Supervision for mobility/OOB    Equipment Recommendations  Rolling walker with 5" wheels    Recommendations for Other Services       Precautions / Restrictions Precautions Precautions: Fall Restrictions Weight Bearing Restrictions: Yes LLE Weight Bearing: Weight bearing as tolerated      Mobility  Bed Mobility Overal bed mobility: Needs Assistance Bed Mobility: Supine to Sit;Sit to Supine     Supine to sit: Min assist Sit to supine: Min assist   General bed mobility comments: Min A for LLE control    Transfers Overall transfer level: Needs assistance Equipment used: Rolling  walker (2 wheeled) Transfers: Sit to/from Stand Sit to Stand: Min guard         General transfer comment: Good eccentric and concentric control and stability  Ambulation/Gait             General Gait Details: Unable to attempt amb secondary to nausea in standing  Stairs            Wheelchair Mobility    Modified Rankin (Stroke Patients Only)       Balance Overall balance assessment: Needs assistance   Sitting balance-Leahy Scale: Normal     Standing balance support: Bilateral upper extremity supported;During functional activity Standing balance-Leahy Scale: Good                               Pertinent Vitals/Pain Pain Assessment: 0-10 Pain Score: 4  Pain Location: L knee Pain Descriptors / Indicators: Sore Pain Intervention(s): Premedicated before session;Monitored during session    Home Living Family/patient expects to be discharged to:: Private residence Living Arrangements: Alone Available Help at Discharge: Friend(s);Available PRN/intermittently Type of Home: House Home Access: Level entry     Home Layout: One level Home Equipment: Walker - 4 wheels;Cane - single point Additional Comments: Elevated toilet with arm rests    Prior Function Level of Independence: Independent         Comments: Ind amb community distances without an AD, no fall history, rare use of a rollator "usually when I'm going to the airport", Ind with ADLs, drives  Hand Dominance        Extremity/Trunk Assessment   Upper Extremity Assessment Upper Extremity Assessment: Overall WFL for tasks assessed    Lower Extremity Assessment Lower Extremity Assessment: Generalized weakness;LLE deficits/detail LLE Deficits / Details: BLE ankle strength and AROM WNL LLE: Unable to fully assess due to pain LLE Sensation: WNL       Communication   Communication: No difficulties  Cognition Arousal/Alertness: Lethargic Behavior During Therapy: WFL for tasks  assessed/performed Overall Cognitive Status: Within Functional Limits for tasks assessed                                        General Comments      Exercises Total Joint Exercises Ankle Circles/Pumps: AROM;Strengthening;Both;10 reps;5 reps Quad Sets: AROM;Strengthening;Left;5 reps;10 reps Hip ABduction/ADduction: AROM;AAROM;Both;5 reps;10 reps (AAROM on the LLE) Straight Leg Raises: AAROM;AROM;Both;5 reps;10 reps (AAROM on the LLE) Long Arc Quad: AROM;Left;10 reps;15 reps Knee Flexion: AROM;Left;10 reps;15 reps Goniometric ROM: L knee AROM: 8-74 deg Marching in Standing: AROM;Left;5 reps;Standing Other Exercises: standing weight shifting left/right Other Exercises: Positioning education to encourage L knee ext PROM Other Exercises: HEP education and review per handout   Assessment/Plan    PT Assessment Patient needs continued PT services  PT Problem List Decreased strength;Decreased range of motion;Decreased activity tolerance;Decreased balance;Decreased mobility;Decreased knowledge of use of DME;Pain       PT Treatment Interventions DME instruction;Gait training;Stair training;Functional mobility training;Therapeutic activities;Therapeutic exercise;Balance training;Patient/family education    PT Goals (Current goals can be found in the Care Plan section)  Acute Rehab PT Goals Patient Stated Goal: To walk better without pain PT Goal Formulation: With patient Time For Goal Achievement: 10/02/20 Potential to Achieve Goals: Good    Frequency BID   Barriers to discharge        Co-evaluation               AM-PAC PT "6 Clicks" Mobility  Outcome Measure Help needed turning from your back to your side while in a flat bed without using bedrails?: A Little Help needed moving from lying on your back to sitting on the side of a flat bed without using bedrails?: A Little Help needed moving to and from a bed to a chair (including a wheelchair)?: A  Little Help needed standing up from a chair using your arms (e.g., wheelchair or bedside chair)?: A Little Help needed to walk in hospital room?: A Lot Help needed climbing 3-5 steps with a railing? : A Lot 6 Click Score: 16    End of Session Equipment Utilized During Treatment: Gait belt Activity Tolerance: Other (comment) (Amb attempt limited secondary to nausea) Patient left: in bed;with call bell/phone within reach;with bed alarm set;with SCD's reapplied;Other (comment) (polar care donned to L knee) Nurse Communication: Mobility status;Weight bearing status PT Visit Diagnosis: Other abnormalities of gait and mobility (R26.89);Muscle weakness (generalized) (M62.81);Pain Pain - Right/Left: Left Pain - part of body: Knee    Time: 2703-5009 PT Time Calculation (min) (ACUTE ONLY): 46 min   Charges:   PT Evaluation $PT Eval Moderate Complexity: 1 Mod PT Treatments $Therapeutic Exercise: 8-22 mins $Therapeutic Activity: 8-22 mins        D. Royetta Asal PT, DPT 09/19/20, 5:36 PM

## 2020-09-19 NOTE — Anesthesia Postprocedure Evaluation (Signed)
Anesthesia Post Note  Patient: Lindsey [name]  Procedure(s) Performed: TOTAL KNEE ARTHROPLASTY (Left Knee)  Patient location during evaluation: Nursing Unit Anesthesia Type: Spinal Level of consciousness: oriented and awake and alert Pain management: pain level controlled Vital Signs Assessment: post-procedure vital signs reviewed and stable Respiratory status: spontaneous breathing and respiratory function stable Cardiovascular status: blood pressure returned to baseline and stable Postop Assessment: no headache, no backache, no apparent nausea or vomiting and patient able to bend at knees Anesthetic complications: no   No complications documented.   Last Vitals:  Vitals:   09/19/20 0922 09/19/20 0937  BP: (!) 141/74 (!) 146/74  Pulse: 70 63  Resp: 16 13  Temp: 36.6 C   SpO2: 96% 92%    Last Pain:  Vitals:   09/19/20 0922  TempSrc:   PainSc: 0-No pain                 Rolla Plate P

## 2020-09-19 NOTE — Anesthesia Preprocedure Evaluation (Signed)
Anesthesia Evaluation  Patient identified by MRN, date of birth, ID band Patient awake    Reviewed: Allergy & Precautions, H&P , NPO status , Patient's Chart, lab work & pertinent test results, reviewed documented beta blocker date and time   Airway Mallampati: II   Neck ROM: full    Dental  (+) Poor Dentition   Pulmonary pneumonia, COPD,    Pulmonary exam normal        Cardiovascular Exercise Tolerance: Good hypertension, On Medications + Peripheral Vascular Disease  Normal cardiovascular exam Rhythm:regular Rate:Normal     Neuro/Psych  Headaches, PSYCHIATRIC DISORDERS Depression TIA Neuromuscular disease    GI/Hepatic negative GI ROS, Neg liver ROS,   Endo/Other  negative endocrine ROS  Renal/GU negative Renal ROS  negative genitourinary   Musculoskeletal   Abdominal   Peds  Hematology  (+) Blood dyscrasia, anemia ,   Anesthesia Other Findings Past Medical History: No date: Anemia No date: Breast cancer ([place]) 06/2007: Bronchiectasis ([place]) No date: Cancer ([place]) No date: Colitis No date: COPD (chronic obstructive pulmonary disease) (HCC) No date: Depression No date: DVT (deep venous thrombosis) ([place])     Comment:  after her right knee surgery.  No date: Fibromyalgia No date: Fibromyalgia No date: Hypertension No date: Migraine headache with aura No date: Osteoarthritis No date: Osteoporosis No date: PAD (peripheral artery disease) (HCC) No date: Pneumonia 1952: Polio No date: Stenosis of carotid artery Past Surgical History: No date: ABDOMINAL HYSTERECTOMY No date: BACK SURGERY No date: BILATERAL TOTAL MASTECTOMY WITH AXILLARY LYMPH NODE  DISSECTION No date: BREAST SURGERY No date: CARPAL TUNNEL RELEASE     Comment:  bilateral  08/02/2015: COLONOSCOPY WITH PROPOFOL; N/A     Comment:  Procedure: COLONOSCOPY WITH PROPOFOL;  Surgeon: Manya Silvas, MD;  Location: [facility];   Service:               Endoscopy;  Laterality: N/A; No date: FINGER TENDON REPAIR No date: HERNIA REPAIR No date: REPLACEMENT TOTAL KNEE     Comment:  right knee  No date: SQUAMOUS CELL CARCINOMA EXCISION No date: TONSILLECTOMY No date: UMBILICAL HERNIA REPAIR No date: VAGINAL HYSTERECTOMY BMI    Body Mass Index: 22.68 kg/m     Reproductive/Obstetrics negative OB ROS                             Anesthesia Physical Anesthesia Plan  ASA: III  Anesthesia Plan: Spinal   Post-op Pain Management:    Induction:   PONV Risk Score and Plan:   Airway Management Planned:   Additional Equipment:   Intra-op Plan:   Post-operative Plan:   Informed Consent: I have reviewed the patients History and Physical, chart, labs and discussed the procedure including the risks, benefits and alternatives for the proposed anesthesia with the patient or authorized representative who has indicated his/her understanding and acceptance.     Dental Advisory Given  Plan Discussed with: CRNA  Anesthesia Plan Comments:         Anesthesia Quick Evaluation

## 2020-09-19 NOTE — H&P (Signed)
Chief Complaint  Patient presents with  . Follow-up  Discuss Lt TKA scheduled 10/15/20    History of the Present Illness: Lindsey [name] is a 84 y.o. female here today.   The patient presents for discussion of left total knee arthroplasty. Previous radiographs show severe osteoarthrosis of the left knee, and mild hip arthritis. The patient reports she would like to have a left total knee arthroplasty as soon as possible.  The patient states she had a double mastectomy and had a bad experience. She states she had C. difficile. She has a vena cava filter due to a prior blood clot.   I have reviewed past medical, surgical, social and family history, and allergies as documented in the EMR.  Past Medical History: Past Medical History:  Diagnosis Date  . Allergic state  Cat dander, molds, dust, metals, tape  . Anemia  . Arthritis 1985  spine  . Asthma without status asthmaticus, unspecified  . Breast cancer (CMS-HCC)  R DCIS B mastectomies and reconstruction  . Bronchiectasis (CMS-HCC) 06/2007  . Cancer (CMS-HCC)  . Cataract cortical, senile B. 2014  R-good result, L-poor result  . Central serous chorioretinopathy  . Chronic diarrhea, unspecified [id number]  Chronic microscopic lymphocytic colitis  . COPD (chronic obstructive pulmonary disease) (CMS-HCC) 2006  Bronchiectasis. Controlled..nebulizing & inhaled steroids  . Depression  . Encounter for blood transfusion 2012  After abdominal bleed secondary to injection into abdomin.  . Fibromyalgia 1985  . Food intolerance 1990  Iceberg lettuce, cucumber, melon, fried foods  . History of blood transfusion 2012  After abdominal bleed secondary to injection into abdomin.  Marland Kitchen Hypertension  . Migraine headache 1985-present  Rx: Maxalt  . Neuropathy 2008-present  B- fingers & toes, weak proprioception-L fingers  . Osteoarthritis  . Pneumonia 2004-2008  secondary to black mold exposure  . Polio 1952   Past Surgical History: Past  Surgical History:  Procedure Laterality Date  . CATARACT EXTRACTION 2014 (bilateral)  . COLONOSCOPY 09/17/2010, 04/09/2009, 09/13/2003  [place] (Sister)  . COLONOSCOPY 08/02/2015  Adenomatous Polyp, [facility] (Sister): [facility] 07/2020  . COLONOSCOPY Multiple  Routine. In 2009/Dx MLColitis  . EGD 08/25/2010  . FRACTURE SURGERY 2007  . HERNIA REPAIR  . HYSTERECTOMY  . JOINT REPLACEMENT 2012  R knee  . KNEE ARTHROSCOPY  . MASTECTOMY 1999 (bilateral)  DCIS  . SIGMOIDOSCOPY 1987  Re: serious bout w/ diarrhea. Undiscovered etiology.  Marland Kitchen SPINE SURGERY 2019  MILD Procedure ([place])  . TONSILLECTOMY  . UPPER GASTROINTESTINAL ENDOSCOPY 1987, 2009  Re: colitis/ diarrhea   Past Family History: Family History  Problem Relation Age of Onset  . Cancer Mother  Ovarian, Pancreas  . Asthma Mother  . Osteoarthritis Mother  . Osteoporosis (Thinning of bones) Mother  . Cancer Father  Skin, Bladder, Prostate  . Alcohol abuse Father  Successfull trtmt , age 40  . Prostate cancer Father  . Skin cancer Father  . Stroke Father  . Stroke Paternal Grandmother  . Breast cancer Maternal Aunt  . Liver disease Maternal Aunt  . Colon cancer Sister  Stage 4 1997, age 12, successfully [facility] at [facility]  . Cancer Sister  Colon, Leukemia  . Migraines Sister  . Depression Paternal Grandfather  . Diabetes type II Maternal Grandmother  . Diabetes Maternal Grandmother  . Stroke Maternal Grandmother  . Cancer Daughter  . Diabetes Maternal Grandfather  . Stroke Maternal Grandfather  . Diabetes type II Maternal Grandfather  . Alcohol abuse Son  12, age 59 , 2005  .  Diabetes type II Maternal Aunt  . Skin cancer Daughter  Died from Melanoma, 2011-01-14  . Cancer Daughter  died from Melanoma, age 8   Medications: Current Outpatient Medications Ordered in Epic  Medication Sig Dispense Refill  . albuterol 90 mcg/actuation inhaler Inhale 2 inhalations into the lungs every 4 (four) hours as needed for Wheezing 1  Inhaler 2  . ARIPiprazole (ABILIFY) 2 MG tablet Take 1 tablet (2 mg total) by mouth once daily for 180 days 90 tablet 1  . budesonide-formoteroL (SYMBICORT) 160-4.5 mcg/actuation inhaler Inhale 2 inhalations into the lungs 2 (two) times daily 1 Inhaler 12  . CREON 24,000-76,000 -120,000 unit DR capsule TAKE ONE CAPSULE THREE TIMES A DAY WITH MEALS . TAKE WITH FIRST BITES OF FOOD. 90 capsule 2  . cyclobenzaprine (FLEXERIL) 5 MG tablet TAKE ONE TABLET AT BEDTIME AS NEEDED FORMUSCLE SPASM 30 tablet 5  . doxazosin (CARDURA) 2 MG tablet TAKE ONE TABLET BY MOUTH TWICE DAILY 180 tablet 3  . ezetimibe (ZETIA) 10 mg tablet Take 1 tablet by mouth once daily  . galcanezumab-gnlm (EMGALITY PEN) 120 mg/mL PnIj Inject 120 mg subcutaneously every 28 (twenty-eight) days 1 mL 11  . hydrALAZINE (APRESOLINE) 25 MG tablet Take 1 tablet by mouth once daily  . hydroCHLOROthiazide (HYDRODIURIL) 25 MG tablet TAKE ONE TABLET BY MOUTH ONCE DAILY FOR 90 DAYS 30 tablet 2  . hydrOXYzine (ATARAX) 25 MG tablet TAKE 1/2 TABLET BY MOUTH DAILY AS NEEDED 10 tablet 1  . polyethylene glycol (MIRALAX) packet Take by mouth  . potassium chloride (KLOR-CON) 10 mEq ER tablet Take 1 tablet by mouth once daily  . rizatriptan (MAXALT-MLT) 5 MG disintegrating tablet as directed  . sodium chloride 3 % nebulizer solution NEBULIZE 5ML FIVE TIMES DAILY AS NEEDED FOR CHEST CONGESTION 750 mL 1  . telmisartan (MICARDIS) 80 MG tablet Take 80 mg by mouth once daily  . UNABLE TO FIND Take 2 capsules by mouth once daily Med Name: strontium boost 680 mg  . UNABLE TO FIND Take 2 capsules by mouth 2 (two) times daily Med Name: algaecal plus  . venlafaxine (EFFEXOR-XR) 75 MG XR capsule Take 1 capsule (75 mg total) by mouth once daily 30 capsule 5   No current Epic-ordered facility-administered medications on file.   Allergies: Allergies  Allergen Reactions  . Adhesive Itching and Swelling  Severe with tegaderm Paper tape ok Bandaids if for short  time  . Iodinated Contrast Media Anaphylaxis  . Levocetirizine Unknown  Passing out  . Lifitegrast Itching  dry Burning of eyes  . Ciprofloxacin Other (See Comments)  History of C diff - encouraged not to take.  . Codeine Unknown  Dizziness   Dizziness    . Cucumber Unknown  . Demeclocycline Other (See Comments)  Stomach upset   Stomach upset    . Flagyl [Metronidazole] Abdominal Pain  . Lactose Other (See Comments)  Ok with small amounts Abdominal pain  . Lettuce Other (See Comments)  Iceberg lettuce only causes abdominal pain  . Melon Unknown  . Other Abdominal Pain  Iceberg Lettuce Various vaccine hypersensive   . Silver Unknown  metal  . Sulfa (Sulfonamide Antibiotics) Unknown  . Tetracycline Unknown  . Tiotropium Bromide Other (See Comments)  Face itchy and blood shot eyes  Other reaction(s): Other (See Comments) Face itchy and blood shot eyes    Body mass index is 23.34 kg/m.  Review of Systems: A comprehensive 14 point ROS was performed, reviewed, and the pertinent orthopaedic findings  are documented in the HPI.  Vitals:  09/04/20 1018  BP: 134/78    General Physical Examination:   General/Constitutional: No apparent distress: well-nourished and well developed. Eyes: Pupils equal, round with synchronous movement. Lungs: Clear to auscultation HEENT: Normal Vascular: No edema, swelling or tenderness, except as noted in detailed exam. Cardiac: Heart rate and rhythm is regular. Integumentary: No impressive skin lesions present, except as noted in detailed exam. Neuro/Psych: Normal mood and affect, oriented to person, place and time.  Musculoskeletal Examination:  On exam of the left knee, the patient has a valgus deformity that is passively correctable. Large effusion. Significant patella crepitation.  Radiographs:  No new imaging studies were obtained or reviewed today.  Assessment: ICD-10-CM  1. Primary osteoarthritis of left knee  M17.12  2. Effusion of left knee M25.462   Plan:  The patient presents for discussion of left total knee arthroplasty. Previous radiographs show severe osteoarthrosis of the left knee, and mild hip arthritis. The patient reports she would like to have a left total knee arthroplasty as soon as possible. I encouraged her to take a bottle of probiotic prior to surgery due to her C. difficile. All of her questions were answered.  The patient states she had a double mastectomy and had a bad experience. She states she had C. The patient has clinical findings of severe osteoarthritis of the left knee and mild hip arthritis.  We discussed the patient's prior x-ray findings. I explained left total knee arthroplasty in detail. I explained the surgery and postoperative course in detail. The patient would like to proceed with left total knee arthroplasty. I encouraged her to take a bottle of probiotic prior to surgery due to her C. difficile. All of her questions were answered.  We will schedule the patient for left total knee arthroplasty in the near future.  Surgical Risks:  The nature of the condition and the proposed procedure has been reviewed in detail with the patient. Surgical versus non-surgical options and prognosis for recovery have been reviewed and the inherent risks and benefits of each have been discussed including the risks of infection, bleeding, injury to nerves/blood vessels/tendons, incomplete relief of symptoms, persisting pain and/or stiffness, loss of function, complex regional pain syndrome, failure of the procedure, as appropriate.  Teeth: All natural.  Attestation: I, Dawn Royse, am documenting for [facility], MD utilizing [place].    Reviewed  H+P. No changes noted.

## 2020-09-19 NOTE — Op Note (Signed)
09/19/2020  9:24 AM  PATIENT:  Lindsey [name]   MRN: [id number]  PRE-OPERATIVE DIAGNOSIS:  Primary localized osteoarthritis of left knee   POST-OPERATIVE DIAGNOSIS:  Same   PROCEDURE:  Procedure(s): Left TOTAL KNEE ARTHROPLASTY   SURGEON: Laurene Footman, MD   ASSISTANTS: Rachelle Hora, PA-C   ANESTHESIA:   spinal   EBL:   100   BLOOD ADMINISTERED:none   DRAINS: Incisional wound VAC    LOCAL MEDICATIONS USED:  MARCAINE    and OTHER Exparel   SPECIMEN:  No Specimen   DISPOSITION OF SPECIMEN:  N/A   COUNTS:  YES   TOURNIQUET:   Tourniquet time was then approximately 60 minutes at 300 mm Hg   IMPLANTS: Medacta  GMK sphere system with  3 left femur, 2 left tibia with short stem and  14 mm insert.  Size  2 patella, all components cemented.   DICTATION: Viviann Spare Dictation   patient was brought to the operating room and spinal anesthesia was obtained.  After prepping and draping the  left leg in sterile fashion, and after patient identification and timeout procedures were completed, tourniquet was raised  and midline skin incision was made followed by medial parapatellar arthrotomy with  mild medial compartment osteoarthritis, moderate patellofemoral arthritis and  severe lateral compartment arthritis, partial synovectomy was also carried out.   The ACL and PCL and fat pad were excised along with anterior horns of the meniscus. The proximal tibia cutting guide from  the [facility] system was applied and the proximal tibia cut carried out.  The distal femoral cut was carried out in a similar fashion     The  3 femoral cutting guide applied with anterior posterior and chamfer cuts made.  The posterior horns of the menisci were removed at this point.   Injection of the above medication was carried out after the femoral and tibial cuts were carried out.  The  #2 baseplate trial was placed pinned into position and proximal tibial preparation carried out with drilling hand reaming and the keel  punch followed by placement of the  3 femur and sizing the tibial insert size   14 millimeter gave the best fit with stability and full extension.  The distal femoral drill holes were made in the notch cut for the trochlear groove was then carried out with trials were then removed the patella was cut using the patellar cutting guide and it sized to a size  2 after drill holes have been made  The knee was irrigated with pulsatile lavage and the bony surfaces dried the tibial component was cemented into place first.  Excess cement was removed and the polyethylene insert placed with a torque screw placed with a torque screwdriver tightened.  The distal femoral component was placed and the knee was held in extension as the patellar button was clamped into place.  After the cement was set, excess cement was removed and the knee was again irrigated thoroughly thoroughly irrigated.  The tourniquet was let down and hemostasis checked with electrocautery. The arthrotomy was repaired with first of Ethibond distally to help tighten the medial capsule then a heavy Quill suture,  followed by 3-0 V lock subcuticular closure, skin staples followed by incisional wound VAC and Polar Care.Marland Kitchen   PLAN OF CARE: Admit to inpatient    PATIENT DISPOSITION:  PACU - hemodynamically stable.

## 2020-09-19 NOTE — Anesthesia Procedure Notes (Signed)
Spinal  Patient location during procedure: OR Start time: 09/19/2020 7:20 AM End time: 09/19/2020 7:23 AM Staffing Performed: resident/CRNA  Resident/CRNA: Nelda Marseille, CRNA Preanesthetic Checklist Completed: patient identified, IV checked, site marked, risks and benefits discussed, surgical consent, monitors and equipment checked, pre-op evaluation and timeout performed Spinal Block Patient position: sitting Prep: Betadine Patient monitoring: heart rate, continuous pulse ox, blood pressure and cardiac monitor Approach: midline Location: L3-4 Injection technique: single-shot Needle Needle type: [place] and Introducer  Needle gauge: 25 G Needle length: 9 cm Assessment Sensory level: T10 Additional Notes Negative paresthesia. Negative blood return. Positive free-flowing CSF. Expiration date of kit checked and confirmed. Patient tolerated procedure well, without complications.

## 2020-09-19 NOTE — Transfer of Care (Signed)
Immediate Anesthesia Transfer of Care Note  Patient: Lindsey [name]  Procedure(s) Performed: TOTAL KNEE ARTHROPLASTY (Left Knee)  Patient Location: PACU  Anesthesia Type:Spinal  Level of Consciousness: awake, alert  and oriented  Airway & Oxygen Therapy: Patient Spontanous Breathing  Post-op Assessment: Report given to RN and Post -op Vital signs reviewed and stable  Post vital signs: Reviewed and stable  Last Vitals:  Vitals Value Taken Time  BP    Temp    Pulse    Resp    SpO2      Last Pain:  Vitals:   09/19/20 0622  TempSrc: Oral  PainSc: 3          Complications: No complications documented.

## 2020-09-20 ENCOUNTER — Encounter: Payer: Self-pay | Admitting: Orthopedic Surgery

## 2020-09-20 LAB — CBC
HCT: 32.1 % — ABNORMAL LOW (ref 36.0–46.0)
Hemoglobin: 10.6 g/dL — ABNORMAL LOW (ref 12.0–15.0)
MCH: 29.9 pg (ref 26.0–34.0)
MCHC: 33 g/dL (ref 30.0–36.0)
MCV: 90.7 fL (ref 80.0–100.0)
Platelets: 168 10*3/uL (ref 150–400)
RBC: 3.54 MIL/uL — ABNORMAL LOW (ref 3.87–5.11)
RDW: 13.3 % (ref 11.5–15.5)
WBC: 6.2 10*3/uL (ref 4.0–10.5)
nRBC: 0 % (ref 0.0–0.2)

## 2020-09-20 LAB — BASIC METABOLIC PANEL
Anion gap: 8 (ref 5–15)
BUN: 18 mg/dL (ref 8–23)
CO2: 28 mmol/L (ref 22–32)
Calcium: 8.2 mg/dL — ABNORMAL LOW (ref 8.9–10.3)
Chloride: 99 mmol/L (ref 98–111)
Creatinine, Ser: 0.47 mg/dL (ref 0.44–1.00)
GFR, Estimated: >60 mL/min (ref >60)
Glucose, Bld: 111 mg/dL — ABNORMAL HIGH (ref 70–99)
Potassium: 3.3 mmol/L — ABNORMAL LOW (ref 3.5–5.1)
Sodium: 135 mmol/L (ref 135–145)

## 2020-09-20 MED ORDER — TRAMADOL HCL 50 MG PO TABS: 50.0000 mg | ORAL_TABLET | Freq: Four times a day (QID) | ORAL | 0 refills | Status: DC | PRN

## 2020-09-20 MED ORDER — POTASSIUM CHLORIDE 20 MEQ PO PACK
20.0000 meq | PACK | Freq: Three times a day (TID) | ORAL | Status: AC
  Administered 2020-09-20 (×3): 20 meq via ORAL
  Filled 2020-09-20 (×3): qty 1

## 2020-09-20 MED ORDER — ENOXAPARIN SODIUM 40 MG/0.4ML ~~LOC~~ SOLN: 40.0000 mg | SUBCUTANEOUS | 0 refills | Status: DC

## 2020-09-20 MED ORDER — METHOCARBAMOL 500 MG PO TABS: 500.0000 mg | ORAL_TABLET | Freq: Four times a day (QID) | ORAL | 0 refills | Status: DC | PRN

## 2020-09-20 MED ORDER — HYDROCODONE-ACETAMINOPHEN 5-325 MG PO TABS: 1.0000 | ORAL_TABLET | ORAL | 0 refills | Status: DC | PRN

## 2020-09-20 NOTE — TOC Initial Note (Signed)
Transition of Care [facility]) - Initial/Assessment Note    Patient Details  Name: Lindsey [name] MRN: [id number] Date of Birth: 1936/06/03  [facility]) CM/SW Contact:    Shelbie Ammons, RN Phone Number: 09/20/2020, 1:35 PM  Clinical Narrative:   RNCM met with patient in room, patient sitting up in recliner, reports to feeling ok today but did not do as well with therapy as she though she would. Patient reports that it is her plan to go to skilled when she leaves the hospital as she already lives on the grounds. Verified with therapy that new recommendation will be for skilled.  RNCM completed PASSR, FL2 and sent patient through hub to [place] as well as calling and notifying Joelene Millin.  RNCM reached out to Tammy with [employer] and insurance authorization was started.       Expected Discharge Plan: Skilled Nursing Facility Barriers to Discharge: No Barriers Identified   Patient Goals and CMS Choice        Expected Discharge Plan and Services Expected Discharge Plan: [place] Acute Care Choice: [place] Living arrangements for the past 2 months: [place]                                      Prior Living Arrangements/Services Living arrangements for the past 2 months: [place] Lives with:: Self Patient language and need for interpreter reviewed:: Yes Do you feel safe going back to the place where you live?: Yes      Need for Family Participation in Patient Care: Yes (Comment) Care giver support system in place?: Yes (comment)   Criminal Activity/Legal Involvement Pertinent to Current Situation/Hospitalization: No - Comment as needed  Activities of Daily Living Home Assistive Devices/Equipment: Eyeglasses,Hearing aid ADL Screening (condition at time of admission) Patient's cognitive ability adequate to safely complete daily activities?: Yes Is the patient deaf or have difficulty  hearing?: Yes Does the patient have difficulty seeing, even when wearing glasses/contacts?: Yes Does the patient have difficulty concentrating, remembering, or making decisions?: No Patient able to express need for assistance with ADLs?: Yes Does the patient have difficulty dressing or bathing?: No Independently performs ADLs?: Yes (appropriate for developmental age) Does the patient have difficulty walking or climbing stairs?: Yes Weakness of Legs: Left Weakness of Arms/Hands: Both  Permission Sought/Granted                  Emotional Assessment Appearance:: Appears stated age Attitude/Demeanor/Rapport: Engaged Affect (typically observed): Appropriate,Calm Orientation: : Oriented to Self,Oriented to Place,Oriented to  Time,Oriented to Situation Alcohol / Substance Use: Not Applicable Psych Involvement: No (comment)  Admission diagnosis:  S/P TKR (total knee replacement) using cement, bilateral [Y04.599] Patient Active Problem List   Diagnosis Date Noted  . S/P TKR (total knee replacement) using cement, bilateral 09/19/2020  . TIA (transient ischemic attack) 01/03/2020  . Migraine headache with aura 01/03/2020  . Essential hypertension 01/03/2020  . Depression 01/03/2020  . COPD with chronic bronchitis ([place]) 01/03/2020  . Hypokalemia 01/03/2020  . SOB (shortness of breath) 04/30/2014  . Leg swelling 04/30/2014  . Bronchiectasis without complication ([place]) 77/41/4239  . History of DVT (deep vein thrombosis) 04/30/2014  . Labile blood pressure 04/30/2014  . S/P IVC filter 04/30/2014  . History of right knee surgery 04/30/2014   PCP:  Tracie Harrier, MD Pharmacy:   [facility]  [place], [place] [address] Phone: [phone] Fax: [phone]     Social Determinants of Health (SDOH) Interventions    Readmission Risk Interventions No flowsheet data found.

## 2020-09-20 NOTE — Progress Notes (Signed)
Physical Therapy Treatment Patient Details Name: Lindsey [name] MRN: [id number] DOB: Aug 20, 1936 Today's Date: 09/20/2020    History of Present Illness Pt is an 84 yo female diagnosed with OA of the L knee and is s/p elective L TKA.  PMH includes: HTN, PVD, COPD, PNA, breast CA, fibromyalgia, osteopenia, R TKA, and back surgery.    PT Comments    Pt found lethargic but responded to verbal stimulation and willing to participate with PT services.  Pt declined OOB activities, however, secondary to lethargy.  Pt participate in below therex and put in good effort when awake but required frequent verbal stimulation to prevent drifting off to sleep.  Pt stated that she felt like it was related to medication, nursing notified.  Pt will benefit from PT services in a SNF setting upon discharge to safely address deficits listed in patient problem list for decreased caregiver assistance and eventual return to PLOF.   Follow Up Recommendations  Supervision for mobility/OOB;SNF     Equipment Recommendations  Rolling walker with 5" wheels    Recommendations for Other Services       Precautions / Restrictions Precautions Precautions: Fall Precaution Comments: Knee Restrictions Weight Bearing Restrictions: Yes LLE Weight Bearing: Weight bearing as tolerated    Mobility  Bed Mobility Overal bed mobility: Needs Assistance Bed Mobility: Rolling Rolling: Min assist   Supine to sit: Min assist     General bed mobility comments: Rolling left/right with min A and cues for sequencing  Transfers Overall transfer level: Needs assistance Equipment used: Rolling walker (2 wheeled) Transfers: Sit to/from Stand Sit to Stand: Min assist         General transfer comment: Pt declined OOB activity secondary to lethargy  Ambulation/Gait Ambulation/Gait assistance: Min guard Gait Distance (Feet): 3 Feet Assistive device: Rolling walker (2 wheeled) Gait Pattern/deviations: Step-to  pattern;Antalgic;Shuffle Gait velocity: decreased   General Gait Details: Pt able to lift and advance her LLE but unable to fully clear the floor with her RLE advancing it with a shuffle step pattern; max amb distance from bed to chair only with pt anxious and reported several times that she felt like she was going to fall   Stairs             Wheelchair Mobility    Modified Rankin (Stroke Patients Only)       Balance Overall balance assessment: Needs assistance   Sitting balance-Leahy Scale: Normal     Standing balance support: Bilateral upper extremity supported;During functional activity Standing balance-Leahy Scale: Fair Standing balance comment: heavy lean on the RW for support in standing                            Cognition Arousal/Alertness: Lethargic;Suspect due to medications Behavior During Therapy: [facility] for tasks assessed/performed Overall Cognitive Status: Within Functional Limits for tasks assessed                                        Exercises Total Joint Exercises Ankle Circles/Pumps: AROM;Strengthening;Both;10 reps;15 reps Quad Sets: Strengthening;10 reps;15 reps;Both Gluteal Sets: Strengthening;Both;10 reps;15 reps Heel Slides: AAROM;Left;5 reps;10 reps Hip ABduction/ADduction: AROM;AAROM;Both;10 reps;15 reps (AAROM on the LLE) Straight Leg Raises: AAROM;AROM;Both;10 reps;15 reps (AAROM on the LLE) Long Arc Quad: AROM;Left;10 reps;15 reps Knee Flexion: AROM;Left;10 reps;15 reps Goniometric ROM: L knee AROM: 6-73 deg Marching in Standing: AROM;Left;5  reps;Standing Other Exercises Other Exercises: Pt education provided on physiological benefits of activity/OOB Other Exercises: HEP education/review per handout    General Comments        Pertinent Vitals/Pain Pain Assessment: 0-10 Pain Score: 4  Pain Location: L knee Pain Descriptors / Indicators: Sore;Discomfort;Grimacing;Guarding Pain Intervention(s):  Premedicated before session;Monitored during session;Repositioned    Home Living Family/patient expects to be discharged to:: Private residence ([place] Living Arrangements: Alone Available Help at Discharge: Friend(s);Available PRN/intermittently Type of Home: House Home Access: Level entry   Home Layout: One level Home Equipment: Walker - 4 wheels;Cane - single point;Grab bars - tub/shower;Grab bars - toilet Additional Comments: Elevated toilet with arm rests    Prior Function Level of Independence: Independent      Comments: Pt reports she is generally independent with amb community distances without an AD, no falls history in last 6 months, rare use of a rollator "usually when I'm going to the airport", Ind with ADLs, drives   PT Goals (current goals can now be found in the care plan section) Acute Rehab PT Goals Patient Stated Goal: To walk better without pain Progress towards PT goals: Not progressing toward goals - comment (pt limited by lethargy)    Frequency    BID      PT Plan Current plan remains appropriate    Co-evaluation              AM-PAC PT "6 Clicks" Mobility   Outcome Measure  Help needed turning from your back to your side while in a flat bed without using bedrails?: A Little Help needed moving from lying on your back to sitting on the side of a flat bed without using bedrails?: A Little Help needed moving to and from a bed to a chair (including a wheelchair)?: A Little Help needed standing up from a chair using your arms (e.g., wheelchair or bedside chair)?: A Little Help needed to walk in hospital room?: Total Help needed climbing 3-5 steps with a railing? : Total 6 Click Score: 14    End of Session Equipment Utilized During Treatment: Gait belt Activity Tolerance: Patient limited by lethargy Patient left: in bed;with call bell/phone within reach;with bed alarm set;with SCD's reapplied;Other (comment) (Polar care  donned to L knee) Nurse Communication: Mobility status;Weight bearing status PT Visit Diagnosis: Other abnormalities of gait and mobility (R26.89);Muscle weakness (generalized) (M62.81);Pain Pain - Right/Left: Left Pain - part of body: Knee     Time: 1412-1440 PT Time Calculation (min) (ACUTE ONLY): 28 min  Charges:  $Gait Training: 8-22 mins $Therapeutic Exercise: 23-37 mins $Therapeutic Activity: 8-22 mins                     D. Scott Kaheem Halleck PT, DPT 09/20/20, 5:25 PM

## 2020-09-20 NOTE — Discharge Summary (Addendum)
Physician Discharge Summary  Patient ID: Lindsey [name] MRN: [id number] DOB/AGE: 11/13/1935 84 y.o.  Admit date: 09/19/2020 Discharge date: 09/23/2020 Admission Diagnoses:  S/P TKR (total knee replacement) using cement, bilateral [Z96.653]   Discharge Diagnoses: Patient Active Problem List   Diagnosis Date Noted  . S/P TKR (total knee replacement) using cement, bilateral 09/19/2020  . TIA (transient ischemic attack) 01/03/2020  . Migraine headache with aura 01/03/2020  . Essential hypertension 01/03/2020  . Depression 01/03/2020  . COPD with chronic bronchitis ([place]) 01/03/2020  . Hypokalemia 01/03/2020  . SOB (shortness of breath) 04/30/2014  . Leg swelling 04/30/2014  . Bronchiectasis without complication ([place]) 01/60/1093  . History of DVT (deep vein thrombosis) 04/30/2014  . Labile blood pressure 04/30/2014  . S/P IVC filter 04/30/2014  . History of right knee surgery 04/30/2014    Past Medical History:  Diagnosis Date  . Anemia   . Breast cancer ([place])   . Bronchiectasis ([place]) 06/2007  . Cancer ([place])   . Colitis   . COPD (chronic obstructive pulmonary disease) ([place])   . Depression   . DVT (deep venous thrombosis) ([place])    after her right knee surgery.   . Fibromyalgia   . Fibromyalgia   . Hypertension   . Migraine headache with aura   . Osteoarthritis   . Osteoporosis   . PAD (peripheral artery disease) ([place])   . Pneumonia   . Polio 1952  . Stenosis of carotid artery      Transfusion: None   Consultants (if any):   Discharged Condition: Improved  Hospital Course: Lindsey [name] is an 84 y.o. female who was admitted 09/19/2020 with a diagnosis of left knee osteoarthritis and went to the operating room on 09/19/2020 and underwent the above named procedures.    Surgeries: Procedure(s): TOTAL KNEE ARTHROPLASTY on 09/19/2020 Patient tolerated the surgery well. Taken to PACU where she was stabilized and then transferred to the orthopedic floor.  Started  on Lovenox 30 mg q 12 hrs. Foot pumps applied bilaterally at 80 mm. Heels elevated on bed with rolled towels. No evidence of DVT. Negative [place]. Physical therapy started on day #1 for gait training and transfer. OT started day #1 for ADL and assisted devices.  Patient's foley was d/c on day #1.  The patient ambulated several feet with physical therapy.  The patient was having swelling in the back of the left leg and had an ultrasound of the lower extremity.  The ultrasound was negative.  The patient did slowly improve with ambulation and was ready to go home to [place].  Her vitals remained stable.  Her potassium level was 3.3 and holding.  She was given potassium supplements.  On 09/23/2020 patient was stable and ready for discharge to skilled nursing facility.  Implants: Medacta GMK sphere system with 3 leftfemur, 2 lefttibia with short stem and 74mm insert. Size 2patella, all components cemented.  She was given perioperative antibiotics:  Anti-infectives (From admission, onward)   Start     Dose/Rate Route Frequency Ordered Stop   09/19/20 1415  ceFAZolin (ANCEF) IVPB 1 g/50 mL premix        1 g 100 mL/hr over 30 Minutes Intravenous Every 6 hours 09/19/20 1407 09/19/20 2225   09/19/20 0617  ceFAZolin (ANCEF) 1-4 GM/50ML-% IVPB       Note to Pharmacy: Marilynn Latino, Candace   : cabinet override      09/19/20 0617 09/19/20 0923   09/19/20 0600  ceFAZolin (ANCEF) IVPB  1 g/50 mL premix        1 g 100 mL/hr over 30 Minutes Intravenous On call to O.R. 09/19/20 0107 09/19/20 0730    .  She was given sequential compression devices, early ambulation, and Lovenox, teds for DVT prophylaxis.  She benefited maximally from the hospital stay and there were no complications.    Recent vital signs:  Vitals:   09/22/20 0021 09/22/20 0443  BP: 134/69 (!) 153/68  Pulse: 87 72  Resp: 16 16  Temp: 98.4 F (36.9 C) 98.6 F (37 C)  SpO2: 99% 95%    Recent laboratory studies:  Lab  Results  Component Value Date   HGB 10.4 (L) 09/21/2020   HGB 10.6 (L) 09/20/2020   HGB 10.5 (L) 09/19/2020   Lab Results  Component Value Date   WBC 4.8 09/21/2020   PLT 171 09/21/2020   No results found for: INR Lab Results  Component Value Date   NA 134 (L) 09/21/2020   K 3.3 (L) 09/21/2020   CL 98 09/21/2020   CO2 28 09/21/2020   BUN 11 09/21/2020   CREATININE 0.49 09/21/2020   GLUCOSE 81 09/21/2020    Discharge Medications:   Allergies as of 09/22/2020      Reactions   Ivp Dye [iodinated Diagnostic Agents] Anaphylaxis   Codeine    Dizziness    Sulfa Antibiotics Hives   Tegaderm Ag Mesh [silver]    Tetracyclines & Related    Stomach upset       Medication List    TAKE these medications   albuterol 108 (90 Base) MCG/ACT inhaler Commonly known as: VENTOLIN HFA Inhale 2 puffs into the lungs every 6 (six) hours as needed for wheezing or shortness of breath.   ARIPiprazole 2 MG tablet Commonly known as: ABILIFY Take 2 mg by mouth daily.   budesonide-formoterol 160-4.5 MCG/ACT inhaler Commonly known as: SYMBICORT Inhale 2 puffs into the lungs in the morning and at bedtime.   doxazosin 2 MG tablet Commonly known as: CARDURA Take 1 tablet (2 mg total) by mouth 2 (two) times daily.   Emgality 120 MG/ML Soaj Generic drug: Galcanezumab-gnlm Inject 120 mg into the skin every 30 (thirty) days.   enoxaparin 40 MG/0.4ML injection Commonly known as: Lovenox Inject 0.4 mLs (40 mg total) into the skin daily for 14 days.   ezetimibe 10 MG tablet Commonly known as: ZETIA Take 1 tablet (10 mg total) by mouth daily.   Fish Oil 1000 MG Caps Take 2,000 mg by mouth daily.   hydrALAZINE 25 MG tablet Commonly known as: APRESOLINE Take 1 tablet (25 mg total) by mouth as needed (for BP >170).   hydrochlorothiazide 25 MG tablet Commonly known as: HYDRODIURIL Take 1 tablet (25 mg total) by mouth daily.   HYDROcodone-acetaminophen 5-325 MG tablet Commonly known as:  NORCO/VICODIN Take 1-2 tablets by mouth every 4 (four) hours as needed for moderate pain (pain score 4-6).   hydrOXYzine 25 MG tablet Commonly known as: ATARAX/VISTARIL Take 25 mg by mouth as needed for anxiety.   methocarbamol 500 MG tablet Commonly known as: ROBAXIN Take 1 tablet (500 mg total) by mouth every 6 (six) hours as needed for muscle spasms.   NON FORMULARY Take 2 capsules by mouth in the morning and at bedtime. Algaecal   NON FORMULARY Take 2 capsules by mouth daily. Strontium Boost   OVER THE COUNTER MEDICATION Take 1 tablet by mouth daily. Adren-ALL   polyethylene glycol 17 g packet Commonly known  as: MIRALAX / GLYCOLAX Take 17 g by mouth daily as needed for mild constipation or moderate constipation.   potassium chloride 10 MEQ tablet Commonly known as: Klor-Con M10 Take 1 tablet (10 mEq total) by mouth daily.   rizatriptan 5 MG tablet Commonly known as: MAXALT Take 5 mg by mouth as needed for migraine.   telmisartan 80 MG tablet Commonly known as: MICARDIS Take 1 tablet (80 mg total) by mouth daily.   traMADol 50 MG tablet Commonly known as: ULTRAM Take 1 tablet (50 mg total) by mouth every 6 (six) hours as needed.   venlafaxine XR 75 MG 24 hr capsule Commonly known as: EFFEXOR-XR Take 75 mg by mouth daily with breakfast.            Durable Medical Equipment  (From admission, onward)         Start     Ordered   09/19/20 1515  DME Walker rolling  Once       Question Answer Comment  Walker: With [place] Wheels   Patient needs a walker to treat with the following condition S/P TKR (total knee replacement) using cement, left      09/19/20 1514   09/19/20 1515  DME 3 n 1  Once        09/19/20 1514   09/19/20 1515  DME Bedside commode  Once       Question:  Patient needs a bedside commode to treat with the following condition  Answer:  S/P TKR (total knee replacement) using cement, left   09/19/20 1514          Diagnostic Studies: DG  Knee 1-2 Views Left  Result Date: 09/19/2020 CLINICAL DATA:  84 year old female status post total knee arthroplasty. EXAM: LEFT KNEE - 1-2 VIEW COMPARISON:  02/13/2016. FINDINGS: Portable AP and cross-table lateral views at 0944 hours. Left total knee arthroplasty hardware appears intact and normally aligned. No unexpected osseous changes. Small volume gas and fluid in the joint. Overlying skin staples and wound VAC. Calcified peripheral vascular disease. IMPRESSION: Left total knee arthroplasty with no adverse features. Electronically Signed   By: Genevie Ann M.D.   On: 09/19/2020 10:23   US Venous Img Lower Unilateral Left (DVT)  Result Date: 09/21/2020 CLINICAL DATA:  Pain EXAM: LEFT LOWER EXTREMITY VENOUS DOPPLER ULTRASOUND TECHNIQUE: Gray-scale sonography with compression, as well as color and duplex ultrasound, were performed to evaluate the deep venous system(s) from the level of the common femoral vein through the popliteal and proximal calf veins. COMPARISON:  None. FINDINGS: VENOUS Normal compressibility of the common femoral, superficial femoral, and popliteal veins, as well as the visualized calf veins. Visualized portions of profunda femoral vein and great saphenous vein unremarkable. No filling defects to suggest DVT on grayscale or color Doppler imaging. Doppler waveforms show normal direction of venous flow, normal respiratory plasticity and response to augmentation. Limited views of the contralateral common femoral vein are unremarkable. OTHER In the left popliteal fossa there is a 6 x 1.5 x 4.3 cm complex cystic structure. Limitations: none IMPRESSION: 1. No DVT 2. There is a left-sided popliteal cyst. Electronically Signed   By: Constance Holster M.D.   On: 09/21/2020 17:17    Disposition: Discharge disposition: 01-Home or Self Care          Follow-up Information    Duanne Guess, PA-C Follow up in 2 week(s).   Specialties: Orthopedic Surgery, Emergency Medicine Contact  information: [address] [place] [address]  [phone]                SignedPrescott Parma, TODD 09/22/2020, 6:57 AM

## 2020-09-20 NOTE — Discharge Instructions (Signed)

## 2020-09-20 NOTE — Plan of Care (Signed)

## 2020-09-20 NOTE — Progress Notes (Signed)
   09/20/20 1412  Clinical Encounter Type  Visited With Patient;Health care provider  Visit Type Follow-up  Referral From Chaplain  Consult/Referral To Chaplain  Chaplain stopped by to check on Pt, but physical therapist was with her. Chaplain introduced herself and told Pt she met her daughter-in-law yesterday. Pt appeared to be in pain. Chaplain wished her well and left so not to interrupt with physical therapy.

## 2020-09-20 NOTE — Evaluation (Signed)
Occupational Therapy Evaluation Patient Details Name: Lindsey [name] MRN: [id number] DOB: [birth year] Today's Date: 09/20/2020    History of Present Illness Pt is an 84 yo female diagnosed with OA of the L knee and is s/p elective L TKA.  PMH includes: HTN, PVD, COPD, PNA, breast CA, fibromyalgia, osteopenia, R TKA, and back surgery.   Clinical Impression   Ms. "Lindsey" [name] was seen for OT evaluation this date, POD#1 from above surgery. Pt was independent in all ADLs prior to surgery, however occasionally using a 4WW or SPC for extended community distances due to knee pain. Pt is eager to return to PLOF with less pain and improved safety and independence. Pt currently requires MOD assist for LB dressing while in seated position due to pain and limited AROM of L knee. Pt instructed in polar care mgt, falls prevention strategies, home/routines modifications, & complementary alternative methods for pain management including gentle self-massage and distraction techniques. Pt would benefit from skilled OT services including additional instruction in dressing techniques with or without assistive devices for dressing and bathing skills to support recall and carryover prior to discharge and ultimately to maximize safety, independence, and minimize falls risk and caregiver burden. Recommend STR upon hospital DC to maximize pt safety and return to PLOF.     Follow Up Recommendations  SNF;Supervision - Intermittent    Equipment Recommendations  3 in 1 bedside commode    Recommendations for Other Services       Precautions / Restrictions Precautions Precautions: Fall Precaution Comments: Knee Restrictions Weight Bearing Restrictions: Yes LLE Weight Bearing: Weight bearing as tolerated      Mobility Bed Mobility Overal bed mobility: Needs Assistance Bed Mobility: Supine to Sit     Supine to sit: Min assist     General bed mobility comments: Deferred. Pt declines any OOB/EOB activity  despite education on importance of early mobility after surgery.    Transfers Overall transfer level: Needs assistance Equipment used: Rolling walker (2 wheeled) Transfers: Sit to/from Stand Sit to Stand: Min assist         General transfer comment: Deferred. Pt declines any OOB/EOB activity despite education on importance of early mobility after surgery.    Balance Overall balance assessment: Needs assistance   Sitting balance-Leahy Scale: Normal     Standing balance support: Bilateral upper extremity supported;During functional activity Standing balance-Leahy Scale: Fair Standing balance comment: heavy lean on the RW for support in standing                           ADL either performed or assessed with clinical judgement   ADL Overall ADL's : Needs assistance/impaired                                       General ADL Comments: Pt functionally limited by significant pain and decreased AROM of her LLE. She declines functional OOB/EOB activity this date. Anticipate MOD A for LB ADL management including LB bathing and dressing. MAX A to don compression stockings.     Vision Baseline Vision/History: Wears glasses Patient Visual Report: No change from baseline       Perception     Praxis      Pertinent Vitals/Pain Pain Assessment: 0-10 Pain Score: 10-Worst pain ever Pain Location: L knee Pain Descriptors / Indicators: Sore;Discomfort;Grimacing;Guarding Pain Intervention(s): Limited activity within patient's tolerance;Monitored during  session;Utilized relaxation techniques;Ice applied;Patient requesting pain meds-RN notified;RN gave pain meds during session     Hand Dominance Right   Extremity/Trunk Assessment Upper Extremity Assessment Upper Extremity Assessment: Overall WFL for tasks assessed   Lower Extremity Assessment Lower Extremity Assessment: Generalized weakness;LLE deficits/detail;Defer to PT evaluation LLE Deficits /  Details: s/p L TKA LLE: Unable to fully assess due to pain       Communication Communication Communication: No difficulties   Cognition Arousal/Alertness: Awake/alert;Lethargic;Suspect due to medications (Pt becomes lethargic toward end of session) Behavior During Therapy: [facility] for tasks assessed/performed;Anxious Overall Cognitive Status: Within Functional Limits for tasks assessed                                     General Comments       Exercises Total Joint Exercises Ankle Circles/Pumps: AROM;Strengthening;Both;10 reps;5 reps Quad Sets: AROM;Strengthening;Left;5 reps;10 reps Gluteal Sets: Strengthening;Both;10 reps Hip ABduction/ADduction: AROM;AAROM;Both;5 reps;10 reps Straight Leg Raises: AAROM;AROM;Both;5 reps;10 reps Long Arc Quad: AROM;Left;10 reps;15 reps Knee Flexion: AROM;Left;10 reps;15 reps Goniometric ROM: L knee AROM: 6-73 deg Marching in Standing: AROM;Left;5 reps;Standing Other Exercises Other Exercises: Pt educated on falls prevention strategies, importance of early mobility after surgery, polar care management, complementary alternative methods for pain management including gentle self-massage and distraction techniques, and routines modifications to support safety and fxl independence during meaningful occupations of daily life upon hospital DC. Other Exercises: HEP education/review per handout   Shoulder Instructions      Home Living Family/patient expects to be discharged to:: Private residence ([place] in [place]: Alone Available Help at Discharge: Friend(s);Available PRN/intermittently Type of Home: House Home Access: Level entry     Home Layout: One level     Bathroom Shower/Tub: Teacher, early years/pre: Handicapped height     Home Equipment: Environmental consultant - 4 wheels;Cane - single point;Grab bars - tub/shower;Grab bars - toilet   Additional Comments: Elevated toilet with arm rests       Prior Functioning/Environment Level of Independence: Independent        Comments: Pt reports she is generally independent with amb community distances without an AD, no falls history in last 6 months, rare use of a rollator "usually when I'm going to the airport", Ind with ADLs, drives        OT Problem List: Decreased strength;Pain;Decreased coordination;Decreased range of motion;Decreased safety awareness;Decreased knowledge of use of DME or AE;Decreased knowledge of precautions      OT Treatment/Interventions: Self-care/ADL training;Therapeutic exercise;Therapeutic activities;DME and/or AE instruction;Patient/family education;Balance training;Energy conservation    OT Goals(Current goals can be found in the care plan section) Acute Rehab OT Goals Patient Stated Goal: To walk better without pain OT Goal Formulation: With patient Time For Goal Achievement: 10/04/20 Potential to Achieve Goals: Good ADL Goals Pt Will Perform Lower Body Dressing: sit to/from stand;with supervision;with set-up;with adaptive equipment (c LRAD PRN for improved safety and functional independence upon hospital DC.) Pt Will Transfer to Toilet: bedside commode;with set-up;with supervision;ambulating (c LRAD PRN for improved safety and functional independence upon hospital DC.) Pt Will Perform Toileting - Clothing Manipulation and hygiene: sit to/from stand;with adaptive equipment;with supervision;with set-up (c LRAD PRN for improved safety and functional independence upon hospital DC.)  OT Frequency: Min 1X/week   Barriers to D/C: Decreased caregiver support          Co-evaluation  AM-PAC OT "6 Clicks" Daily Activity     Outcome Measure Help from another person eating meals?: A Little Help from another person taking care of personal grooming?: A Little Help from another person toileting, which includes using toliet, bedpan, or urinal?: A Little Help from another person bathing  (including washing, rinsing, drying)?: A Lot Help from another person to put on and taking off regular upper body clothing?: A Little Help from another person to put on and taking off regular lower body clothing?: A Lot 6 Click Score: 16   End of Session Nurse Communication: Patient requests pain meds  Activity Tolerance: Patient limited by pain Patient left: in bed;with call bell/phone within reach (on bedpan, NT aware pt will need assistance with clean-up after.)  OT Visit Diagnosis: Other abnormalities of gait and mobility (R26.89);Pain Pain - Right/Left: Left Pain - part of body: Knee;Leg                Time: 1610-9604 OT Time Calculation (min): 34 min Charges:  OT General Charges $OT Visit: 1 Visit OT Evaluation $OT Eval Moderate Complexity: 1 Mod OT Treatments $Self Care/Home Management : 23-37 mins  Shara Blazing, M.S., OTR/L Ascom: [phone] 09/20/20, 2:13 PM

## 2020-09-20 NOTE — NC FL2 (Signed)
[place] LEVEL OF CARE SCREENING TOOL     IDENTIFICATION  Patient Name: Lindsey [name]: 06-24-1936 Sex: female Admission Date (Current Location): 09/19/2020  [place] and [place] Number:  Engineering geologist and Address:  [facility], [address], [place], [place] [address]      Provider Number: [id number]  Attending Physician Name and Address:  Hessie Knows, MD  Relative Name and Phone Number:  Phil Corti [phone]    Current Level of Care: Hospital Recommended Level of Care: [place] Prior Approval Number:    Date Approved/Denied:   PASRR Number: [id number]  Discharge Plan: SNF    Current Diagnoses: Patient Active Problem List   Diagnosis Date Noted  . S/P TKR (total knee replacement) using cement, bilateral 09/19/2020  . TIA (transient ischemic attack) 01/03/2020  . Migraine headache with aura 01/03/2020  . Essential hypertension 01/03/2020  . Depression 01/03/2020  . COPD with chronic bronchitis ([place]) 01/03/2020  . Hypokalemia 01/03/2020  . SOB (shortness of breath) 04/30/2014  . Leg swelling 04/30/2014  . Bronchiectasis without complication ([place]) 60/63/0160  . History of DVT (deep vein thrombosis) 04/30/2014  . Labile blood pressure 04/30/2014  . S/P IVC filter 04/30/2014  . History of right knee surgery 04/30/2014    Orientation RESPIRATION BLADDER Height & Weight     Self,Time,Situation,Place  Normal External catheter Weight: 56.2 kg Height:  5\' 2"  (157.5 cm)  BEHAVIORAL SYMPTOMS/MOOD NEUROLOGICAL BOWEL NUTRITION STATUS      Continent Diet (Regular)  AMBULATORY STATUS COMMUNICATION OF NEEDS Skin   Extensive Assist Verbally Surgical wounds                       Personal Care Assistance Level of Assistance  Dressing,Feeding,Bathing Bathing Assistance: Limited assistance Feeding assistance: Independent Dressing Assistance: Limited assistance     Functional  Limitations Info             SPECIAL CARE FACTORS FREQUENCY  PT (By licensed PT),OT (By licensed OT)                    Contractures Contractures Info: Not present    Additional Factors Info  Code Status,Allergies Code Status Info: Full Allergies Info: IVP Dye, Codeine, Sulfa, Tegaderm, Tetracyclines           Current Medications (09/20/2020):  This is the current hospital active medication list Current Facility-Administered Medications  Medication Dose Route Frequency Provider Last Rate Last Admin  . 0.9 %  sodium chloride infusion   Intravenous Continuous Hessie Knows, MD 75 mL/hr at 09/19/20 1532 New Bag at 09/19/20 1532  . acetaminophen (TYLENOL) tablet 325-650 mg  325-650 mg Oral Q6H PRN Hessie Knows, MD      . albuterol (PROVENTIL) (2.5 MG/3ML) 0.083% nebulizer solution 2.5 mg  2.5 mg Inhalation Q6H PRN Hessie Knows, MD      . alum & mag hydroxide-simeth (MAALOX/MYLANTA) 200-200-20 MG/5ML suspension 30 mL  30 mL Oral Q4H PRN Hessie Knows, MD      . ARIPiprazole (ABILIFY) tablet 2 mg  2 mg Oral Daily Hessie Knows, MD   2 mg at 09/20/20 0957  . bisacodyl (DULCOLAX) suppository 10 mg  10 mg Rectal Daily PRN Hessie Knows, MD      . diphenhydrAMINE (BENADRYL) 12.5 MG/5ML elixir 12.5-25 mg  12.5-25 mg Oral Q4H PRN Hessie Knows, MD      . docusate sodium (COLACE) capsule 100 mg  100 mg Oral  BID Hessie Knows, MD   100 mg at 09/20/20 0957  . doxazosin (CARDURA) tablet 2 mg  2 mg Oral BID Hessie Knows, MD   2 mg at 09/20/20 0958  . enoxaparin (LOVENOX) injection 30 mg  30 mg Subcutaneous Q12H Hessie Knows, MD   30 mg at 09/20/20 0749  . ezetimibe (ZETIA) tablet 10 mg  10 mg Oral Daily Hessie Knows, MD   10 mg at 09/20/20 0958  . fluticasone furoate-vilanterol (BREO ELLIPTA) 200-25 MCG/INH 1 puff  1 puff Inhalation Daily Hessie Knows, MD   1 puff at 09/20/20 0959  . Galcanezumab-gnlm SOAJ 120 mg  120 mg Subcutaneous Q30 days Hessie Knows, MD      . hydrALAZINE  (APRESOLINE) tablet 25 mg  25 mg Oral PRN Hessie Knows, MD      . hydrochlorothiazide (HYDRODIURIL) tablet 25 mg  25 mg Oral Daily Hessie Knows, MD   25 mg at 09/20/20 0957  . HYDROcodone-acetaminophen (NORCO) 7.5-325 MG per tablet 1-2 tablet  1-2 tablet Oral Q4H PRN Hessie Knows, MD   2 tablet at 09/20/20 0824  . HYDROcodone-acetaminophen (NORCO/VICODIN) 5-325 MG per tablet 1-2 tablet  1-2 tablet Oral Q4H PRN Hessie Knows, MD   1 tablet at 09/19/20 1528  . hydrOXYzine (ATARAX/VISTARIL) tablet 25 mg  25 mg Oral PRN Hessie Knows, MD      . irbesartan Levy Sjogren) tablet 300 mg  300 mg Oral Daily Hessie Knows, MD      . magnesium citrate solution 1 Bottle  1 Bottle Oral Once PRN Hessie Knows, MD      . menthol-cetylpyridinium (CEPACOL) lozenge 3 mg  1 lozenge Oral PRN Hessie Knows, MD       Or  . phenol (CHLORASEPTIC) mouth spray 1 spray  1 spray Mouth/Throat PRN Hessie Knows, MD      . methocarbamol (ROBAXIN) tablet 500 mg  500 mg Oral Q6H PRN Hessie Knows, MD       Or  . methocarbamol (ROBAXIN) 500 mg in dextrose 5 % 50 mL IVPB  500 mg Intravenous Q6H PRN Hessie Knows, MD      . metoCLOPramide (REGLAN) tablet 5-10 mg  5-10 mg Oral Q8H PRN Hessie Knows, MD       Or  . metoCLOPramide (REGLAN) injection 5-10 mg  5-10 mg Intravenous Q8H PRN Hessie Knows, MD      . morphine 2 MG/ML injection 0.5-1 mg  0.5-1 mg Intravenous Q2H PRN Hessie Knows, MD   0.5 mg at 09/19/20 1330  . ondansetron (ZOFRAN) tablet 4 mg  4 mg Oral Q6H PRN Hessie Knows, MD       Or  . ondansetron [facility]) injection 4 mg  4 mg Intravenous Q6H PRN Hessie Knows, MD      . pantoprazole (PROTONIX) EC tablet 40 mg  40 mg Oral Daily Hessie Knows, MD   40 mg at 09/20/20 0957  . polyethylene glycol (MIRALAX / GLYCOLAX) packet 17 g  17 g Oral Daily PRN Hessie Knows, MD      . potassium chloride (KLOR-CON) CR tablet 10 mEq  10 mEq Oral Daily Hessie Knows, MD   10 mEq at 09/20/20 0957  . potassium chloride (KLOR-CON) packet  20 mEq  20 mEq Oral TID Duanne Guess, PA-C   20 mEq at 09/20/20 5573  . SUMAtriptan (IMITREX) tablet 50 mg  50 mg Oral Q2H PRN Hessie Knows, MD      . traMADol Veatrice Bourbon) tablet 50 mg  50 mg Oral  Q6H Hessie Knows, MD   50 mg at 09/20/20 1610  . venlafaxine XR (EFFEXOR-XR) 24 hr capsule 75 mg  75 mg Oral Q breakfast Hessie Knows, MD   75 mg at 09/20/20 9604  . zolpidem (AMBIEN) tablet 5 mg  5 mg Oral QHS PRN Hessie Knows, MD         Discharge Medications: Please see discharge summary for a list of discharge medications.  Relevant Imaging Results:  Relevant Lab Results:   Additional Information SS# [id number]  Shelbie Ammons, RN

## 2020-09-20 NOTE — Progress Notes (Signed)
Physical Therapy Treatment Patient Details Name: Lindsey [name] MRN: [id number] DOB: September 20, 1936 Today's Date: 09/20/2020    History of Present Illness Pt is an 84 yo female diagnosed with OA of the L knee and is s/p elective L TKA.  PMH includes: HTN, PVD, COPD, PNA, breast CA, fibromyalgia, osteopenia, R TKA, and back surgery.    PT Comments    Pt put forth good effort during the session but ultimately did not make the progress anticipated after POD#0 session.  Pt required physical assistance with bed mobility tasks and to come to standing and was only able to amb a max of 3 feet before requiring to return to sitting.  Pt ambulated with a very slow, effortful step-to pattern often shuffling while advancing her RLE.  Pt was anxious with amb and stated several times that she felt as if she were going to fall but was actually steady during ambulation with no LOB.  Pt will benefit from PT services in a SNF setting upon discharge to safely address deficits listed in patient problem list for decreased caregiver assistance and eventual return to PLOF.     Follow Up Recommendations  Supervision for mobility/OOB;SNF     Equipment Recommendations  Rolling walker with 5" wheels    Recommendations for Other Services       Precautions / Restrictions Precautions Precautions: Fall Restrictions Weight Bearing Restrictions: Yes LLE Weight Bearing: Weight bearing as tolerated    Mobility  Bed Mobility Overal bed mobility: Needs Assistance Bed Mobility: Supine to Sit     Supine to sit: Min assist     General bed mobility comments: Min A for LLE control  Transfers Overall transfer level: Needs assistance Equipment used: Rolling walker (2 wheeled) Transfers: Sit to/from Stand Sit to Stand: Min assist         General transfer comment: Min verbal cues for sequencing and to come to full upright position  Ambulation/Gait Ambulation/Gait assistance: Min guard Gait Distance (Feet): 3  Feet Assistive device: Rolling walker (2 wheeled) Gait Pattern/deviations: Step-to pattern;Antalgic;Shuffle Gait velocity: decreased   General Gait Details: Pt able to lift and advance her LLE but unable to fully clear the floor with her RLE advancing it with a shuffle step pattern; max amb distance from bed to chair only with pt anxious and reported several times that she felt like she was going to fall   Stairs             Wheelchair Mobility    Modified Rankin (Stroke Patients Only)       Balance Overall balance assessment: Needs assistance   Sitting balance-Leahy Scale: Normal     Standing balance support: Bilateral upper extremity supported;During functional activity Standing balance-Leahy Scale: Fair Standing balance comment: heavy lean on the RW for support in standing                            Cognition Arousal/Alertness: Awake/alert Behavior During Therapy: WFL for tasks assessed/performed Overall Cognitive Status: Within Functional Limits for tasks assessed                                        Exercises Total Joint Exercises Ankle Circles/Pumps: AROM;Strengthening;Both;10 reps;5 reps Quad Sets: AROM;Strengthening;Left;5 reps;10 reps Gluteal Sets: Strengthening;Both;10 reps Hip ABduction/ADduction: AROM;AAROM;Both;5 reps;10 reps Straight Leg Raises: AAROM;AROM;Both;5 reps;10 reps Long Arc Quad: AROM;Left;10 reps;15  reps Knee Flexion: AROM;Left;10 reps;15 reps Goniometric ROM: L knee AROM: 6-73 deg Marching in Standing: AROM;Left;5 reps;Standing Other Exercises Other Exercises: Positioning education/review to encourage L knee ext PROM Other Exercises: HEP education/review per handout    General Comments        Pertinent Vitals/Pain Pain Assessment: 0-10 Pain Score: 7  Pain Location: L knee Pain Descriptors / Indicators: Sore Pain Intervention(s): Premedicated before session;Monitored during session    Home  Living                      Prior Function            PT Goals (current goals can now be found in the care plan section) Progress towards PT goals: Progressing toward goals    Frequency    BID      PT Plan Discharge plan needs to be updated    Co-evaluation              AM-PAC PT "6 Clicks" Mobility   Outcome Measure  Help needed turning from your back to your side while in a flat bed without using bedrails?: A Little Help needed moving from lying on your back to sitting on the side of a flat bed without using bedrails?: A Little Help needed moving to and from a bed to a chair (including a wheelchair)?: A Little Help needed standing up from a chair using your arms (e.g., wheelchair or bedside chair)?: A Little Help needed to walk in hospital room?: Total Help needed climbing 3-5 steps with a railing? : Total 6 Click Score: 14    End of Session Equipment Utilized During Treatment: Gait belt Activity Tolerance: Patient tolerated treatment well Patient left: in chair;with call bell/phone within reach;with chair alarm set;with SCD's reapplied;Other (comment) (polar care to L knee) Nurse Communication: Mobility status;Weight bearing status PT Visit Diagnosis: Other abnormalities of gait and mobility (R26.89);Muscle weakness (generalized) (M62.81);Pain Pain - Right/Left: Left Pain - part of body: Knee     Time: 0312-8118 PT Time Calculation (min) (ACUTE ONLY): 38 min  Charges:  $Gait Training: 8-22 mins $Therapeutic Exercise: 8-22 mins $Therapeutic Activity: 8-22 mins                     D. Scott Koji Niehoff PT, DPT 09/20/20, 1:39 PM

## 2020-09-20 NOTE — Progress Notes (Signed)
   Subjective: 1 Day Post-Op Procedure(s) (LRB): TOTAL KNEE ARTHROPLASTY (Left) Patient reports pain as moderate and severe.   Patient is well, and has had no acute complaints or problems Denies any CP, SOB, ABD pain. We will continue therapy today.  Plan is to go Home after hospital stay.  Objective: Vital signs in last 24 hours: Temp:  [97.6 F (36.4 C)-98.5 F (36.9 C)] 98.5 F (36.9 C) (12/17 0434) Pulse Rate:  [57-93] 93 (12/17 0434) Resp:  [10-18] 18 (12/17 0434) BP: (133-154)/(60-79) 136/78 (12/17 0434) SpO2:  [92 %-96 %] 94 % (12/17 0434)  Intake/Output from previous day: 12/16 0701 - 12/17 0700 In: 2070.1 [P.O.:120; I.V.:1850.1; IV Piggyback:100] Out: 1875 [Urine:1825; Blood:50] Intake/Output this shift: No intake/output data recorded.  Recent Labs    09/19/20 1542 09/20/20 0608  HGB 10.5* 10.6*   Recent Labs    09/19/20 1542 09/20/20 0608  WBC 8.5 6.2  RBC 3.46* 3.54*  HCT 31.0* 32.1*  PLT 179 168   Recent Labs    09/19/20 1542 09/20/20 0608  NA  --  135  K  --  3.3*  CL  --  99  CO2  --  28  BUN  --  18  CREATININE 0.47 0.47  GLUCOSE  --  111*  CALCIUM  --  8.2*   No results for input(s): LABPT, INR in the last 72 hours.  EXAM General - Patient is Alert, Appropriate and Oriented Extremity - Neurovascular intact Sensation intact distally Intact pulses distally Dorsiflexion/Plantar flexion intact No cellulitis present Compartment soft Dressing - dressing C/D/I and no drainage, Praveena intact without drainage Motor Function - intact, moving foot and toes well on exam.   Past Medical History:  Diagnosis Date  . Anemia   . Breast cancer ([place])   . Bronchiectasis ([place]) 06/2007  . Cancer ([place])   . Colitis   . COPD (chronic obstructive pulmonary disease) ([place])   . Depression   . DVT (deep venous thrombosis) ([place])    after her right knee surgery.   . Fibromyalgia   . Fibromyalgia   . Hypertension   . Migraine headache with aura   .  Osteoarthritis   . Osteoporosis   . PAD (peripheral artery disease) ([place])   . Pneumonia   . Polio 1952  . Stenosis of carotid artery     Assessment/Plan:   1 Day Post-Op Procedure(s) (LRB): TOTAL KNEE ARTHROPLASTY (Left) Active Problems:   S/P TKR (total knee replacement) using cement, bilateral  Estimated body mass index is 22.68 kg/m as calculated from the following:   Height as of this encounter: 5\' 2"  (1.575 m).   Weight as of this encounter: 56.2 kg. Advance diet Up with therapy  Pain moderate to severe with taking very little pain medication. Vital signs are stable Labs are stable. Hypokalemia 3.3. -Oral supplementation, recheck labs in the morning Care manager to assist with discharge to home with home health PT  DVT Prophylaxis - Lovenox, TED hose and SCDs Weight-Bearing as tolerated to left leg   T. Rachelle Hora, PA-C [place] 09/20/2020, 7:56 AM

## 2020-09-21 ENCOUNTER — Inpatient Hospital Stay: Payer: PPO

## 2020-09-21 LAB — BASIC METABOLIC PANEL
Anion gap: 8 (ref 5–15)
BUN: 11 mg/dL (ref 8–23)
CO2: 28 mmol/L (ref 22–32)
Calcium: 8.3 mg/dL — ABNORMAL LOW (ref 8.9–10.3)
Chloride: 98 mmol/L (ref 98–111)
Creatinine, Ser: 0.49 mg/dL (ref 0.44–1.00)
GFR, Estimated: >60 mL/min (ref >60)
Glucose, Bld: 81 mg/dL (ref 70–99)
Potassium: 3.3 mmol/L — ABNORMAL LOW (ref 3.5–5.1)
Sodium: 134 mmol/L — ABNORMAL LOW (ref 135–145)

## 2020-09-21 LAB — CBC
HCT: 30.7 % — ABNORMAL LOW (ref 36.0–46.0)
Hemoglobin: 10.4 g/dL — ABNORMAL LOW (ref 12.0–15.0)
MCH: 30.8 pg (ref 26.0–34.0)
MCHC: 33.9 g/dL (ref 30.0–36.0)
MCV: 90.8 fL (ref 80.0–100.0)
Platelets: 171 10*3/uL (ref 150–400)
RBC: 3.38 MIL/uL — ABNORMAL LOW (ref 3.87–5.11)
RDW: 13.2 % (ref 11.5–15.5)
WBC: 4.8 10*3/uL (ref 4.0–10.5)
nRBC: 0 % (ref 0.0–0.2)

## 2020-09-21 LAB — ABO/RH: ABO/RH(D): A NEG

## 2020-09-21 NOTE — Progress Notes (Signed)
PT Cancellation Note  Patient Details Name: Lindsey [name] MRN: [id number] DOB: 1935/11/28   Cancelled Treatment:     Therapist returned for PM session however PT session held. Pt was c/o calf pain. Removed TED stocking with LLE red and warm to touch. RN notified and MD contacted. Will resume PT after DVT ruled out.    Willette Pa 09/21/2020, 4:15 PM

## 2020-09-21 NOTE — Progress Notes (Addendum)
Physical Therapy Treatment Patient Details Name: Lindsey [name] MRN: [id number] DOB: 06-Jun-1936 Today's Date: 09/21/2020    History of Present Illness Pt is an 84 yo female diagnosed with OA of the L knee and is s/p elective L TKA.  PMH includes: HTN, PVD, COPD, PNA, breast CA, fibromyalgia, osteopenia, R TKA, and back surgery.    PT Comments    Pt was supine in bed with HOB elevated ~ 20 degrees upon arriving.  She agrees to session and is cooperative throughout. Requested to use BM. Wanted to use bedpan but was convinced to use BSC. Pt was able to exit R side of bed with slightly more assistance today. Stood pivot to [facility] then stood pivot to recliner. Urinated 400 cc. Therapist reviewed HEP and pt states she feel confident in her abilities to perform. Highly recommend DC to SNF ([facility] [facility] is her primary residence) at [place] to address deficits while assisting pt to PLOF. Pt was sitting in recliner with call bell in reach, chair alarm in place, and pt resting comfortably at conclusion of session. Acute PT will continue to follow per POC.    Follow Up Recommendations  SNF;Supervision for mobility/OOB     Equipment Recommendations  Rolling walker with 5" wheels    Recommendations for Other Services       Precautions / Restrictions Precautions Precautions: Fall Precaution Comments: Knee Restrictions Weight Bearing Restrictions: Yes LLE Weight Bearing: Weight bearing as tolerated    Mobility  Bed Mobility Overal bed mobility: Needs Assistance Bed Mobility: Rolling Rolling: Min assist   Supine to sit: Min assist;Mod assist     General bed mobility comments: Min/mod assist to exit R side of bed.  Transfers Overall transfer level: Needs assistance Equipment used: Rolling walker (2 wheeled) Transfers: Sit to/from Stand Sit to Stand: Min assist;Mod assist;From elevated surface         General transfer comment: pt was able to stand from elevated bed height, take  several antagic stepsto BSC. urinate 400 CC than stand and take several steps to recliner. Pt quickly wanted to return to bed but was convinced to stay sitting in chair through lunch time.  Ambulation/Gait Ambulation/Gait assistance: Min assist Gait Distance (Feet): 3 Feet Assistive device: Rolling walker (2 wheeled) Gait Pattern/deviations: Step-to pattern;Antalgic;Shuffle Gait velocity: decreased   General Gait Details: Pt is limited by inability to tolerate full wt bearing to allow opposite LE advancement       Balance Overall balance assessment: Needs assistance   Sitting balance-Leahy Scale: Normal Sitting balance - Comments: no balance deficits in sitting       Cognition Arousal/Alertness: Awake/alert Behavior During Therapy: WFL for tasks assessed/performed Overall Cognitive Status: Within Functional Limits for tasks assessed    General Comments: Pt is A and O x 4 however during session did show some slight cognition concerns but overall did not impact session             Pertinent Vitals/Pain Pain Assessment: 0-10 Pain Score: 5  Pain Location: L knee Pain Descriptors / Indicators: Sore;Discomfort;Grimacing;Guarding Pain Intervention(s): Limited activity within patient's tolerance;Monitored during session;Premedicated before session;Repositioned;Ice applied           PT Goals (current goals can now be found in the care plan section) Acute Rehab PT Goals Patient Stated Goal: To get better and get home." Progress towards PT goals: Progressing toward goals (slowed progress)    Frequency    BID      PT Plan Current plan remains  appropriate    AM-PAC PT "6 Clicks" Mobility   Outcome Measure  Help needed turning from your back to your side while in a flat bed without using bedrails?: A Little Help needed moving from lying on your back to sitting on the side of a flat bed without using bedrails?: A Little Help needed moving to and from a bed to a chair  (including a wheelchair)?: A Little Help needed standing up from a chair using your arms (e.g., wheelchair or bedside chair)?: A Little Help needed to walk in hospital room?: Total Help needed climbing 3-5 steps with a railing? : Total 6 Click Score: 14    End of Session Equipment Utilized During Treatment: Gait belt Activity Tolerance: Patient limited by pain;Patient limited by fatigue Patient left: with call bell/phone within reach;with chair alarm set;in chair Nurse Communication: Mobility status;Weight bearing status PT Visit Diagnosis: Other abnormalities of gait and mobility (R26.89);Muscle weakness (generalized) (M62.81);Pain Pain - Right/Left: Left Pain - part of body: Knee     Time: 1030-1055 PT Time Calculation (min) (ACUTE ONLY): 25 min  Charges:  $Therapeutic Activity: 23-37 mins                     Julaine Fusi PTA 09/21/20, 4:15 PM

## 2020-09-21 NOTE — Progress Notes (Signed)
Patient complaining of left calf area painful, red, and warm. Dr Rudene Christians paged. Order for dopppler to rule out DVT placed.

## 2020-09-21 NOTE — Progress Notes (Signed)
   Subjective: 2 Days Post-Op Procedure(s) (LRB): TOTAL KNEE ARTHROPLASTY (Left) Patient reports pain as moderate and severe.   Patient is well, and has had no acute complaints or problems Denies any CP, SOB, ABD pain. We will continue therapy today.  Plan is to go Home after hospital stay.  Objective: Vital signs in last 24 hours: Temp:  [97.8 F (36.6 C)-98.7 F (37.1 C)] 98.4 F (36.9 C) (12/18 0521) Pulse Rate:  [71-84] 71 (12/18 0521) Resp:  [16-19] 17 (12/18 0521) BP: (138-161)/(68-84) 161/68 (12/18 0521) SpO2:  [91 %-96 %] 96 % (12/18 0521)  Intake/Output from previous day: 12/17 0701 - 12/18 0700 In: 0  Out: 375 [Urine:375] Intake/Output this shift: No intake/output data recorded.  Recent Labs    09/19/20 1542 09/20/20 0608 09/21/20 0557  HGB 10.5* 10.6* 10.4*   Recent Labs    09/20/20 0608 09/21/20 0557  WBC 6.2 4.8  RBC 3.54* 3.38*  HCT 32.1* 30.7*  PLT 168 171   Recent Labs    09/20/20 0608 09/21/20 0557  NA 135 134*  K 3.3* 3.3*  CL 99 98  CO2 28 28  BUN 18 11  CREATININE 0.47 0.49  GLUCOSE 111* 81  CALCIUM 8.2* 8.3*   No results for input(s): LABPT, INR in the last 72 hours.  EXAM General - Patient is Alert, Appropriate and Oriented Extremity - Neurovascular intact Sensation intact distally Intact pulses distally Dorsiflexion/Plantar flexion intact No cellulitis present Compartment soft Dressing - dressing C/D/I and no drainage, Praveena intact without drainage Motor Function - intact, moving foot and toes well on exam.  Ambulated 3 feet.  Past Medical History:  Diagnosis Date  . Anemia   . Breast cancer ([place])   . Bronchiectasis ([place]) 06/2007  . Cancer ([place])   . Colitis   . COPD (chronic obstructive pulmonary disease) ([place])   . Depression   . DVT (deep venous thrombosis) ([place])    after her right knee surgery.   . Fibromyalgia   . Fibromyalgia   . Hypertension   . Migraine headache with aura   . Osteoarthritis   .  Osteoporosis   . PAD (peripheral artery disease) ([place])   . Pneumonia   . Polio 1952  . Stenosis of carotid artery     Assessment/Plan:   2 Days Post-Op Procedure(s) (LRB): TOTAL KNEE ARTHROPLASTY (Left) Active Problems:   S/P TKR (total knee replacement) using cement, bilateral  Estimated body mass index is 22.68 kg/m as calculated from the following:   Height as of this encounter: 5\' 2"  (1.575 m).   Weight as of this encounter: 56.2 kg. Advance diet Up with therapy  Pain moderate to severe with taking very little pain medication. Vital signs are stable Labs are stable. Hypokalemia 3.3. -Oral supplementation, recheck labs in the morning Care manager to assist with discharge to home with home health PT.  Plan for Sunday.  DVT Prophylaxis - Lovenox, TED hose and SCDs Weight-Bearing as tolerated to left leg   Reche Dixon PA-C [place] 09/21/2020, 7:32 AM

## 2020-09-22 NOTE — Progress Notes (Signed)
OT Cancellation Note  Patient Details Name: Lindsey [name] MRN: [id number] DOB: 1935-10-13   Cancelled Treatment:    Reason Eval/Treat Not Completed: Patient declined, no reason specified. On attempt, pt eating breakfast, requesting therapy come back at later time.  Jeni Salles, MPH, MS, OTR/L ascom [phone] 09/22/20, 9:26 AM

## 2020-09-22 NOTE — Progress Notes (Signed)
° °  Subjective: 3 Days Post-Op Procedure(s) (LRB): TOTAL KNEE ARTHROPLASTY (Left) Patient reports pain as moderate and severe.   Patient is well, and has had no acute complaints or problems Denies any CP, SOB, ABD pain. We will continue therapy today.  Plan is to go to the [place] after hospital stay.  Objective: Vital signs in last 24 hours: Temp:  [97.9 F (36.6 C)-98.9 F (37.2 C)] 98.6 F (37 C) (12/19 0443) Pulse Rate:  [72-103] 72 (12/19 0443) Resp:  [16-18] 16 (12/19 0443) BP: (122-153)/(68-74) 153/68 (12/19 0443) SpO2:  [90 %-99 %] 95 % (12/19 0443)  Intake/Output from previous day: 12/18 0701 - 12/19 0700 In: 878.6 [I.V.:878.6] Out: 200 [Urine:200] Intake/Output this shift: Total I/O In: -  Out: 200 [Urine:200]  Recent Labs    09/19/20 1542 09/20/20 0608 09/21/20 0557  HGB 10.5* 10.6* 10.4*   Recent Labs    09/20/20 0608 09/21/20 0557  WBC 6.2 4.8  RBC 3.54* 3.38*  HCT 32.1* 30.7*  PLT 168 171   Recent Labs    09/20/20 0608 09/21/20 0557  NA 135 134*  K 3.3* 3.3*  CL 99 98  CO2 28 28  BUN 18 11  CREATININE 0.47 0.49  GLUCOSE 111* 81  CALCIUM 8.2* 8.3*   No results for input(s): LABPT, INR in the last 72 hours.  EXAM General - Patient is Alert, Appropriate and Oriented Extremity - Neurovascular intact Sensation intact distally Intact pulses distally Dorsiflexion/Plantar flexion intact No cellulitis present Compartment soft Dressing - dressing C/D/I and no drainage, Praveena intact without drainage Motor Function - intact, moving foot and toes well on exam.  Ambulated 3 feet.  Past Medical History:  Diagnosis Date   Anemia    Breast cancer ([place])    Bronchiectasis ([place]) 06/2007   Cancer ([place])    Colitis    COPD (chronic obstructive pulmonary disease) ([place])    Depression    DVT (deep venous thrombosis) ([place])    after her right knee surgery.    Fibromyalgia    Fibromyalgia    Hypertension    Migraine  headache with aura    Osteoarthritis    Osteoporosis    PAD (peripheral artery disease) ([facility])    Pneumonia    Polio 1952   Stenosis of carotid artery     Assessment/Plan:   3 Days Post-Op Procedure(s) (LRB): TOTAL KNEE ARTHROPLASTY (Left) Active Problems:   S/P TKR (total knee replacement) using cement, bilateral  Estimated body mass index is 22.68 kg/m as calculated from the following:   Height as of this encounter: 5\' 2"  (1.575 m).   Weight as of this encounter: 56.2 kg. Advance diet Up with therapy  Pain moderate to severe with taking very little pain medication. Vital signs are stable Labs are stable. Hypokalemia 3.3. -Oral supplementation, recheck labs in the morning Care manager to assist with discharge to home with home health PT.  Plan for today.  DVT Prophylaxis - Lovenox, TED hose and SCDs Weight-Bearing as tolerated to left leg   Reche Dixon PA-C [place] 09/22/2020, 6:56 AM

## 2020-09-22 NOTE — Progress Notes (Signed)
Physical Therapy Treatment Patient Details Name: Lindsey [name] MRN: [id number] DOB: Jul 12, 1936 Today's Date: 09/22/2020    History of Present Illness Pt is an 84 yo female diagnosed with OA of the L knee and is s/p elective L TKA.  PMH includes: HTN, PVD, COPD, PNA, breast CA, fibromyalgia, osteopenia, R TKA, and back surgery.    PT Comments    DVT negative yesterday per imaging. Participated in exercises as described below.  Stood with min a x 1 and able to walk 5' x 3 with RW and min guard.  Able to void on commode.  Returned to bed per her request.  Pt did not feel as if she could progress gait further today but overall did well with gait with minimal assist.    Follow Up Recommendations  SNF;Supervision for mobility/OOB     Equipment Recommendations  Rolling walker with 5" wheels    Recommendations for Other Services       Precautions / Restrictions Precautions Precautions: Fall Precaution Comments: Knee Restrictions Weight Bearing Restrictions: Yes LLE Weight Bearing: Weight bearing as tolerated    Mobility  Bed Mobility Overal bed mobility: Needs Assistance Bed Mobility: Sit to Supine       Sit to supine: Min assist      Transfers Overall transfer level: Needs assistance Equipment used: Rolling walker (2 wheeled) Transfers: Sit to/from Stand Sit to Stand: Min assist            Ambulation/Gait Ambulation/Gait assistance: Min guard;Min Web designer (Feet): 5 Feet Assistive device: Rolling walker (2 wheeled) Gait Pattern/deviations: Step-to pattern Gait velocity: decreased   General Gait Details: 5' x 3   Stairs             Wheelchair Mobility    Modified Rankin (Stroke Patients Only)       Balance Overall balance assessment: Needs assistance   Sitting balance-Leahy Scale: Normal Sitting balance - Comments: no balance deficits in sitting   Standing balance support: Bilateral upper extremity supported;During functional  activity Standing balance-Leahy Scale: Fair Standing balance comment: heavy lean on the RW for support in standing                            Cognition Arousal/Alertness: Awake/alert Behavior During Therapy: WFL for tasks assessed/performed Overall Cognitive Status: Within Functional Limits for tasks assessed                                        Exercises Total Joint Exercises Ankle Circles/Pumps: AROM;Strengthening;Both;10 reps;5 reps Quad Sets: AROM;Strengthening;Left;5 reps;10 reps Gluteal Sets: Strengthening;Both;10 reps Hip ABduction/ADduction: AROM;AAROM;Both;5 reps;10 reps Straight Leg Raises: AAROM;AROM;Both;5 reps;10 reps Long Arc Quad: AROM;Left;10 reps;15 reps Knee Flexion: AROM;Left;10 reps;15 reps Goniometric ROM: 4-80 Marching in Standing: AROM;Left;5 reps;Standing    General Comments        Pertinent Vitals/Pain Pain Assessment: Faces Faces Pain Scale: Hurts a little bit Pain Location: L knee Pain Descriptors / Indicators: Sore;Discomfort;Grimacing;Guarding Pain Intervention(s): Limited activity within patient's tolerance;Monitored during session;Repositioned;Ice applied    Home Living                      Prior Function            PT Goals (current goals can now be found in the care plan section) Progress towards PT goals: Progressing toward goals  Frequency    BID      PT Plan Current plan remains appropriate    Co-evaluation              AM-PAC PT "6 Clicks" Mobility   Outcome Measure    Help needed moving from lying on your back to sitting on the side of a flat bed without using bedrails?: A Little Help needed moving to and from a bed to a chair (including a wheelchair)?: A Little Help needed standing up from a chair using your arms (e.g., wheelchair or bedside chair)?: A Little Help needed to walk in hospital room?: A Little Help needed climbing 3-5 steps with a railing? : Total 6 Click  Score: 13    End of Session Equipment Utilized During Treatment: Gait belt Activity Tolerance: Patient tolerated treatment well Patient left: in bed;with call bell/phone within reach;with bed alarm set Nurse Communication: Mobility status;Weight bearing status Pain - Right/Left: Left Pain - part of body: Knee     Time: 1128-1212 PT Time Calculation (min) (ACUTE ONLY): 44 min  Charges:  $Gait Training: 8-22 mins $Therapeutic Exercise: 8-22 mins $Therapeutic Activity: 8-22 mins                    Chesley Noon, PTA 09/22/20, 12:20 PM

## 2020-09-22 NOTE — TOC Progression Note (Signed)
Transition of Care [facility]) - Progression Note    Patient Details  Name: Lindsey [name] MRN: [id number] Date of Birth: Dec 21, 1935  Transition of Care [facility]) CM/SW Contact  Izola Price, RN Phone Number: 09/22/2020, 10:13 AM  Clinical Narrative:    12/19 1010 Left VM at [facility] [phone] to check insurance authorization status. Left weekend number for today and weekday CM name who filed for weekday call back. Will monitor today.  Simmie Davies RN CM    Expected Discharge Plan: Skilled Nursing Facility Barriers to Discharge: No Barriers Identified  Expected Discharge Plan and Services Expected Discharge Plan: [place] Choice: [place] Living arrangements for the past 2 months: [place] Expected Discharge Date: 09/22/20                                     Social Determinants of Health (SDOH) Interventions    Readmission Risk Interventions No flowsheet data found.

## 2020-09-22 NOTE — Progress Notes (Signed)
Patient had a BM. Waiting on insurance auth to discharge.

## 2020-09-23 DIAGNOSIS — M5030 Other cervical disc degeneration, unspecified cervical region: Secondary | ICD-10-CM | POA: Diagnosis not present

## 2020-09-23 DIAGNOSIS — M797 Fibromyalgia: Secondary | ICD-10-CM | POA: Diagnosis not present

## 2020-09-23 DIAGNOSIS — M1990 Unspecified osteoarthritis, unspecified site: Secondary | ICD-10-CM | POA: Diagnosis not present

## 2020-09-23 DIAGNOSIS — J449 Chronic obstructive pulmonary disease, unspecified: Secondary | ICD-10-CM | POA: Diagnosis not present

## 2020-09-23 DIAGNOSIS — R262 Difficulty in walking, not elsewhere classified: Secondary | ICD-10-CM | POA: Diagnosis not present

## 2020-09-23 DIAGNOSIS — R2689 Other abnormalities of gait and mobility: Secondary | ICD-10-CM | POA: Diagnosis not present

## 2020-09-23 DIAGNOSIS — E876 Hypokalemia: Secondary | ICD-10-CM | POA: Diagnosis not present

## 2020-09-23 DIAGNOSIS — J479 Bronchiectasis, uncomplicated: Secondary | ICD-10-CM | POA: Diagnosis not present

## 2020-09-23 DIAGNOSIS — M25562 Pain in left knee: Secondary | ICD-10-CM | POA: Diagnosis not present

## 2020-09-23 DIAGNOSIS — Z471 Aftercare following joint replacement surgery: Secondary | ICD-10-CM | POA: Diagnosis not present

## 2020-09-23 DIAGNOSIS — G43119 Migraine with aura, intractable, without status migrainosus: Secondary | ICD-10-CM | POA: Diagnosis not present

## 2020-09-23 DIAGNOSIS — F3340 Major depressive disorder, recurrent, in remission, unspecified: Secondary | ICD-10-CM | POA: Diagnosis not present

## 2020-09-23 DIAGNOSIS — R279 Unspecified lack of coordination: Secondary | ICD-10-CM | POA: Diagnosis not present

## 2020-09-23 DIAGNOSIS — Z96652 Presence of left artificial knee joint: Secondary | ICD-10-CM | POA: Diagnosis not present

## 2020-09-23 DIAGNOSIS — M6281 Muscle weakness (generalized): Secondary | ICD-10-CM | POA: Diagnosis not present

## 2020-09-23 DIAGNOSIS — M7062 Trochanteric bursitis, left hip: Secondary | ICD-10-CM | POA: Diagnosis not present

## 2020-09-23 DIAGNOSIS — M7061 Trochanteric bursitis, right hip: Secondary | ICD-10-CM | POA: Diagnosis not present

## 2020-09-23 DIAGNOSIS — Z86718 Personal history of other venous thrombosis and embolism: Secondary | ICD-10-CM | POA: Diagnosis not present

## 2020-09-23 DIAGNOSIS — I10 Essential (primary) hypertension: Secondary | ICD-10-CM | POA: Diagnosis not present

## 2020-09-23 DIAGNOSIS — R5381 Other malaise: Secondary | ICD-10-CM | POA: Diagnosis not present

## 2020-09-23 DIAGNOSIS — M62838 Other muscle spasm: Secondary | ICD-10-CM | POA: Diagnosis not present

## 2020-09-23 LAB — RESP PANEL BY RT-PCR (FLU A&B, COVID) ARPGX2
Influenza A by PCR: NEGATIVE
Influenza B by PCR: NEGATIVE
SARS Coronavirus 2 by RT PCR: NEGATIVE

## 2020-09-23 MED ORDER — GALCANEZUMAB-GNLM 120 MG/ML ~~LOC~~ SOAJ: 120.0000 mg | SUBCUTANEOUS | Status: DC

## 2020-09-23 NOTE — Progress Notes (Signed)
   Subjective: 4 Days Post-Op Procedure(s) (LRB): TOTAL KNEE ARTHROPLASTY (Left) Patient reports pain as mild.   Patient is well, and has had no acute complaints or problems Denies any CP, SOB, ABD pain. We will continue therapy today.  Plan is to go to the [place] after hospital stay.  Objective: Vital signs in last 24 hours: Temp:  [97.9 F (36.6 C)-98.6 F (37 C)] 98.1 F (36.7 C) (12/20 0801) Pulse Rate:  [67-86] 68 (12/20 0801) Resp:  [15-19] 16 (12/20 0801) BP: (115-162)/(50-91) 153/87 (12/20 0801) SpO2:  [92 %-97 %] 97 % (12/20 0801)  Intake/Output from previous day: No intake/output data recorded. Intake/Output this shift: No intake/output data recorded.  Recent Labs    09/21/20 0557  HGB 10.4*   Recent Labs    09/21/20 0557  WBC 4.8  RBC 3.38*  HCT 30.7*  PLT 171   Recent Labs    09/21/20 0557  NA 134*  K 3.3*  CL 98  CO2 28  BUN 11  CREATININE 0.49  GLUCOSE 81  CALCIUM 8.3*   No results for input(s): LABPT, INR in the last 72 hours.  EXAM General - Patient is Alert, Appropriate and Oriented Extremity - Neurovascular intact Sensation intact distally Intact pulses distally Dorsiflexion/Plantar flexion intact No cellulitis present Compartment soft Dressing - dressing C/D/I and no drainage, Praveena intact without drainage Motor Function - intact, moving foot and toes well on exam.  Ambulated 3 feet.  Past Medical History:  Diagnosis Date  . Anemia   . Breast cancer ([place])   . Bronchiectasis ([place]) 06/2007  . Cancer ([place])   . Colitis   . COPD (chronic obstructive pulmonary disease) ([place])   . Depression   . DVT (deep venous thrombosis) ([place])    after her right knee surgery.   . Fibromyalgia   . Fibromyalgia   . Hypertension   . Migraine headache with aura   . Osteoarthritis   . Osteoporosis   . PAD (peripheral artery disease) ([place])   . Pneumonia   . Polio 1952  . Stenosis of carotid artery     Assessment/Plan:   4  Days Post-Op Procedure(s) (LRB): TOTAL KNEE ARTHROPLASTY (Left) Active Problems:   S/P TKR (total knee replacement) using cement, bilateral  Estimated body mass index is 22.68 kg/m as calculated from the following:   Height as of this encounter: 5\' 2"  (1.575 m).   Weight as of this encounter: 56.2 kg. Advance diet Up with therapy  Pain well controlled this morning. Vital signs are stable Care management to assist with discharge to SNF  DVT Prophylaxis - Lovenox, TED hose and SCDs Weight-Bearing as tolerated to left leg   T. Rachelle Hora PA-C [place] 09/23/2020, 8:22 AM

## 2020-09-23 NOTE — TOC Progression Note (Signed)
Transition of Care [facility]) - Progression Note    Patient Details  Name: Lindsey [name] MRN: [id number] Date of Birth: 1936/05/28  Transition of Care [facility]) CM/SW [place], RN Phone Number: 09/23/2020, 8:45 AM  Clinical Narrative:   RNCM received insurance auth through [facility]. Ambulance authorization number [id number] and [place] authorization number of [id number].     Expected Discharge Plan: [place] Barriers to Discharge: No Barriers Identified  Expected Discharge Plan and Services Expected Discharge Plan: [place] Choice: [place] Living arrangements for the past 2 months: [place] Expected Discharge Date: 09/23/20                                     Social Determinants of Health (SDOH) Interventions    Readmission Risk Interventions No flowsheet data found.

## 2020-09-23 NOTE — Care Management Important Message (Signed)
Important Message  Patient Details  Name: Lindsey [name] MRN: [id number] Date of Birth: 14-Apr-1936   Medicare Important Message Given:  Yes     [name] 09/23/2020, 10:19 AM

## 2020-09-23 NOTE — Plan of Care (Signed)

## 2020-09-23 NOTE — Progress Notes (Signed)
Physical Therapy Treatment Patient Details Name: Lindsey [name] MRN: [id number] DOB: 10-22-1935 Today's Date: 09/23/2020    History of Present Illness Pt is an 84 yo female diagnosed with OA of the L knee and is s/p elective L TKA.  PMH includes: HTN, PVD, COPD, PNA, breast CA, fibromyalgia, osteopenia, R TKA, and back surgery.    PT Comments    Participated in exercises as described below.  To EOB with increased time and rails but no assist.  Steady in sitting.  Stood with min a x 1 and is able to progress gait 40' today with very slow but steady gait.  To commode to void before electing to return to bed.  Stated she was tired and did not want to remain in recliner at this time.     Follow Up Recommendations  SNF;Supervision for mobility/OOB     Equipment Recommendations  Rolling walker with 5" wheels    Recommendations for Other Services       Precautions / Restrictions Precautions Precautions: Fall Precaution Comments: Knee Restrictions Weight Bearing Restrictions: Yes LLE Weight Bearing: Weight bearing as tolerated    Mobility  Bed Mobility Overal bed mobility: Needs Assistance Bed Mobility: Supine to Sit;Sit to Supine     Supine to sit: Min assist Sit to supine: Mod assist      Transfers Overall transfer level: Needs assistance Equipment used: Rolling walker (2 wheeled) Transfers: Sit to/from Stand Sit to Stand: Min assist            Ambulation/Gait Ambulation/Gait assistance: Min guard;Min assist Gait Distance (Feet): 40 Feet Assistive device: Rolling walker (2 wheeled) Gait Pattern/deviations: Step-to pattern Gait velocity: decreased   General Gait Details: slow cautious gait   Stairs             Wheelchair Mobility    Modified Rankin (Stroke Patients Only)       Balance Overall balance assessment: Needs assistance   Sitting balance-Leahy Scale: Normal Sitting balance - Comments: no balance deficits in sitting   Standing  balance support: Bilateral upper extremity supported;During functional activity Standing balance-Leahy Scale: Fair Standing balance comment: heavy lean on the RW for support in standing                            Cognition Arousal/Alertness: Awake/alert Behavior During Therapy: WFL for tasks assessed/performed Overall Cognitive Status: Within Functional Limits for tasks assessed                                 General Comments: Pt is A and O x 4 however during session did show some slight cognition concerns but overall did not impact session      Exercises Total Joint Exercises Ankle Circles/Pumps: AROM;Strengthening;Both;10 reps;5 reps Quad Sets: AROM;Strengthening;Left;5 reps;10 reps Gluteal Sets: Strengthening;Both;10 reps Heel Slides: AAROM;Left;5 reps;10 reps Hip ABduction/ADduction: AROM;AAROM;Both;5 reps;10 reps Straight Leg Raises: AAROM;AROM;Both;5 reps;10 reps Long Arc Quad: AROM;Left;10 reps;15 reps Knee Flexion: AROM;Left;10 reps;15 reps Goniometric ROM: 3-85 Other Exercises Other Exercises: to commode to void    General Comments        Pertinent Vitals/Pain Pain Assessment: Faces Faces Pain Scale: Hurts even more Pain Location: L knee Pain Descriptors / Indicators: Sore;Discomfort;Grimacing;Guarding Pain Intervention(s): Limited activity within patient's tolerance;Monitored during session;Repositioned;Ice applied    Home Living  Prior Function            PT Goals (current goals can now be found in the care plan section) Progress towards PT goals: Progressing toward goals    Frequency    BID      PT Plan Current plan remains appropriate    Co-evaluation              AM-PAC PT "6 Clicks" Mobility   Outcome Measure  Help needed turning from your back to your side while in a flat bed without using bedrails?: A Little Help needed moving from lying on your back to sitting on the side of  a flat bed without using bedrails?: A Little Help needed moving to and from a bed to a chair (including a wheelchair)?: A Little Help needed standing up from a chair using your arms (e.g., wheelchair or bedside chair)?: A Little Help needed to walk in hospital room?: A Little Help needed climbing 3-5 steps with a railing? : Total 6 Click Score: 16    End of Session Equipment Utilized During Treatment: Gait belt Activity Tolerance: Patient tolerated treatment well Patient left: in bed;with call bell/phone within reach;with bed alarm set Nurse Communication: Mobility status;Weight bearing status Pain - Right/Left: Left Pain - part of body: Knee     Time: 0930-1002 PT Time Calculation (min) (ACUTE ONLY): 32 min  Charges:  $Gait Training: 8-22 mins $Therapeutic Exercise: 8-22 mins                    Chesley Noon, PTA 09/23/20, 10:30 AM

## 2020-09-23 NOTE — Progress Notes (Signed)
Pt discharge to [facility] at the [facility] via EMS. Discharge packet given to EMS to give to facility.  VSS, No s/s of distress.

## 2020-09-24 DIAGNOSIS — Z86718 Personal history of other venous thrombosis and embolism: Secondary | ICD-10-CM | POA: Diagnosis not present

## 2020-09-24 DIAGNOSIS — J449 Chronic obstructive pulmonary disease, unspecified: Secondary | ICD-10-CM | POA: Diagnosis not present

## 2020-09-24 DIAGNOSIS — Z96652 Presence of left artificial knee joint: Secondary | ICD-10-CM | POA: Diagnosis not present

## 2020-10-03 ENCOUNTER — Inpatient Hospital Stay: Admission: RE | Admit: 2020-10-03 | Payer: PPO | Source: Ambulatory Visit

## 2020-10-11 ENCOUNTER — Other Ambulatory Visit: Payer: PPO

## 2020-10-16 DIAGNOSIS — Z471 Aftercare following joint replacement surgery: Secondary | ICD-10-CM | POA: Diagnosis not present

## 2020-10-16 DIAGNOSIS — M6281 Muscle weakness (generalized): Secondary | ICD-10-CM | POA: Diagnosis not present

## 2020-10-16 DIAGNOSIS — R262 Difficulty in walking, not elsewhere classified: Secondary | ICD-10-CM | POA: Diagnosis not present

## 2020-10-16 DIAGNOSIS — R2689 Other abnormalities of gait and mobility: Secondary | ICD-10-CM | POA: Diagnosis not present

## 2020-10-16 DIAGNOSIS — Z9181 History of falling: Secondary | ICD-10-CM | POA: Diagnosis not present

## 2020-10-16 DIAGNOSIS — M25562 Pain in left knee: Secondary | ICD-10-CM | POA: Diagnosis not present

## 2020-10-16 DIAGNOSIS — Z96653 Presence of artificial knee joint, bilateral: Secondary | ICD-10-CM | POA: Diagnosis not present

## 2020-10-18 DIAGNOSIS — M25562 Pain in left knee: Secondary | ICD-10-CM | POA: Diagnosis not present

## 2020-10-18 DIAGNOSIS — Z471 Aftercare following joint replacement surgery: Secondary | ICD-10-CM | POA: Diagnosis not present

## 2020-10-18 DIAGNOSIS — Z9181 History of falling: Secondary | ICD-10-CM | POA: Diagnosis not present

## 2020-10-18 DIAGNOSIS — R262 Difficulty in walking, not elsewhere classified: Secondary | ICD-10-CM | POA: Diagnosis not present

## 2020-10-18 DIAGNOSIS — M6281 Muscle weakness (generalized): Secondary | ICD-10-CM | POA: Diagnosis not present

## 2020-10-18 DIAGNOSIS — R2689 Other abnormalities of gait and mobility: Secondary | ICD-10-CM | POA: Diagnosis not present

## 2020-10-18 DIAGNOSIS — Z96653 Presence of artificial knee joint, bilateral: Secondary | ICD-10-CM | POA: Diagnosis not present

## 2020-10-21 ENCOUNTER — Other Ambulatory Visit: Payer: Self-pay

## 2020-10-21 ENCOUNTER — Encounter: Payer: Self-pay | Admitting: *Deleted

## 2020-10-21 ENCOUNTER — Emergency Department: Payer: PPO

## 2020-10-21 DIAGNOSIS — Z79899 Other long term (current) drug therapy: Secondary | ICD-10-CM | POA: Insufficient documentation

## 2020-10-21 DIAGNOSIS — Z96653 Presence of artificial knee joint, bilateral: Secondary | ICD-10-CM | POA: Diagnosis not present

## 2020-10-21 DIAGNOSIS — Z853 Personal history of malignant neoplasm of breast: Secondary | ICD-10-CM | POA: Diagnosis not present

## 2020-10-21 DIAGNOSIS — J449 Chronic obstructive pulmonary disease, unspecified: Secondary | ICD-10-CM | POA: Insufficient documentation

## 2020-10-21 DIAGNOSIS — I10 Essential (primary) hypertension: Secondary | ICD-10-CM | POA: Diagnosis not present

## 2020-10-21 DIAGNOSIS — Z8673 Personal history of transient ischemic attack (TIA), and cerebral infarction without residual deficits: Secondary | ICD-10-CM | POA: Diagnosis not present

## 2020-10-21 DIAGNOSIS — R2242 Localized swelling, mass and lump, left lower limb: Secondary | ICD-10-CM | POA: Diagnosis present

## 2020-10-21 DIAGNOSIS — Z7951 Long term (current) use of inhaled steroids: Secondary | ICD-10-CM | POA: Diagnosis not present

## 2020-10-21 DIAGNOSIS — M7122 Synovial cyst of popliteal space [Baker], left knee: Secondary | ICD-10-CM | POA: Insufficient documentation

## 2020-10-21 DIAGNOSIS — M79605 Pain in left leg: Secondary | ICD-10-CM | POA: Diagnosis not present

## 2020-10-21 DIAGNOSIS — M7989 Other specified soft tissue disorders: Secondary | ICD-10-CM | POA: Diagnosis not present

## 2020-10-21 LAB — BASIC METABOLIC PANEL
Anion gap: 12 (ref 5–15)
BUN: 19 mg/dL (ref 8–23)
CO2: 28 mmol/L (ref 22–32)
Calcium: 9.3 mg/dL (ref 8.9–10.3)
Chloride: 93 mmol/L — ABNORMAL LOW (ref 98–111)
Creatinine, Ser: 0.79 mg/dL (ref 0.44–1.00)
GFR, Estimated: >60 mL/min (ref >60)
Glucose, Bld: 99 mg/dL (ref 70–99)
Potassium: 3.8 mmol/L (ref 3.5–5.1)
Sodium: 133 mmol/L — ABNORMAL LOW (ref 135–145)

## 2020-10-21 LAB — CBC
HCT: 36.1 % (ref 36.0–46.0)
Hemoglobin: 12 g/dL (ref 12.0–15.0)
MCH: 29.9 pg (ref 26.0–34.0)
MCHC: 33.2 g/dL (ref 30.0–36.0)
MCV: 89.8 fL (ref 80.0–100.0)
Platelets: 195 10*3/uL (ref 150–400)
RBC: 4.02 MIL/uL (ref 3.87–5.11)
RDW: 12.9 % (ref 11.5–15.5)
WBC: 4.6 10*3/uL (ref 4.0–10.5)
nRBC: 0 % (ref 0.0–0.2)

## 2020-10-21 NOTE — ED Triage Notes (Signed)
Pt to triage via wheelchair.  Pt from [place].  Pt has swelling and pain in left lower leg.  Pt had knee replacement surgery 4 weeks ago by dr Rudene Christians.  Leg red and swollen.  Incision no signs of infection.  Pt alert  Speech clear.

## 2020-10-22 ENCOUNTER — Emergency Department
Admission: EM | Admit: 2020-10-22 | Discharge: 2020-10-22 | Disposition: A | Discharge status: Home / Self Care | Payer: PPO | Attending: Emergency Medicine | Admitting: Emergency Medicine

## 2020-10-22 DIAGNOSIS — M7989 Other specified soft tissue disorders: Secondary | ICD-10-CM

## 2020-10-22 DIAGNOSIS — M79605 Pain in left leg: Secondary | ICD-10-CM

## 2020-10-22 DIAGNOSIS — M7122 Synovial cyst of popliteal space [Baker], left knee: Secondary | ICD-10-CM

## 2020-10-22 MED ORDER — METHOCARBAMOL 500 MG PO TABS
500.0000 mg | ORAL_TABLET | Freq: Once | ORAL | Status: AC
  Administered 2020-10-22: 500 mg via ORAL
  Filled 2020-10-22: qty 1

## 2020-10-22 MED ORDER — TRAMADOL HCL 50 MG PO TABS
50.0000 mg | ORAL_TABLET | Freq: Once | ORAL | Status: AC
Start: 2020-10-22 — End: 2020-10-22
  Administered 2020-10-22: 50 mg via ORAL
  Filled 2020-10-22: qty 1

## 2020-10-22 NOTE — ED Notes (Signed)
This RN spoke with staff at [facility] and advised that student was being released from ED and would be coming back. Pt spoke with family/friend on phone and advised they would be here to pick her up in about 30 minutes.

## 2020-10-22 NOTE — Discharge Instructions (Addendum)
You may remove Ace wrap to bathe and sleep. Elevate left leg whenever possible. Return to the ER for worsening symptoms, persistent vomiting, difficulty breathing or other concerns.

## 2020-10-22 NOTE — ED Provider Notes (Signed)
[facility] Emergency Department Provider Note   ____________________________________________   Event Date/Time   First MD Initiated Contact with Patient 10/22/20 0215     (approximate)  I have reviewed the triage vital signs and the nursing notes.   HISTORY  Chief Complaint Leg Swelling    HPI Lindsey [name] is a 85 y.o. female who presents to the ED from [place] with a chief complaint of left leg redness and swelling.  Patient had knee replacement 4 weeks ago by Dr. Rudene Christians.  Denies fever, cough, chest pain, shortness of breath, abdominal pain, nausea, vomiting or dizziness.     Past Medical History:  Diagnosis Date  . Anemia   . Breast cancer ([place])   . Bronchiectasis ([place]) 06/2007  . Cancer ([place])   . Colitis   . COPD (chronic obstructive pulmonary disease) ([place])   . Depression   . DVT (deep venous thrombosis) ([place])    after her right knee surgery.   . Fibromyalgia   . Fibromyalgia   . Hypertension   . Migraine headache with aura   . Osteoarthritis   . Osteoporosis   . PAD (peripheral artery disease) ([place])   . Pneumonia   . Polio 1952  . Stenosis of carotid artery     Patient Active Problem List   Diagnosis Date Noted  . S/P TKR (total knee replacement) using cement, bilateral 09/19/2020  . TIA (transient ischemic attack) 01/03/2020  . Migraine headache with aura 01/03/2020  . Essential hypertension 01/03/2020  . Depression 01/03/2020  . COPD with chronic bronchitis ([place]) 01/03/2020  . Hypokalemia 01/03/2020  . SOB (shortness of breath) 04/30/2014  . Leg swelling 04/30/2014  . Bronchiectasis without complication ([place]) [id number]  . History of DVT (deep vein thrombosis) 04/30/2014  . Labile blood pressure 04/30/2014  . S/P IVC filter 04/30/2014  . History of right knee surgery 04/30/2014    Past Surgical History:  Procedure Laterality Date  . ABDOMINAL HYSTERECTOMY    . BACK SURGERY    . BILATERAL TOTAL  MASTECTOMY WITH AXILLARY LYMPH NODE DISSECTION    . BREAST SURGERY    . CARPAL TUNNEL RELEASE     bilateral   . COLONOSCOPY WITH PROPOFOL N/A 08/02/2015   Procedure: COLONOSCOPY WITH PROPOFOL;  Surgeon: Manya Silvas, MD;  Location: [facility] END[facility]COPY;  Service: Endoscopy;  Laterality: N/A;  . FINGER TENDON REPAIR    . HERNIA REPAIR    . REPLACEMENT TOTAL KNEE     right knee   . SQUAMOUS CELL CARCINOMA EXCISION    . TONSILLECTOMY    . TOTAL KNEE ARTHROPLASTY Left 09/19/2020   Procedure: TOTAL KNEE ARTHROPLASTY;  Surgeon: Hessie Knows, MD;  Location: [facility] ORS;  Service: Orthopedics;  Laterality: Left;  . UMBILICAL HERNIA REPAIR    . VAGINAL HYSTERECTOMY      Prior to Admission medications   Medication Sig Start Date End Date Taking? Authorizing Provider  albuterol (PROVENTIL HFA;VENTOLIN HFA) 108 (90 Base) MCG/ACT inhaler Inhale 2 puffs into the lungs every 6 (six) hours as needed for wheezing or shortness of breath.     [provider]  ARIPiprazole (ABILIFY) 2 MG tablet Take 2 mg by mouth daily.     [provider]  budesonide-formoterol (SYMBICORT) 160-4.5 MCG/ACT inhaler Inhale 2 puffs into the lungs in the morning and at bedtime. 04/15/20 04/15/21  [provider]  doxazosin (CARDURA) 2 MG tablet Take 1 tablet (2 mg total) by mouth 2 (two) times  daily. 07/16/20   Minna Merritts, MD  enoxaparin (LOVENOX) 40 MG/0.4ML injection Inject 0.4 mLs (40 mg total) into the skin daily for 14 days. 09/20/20 10/04/20  Duanne Guess, PA-C  ezetimibe (ZETIA) 10 MG tablet Take 1 tablet (10 mg total) by mouth daily. 07/16/20   Gollan, Kathlene November, MD  Galcanezumab-gnlm (EMGALITY) 120 MG/ML SOAJ Inject 120 mg into the skin every 30 (thirty) days. 06/27/20   [provider]  hydrALAZINE (APRESOLINE) 25 MG tablet Take 1 tablet (25 mg total) by mouth as needed (for BP >170). 07/16/20   Minna Merritts, MD  hydrochlorothiazide (HYDRODIURIL) 25 MG tablet Take 1 tablet  (25 mg total) by mouth daily. 07/16/20   Minna Merritts, MD  HYDROcodone-acetaminophen (NORCO/VICODIN) 5-325 MG tablet Take 1-2 tablets by mouth every 4 (four) hours as needed for moderate pain (pain score 4-6). 09/20/20   Duanne Guess, PA-C  hydrOXYzine (ATARAX/VISTARIL) 25 MG tablet Take 25 mg by mouth as needed for anxiety.    [provider]  methocarbamol (ROBAXIN) 500 MG tablet Take 1 tablet (500 mg total) by mouth every 6 (six) hours as needed for muscle spasms. 09/20/20   Duanne Guess, PA-C  NON FORMULARY Take 2 capsules by mouth in the morning and at bedtime. Algaecal    [provider]  NON FORMULARY Take 2 capsules by mouth daily. Strontium Boost    [provider]  Omega-3 Fatty Acids (FISH OIL) 1000 MG CAPS Take 2,000 mg by mouth daily.     [provider]  OVER THE COUNTER MEDICATION Take 1 tablet by mouth daily. Adren-ALL Patient not taking: Reported on 09/13/2020    [provider]  polyethylene glycol (MIRALAX / GLYCOLAX) packet Take 17 g by mouth daily as needed for mild constipation or moderate constipation.     [provider]  potassium chloride (KLOR-CON M10) 10 MEQ tablet Take 1 tablet (10 mEq total) by mouth daily. 07/16/20   Minna Merritts, MD  rizatriptan (MAXALT) 5 MG tablet Take 5 mg by mouth as needed for migraine.  01/16/20   [provider]  telmisartan (MICARDIS) 80 MG tablet Take 1 tablet (80 mg total) by mouth daily. 08/14/20   Loel Dubonnet, NP  traMADol (ULTRAM) 50 MG tablet Take 1 tablet (50 mg total) by mouth every 6 (six) hours as needed. 09/20/20   Duanne Guess, PA-C  venlafaxine XR (EFFEXOR-XR) 75 MG 24 hr capsule Take 75 mg by mouth daily with breakfast.    [provider]    Allergies Ivp dye [iodinated diagnostic agents], Codeine, Sulfa antibiotics, Tegaderm ag mesh [silver], and Tetracyclines & related  Family History  Problem Relation Age of Onset  . Atrial  fibrillation Sister     Social History Social History   Tobacco Use  . Smoking status: Never Smoker  . Smokeless tobacco: Never Used  Vaping Use  . Vaping Use: Never used  Substance Use Topics  . Alcohol use: Yes    Alcohol/week: 1.0 standard drink    Types: 1 Glasses of wine per week    Comment: 3 glasses per week  . Drug use: No    Review of Systems  Constitutional: No fever/chills Eyes: No visual changes. ENT: No sore throat. Cardiovascular: Denies chest pain. Respiratory: Denies shortness of breath. Gastrointestinal: No abdominal pain.  No nausea, no vomiting.  No diarrhea.  No constipation. Genitourinary: Negative for dysuria. Musculoskeletal: Positive for left knee redness and swelling.  Negative for back pain. Skin: Negative for rash. Neurological: Negative for headaches, focal weakness or numbness.   ____________________________________________   PHYSICAL EXAM:  VITAL SIGNS: ED Triage Vitals  Enc Vitals Group     BP 10/21/20 1951 (!) 159/86     Pulse Rate 10/21/20 1951 74     Resp 10/21/20 1951 18     Temp 10/21/20 1951 98.8 F (37.1 C)     Temp Source 10/21/20 1951 Oral     SpO2 10/21/20 1951 97 %     Weight 10/21/20 1950 125 lb (56.7 kg)     Height 10/21/20 1950 5\' 2"  (1.575 m)     Head Circumference --      Peak Flow --      Pain Score 10/21/20 2003 6     Pain Loc --      Pain Edu? --      Excl. in [place]? --     Constitutional: Alert and oriented. Well appearing and in no acute distress. Eyes: Conjunctivae are normal. PERRL. EOMI. Head: Atraumatic. Nose: No congestion/rhinnorhea. Mouth/Throat: Mucous membranes are moist.   Neck: No stridor.   Cardiovascular: Normal rate, regular rhythm. Grossly normal heart sounds.  Good peripheral circulation. Respiratory: Normal respiratory effort.  No retractions. Lungs CTAB. Gastrointestinal: Soft and nontender. No distention. No abdominal bruits. No CVA tenderness. Musculoskeletal:  Left knee incision  C/D/I.  Mildly tender to posterior knee with mild effusion noted.  No warmth/erythema to suggest cellulitis or septic joint. Full range of motion.  1+ pitting edema.  Negative Homans' sign.  Symmetrically warm limb.  2+ distal pulses.  Brisk, less than 5-second capillary refill. Neurologic:  Normal speech and language. No gross focal neurologic deficits are appreciated. No gait instability using rolling walker. Skin:  Skin is warm, dry and intact. No rash noted. Psychiatric: Mood and affect are normal. Speech and behavior are normal.  ____________________________________________   LABS (all labs ordered are listed, but only abnormal results are displayed)  Labs Reviewed  BASIC METABOLIC PANEL - Abnormal; Notable for the following components:      Result Value   Sodium 133 (*)    Chloride 93 (*)    All other components within normal limits  CBC   ____________________________________________  EKG  None ____________________________________________  RADIOLOGY I, Samantha Olivera J, personally viewed and evaluated these images (plain radiographs) as part of my medical decision making, as well as reviewing the written report by the radiologist.  ED MD interpretation: Complex left popliteal fossa  Official radiology report(s): US Venous Img Lower Unilateral Left  Result Date: 10/21/2020 CLINICAL DATA:  Status knee replacement, swelling and painful lower extremity EXAM: Left LOWER EXTREMITY VENOUS DOPPLER ULTRASOUND TECHNIQUE: Gray-scale sonography with compression, as well as color and duplex ultrasound, were performed to evaluate the deep venous system(s) from the level of the common femoral vein through the popliteal and proximal calf veins. COMPARISON:  09/21/2020 FINDINGS: VENOUS Normal compressibility of the common femoral, superficial femoral, and popliteal veins, as well as the visualized calf veins. Visualized portions of profunda femoral vein and great saphenous vein unremarkable. No  filling defects to suggest DVT on grayscale or color Doppler imaging. Doppler waveforms show normal direction of venous flow, normal respiratory plasticity and response to augmentation. Limited views of the contralateral common femoral vein are unremarkable. OTHER Complex cysts within the left popliteal fossa measuring 6.4 x 1.8 x 4.2 cm, previously 6 x 1.5 x 4.4 cm. Limitations: none IMPRESSION: 1. Negative for acute DVT of  the left lower extremity 2. Complex left popliteal fossa cyst Electronically Signed   By: Donavan Foil M.D.   On: 10/21/2020 21:36    ____________________________________________   PROCEDURES  Procedure(s) performed (including Critical Care):  Procedures   ____________________________________________   INITIAL IMPRESSION / ASSESSMENT AND PLAN / ED COURSE  As part of my medical decision making, I reviewed the following data within the [place] notes reviewed and incorporated, Labs reviewed, Old chart reviewed, Radiograph reviewed and Notes from prior ED visits     85 year old female presenting with left lower leg redness and swelling approximately 4 weeks status post knee replacement.  Differential diagnosis includes but is not limited to postoperative infection, DVT, peripheral edema, Baker's cyst, etc.  Laboratory results unremarkable.  Ultrasound negative for DVT, complex left popliteal cyst noted.  Will provide Ace wrap.  Patient is overdue for her Tramadol and Robaxin due to being in the ED.  Strict return precautions given.  Patient verbalizes understanding agrees with plan of care.     ____________________________________________   FINAL CLINICAL IMPRESSION(S) / ED DIAGNOSES  Final diagnoses:  Synovial cyst of left popliteal space  Left leg swelling  Left leg pain     ED Discharge Orders    None      *Please note:  Lindsey [name] was evaluated in Emergency Department on 10/22/2020 for the symptoms described in the  history of present illness. She was evaluated in the context of the global COVID-19 pandemic, which necessitated consideration that the patient might be at risk for infection with the SARS-CoV-2 virus that causes COVID-19. Institutional protocols and algorithms that pertain to the evaluation of patients at risk for COVID-19 are in a state of rapid change based on information released by regulatory bodies including the CDC and federal and state organizations. These policies and algorithms were followed during the patient's care in the ED.  Some ED evaluations and interventions may be delayed as a result of limited staffing during and the pandemic.*   Note:  This document was prepared using Dragon voice recognition software and may include unintentional dictation errors.   Paulette Blanch, MD 10/22/20 [phone]

## 2020-10-24 DIAGNOSIS — M25562 Pain in left knee: Secondary | ICD-10-CM | POA: Diagnosis not present

## 2020-10-24 DIAGNOSIS — Z96653 Presence of artificial knee joint, bilateral: Secondary | ICD-10-CM | POA: Diagnosis not present

## 2020-10-24 DIAGNOSIS — Z471 Aftercare following joint replacement surgery: Secondary | ICD-10-CM | POA: Diagnosis not present

## 2020-10-24 DIAGNOSIS — M6281 Muscle weakness (generalized): Secondary | ICD-10-CM | POA: Diagnosis not present

## 2020-10-24 DIAGNOSIS — R2689 Other abnormalities of gait and mobility: Secondary | ICD-10-CM | POA: Diagnosis not present

## 2020-10-25 DIAGNOSIS — Z471 Aftercare following joint replacement surgery: Secondary | ICD-10-CM | POA: Diagnosis not present

## 2020-10-25 DIAGNOSIS — M6281 Muscle weakness (generalized): Secondary | ICD-10-CM | POA: Diagnosis not present

## 2020-10-25 DIAGNOSIS — R262 Difficulty in walking, not elsewhere classified: Secondary | ICD-10-CM | POA: Diagnosis not present

## 2020-10-25 DIAGNOSIS — R2689 Other abnormalities of gait and mobility: Secondary | ICD-10-CM | POA: Diagnosis not present

## 2020-10-25 DIAGNOSIS — Z9181 History of falling: Secondary | ICD-10-CM | POA: Diagnosis not present

## 2020-10-29 DIAGNOSIS — M6281 Muscle weakness (generalized): Secondary | ICD-10-CM | POA: Diagnosis not present

## 2020-10-29 DIAGNOSIS — M25562 Pain in left knee: Secondary | ICD-10-CM | POA: Diagnosis not present

## 2020-10-29 DIAGNOSIS — Z471 Aftercare following joint replacement surgery: Secondary | ICD-10-CM | POA: Diagnosis not present

## 2020-10-29 DIAGNOSIS — R262 Difficulty in walking, not elsewhere classified: Secondary | ICD-10-CM | POA: Diagnosis not present

## 2020-10-29 DIAGNOSIS — Z9181 History of falling: Secondary | ICD-10-CM | POA: Diagnosis not present

## 2020-10-29 DIAGNOSIS — R2689 Other abnormalities of gait and mobility: Secondary | ICD-10-CM | POA: Diagnosis not present

## 2020-10-29 DIAGNOSIS — Z96653 Presence of artificial knee joint, bilateral: Secondary | ICD-10-CM | POA: Diagnosis not present

## 2020-11-01 DIAGNOSIS — M7062 Trochanteric bursitis, left hip: Secondary | ICD-10-CM | POA: Diagnosis not present

## 2020-11-01 DIAGNOSIS — R262 Difficulty in walking, not elsewhere classified: Secondary | ICD-10-CM | POA: Diagnosis not present

## 2020-11-01 DIAGNOSIS — M1712 Unilateral primary osteoarthritis, left knee: Secondary | ICD-10-CM | POA: Diagnosis not present

## 2020-11-01 DIAGNOSIS — Z96652 Presence of left artificial knee joint: Secondary | ICD-10-CM | POA: Diagnosis not present

## 2020-11-01 DIAGNOSIS — Z9181 History of falling: Secondary | ICD-10-CM | POA: Diagnosis not present

## 2020-11-01 DIAGNOSIS — R2689 Other abnormalities of gait and mobility: Secondary | ICD-10-CM | POA: Diagnosis not present

## 2020-11-01 DIAGNOSIS — Z471 Aftercare following joint replacement surgery: Secondary | ICD-10-CM | POA: Diagnosis not present

## 2020-11-01 DIAGNOSIS — M6281 Muscle weakness (generalized): Secondary | ICD-10-CM | POA: Diagnosis not present

## 2020-11-05 DIAGNOSIS — Z9181 History of falling: Secondary | ICD-10-CM | POA: Diagnosis not present

## 2020-11-05 DIAGNOSIS — R262 Difficulty in walking, not elsewhere classified: Secondary | ICD-10-CM | POA: Diagnosis not present

## 2020-11-05 DIAGNOSIS — M6281 Muscle weakness (generalized): Secondary | ICD-10-CM | POA: Diagnosis not present

## 2020-11-05 DIAGNOSIS — R2689 Other abnormalities of gait and mobility: Secondary | ICD-10-CM | POA: Diagnosis not present

## 2020-11-05 DIAGNOSIS — Z471 Aftercare following joint replacement surgery: Secondary | ICD-10-CM | POA: Diagnosis not present

## 2020-11-05 DIAGNOSIS — M25562 Pain in left knee: Secondary | ICD-10-CM | POA: Diagnosis not present

## 2020-11-05 DIAGNOSIS — Z96653 Presence of artificial knee joint, bilateral: Secondary | ICD-10-CM | POA: Diagnosis not present

## 2020-11-07 DIAGNOSIS — R2689 Other abnormalities of gait and mobility: Secondary | ICD-10-CM | POA: Diagnosis not present

## 2020-11-07 DIAGNOSIS — Z96653 Presence of artificial knee joint, bilateral: Secondary | ICD-10-CM | POA: Diagnosis not present

## 2020-11-07 DIAGNOSIS — R262 Difficulty in walking, not elsewhere classified: Secondary | ICD-10-CM | POA: Diagnosis not present

## 2020-11-07 DIAGNOSIS — Z471 Aftercare following joint replacement surgery: Secondary | ICD-10-CM | POA: Diagnosis not present

## 2020-11-07 DIAGNOSIS — M25562 Pain in left knee: Secondary | ICD-10-CM | POA: Diagnosis not present

## 2020-11-07 DIAGNOSIS — Z9181 History of falling: Secondary | ICD-10-CM | POA: Diagnosis not present

## 2020-11-07 DIAGNOSIS — M6281 Muscle weakness (generalized): Secondary | ICD-10-CM | POA: Diagnosis not present

## 2020-11-11 DIAGNOSIS — H353211 Exudative age-related macular degeneration, right eye, with active choroidal neovascularization: Secondary | ICD-10-CM | POA: Diagnosis not present

## 2020-11-27 DIAGNOSIS — M76899 Other specified enthesopathies of unspecified lower limb, excluding foot: Secondary | ICD-10-CM | POA: Diagnosis not present

## 2020-11-27 DIAGNOSIS — Z96652 Presence of left artificial knee joint: Secondary | ICD-10-CM | POA: Diagnosis not present

## 2020-12-03 ENCOUNTER — Other Ambulatory Visit: Payer: Self-pay | Admitting: Orthopedic Surgery

## 2020-12-03 DIAGNOSIS — M76899 Other specified enthesopathies of unspecified lower limb, excluding foot: Secondary | ICD-10-CM

## 2020-12-04 DIAGNOSIS — D2261 Melanocytic nevi of right upper limb, including shoulder: Secondary | ICD-10-CM | POA: Diagnosis not present

## 2020-12-04 DIAGNOSIS — L821 Other seborrheic keratosis: Secondary | ICD-10-CM | POA: Diagnosis not present

## 2020-12-04 DIAGNOSIS — Z0000 Encounter for general adult medical examination without abnormal findings: Secondary | ICD-10-CM | POA: Diagnosis not present

## 2020-12-04 DIAGNOSIS — L720 Epidermal cyst: Secondary | ICD-10-CM | POA: Diagnosis not present

## 2020-12-04 DIAGNOSIS — D2262 Melanocytic nevi of left upper limb, including shoulder: Secondary | ICD-10-CM | POA: Diagnosis not present

## 2020-12-04 DIAGNOSIS — L570 Actinic keratosis: Secondary | ICD-10-CM | POA: Diagnosis not present

## 2020-12-04 DIAGNOSIS — I10 Essential (primary) hypertension: Secondary | ICD-10-CM | POA: Diagnosis not present

## 2020-12-04 DIAGNOSIS — R8290 Unspecified abnormal findings in urine: Secondary | ICD-10-CM | POA: Diagnosis not present

## 2020-12-04 DIAGNOSIS — Z85828 Personal history of other malignant neoplasm of skin: Secondary | ICD-10-CM | POA: Diagnosis not present

## 2020-12-04 DIAGNOSIS — F32A Depression, unspecified: Secondary | ICD-10-CM | POA: Diagnosis not present

## 2020-12-04 DIAGNOSIS — R0602 Shortness of breath: Secondary | ICD-10-CM | POA: Diagnosis not present

## 2020-12-04 DIAGNOSIS — E876 Hypokalemia: Secondary | ICD-10-CM | POA: Diagnosis not present

## 2020-12-04 DIAGNOSIS — X32XXXA Exposure to sunlight, initial encounter: Secondary | ICD-10-CM | POA: Diagnosis not present

## 2020-12-04 DIAGNOSIS — D225 Melanocytic nevi of trunk: Secondary | ICD-10-CM | POA: Diagnosis not present

## 2020-12-11 DIAGNOSIS — F3340 Major depressive disorder, recurrent, in remission, unspecified: Secondary | ICD-10-CM | POA: Diagnosis not present

## 2020-12-11 DIAGNOSIS — Z0000 Encounter for general adult medical examination without abnormal findings: Secondary | ICD-10-CM | POA: Diagnosis not present

## 2020-12-11 DIAGNOSIS — Z8669 Personal history of other diseases of the nervous system and sense organs: Secondary | ICD-10-CM | POA: Diagnosis not present

## 2020-12-11 DIAGNOSIS — K52832 Lymphocytic colitis: Secondary | ICD-10-CM | POA: Diagnosis not present

## 2020-12-11 DIAGNOSIS — I10 Essential (primary) hypertension: Secondary | ICD-10-CM | POA: Diagnosis not present

## 2020-12-11 DIAGNOSIS — M1990 Unspecified osteoarthritis, unspecified site: Secondary | ICD-10-CM | POA: Diagnosis not present

## 2020-12-11 DIAGNOSIS — Z96652 Presence of left artificial knee joint: Secondary | ICD-10-CM | POA: Diagnosis not present

## 2020-12-11 DIAGNOSIS — M25552 Pain in left hip: Secondary | ICD-10-CM | POA: Diagnosis not present

## 2020-12-11 DIAGNOSIS — G8929 Other chronic pain: Secondary | ICD-10-CM | POA: Diagnosis not present

## 2020-12-11 DIAGNOSIS — J449 Chronic obstructive pulmonary disease, unspecified: Secondary | ICD-10-CM | POA: Diagnosis not present

## 2020-12-15 ENCOUNTER — Other Ambulatory Visit: Payer: Self-pay

## 2020-12-15 ENCOUNTER — Ambulatory Visit
Admission: RE | Admit: 2020-12-15 | Discharge: 2020-12-15 | Disposition: A | Discharge status: Home / Self Care | Payer: PPO | Source: Ambulatory Visit | Attending: Orthopedic Surgery | Admitting: Orthopedic Surgery

## 2020-12-15 DIAGNOSIS — M76899 Other specified enthesopathies of unspecified lower limb, excluding foot: Secondary | ICD-10-CM | POA: Insufficient documentation

## 2020-12-15 DIAGNOSIS — M8548 Solitary bone cyst, other site: Secondary | ICD-10-CM | POA: Diagnosis not present

## 2020-12-15 DIAGNOSIS — S76012A Strain of muscle, fascia and tendon of left hip, initial encounter: Secondary | ICD-10-CM | POA: Diagnosis not present

## 2021-01-06 DIAGNOSIS — H353211 Exudative age-related macular degeneration, right eye, with active choroidal neovascularization: Secondary | ICD-10-CM | POA: Diagnosis not present

## 2021-01-08 DIAGNOSIS — R899 Unspecified abnormal finding in specimens from other organs, systems and tissues: Secondary | ICD-10-CM | POA: Diagnosis not present

## 2021-01-10 DIAGNOSIS — R899 Unspecified abnormal finding in specimens from other organs, systems and tissues: Secondary | ICD-10-CM | POA: Diagnosis not present

## 2021-01-15 ENCOUNTER — Other Ambulatory Visit: Payer: Self-pay | Admitting: Physician Assistant

## 2021-01-15 DIAGNOSIS — Z79899 Other long term (current) drug therapy: Secondary | ICD-10-CM

## 2021-01-15 DIAGNOSIS — I10 Essential (primary) hypertension: Secondary | ICD-10-CM

## 2021-01-23 DIAGNOSIS — M25552 Pain in left hip: Secondary | ICD-10-CM | POA: Diagnosis not present

## 2021-01-23 DIAGNOSIS — R2689 Other abnormalities of gait and mobility: Secondary | ICD-10-CM | POA: Diagnosis not present

## 2021-01-23 DIAGNOSIS — R2681 Unsteadiness on feet: Secondary | ICD-10-CM | POA: Diagnosis not present

## 2021-01-23 DIAGNOSIS — M6281 Muscle weakness (generalized): Secondary | ICD-10-CM | POA: Diagnosis not present

## 2021-01-23 DIAGNOSIS — M7072 Other bursitis of hip, left hip: Secondary | ICD-10-CM | POA: Diagnosis not present

## 2021-01-27 DIAGNOSIS — M6281 Muscle weakness (generalized): Secondary | ICD-10-CM | POA: Diagnosis not present

## 2021-01-27 DIAGNOSIS — M7072 Other bursitis of hip, left hip: Secondary | ICD-10-CM | POA: Diagnosis not present

## 2021-01-27 DIAGNOSIS — R2681 Unsteadiness on feet: Secondary | ICD-10-CM | POA: Diagnosis not present

## 2021-01-27 DIAGNOSIS — R2689 Other abnormalities of gait and mobility: Secondary | ICD-10-CM | POA: Diagnosis not present

## 2021-01-27 DIAGNOSIS — M25552 Pain in left hip: Secondary | ICD-10-CM | POA: Diagnosis not present

## 2021-01-29 DIAGNOSIS — R2681 Unsteadiness on feet: Secondary | ICD-10-CM | POA: Diagnosis not present

## 2021-01-29 DIAGNOSIS — R2689 Other abnormalities of gait and mobility: Secondary | ICD-10-CM | POA: Diagnosis not present

## 2021-01-29 DIAGNOSIS — M25552 Pain in left hip: Secondary | ICD-10-CM | POA: Diagnosis not present

## 2021-01-29 DIAGNOSIS — M7072 Other bursitis of hip, left hip: Secondary | ICD-10-CM | POA: Diagnosis not present

## 2021-01-29 DIAGNOSIS — M6281 Muscle weakness (generalized): Secondary | ICD-10-CM | POA: Diagnosis not present

## 2021-02-03 DIAGNOSIS — R2689 Other abnormalities of gait and mobility: Secondary | ICD-10-CM | POA: Diagnosis not present

## 2021-02-03 DIAGNOSIS — M7072 Other bursitis of hip, left hip: Secondary | ICD-10-CM | POA: Diagnosis not present

## 2021-02-03 DIAGNOSIS — R2681 Unsteadiness on feet: Secondary | ICD-10-CM | POA: Diagnosis not present

## 2021-02-03 DIAGNOSIS — M25552 Pain in left hip: Secondary | ICD-10-CM | POA: Diagnosis not present

## 2021-02-03 DIAGNOSIS — M6281 Muscle weakness (generalized): Secondary | ICD-10-CM | POA: Diagnosis not present

## 2021-02-05 DIAGNOSIS — M6281 Muscle weakness (generalized): Secondary | ICD-10-CM | POA: Diagnosis not present

## 2021-02-05 DIAGNOSIS — M7072 Other bursitis of hip, left hip: Secondary | ICD-10-CM | POA: Diagnosis not present

## 2021-02-05 DIAGNOSIS — R2689 Other abnormalities of gait and mobility: Secondary | ICD-10-CM | POA: Diagnosis not present

## 2021-02-05 DIAGNOSIS — M25552 Pain in left hip: Secondary | ICD-10-CM | POA: Diagnosis not present

## 2021-02-05 DIAGNOSIS — R2681 Unsteadiness on feet: Secondary | ICD-10-CM | POA: Diagnosis not present

## 2021-03-21 DIAGNOSIS — H353211 Exudative age-related macular degeneration, right eye, with active choroidal neovascularization: Secondary | ICD-10-CM | POA: Diagnosis not present

## 2021-03-31 ENCOUNTER — Other Ambulatory Visit: Payer: Self-pay | Admitting: Family

## 2021-04-01 NOTE — Telephone Encounter (Signed)
Rx(s) sent to pharmacy electronically.  

## 2021-04-03 DIAGNOSIS — H903 Sensorineural hearing loss, bilateral: Secondary | ICD-10-CM | POA: Diagnosis not present

## 2021-04-09 DIAGNOSIS — K5904 Chronic idiopathic constipation: Secondary | ICD-10-CM | POA: Diagnosis not present

## 2021-04-21 DIAGNOSIS — Z96652 Presence of left artificial knee joint: Secondary | ICD-10-CM | POA: Diagnosis not present

## 2021-04-21 DIAGNOSIS — M1990 Unspecified osteoarthritis, unspecified site: Secondary | ICD-10-CM | POA: Diagnosis not present

## 2021-04-21 DIAGNOSIS — G8929 Other chronic pain: Secondary | ICD-10-CM | POA: Diagnosis not present

## 2021-04-21 DIAGNOSIS — M25552 Pain in left hip: Secondary | ICD-10-CM | POA: Diagnosis not present

## 2021-04-21 DIAGNOSIS — I10 Essential (primary) hypertension: Secondary | ICD-10-CM | POA: Diagnosis not present

## 2021-04-21 DIAGNOSIS — J449 Chronic obstructive pulmonary disease, unspecified: Secondary | ICD-10-CM | POA: Diagnosis not present

## 2021-04-21 DIAGNOSIS — Z8669 Personal history of other diseases of the nervous system and sense organs: Secondary | ICD-10-CM | POA: Diagnosis not present

## 2021-04-28 DIAGNOSIS — J479 Bronchiectasis, uncomplicated: Secondary | ICD-10-CM | POA: Diagnosis not present

## 2021-04-28 DIAGNOSIS — J309 Allergic rhinitis, unspecified: Secondary | ICD-10-CM | POA: Diagnosis not present

## 2021-04-28 DIAGNOSIS — I10 Essential (primary) hypertension: Secondary | ICD-10-CM | POA: Diagnosis not present

## 2021-04-28 DIAGNOSIS — F3341 Major depressive disorder, recurrent, in partial remission: Secondary | ICD-10-CM | POA: Diagnosis not present

## 2021-04-28 DIAGNOSIS — M1990 Unspecified osteoarthritis, unspecified site: Secondary | ICD-10-CM | POA: Diagnosis not present

## 2021-04-28 DIAGNOSIS — Z0000 Encounter for general adult medical examination without abnormal findings: Secondary | ICD-10-CM | POA: Diagnosis not present

## 2021-04-28 DIAGNOSIS — M797 Fibromyalgia: Secondary | ICD-10-CM | POA: Diagnosis not present

## 2021-04-28 DIAGNOSIS — Z8616 Personal history of COVID-19: Secondary | ICD-10-CM | POA: Diagnosis not present

## 2021-04-28 DIAGNOSIS — I8393 Asymptomatic varicose veins of bilateral lower extremities: Secondary | ICD-10-CM | POA: Diagnosis not present

## 2021-04-28 DIAGNOSIS — J449 Chronic obstructive pulmonary disease, unspecified: Secondary | ICD-10-CM | POA: Diagnosis not present

## 2021-04-28 DIAGNOSIS — Z8669 Personal history of other diseases of the nervous system and sense organs: Secondary | ICD-10-CM | POA: Diagnosis not present

## 2021-04-28 DIAGNOSIS — M7061 Trochanteric bursitis, right hip: Secondary | ICD-10-CM | POA: Diagnosis not present

## 2021-05-01 DIAGNOSIS — Z961 Presence of intraocular lens: Secondary | ICD-10-CM | POA: Diagnosis not present

## 2021-05-05 DIAGNOSIS — H353211 Exudative age-related macular degeneration, right eye, with active choroidal neovascularization: Secondary | ICD-10-CM | POA: Diagnosis not present

## 2021-05-20 DIAGNOSIS — G43119 Migraine with aura, intractable, without status migrainosus: Secondary | ICD-10-CM | POA: Diagnosis not present

## 2021-05-20 DIAGNOSIS — W19XXXA Unspecified fall, initial encounter: Secondary | ICD-10-CM | POA: Diagnosis not present

## 2021-05-20 DIAGNOSIS — R4189 Other symptoms and signs involving cognitive functions and awareness: Secondary | ICD-10-CM | POA: Diagnosis not present

## 2021-05-20 DIAGNOSIS — H9012 Conductive hearing loss, unilateral, left ear, with unrestricted hearing on the contralateral side: Secondary | ICD-10-CM | POA: Diagnosis not present

## 2021-05-22 ENCOUNTER — Other Ambulatory Visit: Payer: Self-pay | Admitting: Neurology

## 2021-05-22 DIAGNOSIS — G43119 Migraine with aura, intractable, without status migrainosus: Secondary | ICD-10-CM

## 2021-05-30 ENCOUNTER — Ambulatory Visit
Admission: RE | Admit: 2021-05-30 | Discharge: 2021-05-30 | Disposition: A | Discharge status: Home / Self Care | Payer: PPO | Source: Ambulatory Visit | Attending: Neurology | Admitting: Neurology

## 2021-05-30 ENCOUNTER — Other Ambulatory Visit: Payer: Self-pay

## 2021-05-30 DIAGNOSIS — G43119 Migraine with aura, intractable, without status migrainosus: Secondary | ICD-10-CM | POA: Diagnosis not present

## 2021-05-30 DIAGNOSIS — R519 Headache, unspecified: Secondary | ICD-10-CM | POA: Diagnosis not present

## 2021-05-30 DIAGNOSIS — R42 Dizziness and giddiness: Secondary | ICD-10-CM | POA: Diagnosis not present

## 2021-06-06 ENCOUNTER — Ambulatory Visit: Payer: PPO

## 2021-06-13 DIAGNOSIS — L821 Other seborrheic keratosis: Secondary | ICD-10-CM | POA: Diagnosis not present

## 2021-06-13 DIAGNOSIS — L728 Other follicular cysts of the skin and subcutaneous tissue: Secondary | ICD-10-CM | POA: Diagnosis not present

## 2021-06-13 DIAGNOSIS — L820 Inflamed seborrheic keratosis: Secondary | ICD-10-CM | POA: Diagnosis not present

## 2021-06-16 DIAGNOSIS — H353211 Exudative age-related macular degeneration, right eye, with active choroidal neovascularization: Secondary | ICD-10-CM | POA: Diagnosis not present

## 2021-07-02 ENCOUNTER — Ambulatory Visit: Payer: PPO | Attending: Neurology

## 2021-07-02 DIAGNOSIS — Z9181 History of falling: Secondary | ICD-10-CM | POA: Diagnosis not present

## 2021-07-02 DIAGNOSIS — M6281 Muscle weakness (generalized): Secondary | ICD-10-CM

## 2021-07-02 NOTE — Patient Instructions (Signed)
   Sitting on your bed (hips less than 90 degrees flexion)   Belt around thighs, which are shoulder width apart,    Press knees out against belt with 40 % effort for 1 minute   Rest for 1-2 minutes   Perform 5 times per set.     Do 3 sets per day. Each set to be performed a few hours apart.

## 2021-07-02 NOTE — Therapy (Signed)
[facility] [address], [place], [address] Phone: [phone]   Fax:  [phone]  Physical Therapy Evaluation  Patient Details  Name: Lindsey [name] MRN: [id number] Date of Birth: 01/25/1936 Referring Provider (PT): Vladimir Crofts, MD  Encounter Date: 07/02/2021   PT End of Session - 07/02/21 1431     Visit Number 1    Number of Visits 17    Date for PT Re-Evaluation 08/28/21    Authorization Type 1    Authorization Time Period 10    PT Start Time 1431    PT Stop Time 1543    PT Time Calculation (min) 72 min    Equipment Utilized During Treatment Gait belt    Activity Tolerance Patient tolerated treatment well    Behavior During Therapy [facility] for tasks assessed/performed             Past Medical History:  Diagnosis Date   Anemia    Breast cancer ([facility])    Bronchiectasis ([place]) 06/2007   Cancer ([place])    Colitis    COPD (chronic obstructive pulmonary disease) ([place])    Depression    DVT (deep venous thrombosis) ([place])    after her right knee surgery.    Fibromyalgia    Fibromyalgia    Hypertension    Migraine headache with aura    Osteoarthritis    Osteoporosis    PAD (peripheral artery disease) ([place])    Pneumonia    Polio 1952   Stenosis of carotid artery     Past Surgical History:  Procedure Laterality Date   ABDOMINAL HYSTERECTOMY     BACK SURGERY     BILATERAL TOTAL MASTECTOMY WITH AXILLARY LYMPH NODE DISSECTION     BREAST SURGERY     CARPAL TUNNEL RELEASE     bilateral    COLONOSCOPY WITH PROPOFOL N/A 08/02/2015   Procedure: COLONOSCOPY WITH PROPOFOL;  Surgeon: Manya Silvas, MD;  Location: [facility] ENDOSCOPY;  Service: Endoscopy;  Laterality: N/A;   FINGER TENDON REPAIR     HERNIA REPAIR     REPLACEMENT TOTAL KNEE     right knee    SQUAMOUS CELL CARCINOMA EXCISION     TONSILLECTOMY     TOTAL KNEE ARTHROPLASTY Left 09/19/2020   Procedure: TOTAL KNEE ARTHROPLASTY;  Surgeon:  Hessie Knows, MD;  Location: [facility] ORS;  Service: Orthopedics;  Laterality: Left;   UMBILICAL HERNIA REPAIR     VAGINAL HYSTERECTOMY      There were no vitals filed for this visit.    Subjective Assessment - 07/02/21 1441     Subjective L masseter muscle/jaw pain.    Pertinent History Falls, balance. Had L TKA 09/2020. Has not been as steady as she would like since then. Also went through L knee PT at the [facility] (pt place of residence). Pt fell May 01, 2021 (2 months ago). Pt was on her way into the eye center. There was an island (mini hump shape)  in the parking lot and pt walked onto [employer] (pt was walking up the mini hump)  and fell backwards to her L side and hit her L elbow, knee, and L head above her ear onto the pavement. Did not break anything. Had an MRI for her head 3 weeks later which was negative. Pt currently has L masseter muscle pain since the fall (pain when opening her mouth wide. Has not had a fall in 3 years except  for this one.  Does not feel as strong as she needs to be and feels it when she tries to walk any distance. Gets tired walking beyond her mailbox (60 ft away) and then her legs feel heavy. Sometimes uses a rollator. Has a long walk to get to dinner and therefore uses her car.    Patient Stated Goals Get stronger upper body and lower body.    Currently in Pain? Other (Comment)   L jaw.               [facility] PT Assessment - 07/02/21 1437       Assessment   Medical Diagnosis M62.81 (ICD-10-CM) - Muscle weakness (generalized)  W19.[place] Abbe (ICD-10-CM) - Unspecified fall, initial encounter    Referring Provider (PT) Vladimir Crofts, MD    Onset Date/Surgical Date 06/05/21    Hand Dominance Right      Precautions   Precaution Comments Fall risk      Restrictions   Other Position/Activity Restrictions No known restrictions      Balance Screen   Has the patient fallen in the past 6 months Yes    How many times? 1    Has the patient had a  decrease in activity level because of a fear of falling?  No   But very tentative   Is the patient reluctant to leave their home because of a fear of falling?  No      Home Environment   Additional Comments Lives in the [facility] (assisted living)      Prior Function   Vocation Retired   Retired Astronomer     Observation/Other Assessments   Observations 6 minute walk 879 ft SBA    Focus on Therapeutic Outcomes (FOTO)  Upper leg FOTO 47      Posture/Postural Control   Posture Comments protracted neck, B protracted shoulders, L lumbar convexity      AROM   Lumbar Flexion full    Lumbar Extension WFL    Lumbar - Right Side Bend WFL    Lumbar - Left Side Bend WFL    Lumbar - Right Rotation WFL    Lumbar - Left Rotation [facility]      Strength   Right Hip Flexion 4-/5    Right Hip Extension 4/5    Right Hip ABduction 4/5    Left Hip Flexion 4-/5    Left Hip Extension 4-/5    Left Hip ABduction 4/5    Right Knee Flexion 4+/5    Right Knee Extension 5/5    Left Knee Flexion 4/5   L knee joint pain   Left Knee Extension 5/5      Palpation   Palpation comment TTP L greater trochanter      Ambulation/Gait   Gait Comments Gait: slight antalgic, decreased stance L LE, bilateral femoral adduction and IR. Shaky legs.      Dynamic Gait Index   Level Surface Normal    Change in Gait Speed Normal    Gait with Horizontal Head Turns Mild Impairment   R gait deviation with R cervical rotation   Gait with Vertical Head Turns Mild Impairment   slight unsteadiness when looking up.   Gait and Pivot Turn Mild Impairment    Step Over Obstacle Mild Impairment    Step Around Obstacles Normal    Steps Mild Impairment    Total Score 19    DGI comment: < 19 suggests increased risk for falls.  Objective measurements completed on examination: See above findings.    No latex allergies Blood pressure is labile but pills control it.   Blood  pressure, L arm sitting, mechanically taken: 130/75, HR 74   Had MILD procedure in low back 2 years ago, no laminectomy, no fusion.   Sit <> stand: B genu vaigus/decreased femoral control L > R   TTP L greater trochanter  Therapeutic exercise  Seated clamshell isometrics 40% effort 1 min with 1 min rest x 4  Reviewed and given as part of her HEP. Pt demonstrated and verbalized understanding. Handout provided.   Improved exercise technique, movement at target joints, use of target muscles after mod verbal, visual, tactile cues.    Response to treatment Pt tolerated session well without aggravation of symptoms.    Clinical impression Pt is an 85 year old female who came to physical therapy secondary to hx of fall and muscle weakness. She also demonstrates TTP L greater trochanter, bilateral hip weakness, decreased femoral control, difficulty ambulating longer distances secondary to fatigue and min risk for falls. Pt will benefit from skilled physical therapy services to address the aforementioned deficits.          PT Education - 07/02/21 1622     Education Details ther-ex, HEP, POC    Person(s) Educated Patient    Methods Demonstration;Explanation;Tactile cues;Verbal cues;Handout    Comprehension Verbalized understanding;Returned demonstration              PT Short Term Goals - 07/02/21 1559       PT SHORT TERM GOAL #1   Title Pt will be independent with her initial HEP to improve strengh, balance and decrease fall risk.    Baseline Pt has started her HEP. (07/02/2021)    Time 3    Period Weeks    Status New    Target Date 07/24/21               PT Long Term Goals - 07/02/21 1602       PT LONG TERM GOAL #1   Title Pt will improve her DGI score by at least 3 points as a demonstration of decreased fall risk.    Baseline DGI score 19 (07/02/2021)    Time 8    Period Weeks    Status New    Target Date 08/28/21      PT LONG TERM GOAL #2   Title Pt  will improve bilateral hip extension and abduction strength to promote ability to negotiate obstacles with less difficulty.    Baseline Hip extension 4/5 R, 4-/5 L, Hip abduction 4/5 R, 4/5 L (07/02/2021)    Time 8    Period Weeks    Status New    Target Date 08/28/21      PT LONG TERM GOAL #3   Title Pt will improve her FOTO score by at least 10 points as a demonstration of improved function.    Baseline Upper leg FOTO 47 (07/02/2021)    Time 8    Period Weeks    Status New    Target Date 08/28/21      PT LONG TERM GOAL #4   Title Pt will improve her 6 minute walk distance to 1000 ft to promote mobility at her home.    Baseline 6 minute walk distace 879 ft (07/02/2021)    Time 8    Period Weeks    Status New    Target Date 08/28/21  Plan - 07/02/21 1555     Clinical Impression Statement Pt is an 85 year old female who came to physical therapy secondary to hx of fall and muscle weakness. She also demonstrates TTP L greater trochanter, bilateral hip weakness, decreased femoral control, difficulty ambulating longer distances secondary to fatigue and min risk for falls. Pt will benefit from skilled physical therapy services to address the aforementioned deficits.    Personal Factors and Comorbidities Comorbidity 3+;Age;Fitness;Past/Current Experience    Comorbidities HTN, hx of CA, COPD, depression, OA, osteopenia, PAD    Examination-Activity Limitations Locomotion Level;Transfers;Stairs    Stability/Clinical Decision Making Stable/Uncomplicated    Clinical Decision Making Low    Rehab Potential Fair    PT Frequency 2x / week    PT Duration 8 weeks    PT Treatment/Interventions Therapeutic activities;Therapeutic exercise;Balance training;Neuromuscular re-education;Patient/family education;Manual techniques;Dry needling;Aquatic Therapy;Canalith Repostioning;Electrical Stimulation;Iontophoresis 4mg /ml Dexamethasone;Gait training;Stair training;Functional  mobility training;Vestibular    PT Next Visit Plan Thoracic extension, posture, trunk, glute strengthening, femoral control, balance, manual techniques, modalities PRN    Consulted and Agree with Plan of Care Patient             Patient will benefit from skilled therapeutic intervention in order to improve the following deficits and impairments:  Pain, Postural dysfunction, Improper body mechanics, Difficulty walking, Decreased strength, Decreased balance, Decreased endurance  Visit Diagnosis: History of falling - Plan: PT plan of care cert/re-cert  Muscle weakness (generalized) - Plan: PT plan of care cert/re-cert     Problem List Patient Active Problem List   Diagnosis Date Noted   S/P TKR (total knee replacement) using cement, bilateral 09/19/2020   TIA (transient ischemic attack) 01/03/2020   Migraine headache with aura 01/03/2020   Essential hypertension 01/03/2020   Depression 01/03/2020   COPD with chronic bronchitis ([place]) 01/03/2020   Hypokalemia 01/03/2020   SOB (shortness of breath) 04/30/2014   Leg swelling 04/30/2014   Bronchiectasis without complication ([place]) 03/83/3383   History of DVT (deep vein thrombosis) 04/30/2014   Labile blood pressure 04/30/2014   S/P IVC filter 04/30/2014   History of right knee surgery 04/30/2014    Joneen Boers PT, DPT   07/02/2021, 4:26 PM  [facility] [address], [place], [address] Phone: [phone]   Fax:  [phone]  Name: Lindsey [name] MRN: [id number] Date of Birth: [birth year]

## 2021-07-04 DIAGNOSIS — L089 Local infection of the skin and subcutaneous tissue, unspecified: Secondary | ICD-10-CM | POA: Diagnosis not present

## 2021-07-04 DIAGNOSIS — L729 Follicular cyst of the skin and subcutaneous tissue, unspecified: Secondary | ICD-10-CM | POA: Diagnosis not present

## 2021-07-07 ENCOUNTER — Ambulatory Visit: Payer: PPO | Attending: Neurology

## 2021-07-07 DIAGNOSIS — Z9181 History of falling: Secondary | ICD-10-CM | POA: Diagnosis not present

## 2021-07-07 DIAGNOSIS — M6281 Muscle weakness (generalized): Secondary | ICD-10-CM | POA: Insufficient documentation

## 2021-07-07 NOTE — Therapy (Signed)
[facility] [address], [place], [address] Phone: [phone]   Fax:  [phone]  Physical Therapy Treatment  Patient Details  Name: Lindsey [name] MRN: [id number] Date of Birth: [birth year] Referring Provider (PT): Vladimir Crofts, MD   Encounter Date: 07/07/2021   PT End of Session - 07/07/21 1550     Visit Number 2    Number of Visits 17    Date for PT Re-Evaluation 08/28/21    Authorization Type 2    Authorization Time Period 10    PT Start Time 1550    PT Stop Time 1635    PT Time Calculation (min) 45 min    Equipment Utilized During Treatment Gait belt    Activity Tolerance Patient tolerated treatment well    Behavior During Therapy [facility] for tasks assessed/performed             Past Medical History:  Diagnosis Date   Anemia    Breast cancer ([place])    Bronchiectasis ([place]) 06/2007   Cancer ([place])    Colitis    COPD (chronic obstructive pulmonary disease) ([place])    Depression    DVT (deep venous thrombosis) ([place])    after her right knee surgery.    Fibromyalgia    Fibromyalgia    Hypertension    Migraine headache with aura    Osteoarthritis    Osteoporosis    PAD (peripheral artery disease) ([place])    Pneumonia    Polio 1952   Stenosis of carotid artery     Past Surgical History:  Procedure Laterality Date   ABDOMINAL HYSTERECTOMY     BACK SURGERY     BILATERAL TOTAL MASTECTOMY WITH AXILLARY LYMPH NODE DISSECTION     BREAST SURGERY     CARPAL TUNNEL RELEASE     bilateral    COLONOSCOPY WITH PROPOFOL N/A 08/02/2015   Procedure: COLONOSCOPY WITH PROPOFOL;  Surgeon: Manya Silvas, MD;  Location: [facility] ENDOSCOPY;  Service: Endoscopy;  Laterality: N/A;   FINGER TENDON REPAIR     HERNIA REPAIR     REPLACEMENT TOTAL KNEE     right knee    SQUAMOUS CELL CARCINOMA EXCISION     TONSILLECTOMY     TOTAL KNEE ARTHROPLASTY Left 09/19/2020   Procedure: TOTAL KNEE ARTHROPLASTY;   Surgeon: Hessie Knows, MD;  Location: [facility] ORS;  Service: Orthopedics;  Laterality: Left;   UMBILICAL HERNIA REPAIR     VAGINAL HYSTERECTOMY      There were no vitals filed for this visit.   Subjective Assessment - 07/07/21 1553     Subjective Doing ok. No pain or disomcort. Also forgot her hearing aids.  Tenderness is a little better.    Pertinent History Falls, balance. Had L TKA 09/2020. Has not been as steady as she would like since then. Also went through L knee PT at the [facility] (pt place of residence). Pt fell May 01, 2021 (2 months ago). Pt was on her way into the eye center. There was an island (mini hump shape)  in the parking lot and pt walked onto [employer] (pt was walking up the mini hump)  and fell backwards to her L side and hit her L elbow, knee, and L head above her ear onto the pavement. Did not break anything. Had an MRI for her head 3 weeks later which was negative. Pt currently has L masseter muscle pain since the fall (pain when  opening her mouth wide. Has not had a fall in 3 years except for this one.  Does not feel as strong as she needs to be and feels it when she tries to walk any distance. Gets tired walking beyond her mailbox (60 ft away) and then her legs feel heavy. Sometimes uses a rollator. Has a long walk to get to dinner and therefore uses her car.    Patient Stated Goals Get stronger upper body and lower body.    Currently in Pain? No/denies                                        PT Education - 07/07/21 1604     Education Details ther-ex, HEP    Person(s) Educated Patient    Methods Explanation;Demonstration;Tactile cues;Verbal cues;Handout    Comprehension Returned demonstration;Verbalized understanding            Objective      No latex allergies Blood pressure is labile but pills control it.   Had MILD procedure in low back 2 years ago, no laminectomy, no fusion.    Sit <> stand: B genu  vaigus/decreased femoral control L > R     TTP L greater trochanter   Medbridge Access Code [email or web address]  Therapeutic exercise   Recommended 50% weight bearing each LE so as to not place undo stress to L greater trochanter   Sit <> stand from regular chair 10x2 with emphasis on femoral control   Standing hip abduction with B UE assist   R 10x3  L 10x3  Standing hip extension with B UE assist   R 10x2  L 10x2  SLS with B UE assist, emphasis on level pelvis  R 10x5 seconds   L 10x5 seconds   Side stepping 32 ft to the R and 32 ft to the L. Emphasis on neutral hip adduction  Seated hip adduction isometrics with small physioball 10x5 seconds for 2 sets  Chin tucks 10x5 seconds to promote thoracic extension and ability to look up while walking.   Sitting upright with mirror visual cues to prevent backward lean.   Pt was recommended to sit upright with mirror cues at home to train her body where neutral is. Pt verbalized understanding.     Improved exercise technique, movement at target joints, use of target muscles after mod verbal, visual, tactile cues.      Response to treatment Slight increase in L greater trochanter tenderness with exercises.      Clinical impression Decreased L greater trochanter tenderness reported at start of session. Tenderness however might have increased from exercises. Pt was recommended to take a break from standing hip abduction home exercise if L hip symptoms increase. Pt verbalized understanding. Worked on improving glute med and max strength as well as femoral control to promote single leg stance balance during gait. Pt was also observed to demonstrate tendency for backwards lean while sitting in which pt states that feels neutral for her. Visual and tactile cues needed to help correct. Pt will benefit from continued skilled physical therapy services to decreased pain, improve strength, balance and function.         PT Short Term Goals -  07/02/21 1559       PT SHORT TERM GOAL #1   Title Pt will be independent with her initial HEP to improve strengh, balance and decrease fall risk.  Baseline Pt has started her HEP. (07/02/2021)    Time 3    Period Weeks    Status New    Target Date 07/24/21               PT Long Term Goals - 07/02/21 1602       PT LONG TERM GOAL #1   Title Pt will improve her DGI score by at least 3 points as a demonstration of decreased fall risk.    Baseline DGI score 19 (07/02/2021)    Time 8    Period Weeks    Status New    Target Date 08/28/21      PT LONG TERM GOAL #2   Title Pt will improve bilateral hip extension and abduction strength to promote ability to negotiate obstacles with less difficulty.    Baseline Hip extension 4/5 R, 4-/5 L, Hip abduction 4/5 R, 4/5 L (07/02/2021)    Time 8    Period Weeks    Status New    Target Date 08/28/21      PT LONG TERM GOAL #3   Title Pt will improve her FOTO score by at least 10 points as a demonstration of improved function.    Baseline Upper leg FOTO 47 (07/02/2021)    Time 8    Period Weeks    Status New    Target Date 08/28/21      PT LONG TERM GOAL #4   Title Pt will improve her 6 minute walk distance to 1000 ft to promote mobility at her home.    Baseline 6 minute walk distace 879 ft (07/02/2021)    Time 8    Period Weeks    Status New    Target Date 08/28/21                   Plan - 07/07/21 1605     Clinical Impression Statement Decreased L greater trochanter tenderness reported at start of session. Tenderness however might have increased from exercises. Pt was recommended to take a break from standing hip abduction home exercise if L hip symptoms increase. Pt verbalized understanding. Worked on improving glute med and max strength as well as femoral control to promote single leg stance balance during gait. Pt was also observed to demonstrate tendency for backwards lean while sitting in which pt states that feels  neutral for her. Visual and tactile cues needed to help correct. Pt will benefit from continued skilled physical therapy services to decreased pain, improve strength, balance and function.    Personal Factors and Comorbidities Comorbidity 3+;Age;Fitness;Past/Current Experience    Comorbidities HTN, hx of CA, COPD, depression, OA, osteopenia, PAD    Examination-Activity Limitations Locomotion Level;Transfers;Stairs    Stability/Clinical Decision Making Stable/Uncomplicated    Rehab Potential Fair    PT Frequency 2x / week    PT Duration 8 weeks    PT Treatment/Interventions Therapeutic activities;Therapeutic exercise;Balance training;Neuromuscular re-education;Patient/family education;Manual techniques;Dry needling;Aquatic Therapy;Canalith Repostioning;Electrical Stimulation;Iontophoresis 4mg /ml Dexamethasone;Gait training;Stair training;Functional mobility training;Vestibular    PT Next Visit Plan Thoracic extension, posture, trunk, glute strengthening, femoral control, balance, manual techniques, modalities PRN    Consulted and Agree with Plan of Care Patient             Patient will benefit from skilled therapeutic intervention in order to improve the following deficits and impairments:  Pain, Postural dysfunction, Improper body mechanics, Difficulty walking, Decreased strength, Decreased balance, Decreased endurance  Visit Diagnosis: History of falling  Muscle weakness (generalized)  Problem List Patient Active Problem List   Diagnosis Date Noted   S/P TKR (total knee replacement) using cement, bilateral 09/19/2020   TIA (transient ischemic attack) 01/03/2020   Migraine headache with aura 01/03/2020   Essential hypertension 01/03/2020   Depression 01/03/2020   COPD with chronic bronchitis ([place]) 01/03/2020   Hypokalemia 01/03/2020   SOB (shortness of breath) 04/30/2014   Leg swelling 04/30/2014   Bronchiectasis without complication ([place]) 34/19/6222   History of DVT (deep  vein thrombosis) 04/30/2014   Labile blood pressure 04/30/2014   S/P IVC filter 04/30/2014   History of right knee surgery 04/30/2014    Joneen Boers PT, DPT   07/07/2021, 4:52 PM  [facility] [address] [place], [address] Phone: [phone]   Fax:  [phone]  Name: KARYME MCCONATHY MRN: [id number] Date of Birth: [birth year]

## 2021-07-07 NOTE — Patient Instructions (Addendum)
  Access Code: [email or web address] URL: [email or web address] Date: 07/07/2021 Prepared by: Joneen Boers  Exercises Standing Hip Abduction with Counter Support - 1 x daily - 7 x weekly - 3 sets - 10 reps Standing Hip Extension with Counter Support - 1 x daily - 7 x weekly - 2 sets - 10 reps    Pt was recommended to take a break from standing hip abduction home exercise if L hip symptoms increase. Pt verbalized understanding.     Pt was recommended to sit upright with mirror cues at home to train her body where neutral is. Pt verbalized understandin

## 2021-07-09 ENCOUNTER — Ambulatory Visit: Payer: PPO

## 2021-07-09 DIAGNOSIS — M6281 Muscle weakness (generalized): Secondary | ICD-10-CM

## 2021-07-09 DIAGNOSIS — Z9181 History of falling: Secondary | ICD-10-CM

## 2021-07-09 NOTE — Therapy (Signed)
[facility] [address], [place], [address] Phone: [phone]   Fax:  [phone]  Physical Therapy Treatment  Patient Details  Name: Lindsey [name] MRN: [id number] Date of Birth: [birth year] Referring Provider (PT): Vladimir Crofts, MD   Encounter Date: 07/09/2021   PT End of Session - 07/09/21 1426     Visit Number 3    Number of Visits 17    Date for PT Re-Evaluation 08/28/21    Authorization Type 3    Authorization Time Period 10    PT Start Time 1426   pt arrived late   PT Stop Time 1459    PT Time Calculation (min) 33 min    Equipment Utilized During Treatment --    Activity Tolerance Patient tolerated treatment well    Behavior During Therapy [facility] for tasks assessed/performed             Past Medical History:  Diagnosis Date   Anemia    Breast cancer ([place])    Bronchiectasis ([place]) 06/2007   Cancer ([place])    Colitis    COPD (chronic obstructive pulmonary disease) ([place])    Depression    DVT (deep venous thrombosis) ([place])    after her right knee surgery.    Fibromyalgia    Fibromyalgia    Hypertension    Migraine headache with aura    Osteoarthritis    Osteoporosis    PAD (peripheral artery disease) ([place])    Pneumonia    Polio 1952   Stenosis of carotid artery     Past Surgical History:  Procedure Laterality Date   ABDOMINAL HYSTERECTOMY     BACK SURGERY     BILATERAL TOTAL MASTECTOMY WITH AXILLARY LYMPH NODE DISSECTION     BREAST SURGERY     CARPAL TUNNEL RELEASE     bilateral    COLONOSCOPY WITH PROPOFOL N/A 08/02/2015   Procedure: COLONOSCOPY WITH PROPOFOL;  Surgeon: Manya Silvas, MD;  Location: [facility] ENDOSCOPY;  Service: Endoscopy;  Laterality: N/A;   FINGER TENDON REPAIR     HERNIA REPAIR     REPLACEMENT TOTAL KNEE     right knee    SQUAMOUS CELL CARCINOMA EXCISION     TONSILLECTOMY     TOTAL KNEE ARTHROPLASTY Left 09/19/2020   Procedure: TOTAL KNEE  ARTHROPLASTY;  Surgeon: Hessie Knows, MD;  Location: [facility] ORS;  Service: Orthopedics;  Laterality: Left;   UMBILICAL HERNIA REPAIR     VAGINAL HYSTERECTOMY      There were no vitals filed for this visit.   Subjective Assessment - 07/09/21 1428     Subjective Feeling dizzy and weak since this morning. Woke up like this. Was nomal yesterday. Ate breakfast, and lunch today as well as dinner yesterday. Thinks she drank enough fluids. Room does not look like its spinning but it feels like it is. Currently feels a little whoozy like she is swaying side to side but no visual movement of environment. Had vertigo before and this does not seem like it is vertigo. No blurred vision. L hip has been bothering her. Neck has also been tight. Neck has been bothering her for the last couple of days.    Pertinent History Falls, balance. Had L TKA 09/2020. Has not been as steady as she would like since then. Also went through L knee PT at the [facility] (pt place of residence). Pt fell May 01, 2021 (2 months ago). Pt was  on her way into the eye center. There was an island (mini hump shape)  in the parking lot and pt walked onto [employer] (pt was walking up the mini hump)  and fell backwards to her L side and hit her L elbow, knee, and L head above her ear onto the pavement. Did not break anything. Had an MRI for her head 3 weeks later which was negative. Pt currently has L masseter muscle pain since the fall (pain when opening her mouth wide. Has not had a fall in 3 years except for this one.  Does not feel as strong as she needs to be and feels it when she tries to walk any distance. Gets tired walking beyond her mailbox (60 ft away) and then her legs feel heavy. Sometimes uses a rollator. Has a long walk to get to dinner and therefore uses her car.    Patient Stated Goals Get stronger upper body and lower body.    Currently in Pain? No/denies                                         PT Education - 07/09/21 1704     Education Details ther-ex    Person(s) Educated Patient    Methods Explanation;Demonstration;Tactile cues;Verbal cues    Comprehension Returned demonstration;Verbalized understanding             Objective      No latex allergies Blood pressure is labile but pills control it.   Had MILD procedure in low back 2 years ago, no laminectomy, no fusion.    Sit <> stand: B genu vaigus/decreased femoral control L > R     TTP L greater trochanter   Medbridge Access Code [email or web address]   Therapeutic exercise   Blood pressure L arm sitting, mechanically taken, normal cuff: 149/81, HR 74  Cervical   Flexion: decreases dizziness.   Extension: no change   Side bend   R no change   L no change  Rotation   R no change  L no change in dizziness but has neck discomfort.    Cervical flexion stretch 7x10 seconds, then 5x10 seconds  Decreased LE weakness  Hold off chin tucks for now    Improved exercise technique, movement at target joints, use of target muscles after mod verbal, visual, tactile cues.    Manual therapy Seated STM cervical paraspinal and upper trap muscles to decrease tension   No dizziness at rest afterwards    Response to treatment Decreased dizziness and improved strength and steadiness with gait.      Clinical impression Pt arrived late so session was adjusted accordingly. Decreased dizziness and decreased weakness sensation and improved steadiness with gait with treatment to decrease cervical extension stress and decrease muscle tension to cervical paraspinal and upper trap muscles. Pt will benefit from continued skilled physical therapy services to decreased pain, improve strength, balance and function.           PT Short Term Goals - 07/02/21 1559       PT SHORT TERM GOAL #1   Title Pt will be independent with her initial HEP to improve strengh, balance and  decrease fall risk.    Baseline Pt has started her HEP. (07/02/2021)    Time 3    Period Weeks    Status New    Target Date 07/24/21  PT Long Term Goals - 07/02/21 1602       PT LONG TERM GOAL #1   Title Pt will improve her DGI score by at least 3 points as a demonstration of decreased fall risk.    Baseline DGI score 19 (07/02/2021)    Time 8    Period Weeks    Status New    Target Date 08/28/21      PT LONG TERM GOAL #2   Title Pt will improve bilateral hip extension and abduction strength to promote ability to negotiate obstacles with less difficulty.    Baseline Hip extension 4/5 R, 4-/5 L, Hip abduction 4/5 R, 4/5 L (07/02/2021)    Time 8    Period Weeks    Status New    Target Date 08/28/21      PT LONG TERM GOAL #3   Title Pt will improve her FOTO score by at least 10 points as a demonstration of improved function.    Baseline Upper leg FOTO 47 (07/02/2021)    Time 8    Period Weeks    Status New    Target Date 08/28/21      PT LONG TERM GOAL #4   Title Pt will improve her 6 minute walk distance to 1000 ft to promote mobility at her home.    Baseline 6 minute walk distace 879 ft (07/02/2021)    Time 8    Period Weeks    Status New    Target Date 08/28/21                   Plan - 07/09/21 1453     Clinical Impression Statement Pt arrived late so session was adjusted accordingly. Decreased dizziness and decreased weakness sensation and improved steadiness with gait with treatment to decrease cervical extension stress and decrease muscle tension to cervical paraspinal and upper trap muscles. Pt will benefit from continued skilled physical therapy services to decreased pain, improve strength, balance and function.    Personal Factors and Comorbidities Comorbidity 3+;Age;Fitness;Past/Current Experience    Comorbidities HTN, hx of CA, COPD, depression, OA, osteopenia, PAD    Examination-Activity Limitations Locomotion Level;Transfers;Stairs     Stability/Clinical Decision Making Stable/Uncomplicated    Rehab Potential Fair    PT Frequency 2x / week    PT Duration 8 weeks    PT Treatment/Interventions Therapeutic activities;Therapeutic exercise;Balance training;Neuromuscular re-education;Patient/family education;Manual techniques;Dry needling;Aquatic Therapy;Canalith Repostioning;Electrical Stimulation;Iontophoresis 4mg /ml Dexamethasone;Gait training;Stair training;Functional mobility training;Vestibular    PT Next Visit Plan Thoracic extension, posture, trunk, glute strengthening, femoral control, balance, manual techniques, modalities PRN    Consulted and Agree with Plan of Care Patient             Patient will benefit from skilled therapeutic intervention in order to improve the following deficits and impairments:  Pain, Postural dysfunction, Improper body mechanics, Difficulty walking, Decreased strength, Decreased balance, Decreased endurance  Visit Diagnosis: History of falling  Muscle weakness (generalized)     Problem List Patient Active Problem List   Diagnosis Date Noted   S/P TKR (total knee replacement) using cement, bilateral 09/19/2020   TIA (transient ischemic attack) 01/03/2020   Migraine headache with aura 01/03/2020   Essential hypertension 01/03/2020   Depression 01/03/2020   COPD with chronic bronchitis ([place]) 01/03/2020   Hypokalemia 01/03/2020   SOB (shortness of breath) 04/30/2014   Leg swelling 04/30/2014   Bronchiectasis without complication ([place]) 15/72/6203   History of DVT (deep vein thrombosis) 04/30/2014   Labile blood  pressure 04/30/2014   S/P IVC filter 04/30/2014   History of right knee surgery 04/30/2014    Bilan Tedesco, PT 07/09/2021, 5:08 PM  [facility] [address] [place], [address] Phone: [phone]   Fax:  [phone]  Name: Lindsey [name] MRN: [id number] Date of Birth: [birth year]

## 2021-07-09 NOTE — Patient Instructions (Addendum)
Pt was recommended to hold off on the standing hip abduction exercise at home secondary to hip tenderness. Pt verbalized understanding.   Recommended to use heating pad to neck and upper trap muscles to decrease tension as well as to perform cervical flexion to stretch posterior neck and decrease extension stress. Pt verbalized understanding.

## 2021-07-11 DIAGNOSIS — S61200A Unspecified open wound of right index finger without damage to nail, initial encounter: Secondary | ICD-10-CM | POA: Diagnosis not present

## 2021-07-11 DIAGNOSIS — L0889 Other specified local infections of the skin and subcutaneous tissue: Secondary | ICD-10-CM | POA: Diagnosis not present

## 2021-07-11 DIAGNOSIS — L728 Other follicular cysts of the skin and subcutaneous tissue: Secondary | ICD-10-CM | POA: Diagnosis not present

## 2021-07-14 ENCOUNTER — Ambulatory Visit: Payer: PPO

## 2021-07-16 ENCOUNTER — Ambulatory Visit: Payer: PPO

## 2021-07-16 DIAGNOSIS — M6281 Muscle weakness (generalized): Secondary | ICD-10-CM

## 2021-07-16 DIAGNOSIS — Z9181 History of falling: Secondary | ICD-10-CM

## 2021-07-16 NOTE — Patient Instructions (Addendum)
Seated hip extension isometrics   Sitting on a chair,    Squeeze your rear end muscles together and press your left foot onto the floor.     Hold for 5 seconds    Repeat 10 times   Perform 2 sets daily.      This is a corrective exercise. Once you no longer have symptoms, you can stop.    Access Code: [email or web address] URL: [email or web address] Date: 07/16/2021 Prepared by: Joneen Boers  Exercises Standing Hip Extension with Counter Support - 1 x daily - 7 x weekly - 2 sets - 10 reps Seated Scapular Retraction - 1 x daily - 7 x weekly - 3 sets - 10 reps - 5 seconds hold

## 2021-07-16 NOTE — Therapy (Signed)
[facility] [address], [place], [address] Phone: [phone]   Fax:  [phone]  Physical Therapy Treatment  Patient Details  Name: Lindsey [name] MRN: [id number] Date of Birth: 25-Mar-1936 Referring Provider (PT): Vladimir Crofts, MD   Encounter Date: 07/16/2021   PT End of Session - 07/16/21 1332     Visit Number 4    Number of Visits 17    Date for PT Re-Evaluation 08/28/21    Authorization Type 4    Authorization Time Period 10    PT Start Time 1332    PT Stop Time 1416    PT Time Calculation (min) 44 min    Activity Tolerance Patient tolerated treatment well    Behavior During Therapy [facility] for tasks assessed/performed             Past Medical History:  Diagnosis Date   Anemia    Breast cancer ([place])    Bronchiectasis ([place]) 06/2007   Cancer ([place])    Colitis    COPD (chronic obstructive pulmonary disease) ([place])    Depression    DVT (deep venous thrombosis) ([place])    after her right knee surgery.    Fibromyalgia    Fibromyalgia    Hypertension    Migraine headache with aura    Osteoarthritis    Osteoporosis    PAD (peripheral artery disease) ([place])    Pneumonia    Polio 1952   Stenosis of carotid artery     Past Surgical History:  Procedure Laterality Date   ABDOMINAL HYSTERECTOMY     BACK SURGERY     BILATERAL TOTAL MASTECTOMY WITH AXILLARY LYMPH NODE DISSECTION     BREAST SURGERY     CARPAL TUNNEL RELEASE     bilateral    COLONOSCOPY WITH PROPOFOL N/A 08/02/2015   Procedure: COLONOSCOPY WITH PROPOFOL;  Surgeon: Manya Silvas, MD;  Location: [facility] ENDOSCOPY;  Service: Endoscopy;  Laterality: N/A;   FINGER TENDON REPAIR     HERNIA REPAIR     REPLACEMENT TOTAL KNEE     right knee    SQUAMOUS CELL CARCINOMA EXCISION     TONSILLECTOMY     TOTAL KNEE ARTHROPLASTY Left 09/19/2020   Procedure: TOTAL KNEE ARTHROPLASTY;  Surgeon: Hessie Knows, MD;  Location: [facility] ORS;   Service: Orthopedics;  Laterality: Left;   UMBILICAL HERNIA REPAIR     VAGINAL HYSTERECTOMY      There were no vitals filed for this visit.   Subjective Assessment - 07/16/21 1334     Subjective Dizziness comes and goes. The heat helps. Was better after the manual therapy last session. Low back is bothering her, maybe from water aerobics. Also takes pretty strong blood pressure medicine.    Pertinent History Falls, balance. Had L TKA 09/2020. Has not been as steady as she would like since then. Also went through L knee PT at the [facility] (pt place of residence). Pt fell May 01, 2021 (2 months ago). Pt was on her way into the eye center. There was an island (mini hump shape)  in the parking lot and pt walked onto [employer] (pt was walking up the mini hump)  and fell backwards to her L side and hit her L elbow, knee, and L head above her ear onto the pavement. Did not break anything. Had an MRI for her head 3 weeks later which was negative. Pt currently has L masseter muscle pain  since the fall (pain when opening her mouth wide. Has not had a fall in 3 years except for this one.  Does not feel as strong as she needs to be and feels it when she tries to walk any distance. Gets tired walking beyond her mailbox (60 ft away) and then her legs feel heavy. Sometimes uses a rollator. Has a long walk to get to dinner and therefore uses her car.    Patient Stated Goals Get stronger upper body and lower body.    Currently in Pain? Yes    Pain Location Back                                        PT Education - 07/16/21 1348     Education Details ther-ex, HEP    Person(s) Educated Patient    Methods Explanation;Demonstration;Tactile cues;Verbal cues;Handout    Comprehension Returned demonstration;Verbalized understanding           Objective      No latex allergies Blood pressure is labile but pills control it.   Had MILD procedure in low back 2 years  ago, no laminectomy, no fusion.    Sit <> stand: B genu vaigus/decreased femoral control L > R     TTP L greater trochanter  Standing posture: L lateral shift, L pelvis anterior tilt   Medbridge Access Code [email or web address]   Therapeutic exercise   Seated B scapular retraction 10x3 with 5 second holds   Seated hip extension isometrics   L 10x5 seconds for 2 sets   Improved posture   Decreased low back pain  Seated manually resisted R trunk lateral shift isometrics in neutral to improve posture 10x3 with 5 second holds   Standing forward weight shifting, heels on floor for ankle strategy 10x5 seconds eyes open  Then eyes closed 10x5 seconds for 2 sets  Decreased feeling of sway with activation of scapular muscles   SLS with contralateral UE 2 finger assist   R 10x5 seconds   L 10x5 seconds     Improved exercise technique, movement at target joints, use of target muscles after mod verbal, visual, tactile cues.      Manual therapy Seated STM cervical paraspinal and upper trap muscles to decrease tension              No dizziness at rest afterwards     Response to treatment Back feels better after session and decreased internal feeling of sway with scapular retraction.   Clinical impression Decreased dizziness with middle and lower trap activation to decrease upper trap muscle tension to her neck. Decreased low back pain with treatment to decrease L lateral shift and glute max activation. Pt will benefit from continued skilled physical therapy services to decreased pain, improve strength, balance and function.         PT Short Term Goals - 07/02/21 1559       PT SHORT TERM GOAL #1   Title Pt will be independent with her initial HEP to improve strengh, balance and decrease fall risk.    Baseline Pt has started her HEP. (07/02/2021)    Time 3    Period Weeks    Status New    Target Date 07/24/21               PT Long Term Goals - 07/02/21 1602       PT  LONG TERM  GOAL #1   Title Pt will improve her DGI score by at least 3 points as a demonstration of decreased fall risk.    Baseline DGI score 19 (07/02/2021)    Time 8    Period Weeks    Status New    Target Date 08/28/21      PT LONG TERM GOAL #2   Title Pt will improve bilateral hip extension and abduction strength to promote ability to negotiate obstacles with less difficulty.    Baseline Hip extension 4/5 R, 4-/5 L, Hip abduction 4/5 R, 4/5 L (07/02/2021)    Time 8    Period Weeks    Status New    Target Date 08/28/21      PT LONG TERM GOAL #3   Title Pt will improve her FOTO score by at least 10 points as a demonstration of improved function.    Baseline Upper leg FOTO 47 (07/02/2021)    Time 8    Period Weeks    Status New    Target Date 08/28/21      PT LONG TERM GOAL #4   Title Pt will improve her 6 minute walk distance to 1000 ft to promote mobility at her home.    Baseline 6 minute walk distace 879 ft (07/02/2021)    Time 8    Period Weeks    Status New    Target Date 08/28/21                   Plan - 07/16/21 1332     Personal Factors and Comorbidities Comorbidity 3+;Age;Fitness;Past/Current Experience    Comorbidities HTN, hx of CA, COPD, depression, OA, osteopenia, PAD    Examination-Activity Limitations Locomotion Level;Transfers;Stairs    Stability/Clinical Decision Making Stable/Uncomplicated    Rehab Potential Fair    PT Frequency 2x / week    PT Duration 8 weeks    PT Treatment/Interventions Therapeutic activities;Therapeutic exercise;Balance training;Neuromuscular re-education;Patient/family education;Manual techniques;Dry needling;Aquatic Therapy;Canalith Repostioning;Electrical Stimulation;Iontophoresis 4mg /ml Dexamethasone;Gait training;Stair training;Functional mobility training;Vestibular    PT Next Visit Plan Thoracic extension, posture, trunk, glute strengthening, femoral control, balance, manual techniques, modalities PRN    Consulted and Agree with  Plan of Care Patient             Patient will benefit from skilled therapeutic intervention in order to improve the following deficits and impairments:  Pain, Postural dysfunction, Improper body mechanics, Difficulty walking, Decreased strength, Decreased balance, Decreased endurance  Visit Diagnosis: History of falling  Muscle weakness (generalized)     Problem List Patient Active Problem List   Diagnosis Date Noted   S/P TKR (total knee replacement) using cement, bilateral 09/19/2020   TIA (transient ischemic attack) 01/03/2020   Migraine headache with aura 01/03/2020   Essential hypertension 01/03/2020   Depression 01/03/2020   COPD with chronic bronchitis ([place]) 01/03/2020   Hypokalemia 01/03/2020   SOB (shortness of breath) 04/30/2014   Leg swelling 04/30/2014   Bronchiectasis without complication ([place]) 42/59/5638   History of DVT (deep vein thrombosis) 04/30/2014   Labile blood pressure 04/30/2014   S/P IVC filter 04/30/2014   History of right knee surgery 04/30/2014    Joneen Boers PT, DPT   07/16/2021, 3:11 PM  [facility] [address] [place], [address] Phone: [phone]   Fax:  [phone]  Name: ZELTA ENFIELD MRN: [id number] Date of Birth: 1936-04-09

## 2021-07-21 ENCOUNTER — Ambulatory Visit: Payer: PPO

## 2021-07-21 DIAGNOSIS — Z9181 History of falling: Secondary | ICD-10-CM | POA: Diagnosis not present

## 2021-07-21 DIAGNOSIS — M6281 Muscle weakness (generalized): Secondary | ICD-10-CM

## 2021-07-21 NOTE — Therapy (Signed)
[facility] [address], [place], [address] Phone: [phone]   Fax:  [phone]  Physical Therapy Treatment  Patient Details  Name: Lindsey [name] MRN: [id number] Date of Birth: 21-Jul-1936 Referring Provider (PT): Vladimir Crofts, MD   Encounter Date: 07/21/2021   PT End of Session - 07/21/21 1106     Visit Number 5    Number of Visits 17    Date for PT Re-Evaluation 08/28/21    Authorization Type 5    Authorization Time Period 10    PT Start Time 1106    PT Stop Time 1144    PT Time Calculation (min) 38 min    Activity Tolerance Patient tolerated treatment well    Behavior During Therapy [facility] for tasks assessed/performed             Past Medical History:  Diagnosis Date   Anemia    Breast cancer ([place])    Bronchiectasis ([place]) 06/2007   Cancer ([place])    Colitis    COPD (chronic obstructive pulmonary disease) ([place])    Depression    DVT (deep venous thrombosis) ([place])    after her right knee surgery.    Fibromyalgia    Fibromyalgia    Hypertension    Migraine headache with aura    Osteoarthritis    Osteoporosis    PAD (peripheral artery disease) ([place])    Pneumonia    [place] 1952   Stenosis of carotid artery     Past Surgical History:  Procedure Laterality Date   ABDOMINAL HYSTERECTOMY     BACK SURGERY     BILATERAL TOTAL MASTECTOMY WITH AXILLARY LYMPH NODE DISSECTION     BREAST SURGERY     CARPAL TUNNEL RELEASE     bilateral    COLONOSCOPY WITH PROPOFOL N/A 08/02/2015   Procedure: COLONOSCOPY WITH PROPOFOL;  Surgeon: Manya Silvas, MD;  Location: [facility] ENDOSCOPY;  Service: Endoscopy;  Laterality: N/A;   FINGER TENDON REPAIR     HERNIA REPAIR     REPLACEMENT TOTAL KNEE     right knee    SQUAMOUS CELL CARCINOMA EXCISION     TONSILLECTOMY     TOTAL KNEE ARTHROPLASTY Left 09/19/2020   Procedure: TOTAL KNEE ARTHROPLASTY;  Surgeon: Hessie Knows, MD;  Location: [facility] ORS;   Service: Orthopedics;  Laterality: Left;   UMBILICAL HERNIA REPAIR     VAGINAL HYSTERECTOMY      There were no vitals filed for this visit.   Subjective Assessment - 07/21/21 1107     Subjective Doing fair. The dizziness is not bad but her low back is killing her. Started about 3 days ago. Does not know what happened. Just woke up wiht back pain and was not able to do her home exercises. Did some Allen which helped but increases when she starts walking.    Pertinent History Falls, balance. Had L TKA 09/2020. Has not been as steady as she would like since then. Also went through L knee PT at [facility] (pt place of residence). Pt fell May 01, 2021 (2 months ago). Pt was on her way into the eye center. There was an island (mini hump shape)  in the parking lot and pt walked onto [employer] (pt was walking up the mini hump)  and fell backwards to her L side and hit her L elbow, knee, and L head above her ear onto the pavement. Did not break anything.  Had an MRI for her head 3 weeks later which was negative. Pt currently has L masseter muscle pain since the fall (pain when opening her mouth wide. Has not had a fall in 3 years except for this one.  Does not feel as strong as she needs to be and feels it when she tries to walk any distance. Gets tired walking beyond her mailbox (60 ft away) and then her legs feel heavy. Sometimes uses a rollator. Has a long walk to get to dinner and therefore uses her car.    Patient Stated Goals Get stronger upper body and lower body.    Currently in Pain? Yes    Pain Score 5     Pain Location Back    Pain Orientation Right;Left;Lower                [facility] PT Assessment - 07/21/21 1109       AROM   Lumbar Flexion full, increased back pain during return motion    Lumbar Extension WFL, decreased back pain    Lumbar - Right Side Bend WFL    Lumbar - Left Side Bend WFL    Lumbar - Right Rotation WFL with low back discomfort    Lumbar - Left  Rotation WFL with low back discomfort.      Special Tests   Other special tests Negative repeated flexion test for low back. Dizziness but room did not look like it is spinning. Eases with rest.                                    PT Education - 07/21/21 1120     Education Details thre-ex    Person(s) Educated Patient    Methods Explanation;Demonstration;Tactile cues;Verbal cues    Comprehension Returned demonstration;Verbalized understanding            Objective      No latex allergies Blood pressure is labile but pills control it.   Had MILD procedure in low back 2 years ago, no laminectomy, no fusion.    Sit <> stand: B genu vaigus/decreased femoral control L > R     TTP L greater trochanter   Standing posture: L lateral shift, L pelvis anterior tilt   Medbridge Access Code [email or web address]   Therapeutic exercise    Standing lumbar AROM all planes Standing trunk flexion 8x  Seated manually resisted R trunk lateral shift isometrics in neutral to improve posture 10x3 with 5 second holds     Sitting on towel roll  Trunk extension 10x5 seconds, then 7x5 seconds. secondary to extension preference.    Transversus abdominis contraction 10x5 seconds for 2 sets   Seated B scapular retraction 10x3 with 5 second holds      Improved exercise technique, movement at target joints, use of target muscles after mod verbal, visual, tactile cues.      Manual therapy  Seated STM cervical paraspinal and upper trap muscles to decrease tension                Response to treatment Pt tolerated session well without aggravation of symptoms.    Clinical impression Extension preference. Decreased back pain with transversus abdominis activation. Decreased dizziness with decreasing muscle tension around neck. Pt will benefit from continued skilled physical therapy services to decrease pain, improve strength, balance and function.       PT Short Term Goals  - 07/02/21  [place]atherine #1   Title Pt will be independent with her initial HEP to improve strengh, balance and decrease fall risk.    Baseline Pt has started her HEP. (07/02/2021)    Time 3    Period Weeks    Status New    Target Date 07/24/21               PT Long Term Goals - 07/02/21 1602       PT LONG TERM GOAL #1   Title Pt will improve her DGI score by at least 3 points as a demonstration of decreased fall risk.    Baseline DGI score 19 (07/02/2021)    Time 8    Period Weeks    Status New    Target Date 08/28/21      PT LONG TERM GOAL #2   Title Pt will improve bilateral hip extension and abduction strength to promote ability to negotiate obstacles with less difficulty.    Baseline Hip extension 4/5 R, 4-/5 L, Hip abduction 4/5 R, 4/5 L (07/02/2021)    Time 8    Period Weeks    Status New    Target Date 08/28/21      PT LONG TERM GOAL #3   Title Pt will improve her FOTO score by at least 10 points as a demonstration of improved function.    Baseline Upper leg FOTO 47 (07/02/2021)    Time 8    Period Weeks    Status New    Target Date 08/28/21      PT LONG TERM GOAL #4   Title Pt will improve her 6 minute walk distance to 1000 ft to promote mobility at her home.    Baseline 6 minute walk distace 879 ft (07/02/2021)    Time 8    Period Weeks    Status New    Target Date 08/28/21                   Plan - 07/21/21 1106     Clinical Impression Statement Extension preference. Decreased back pain with transversus abdominis activation. Decreased dizziness with decreasing muscle tension around neck. Pt will benefit from continued skilled physical therapy services to decrease pain, improve strength, balance and function.    Personal Factors and Comorbidities Comorbidity 3+;Age;Fitness;Past/Current Experience    Comorbidities HTN, hx of CA, COPD, depression, OA, osteopenia, PAD    Examination-Activity Limitations Locomotion  Level;Transfers;Stairs    Stability/Clinical Decision Making Stable/Uncomplicated    Rehab Potential Fair    PT Frequency 2x / week    PT Duration 8 weeks    PT Treatment/Interventions Therapeutic activities;Therapeutic exercise;Balance training;Neuromuscular re-education;Patient/family education;Manual techniques;Dry needling;Aquatic Therapy;Canalith Repostioning;Electrical Stimulation;Iontophoresis 4mg /ml Dexamethasone;Gait training;Stair training;Functional mobility training;Vestibular    PT Next Visit Plan Thoracic extension, posture, trunk, glute strengthening, femoral control, balance, manual techniques, modalities PRN    Consulted and Agree with Plan of Care Patient             Patient will benefit from skilled therapeutic intervention in order to improve the following deficits and impairments:  Pain, Postural dysfunction, Improper body mechanics, Difficulty walking, Decreased strength, Decreased balance, Decreased endurance  Visit Diagnosis: History of falling  Muscle weakness (generalized)     Problem List Patient Active Problem List   Diagnosis Date Noted   S/P TKR (total knee replacement) using cement, bilateral 09/19/2020   TIA (transient ischemic attack) 01/03/2020   Migraine headache  with aura 01/03/2020   Essential hypertension 01/03/2020   Depression 01/03/2020   COPD with chronic bronchitis ([place]) 01/03/2020   Hypokalemia 01/03/2020   SOB (shortness of breath) 04/30/2014   Leg swelling 04/30/2014   Bronchiectasis without complication ([place]) 00/76/2263   History of DVT (deep vein thrombosis) 04/30/2014   Labile blood pressure 04/30/2014   S/P IVC filter 04/30/2014   History of right knee surgery 04/30/2014    Joneen Boers PT, DPT   07/21/2021, 1:11 PM  [facility] [address] [place], [address] Phone: [phone]   Fax:  [phone]  Name: Lindsey [name] MRN:  [id number] Date of Birth: 1936-08-03

## 2021-07-21 NOTE — Patient Instructions (Signed)
Held off seated L hip extension isometrics HEP. Pt verbalized understanding.

## 2021-07-23 ENCOUNTER — Ambulatory Visit: Payer: PPO

## 2021-07-23 DIAGNOSIS — M6281 Muscle weakness (generalized): Secondary | ICD-10-CM

## 2021-07-23 DIAGNOSIS — Z9181 History of falling: Secondary | ICD-10-CM

## 2021-07-23 NOTE — Therapy (Signed)
[facility] [address], [place], [address] Phone: [phone]   Fax:  [phone]  Physical Therapy Treatment  Patient Details  Name: Lindsey [name] MRN: [id number] Date of Birth: May 11, 1936 Referring Provider (PT): Vladimir Crofts, MD   Encounter Date: 07/23/2021   PT End of Session - 07/23/21 1511     Visit Number 6    Number of Visits 17    Date for PT Re-Evaluation 08/28/21    Authorization Type 6    Authorization Time Period 10    PT Start Time 1511    PT Stop Time 1546    PT Time Calculation (min) 35 min    Activity Tolerance Patient tolerated treatment well    Behavior During Therapy [facility] for tasks assessed/performed             Past Medical History:  Diagnosis Date   Anemia    Breast cancer ([place])    Bronchiectasis ([place]) 06/2007   Cancer ([place])    Colitis    COPD (chronic obstructive pulmonary disease) ([place])    Depression    DVT (deep venous thrombosis) ([place])    after her right knee surgery.    Fibromyalgia    Fibromyalgia    Hypertension    Migraine headache with aura    Osteoarthritis    Osteoporosis    PAD (peripheral artery disease) ([place])    Pneumonia    [place] 1952   Stenosis of carotid artery     Past Surgical History:  Procedure Laterality Date   ABDOMINAL HYSTERECTOMY     BACK SURGERY     BILATERAL TOTAL MASTECTOMY WITH AXILLARY LYMPH NODE DISSECTION     BREAST SURGERY     CARPAL TUNNEL RELEASE     bilateral    COLONOSCOPY WITH PROPOFOL N/A 08/02/2015   Procedure: COLONOSCOPY WITH PROPOFOL;  Surgeon: Manya Silvas, MD;  Location: [facility] ENDOSCOPY;  Service: Endoscopy;  Laterality: N/A;   FINGER TENDON REPAIR     HERNIA REPAIR     REPLACEMENT TOTAL KNEE     right knee    SQUAMOUS CELL CARCINOMA EXCISION     TONSILLECTOMY     TOTAL KNEE ARTHROPLASTY Left 09/19/2020   Procedure: TOTAL KNEE ARTHROPLASTY;  Surgeon: Hessie Knows, MD;  Location: [facility] ORS;   Service: Orthopedics;  Laterality: Left;   UMBILICAL HERNIA REPAIR     VAGINAL HYSTERECTOMY      There were no vitals filed for this visit.   Subjective Assessment - 07/23/21 1513     Subjective L knee feels like it is going to give when it is straight. Dizziness is not bad, and her lower back is better.    Pertinent History Falls, balance. Had L TKA 09/2020. Has not been as steady as she would like since then. Also went through L knee PT at the [facility] (pt place of residence). Pt fell May 01, 2021 (2 months ago). Pt was on her way into the eye center. There was an island (mini hump shape)  in the parking lot and pt walked onto [employer] (pt was walking up the mini hump)  and fell backwards to her L side and hit her L elbow, knee, and L head above her ear onto the pavement. Did not break anything. Had an MRI for her head 3 weeks later which was negative. Pt currently has L masseter muscle pain since the fall (pain when opening her mouth  wide. Has not had a fall in 3 years except for this one.  Does not feel as strong as she needs to be and feels it when she tries to walk any distance. Gets tired walking beyond her mailbox (60 ft away) and then her legs feel heavy. Sometimes uses a rollator. Has a long walk to get to dinner and therefore uses her car.    Patient Stated Goals Get stronger upper body and lower body.    Currently in Pain? Yes    Pain Score 2     Pain Location Knee    Pain Orientation Left                                        PT Education - 07/23/21 1537     Education Details ther-ex    Person(s) Educated Patient    Methods Explanation;Demonstration;Tactile cues;Verbal cues    Comprehension Returned demonstration;Verbalized understanding             Objective      No latex allergies Blood pressure is labile but pills control it.   Had MILD procedure in low back 2 years ago, no laminectomy, no fusion.    Sit <> stand: B  genu vaigus/decreased femoral control L > R     TTP L greater trochanter   Standing posture: L lateral shift, L pelvis anterior tilt   Medbridge Access Code [email or web address]   Therapeutic exercise   SLS on L LE with R UE assist with slight knee bend 10x5 seconds for 3 sets  Standing B scapular retraction yellow band 10x5 seconds for 3 sets  Side stepping 30 ft to the L and 30 ft to the R to promote glute med strength   Standing forward weight shifting, heels on floor 10x3 with 5 seconds   Stepping over 6 mini hurdles 2x   Improved exercise technique, movement at target joints, use of target muscles after mod verbal, visual, tactile cues.          Response to treatment Pt tolerated session well without aggravation of symptoms.    Clinical impression  Pt arrived late so session was adjusted accordingly. No dizziness reported during session. Worked on end range knee extension quad strengthening as well a glute strengthening for single leg stance balance. Also worked on scapular strengthening to decrease upper trap tension to neck and decrease dizziness associated with neck stiffness.  Pt tolerated session well without aggravation of symptoms. Pt will benefit from continued skilled physical therapy services to improve strength, balance and function.        PT Short Term Goals - 07/02/21 1559       PT SHORT TERM GOAL #1   Title Pt will be independent with her initial HEP to improve strengh, balance and decrease fall risk.    Baseline Pt has started her HEP. (07/02/2021)    Time 3    Period Weeks    Status New    Target Date 07/24/21               PT Long Term Goals - 07/02/21 1602       PT LONG TERM GOAL #1   Title Pt will improve her DGI score by at least 3 points as a demonstration of decreased fall risk.    Baseline DGI score 19 (07/02/2021)    Time 8    Period Weeks  Status New    Target Date 08/28/21      PT LONG TERM GOAL #2   Title Pt will improve  bilateral hip extension and abduction strength to promote ability to negotiate obstacles with less difficulty.    Baseline Hip extension 4/5 R, 4-/5 L, Hip abduction 4/5 R, 4/5 L (07/02/2021)    Time 8    Period Weeks    Status New    Target Date 08/28/21      PT LONG TERM GOAL #3   Title Pt will improve her FOTO score by at least 10 points as a demonstration of improved function.    Baseline Upper leg FOTO 47 (07/02/2021)    Time 8    Period Weeks    Status New    Target Date 08/28/21      PT LONG TERM GOAL #4   Title Pt will improve her 6 minute walk distance to 1000 ft to promote mobility at her home.    Baseline 6 minute walk distace 879 ft (07/02/2021)    Time 8    Period Weeks    Status New    Target Date 08/28/21                   Plan - 07/23/21 1540     Clinical Impression Statement Pt arrived late so session was adjusted accordingly. No dizziness reported during session. Worked on end range knee extension quad strengthening as well a glute strengthening for single leg stance balance. Also worked on scapular strengthening to decrease upper trap tension to neck and decrease dizziness associated with neck stiffness.  Pt tolerated session well without aggravation of symptoms. Pt will benefit from continued skilled physical therapy services to improve strength, balance and function.    Personal Factors and Comorbidities Comorbidity 3+;Age;Fitness;Past/Current Experience    Comorbidities HTN, hx of CA, COPD, depression, OA, osteopenia, PAD    Examination-Activity Limitations Locomotion Level;Transfers;Stairs    Stability/Clinical Decision Making Stable/Uncomplicated    Rehab Potential Fair    PT Frequency 2x / week    PT Duration 8 weeks    PT Treatment/Interventions Therapeutic activities;Therapeutic exercise;Balance training;Neuromuscular re-education;Patient/family education;Manual techniques;Dry needling;Aquatic Therapy;Canalith Repostioning;Electrical  Stimulation;Iontophoresis 4mg /ml Dexamethasone;Gait training;Stair training;Functional mobility training;Vestibular    PT Next Visit Plan Thoracic extension, posture, trunk, glute strengthening, femoral control, balance, manual techniques, modalities PRN    Consulted and Agree with Plan of Care Patient             Patient will benefit from skilled therapeutic intervention in order to improve the following deficits and impairments:  Pain, Postural dysfunction, Improper body mechanics, Difficulty walking, Decreased strength, Decreased balance, Decreased endurance  Visit Diagnosis: History of falling  Muscle weakness (generalized)     Problem List Patient Active Problem List   Diagnosis Date Noted   S/P TKR (total knee replacement) using cement, bilateral 09/19/2020   TIA (transient ischemic attack) 01/03/2020   Migraine headache with aura 01/03/2020   Essential hypertension 01/03/2020   Depression 01/03/2020   COPD with chronic bronchitis ([place]) 01/03/2020   Hypokalemia 01/03/2020   SOB (shortness of breath) 04/30/2014   Leg swelling 04/30/2014   Bronchiectasis without complication ([place]) 94/58/5929   History of DVT (deep vein thrombosis) 04/30/2014   Labile blood pressure 04/30/2014   S/P IVC filter 04/30/2014   History of right knee surgery 04/30/2014   Joneen Boers PT, DPT   07/23/2021, 7:56 PM  [facility] [address], [place], [address] Phone: [phone]   Fax:  [phone]  Name: Lindsey [name] MRN: [id number] Date of Birth: May 11, 1936

## 2021-07-28 ENCOUNTER — Ambulatory Visit: Payer: PPO

## 2021-07-28 DIAGNOSIS — H353211 Exudative age-related macular degeneration, right eye, with active choroidal neovascularization: Secondary | ICD-10-CM | POA: Diagnosis not present

## 2021-07-30 ENCOUNTER — Ambulatory Visit: Payer: PPO

## 2021-08-04 ENCOUNTER — Other Ambulatory Visit: Payer: Self-pay | Admitting: Cardiovascular Disease

## 2021-08-04 ENCOUNTER — Ambulatory Visit: Payer: PPO

## 2021-08-04 DIAGNOSIS — Z9181 History of falling: Secondary | ICD-10-CM

## 2021-08-04 DIAGNOSIS — M6281 Muscle weakness (generalized): Secondary | ICD-10-CM

## 2021-08-04 NOTE — Telephone Encounter (Signed)
Please schedule overdue F/U appointment. Thank you! ?

## 2021-08-04 NOTE — Therapy (Signed)
[facility] [address], [place], [address] Phone: [phone]   Fax:  [phone]  Physical Therapy Treatment  Patient Details  Name: Lindsey [name] MRN: [id number] Date of Birth: [birth year] Referring Provider (PT): Vladimir Crofts, MD   Encounter Date: 08/04/2021   PT End of Session - 08/04/21 1201     Visit Number 7    Number of Visits 17    Date for PT Re-Evaluation 08/28/21    Authorization Type 7    Authorization Time Period 10    PT Start Time 1201    PT Stop Time 1232    PT Time Calculation (min) 31 min    Activity Tolerance Patient tolerated treatment well    Behavior During Therapy [facility] for tasks assessed/performed             Past Medical History:  Diagnosis Date   Anemia    Breast cancer ([place])    Bronchiectasis ([place]) 06/2007   Cancer ([place])    Colitis    COPD (chronic obstructive pulmonary disease) ([place])    Depression    DVT (deep venous thrombosis) ([place])    after her right knee surgery.    Fibromyalgia    Fibromyalgia    Hypertension    Migraine headache with aura    Osteoarthritis    Osteoporosis    PAD (peripheral artery disease) ([place])    Pneumonia    Polio 1952   Stenosis of carotid artery     Past Surgical History:  Procedure Laterality Date   ABDOMINAL HYSTERECTOMY     BACK SURGERY     BILATERAL TOTAL MASTECTOMY WITH AXILLARY LYMPH NODE DISSECTION     BREAST SURGERY     CARPAL TUNNEL RELEASE     bilateral    COLONOSCOPY WITH PROPOFOL N/A 08/02/2015   Procedure: COLONOSCOPY WITH PROPOFOL;  Surgeon: Manya Silvas, MD;  Location: [facility] ENDOSCOPY;  Service: Endoscopy;  Laterality: N/A;   FINGER TENDON REPAIR     HERNIA REPAIR     REPLACEMENT TOTAL KNEE     right knee    SQUAMOUS CELL CARCINOMA EXCISION     TONSILLECTOMY     TOTAL KNEE ARTHROPLASTY Left 09/19/2020   Procedure: TOTAL KNEE ARTHROPLASTY;  Surgeon: Hessie Knows, MD;  Location: [facility] ORS;   Service: Orthopedics;  Laterality: Left;   UMBILICAL HERNIA REPAIR     VAGINAL HYSTERECTOMY      There were no vitals filed for this visit.   Subjective Assessment - 08/04/21 1202     Subjective Doing ok. Still has her dizziness. Not as bad as it was last week. The room does not look like its moving. Meclizine helps. R > L ears feel blocked. No cold recently but ran out of Flonase about a week ago and the congestion got a little worse.    Pertinent History Falls, balance. Had L TKA 09/2020. Has not been as steady as she would like since then. Also went through L knee PT at the [facility] (pt place of residence). Pt fell May 01, 2021 (2 months ago). Pt was on her way into the eye center. There was an island (mini hump shape)  in the parking lot and pt walked onto [employer] (pt was walking up the mini hump)  and fell backwards to her L side and hit her L elbow, knee, and L head above her ear onto the pavement. Did not break anything. Had  an MRI for her head 3 weeks later which was negative. Pt currently has L masseter muscle pain since the fall (pain when opening her mouth wide. Has not had a fall in 3 years except for this one.  Does not feel as strong as she needs to be and feels it when she tries to walk any distance. Gets tired walking beyond her mailbox (60 ft away) and then her legs feel heavy. Sometimes uses a rollator. Has a long walk to get to dinner and therefore uses her car.    Patient Stated Goals Get stronger upper body and lower body.    Currently in Pain? No/denies                                        PT Education - 08/04/21 1245     Education Details ther-ex    Person(s) Educated Patient    Methods Explanation;Demonstration;Tactile cues;Verbal cues    Comprehension Returned demonstration;Verbalized understanding           Objective      No latex allergies Blood pressure is labile but pills control it.   Had MILD procedure in  low back 2 years ago, no laminectomy, no fusion.    Sit <> stand: B genu vaigus/decreased femoral control L > R     TTP L greater trochanter   Standing posture: L lateral shift, L pelvis anterior tilt   Medbridge Access Code [email or web address]   Therapeutic exercise   Dix Hallpike   L: lights blinking which went away  R: negative   Epley Maneuver  L 2x  Neck discomfort during posoitions, manual therapy performed  No change in feeling of uneasiness with mat table to chair transfers   Seated manually resisted scapular retraction targetint lower trap muscles   R 10x2 with 5 second holds  L 10x2 with 5 second holds    L weaker than R      Improved exercise technique, movement at target joints, use of target muscles after mod verbal, visual, tactile cues.    Manual therapy   Seated STM cervical paraspinal and upper trap muscles to decrease tension                Response to treatment Pt tolerated session well without aggravation of symptoms.       Clinical impression   Pt arrived late so session was adjusted accordingly.  Inconclusive [employer] pike, possible positive test for L side. Performed Epley maneuver 2x for L side with minimal to no change reported. Continued working on decreasing muscle stiffness to neck and upper trap area as well as improving lower trap muscle strength. Pt demonstrates weak scapular strength L > R. Minimal to no change in dizziness reported after session. Pt tolerated session well without aggravation of symptoms. Pt will benefit from continued skilled physical therapy services to improve strength, balance and function.        PT Short Term Goals - 07/02/21 1559       PT SHORT TERM GOAL #1   Title Pt will be independent with her initial HEP to improve strengh, balance and decrease fall risk.    Baseline Pt has started her HEP. (07/02/2021)    Time 3    Period Weeks    Status New    Target Date 07/24/21  PT Long Term Goals  - 07/02/21 1602       PT LONG TERM GOAL #1   Title Pt will improve her DGI score by at least 3 points as a demonstration of decreased fall risk.    Baseline DGI score 19 (07/02/2021)    Time 8    Period Weeks    Status New    Target Date 08/28/21      PT LONG TERM GOAL #2   Title Pt will improve bilateral hip extension and abduction strength to promote ability to negotiate obstacles with less difficulty.    Baseline Hip extension 4/5 R, 4-/5 L, Hip abduction 4/5 R, 4/5 L (07/02/2021)    Time 8    Period Weeks    Status New    Target Date 08/28/21      PT LONG TERM GOAL #3   Title Pt will improve her FOTO score by at least 10 points as a demonstration of improved function.    Baseline Upper leg FOTO 47 (07/02/2021)    Time 8    Period Weeks    Status New    Target Date 08/28/21      PT LONG TERM GOAL #4   Title Pt will improve her 6 minute walk distance to 1000 ft to promote mobility at her home.    Baseline 6 minute walk distace 879 ft (07/02/2021)    Time 8    Period Weeks    Status New    Target Date 08/28/21                   Plan - 08/04/21 1244     Clinical Impression Statement Pt arrived late so session was adjusted accordingly.  Inconclusive [employer] pike, possible positive test for L side. Performed Epley maneuver 2x for L side with minimal to no change reported. Continued working on decreasing muscle stiffness to neck and upper trap area as well as improving lower trap muscle strength. Pt demonstrates weak scapular strength L > R. Minimal to no change in dizziness reported after session. Pt tolerated session well without aggravation of symptoms. Pt will benefit from continued skilled physical therapy services to improve strength, balance and function.    Personal Factors and Comorbidities Comorbidity 3+;Age;Fitness;Past/Current Experience    Comorbidities HTN, hx of CA, COPD, depression, OA, osteopenia, PAD    Examination-Activity Limitations Locomotion  Level;Transfers;Stairs    Stability/Clinical Decision Making Stable/Uncomplicated    Clinical Decision Making Low    Rehab Potential Fair    PT Frequency 2x / week    PT Duration 8 weeks    PT Treatment/Interventions Therapeutic activities;Therapeutic exercise;Balance training;Neuromuscular re-education;Patient/family education;Manual techniques;Dry needling;Aquatic Therapy;Canalith Repostioning;Electrical Stimulation;Iontophoresis 4mg /ml Dexamethasone;Gait training;Stair training;Functional mobility training;Vestibular    PT Next Visit Plan Thoracic extension, posture, trunk, glute strengthening, femoral control, balance, manual techniques, modalities PRN    Consulted and Agree with Plan of Care Patient             Patient will benefit from skilled therapeutic intervention in order to improve the following deficits and impairments:  Pain, Postural dysfunction, Improper body mechanics, Difficulty walking, Decreased strength, Decreased balance, Decreased endurance  Visit Diagnosis: History of falling  Muscle weakness (generalized)     Problem List Patient Active Problem List   Diagnosis Date Noted   S/P TKR (total knee replacement) using cement, bilateral 09/19/2020   TIA (transient ischemic attack) 01/03/2020   Migraine headache with aura 01/03/2020   Essential hypertension 01/03/2020  Depression 01/03/2020   COPD with chronic bronchitis ([place]) 01/03/2020   Hypokalemia 01/03/2020   SOB (shortness of breath) 04/30/2014   Leg swelling 04/30/2014   Bronchiectasis without complication ([place]) 55/73/2202   History of DVT (deep vein thrombosis) 04/30/2014   Labile blood pressure 04/30/2014   S/P IVC filter 04/30/2014   History of right knee surgery 04/30/2014    Joneen Boers PT, DPT   08/04/2021, 12:45 PM  [facility] [address], [place], [address] Phone: [phone]   Fax:  [phone]  Name:  Lindsey [name] MRN: [id number] Date of Birth: 1936/04/06

## 2021-08-06 ENCOUNTER — Ambulatory Visit: Payer: PPO

## 2021-08-06 DIAGNOSIS — R42 Dizziness and giddiness: Secondary | ICD-10-CM | POA: Diagnosis not present

## 2021-08-08 DIAGNOSIS — M810 Age-related osteoporosis without current pathological fracture: Secondary | ICD-10-CM | POA: Diagnosis not present

## 2021-08-08 DIAGNOSIS — J019 Acute sinusitis, unspecified: Secondary | ICD-10-CM | POA: Diagnosis not present

## 2021-08-08 DIAGNOSIS — B9689 Other specified bacterial agents as the cause of diseases classified elsewhere: Secondary | ICD-10-CM | POA: Diagnosis not present

## 2021-08-08 DIAGNOSIS — J479 Bronchiectasis, uncomplicated: Secondary | ICD-10-CM | POA: Diagnosis not present

## 2021-08-11 ENCOUNTER — Ambulatory Visit: Payer: PPO | Attending: Neurology

## 2021-08-11 DIAGNOSIS — Z9181 History of falling: Secondary | ICD-10-CM | POA: Diagnosis not present

## 2021-08-11 DIAGNOSIS — M6281 Muscle weakness (generalized): Secondary | ICD-10-CM | POA: Insufficient documentation

## 2021-08-11 NOTE — Therapy (Signed)
[facility] [address], [place], [address] Phone: [phone]   Fax:  [phone]  Physical Therapy Treatment  Patient Details  Name: Lindsey [name] MRN: [id number] Date of Birth: 1935-10-31 Referring Provider (PT): Vladimir Crofts, MD   Encounter Date: 08/11/2021   PT End of Session - 08/11/21 1331     Visit Number 8    Number of Visits 17    Date for PT Re-Evaluation 08/28/21    Authorization Type 8    Authorization Time Period 10    PT Start Time 1331    PT Stop Time 1412    PT Time Calculation (min) 41 min    Activity Tolerance Patient tolerated treatment well    Behavior During Therapy [facility] for tasks assessed/performed             Past Medical History:  Diagnosis Date   Anemia    Breast cancer ([place])    Bronchiectasis ([place]) 06/2007   Cancer ([place])    Colitis    COPD (chronic obstructive pulmonary disease) ([place])    Depression    DVT (deep venous thrombosis) ([place])    after her right knee surgery.    Fibromyalgia    Fibromyalgia    Hypertension    Migraine headache with aura    Osteoarthritis    Osteoporosis    PAD (peripheral artery disease) ([place])    Pneumonia    Polio 1952   Stenosis of carotid artery     Past Surgical History:  Procedure Laterality Date   ABDOMINAL HYSTERECTOMY     BACK SURGERY     BILATERAL TOTAL MASTECTOMY WITH AXILLARY LYMPH NODE DISSECTION     BREAST SURGERY     CARPAL TUNNEL RELEASE     bilateral    COLONOSCOPY WITH PROPOFOL N/A 08/02/2015   Procedure: COLONOSCOPY WITH PROPOFOL;  Surgeon: Manya Silvas, MD;  Location: [facility] ENDOSCOPY;  Service: Endoscopy;  Laterality: N/A;   FINGER TENDON REPAIR     HERNIA REPAIR     REPLACEMENT TOTAL KNEE     right knee    SQUAMOUS CELL CARCINOMA EXCISION     TONSILLECTOMY     TOTAL KNEE ARTHROPLASTY Left 09/19/2020   Procedure: TOTAL KNEE ARTHROPLASTY;  Surgeon: Hessie Knows, MD;  Location: [facility] ORS;   Service: Orthopedics;  Laterality: Left;   UMBILICAL HERNIA REPAIR     VAGINAL HYSTERECTOMY      There were no vitals filed for this visit.   Subjective Assessment - 08/11/21 1333     Subjective Dizziness is a lot better. Doctor discovered that she had a sinus infection last week. Dizziness is decreasing when taking the antibiotic and prednisone.    Pertinent History Falls, balance. Had L TKA 09/2020. Has not been as steady as she would like since then. Also went through L knee PT at the [facility] (pt place of residence). Pt fell May 01, 2021 (2 months ago). Pt was on her way into the eye center. There was an island (mini hump shape)  in the parking lot and pt walked onto [employer] (pt was walking up the mini hump)  and fell backwards to her L side and hit her L elbow, knee, and L head above her ear onto the pavement. Did not break anything. Had an MRI for her head 3 weeks later which was negative. Pt currently has L masseter muscle pain since the fall (pain when opening her  mouth wide. Has not had a fall in 3 years except for this one.  Does not feel as strong as she needs to be and feels it when she tries to walk any distance. Gets tired walking beyond her mailbox (60 ft away) and then her legs feel heavy. Sometimes uses a rollator. Has a long walk to get to dinner and therefore uses her car.    Patient Stated Goals Get stronger upper body and lower body.    Currently in Pain? No/denies                                        PT Education - 08/11/21 1649     Education Details ther-ex    Person(s) Educated Patient    Methods Explanation;Demonstration;Tactile cues;Verbal cues    Comprehension Returned demonstration;Verbalized understanding           Objective      No latex allergies Blood pressure is labile but pills control it.   Had MILD procedure in low back 2 years ago, no laminectomy, no fusion.    Sit <> stand: B genu vaigus/decreased  femoral control L > R     TTP L greater trochanter   Medbridge Access Code [email or web address]   Therapeutic exercise   Standing hip abduction with B UE assist   R 2x5 with 5 second holds   L 2x5 with 5 second holds   Stepping over 4 mini hurdles, CGA and one UE PRN  2x2. Unsteady with single leg stance balance. Also got dizzy    Standing B scapular retraction 10x5 seconds for 3 sets  SLS with contralateral UE assist   R 10x5 seconds for 2 sets  L 10x5 seconds for 2 sets  Good glute muscle work felt  Standing forward weight shifting, heels on floor with UE assist PRN  10x5 seconds    Improved exercise technique, movement at target joints, use of target muscles after mod verbal, visual, tactile cues.      Manual therapy  Seated STM cervical paraspinal and upper trap muscles to decrease tension                 Response to treatment Pt tolerated session well without aggravatino of symotoims.      Clinical impression Decreased dizziness reported after starting her antibiotics and steriod for her sinus infection. Worked on improving glute strength, as well as ability to place and maintain her center of gravity over base of support to improve balance. Pt tolerated session well without aggravation of symptoms. Pt will benefit from continued skilled physical therapy services to decrease pain, improve strength, balance and function.        PT Short Term Goals - 07/02/21 1559       PT SHORT TERM GOAL #1   Title Pt will be independent with her initial HEP to improve strengh, balance and decrease fall risk.    Baseline Pt has started her HEP. (07/02/2021)    Time 3    Period Weeks    Status New    Target Date 07/24/21               PT Long Term Goals - 07/02/21 1602       PT LONG TERM GOAL #1   Title Pt will improve her DGI score by at least 3 points as a demonstration of decreased fall risk.  Baseline DGI score 19 (07/02/2021)    Time 8    Period Weeks    Status  New    Target Date 08/28/21      PT LONG TERM GOAL #2   Title Pt will improve bilateral hip extension and abduction strength to promote ability to negotiate obstacles with less difficulty.    Baseline Hip extension 4/5 R, 4-/5 L, Hip abduction 4/5 R, 4/5 L (07/02/2021)    Time 8    Period Weeks    Status New    Target Date 08/28/21      PT LONG TERM GOAL #3   Title Pt will improve her FOTO score by at least 10 points as a demonstration of improved function.    Baseline Upper leg FOTO 47 (07/02/2021)    Time 8    Period Weeks    Status New    Target Date 08/28/21      PT LONG TERM GOAL #4   Title Pt will improve her 6 minute walk distance to 1000 ft to promote mobility at her home.    Baseline 6 minute walk distace 879 ft (07/02/2021)    Time 8    Period Weeks    Status New    Target Date 08/28/21                   Plan - 08/11/21 1331     Personal Factors and Comorbidities Comorbidity 3+;Age;Fitness;Past/Current Experience    Comorbidities HTN, hx of CA, COPD, depression, OA, osteopenia, PAD    Examination-Activity Limitations Locomotion Level;Transfers;Stairs    Stability/Clinical Decision Making Stable/Uncomplicated    Rehab Potential Fair    PT Frequency 2x / week    PT Duration 8 weeks    PT Treatment/Interventions Therapeutic activities;Therapeutic exercise;Balance training;Neuromuscular re-education;Patient/family education;Manual techniques;Dry needling;Aquatic Therapy;Canalith Repostioning;Electrical Stimulation;Iontophoresis 4mg /ml Dexamethasone;Gait training;Stair training;Functional mobility training;Vestibular    PT Next Visit Plan Thoracic extension, posture, trunk, glute strengthening, femoral control, balance, manual techniques, modalities PRN    Consulted and Agree with Plan of Care Patient             Patient will benefit from skilled therapeutic intervention in order to improve the following deficits and impairments:  Pain, Postural dysfunction,  Improper body mechanics, Difficulty walking, Decreased strength, Decreased balance, Decreased endurance  Visit Diagnosis: History of falling  Muscle weakness (generalized)     Problem List Patient Active Problem List   Diagnosis Date Noted   S/P TKR (total knee replacement) using cement, bilateral 09/19/2020   TIA (transient ischemic attack) 01/03/2020   Migraine headache with aura 01/03/2020   Essential hypertension 01/03/2020   Depression 01/03/2020   COPD with chronic bronchitis ([place]) 01/03/2020   Hypokalemia 01/03/2020   SOB (shortness of breath) 04/30/2014   Leg swelling 04/30/2014   Bronchiectasis without complication ([place]) 64/15/8309   History of DVT (deep vein thrombosis) 04/30/2014   Labile blood pressure 04/30/2014   S/P IVC filter 04/30/2014   History of right knee surgery 04/30/2014    Lanique Gonzalo, PT 08/11/2021, 4:49 PM  [facility] [address] [place], [address] Phone: [phone]   Fax:  [phone]  Name: MIYANNA WIERSMA MRN: [id number] Date of Birth: 10-Jun-1936

## 2021-08-14 ENCOUNTER — Ambulatory Visit: Payer: PPO

## 2021-08-14 DIAGNOSIS — M6281 Muscle weakness (generalized): Secondary | ICD-10-CM

## 2021-08-14 DIAGNOSIS — Z9181 History of falling: Secondary | ICD-10-CM | POA: Diagnosis not present

## 2021-08-14 NOTE — Therapy (Signed)
[facility] [address], [place], [address] Phone: [phone]   Fax:  [phone]  Physical Therapy Treatment  Patient Details  Name: Lindsey [name] MRN: [id number] Date of Birth: [birth year] Referring Provider (PT): Vladimir Crofts, MD   Encounter Date: 08/14/2021   PT End of Session - 08/14/21 1426     Visit Number 9    Number of Visits 17    Date for PT Re-Evaluation 08/28/21    Authorization Type 9    Authorization Time Period 10    PT Start Time 1426    PT Stop Time 1454    PT Time Calculation (min) 28 min    Activity Tolerance Patient tolerated treatment well    Behavior During Therapy [facility] for tasks assessed/performed             Past Medical History:  Diagnosis Date   Anemia    Breast cancer ([place])    Bronchiectasis ([place]) 06/2007   Cancer ([place])    Colitis    COPD (chronic obstructive pulmonary disease) ([place])    Depression    DVT (deep venous thrombosis) ([place])    after her right knee surgery.    Fibromyalgia    Fibromyalgia    Hypertension    Migraine headache with aura    Osteoarthritis    Osteoporosis    PAD (peripheral artery disease) ([place])    Pneumonia    Polio 1952   Stenosis of carotid artery     Past Surgical History:  Procedure Laterality Date   ABDOMINAL HYSTERECTOMY     BACK SURGERY     BILATERAL TOTAL MASTECTOMY WITH AXILLARY LYMPH NODE DISSECTION     BREAST SURGERY     CARPAL TUNNEL RELEASE     bilateral    COLONOSCOPY WITH PROPOFOL N/A 08/02/2015   Procedure: COLONOSCOPY WITH PROPOFOL;  Surgeon: Manya Silvas, MD;  Location: [facility] ENDOSCOPY;  Service: Endoscopy;  Laterality: N/A;   FINGER TENDON REPAIR     HERNIA REPAIR     REPLACEMENT TOTAL KNEE     right knee    SQUAMOUS CELL CARCINOMA EXCISION     TONSILLECTOMY     TOTAL KNEE ARTHROPLASTY Left 09/19/2020   Procedure: TOTAL KNEE ARTHROPLASTY;  Surgeon: Hessie Knows, MD;  Location: [facility] ORS;   Service: Orthopedics;  Laterality: Left;   UMBILICAL HERNIA REPAIR     VAGINAL HYSTERECTOMY      There were no vitals filed for this visit.   Subjective Assessment - 08/14/21 1428     Subjective Doing well today. Had a tough water aerobic workout this morning. Has not been dizzy today so far. Was dizzy yesterday a couple of times. Less than before, the dizziness is definitely diminishing. Tired from having a busy day    Pertinent History Falls, balance. Had L TKA 09/2020. Has not been as steady as she would like since then. Also went through L knee PT at the [facility] (pt place of residence). Pt fell May 01, 2021 (2 months ago). Pt was on her way into the eye center. There was an island (mini hump shape)  in the parking lot and pt walked onto [employer] (pt was walking up the mini hump)  and fell backwards to her L side and hit her L elbow, knee, and L head above her ear onto the pavement. Did not break anything. Had an MRI for her head 3 weeks later which was  negative. Pt currently has L masseter muscle pain since the fall (pain when opening her mouth wide. Has not had a fall in 3 years except for this one.  Does not feel as strong as she needs to be and feels it when she tries to walk any distance. Gets tired walking beyond her mailbox (60 ft away) and then her legs feel heavy. Sometimes uses a rollator. Has a long walk to get to dinner and therefore uses her car.    Patient Stated Goals Get stronger upper body and lower body.    Currently in Pain? No/denies                                        PT Education - 08/14/21 1432     Education Details ther-ex    Person(s) Educated Patient    Methods Explanation;Demonstration;Tactile cues;Verbal cues    Comprehension Returned demonstration;Verbalized understanding             Objective      No latex allergies Blood pressure is labile but pills control it.   Had MILD procedure in low back 2  years ago, no laminectomy, no fusion.    Sit <> stand: B genu vaigus/decreased femoral control L > R     TTP L greater trochanter   Medbridge Access Code [email or web address]   Therapeutic exercise   Standing hip abduction with B UE assist              R 10x3             L 10x3   SLS with contralateral UE assist              R 10x5 seconds for 2 sets             L 10x5 seconds for 2 sets            Standing forward weight shifting, heels on floor with UE assist PRN             10x5 seconds  Then with eyes closed 10x5 seconds for 2 sets  Stepping over 4 mini hurdles, CGA and one UE PRN            4x2  Better able to weight shift onto front foot and maintain her center of gravity on base of support      Improved exercise technique, movement at target joints, use of target muscles after mod verbal, visual, tactile cues.                     Response to treatment Pt tolerated session well without aggravatino of symotoims.      Clinical impression Lighter session today secondary to pt tired from having a busy day with water aerobics. Significant decrease in dizziness since her sinus infection was treated. Improving ability to weight shift onto front foot and maintain her center of gravity over her base of support when stepping over mini hurdle obstacle. Pt tolerated session well without aggravation of symptoms. Pt will benefit from continued skilled physical therapy services to decrease pain, improve strength, balance and function.          PT Short Term Goals - 07/02/21 1559       PT SHORT TERM GOAL #1   Title Pt will be independent with her initial HEP to improve strengh, balance and decrease fall  risk.    Baseline Pt has started her HEP. (07/02/2021)    Time 3    Period Weeks    Status New    Target Date 07/24/21               PT Long Term Goals - 07/02/21 1602       PT LONG TERM GOAL #1   Title Pt will improve her DGI score by at least 3 points as a demonstration  of decreased fall risk.    Baseline DGI score 19 (07/02/2021)    Time 8    Period Weeks    Status New    Target Date 08/28/21      PT LONG TERM GOAL #2   Title Pt will improve bilateral hip extension and abduction strength to promote ability to negotiate obstacles with less difficulty.    Baseline Hip extension 4/5 R, 4-/5 L, Hip abduction 4/5 R, 4/5 L (07/02/2021)    Time 8    Period Weeks    Status New    Target Date 08/28/21      PT LONG TERM GOAL #3   Title Pt will improve her FOTO score by at least 10 points as a demonstration of improved function.    Baseline Upper leg FOTO 47 (07/02/2021)    Time 8    Period Weeks    Status New    Target Date 08/28/21      PT LONG TERM GOAL #4   Title Pt will improve her 6 minute walk distance to 1000 ft to promote mobility at her home.    Baseline 6 minute walk distace 879 ft (07/02/2021)    Time 8    Period Weeks    Status New    Target Date 08/28/21                   Plan - 08/14/21 1432     Clinical Impression Statement Lighter session today secondary to pt tired from having a busy day with water aerobics. Significant decrease in dizziness since her sinus infection was treated. Improving ability to weight shift onto front foot and maintain her center of gravity over her base of support when stepping over mini hurdle obstacle. Pt tolerated session well without aggravation of symptoms. Pt will benefit from continued skilled physical therapy services to decrease pain, improve strength, balance and function.    Personal Factors and Comorbidities Comorbidity 3+;Age;Fitness;Past/Current Experience    Comorbidities HTN, hx of CA, COPD, depression, OA, osteopenia, PAD    Examination-Activity Limitations Locomotion Level;Transfers;Stairs    Stability/Clinical Decision Making Stable/Uncomplicated    Clinical Decision Making Low    Rehab Potential Fair    PT Frequency 2x / week    PT Duration 8 weeks    PT Treatment/Interventions  Therapeutic activities;Therapeutic exercise;Balance training;Neuromuscular re-education;Patient/family education;Manual techniques;Dry needling;Aquatic Therapy;Canalith Repostioning;Electrical Stimulation;Iontophoresis 4mg /ml Dexamethasone;Gait training;Stair training;Functional mobility training;Vestibular    PT Next Visit Plan Thoracic extension, posture, trunk, glute strengthening, femoral control, balance, manual techniques, modalities PRN    Consulted and Agree with Plan of Care Patient             Patient will benefit from skilled therapeutic intervention in order to improve the following deficits and impairments:  Pain, Postural dysfunction, Improper body mechanics, Difficulty walking, Decreased strength, Decreased balance, Decreased endurance  Visit Diagnosis: History of falling  Muscle weakness (generalized)     Problem List Patient Active Problem List   Diagnosis Date Noted   S/P  TKR (total knee replacement) using cement, bilateral 09/19/2020   TIA (transient ischemic attack) 01/03/2020   Migraine headache with aura 01/03/2020   Essential hypertension 01/03/2020   Depression 01/03/2020   COPD with chronic bronchitis ([place]) 01/03/2020   Hypokalemia 01/03/2020   SOB (shortness of breath) 04/30/2014   Leg swelling 04/30/2014   Bronchiectasis without complication ([place]) 59/47/0761   History of DVT (deep vein thrombosis) 04/30/2014   Labile blood pressure 04/30/2014   S/P IVC filter 04/30/2014   History of right knee surgery 04/30/2014    Miciah Covelli, PT 08/14/2021, 3:08 PM  [place] [facility] [facility] [address], [place], [address] Phone: [phone]   Fax:  [phone]  Name: Lindsey [name] MRN: [id number] Date of Birth: 06/27/1936

## 2021-08-15 DIAGNOSIS — R42 Dizziness and giddiness: Secondary | ICD-10-CM | POA: Diagnosis not present

## 2021-08-15 DIAGNOSIS — G43119 Migraine with aura, intractable, without status migrainosus: Secondary | ICD-10-CM | POA: Diagnosis not present

## 2021-08-15 DIAGNOSIS — R4189 Other symptoms and signs involving cognitive functions and awareness: Secondary | ICD-10-CM | POA: Diagnosis not present

## 2021-08-18 ENCOUNTER — Ambulatory Visit: Payer: PPO

## 2021-08-18 DIAGNOSIS — M6281 Muscle weakness (generalized): Secondary | ICD-10-CM

## 2021-08-18 DIAGNOSIS — Z9181 History of falling: Secondary | ICD-10-CM

## 2021-08-18 NOTE — Therapy (Signed)
[facility] [facility] [address], [place], [address] Phone: [phone]   Fax:  [phone]  Physical Therapy Treatment And Progress Report (07/02/2021 - 08/18/2021)  Patient Details  Name: Lindsey [name] MRN: [id number] Date of Birth: 11/30/1935 Referring Provider (PT): Vladimir Crofts, MD   Encounter Date: 08/18/2021   PT End of Session - 08/18/21 1333     Visit Number 10    Number of Visits 17    Date for PT Re-Evaluation 08/28/21    Authorization Type 10    Authorization Time Period 10    PT Start Time 1333    PT Stop Time 1414    PT Time Calculation (min) 41 min    Activity Tolerance Patient tolerated treatment well    Behavior During Therapy [facility] for tasks assessed/performed             Past Medical History:  Diagnosis Date   Anemia    Breast cancer ([place])    Bronchiectasis ([place]) 06/2007   Cancer ([place])    Colitis    COPD (chronic obstructive pulmonary disease) ([place])    Depression    DVT (deep venous thrombosis) ([place])    after her right knee surgery.    Fibromyalgia    Fibromyalgia    Hypertension    Migraine headache with aura    Osteoarthritis    Osteoporosis    PAD (peripheral artery disease) ([place])    Pneumonia    Polio 1952   Stenosis of carotid artery     Past Surgical History:  Procedure Laterality Date   ABDOMINAL HYSTERECTOMY     BACK SURGERY     BILATERAL TOTAL MASTECTOMY WITH AXILLARY LYMPH NODE DISSECTION     BREAST SURGERY     CARPAL TUNNEL RELEASE     bilateral    COLONOSCOPY WITH PROPOFOL N/A 08/02/2015   Procedure: COLONOSCOPY WITH PROPOFOL;  Surgeon: Manya Silvas, MD;  Location: [facility] ENDOSCOPY;  Service: Endoscopy;  Laterality: N/A;   FINGER TENDON REPAIR     HERNIA REPAIR     REPLACEMENT TOTAL KNEE     right knee    SQUAMOUS CELL CARCINOMA EXCISION     TONSILLECTOMY     TOTAL KNEE ARTHROPLASTY Left 09/19/2020   Procedure: TOTAL KNEE ARTHROPLASTY;   Surgeon: Hessie Knows, MD;  Location: [facility] ORS;  Service: Orthopedics;  Laterality: Left;   UMBILICAL HERNIA REPAIR     VAGINAL HYSTERECTOMY      There were no vitals filed for this visit.   Subjective Assessment - 08/18/21 1335     Subjective The dizziness is good. Just gets dizzy every once in a while when she gets congested.    Pertinent History Falls, balance. Had L TKA 09/2020. Has not been as steady as she would like since then. Also went through L knee PT at the [facility] (pt place of residence). Pt fell May 01, 2021 (2 months ago). Pt was on her way into the eye center. There was an island (mini hump shape)  in the parking lot and pt walked onto [employer] (pt was walking up the mini hump)  and fell backwards to her L side and hit her L elbow, knee, and L head above her ear onto the pavement. Did not break anything. Had an MRI for her head 3 weeks later which was negative. Pt currently has L masseter muscle pain since the fall (pain when opening her mouth wide.  Has not had a fall in 3 years except for this one.  Does not feel as strong as she needs to be and feels it when she tries to walk any distance. Gets tired walking beyond her mailbox (60 ft away) and then her legs feel heavy. Sometimes uses a rollator. Has a long walk to get to dinner and therefore uses her car.    Patient Stated Goals Get stronger upper body and lower body.    Currently in Pain? No/denies                [facility] PT Assessment - 08/18/21 1338       Dynamic Gait Index   Level Surface Normal    Change in Gait Speed Normal    Gait with Horizontal Head Turns Normal    Gait with Vertical Head Turns Mild Impairment    Gait and Pivot Turn Normal    Step Over Obstacle Mild Impairment    Step Around Obstacles Normal    Steps Mild Impairment    Total Score 21    DGI comment: < 19 suggests increased risk for falls.                                    PT Education - 08/18/21  1346     Education Details ther-ex, progress/current status    Person(s) Educated Patient    Methods Explanation;Tactile cues;Demonstration;Verbal cues    Comprehension Returned demonstration;Verbalized understanding              Objective      No latex allergies Blood pressure is labile but pills control it.   Had MILD procedure in low back 2 years ago, no laminectomy, no fusion.    Sit <> stand: B genu vaigus/decreased femoral control L > R     TTP L greater trochanter   Medbridge Access Code [id number]   Therapeutic exercise   Directed patient with gait with normal gait speed, with changes in speed, 180 degree pivot turn, with R and L cervical rotation position, with cervical flexion and extension position, stepping around obstacles, stepping over an obstacle, ascending and descending 4 regular steps with UE assist    Prone manually resisted hip extension and S/L  abductoin  Hip extension 4/5 R, 4/5 L; hip abduction 4/5 R, 4/5 L  Reviewed progress/current status with PT towards goals   Gait x 6 minutes  1000 ft  Stepping over 4 mini hurdles, CGA and one UE PRN            4x2           Able to perform steadily today without UE assist.   SLS with contralateral UE assist              R 10x5 seconds for 2 sets             L 10x5 seconds for 2 sets    Improved exercise technique, movement at target joints, use of target muscles after mod verbal, visual, tactile cues.                     Response to treatment Pt tolerated session well without aggravation of symptoms.      Clinical impression Pt demonstrates improved balance, L hip extension strength, and endurance (able to ambulate 1000 ft in 6 minutes)  since initial evaluation. Dizziness has improved after taking  antibiotics for her sinus infection. Pt also demonstrates improved ability to negotiate obstacles. Pt making progress with PT towards goals. Pt will benefit from continued skilled physical therapy  services to decrease pain, improve strength, balance and function.           PT Short Term Goals - 08/18/21 1336       PT SHORT TERM GOAL #1   Title Pt will be independent with her initial HEP to improve strengh, balance and decrease fall risk.    Baseline Pt has started her HEP. (07/02/2021); no questions with her HEP (08/18/2021)    Time 3    Period Weeks    Status Achieved    Target Date 07/24/21               PT Long Term Goals - 08/18/21 1337       PT LONG TERM GOAL #1   Title Pt will improve her DGI score by at least 3 points as a demonstration of decreased fall risk.    Baseline DGI score 19 (07/02/2021); 21 (08/18/2021)    Time 8    Period Weeks    Status Partially Met    Target Date 08/28/21      PT LONG TERM GOAL #2   Title Pt will improve bilateral hip extension and abduction strength to promote ability to negotiate obstacles with less difficulty.    Baseline Hip extension 4/5 R, 4-/5 L, Hip abduction 4/5 R, 4/5 L (07/02/2021);Hip extension 4/5 R, 4/5 L; hip abduction 4/5 R, 4/5 L (08/18/2021)    Time 8    Period Weeks    Status Partially Met    Target Date 08/28/21      PT LONG TERM GOAL #3   Title Pt will improve her FOTO score by at least 10 points as a demonstration of improved function.    Baseline Upper leg FOTO 47 (07/02/2021); 63 (08/18/2021)    Time 8    Period Weeks    Status Achieved    Target Date 08/28/21      PT LONG TERM GOAL #4   Title Pt will improve her 6 minute walk distance to 1000 ft to promote mobility at her home.    Baseline 6 minute walk distace 879 ft (07/02/2021); 1000 ft (08/18/2021)    Time 8    Period Weeks    Status Achieved    Target Date 08/28/21                   Plan - 08/18/21 1354     Clinical Impression Statement Pt demonstrates improved balance, L hip extension strength, and endurance (able to ambulate 1000 ft in 6 minutes)  since initial evaluation. Dizziness has improved after taking antibiotics  for her sinus infection. Pt also demonstrates improved ability to negotiate obstacles. Pt making progress with PT towards goals. Pt will benefit from continued skilled physical therapy services to decrease pain, improve strength, balance and function.    Personal Factors and Comorbidities Comorbidity 3+;Age;Fitness;Past/Current Experience    Comorbidities HTN, hx of CA, COPD, depression, OA, osteopenia, PAD    Examination-Activity Limitations Locomotion Level;Transfers;Stairs    Stability/Clinical Decision Making Stable/Uncomplicated    Clinical Decision Making Low    Rehab Potential Fair    PT Frequency 2x / week    PT Duration 8 weeks    PT Treatment/Interventions Therapeutic activities;Therapeutic exercise;Balance training;Neuromuscular re-education;Patient/family education;Manual techniques;Dry needling;Aquatic Therapy;Canalith Repostioning;Electrical Stimulation;Iontophoresis 10m/ml Dexamethasone;Gait training;Stair training;Functional mobility training;Vestibular  PT Next Visit Plan Thoracic extension, posture, trunk, glute strengthening, femoral control, balance, manual techniques, modalities PRN    Consulted and Agree with Plan of Care Patient             Patient will benefit from skilled therapeutic intervention in order to improve the following deficits and impairments:  Pain, Postural dysfunction, Improper body mechanics, Difficulty walking, Decreased strength, Decreased balance, Decreased endurance  Visit Diagnosis: History of falling  Muscle weakness (generalized)     Problem List Patient Active Problem List   Diagnosis Date Noted   S/P TKR (total knee replacement) using cement, bilateral 09/19/2020   TIA (transient ischemic attack) 01/03/2020   Migraine headache with aura 01/03/2020   Essential hypertension 01/03/2020   Depression 01/03/2020   COPD with chronic bronchitis ([place]) 01/03/2020   Hypokalemia 01/03/2020   SOB (shortness of breath) 04/30/2014   Leg  swelling 04/30/2014   Bronchiectasis without complication ([place]) 64/33/2951   History of DVT (deep vein thrombosis) 04/30/2014   Labile blood pressure 04/30/2014   S/P IVC filter 04/30/2014   History of right knee surgery 04/30/2014    Thank you for your referral.  Joneen Boers PT, DPT   08/18/2021, 6:41 PM  [place] [facility] [address], [place], [address] Phone: [phone]   Fax:  [phone]  Name: Lindsey [name] MRN: [id number] Date of Birth: [birth year]

## 2021-08-20 ENCOUNTER — Ambulatory Visit: Payer: PPO

## 2021-08-20 DIAGNOSIS — Z9181 History of falling: Secondary | ICD-10-CM | POA: Diagnosis not present

## 2021-08-20 DIAGNOSIS — M6281 Muscle weakness (generalized): Secondary | ICD-10-CM

## 2021-08-20 NOTE — Therapy (Signed)
[facility] [address] [place], [place], [address] Phone: [phone]   Fax:  [phone]  Physical Therapy Treatment  Patient Details  Name: Lindsey [name] MRN: [id number] Date of Birth: 08/13/1936 Referring Provider (PT): Shah, Hemang K, MD   Encounter Date: 08/20/2021   PT End of Session - 08/20/21 1333     Visit Number 11    Number of Visits 17    Date for PT Re-Evaluation 08/28/21    Authorization Type 1    Authorization Time Period 10    PT Start Time 1333    PT Stop Time 1419    PT Time Calculation (min) 46 min    Activity Tolerance Patient tolerated treatment well    Behavior During Therapy WFL for tasks assessed/performed             Past Medical History:  Diagnosis Date   Anemia    Breast cancer (HCC)    Bronchiectasis (HCC) 06/2007   Cancer (HCC)    Colitis    COPD (chronic obstructive pulmonary disease) (HCC)    Depression    DVT (deep venous thrombosis) (HCC)    after her right knee surgery.    Fibromyalgia    Fibromyalgia    Hypertension    Migraine headache with aura    Osteoarthritis    Osteoporosis    PAD (peripheral artery disease) (HCC)    Pneumonia    Polio 1952   Stenosis of carotid artery     Past Surgical History:  Procedure Laterality Date   ABDOMINAL HYSTERECTOMY     BACK SURGERY     BILATERAL TOTAL MASTECTOMY WITH AXILLARY LYMPH NODE DISSECTION     BREAST SURGERY     CARPAL TUNNEL RELEASE     bilateral    COLONOSCOPY WITH PROPOFOL N/A 08/02/2015   Procedure: COLONOSCOPY WITH PROPOFOL;  Surgeon: Robert T Elliott, MD;  Location: [facility] ENDOSCOPY;  Service: Endoscopy;  Laterality: N/A;   FINGER TENDON REPAIR     HERNIA REPAIR     REPLACEMENT TOTAL KNEE     right knee    SQUAMOUS CELL CARCINOMA EXCISION     TONSILLECTOMY     TOTAL KNEE ARTHROPLASTY Left 09/19/2020   Procedure: TOTAL KNEE ARTHROPLASTY;  Surgeon: Menz, Michael, MD;  Location: [facility] ORS;   Service: Orthopedics;  Laterality: Left;   UMBILICAL HERNIA REPAIR     VAGINAL HYSTERECTOMY      There were no vitals filed for this visit.   Subjective Assessment - 08/20/21 1334     Subjective A little dizzy again. The prednisone and the antibiotics are gone. Ears are blocked again. Going to call her doctor to see if she needs more. Feels unsteady in her head. Going to be gone all next week and the first Monday after. Blood pressure has been high too.    Pertinent History Falls, balance. Had L TKA 09/2020. Has not been as steady as she would like since then. Also went through L knee PT at the [place] (pt place of residence). Pt fell May 01, 2021 (2 months ago). Pt was on her way into the eye center. There was an island (mini hump shape)  in the parking lot and pt walked onto the island (pt was walking up the mini hump)  and fell backwards to her L side and hit her L elbow, knee, and L head above her ear onto the pavement. Did not break anything. Had   an MRI for her head 3 weeks later which was negative. Pt currently has L masseter muscle pain since the fall (pain when opening her mouth wide. Has not had a fall in 3 years except for this one.  Does not feel as strong as she needs to be and feels it when she tries to walk any distance. Gets tired walking beyond her mailbox (60 ft away) and then her legs feel heavy. Sometimes uses a rollator. Has a long walk to get to dinner and therefore uses her car.    Patient Stated Goals Get stronger upper body and lower body.    Currently in Pain? No/denies                                        PT Education - 08/20/21 1351     Education Details ther-ex    Person(s) Educated Patient    Methods Explanation;Demonstration;Tactile cues;Verbal cues    Comprehension Returned demonstration;Verbalized understanding           Objective      No latex allergies Blood pressure is labile but pills control it.   Had  MILD procedure in low back 2 years ago, no laminectomy, no fusion.    Sit <> stand: B genu vaigus/decreased femoral control L > R     TTP L greater trochanter   Medbridge Access Code [email or web address]   Manual therapy Seated STM B cervical paraspinal and R upper trap muscles to decrease tension  Slight decrease in dizziness reported   Therapeutic exercise Blood pressure L arm sitting, mechanically taken, normal cuff: 124/71, HR 79  Seated cervical flexion 10x5 seconds for 2 sets  Decreased dizziness with gait. Improved neck comfort afterwards   Seated cervical flexion 10x2 with 5 second holds performed again  Seated manually resisted scapular retraction targeting the lower trap muscles   R 10x5 seconds for 3 sets  L 10x5 seconds for 3 sets  Seated cervical side bend stretch   R 5x10 seconds   L 5x10 seconds   Seated sternocleidomastoid stretch. Difficulty secondary to increased lower cervical pressure  Seated cervical flexion isometrics in neutral, towel roll resistance at chin 10x2 with 5 second holds   Reviewed POC: continue for another 2-3 weeks secondary to recent increase in dizziness reported. Pt verbalized understanding.        Improved exercise technique, movement at target joints, use of target muscles after mod verbal, visual, tactile cues.                  Response to treatment Pt tolerated session well without aggravatino of symotoims.      Clinical impression Decreased dizziness reported after treatment to decrease extension pressure to lower cervical spine as well as improving anterior cervical muscle activation. No dizziness reported after session. Pt will benefit from continued skilled physical therapy services to decrease pain, improve strength, balance and function.      PT Short Term Goals - 08/18/21 1336       PT SHORT TERM GOAL #1   Title Pt will be independent with her initial HEP to improve strengh, balance and decrease fall risk.    Baseline Pt  has started her HEP. (07/02/2021); no questions with her HEP (08/18/2021)    Time 3    Period Weeks    Status Achieved    Target Date 07/24/21                 PT Long Term Goals - 08/18/21 1337       PT LONG TERM GOAL #1   Title Pt will improve her DGI score by at least 3 points as a demonstration of decreased fall risk.    Baseline DGI score 19 (07/02/2021); 21 (08/18/2021)    Time 8    Period Weeks    Status Partially Met    Target Date 08/28/21      PT LONG TERM GOAL #2   Title Pt will improve bilateral hip extension and abduction strength to promote ability to negotiate obstacles with less difficulty.    Baseline Hip extension 4/5 R, 4-/5 L, Hip abduction 4/5 R, 4/5 L (07/02/2021);Hip extension 4/5 R, 4/5 L; hip abduction 4/5 R, 4/5 L (08/18/2021)    Time 8    Period Weeks    Status Partially Met    Target Date 08/28/21      PT LONG TERM GOAL #3   Title Pt will improve her FOTO score by at least 10 points as a demonstration of improved function.    Baseline Upper leg FOTO 47 (07/02/2021); 63 (08/18/2021)    Time 8    Period Weeks    Status Achieved    Target Date 08/28/21      PT LONG TERM GOAL #4   Title Pt will improve her 6 minute walk distance to 1000 ft to promote mobility at her home.    Baseline 6 minute walk distace 879 ft (07/02/2021); 1000 ft (08/18/2021)    Time 8    Period Weeks    Status Achieved    Target Date 08/28/21                   Plan - 08/20/21 1332     Clinical Impression Statement Decreased dizziness reported after treatment to decrease extension pressure to lower cervical spine as well as improving anterior cervical muscle activation. No dizziness reported after session. Pt will benefit from continued skilled physical therapy services to decrease pain, improve strength, balance and function.    Personal Factors and Comorbidities Comorbidity 3+;Age;Fitness;Past/Current Experience    Comorbidities HTN, hx of CA, COPD, depression, OA,  osteopenia, PAD    Examination-Activity Limitations Locomotion Level;Transfers;Stairs    Stability/Clinical Decision Making Stable/Uncomplicated    Rehab Potential Fair    PT Frequency 2x / week    PT Duration 8 weeks    PT Treatment/Interventions Therapeutic activities;Therapeutic exercise;Balance training;Neuromuscular re-education;Patient/family education;Manual techniques;Dry needling;Aquatic Therapy;Canalith Repostioning;Electrical Stimulation;Iontophoresis 48m/ml Dexamethasone;Gait training;Stair training;Functional mobility training;Vestibular    PT Next Visit Plan Thoracic extension, posture, trunk, glute strengthening, femoral control, balance, manual techniques, modalities PRN    Consulted and Agree with Plan of Care Patient             Patient will benefit from skilled therapeutic intervention in order to improve the following deficits and impairments:  Pain, Postural dysfunction, Improper body mechanics, Difficulty walking, Decreased strength, Decreased balance, Decreased endurance  Visit Diagnosis: History of falling  Muscle weakness (generalized)     Problem List Patient Active Problem List   Diagnosis Date Noted   S/P TKR (total knee replacement) using cement, bilateral 09/19/2020   TIA (transient ischemic attack) 01/03/2020   Migraine headache with aura 01/03/2020   Essential hypertension 01/03/2020   Depression 01/03/2020   COPD with chronic bronchitis ([place] 01/03/2020   Hypokalemia 01/03/2020   SOB (shortness of breath) 04/30/2014   Leg swelling 04/30/2014   Bronchiectasis without complication ([place] 076/72/0947  History  of DVT (deep vein thrombosis) 04/30/2014   Labile blood pressure 04/30/2014   S/P IVC filter 04/30/2014   History of right knee surgery 04/30/2014    Joneen Boers PT, DPT   08/20/2021, 2:30 PM  [facility] [address] [place], [address] Phone: [phone]    Fax:  [phone]  Name: TAELA CHARBONNEAU MRN: [id number] Date of Birth: May 12, 1936

## 2021-08-25 DIAGNOSIS — I10 Essential (primary) hypertension: Secondary | ICD-10-CM | POA: Diagnosis not present

## 2021-08-25 DIAGNOSIS — Z0000 Encounter for general adult medical examination without abnormal findings: Secondary | ICD-10-CM | POA: Diagnosis not present

## 2021-08-25 DIAGNOSIS — J449 Chronic obstructive pulmonary disease, unspecified: Secondary | ICD-10-CM | POA: Diagnosis not present

## 2021-08-25 DIAGNOSIS — M797 Fibromyalgia: Secondary | ICD-10-CM | POA: Diagnosis not present

## 2021-08-25 DIAGNOSIS — J479 Bronchiectasis, uncomplicated: Secondary | ICD-10-CM | POA: Diagnosis not present

## 2021-08-25 DIAGNOSIS — I8393 Asymptomatic varicose veins of bilateral lower extremities: Secondary | ICD-10-CM | POA: Diagnosis not present

## 2021-08-25 DIAGNOSIS — M7061 Trochanteric bursitis, right hip: Secondary | ICD-10-CM | POA: Diagnosis not present

## 2021-08-25 DIAGNOSIS — Z8616 Personal history of COVID-19: Secondary | ICD-10-CM | POA: Diagnosis not present

## 2021-08-25 DIAGNOSIS — M1990 Unspecified osteoarthritis, unspecified site: Secondary | ICD-10-CM | POA: Diagnosis not present

## 2021-08-25 DIAGNOSIS — Z8669 Personal history of other diseases of the nervous system and sense organs: Secondary | ICD-10-CM | POA: Diagnosis not present

## 2021-08-25 DIAGNOSIS — M7062 Trochanteric bursitis, left hip: Secondary | ICD-10-CM | POA: Diagnosis not present

## 2021-08-25 DIAGNOSIS — J309 Allergic rhinitis, unspecified: Secondary | ICD-10-CM | POA: Diagnosis not present

## 2021-08-26 ENCOUNTER — Ambulatory Visit: Payer: PPO | Admitting: Cardiovascular Disease

## 2021-09-04 ENCOUNTER — Ambulatory Visit: Payer: PPO | Attending: Neurology

## 2021-09-04 DIAGNOSIS — Z9181 History of falling: Secondary | ICD-10-CM | POA: Insufficient documentation

## 2021-09-04 DIAGNOSIS — M6281 Muscle weakness (generalized): Secondary | ICD-10-CM | POA: Insufficient documentation

## 2021-09-10 ENCOUNTER — Ambulatory Visit: Payer: PPO

## 2021-09-11 ENCOUNTER — Ambulatory Visit: Payer: PPO

## 2021-09-11 DIAGNOSIS — M6281 Muscle weakness (generalized): Secondary | ICD-10-CM | POA: Diagnosis not present

## 2021-09-11 DIAGNOSIS — Z9181 History of falling: Secondary | ICD-10-CM | POA: Diagnosis not present

## 2021-09-11 NOTE — Therapy (Signed)
[facility] [address] [place], [place], [address] Phone: [phone]   Fax:  [phone]  Physical Therapy Treatment And Discharge Summary  Patient Details  Name: Lindsey [name] MRN: [id number] Date of Birth: April 26, 1936 Referring Provider (PT): Vladimir Crofts, MD   Encounter Date: 09/11/2021   PT End of Session - 09/11/21 1506     Visit Number 12    Number of Visits 17    Date for PT Re-Evaluation 08/28/21    Authorization Type 2    Authorization Time Period 10    PT Start Time 1506    PT Stop Time 1549    PT Time Calculation (min) 43 min    Activity Tolerance Patient tolerated treatment well    Behavior During Therapy [facility] for tasks assessed/performed             Past Medical History:  Diagnosis Date   Anemia    Breast cancer ([place])    Bronchiectasis ([place]) 06/2007   Cancer ([place])    Colitis    COPD (chronic obstructive pulmonary disease) ([place])    Depression    DVT (deep venous thrombosis) ([place])    after her right knee surgery.    Fibromyalgia    Fibromyalgia    Hypertension    Migraine headache with aura    Osteoarthritis    Osteoporosis    PAD (peripheral artery disease) ([place])    Pneumonia    Polio 1952   Stenosis of carotid artery     Past Surgical History:  Procedure Laterality Date   ABDOMINAL HYSTERECTOMY     BACK SURGERY     BILATERAL TOTAL MASTECTOMY WITH AXILLARY LYMPH NODE DISSECTION     BREAST SURGERY     CARPAL TUNNEL RELEASE     bilateral    COLONOSCOPY WITH PROPOFOL N/A 08/02/2015   Procedure: COLONOSCOPY WITH PROPOFOL;  Surgeon: Manya Silvas, MD;  Location: [facility] ENDOSCOPY;  Service: Endoscopy;  Laterality: N/A;   FINGER TENDON REPAIR     HERNIA REPAIR     REPLACEMENT TOTAL KNEE     right knee    SQUAMOUS CELL CARCINOMA EXCISION     TONSILLECTOMY     TOTAL KNEE ARTHROPLASTY Left 09/19/2020   Procedure: TOTAL KNEE ARTHROPLASTY;  Surgeon: Hessie Knows, MD;   Location: [facility] ORS;  Service: Orthopedics;  Laterality: Left;   UMBILICAL HERNIA REPAIR     VAGINAL HYSTERECTOMY      There were no vitals filed for this visit.   Subjective Assessment - 09/11/21 1508     Subjective Dizziness is much better. Got corisone which helped. No dizziness. No falls recently. No pain currently. Ready to graduate from PT.    Pertinent History Falls, balance. Had L TKA 09/2020. Has not been as steady as she would like since then. Also went through L knee PT at the [place] (pt place of residence). Pt fell May 01, 2021 (2 months ago). Pt was on her way into the eye center. There was an island (mini hump shape)  in the parking lot and pt walked onto [employer] (pt was walking up the mini hump)  and fell backwards to her L side and hit her L elbow, knee, and L head above her ear onto the pavement. Did not break anything. Had an MRI for her head 3 weeks later which was negative. Pt currently has L masseter muscle pain since the fall (pain when opening her  mouth wide. Has not had a fall in 3 years except for this one.  Does not feel as strong as she needs to be and feels it when she tries to walk any distance. Gets tired walking beyond her mailbox (60 ft away) and then her legs feel heavy. Sometimes uses a rollator. Has a long walk to get to dinner and therefore uses her car.    Patient Stated Goals Get stronger upper body and lower body.    Currently in Pain? No/denies                [facility] PT Assessment - 09/11/21 1511       Dynamic Gait Index   Level Surface Normal    Change in Gait Speed Normal    Gait with Horizontal Head Turns Mild Impairment    Gait with Vertical Head Turns Mild Impairment    Gait and Pivot Turn Normal    Step Over Obstacle Mild Impairment    Step Around Obstacles Normal    Steps Mild Impairment    Total Score 20    DGI comment: < 19 suggests increased risk for falls.                                     PT Education - 09/11/21 1556     Education Details ther-ex    Person(s) Educated Patient    Methods Explanation;Demonstration;Tactile cues;Verbal cues    Comprehension Returned demonstration;Verbalized understanding           Objective      No latex allergies Blood pressure is labile but pills control it.   Had MILD procedure in low back 2 years ago, no laminectomy, no fusion.    Sit <> stand: B genu vaigus/decreased femoral control L > R     TTP L greater trochanter   Medbridge Access Code [email or web address]   Manual therapy  Seated STM B cervical paraspinal and B upper trap muscles to decrease tension And possible cervicogenic source of unsteadiness with gait and head movements.     Therapeutic exercise Directed patient with gait with normal gait speed, with changes in speed, 180 degree pivot turn, with R and L cervical rotation position, with cervical flexion and extension position, stepping around obstacles, stepping over an obstacle, ascending and descending 4 regular steps with UE assist   Manually resisted prone hip extension, S/L hip abduction 1x each way for each LE  Reviewed progress with PT towards goals   Gait 32 ft with R and L head turns 2x.    Decreased unsteadiness after manual therapy to decrease neck tension.    Gait 32 ft with cervical extension and flexion    Decreased unsteadiness after manual therapy to decrease neck tension.    Improved exercise technique, movement at target joints, use of target muscles after min to mod verbal, visual, tactile cues.                      Response to treatment Pt tolerated session well without aggravatino of symotoims.      Clinical impression  Similar hip strength as initial evaluation secondary to main emphasis during treatments was to help decrease dizziness. Slight decrease in DGI score by 1 point to  20 compared to previous measurement in which slight  unsteadiness during gait with head movement played a factor. Improved steadiness with gait with head turns after  treatment to decrease muscle tension to posterior neck. Pt also demonstrates improved function, ability to ambulate longer distances as well as no report of falls since PT. Skilled physical therapy services discharged after re-certification for today's visit with pt continuing with her home exercises.          PT Short Term Goals - 08/18/21 1336       PT SHORT TERM GOAL #1   Title Pt will be independent with her initial HEP to improve strengh, balance and decrease fall risk.    Baseline Pt has started her HEP. (07/02/2021); no questions with her HEP (08/18/2021)    Time 3    Period Weeks    Status Achieved    Target Date 07/24/21               PT Long Term Goals - 09/11/21 1524       PT LONG TERM GOAL #1   Title Pt will improve her DGI score by at least 3 points as a demonstration of decreased fall risk.    Baseline DGI score 19 (07/02/2021); 21 (08/18/2021); 20 (09/11/2021)    Time 1    Period Weeks    Status Partially Met    Target Date 09/11/21      PT LONG TERM GOAL #2   Title Pt will improve bilateral hip extension and abduction strength to promote ability to negotiate obstacles with less difficulty.    Baseline Hip extension 4/5 R, 4-/5 L, Hip abduction 4/5 R, 4/5 L (07/02/2021);Hip extension 4/5 R, 4/5 L; hip abduction 4/5 R, 4/5 L (08/18/2021); Hip extension 4/5 R, 4/5 L, hip abduction 4/5 L, 4/5 R.  (09/11/2021)    Time 1    Period Weeks    Status Partially Met    Target Date 09/11/21      PT LONG TERM GOAL #3   Title Pt will improve her FOTO score by at least 10 points as a demonstration of improved function.    Baseline Upper leg FOTO 47 (07/02/2021); 63 (08/18/2021); 62 (09/11/2021)    Time 8    Period Weeks    Status Achieved    Target Date 08/28/21      PT LONG TERM GOAL #4   Title Pt will improve her 6 minute walk distance to 1000 ft to promote  mobility at her home.    Baseline 6 minute walk distace 879 ft (07/02/2021); 1000 ft (08/18/2021)    Time 8    Period Weeks    Status Achieved    Target Date 08/28/21                   Plan - 09/11/21 1505     Clinical Impression Statement Similar hip strength as initial evaluation secondary to main emphasis during treatments was to help decrease dizziness. Slight decrease in DGI score by 1 point to  20 compared to previous measurement in which slight unsteadiness during gait with head movement played a factor. Improved steadiness with gait with head turns after treatment to decrease muscle tension to posterior neck. Pt also demonstrates improved function, ability to ambulate longer distances as well as no report of falls since PT. Skilled physical therapy services discharged after re-certification for today's visit with pt continuing with her home exercises.    Personal Factors and Comorbidities Comorbidity 3+;Age;Fitness;Past/Current Experience    Comorbidities HTN, hx of CA, COPD, depression, OA, osteopenia, PAD    Examination-Activity Limitations Locomotion Level;Transfers;Stairs    Stability/Clinical Decision  Making Stable/Uncomplicated    Rehab Potential Fair    PT Frequency One time visit    PT Duration --    PT Treatment/Interventions Therapeutic activities;Therapeutic exercise;Balance training;Neuromuscular re-education;Patient/family education;Manual techniques;Dry needling;Aquatic Therapy;Canalith Repostioning;Electrical Stimulation;Iontophoresis 59m/ml Dexamethasone;Gait training;Stair training;Functional mobility training;Vestibular    PT Next Visit Plan Continue with H[place] ViewAccess Code [email or web address]   Consulted and Agree with Plan of Care Patient             Patient will benefit from skilled therapeutic intervention in order to improve the following deficits and impairments:  Pain, Postural dysfunction, Improper body mechanics,  Difficulty walking, Decreased strength, Decreased balance, Decreased endurance  Visit Diagnosis: History of falling  Muscle weakness (generalized)     Problem List Patient Active Problem List   Diagnosis Date Noted   S/P TKR (total knee replacement) using cement, bilateral 09/19/2020   TIA (transient ischemic attack) 01/03/2020   Migraine headache with aura 01/03/2020   Essential hypertension 01/03/2020   Depression 01/03/2020   COPD with chronic bronchitis ([facility] 01/03/2020   Hypokalemia 01/03/2020   SOB (shortness of breath) 04/30/2014   Leg swelling 04/30/2014   Bronchiectasis without complication ([facility] 069/50/7225  History of DVT (deep vein thrombosis) 04/30/2014   Labile blood pressure 04/30/2014   S/P IVC filter 04/30/2014   History of right knee surgery 04/30/2014   Thank you for your referral.  MJoneen BoersPT, DPT   09/11/2021, 4:06 PM  [facility] [address] [place] [id number]: [phone]  Fax:  [phone] Name: Lindsey GROUTMRN: [id number] of Birth: 101/25/1937

## 2021-09-15 DIAGNOSIS — H353211 Exudative age-related macular degeneration, right eye, with active choroidal neovascularization: Secondary | ICD-10-CM | POA: Diagnosis not present

## 2021-09-16 ENCOUNTER — Other Ambulatory Visit: Payer: Self-pay

## 2021-09-16 ENCOUNTER — Encounter: Payer: Self-pay | Admitting: Cardiovascular Disease

## 2021-09-16 ENCOUNTER — Ambulatory Visit: Payer: PPO | Admitting: Cardiovascular Disease

## 2021-09-16 VITALS — BP 120/84 | HR 73 | Ht 62.0 in | Wt 129.1 lb

## 2021-09-16 DIAGNOSIS — I739 Peripheral vascular disease, unspecified: Secondary | ICD-10-CM

## 2021-09-16 DIAGNOSIS — M7989 Other specified soft tissue disorders: Secondary | ICD-10-CM

## 2021-09-16 DIAGNOSIS — I10 Essential (primary) hypertension: Secondary | ICD-10-CM | POA: Diagnosis not present

## 2021-09-16 DIAGNOSIS — I6529 Occlusion and stenosis of unspecified carotid artery: Secondary | ICD-10-CM | POA: Diagnosis not present

## 2021-09-16 DIAGNOSIS — E782 Mixed hyperlipidemia: Secondary | ICD-10-CM

## 2021-09-16 MED ORDER — TELMISARTAN 40 MG PO TABS: ORAL_TABLET | ORAL | 3 refills | Status: DC

## 2021-09-16 MED ORDER — FUROSEMIDE 20 MG PO TABS: ORAL_TABLET | ORAL | 3 refills | Status: DC

## 2021-09-16 MED ORDER — BISOPROLOL FUMARATE 5 MG PO TABS: 5.0000 mg | ORAL_TABLET | Freq: Every day | ORAL | 3 refills | Status: DC

## 2021-09-16 NOTE — Patient Instructions (Addendum)
Look for  Claudette Stapler, MD Address: [place], [place], [place] [address] Phone: [phone]   Medication Instructions:  Please START Lasix 20 mg daily three times a week Mon, Wed, & Fri bisoprolol 5 mg daily Telmisartan 40 mg in the evening Only take the telmisartan in the PM if pressure high >140 If you need a refill on your cardiac medications before your next appointment, please call your pharmacy.   Lab work: No new labs needed  Testing/Procedures: No new testing needed  Follow-Up: At [facility], you and your health needs are our priority.  As part of our continuing mission to provide you with exceptional heart care, we have created designated Provider Care Teams.  These Care Teams include your primary Cardiologist (physician) and Advanced Practice Providers (APPs -  Physician Assistants and Nurse Practitioners) who all work together to provide you with the care you need, when you need it.  You will need a follow up appointment in 6 months  Providers on your designated Care Team:   Murray Hodgkins, NP Christell Faith, PA-C Cadence Kathlen Mody, [place]  COVID-19 Vaccine Information can be found at: [email or web address] For questions related to vaccine distribution or appointments, please email [email or web address] or call [phone].

## 2021-09-16 NOTE — Progress Notes (Signed)
Cardiology Office Note  Date:  09/16/2021   ID:  Baker, Kogler 1936/06/01, MRN [id number]  PCP:  Tracie Harrier, MD   Chief Complaint  Patient presents with   12 month follow up     Patient c/o shortness of breath with walking. Medications reviewed by the patient verbally.     HPI:  Lindsey [name] is a pleasant 85 year old woman with history of  lower extremity swelling,Secondary to venous insufficiency  prior right knee surgery,  right DVT, status post IVC filter,  bronchiectasis on inhalers and nebulizers at home  Back surgery who presents for follow up of her labile blood pressure and her leg swelling.  "I have bronchiestasis", followed by primary care Does nebs, inhalers, Increased SOB, wonders if there is more to be done Has not seen pulmonary  Chronic knee pain May have some leg swelling, mild  Echo 12/2019 EF 60%  Prior history hypertension Blood pressure well controlled on today's visit  EKG personally reviewed by myself on todays visit Shows normal sinus rhythm rate 73 bpm, frequent PACs, frequent short runs of atrial tachycardia on rhythm strip  Other past medical history reviewed has carotid disease done through [facility], was told she had moderate disease  Echocardiogram reviewed from [facility] clinic Mild to moderate MR with normal LV function  Total chol 205, LDL 81 Does not want a statin  visit to the emergency room ER 6/28,2018 Reported having dizziness and shortness of breath. Presented relatively acutely  Was drinking extra wine ,  stood up she felt dizzy/lightheaded and "breathing heavily".  Symptoms lasted approximately 2 hours  resolved.  Workup in the emergency room was benign Lab work as detailed below discussed with her Sodium 134, potassium 3.2, BUN 21 She does not take potassium supplement on a daily basis  Leg swelling History dates back many years but started with right knee surgery after an accident, fracture. She suffered a DVT,  had IVC filter placed. Swelling worse at the end of the day, better in the morning. She does have significant varicosities.   bronchiectasis diagnosed at outside facility.  stress test may 2015 which was reportedly normal  PMH:   has a past medical history of Anemia, Breast cancer ([place]), Bronchiectasis ([place]) (06/2007), Cancer ([place]), Colitis, COPD (chronic obstructive pulmonary disease) ([place]), Depression, DVT (deep venous thrombosis) ([place]), Fibromyalgia, Fibromyalgia, Hypertension, Migraine headache with aura, Osteoarthritis, Osteoporosis, PAD (peripheral artery disease) ([place]), Pneumonia, Polio (1952), and Stenosis of carotid artery.  PSH:    Past Surgical History:  Procedure Laterality Date   ABDOMINAL HYSTERECTOMY     BACK SURGERY     BILATERAL TOTAL MASTECTOMY WITH AXILLARY LYMPH NODE DISSECTION     BREAST SURGERY     CARPAL TUNNEL RELEASE     bilateral    COLONOSCOPY WITH PROPOFOL N/A 08/02/2015   Procedure: COLONOSCOPY WITH PROPOFOL;  Surgeon: Manya Silvas, MD;  Location: [facility] ENDOSCOPY;  Service: Endoscopy;  Laterality: N/A;   FINGER TENDON REPAIR     HERNIA REPAIR     REPLACEMENT TOTAL KNEE     right knee    SQUAMOUS CELL CARCINOMA EXCISION     TONSILLECTOMY     TOTAL KNEE ARTHROPLASTY Left 09/19/2020   Procedure: TOTAL KNEE ARTHROPLASTY;  Surgeon: Hessie Knows, MD;  Location: [facility] ORS;  Service: Orthopedics;  Laterality: Left;   UMBILICAL HERNIA REPAIR     VAGINAL HYSTERECTOMY      Current Outpatient Medications on File Prior to Visit  Medication Sig Dispense Refill  albuterol (PROVENTIL HFA;VENTOLIN HFA) 108 (90 Base) MCG/ACT inhaler Inhale 2 puffs into the lungs every 6 (six) hours as needed for wheezing or shortness of breath.      ARIPiprazole (ABILIFY) 2 MG tablet Take 2 mg by mouth daily.      cyclobenzaprine (FLEXERIL) 5 MG tablet Take 5 mg by mouth at bedtime as needed.     doxazosin (CARDURA) 2 MG tablet Take 1 tablet (2 mg total) by mouth 2 (two) times  daily. 180 tablet 3   Galcanezumab-gnlm (EMGALITY) 120 MG/ML SOAJ Inject 120 mg into the skin every 30 (thirty) days.     hydrALAZINE (APRESOLINE) 25 MG tablet Take 1 tablet (25 mg total) by mouth as needed (for BP >170). 90 tablet 3   hydrochlorothiazide (HYDRODIURIL) 25 MG tablet Take 1 tablet (25 mg total) by mouth daily. 90 tablet 3   hydrOXYzine (ATARAX/VISTARIL) 25 MG tablet Take 25 mg by mouth as needed for anxiety.     meclizine (ANTIVERT) 25 MG tablet SMARTSIG:1 Tablet(s) By Mouth Every 12 Hours PRN     NON FORMULARY Take 2 capsules by mouth in the morning and at bedtime. Algaecal     NON FORMULARY Take 2 capsules by mouth daily. Strontium Boost     polyethylene glycol (MIRALAX / GLYCOLAX) packet Take 17 g by mouth daily as needed for mild constipation or moderate constipation.      potassium chloride (KLOR-CON M10) 10 MEQ tablet Take 1 tablet (10 mEq total) by mouth daily. 90 tablet 3   rizatriptan (MAXALT) 5 MG tablet Take 5 mg by mouth as needed for migraine.      SYMBICORT 160-4.5 MCG/ACT inhaler SMARTSIG:2 Inhalation Via Inhaler Twice Daily     telmisartan (MICARDIS) 80 MG tablet TAKE 1 TABLET BY MOUTH DAILY 90 tablet 1   venlafaxine XR (EFFEXOR-XR) 75 MG 24 hr capsule Take 75 mg by mouth daily with breakfast.     enoxaparin (LOVENOX) 40 MG/0.4ML injection Inject 0.4 mLs (40 mg total) into the skin daily for 14 days. 5.6 mL 0   ezetimibe (ZETIA) 10 MG tablet Take 1 tablet (10 mg total) by mouth daily. 90 tablet 3   HYDROcodone-acetaminophen (NORCO/VICODIN) 5-325 MG tablet Take 1-2 tablets by mouth every 4 (four) hours as needed for moderate pain (pain score 4-6). 30 tablet 0   methocarbamol (ROBAXIN) 500 MG tablet Take 1 tablet (500 mg total) by mouth every 6 (six) hours as needed for muscle spasms. 30 tablet 0   Omega-3 Fatty Acids (FISH OIL) 1000 MG CAPS Take 2,000 mg by mouth daily.      OVER THE COUNTER MEDICATION Take 1 tablet by mouth daily. Adren-ALL (Patient not taking:  Reported on 09/13/2020)     traMADol (ULTRAM) 50 MG tablet Take 1 tablet (50 mg total) by mouth every 6 (six) hours as needed. 30 tablet 0   No current facility-administered medications on file prior to visit.    Allergies:   Ivp dye [iodinated diagnostic agents], Codeine, Demeclocycline, Sulfa antibiotics, Tegaderm ag mesh [silver], and Tetracyclines & related   Social History:  The patient  reports that she has never smoked. She has never used smokeless tobacco. She reports current alcohol use of about 1.0 standard drink per week. She reports that she does not use drugs.   Family History:   family history includes Atrial fibrillation in her sister.    Review of Systems: Review of Systems  Constitutional: Negative.   HENT: Negative.    Respiratory:  Positive for cough and shortness of breath.   Cardiovascular: Negative.   Gastrointestinal: Negative.   Neurological: Negative.   Psychiatric/Behavioral: Negative.    All other systems reviewed and are negative.  PHYSICAL EXAM: VS:  BP 120/84 (BP Location: Left Arm, Patient Position: Sitting, Cuff Size: Normal)    Pulse 73    Ht 5\' 2"  (1.575 m)    Wt 129 lb 2 oz (58.6 kg)    SpO2 98%    BMI 23.62 kg/m  , BMI Body mass index is 23.62 kg/m.  Constitutional:  oriented to person, place, and time. No distress.  HENT:  Head: Grossly normal Eyes:  no discharge. No scleral icterus.  Neck: No JVD, no carotid bruits  Cardiovascular: Regular rate and rhythm, ectopy, no murmurs appreciated Pulmonary/Chest: Clear to auscultation bilaterally, no wheezes.  Scattered Rales at the bases Abdominal: Soft.  no distension.  no tenderness.  Musculoskeletal: Normal range of motion Neurological:  normal muscle tone. Coordination normal. No atrophy Skin: Skin warm and dry Psychiatric: normal affect, pleasant   Recent Labs: 10/21/2020: BUN 19; Creatinine, Ser 0.79; Hemoglobin 12.0; Platelets 195; Potassium 3.8; Sodium 133    Lipid Panel Lab Results   Component Value Date   CHOL 204 (H) 01/04/2020   HDL 113 01/04/2020   LDLCALC 84 01/04/2020   TRIG 37 01/04/2020    Wt Readings from Last 3 Encounters:  09/16/21 129 lb 2 oz (58.6 kg)  10/21/20 125 lb (56.7 kg)  09/19/20 124 lb (56.2 kg)     ASSESSMENT AND PLAN:  Labile blood pressure Well-controlled on today's visit Medication changes made given tachypalpitations, will add bisoprolol 5 mg daily, Lasix 3 times a week for shortness of breath and trace ankle swelling concern for elevated right heart pressures in the setting of bronchiectasis/lung disease, Decrease telmisartan down to 40 in the evening  History of DVT (deep vein thrombosis) No recurrent episodes, stable  Bronchiectasis without complication (HCC) Reports worsening shortness of breath, cough Using inhalers, nebs, recommend she discuss with pulmonary  Leg swelling Recommended ankle high socks Will start Lasix 20 mg 3 days a week  PAD/carotid disease Does not want a statin,  Continue Zetia  Atrial tachycardia Short runs noted on EKG, will add bisoprolol 5 mg daily Unclear if tachypalpitations contributing to shortness of breath   Total encounter time more than 25 minutes  Greater than 50% was spent in counseling and coordination of care with the patient   No orders of the defined types were placed in this encounter.    Signed, Esmond Plants, M.D., Ph.D. 09/16/2021  [place], [employer]

## 2021-09-18 ENCOUNTER — Telehealth: Payer: Self-pay | Admitting: Cardiovascular Disease

## 2021-09-18 NOTE — Telephone Encounter (Signed)
Patient returning call.

## 2021-09-18 NOTE — Telephone Encounter (Signed)
Left voicemail message to call back for review of her concerns.

## 2021-09-18 NOTE — Telephone Encounter (Signed)
Called patient back and left a VM requesting a call back. 

## 2021-09-18 NOTE — Telephone Encounter (Signed)
Patient has questions regarding Bisoprolol. She asks if it causes dizziness. Please call to discuss. Patient states she initially was seen for dizziness.

## 2021-09-19 NOTE — Telephone Encounter (Signed)
Was able to return call to Lindsey [name], reports the side affect to bisoprolol is "dizziness" which has has been experiencing for a while, even before starting this medication.  She reports her dizziness is not worse since starting medication and has not changed. Asked if she has been taking her meclizine for her dizziness, pt reports no, she forgot she had medication and what it was used for. Recommend she try meclizine 25 mg as this is an PRN medication for dizziness her PCP prescribe, to see if that makes any difference with her dizziness, also to monitor BP/HR with her dizzy spells.   [name] verbalized understanding, she will try the meclizine to see if that is any better, and monitor her BP/HR. She will call back if no improvement or if dizziness gets worse

## 2021-09-19 NOTE — Telephone Encounter (Signed)
Patient returning call.

## 2021-09-22 DIAGNOSIS — K52832 Lymphocytic colitis: Secondary | ICD-10-CM | POA: Diagnosis not present

## 2021-09-22 DIAGNOSIS — M8580 Other specified disorders of bone density and structure, unspecified site: Secondary | ICD-10-CM | POA: Diagnosis not present

## 2021-09-22 DIAGNOSIS — F3341 Major depressive disorder, recurrent, in partial remission: Secondary | ICD-10-CM | POA: Diagnosis not present

## 2021-09-22 DIAGNOSIS — R5383 Other fatigue: Secondary | ICD-10-CM | POA: Diagnosis not present

## 2021-09-22 DIAGNOSIS — J449 Chronic obstructive pulmonary disease, unspecified: Secondary | ICD-10-CM | POA: Diagnosis not present

## 2021-09-22 DIAGNOSIS — M1990 Unspecified osteoarthritis, unspecified site: Secondary | ICD-10-CM | POA: Diagnosis not present

## 2021-09-22 DIAGNOSIS — Z96652 Presence of left artificial knee joint: Secondary | ICD-10-CM | POA: Diagnosis not present

## 2021-09-22 DIAGNOSIS — Z8669 Personal history of other diseases of the nervous system and sense organs: Secondary | ICD-10-CM | POA: Diagnosis not present

## 2021-09-22 DIAGNOSIS — J479 Bronchiectasis, uncomplicated: Secondary | ICD-10-CM | POA: Diagnosis not present

## 2021-09-22 DIAGNOSIS — Z8616 Personal history of COVID-19: Secondary | ICD-10-CM | POA: Diagnosis not present

## 2021-09-22 DIAGNOSIS — M797 Fibromyalgia: Secondary | ICD-10-CM | POA: Diagnosis not present

## 2021-09-22 DIAGNOSIS — R001 Bradycardia, unspecified: Secondary | ICD-10-CM | POA: Diagnosis not present

## 2021-09-22 DIAGNOSIS — I8393 Asymptomatic varicose veins of bilateral lower extremities: Secondary | ICD-10-CM | POA: Diagnosis not present

## 2021-09-22 DIAGNOSIS — M7062 Trochanteric bursitis, left hip: Secondary | ICD-10-CM | POA: Diagnosis not present

## 2021-09-22 DIAGNOSIS — M7061 Trochanteric bursitis, right hip: Secondary | ICD-10-CM | POA: Diagnosis not present

## 2021-09-22 DIAGNOSIS — J309 Allergic rhinitis, unspecified: Secondary | ICD-10-CM | POA: Diagnosis not present

## 2021-09-22 DIAGNOSIS — R5381 Other malaise: Secondary | ICD-10-CM | POA: Diagnosis not present

## 2021-09-22 DIAGNOSIS — R7989 Other specified abnormal findings of blood chemistry: Secondary | ICD-10-CM | POA: Diagnosis not present

## 2021-09-22 DIAGNOSIS — I10 Essential (primary) hypertension: Secondary | ICD-10-CM | POA: Diagnosis not present

## 2021-09-22 DIAGNOSIS — Z8249 Family history of ischemic heart disease and other diseases of the circulatory system: Secondary | ICD-10-CM | POA: Diagnosis not present

## 2021-09-23 NOTE — Telephone Encounter (Signed)
Already seen unable to close encounter

## 2021-10-01 ENCOUNTER — Other Ambulatory Visit: Payer: Self-pay | Admitting: Cardiovascular Disease

## 2021-10-13 ENCOUNTER — Telehealth: Payer: Self-pay | Admitting: Cardiovascular Disease

## 2021-10-13 NOTE — Telephone Encounter (Signed)
Was able to reach back out to Mrs. Purdom to f/u on her on-going dizziness. Pt has hx of dizziness has meclizine PRN. Pt reports has not used her meclizine as much, but noticed her dizziness is happening more with activities and more so "when I am not at home, I am out an about".   Advised pt when the dizziness occurs to sit and rest, increase hydration and when possible try to record BP and HR from sitting to standing to see if there is a difference (drop) in BP. Would be best to record a log for review.  Also, reminded pt of her meclizine, can take 25 mg Q 12 hrs PRN. Mrs. Fallen verbalized understanding. Would like an appt to discuss her medications as well to make sure they are not causing the increase dizziness.   Appt made 1/27 at 11:20 with Ignacia Bayley, NP. In the meantime pt will increase hydration during dizzy spells, move slow, monitor BP/HR and take her meclizine. Otherwise all questions were address and no additional concerns at this time. [name] thankful for the return call and advice. Agreeable to plan, will call back for anything further.

## 2021-10-13 NOTE — Telephone Encounter (Signed)
STAT if patient feels like he/she is going to faint   Are you dizzy now? No   Do you feel faint or have you passed out? Intermittent feeling lightheaded after ambulating denies any LOC    Do you have any other symptoms? Sob and once cp   Have you checked your HR and BP (record if available)? Patient reports home vs normal but brady at pcp office during an episode   Patient is not sure if this is a med issue causing

## 2021-10-15 ENCOUNTER — Telehealth: Payer: Self-pay

## 2021-10-15 ENCOUNTER — Emergency Department
Admission: EM | Admit: 2021-10-15 | Discharge: 2021-10-15 | Disposition: A | Discharge status: Home / Self Care | Payer: Medicare Other | Attending: Emergency Medicine | Admitting: Emergency Medicine

## 2021-10-15 ENCOUNTER — Other Ambulatory Visit: Payer: Self-pay

## 2021-10-15 ENCOUNTER — Emergency Department: Payer: Medicare Other

## 2021-10-15 ENCOUNTER — Encounter: Payer: Self-pay | Admitting: Emergency Medicine

## 2021-10-15 DIAGNOSIS — I10 Essential (primary) hypertension: Secondary | ICD-10-CM

## 2021-10-15 DIAGNOSIS — R0602 Shortness of breath: Secondary | ICD-10-CM | POA: Insufficient documentation

## 2021-10-15 DIAGNOSIS — J449 Chronic obstructive pulmonary disease, unspecified: Secondary | ICD-10-CM | POA: Insufficient documentation

## 2021-10-15 DIAGNOSIS — R42 Dizziness and giddiness: Secondary | ICD-10-CM | POA: Diagnosis present

## 2021-10-15 LAB — TROPONIN I (HIGH SENSITIVITY): Troponin I (High Sensitivity): 10 ng/L (ref <18)

## 2021-10-15 LAB — URINALYSIS, ROUTINE W REFLEX MICROSCOPIC
Bacteria, UA: NONE SEEN
Bilirubin Urine: NEGATIVE
Glucose, UA: NEGATIVE mg/dL
Hgb urine dipstick: NEGATIVE
Ketones, ur: NEGATIVE mg/dL
Nitrite: NEGATIVE
Protein, ur: NEGATIVE mg/dL
Specific Gravity, Urine: 1.01 (ref 1.005–1.030)
pH: 7 (ref 5.0–8.0)

## 2021-10-15 LAB — BASIC METABOLIC PANEL
Anion gap: 8 (ref 5–15)
BUN: 24 mg/dL — ABNORMAL HIGH (ref 8–23)
CO2: 27 mmol/L (ref 22–32)
Calcium: 8.7 mg/dL — ABNORMAL LOW (ref 8.9–10.3)
Chloride: 99 mmol/L (ref 98–111)
Creatinine, Ser: 0.73 mg/dL (ref 0.44–1.00)
GFR, Estimated: >60 mL/min (ref >60)
Glucose, Bld: 101 mg/dL — ABNORMAL HIGH (ref 70–99)
Potassium: 4.3 mmol/L (ref 3.5–5.1)
Sodium: 134 mmol/L — ABNORMAL LOW (ref 135–145)

## 2021-10-15 LAB — CBC
HCT: 41.8 % (ref 36.0–46.0)
Hemoglobin: 13.8 g/dL (ref 12.0–15.0)
MCH: 29.6 pg (ref 26.0–34.0)
MCHC: 33 g/dL (ref 30.0–36.0)
MCV: 89.5 fL (ref 80.0–100.0)
Platelets: 211 10*3/uL (ref 150–400)
RBC: 4.67 MIL/uL (ref 3.87–5.11)
RDW: 14.2 % (ref 11.5–15.5)
WBC: 5.2 10*3/uL (ref 4.0–10.5)
nRBC: 0 % (ref 0.0–0.2)

## 2021-10-15 MED ORDER — DOXAZOSIN MESYLATE 2 MG PO TABS
2.0000 mg | ORAL_TABLET | Freq: Once | ORAL | Status: DC
Start: 2021-10-15 — End: 2021-10-15
  Filled 2021-10-15: qty 1

## 2021-10-15 MED ORDER — IRBESARTAN 150 MG PO TABS
150.0000 mg | ORAL_TABLET | Freq: Once | ORAL | Status: DC
  Filled 2021-10-15: qty 1

## 2021-10-15 NOTE — Telephone Encounter (Signed)
Was able to return call to Lindsey [name] in regards to her chronic/on-going dizziness with weakness. Spoke to pt regarding same earlier this week, has also seen Dr. Rockey Situ last month and discuss her dizziness.   Now pt concern as she is experiencing dizziness after eating at times and more weaker then usual. Antivert is not helping her dizziness and her BP stable per pt (could not provide numbers).  Pt has an appt later this month with Lindsey Bayley, NP, for now advised to seek ER for lab work and possible CT head, pt last CT head was 05/2021 for Left blunt trauma post fall, headache, dizziness.   Lindsey [name] verbalized understanding, will not drive self there d/t dizziness, advised at her upcoming appt will be able to review her labs from ER and any additional testing. Otherwise all questions were address and no additional concerns at this time. Lindsey [name] thankful for the return call and advice. Agreeable to plan, will call back for anything further.

## 2021-10-15 NOTE — ED Provider Notes (Signed)
[facility] Provider Note    Event Date/Time   First MD Initiated Contact with Patient 10/15/21 1842     (approximate)   History   Chief Complaint Dizziness   HPI  Lindsey [name] is a 86 y.o. female with past medical history of hypertension, COPD, migraines, bronchiectasis, and DVT status post IVC filter who presents to the ED complaining of dizziness.  Patient reports that she has been having intermittent episodes of dizziness and lightheadedness for about the past 2 years.  They have been occurring with increasing frequency over about the past week, now occurring on a daily basis.  She describes a feeling of lightheadedness that is worse when she is exerting herself, like when she goes to walk up a hill.  She states she will occasionally feel short of breath, but she denies any pain in her chest.  She has not had any fevers or cough, reports mild swelling in her legs but no pain in her legs.  She spoke with her cardiologist office earlier today, who recommended she come to the ED for further evaluation.     Physical Exam   Triage Vital Signs: ED Triage Vitals  Enc Vitals Group     BP 10/15/21 1522 (!) 161/90     Pulse Rate 10/15/21 1522 87     Resp 10/15/21 1522 20     Temp 10/15/21 1522 98.6 F (37 C)     Temp Source 10/15/21 1522 Oral     SpO2 10/15/21 1522 96 %     Weight 10/15/21 1523 125 lb (56.7 kg)     Height 10/15/21 1523 5\' 2"  (1.575 m)     Head Circumference --      Peak Flow --      Pain Score 10/15/21 1523 0     Pain Loc --      Pain Edu? --      Excl. in [place]? --     Most recent vital signs: Vitals:   10/15/21 2030 10/15/21 2100  BP: (!) 191/105 (!) 194/114  Pulse: 74 70  Resp: 19 16  Temp:    SpO2: 96% 96%    Constitutional: Alert and oriented. Eyes: Conjunctivae are normal. Head: Atraumatic. Nose: No congestion/rhinnorhea. Mouth/Throat: Mucous membranes are moist.  Cardiovascular: Normal rate, regular rhythm. Grossly  normal heart sounds.  2+ radial pulses bilaterally. Respiratory: Normal respiratory effort.  No retractions. Lungs CTAB. Gastrointestinal: Soft and nontender. No distention. Genitourinary: Deferred. Musculoskeletal: No lower extremity tenderness nor edema.  Neurologic:  Normal speech and language. No gross focal neurologic deficits are appreciated.    ED Results / Procedures / Treatments   Labs (all labs ordered are listed, but only abnormal results are displayed) Labs Reviewed  BASIC METABOLIC PANEL - Abnormal; Notable for the following components:      Result Value   Sodium 134 (*)    Glucose, Bld 101 (*)    BUN 24 (*)    Calcium 8.7 (*)    All other components within normal limits  URINALYSIS, ROUTINE W REFLEX MICROSCOPIC - Abnormal; Notable for the following components:   Color, Urine YELLOW (*)    APPearance CLEAR (*)    Leukocytes,Ua TRACE (*)    All other components within normal limits  CBC  CBG MONITORING, ED  TROPONIN I (HIGH SENSITIVITY)     EKG  ED ECG REPORT I, Blake Divine, the attending physician, personally viewed and interpreted this ECG.   Date: 10/15/2021  EKG Time: 15:25  Rate: 88  Rhythm: normal sinus rhythm, PAC's noted  Axis: LAD  Intervals:right bundle branch block and left anterior fascicular block  ST&T Change: None  RADIOLOGY Chest x-ray reviewed by me and shows cardiomegaly with no infiltrate, edema, or effusion.  PROCEDURES:  Critical Care performed: No  .1-3 Lead EKG Interpretation Performed by: Blake Divine, MD Authorized by: Blake Divine, MD     Interpretation: normal     ECG rate:  75-90   ECG rate assessment: normal     Rhythm: sinus rhythm     Ectopy: PAC     Conduction: normal     MEDICATIONS ORDERED IN ED: Medications - No data to display   IMPRESSION / MDM / [place] / ED COURSE  I reviewed the triage vital signs and the nursing notes.                              86 y.o. female with past  medical history of hypertension, COPD, migraines, bronchiectasis, and DVT status post IVC filter who presents to the ED with intermittent episodes of lightheadedness and mild difficulty breathing with exertion that has been occurring for about 2 years, recently increasing in frequency.  Differential diagnosis includes, but is not limited to, ACS, arrhythmia, dehydration, electrolyte abnormality, pneumonia.  Patient is well-appearing and in no acute distress, vital signs are reassuring and remarkable only for hypertension.  EKG shows bifascicular block similar to previous with occasional PACs that are also similar to previous, no ischemic changes noted.  Troponin is within normal limits and I doubt ACS, especially given symptoms occurring for multiple days now.  Additional labs are unremarkable with no anemia or electrolyte abnormality.  We will check orthostatic vital signs and observe on cardiac monitor for any evidence of arrhythmia.  If this is unremarkable, patient will be appropriate for outpatient follow-up with cardiology.  The patient is on the cardiac monitor to evaluate for evidence of arrhythmia and/or significant heart rate changes.  No evidence of arrhythmia or ischemia noted on cardiac monitor, patient does have frequent PACs.  Orthostatic vital signs are unremarkable and patient had no lightheadedness with positional changes.  Given reassuring work-up, she is appropriate for outpatient management with cardiology follow-up.  Blood pressure noted to be elevated at the time of discharge, however patient has a history of hypertension and has not yet had her evening meds.  She was offered doses of these medications here in the ED, but declines and prefers to take her medication when she gets home.  She was counseled to return to the ED for new worsening symptoms, patient agrees with plan.      FINAL CLINICAL IMPRESSION(S) / ED DIAGNOSES   Final diagnoses:  Dizziness  Lightheadedness   Primary hypertension     Rx / DC Orders   ED Discharge Orders     None        Note:  This document was prepared using Dragon voice recognition software and may include unintentional dictation errors.   Blake Divine, MD 10/15/21 2245

## 2021-10-15 NOTE — ED Triage Notes (Signed)
Pt to ED via POV with c/o dizziness for a week now, she has called her PMD for the last three days and they told her to come here for a work up. She state that the dizziness is getting more frequent

## 2021-10-15 NOTE — Telephone Encounter (Signed)
Patient is calling --   She stated that even on the meclizine she is still getting dizzy.  Patient has almost fallen a few times but has caught herself.   Patient states she is extremely weak and these seem to follow after she eats -- but not every time.   Patient states her BP is normal but would not provide any reading to me.

## 2021-10-15 NOTE — ED Provider Triage Note (Signed)
°  Emergency Medicine Provider Triage Evaluation Note  Lindsey [name] , a 86 y.o.female,  was evaluated in triage.  Pt complains of dizziness.  Patient states that for the past week, whenever she stands she begins to feel dizzy and lightheaded.  She says she is completely fine whenever she sits down.  She called her primary care provider and they told her to report here.  She states the dizziness is getting more frequent.  Denies fevers or chills, chest pain, shortness of breath, abdominal pain, back pain, or urinary symptoms.   Review of Systems  Positive: Dizziness. Negative: Denies fever, chest pain, vomiting  Physical Exam   Vitals:   10/15/21 1522  BP: (!) 161/90  Pulse: 87  Resp: 20  Temp: 98.6 F (37 C)  SpO2: 96%   Gen:   Awake, no distress   Resp:  Normal effort  MSK:   Moves extremities without difficulty  Other:    Medical Decision Making  Given the patient's initial medical screening exam, the following diagnostic evaluation has been ordered. The patient will be placed in the appropriate treatment space, once one is available, to complete the evaluation and treatment. I have discussed the plan of care with the patient and I have advised the patient that an ED physician or mid-level practitioner will reevaluate their condition after the test results have been received, as the results may give them additional insight into the type of treatment they may need.    Diagnostics: Labs, EKG, CXR  Treatments: none immediately   Teodoro Spray, [place] 10/15/21 1533

## 2021-10-22 NOTE — Progress Notes (Signed)
Cardiology Office Note:    Date:  10/23/2021   ID:  Lindsey [name], DOB 23-Jan-1936, MRN [id number]  PCP:  Tracie Harrier, MD   [facility] Providers Cardiologist:  Ida Rogue, MD     Referring MD: Tracie Harrier, MD   Chief Complaint: Lightheadedness and disorientation  History of Present Illness:    Lindsey [name] is a very pleasant 86 y.o. female with a hx of labile BP, TIA, venous insufficiency, right knee surgery and right DVT s/p iVC filter, mild to moderate MR, carotid artery disease, leg swelling, and bronchiectasis.   She established care with Dr Rockey Situ in 2015 for evaluation of leg swelling and labile blood pressure.   She was last seen in our office by Dr. Rockey Situ on 09/16/21. Dr. Rockey Situ added furosemide 20 mg to 3 times per week for leg swelling and bisoprolol 5 mg daily was added for atrial tachycardia noted on EKG. She was advised to reduce telmisartan to 40 mg at night if SBP > 140. She was advised to f/u in 6 months.   She presented to the [facility] ED on 10/15/21 for evaluation of dizziness. There was no evidence of arrhythmia or ischemia on cardiac monitor with the exception of frequent PVCs. Orthostatic VS were unremarkable. Hs troponins were within normal range. She was discharged and advised to f/u with cardiology as OP.   Today, she is here alone. She denies chest pain, lower extremity edema, fatigue, palpitations, melena, hematuria, hemoptysis, diaphoresis, presyncope, syncope, orthopnea, and PND. She denies dizziness. Reports 1 episode of chest pain during the night approximately 2 to 3 months ago that lasted about 2 minutes. Described as generalized pain, no heart racing.  She is uncertain as to whether it woke her up or she was already awake but it resolved on its own. Reports a noticeable increase in shortness of breath since she had knee surgery 1 year ago.  Recently had to stop 2 times on a walk up a hill with her family. She reports the symptom of  dizziness that sent her to the emergency department on 10/15/2021 was a feeling of weakness and disorientation, her legs felt very heavy, and she was not sure if she could take the next step. She denies pre-syncope, syncope.  Her blood pressure continues to be labile at home.She has continued to take telmisartan daily although she was advised to take it only if SBP > 140 at last office visit.  Has hydralazine 25 mg to take for SBP > 170, has taken 1 pill in the last month.  Reports leg edema has improved with Lasix.  Does not monitor water intake carefully but thinks she drinks at least 4 12 oz glasses of water daily.  States she eats on a regular basis and does not go long periods of time without meals.  She has never monitored her blood sugar.She occasionally feels increased lightheadedness with leaning forward.    Past Medical History:  Diagnosis Date   Anemia    Atrial tachycardia (HCC)    Breast cancer ([place])    Bronchiectasis ([place]) 06/2007   Chronic Dizziness    Colitis    COPD (chronic obstructive pulmonary disease) (HCC)    Depression    Diastolic dysfunction    a. 12/2019 Echo: EF 60-65%, no rwma, GrII DD. Nl RV size/fxn. Mild MR/AI.   DVT (deep venous thrombosis) ([place])    after her right knee surgery.    Fibromyalgia    Labile Hypertension    Migraine  headache with aura    Osteoarthritis    Osteoporosis    PAD (peripheral artery disease) ([place])    Pneumonia    Polio 1952   Stenosis of carotid artery     Past Surgical History:  Procedure Laterality Date   ABDOMINAL HYSTERECTOMY     BACK SURGERY     BILATERAL TOTAL MASTECTOMY WITH AXILLARY LYMPH NODE DISSECTION     BREAST SURGERY     CARPAL TUNNEL RELEASE     bilateral    COLONOSCOPY WITH PROPOFOL N/A 08/02/2015   Procedure: COLONOSCOPY WITH PROPOFOL;  Surgeon: Manya Silvas, MD;  Location: [facility] ENDOSCOPY;  Service: Endoscopy;  Laterality: N/A;   FINGER TENDON REPAIR     HERNIA REPAIR     REPLACEMENT TOTAL KNEE      right knee    SQUAMOUS CELL CARCINOMA EXCISION     TONSILLECTOMY     TOTAL KNEE ARTHROPLASTY Left 09/19/2020   Procedure: TOTAL KNEE ARTHROPLASTY;  Surgeon: Hessie Knows, MD;  Location: [facility] ORS;  Service: Orthopedics;  Laterality: Left;   UMBILICAL HERNIA REPAIR     VAGINAL HYSTERECTOMY      Current Medications: Current Meds  Medication Sig   ARIPiprazole (ABILIFY) 2 MG tablet Take 2 mg by mouth daily.    bisoprolol (ZEBETA) 5 MG tablet Take 1 tablet (5 mg total) by mouth daily.   cyclobenzaprine (FLEXERIL) 5 MG tablet Take 5 mg by mouth at bedtime as needed.   doxazosin (CARDURA) 2 MG tablet Take 1 tablet (2 mg total) by mouth 2 (two) times daily.   Galcanezumab-gnlm (EMGALITY) 120 MG/ML SOAJ Inject 120 mg into the skin every 30 (thirty) days.   hydrALAZINE (APRESOLINE) 25 MG tablet Take 1 tablet (25 mg total) by mouth as needed (for BP >170).   hydrOXYzine (ATARAX/VISTARIL) 25 MG tablet Take 25 mg by mouth as needed for anxiety.   NON FORMULARY Take 2 capsules by mouth in the morning and at bedtime. Algaecal   NON FORMULARY Take 2 capsules by mouth daily. Strontium Boost   polyethylene glycol (MIRALAX / GLYCOLAX) packet Take 17 g by mouth daily as needed for mild constipation or moderate constipation.    potassium chloride (KLOR-CON M) 10 MEQ tablet TAKE 1 TABLET BY MOUTH ONCE DAILY   rizatriptan (MAXALT) 5 MG tablet Take 5 mg by mouth as needed for migraine.    SYMBICORT 160-4.5 MCG/ACT inhaler SMARTSIG:2 Inhalation Via Inhaler Twice Daily   telmisartan (MICARDIS) 40 MG tablet Take in the evening for BP>140   venlafaxine XR (EFFEXOR-XR) 75 MG 24 hr capsule Take 75 mg by mouth daily with breakfast.   [DISCONTINUED] furosemide (LASIX) 20 MG tablet Take Mon, Wed, & Fri   [DISCONTINUED] hydrochlorothiazide (HYDRODIURIL) 25 MG tablet TAKE 1 TABLET BY MOUTH ONCE DAILY     Allergies:   Ivp dye [iodinated contrast media], Codeine, Demeclocycline, Sulfa antibiotics, Tegaderm ag mesh  [silver], and Tetracyclines & related   Social History   Socioeconomic History   Marital status: Widowed    Spouse name: Not on file   Number of children: Not on file   Years of education: Not on file   Highest education level: Not on file  Occupational History   Not on file  Tobacco Use   Smoking status: Never   Smokeless tobacco: Never  Vaping Use   Vaping Use: Never used  Substance and Sexual Activity   Alcohol use: Yes    Alcohol/week: 1.0 standard drink    Types: 1  Glasses of wine per week    Comment: 3 glasses per week   Drug use: No   Sexual activity: Not on file  Other Topics Concern   Not on file  Social History Narrative   Not on file   Social Determinants of Health   Financial Resource Strain: Not on file  Food Insecurity: Not on file  Transportation Needs: Not on file  Physical Activity: Not on file  Stress: Not on file  Social Connections: Not on file     Family History: The patient's family history includes Atrial fibrillation in her sister.  ROS:   Please see the history of present illness. All other systems reviewed and are negative.  Labs/Other Studies Reviewed:    The following studies were reviewed today:  Echo 3/21  Left Ventricle: Left ventricular ejection fraction, by estimation, is 60  to 65%. The left ventricle has normal function. The left ventricle has no  regional wall motion abnormalities. The left ventricular internal cavity  size was normal in size. There is no left ventricular hypertrophy. Left ventricular diastolic parameters are consistent with Grade II diastolic dysfunction (pseudonormalization).  Right Ventricle: The right ventricular size is normal. No increase in  right ventricular wall thickness. Right ventricular systolic function is  normal.  Left Atrium: Left atrial size was normal in size.  Right Atrium: Right atrial size was normal in size.  Pericardium: There is no evidence of pericardial effusion.  Mitral Valve:  The mitral valve is normal in structure. Mild mitral valve  regurgitation.  Tricuspid Valve: The tricuspid valve is normal in structure. Tricuspid  valve regurgitation is mild.  Aortic Valve: The aortic valve is normal in structure. Aortic valve  regurgitation is mild. Aortic regurgitation PHT measures 378 msec.  Pulmonic Valve: The pulmonic valve was normal in structure. Pulmonic valve  regurgitation is not visualized.  Aorta: The aortic root is normal in size and structure.  IAS/Shunts: The atrial septum is grossly normal.   Venous US LLE 10/21/20  IMPRESSION: 1. Negative for acute DVT of the left lower extremity 2. Complex left popliteal fossa cyst  Recent Labs: 10/15/2021: BUN 24; Creatinine, Ser 0.73; Hemoglobin 13.8; Platelets 211; Potassium 4.3; Sodium 134  Recent Lipid Panel    Component Value Date/Time   CHOL 204 (H) 01/04/2020 0455   TRIG 37 01/04/2020 0455   HDL 113 01/04/2020 0455   CHOLHDL 1.8 01/04/2020 0455   VLDL 7 01/04/2020 0455   LDLCALC 84 01/04/2020 0455     Risk Assessment/Calculations:      Physical Exam:    VS:  BP (!) 179/85 (BP Location: Right Arm, Patient Position: Sitting, Cuff Size: Normal)    Pulse 80    Ht 5\' 2"  (1.575 m)    Wt 128 lb (58.1 kg)    SpO2 96%    BMI 23.41 kg/m     Wt Readings from Last 3 Encounters:  10/23/21 128 lb (58.1 kg)  10/15/21 125 lb (56.7 kg)  09/16/21 129 lb 2 oz (58.6 kg)     GEN:  Well nourished, well developed in no acute distress HEENT: Normal NECK: No JVD; No carotid bruits CARDIAC: Irregular RR, no murmurs, rubs, gallops RESPIRATORY:  Clear to auscultation without rales, wheezing or rhonchi  ABDOMEN: Soft, non-tender, non-distended MUSCULOSKELETAL:  No edema; No deformity. 2+ pedal pulses, equal bilaterally SKIN: Warm and dry NEUROLOGIC:  Alert and oriented x 3 PSYCHIATRIC:  Normal affect   EKG:  EKG is ordered today.  The ekg  ordered today demonstrates SR at 80 bpm with frequent PACs, RBBB, LAFB, no  acute change from previous  Diagnoses:    1. SOB (shortness of breath)   2. Weakness   3. Dyspnea on exertion   4. Leg swelling   5. Atrial tachycardia (HCC)   6. Labile blood pressure    Assessment and Plan:     Weakness: Reports increased lightheadedness, disorientation, severe leg weakness over the last year.  Recent work-up in the emergency department was negative for ACS, stroke, arrhythmia. Consideration was given to her current medication regimen and whether she may be having occasional hypotension. We will d/c hctz and have her take furosemide daily. Encouraged her to continue to monitor BP. Additionally, encouraged her to seek glucose monitoring by the nursing staff at her assisted living facility. She will increase glucose intake at the time of the next episode of weakness to see if this helps. We will place a 3 day monitor for clarification of arrhythmia causing weakness.    Dyspnea: She notes significant increase in shortness of breath since having knee surgery 1 year ago.  She has had some leg swelling but states that has improved since being on furosemide and it is not present today.  We will get an echocardiogram for evaluation of heart and valve function.  Labile blood pressure: We will stop hydrochlorothiazide in an effort to decrease the number of medications she is taking. We will have her increase Lasix to 20 mg daily. Encouraged her to continue to monitor blood pressure and notify us for consistently elevated or low readings. Orthostatic blood pressure readings in the office revealed a 20 point drop in systolic blood pressure from sitting to standing.  After standing for 3 minutes, SBP significantly increased by 40 points.  As noted below, if patient has frequent PACs by cardiac monitor could consider increasing bisoprolol.  Additional consideration could be maximizing another medication in the place of as needed hydralazine. Will get bmet at next office visit.   Frequent  PACs/Atrial tachycardia: EKG today reveals frequent PACs, no atrial tachycardia. She denies frequent palpitations. We will place a 3-day monitor for quantification of PACs and get an echo to evaluated LVEF function.  If PACs are frequent, would consider increasing bisoprolol.   Disposition: 1 month with Dr. Rockey Situ or APP  Medication Adjustments/Labs and Tests Ordered: Current medicines are reviewed at length with the patient today.  Concerns regarding medicines are outlined above.  Orders Placed This Encounter  Procedures   LONG TERM MONITOR (3-14 DAYS)   EKG 12-Lead   ECHOCARDIOGRAM COMPLETE   Meds ordered this encounter  Medications   furosemide (LASIX) 20 MG tablet    Sig: Take 1 tablet (20 mg total) by mouth daily. Take Mon, Wed, & Fri    Dispense:  90 tablet    Refill:  3    Patient Instructions  Medication Instructions:  Your physician has recommended you make the following change in your medication:   STOP Hydrochlorothiazide START Furosemide (Lasix) 20 mg once daily  *If you need a refill on your cardiac medications before your next appointment, please call your pharmacy*   Lab Work: None  If you have labs (blood work) drawn today and your tests are completely normal, you will receive your results only by: [place] (if you have MyChart) OR A paper copy in the mail If you have any lab test that is abnormal or we need to change your treatment, we will call you to review  the results.   Testing/Procedures: Your physician has requested that you have an echocardiogram. Echocardiography is a painless test that uses sound waves to create images of your heart. It provides your doctor with information about the size and shape of your heart and how well your hearts chambers and valves are working. This procedure takes approximately one hour. There are no restrictions for this procedure.  Your provider has ordered a heart monitor to wear for 3 days. This will be mailed  to your home with instructions on placement. Once you have finished the time frame requested, you will return monitor in box provided.      Follow-Up: At [facility], you and your health needs are our priority.  As part of our continuing mission to provide you with exceptional heart care, we have created designated Provider Care Teams.  These Care Teams include your primary Cardiologist (physician) and Advanced Practice Providers (APPs -  Physician Assistants and Nurse Practitioners) who all work together to provide you with the care you need, when you need it.   Your next appointment:   1 month(s)  The format for your next appointment:   In Person  Provider:   You may see Ida Rogue, MD or one of the following Advanced Practice Providers on your designated Care Team:   Murray Hodgkins, NP Christell Faith, PA-C Cadence Kathlen Mody, PA-C{   Signed, Emmaline Life, NP  10/23/2021 1:53 PM    [place]

## 2021-10-23 ENCOUNTER — Ambulatory Visit: Payer: Medicare Other | Admitting: Nurse Practitioner

## 2021-10-23 ENCOUNTER — Other Ambulatory Visit: Payer: Self-pay

## 2021-10-23 ENCOUNTER — Encounter: Payer: Self-pay | Admitting: Nurse Practitioner

## 2021-10-23 ENCOUNTER — Ambulatory Visit (INDEPENDENT_AMBULATORY_CARE_PROVIDER_SITE_OTHER): Payer: Medicare Other

## 2021-10-23 VITALS — BP 179/85 | HR 80 | Ht 62.0 in | Wt 128.0 lb

## 2021-10-23 DIAGNOSIS — I471 Supraventricular tachycardia: Secondary | ICD-10-CM

## 2021-10-23 DIAGNOSIS — R0609 Other forms of dyspnea: Secondary | ICD-10-CM | POA: Diagnosis not present

## 2021-10-23 DIAGNOSIS — M7989 Other specified soft tissue disorders: Secondary | ICD-10-CM | POA: Diagnosis not present

## 2021-10-23 DIAGNOSIS — R0602 Shortness of breath: Secondary | ICD-10-CM | POA: Diagnosis not present

## 2021-10-23 DIAGNOSIS — R531 Weakness: Secondary | ICD-10-CM | POA: Diagnosis not present

## 2021-10-23 DIAGNOSIS — R0989 Other specified symptoms and signs involving the circulatory and respiratory systems: Secondary | ICD-10-CM

## 2021-10-23 MED ORDER — FUROSEMIDE 20 MG PO TABS: 20.0000 mg | ORAL_TABLET | Freq: Every day | ORAL | 3 refills | Status: DC

## 2021-10-23 MED ORDER — TELMISARTAN 40 MG PO TABS: 40.0000 mg | ORAL_TABLET | Freq: Every day | ORAL | 3 refills | Status: DC

## 2021-10-23 NOTE — Patient Instructions (Signed)
Medication Instructions:  Your physician has recommended you make the following change in your medication:   STOP Hydrochlorothiazide START Furosemide (Lasix) 20 mg once daily  *If you need a refill on your cardiac medications before your next appointment, please call your pharmacy*   Lab Work: None  If you have labs (blood work) drawn today and your tests are completely normal, you will receive your results only by: [place] (if you have MyChart) OR A paper copy in the mail If you have any lab test that is abnormal or we need to change your treatment, we will call you to review the results.   Testing/Procedures: Your physician has requested that you have an echocardiogram. Echocardiography is a painless test that uses sound waves to create images of your heart. It provides your doctor with information about the size and shape of your heart and how well your hearts chambers and valves are working. This procedure takes approximately one hour. There are no restrictions for this procedure.  Your provider has ordered a heart monitor to wear for 3 days. This will be mailed to your home with instructions on placement. Once you have finished the time frame requested, you will return monitor in box provided.      Follow-Up: At [facility], you and your health needs are our priority.  As part of our continuing mission to provide you with exceptional heart care, we have created designated Provider Care Teams.  These Care Teams include your primary Cardiologist (physician) and Advanced Practice Providers (APPs -  Physician Assistants and Nurse Practitioners) who all work together to provide you with the care you need, when you need it.   Your next appointment:   1 month(s)  The format for your next appointment:   In Person  Provider:   You may see Ida Rogue, MD or one of the following Advanced Practice Providers on your designated Care Team:   Murray Hodgkins, NP Christell Faith, PA-C Cadence Kathlen Mody, [place]

## 2021-10-25 DIAGNOSIS — I471 Supraventricular tachycardia: Secondary | ICD-10-CM | POA: Diagnosis not present

## 2021-10-25 DIAGNOSIS — R0609 Other forms of dyspnea: Secondary | ICD-10-CM | POA: Diagnosis not present

## 2021-10-30 ENCOUNTER — Telehealth: Payer: Self-pay | Admitting: Cardiovascular Disease

## 2021-10-30 NOTE — Telephone Encounter (Signed)
Was able to return call to Lindsey [name] to verify her Telmisartan dosage.  Per OV notes from Dr. Rockey Situ on 12/13  Labile blood pressure Well-controlled on today's visit Medication changes made given tachypalpitations, will add bisoprolol 5 mg daily, Lasix 3 times a week for shortness of breath and trace ankle swelling concern for elevated right heart pressures in the setting of bronchiectasis/lung disease, Decrease telmisartan down to 40 in the evening   AVS instructions: Telmisartan 40 mg in the evening Only take the telmisartan in the PM if pressure high >140  Lindsey [name] reports she had her old script for Telmisartan 80 mg daily and got confused on what dosing she needed to take. Was able to review with her the decrease to 40 mg in the evening for BP>140. Lindsey [name] verbalized understanding and thankful for the return call and instructions/clarification.

## 2021-10-30 NOTE — Telephone Encounter (Signed)
Patient has two different dosages of telmisartan and would like to clarify which to take Please call to discuss

## 2021-10-31 ENCOUNTER — Ambulatory Visit: Payer: PPO | Admitting: Nurse Practitioner

## 2021-11-07 ENCOUNTER — Telehealth: Payer: Self-pay | Admitting: Cardiovascular Disease

## 2021-11-07 DIAGNOSIS — R0989 Other specified symptoms and signs involving the circulatory and respiratory systems: Secondary | ICD-10-CM

## 2021-11-07 DIAGNOSIS — I10 Essential (primary) hypertension: Secondary | ICD-10-CM

## 2021-11-07 NOTE — Telephone Encounter (Signed)
Called patient back.   Pt reports that she hasn't had any dizziness since stopping telmisartan daily.   She had to take telmisartan last night due to a BP with a SBP greater than 150, and reports that when she got up in the middle of the night to go to the bathroom she was very dizzy, and "had to stay glued to the wall" so she wouldn't fall. Pt is sure that her dizziness is from telmisartan, and wants to know if she can be switched to something else.   Recommended to pt not to take telmisartan, and to use hydralazine for HTN over the weekend if necessary. Told pt that I will send message to Dr. Rockey Situ and his RN so that they can follow up and tell her what she should take instead for the long term. Pt verbalized understanding of all.   Patient voiced appreciation for the call and understands that they can reach out to our office with any questions or concerns.

## 2021-11-07 NOTE — Telephone Encounter (Signed)
Pt c/o medication issue:  1. Name of Medication: micardis 40mg    2. How are you currently taking this medication (dosage and times per day)? Taken as needed when BP is up  3. Are you having a reaction (difficulty breathing--STAT)? dizzy  4. What is your medication issue? Ongoing

## 2021-11-10 MED ORDER — HYDRALAZINE HCL 25 MG PO TABS: 25.0000 mg | ORAL_TABLET | ORAL | 0 refills | Status: DC | PRN

## 2021-11-10 NOTE — Addendum Note (Signed)
Addended by: Wynema Birch on: 11/10/2021 09:13 AM   Modules accepted: Orders

## 2021-11-10 NOTE — Telephone Encounter (Signed)
Was able to reach back out to [name] to f/u on last week's concern for BP. Pt reprots has been better with taking the PRN hydralazine and has stopped the telmisartan.   Advised Dr. Rockey Situ advised Hard to determine what to do with only 1 blood pressure number  Not sure if she was dehydrated that day,  Not sure whether to do half dose telmisartan, 20 mg  Or just use hydralazine as needed  We need more numbers at various times in the day  For now maybe hold the telmisartan just use hydralazine as needed in the evening, over 150  Thx  TG   Mrs. Bronder verbalized understanding, request refill to be sent, refill to [facility]. Also advised on recording BP log to bring to upcoming appt on 2/24 at 09:40 am with Dr. Rockey Situ, suggest taking her BO while at rest and 2 hrs after her BP meds. Bring log to her appt so Dr. Rockey Situ can review and make changes if needed at that time.  Otherwise all questions were address and no additional concerns at this time. Mrs. Clingerman thankful for the return call and advice. Agreeable to plan, will call back for anything further.

## 2021-11-24 ENCOUNTER — Ambulatory Visit (INDEPENDENT_AMBULATORY_CARE_PROVIDER_SITE_OTHER): Payer: Medicare Other

## 2021-11-24 ENCOUNTER — Telehealth: Payer: Self-pay | Admitting: *Deleted

## 2021-11-24 ENCOUNTER — Other Ambulatory Visit: Payer: Self-pay

## 2021-11-24 DIAGNOSIS — R0609 Other forms of dyspnea: Secondary | ICD-10-CM

## 2021-11-24 DIAGNOSIS — R0602 Shortness of breath: Secondary | ICD-10-CM | POA: Diagnosis not present

## 2021-11-24 DIAGNOSIS — I471 Supraventricular tachycardia: Secondary | ICD-10-CM | POA: Diagnosis not present

## 2021-11-24 DIAGNOSIS — M7989 Other specified soft tissue disorders: Secondary | ICD-10-CM

## 2021-11-24 LAB — ECHOCARDIOGRAM COMPLETE
AR max vel: 2.03 cm2
AV Area VTI: 1.92 cm2
AV Area mean vel: 1.94 cm2
AV Mean grad: 5 mmHg
AV Peak grad: 9.2 mmHg
AV Vena cont: 0.3 cm
Ao pk vel: 1.52 m/s
Area-P 1/2: 2.61 cm2
Calc EF: 60.8 %
P 1/2 time: 1258 msec
S' Lateral: 3.1 cm
Single Plane A2C EF: 64.9 %
Single Plane A4C EF: 54.9 %

## 2021-11-24 NOTE — Telephone Encounter (Signed)
Reviewed results and recommendations with patient. She verbalized understanding and confirmed appointment for this Friday with Dr. Rockey Situ.

## 2021-11-24 NOTE — Telephone Encounter (Signed)
Left voicemail message to call back for review of results.  

## 2021-11-24 NOTE — Telephone Encounter (Signed)
-----   Message from Emmaline Life, NP sent at 11/24/2021  2:30 PM EST ----- LV function is normal with EF 55-60%, no regional wall motion abnormalities, mild left ventricular hypertrophy. Normal RV. Mildly dilated LA and RA. Mild MR, mild AR. Overall, stable report. No changes to medical therapy. Continue plan as outline at office visit.

## 2021-11-27 NOTE — Progress Notes (Signed)
Cardiology Office Note  Date:  11/28/2021   ID:  Lindsey, [name] 1936-07-05, MRN [id number]  PCP:  Tracie Harrier, MD   Chief Complaint  Patient presents with   Follow up [place] & Echo results.     "Doing well." Medications reviewed by the patient verbally.     HPI:  Lindsey [name] is a pleasant 86 year old woman with history of  lower extremity swelling,Secondary to venous insufficiency  prior right knee surgery,  right DVT, status post IVC filter,  bronchiectasis on inhalers and nebulizers at home  Back surgery who presents for follow up of her labile blood pressure and her leg swelling.  Recently seen by one of our providers October 23, 2021 On that visit HCTZ was discontinued and she was started on Lasix daily Monitor was placed  [facility] ED on 10/15/21 for evaluation of dizziness.  weakness and disorientation, her legs felt very heavy, and she was not sure if she could take the next step.  There was no evidence of arrhythmia or ischemia on cardiac monitor with the exception of frequent PVCs. Orthostatic VS were unremarkable. Hs troponins were within normal range  Echocardiogram November 24, 2021 Reviewed on today's visit Normal LV function Normal RV function No significant valve disease  Zio monitor reviewed on today's visit Patient had a min HR of 40 bpm, max HR of 143 bpm, and avg HR of 55 bpm. Predominant underlying rhythm was Sinus Rhythm. 5 Supraventricular Tachycardia runs occurred, the run with  the fastest interval lasting 10 beats with a max rate of 143 bpm (avg 136 bpm); the run with the fastest interval was also the longest.  Supraventricular Tachycardia / Atrial Tachycardia Isolated SVEs  were frequent (12.9%, 81017), SVE Couplets were rare (<1.0%, 1059), and SVE Triplets were rare (<1.0%, 17).  Isolated VEs were rare (<1.0%), and no VE Couplets or VE Triplets were present.  Off telmistartan, stopped , "Causes dizziness"  H/a on migraine meds, had reaction,  itching  bronchiestasis managed with nebs, inhalers,  Chronic knee pain May have some leg swelling, mild  Taking "liver health issue"  EKG personally reviewed by myself on todays visit Shows normal sinus rhythm rate 61 bpm, frequent PACs  Other past medical history reviewed has carotid disease done through [facility], was told she had moderate disease  Does not want a statin  visit to the emergency room ER 6/28,2018 Reported having dizziness and shortness of breath. Presented relatively acutely  Was drinking extra wine ,  stood up she felt dizzy/lightheaded and "breathing heavily".  Symptoms lasted approximately 2 hours  resolved.  Workup in the emergency room was benign Lab work as detailed below discussed with her Sodium 134, potassium 3.2, BUN 21 She does not take potassium supplement on a daily basis  Leg swelling History dates back many years but started with right knee surgery after an accident, fracture. She suffered a DVT, had IVC filter placed. Swelling worse at the end of the day, better in the morning. She does have significant varicosities.   bronchiectasis diagnosed at outside facility.  stress test may 2015 which was reportedly normal  PMH:   has a past medical history of Anemia, Atrial tachycardia ([place]), Breast cancer ([place]), Bronchiectasis ([place]) (06/2007), Chronic Dizziness, Colitis, COPD (chronic obstructive pulmonary disease) ([place]), Depression, Diastolic dysfunction, DVT (deep venous thrombosis) ([place]), Fibromyalgia, Labile Hypertension, Migraine headache with aura, Osteoarthritis, Osteoporosis, PAD (peripheral artery disease) ([place]), Pneumonia, Polio (1952), and Stenosis of carotid artery.  PSH:    Past  Surgical History:  Procedure Laterality Date   ABDOMINAL HYSTERECTOMY     BACK SURGERY     BILATERAL TOTAL MASTECTOMY WITH AXILLARY LYMPH NODE DISSECTION     BREAST SURGERY     CARPAL TUNNEL RELEASE     bilateral    COLONOSCOPY WITH PROPOFOL N/A 08/02/2015    Procedure: COLONOSCOPY WITH PROPOFOL;  Surgeon: Manya Silvas, MD;  Location: [facility] ENDOSCOPY;  Service: Endoscopy;  Laterality: N/A;   FINGER TENDON REPAIR     HERNIA REPAIR     REPLACEMENT TOTAL KNEE     right knee    SQUAMOUS CELL CARCINOMA EXCISION     TONSILLECTOMY     TOTAL KNEE ARTHROPLASTY Left 09/19/2020   Procedure: TOTAL KNEE ARTHROPLASTY;  Surgeon: Hessie Knows, MD;  Location: [facility] ORS;  Service: Orthopedics;  Laterality: Left;   UMBILICAL HERNIA REPAIR     VAGINAL HYSTERECTOMY      Current Outpatient Medications on File Prior to Visit  Medication Sig Dispense Refill   ARIPiprazole (ABILIFY) 2 MG tablet Take 2 mg by mouth daily.      Aspirin 81 MG CAPS Take 81 mg by mouth daily.     bisoprolol (ZEBETA) 5 MG tablet Take 1 tablet (5 mg total) by mouth daily. 90 tablet 3   cyclobenzaprine (FLEXERIL) 5 MG tablet Take 5 mg by mouth at bedtime as needed.     doxazosin (CARDURA) 2 MG tablet Take 1 tablet (2 mg total) by mouth 2 (two) times daily. 180 tablet 3   furosemide (LASIX) 20 MG tablet Take 20 mg by mouth daily.     Galcanezumab-gnlm (EMGALITY) 120 MG/ML SOAJ Inject 120 mg into the skin every 30 (thirty) days.     hydrOXYzine (ATARAX/VISTARIL) 25 MG tablet Take 25 mg by mouth as needed for anxiety.     NON FORMULARY Take 2 capsules by mouth in the morning and at bedtime. Algaecal     NON FORMULARY Take 2 capsules by mouth daily. Strontium Boost     polyethylene glycol (MIRALAX / GLYCOLAX) packet Take 17 g by mouth daily as needed for mild constipation or moderate constipation.      potassium chloride (KLOR-CON M) 10 MEQ tablet TAKE 1 TABLET BY MOUTH ONCE DAILY 90 tablet 3   rizatriptan (MAXALT) 5 MG tablet Take 5 mg by mouth as needed for migraine.      SYMBICORT 160-4.5 MCG/ACT inhaler SMARTSIG:2 Inhalation Via Inhaler Twice Daily     venlafaxine XR (EFFEXOR-XR) 75 MG 24 hr capsule Take 75 mg by mouth daily with breakfast.     No current facility-administered  medications on file prior to visit.    Allergies:   Ivp dye [iodinated contrast media], Codeine, Demeclocycline, Sulfa antibiotics, Tegaderm ag mesh [silver], Telmisartan, and Tetracyclines & related   Social History:  The patient  reports that she has never smoked. She has never used smokeless tobacco. She reports current alcohol use of about 1.0 standard drink per week. She reports that she does not use drugs.   Family History:   family history includes Atrial fibrillation in her sister.    Review of Systems: Review of Systems  Constitutional: Negative.   HENT: Negative.    Respiratory: Negative.    Cardiovascular: Negative.   Gastrointestinal: Negative.   Neurological: Negative.   Psychiatric/Behavioral: Negative.    All other systems reviewed and are negative.  PHYSICAL EXAM: VS:  BP 130/80 (BP Location: Left Arm, Patient Position: Sitting, Cuff Size: Normal)  Pulse 61    Ht 5\' 2"  (1.575 m)    Wt 132 lb 6 oz (60 kg)    SpO2 97%    BMI 24.21 kg/m  , BMI Body mass index is 24.21 kg/m.  Constitutional:  oriented to person, place, and time. No distress.  HENT:  Head: Grossly normal Eyes:  no discharge. No scleral icterus.  Neck: No JVD, no carotid bruits  Cardiovascular: Regular rate and rhythm, no murmurs appreciated Pulmonary/Chest: Mildly decreased lung sounds, scattered Rales Abdominal: Soft.  no distension.  no tenderness.  Musculoskeletal: Normal range of motion Neurological:  normal muscle tone. Coordination normal. No atrophy Skin: Skin warm and dry Psychiatric: normal affect, pleasant   Recent Labs: 10/15/2021: BUN 24; Creatinine, Ser 0.73; Hemoglobin 13.8; Platelets 211; Potassium 4.3; Sodium 134    Lipid Panel Lab Results  Component Value Date   CHOL 204 (H) 01/04/2020   HDL 113 01/04/2020   LDLCALC 84 01/04/2020   TRIG 37 01/04/2020    Wt Readings from Last 3 Encounters:  11/28/21 132 lb 6 oz (60 kg)  10/23/21 128 lb (58.1 kg)  10/15/21 125 lb (56.7  kg)     ASSESSMENT AND PLAN:  Labile blood pressure Well-controlled on today's visit She stopped telmisartan on her own, no longer taking as needed Feels it was causing her general malaise weakness and dizziness Recommend she take hydralazine 25 mg for systolic pressure over 818 BMP today as she is on Lasix 20 daily  History of DVT (deep vein thrombosis) No recurrent episodes, stable  Bronchiectasis without complication (HCC) Reports symptoms are stable, Recommend she avoid sick contacts  Leg swelling taking Lasix 20 mg, lab work stable  PAD/carotid disease Does not want a statin,  Not taking Zetia, does not want it Mild carotid disease  Atrial tachycardia bisoprolol 5 mg daily, having PACs, no sx Discussed Zio monitor in detail, Rare tachycardia, PACs, no significant symptoms   Total encounter time more than 30 minutes  Greater than 50% was spent in counseling and coordination of care with the patient   Orders Placed This Encounter  Procedures   EKG 12-Lead      Signed, Esmond Plants, M.D., Ph.D. 11/28/2021  [place], [place]

## 2021-11-28 ENCOUNTER — Ambulatory Visit: Payer: Medicare Other | Admitting: Cardiovascular Disease

## 2021-11-28 ENCOUNTER — Encounter: Payer: Self-pay | Admitting: Cardiovascular Disease

## 2021-11-28 ENCOUNTER — Other Ambulatory Visit: Payer: Self-pay

## 2021-11-28 VITALS — BP 130/80 | HR 61 | Ht 62.0 in | Wt 132.4 lb

## 2021-11-28 DIAGNOSIS — I471 Supraventricular tachycardia: Secondary | ICD-10-CM

## 2021-11-28 DIAGNOSIS — I10 Essential (primary) hypertension: Secondary | ICD-10-CM | POA: Diagnosis not present

## 2021-11-28 DIAGNOSIS — R0602 Shortness of breath: Secondary | ICD-10-CM

## 2021-11-28 DIAGNOSIS — R0989 Other specified symptoms and signs involving the circulatory and respiratory systems: Secondary | ICD-10-CM

## 2021-11-28 DIAGNOSIS — Z79899 Other long term (current) drug therapy: Secondary | ICD-10-CM

## 2021-11-28 DIAGNOSIS — R0609 Other forms of dyspnea: Secondary | ICD-10-CM

## 2021-11-28 DIAGNOSIS — R531 Weakness: Secondary | ICD-10-CM

## 2021-11-28 MED ORDER — HYDRALAZINE HCL 25 MG PO TABS: 25.0000 mg | ORAL_TABLET | Freq: Three times a day (TID) | ORAL | 2 refills | Status: DC | PRN

## 2021-11-28 NOTE — Patient Instructions (Signed)
Medication Instructions:  No changes  If you need a refill on your cardiac medications before your next appointment, please call your pharmacy.   Lab work: [employer] today  Testing/Procedures: No new testing needed  Follow-Up: At [facility], you and your health needs are our priority.  As part of our continuing mission to provide you with exceptional heart care, we have created designated Provider Care Teams.  These Care Teams include your primary Cardiologist (physician) and Advanced Practice Providers (APPs -  Physician Assistants and Nurse Practitioners) who all work together to provide you with the care you need, when you need it.  You will need a follow up appointment in 12 months  Providers on your designated Care Team:   Murray Hodgkins, NP Christell Faith, PA-C Cadence Kathlen Mody, [place]  COVID-19 Vaccine Information can be found at: [email or web address].uk For questions related to vaccine distribution or appointments, please email [email or web address] or call [phone].

## 2021-11-29 LAB — BASIC METABOLIC PANEL WITH GFR
BUN/Creatinine Ratio: 26 (ref 12–28)
BUN: 18 mg/dL (ref 8–27)
CO2: 26 mmol/L (ref 20–29)
Calcium: 9.3 mg/dL (ref 8.7–10.3)
Chloride: 98 mmol/L (ref 96–106)
Creatinine, Ser: 0.68 mg/dL (ref 0.57–1.00)
Glucose: 70 mg/dL (ref 70–99)
Potassium: 4.7 mmol/L (ref 3.5–5.2)
Sodium: 138 mmol/L (ref 134–144)
eGFR: 86 mL/min/{1.73_m2}

## 2021-12-22 ENCOUNTER — Telehealth: Payer: Self-pay | Admitting: *Deleted

## 2021-12-22 NOTE — Telephone Encounter (Signed)
Received fax from [place] for clarification for prescription for Furosemide 20 mg. Please verify if patient soul be taking medication once a day or only MWF? Please send the corrected prescription to pharmacy on file. Thank you ?

## 2021-12-23 MED ORDER — FUROSEMIDE 20 MG PO TABS: 20.0000 mg | ORAL_TABLET | Freq: Every day | ORAL | 2 refills | Status: DC

## 2021-12-23 NOTE — Telephone Encounter (Signed)
Per last OV notes in Feb, pt is on Lasix 20 mg daily, no changes to medication made at that time.  ? ?Lasix 20 mg MWF was d/c Jan 2023 ? ?Script sent in to pharmacy as Lasix 20 mg daily  ?

## 2021-12-27 ENCOUNTER — Other Ambulatory Visit: Payer: Self-pay | Admitting: Cardiovascular Disease

## 2022-01-17 IMAGING — MR MR MRCP
11 of 12 series · 41 of 48 positions shown · non-contrast
Comparison: 12/07/2018 CT abdomen/pelvis.  02/11/2016 MRI abdomen.

CLINICAL DATA: Right abdominal pain and diarrhea for 2 months.
Pancreatic insufficiency.

EXAM:
MRI ABDOMEN WITHOUT CONTRAST  (INCLUDING MRCP)
TECHNIQUE: Multiplanar multisequence MR imaging of the abdomen was performed.
Heavily T2-weighted images of the biliary and pancreatic ducts were
obtained, and three-dimensional MRCP images were rendered by post
processing.

[Series 3: T2 · coronal · 6.0mm · 1.12mm/px · 1 of 27 slices shown (1 of 2)]
[im 1/27]
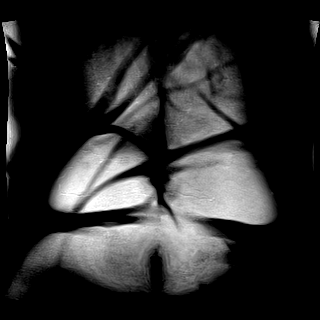

[Series 4: T2 · axial · 6.0mm · 1.12mm/px · z∈[-63,+161]mm · 2 of 32 slices shown (2 of 2)]
[im 1/32]
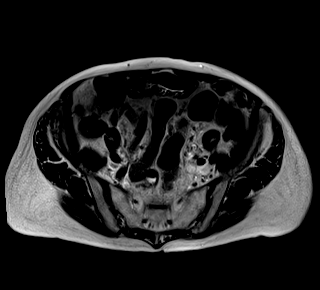
[im 32/32]
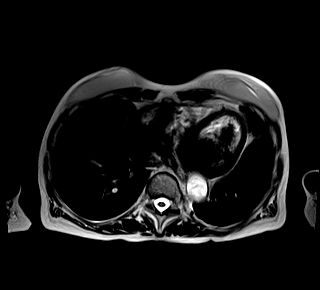

[Series 5: T1 · axial · 6.0mm · 0.74mm/px · z∈[-63,+161]mm · 3 of 32 slices shown (1 of 2)]
[im 1/32]
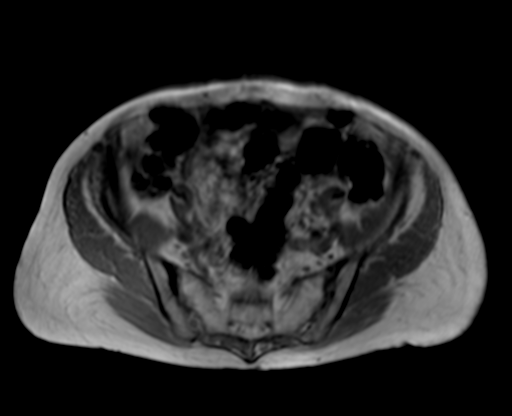
[im 16/32]
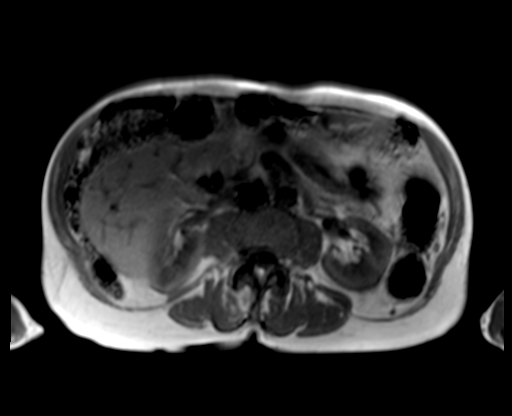
[im 32/32]
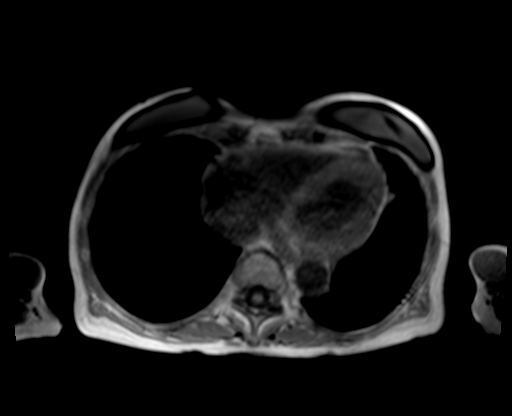

[Series 5: T1 · axial · 6.0mm · 0.74mm/px · z∈[-63,+161]mm · 3 of 32 slices shown (2 of 2)]
[im 1/32]
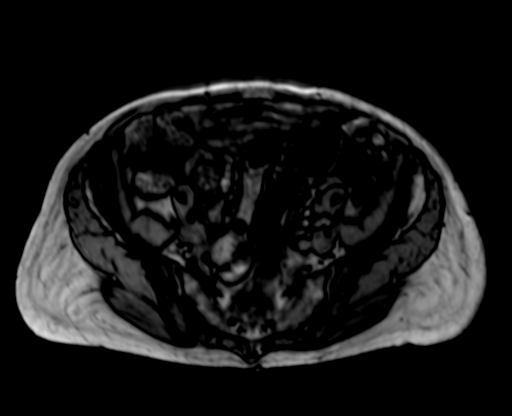
[im 16/32]
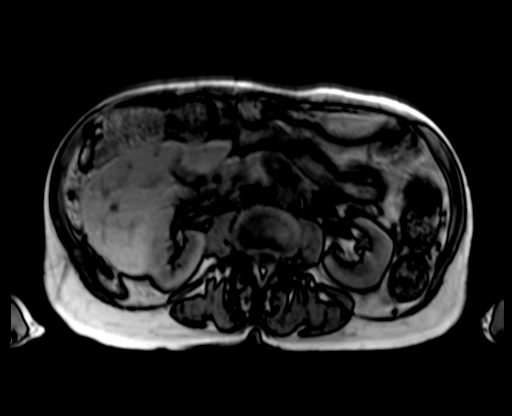
[im 32/32]
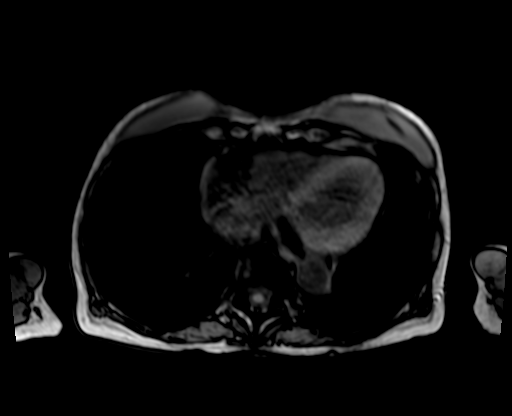

[Series 9: T2 fat-sat · axial · 6.0mm · 1.19mm/px · z∈[-9,+207]mm · 3 of 31 slices shown]
[im 1/31]
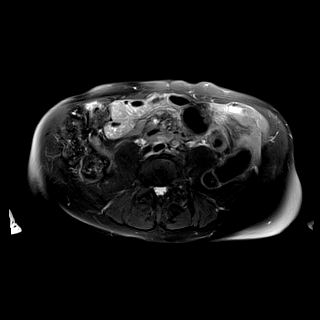
[im 16/31]
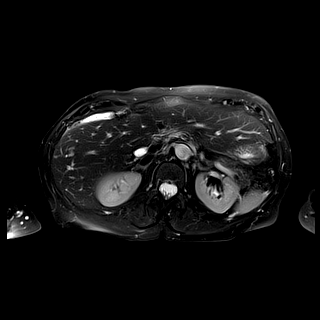
[im 31/31]
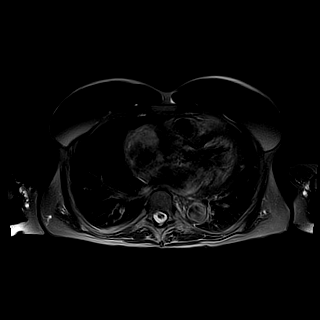

[Series 10: ax dwi_tracew · axial · 6.0mm · 1.42mm/px · z∈[-12,+211]mm · 9 of 96 slices shown]
[im 1/96]
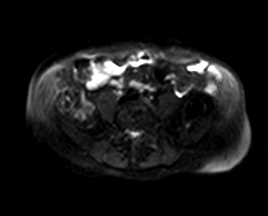
[im 12/96]
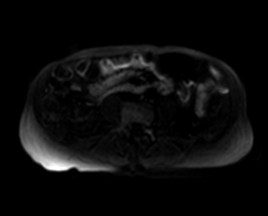
[im 24/96]
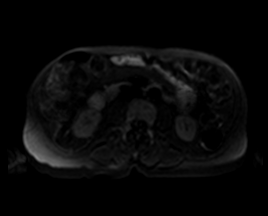
[im 36/96]
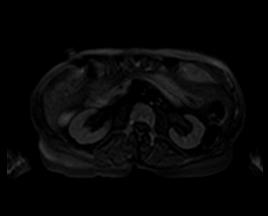
[im 48/96]
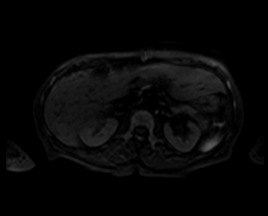
[im 60/96]
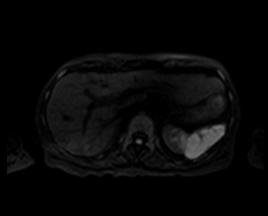
[im 72/96]
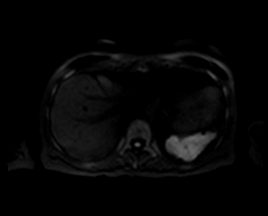
[im 84/96]
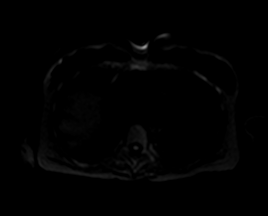
[im 96/96]
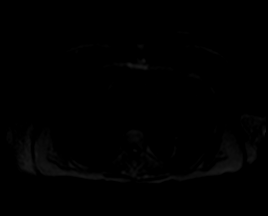

[Series 11: ax dwi_adc · axial · 6.0mm · 1.42mm/px · z∈[-12,+211]mm · 3 of 32 slices shown]
[im 1/32]
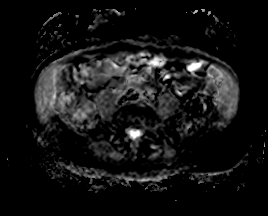
[im 16/32]
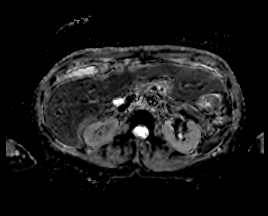
[im 32/32]
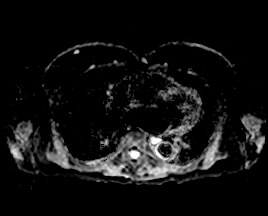

[Series 15: MRCP · coronal · 3.0mm · 1.12mm/px · 2 of 23 slices shown]
[im 1/23]
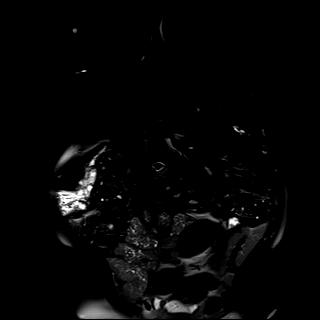
[im 23/23]
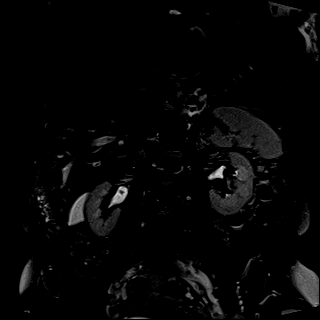

[Series 16: radials · coronal · 50.0mm · 0.78mm/px · 1 of 5 slices shown]
[im 1/5]
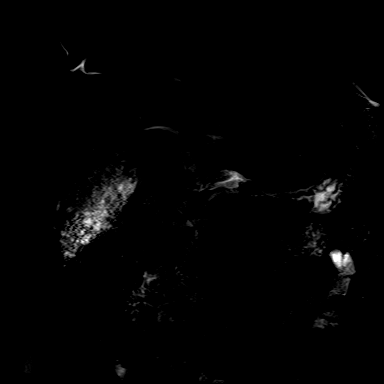

[Series 17: T1 dynamic fat-sat · axial · non-contrast · 3.0mm · 1.19mm/px · z∈[-58,+155]mm · 7 of 72 slices shown]
[im 1/72]
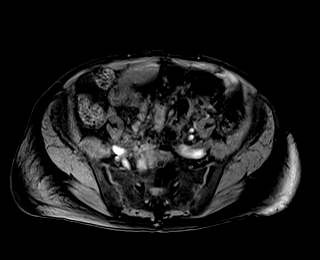
[im 12/72]
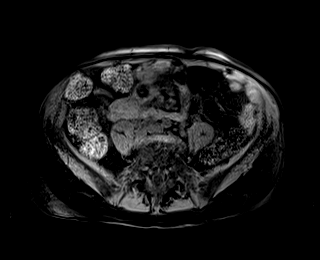
[im 24/72]
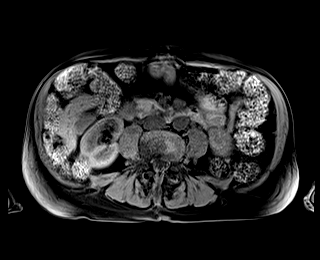
[im 36/72]
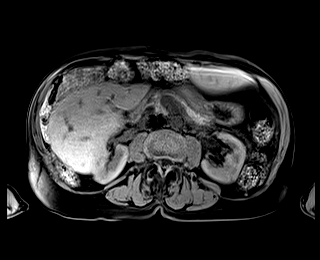
[im 48/72]
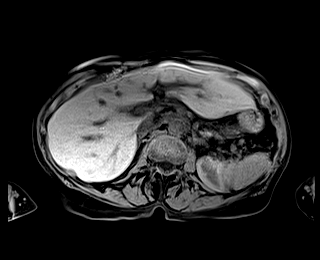
[im 60/72]
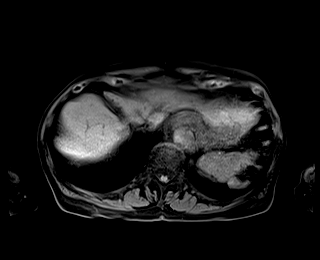
[im 72/72]
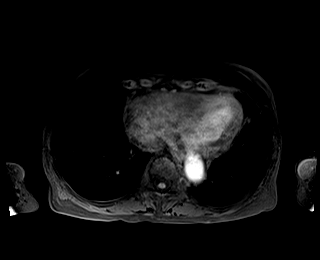

[Series 18: T1 dynamic post-contrast · coronal · 3.0mm · 1.31mm/px · 7 of 72 slices shown]
[im 1/72]
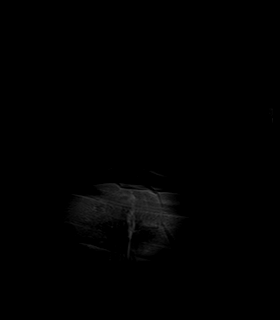
[im 12/72]
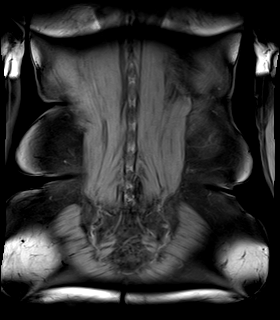
[im 24/72]
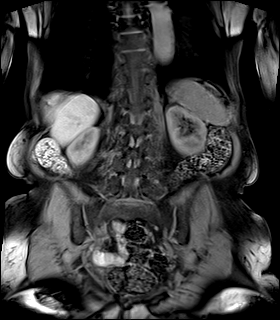
[im 36/72]
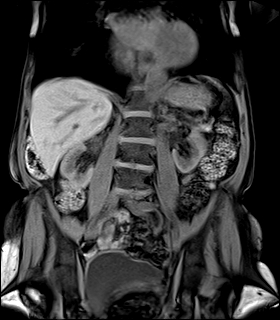
[im 48/72]
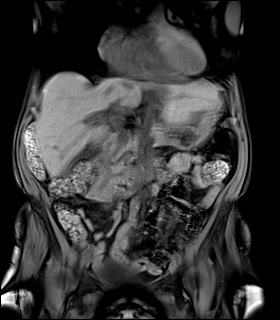
[im 60/72]
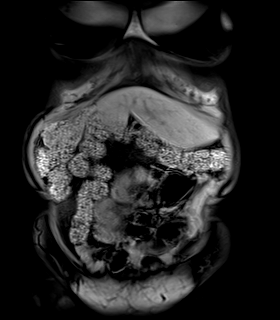
[im 72/72]
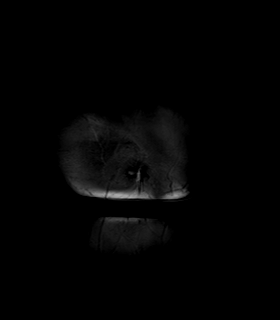

[41 of 48 positions shown; findings below may reference images not displayed]

FINDINGS: Lower chest: No acute abnormality at the lung bases.

Hepatobiliary: Normal liver size and configuration. No hepatic
steatosis. No liver mass. Normal gallbladder with no cholelithiasis.
No intrahepatic biliary ductal dilatation. Dilated common bile duct
(11 mm diameter proximally) with distal tapering, unchanged since
02/11/2016 MRI. No discrete biliary filling defects or masses.

Pancreas: No pancreatic mass. Generalized mild pancreatic duct
dilation up to 4 mm diameter in the pancreatic head, unchanged since
02/11/2016 MRI. No pancreas divisum.

Spleen: Normal size. No mass.

Adrenals/Urinary Tract: Normal adrenals. No hydronephrosis. Normal
kidneys with no renal mass.

Stomach/Bowel: Normal non-distended stomach. Visualized small and
large bowel is normal caliber, with no bowel wall thickening.

Vascular/Lymphatic: Normal caliber abdominal aorta. No
pathologically enlarged lymph nodes in the abdomen.

Other: No abdominal ascites or focal fluid collection.

Musculoskeletal: No aggressive appearing focal osseous lesions.
IMPRESSION: 1. No acute abnormality. No cholelithiasis.
2. Chronic dilatation of the common bile duct (11 mm diameter) with
distal tapering, unchanged since 02/11/2016 MRI. No significant
intrahepatic biliary ductal dilatation. No biliary filling defects
or masses.
3. Generalized mild pancreatic duct dilation, unchanged since
02/11/2016 MRI. No pancreatic mass. No pancreas divisum.

## 2022-01-21 ENCOUNTER — Telehealth: Payer: Self-pay | Admitting: Cardiovascular Disease

## 2022-01-21 NOTE — Telephone Encounter (Signed)
Spoke with patient.  ? ?Patient reports that her heart rate has not been over 50 in weeks. States that BP has been good during the day, but then goes up in the evening for which she takes hydralazine, but she did not offer any specific numbers.  ? ?States that she'll have periods of dizziness after she eats, but that usually goes away after she sits down and rests, however today she's been dizzy for the majority of the day. States that today she's also been having SOB with exertion.  ? ?Wants to know what she should do because she's "tired of feeling like this" and has to fly out of state next month for a wedding shower.  ? ?Advised that I will forward this to MD for review of anything that could be done, but advised that she likely needed to be seen for OV.  ? ?Appt made for April 26th with Christell Faith, and pt added to wait list. Encouraged patient to seek care in ER should symptoms worsen or not improve. Also encouraged her to stay well hydrated and change positions slowly.  ? ?Pt verbalized understanding of everything discussed and voiced appreciation for the call.  ?

## 2022-01-21 NOTE — Telephone Encounter (Signed)
STAT if HR is under 50 or over 120 ?(normal HR is 60-100 beats per minute) ? ?What is your heart rate? 45 ? ?Do you have a log of your heart rate readings (document readings)? no ? ?Do you have any other symptoms? Provider has been aware that it's low.  She wants to know what he would like for her to do.  She states she feels tired, and kind of SOB, she also has dizziness.    ?

## 2022-01-22 ENCOUNTER — Encounter: Payer: Self-pay | Admitting: Cardiovascular Disease

## 2022-01-22 ENCOUNTER — Ambulatory Visit: Payer: Medicare Other | Admitting: Cardiovascular Disease

## 2022-01-22 VITALS — BP 144/82 | HR 56 | Ht 62.0 in | Wt 128.0 lb

## 2022-01-22 DIAGNOSIS — R0609 Other forms of dyspnea: Secondary | ICD-10-CM | POA: Diagnosis not present

## 2022-01-22 DIAGNOSIS — M7989 Other specified soft tissue disorders: Secondary | ICD-10-CM

## 2022-01-22 DIAGNOSIS — I471 Supraventricular tachycardia: Secondary | ICD-10-CM

## 2022-01-22 DIAGNOSIS — I10 Essential (primary) hypertension: Secondary | ICD-10-CM

## 2022-01-22 DIAGNOSIS — R0989 Other specified symptoms and signs involving the circulatory and respiratory systems: Secondary | ICD-10-CM

## 2022-01-22 DIAGNOSIS — R531 Weakness: Secondary | ICD-10-CM

## 2022-01-22 DIAGNOSIS — R0602 Shortness of breath: Secondary | ICD-10-CM

## 2022-01-22 DIAGNOSIS — I6529 Occlusion and stenosis of unspecified carotid artery: Secondary | ICD-10-CM

## 2022-01-22 DIAGNOSIS — E782 Mixed hyperlipidemia: Secondary | ICD-10-CM

## 2022-01-22 DIAGNOSIS — I739 Peripheral vascular disease, unspecified: Secondary | ICD-10-CM

## 2022-01-22 MED ORDER — DOXAZOSIN MESYLATE 1 MG PO TABS: 1.0000 mg | ORAL_TABLET | Freq: Two times a day (BID) | ORAL | 2 refills | Status: DC

## 2022-01-22 NOTE — Patient Instructions (Addendum)
Medication Instructions:  ?- Your physician has recommended you make the following change in your medication:  ? ?1) DECREASE cardura/doxazosin down to 1 mg: ?- take 1 tablet by mouth twice day ? ?If you need a refill on your cardiac medications before your next appointment, please call your pharmacy.  ? ?Lab work: ?No new labs needed ? ?Testing/Procedures: ?No new testing needed ? ?Follow-Up: ?At [facility], you and your health needs are our priority.  As part of our continuing mission to provide you with exceptional heart care, we have created designated Provider Care Teams.  These Care Teams include your primary Cardiologist (physician) and Advanced Practice Providers (APPs -  Physician Assistants and Nurse Practitioners) who all work together to provide you with the care you need, when you need it. ? ?You will need a follow up appointment in 6 months ? ?Providers on your designated Care Team:   ?Murray Hodgkins, NP ?Christell Faith, PA-C ?Cadence Kathlen Mody, PA-C ? ?COVID-19 Vaccine Information can be found at: ShippingScam.co.uk For questions related to vaccine distribution or appointments, please email [email or web address] or call [phone].  ? ?

## 2022-01-22 NOTE — Progress Notes (Signed)
Cardiology Office Note ? ?Date:  01/22/2022  ? ?ID:  Lindsey [name], DOB 1935-12-04, MRN [id number] ? ?PCP:  Tracie Harrier, MD  ? ?Chief Complaint  ?Patient presents with  ? OTher  ?  Patient c.o brady, dizziness, and SOB. Meds reviewed verbally with patient.   ? ? ?HPI:  ?Lindsey [name] is a pleasant 86 year old woman with history of  ?lower extremity swelling,Secondary to venous insufficiency  ?prior right knee surgery,  ?right DVT, status post IVC filter,  ?bronchiectasis on inhalers and nebulizers at home  ?Back surgery ?who presents for follow up of her labile blood pressure and her leg swelling. ? ?Last seen in clinic by myself November 28, 2021 ?On bisoprolol 5 mg for tachycardia, ? ?In follow-up today she reports having Months of Dizzy spells that present after eating dinner when standing ?Etiology unclear but concerning for drop in blood pressure ? ?Otherwise she reports BP elevated in the evening, typically has to take extra hydralazine several nights per week ?Lives at [place], has dinner at their cafeteria ?Unable to walk up the hill has to drive, legs are getting weaker ?Does aerobics but no regular walking program ? ?Reports having recent flareup of her pulmonary disease, has been treated with ABX and cortisone for bronchiectasis, almost done with her medication ? ?Denies significant leg swelling, takes Lasix daily ?Does not think that she can do well without the Lasix ?HCTZ held in January 2023, started on Lasix ? ?Other past medical history reviewed ?[facility] ED on 10/15/21 for evaluation of dizziness.  ?weakness and disorientation, her legs felt very heavy, and she was not sure if she could take the next step.  ?There was no evidence of arrhythmia or ischemia on cardiac monitor with the exception of frequent PVCs. Orthostatic VS were unremarkable. Hs troponins were within normal range ? ?Echocardiogram November 24, 2021 Reviewed on today's visit ?Normal LV function ?Normal RV function ?No  significant valve disease ? ?EKG personally reviewed by myself on todays visit ?Sinus bradycardia rate 56 bpm PACs right bundle branch block ? ?Zio monitor reviewed ?Patient had a min HR of 40 bpm, max HR of 143 bpm, and avg HR of 55 bpm. Predominant underlying rhythm was Sinus Rhythm. ?5 Supraventricular Tachycardia runs occurred, the run with  the fastest interval lasting 10 beats with a max rate of 143 bpm (avg 136 bpm); the run with the fastest interval was also the longest.  ?Supraventricular Tachycardia / Atrial Tachycardia ?Isolated SVEs  were frequent (12.9%, [id number]), SVE Couplets were rare (<1.0%, 1059), and SVE Triplets were rare (<1.0%, 17).  ?Isolated VEs were rare (<1.0%), and no VE Couplets or VE Triplets were present. ? ?Off telmistartan, stopped , "Causes dizziness" ? ?Other past medical history reviewed ?has carotid disease done through [facility], was told she had moderate disease ? ?Does not want a statin ? ?visit to the emergency room ?ER 6/28,2018 ?Reported having dizziness and shortness of breath. Presented relatively acutely  ?Was drinking extra wine ,  stood up she felt dizzy/lightheaded and "breathing heavily".  ?Symptoms lasted approximately 2 hours  resolved.  ?Workup in the emergency room was benign ?Lab work as detailed below discussed with her ?Sodium 134, potassium 3.2, BUN 21 ?She does not take potassium supplement on a daily basis ? ?Leg swelling History dates back many years but started with right knee surgery after an accident, fracture. She suffered a DVT, had IVC filter placed. ?Swelling worse at the end of the day, better in the morning.  She does have significant varicosities. ? ? bronchiectasis diagnosed at outside facility. ? ?stress test may 2015 which was reportedly normal ? ?PMH:   has a past medical history of Anemia, Atrial tachycardia ([place]), Breast cancer ([place]), Bronchiectasis ([place]) (06/2007), Chronic Dizziness, Colitis, COPD (chronic obstructive pulmonary disease) ([place]),  Depression, Diastolic dysfunction, DVT (deep venous thrombosis) ([place]), Fibromyalgia, Labile Hypertension, Migraine headache with aura, Osteoarthritis, Osteoporosis, PAD (peripheral artery disease) ([place]), Pneumonia, Polio (1952), and Stenosis of carotid artery. ? ?PSH:    ?Past Surgical History:  ?Procedure Laterality Date  ? ABDOMINAL HYSTERECTOMY    ? BACK SURGERY    ? BILATERAL TOTAL MASTECTOMY WITH AXILLARY LYMPH NODE DISSECTION    ? BREAST SURGERY    ? CARPAL TUNNEL RELEASE    ? bilateral   ? COLONOSCOPY WITH PROPOFOL N/A 08/02/2015  ? Procedure: COLONOSCOPY WITH PROPOFOL;  Surgeon: Manya Silvas, MD;  Location: [facility] ENDOSCOPY;  Service: Endoscopy;  Laterality: N/A;  ? FINGER TENDON REPAIR    ? HERNIA REPAIR    ? REPLACEMENT TOTAL KNEE    ? right knee   ? SQUAMOUS CELL CARCINOMA EXCISION    ? TONSILLECTOMY    ? TOTAL KNEE ARTHROPLASTY Left 09/19/2020  ? Procedure: TOTAL KNEE ARTHROPLASTY;  Surgeon: Hessie Knows, MD;  Location: [facility] ORS;  Service: Orthopedics;  Laterality: Left;  ? UMBILICAL HERNIA REPAIR    ? VAGINAL HYSTERECTOMY    ? ? ?Current Outpatient Medications on File Prior to Visit  ?Medication Sig Dispense Refill  ? ARIPiprazole (ABILIFY) 2 MG tablet Take 2 mg by mouth daily.     ? Aspirin 81 MG CAPS Take 81 mg by mouth daily.    ? bisoprolol (ZEBETA) 5 MG tablet Take 1 tablet (5 mg total) by mouth daily. 90 tablet 3  ? cyclobenzaprine (FLEXERIL) 5 MG tablet Take 5 mg by mouth at bedtime as needed.    ? doxazosin (CARDURA) 2 MG tablet TAKE 1 TABLET BY MOUTH TWICE DAILY 180 tablet 2  ? furosemide (LASIX) 20 MG tablet Take 1 tablet (20 mg total) by mouth daily. 90 tablet 2  ? Galcanezumab-gnlm (EMGALITY) 120 MG/ML SOAJ Inject 120 mg into the skin every 30 (thirty) days.    ? hydrALAZINE (APRESOLINE) 25 MG tablet Take 1 tablet (25 mg total) by mouth 3 (three) times daily as needed (for BP >150). 90 tablet 2  ? hydrOXYzine (ATARAX/VISTARIL) 25 MG tablet Take 25 mg by mouth as needed for anxiety.    ?  NON FORMULARY Take 2 capsules by mouth in the morning and at bedtime. Algaecal    ? NON FORMULARY Take 2 capsules by mouth daily. Strontium Boost    ? polyethylene glycol (MIRALAX / GLYCOLAX) packet Take 17 g by mouth daily as needed for mild constipation or moderate constipation.     ? potassium chloride (KLOR-CON M) 10 MEQ tablet TAKE 1 TABLET BY MOUTH ONCE DAILY 90 tablet 3  ? rizatriptan (MAXALT) 5 MG tablet Take 5 mg by mouth as needed for migraine.     ? SYMBICORT 160-4.5 MCG/ACT inhaler SMARTSIG:2 Inhalation Via Inhaler Twice Daily    ? venlafaxine XR (EFFEXOR-XR) 75 MG 24 hr capsule Take 75 mg by mouth daily with breakfast.    ? ?No current facility-administered medications on file prior to visit.  ? ? ?Allergies:   Ivp dye [iodinated contrast media], Codeine, Demeclocycline, Sulfa antibiotics, Tegaderm ag mesh [silver], Telmisartan, and Tetracyclines & related  ? ?Social History:  The patient  reports that  she has never smoked. She has never used smokeless tobacco. She reports current alcohol use of about 1.0 standard drink per week. She reports that she does not use drugs.  ? ?Family History:   family history includes Atrial fibrillation in her sister.  ? ? ?Review of Systems: ?Review of Systems  ?Constitutional: Negative.   ?HENT: Negative.    ?Respiratory: Negative.    ?Cardiovascular: Negative.   ?Gastrointestinal: Negative.   ?Musculoskeletal:   ?     Leg weakness  ?Neurological:  Positive for dizziness.  ?Psychiatric/Behavioral: Negative.    ?All other systems reviewed and are negative. ? ?PHYSICAL EXAM: ?VS:  BP (!) 144/82 (BP Location: Left Arm, Patient Position: Sitting, Cuff Size: Normal)   Pulse (!) 56   Ht '5\' 2"'$  (1.575 m)   Wt 128 lb (58.1 kg)   SpO2 98%   BMI 23.41 kg/m?  , BMI Body mass index is 23.41 kg/m?[place] Kitchen  ?Constitutional:  oriented to person, place, and time. No distress.  ?HENT:  ?Head: Grossly normal ?Eyes:  no discharge. No scleral icterus.  ?Neck: No JVD, no carotid bruits   ?Cardiovascular: Regular rate and rhythm, no murmurs appreciated ?Pulmonary/Chest: Mildly decreased lung sounds, scattered Rales ?Abdominal: Soft.  no distension.  no tenderness.  ?Musculoskeletal: Normal range of mot

## 2022-01-23 ENCOUNTER — Telehealth: Payer: Self-pay | Admitting: *Deleted

## 2022-01-23 NOTE — Addendum Note (Signed)
Addended by: Janan Ridge on: 01/23/2022 04:08 PM ? ? Modules accepted: Orders ? ?

## 2022-01-23 NOTE — Telephone Encounter (Signed)
-----   Message from Leeroy Bock, [place] sent at 01/23/2022 11:16 AM EDT ----- ?No contraindications for her to take Vitamin E with her other drugs. Higher doses can cause bleeding as a side effect though and some of her other meds already contribute to that (aspirin, venlafaxine). She should be on the lookout for any bruising/bleeding out of the ordinary after starting Vitamin E and would recommend stopping if she notices an increase. ? ?Thanks, ?Megan ?----- Message ----- ?From: Emily Filbert, RN ?Sent: 01/22/2022   3:50 PM EDT ?To: Emily Filbert, RN, [facility] Pharmd ? ?Patient in office today and advised that she has started back on Vitamin E- she was curious as to any contraindications for taking this with her current meds.  ? ?Thanks! ? ? ?

## 2022-01-23 NOTE — Telephone Encounter (Signed)
Attempted to call the patient with [name] response in regards to Vitamin E supplementation.  ? ?No answer- I left a detailed message of Lindsey [name]'s recommendations on the patient's identified voice mail. ? ?I asked that she call back with any further questions/ concerns. ? ? ?

## 2022-01-28 ENCOUNTER — Ambulatory Visit: Payer: Medicare Other | Admitting: Physician Assistant

## 2022-02-12 ENCOUNTER — Telehealth: Payer: Self-pay | Admitting: Cardiovascular Disease

## 2022-02-12 NOTE — Telephone Encounter (Signed)
Called and spoke with pt.  ?Pt reports swelling in both feet and ankles that has been gradual over that last 2-3 days.  ?Denies incr in weight.  ?Denies incr in shortness of breath. Pt states "I am always short of breath, have been for months, this is my usual and there have been no changes." ? ?Pt confirms she is still taking Lasix 20 mg daily.  ?Last ov with Dr. Rockey Situ 01/22/22. No reported swelling at ov, pt to continue Lasix 20 mg daily per note.  ?Pt was incr from Lasix 20 mg M,W,F at last ov on 11/28/21.  ? ?Pt's main question is that she is traveling today at 4 PM, flying to [facility] and asks if she needs to incr Lasix d/t swelling and future travel plans.  ? ?Advised pt keep legs elevated when able.  ?Pt states she is not able to wear compression socks.  ? ?Notified pt I will send message to Dr. Rockey Situ and call back with further recc.  ?

## 2022-02-12 NOTE — Telephone Encounter (Signed)
Patient following up from call this morning. The patient is going to be flying to [facility] today at 4:00. She will try to call back tomorrow  ?

## 2022-02-12 NOTE — Telephone Encounter (Signed)
Pt c/o swelling: STAT is pt has developed SOB within 24 hours ? ?If swelling, where is the swelling located? Feet and ankles ? ?How much weight have you gained and in what time span? No change in weight ? ?Have you gained 3 pounds in a day or 5 pounds in a week?  ? ?Do you have a log of your daily weights (if so, list)?  ? ?Are you currently taking a fluid pill? yes ? ?Are you currently SOB? yes ? ?Have you traveled recently? No. The patient is leaving this afternoon though ? ?Pt c/o Shortness Of Breath: STAT if SOB developed within the last 24 hours or pt is noticeably SOB on the phone ? ?1. Are you currently SOB (can you hear that pt is SOB on the phone)?  ? ?2. How long have you been experiencing SOB? A long time ? ?3. Are you SOB when sitting or when up moving around? Up and moving around ? ?4. Are you currently experiencing any other symptoms?   No ? ? ?Patient said she was told to reduce her lasix but the dosage of the potassium was not reduced.  ?

## 2022-02-16 NOTE — Telephone Encounter (Signed)
Lindsey Merritts, MD   ? ?leg swelling could come from several different things  ?Including sitting more with legs down, that would be treated with compression hose and more ambulation, leg elevation  ?Could also come from fluid overload if there has been high salt or fluid intake.  Give Lasix daily not taking care of fluid overload, may need 2 to 3 days of Lasix twice a day morning and after lunch.  ?With lots of travel to [facility] etc., would expect worsening leg swelling from sitting in a plane, for that would treat with compression hose, leg elevation, movement  ?Thx  ?TG   ? ?Called patient to go over recommendations. Pt verbalized understanding and voiced appreciation for the call.  ?

## 2022-03-01 ENCOUNTER — Inpatient Hospital Stay
Admission: EM | Admit: 2022-03-01 | Discharge: 2022-03-04 | DRG: 280 | Disposition: A | Discharge status: Home / Self Care | Payer: Medicare Other | Source: Ambulatory Visit | Attending: Student | Admitting: Student

## 2022-03-01 ENCOUNTER — Other Ambulatory Visit: Payer: Self-pay

## 2022-03-01 ENCOUNTER — Encounter: Payer: Self-pay | Admitting: Intensive Care

## 2022-03-01 ENCOUNTER — Emergency Department: Payer: Medicare Other

## 2022-03-01 DIAGNOSIS — M797 Fibromyalgia: Secondary | ICD-10-CM | POA: Diagnosis present

## 2022-03-01 DIAGNOSIS — R7989 Other specified abnormal findings of blood chemistry: Secondary | ICD-10-CM

## 2022-03-01 DIAGNOSIS — I5033 Acute on chronic diastolic (congestive) heart failure: Secondary | ICD-10-CM | POA: Diagnosis not present

## 2022-03-01 DIAGNOSIS — I214 Non-ST elevation (NSTEMI) myocardial infarction: Secondary | ICD-10-CM | POA: Diagnosis present

## 2022-03-01 DIAGNOSIS — Z885 Allergy status to narcotic agent status: Secondary | ICD-10-CM

## 2022-03-01 DIAGNOSIS — M1990 Unspecified osteoarthritis, unspecified site: Secondary | ICD-10-CM | POA: Diagnosis present

## 2022-03-01 DIAGNOSIS — Z87892 Personal history of anaphylaxis: Secondary | ICD-10-CM | POA: Diagnosis not present

## 2022-03-01 DIAGNOSIS — H9190 Unspecified hearing loss, unspecified ear: Secondary | ICD-10-CM | POA: Diagnosis present

## 2022-03-01 DIAGNOSIS — Z20822 Contact with and (suspected) exposure to covid-19: Secondary | ICD-10-CM | POA: Diagnosis present

## 2022-03-01 DIAGNOSIS — I110 Hypertensive heart disease with heart failure: Secondary | ICD-10-CM | POA: Diagnosis present

## 2022-03-01 DIAGNOSIS — Z8701 Personal history of pneumonia (recurrent): Secondary | ICD-10-CM | POA: Diagnosis not present

## 2022-03-01 DIAGNOSIS — J479 Bronchiectasis, uncomplicated: Secondary | ICD-10-CM | POA: Diagnosis present

## 2022-03-01 DIAGNOSIS — R0602 Shortness of breath: Secondary | ICD-10-CM

## 2022-03-01 DIAGNOSIS — R0902 Hypoxemia: Secondary | ICD-10-CM

## 2022-03-01 DIAGNOSIS — I739 Peripheral vascular disease, unspecified: Secondary | ICD-10-CM | POA: Diagnosis present

## 2022-03-01 DIAGNOSIS — Z79899 Other long term (current) drug therapy: Secondary | ICD-10-CM

## 2022-03-01 DIAGNOSIS — H3530 Unspecified macular degeneration: Secondary | ICD-10-CM | POA: Diagnosis present

## 2022-03-01 DIAGNOSIS — M810 Age-related osteoporosis without current pathological fracture: Secondary | ICD-10-CM | POA: Diagnosis present

## 2022-03-01 DIAGNOSIS — Z96653 Presence of artificial knee joint, bilateral: Secondary | ICD-10-CM | POA: Diagnosis present

## 2022-03-01 DIAGNOSIS — Z86718 Personal history of other venous thrombosis and embolism: Secondary | ICD-10-CM

## 2022-03-01 DIAGNOSIS — Z853 Personal history of malignant neoplasm of breast: Secondary | ICD-10-CM | POA: Diagnosis not present

## 2022-03-01 DIAGNOSIS — I219 Acute myocardial infarction, unspecified: Secondary | ICD-10-CM | POA: Diagnosis not present

## 2022-03-01 DIAGNOSIS — Z90710 Acquired absence of both cervix and uterus: Secondary | ICD-10-CM

## 2022-03-01 DIAGNOSIS — Z882 Allergy status to sulfonamides status: Secondary | ICD-10-CM

## 2022-03-01 DIAGNOSIS — Z881 Allergy status to other antibiotic agents status: Secondary | ICD-10-CM

## 2022-03-01 DIAGNOSIS — I5023 Acute on chronic systolic (congestive) heart failure: Secondary | ICD-10-CM

## 2022-03-01 DIAGNOSIS — Z8249 Family history of ischemic heart disease and other diseases of the circulatory system: Secondary | ICD-10-CM

## 2022-03-01 DIAGNOSIS — R609 Edema, unspecified: Principal | ICD-10-CM

## 2022-03-01 DIAGNOSIS — R197 Diarrhea, unspecified: Secondary | ICD-10-CM

## 2022-03-01 DIAGNOSIS — I5021 Acute systolic (congestive) heart failure: Secondary | ICD-10-CM | POA: Diagnosis not present

## 2022-03-01 DIAGNOSIS — E876 Hypokalemia: Secondary | ICD-10-CM | POA: Diagnosis present

## 2022-03-01 DIAGNOSIS — I5043 Acute on chronic combined systolic (congestive) and diastolic (congestive) heart failure: Secondary | ICD-10-CM | POA: Diagnosis present

## 2022-03-01 DIAGNOSIS — R778 Other specified abnormalities of plasma proteins: Secondary | ICD-10-CM

## 2022-03-01 DIAGNOSIS — Z8612 Personal history of poliomyelitis: Secondary | ICD-10-CM | POA: Diagnosis not present

## 2022-03-01 DIAGNOSIS — F32A Depression, unspecified: Secondary | ICD-10-CM | POA: Diagnosis present

## 2022-03-01 DIAGNOSIS — I10 Essential (primary) hypertension: Secondary | ICD-10-CM | POA: Diagnosis not present

## 2022-03-01 DIAGNOSIS — Z91018 Allergy to other foods: Secondary | ICD-10-CM

## 2022-03-01 DIAGNOSIS — Z91041 Radiographic dye allergy status: Secondary | ICD-10-CM

## 2022-03-01 DIAGNOSIS — I2510 Atherosclerotic heart disease of native coronary artery without angina pectoris: Secondary | ICD-10-CM | POA: Diagnosis present

## 2022-03-01 DIAGNOSIS — Z8261 Family history of arthritis: Secondary | ICD-10-CM

## 2022-03-01 DIAGNOSIS — Z9013 Acquired absence of bilateral breasts and nipples: Secondary | ICD-10-CM

## 2022-03-01 DIAGNOSIS — Z7982 Long term (current) use of aspirin: Secondary | ICD-10-CM

## 2022-03-01 DIAGNOSIS — Z888 Allergy status to other drugs, medicaments and biological substances status: Secondary | ICD-10-CM

## 2022-03-01 DIAGNOSIS — F419 Anxiety disorder, unspecified: Secondary | ICD-10-CM | POA: Diagnosis present

## 2022-03-01 LAB — CBC WITH DIFFERENTIAL/PLATELET
Abs Immature Granulocytes: 0.02 10*3/uL (ref 0.00–0.07)
Basophils Absolute: 0 10*3/uL (ref 0.0–0.1)
Basophils Relative: 1 %
Eosinophils Absolute: 0.2 10*3/uL (ref 0.0–0.5)
Eosinophils Relative: 4 %
HCT: 36.8 % (ref 36.0–46.0)
Hemoglobin: 11.6 g/dL — ABNORMAL LOW (ref 12.0–15.0)
Immature Granulocytes: 0 %
Lymphocytes Relative: 20 %
Lymphs Abs: 1.1 10*3/uL (ref 0.7–4.0)
MCH: 28.4 pg (ref 26.0–34.0)
MCHC: 31.5 g/dL (ref 30.0–36.0)
MCV: 90.2 fL (ref 80.0–100.0)
Monocytes Absolute: 0.6 10*3/uL (ref 0.1–1.0)
Monocytes Relative: 10 %
Neutro Abs: 3.6 10*3/uL (ref 1.7–7.7)
Neutrophils Relative %: 65 %
Platelets: 246 10*3/uL (ref 150–400)
RBC: 4.08 MIL/uL (ref 3.87–5.11)
RDW: 14.6 % (ref 11.5–15.5)
WBC: 5.6 10*3/uL (ref 4.0–10.5)
nRBC: 0 % (ref 0.0–0.2)

## 2022-03-01 LAB — BLOOD GAS, VENOUS
Acid-Base Excess: 3.4 mmol/L — ABNORMAL HIGH (ref 0.0–2.0)
Bicarbonate: 29.6 mmol/L — ABNORMAL HIGH (ref 20.0–28.0)
O2 Saturation: 67.4 %
Patient temperature: 37
pCO2, Ven: 50 mmHg (ref 44–60)
pH, Ven: 7.38 (ref 7.25–7.43)
pO2, Ven: 42 mmHg (ref 32–45)

## 2022-03-01 LAB — LACTIC ACID, PLASMA
Lactic Acid, Venous: 1 mmol/L (ref 0.5–1.9)
Lactic Acid, Venous: 1.1 mmol/L (ref 0.5–1.9)

## 2022-03-01 LAB — COMPREHENSIVE METABOLIC PANEL
ALT: 101 U/L — ABNORMAL HIGH (ref 0–44)
AST: 99 U/L — ABNORMAL HIGH (ref 15–41)
Albumin: 3.6 g/dL (ref 3.5–5.0)
Alkaline Phosphatase: 86 U/L (ref 38–126)
Anion gap: 6 (ref 5–15)
BUN: 16 mg/dL (ref 8–23)
CO2: 25 mmol/L (ref 22–32)
Calcium: 8.5 mg/dL — ABNORMAL LOW (ref 8.9–10.3)
Chloride: 105 mmol/L (ref 98–111)
Creatinine, Ser: 0.55 mg/dL (ref 0.44–1.00)
GFR, Estimated: >60 mL/min (ref >60)
Glucose, Bld: 82 mg/dL (ref 70–99)
Potassium: 3.6 mmol/L (ref 3.5–5.1)
Sodium: 136 mmol/L (ref 135–145)
Total Bilirubin: 0.7 mg/dL (ref 0.3–1.2)
Total Protein: 6 g/dL — ABNORMAL LOW (ref 6.5–8.1)

## 2022-03-01 LAB — RESP PANEL BY RT-PCR (FLU A&B, COVID) ARPGX2
Influenza A by PCR: NEGATIVE
Influenza B by PCR: NEGATIVE
SARS Coronavirus 2 by RT PCR: NEGATIVE

## 2022-03-01 LAB — APTT: aPTT: 30 seconds (ref 24–36)

## 2022-03-01 LAB — TROPONIN I (HIGH SENSITIVITY)
Troponin I (High Sensitivity): 1009 ng/L (ref <18)
Troponin I (High Sensitivity): 1044 ng/L (ref <18)

## 2022-03-01 LAB — BRAIN NATRIURETIC PEPTIDE: B Natriuretic Peptide: 1740.8 pg/mL — ABNORMAL HIGH (ref 0.0–100.0)

## 2022-03-01 MED ORDER — ONDANSETRON HCL 4 MG/2ML IJ SOLN
4.0000 mg | Freq: Four times a day (QID) | INTRAMUSCULAR | Status: DC | PRN
Start: 2022-03-01 — End: 2022-03-04

## 2022-03-01 MED ORDER — ARIPIPRAZOLE 2 MG PO TABS
2.0000 mg | ORAL_TABLET | Freq: Every day | ORAL | Status: DC
  Administered 2022-03-02 – 2022-03-04 (×3): 2 mg via ORAL
  Filled 2022-03-01 (×3): qty 1

## 2022-03-01 MED ORDER — BISOPROLOL FUMARATE 5 MG PO TABS
5.0000 mg | ORAL_TABLET | Freq: Every day | ORAL | Status: DC
  Administered 2022-03-01 – 2022-03-04 (×4): 5 mg via ORAL
  Filled 2022-03-01 (×4): qty 1

## 2022-03-01 MED ORDER — ACETAMINOPHEN 325 MG PO TABS
650.0000 mg | ORAL_TABLET | Freq: Four times a day (QID) | ORAL | Status: DC | PRN
  Administered 2022-03-03 – 2022-03-04 (×2): 650 mg via ORAL
  Filled 2022-03-01: qty 2

## 2022-03-01 MED ORDER — ASPIRIN 81 MG PO CHEW
324.0000 mg | CHEWABLE_TABLET | Freq: Once | ORAL | Status: AC
Start: 2022-03-01 — End: 2022-03-01
  Administered 2022-03-01: 324 mg via ORAL
  Filled 2022-03-01: qty 4

## 2022-03-01 MED ORDER — VENLAFAXINE HCL ER 75 MG PO CP24
225.0000 mg | ORAL_CAPSULE | Freq: Every day | ORAL | Status: DC
  Administered 2022-03-02 – 2022-03-04 (×2): 225 mg via ORAL
  Filled 2022-03-01: qty 3
  Filled 2022-03-01: qty 1

## 2022-03-01 MED ORDER — HYDRALAZINE HCL 10 MG PO TABS
10.0000 mg | ORAL_TABLET | Freq: Three times a day (TID) | ORAL | Status: DC
  Administered 2022-03-01 – 2022-03-04 (×7): 10 mg via ORAL
  Filled 2022-03-01 (×9): qty 1

## 2022-03-01 MED ORDER — POTASSIUM CHLORIDE CRYS ER 20 MEQ PO TBCR
10.0000 meq | EXTENDED_RELEASE_TABLET | Freq: Every day | ORAL | Status: DC
  Administered 2022-03-01: 10 meq via ORAL
  Filled 2022-03-01: qty 1

## 2022-03-01 MED ORDER — FUROSEMIDE 10 MG/ML IJ SOLN
20.0000 mg | Freq: Once | INTRAMUSCULAR | Status: AC
  Administered 2022-03-01: 20 mg via INTRAVENOUS
  Filled 2022-03-01: qty 4

## 2022-03-01 MED ORDER — CYCLOBENZAPRINE HCL 10 MG PO TABS: 5.0000 mg | ORAL_TABLET | Freq: Every evening | ORAL | Status: DC | PRN

## 2022-03-01 MED ORDER — POLYETHYLENE GLYCOL 3350 17 G PO PACK: 17.0000 g | PACK | Freq: Every day | ORAL | Status: DC | PRN

## 2022-03-01 MED ORDER — HYDROXYZINE HCL 25 MG PO TABS: 25.0000 mg | ORAL_TABLET | Freq: Three times a day (TID) | ORAL | Status: DC | PRN

## 2022-03-01 MED ORDER — ATORVASTATIN CALCIUM 20 MG PO TABS
40.0000 mg | ORAL_TABLET | Freq: Every day | ORAL | Status: DC
  Administered 2022-03-02: 40 mg via ORAL
  Filled 2022-03-01 (×2): qty 2

## 2022-03-01 MED ORDER — SODIUM CHLORIDE 3 % IN NEBU: 5.0000 mL | INHALATION_SOLUTION | RESPIRATORY_TRACT | Status: DC | PRN

## 2022-03-01 MED ORDER — NITROGLYCERIN 0.4 MG SL SUBL: 0.4000 mg | SUBLINGUAL_TABLET | SUBLINGUAL | Status: DC | PRN

## 2022-03-01 MED ORDER — HEPARIN BOLUS VIA INFUSION
3600.0000 [IU] | Freq: Once | INTRAVENOUS | Status: AC
  Administered 2022-03-01: 3600 [IU] via INTRAVENOUS
  Filled 2022-03-01: qty 3600

## 2022-03-01 MED ORDER — FUROSEMIDE 10 MG/ML IJ SOLN
40.0000 mg | Freq: Two times a day (BID) | INTRAMUSCULAR | Status: DC
  Administered 2022-03-02 – 2022-03-03 (×3): 40 mg via INTRAVENOUS
  Filled 2022-03-01 (×3): qty 4

## 2022-03-01 MED ORDER — NITROGLYCERIN 2 % TD OINT
0.5000 [in_us] | TOPICAL_OINTMENT | Freq: Four times a day (QID) | TRANSDERMAL | Status: DC
  Administered 2022-03-01 – 2022-03-03 (×6): 0.5 [in_us] via TOPICAL
  Filled 2022-03-01 (×6): qty 1

## 2022-03-01 MED ORDER — MOMETASONE FURO-FORMOTEROL FUM 200-5 MCG/ACT IN AERO
2.0000 | INHALATION_SPRAY | Freq: Two times a day (BID) | RESPIRATORY_TRACT | Status: DC
  Administered 2022-03-02 – 2022-03-04 (×4): 2 via RESPIRATORY_TRACT
  Filled 2022-03-01: qty 8.8

## 2022-03-01 MED ORDER — HEPARIN (PORCINE) 25000 UT/250ML-% IV SOLN
900.0000 [IU]/h | INTRAVENOUS | Status: DC
  Administered 2022-03-01: 700 [IU]/h via INTRAVENOUS
  Administered 2022-03-02: 900 [IU]/h via INTRAVENOUS
  Filled 2022-03-01 (×2): qty 250

## 2022-03-01 MED ORDER — DOXAZOSIN MESYLATE 1 MG PO TABS
1.0000 mg | ORAL_TABLET | Freq: Two times a day (BID) | ORAL | Status: DC
  Filled 2022-03-01: qty 1

## 2022-03-01 MED ORDER — ACETAMINOPHEN 650 MG RE SUPP: 650.0000 mg | Freq: Four times a day (QID) | RECTAL | Status: DC | PRN

## 2022-03-01 MED ORDER — ASPIRIN 81 MG PO TBEC
81.0000 mg | DELAYED_RELEASE_TABLET | Freq: Every day | ORAL | Status: DC
  Administered 2022-03-02 – 2022-03-04 (×3): 81 mg via ORAL
  Filled 2022-03-01 (×2): qty 1

## 2022-03-01 NOTE — ED Notes (Signed)
RN to bedside as family was flagging down staff. They were placed in room and wasn't informed of what the next process was. Pt was triaged but no labs collected in triage. This RN will initiate treatment.

## 2022-03-01 NOTE — Assessment & Plan Note (Addendum)
Send off stool studies if has further bowel movement.

## 2022-03-01 NOTE — ED Notes (Signed)
Patient transported to X-ray 

## 2022-03-01 NOTE — Assessment & Plan Note (Signed)
Stable.  Continue Abilify and Effexor

## 2022-03-01 NOTE — H&P (Signed)
History and Physical    Patient: Lindsey [name]:[id number] DOB: March 06, 1936 DOA: 03/01/2022 DOS: the patient was seen and examined on 03/01/2022 PCP: Tracie Harrier, MD  Patient coming from: ALF/ILF  Chief Complaint:  Chief Complaint  Patient presents with   Shortness of Breath   Leg Swelling   HPI: Lindsey [name] is a 86 y.o. female with medical history significant of bronchiectasis, arthritis, hypertension, migraines, polio, hearing loss, macular degeneration presents to the hospital with shortness of breath going on for few days.  She states that she is been put on Lasix recently for swelling of her lower extremities.  She has been slowly gaining weight around 15 pounds.  She had a low pulse ox today in the 80s.  She has been retaining fluid over the lower extremities.  She has been having diarrhea for the last 2 and half weeks.  She is also having cough and wheeze.  Of note she states when she was in [place] 3 weeks ago did have chest pain lasting half hour with nausea shortness of breath.  She has had occasional chest pain on and off waking her up from sleep over the past few weeks.  Chest pain not lasting long but in the center of the chest feeling like a pressure.  In the ER, patient's BNP was elevated at 1740 and troponin elevated at 1009.  Liver function test elevated.  Chest x-ray consistent with CHF.  Hospitalist services contacted for further evaluation. Review of Systems: Review of Systems  Constitutional:  Positive for malaise/fatigue. Negative for fever and weight loss.  HENT:  Positive for congestion, hearing loss and sore throat.   Eyes:  Negative for blurred vision.  Respiratory:  Positive for cough, shortness of breath and wheezing.   Cardiovascular:  Positive for chest pain and leg swelling.  Gastrointestinal:  Positive for diarrhea and nausea. Negative for blood in stool, melena and vomiting.  Genitourinary:  Negative for dysuria.  Musculoskeletal:  Positive for  myalgias.  Skin:  Negative for rash.  Neurological:  Positive for headaches.  Endo/Heme/Allergies:  Negative for polydipsia.  Psychiatric/Behavioral:  Positive for depression.    Past Medical History:  Diagnosis Date   Anemia    Atrial tachycardia (HCC)    Breast cancer ([place])    Bronchiectasis ([place]) 06/2007   Chronic Dizziness    Colitis    COPD (chronic obstructive pulmonary disease) (HCC)    Depression    Diastolic dysfunction    a. 12/2019 Echo: EF 60-65%, no rwma, GrII DD. Nl RV size/fxn. Mild MR/AI.   DVT (deep venous thrombosis) ([place])    after her right knee surgery.    Fibromyalgia    Labile Hypertension    Migraine headache with aura    Osteoarthritis    Osteoporosis    PAD (peripheral artery disease) ([place])    Pneumonia    Polio 1952   Stenosis of carotid artery    Past Surgical History:  Procedure Laterality Date   ABDOMINAL HYSTERECTOMY     BACK SURGERY     BILATERAL TOTAL MASTECTOMY WITH AXILLARY LYMPH NODE DISSECTION     BREAST SURGERY     CARPAL TUNNEL RELEASE     bilateral    COLONOSCOPY WITH PROPOFOL N/A 08/02/2015   Procedure: COLONOSCOPY WITH PROPOFOL;  Surgeon: Manya Silvas, MD;  Location: [facility] ENDOSCOPY;  Service: Endoscopy;  Laterality: N/A;   FINGER TENDON REPAIR     HERNIA REPAIR     REPLACEMENT TOTAL KNEE  right knee    SQUAMOUS CELL CARCINOMA EXCISION     TONSILLECTOMY     TOTAL KNEE ARTHROPLASTY Left 09/19/2020   Procedure: TOTAL KNEE ARTHROPLASTY;  Surgeon: Hessie Knows, MD;  Location: [facility] ORS;  Service: Orthopedics;  Laterality: Left;   UMBILICAL HERNIA REPAIR     VAGINAL HYSTERECTOMY     Social History:  reports that she has never smoked. She has never used smokeless tobacco. She reports current alcohol use of about 4.0 standard drinks per week. She reports that she does not use drugs.  Allergies  Allergen Reactions   Ivp Dye [Iodinated Contrast Media] Anaphylaxis   Codeine Other (See Comments)    Dizziness  Dizziness    Cucumber Extract    Demeclocycline Other (See Comments)    Stomach upset    Sulfa Antibiotics Hives   Tegaderm Ag Mesh [Silver]    Telmisartan     dizziness   Tetracyclines & Related     Stomach upset     Family History  Problem Relation Age of Onset   Pancreatic cancer Mother    Arthritis Mother    Arthritis Father    Bladder Cancer Father    Atrial fibrillation Sister     Prior to Admission medications   Medication Sig Start Date End Date Taking? Authorizing Provider  ARIPiprazole (ABILIFY) 2 MG tablet Take 2 mg by mouth daily.     [provider]  Aspirin 81 MG CAPS Take 81 mg by mouth daily.    [provider]  bisoprolol (ZEBETA) 5 MG tablet Take 1 tablet (5 mg total) by mouth daily. 09/16/21   Minna Merritts, MD  cyclobenzaprine (FLEXERIL) 5 MG tablet Take 5 mg by mouth at bedtime as needed. 04/14/21   [provider]  doxazosin (CARDURA) 1 MG tablet Take 1 tablet (1 mg total) by mouth 2 (two) times daily. 01/22/22   Minna Merritts, MD  furosemide (LASIX) 20 MG tablet Take 1 tablet (20 mg total) by mouth daily. 12/23/21   Minna Merritts, MD  Galcanezumab-gnlm (EMGALITY) 120 MG/ML SOAJ Inject 120 mg into the skin every 30 (thirty) days. 06/27/20   [provider]  hydrALAZINE (APRESOLINE) 25 MG tablet Take 1 tablet (25 mg total) by mouth 3 (three) times daily as needed (for BP >150). 11/28/21   Minna Merritts, MD  hydrOXYzine (ATARAX/VISTARIL) 25 MG tablet Take 25 mg by mouth as needed for anxiety.    [provider]  NON FORMULARY Take 2 capsules by mouth in the morning and at bedtime. Algaecal    [provider]  NON FORMULARY Take 2 capsules by mouth daily. Strontium Boost    [provider]  polyethylene glycol (MIRALAX / GLYCOLAX) packet Take 17 g by mouth daily as needed for mild constipation or moderate constipation.     [provider]  potassium chloride (KLOR-CON M) 10 MEQ tablet TAKE 1  TABLET BY MOUTH ONCE DAILY 10/02/21   Minna Merritts, MD  rizatriptan (MAXALT) 5 MG tablet Take 5 mg by mouth as needed for migraine.  01/16/20   [provider]  sodium chloride HYPERTONIC 3 % nebulizer solution Take 5 mLs by nebulization every 4 (four) hours as needed. 02/09/22   [provider]  SYMBICORT 160-4.5 MCG/ACT inhaler SMARTSIG:2 Inhalation Via Inhaler Twice Daily 08/21/21   [provider]  venlafaxine XR (EFFEXOR-XR) 150 MG 24 hr capsule Take 150 mg by mouth daily. 02/02/22   [provider]  venlafaxine XR (EFFEXOR-XR) 75 MG 24 hr capsule Take 75 mg by mouth daily with breakfast.    [provider]    Physical Exam: Vitals:   03/01/22 1547 03/01/22 1551 03/01/22 1700  BP:  (!) 148/73 (!) 163/92  Pulse:  65 (!) 52  Resp:  (!) 22 20  Temp:  98.4 F (36.9 C)   TempSrc:  Oral   SpO2:  96% 97%  Weight: 59 kg    Height: '5\' 2"'$  (1.575 m)     Physical Exam HENT:     Head: Normocephalic.     Mouth/Throat:     Pharynx: No oropharyngeal exudate.  Eyes:     General: Lids are normal.     Conjunctiva/sclera: Conjunctivae normal.  Cardiovascular:     Rate and Rhythm: Normal rate and regular rhythm.     Heart sounds: Normal heart sounds, S1 normal and S2 normal.  Pulmonary:     Breath sounds: Examination of the right-middle field reveals wheezing. Examination of the left-middle field reveals wheezing. Examination of the right-lower field reveals decreased breath sounds and rales. Examination of the left-lower field reveals decreased breath sounds and rales. Decreased breath sounds, wheezing and rales present. No rhonchi.  Abdominal:     Palpations: Abdomen is soft.     Tenderness: There is no abdominal tenderness.  Musculoskeletal:     Right lower leg: Swelling present.     Left lower leg: Swelling present.  Skin:    General: Skin is warm.     Findings: No rash.  Neurological:     Mental Status: She is alert and oriented to person,  place, and time.    Data Reviewed: EKG bifascicular block 70 bpm left anterior fascicular block and right bundle branch block.  Flipped T waves laterally and inferiorly. Chest x-ray consistent with CHF ABG shows a normal pH and normal PCO2 Chemistry within normal limits except for calcium 8.5. Liver function test AST and ALT elevated at 99 and 101 Troponin 1009 BNP 1740.8 Hemoglobin slightly low at 11.6  Assessment and Plan: * Acute on chronic diastolic CHF (congestive heart failure) (HCC) Last EF 55 to 60%.  ER physician gave him 20 mg of IV Lasix.  I will give another 20 mg of IV Lasix and continue 40 mg IV twice a day.  Continue bisoprolol 5 mg daily.  We will add nitroglycerin ointment.  Patient on hydralazine.  Acute myocardial infarction (HCC) Troponin elevated at 1009.  Since the patient had chest pain 3 weeks ago in [place] and on and off intermittently since then I will start heparin drip.  Continue bisoprolol.  Already given aspirin and will continue that on a daily basis.  ER physician spoke with cardiology already.  Accelerated hypertension Blood pressure elevated in the emergency room.  Will add nitroglycerin patch, bisoprolol.  Patient already on hydralazine.  We will give another dose of IV Lasix and then continue to monitor blood pressure.  Diarrhea Send off stool studies  Elevated liver function tests AST and ALT elevated at 99 and 101 likely secondary to congestion from heart failure.  Anxiety and depression Continue Abilify and Effexor  Bronchiectasis ([place]) Continue hypertonic nebulizer solution and Symbicort      Advance Care Planning:   Code Status: Full Code   Consults: ER physician spoke with cardiology  Family Communication: Spoke with family at the bedside  Severity of Illness: The appropriate patient status for this patient is INPATIENT. Inpatient status is judged to be reasonable  and necessary in order to provide the required intensity of  service to ensure the patient's safety. The patient's presenting symptoms, physical exam findings, and initial radiographic and laboratory data in the context of their chronic comorbidities is felt to place them at high risk for further clinical deterioration. Furthermore, it is not anticipated that the patient will be medically stable for discharge from the hospital within 2 midnights of admission.   * I certify that at the point of admission it is my clinical judgment that the patient will require inpatient hospital care spanning beyond 2 midnights from the point of admission due to high intensity of service, high risk for further deterioration and high frequency of surveillance required.*  Author: Loletha Grayer, MD 03/01/2022 6:41 PM  For on call review [email or web address].

## 2022-03-01 NOTE — Assessment & Plan Note (Deleted)
Last EF 55 to 60%.  Continue Lasix 40 mg IV every 12 hours.  Patient on bisoprolol and hydralazine and nitroglycerin ointment.  Repeat echocardiogram ordered by cardiology.

## 2022-03-01 NOTE — ED Provider Notes (Signed)
[facility] Provider Note    Event Date/Time   First MD Initiated Contact with Patient 03/01/22 1748     (approximate)   History   Shortness of Breath and Leg Swelling   HPI  Lindsey [name] is a 86 y.o. female who comes from [facility].  She had gone there because she had 4 days of increasing shortness of breath.  She did not have any pain in the last couple days or 4 days.  She was seen there and thought to have CHF and was sent here for increased oxygen requirement.  She required at least 2 L of nasal cannula oxygen to maintain her sats above 90%.  This is a new thing for her.  Again she did not have any chest pain today or yesterday and did not remember any in the last 4 days.  She did have a history of bronchiectasis and thought possibly her shortness of breath was from that. Past medical history:    Bronchiectasis ([place]) 1952 Polio Date Unknown Anemia Date Unknown Atrial tachycardia ([place]) Date Unknown Breast cancer [facility]) Date Unknown Chronic Dizziness Date Unknown Colitis Date Unknown COPD (chronic obstructive pulmonary disease) ([place]) Date Unknown Depression Date Unknown Diastolic dysfunction  Date Unknown DVT (deep venous thrombosis) ([place])  Date Unknown Fibromyalgia Date Unknown Labile Hypertension Date Unknown Migraine headache with aura Date Unknown Osteoarthritis Date Unknown Osteoporosis Date Unknown PAD (peripheral artery disease) ([place]) Date Unknown Pneumonia Date Unknown Stenosis of carotid artery  Physical Exam   Triage Vital Signs: ED Triage Vitals  Enc Vitals Group     BP 03/01/22 1551 (!) 148/73     Pulse Rate 03/01/22 1551 65     Resp 03/01/22 1551 (!) 22     Temp 03/01/22 1551 98.4 F (36.9 C)     Temp Source 03/01/22 1551 Oral     SpO2 03/01/22 1551 96 %     Weight 03/01/22 1547 130 lb (59 kg)     Height 03/01/22 1547 '5\' 2"'$  (1.575 m)     Head Circumference --      Peak Flow --      Pain Score  03/01/22 1547 5     Pain Loc --      Pain Edu? --      Excl. in [place]? --     Most recent vital signs: Vitals:   03/01/22 2200 03/01/22 2230  BP: (!) 148/88 (!) 150/91  Pulse: 63 60  Resp: 18 (!) 26  Temp:    SpO2: 95% 96%     General: Awake, no distress.  Looks comfortable on 2 L nasal cannula CV:  Good peripheral perfusion.  Heart regular rate and rhythm no audible murmur Resp:  Normal effort.  Lungs are clear to my exam Abd:  No distention.   soft nontender with no palpable organomegaly Extremities mild edema bilaterally   ED Results / Procedures / Treatments   Labs (all labs ordered are listed, but only abnormal results are displayed) Labs Reviewed  BRAIN NATRIURETIC PEPTIDE - Abnormal; Notable for the following components:      Result Value   B Natriuretic Peptide 1,740.8 (*)    All other components within normal limits  COMPREHENSIVE METABOLIC PANEL - Abnormal; Notable for the following components:   Calcium 8.5 (*)    Total Protein 6.0 (*)    AST 99 (*)    ALT 101 (*)    All other components within normal limits  CBC WITH  DIFFERENTIAL/PLATELET - Abnormal; Notable for the following components:   Hemoglobin 11.6 (*)    All other components within normal limits  BLOOD GAS, VENOUS - Abnormal; Notable for the following components:   Bicarbonate 29.6 (*)    Acid-Base Excess 3.4 (*)    All other components within normal limits  TROPONIN I (HIGH SENSITIVITY) - Abnormal; Notable for the following components:   Troponin I (High Sensitivity) 1,009 (*)    All other components within normal limits  TROPONIN I (HIGH SENSITIVITY) - Abnormal; Notable for the following components:   Troponin I (High Sensitivity) 1,044 (*)    All other components within normal limits  RESP PANEL BY RT-PCR (FLU A&B, COVID) ARPGX2  C DIFFICILE QUICK SCREEN W PCR REFLEX    GASTROINTESTINAL PANEL BY PCR, STOOL (REPLACES STOOL CULTURE)  LACTIC ACID, PLASMA  LACTIC ACID, PLASMA  APTT  BASIC  METABOLIC PANEL  CBC  LIPID PANEL  HEPARIN LEVEL (UNFRACTIONATED)     EKG  EKG read interpreted by me shows normal sinus rhythm at 70 right bundle branch block flipped T's in 2 3 and F were present in the old EKG from last month but now she has flipped T's in V3 through 6 which are new.  Otherwise EKG looks similar   RADIOLOGY Chest x-ray read by radiology reviewed and interpreted by me as well shows a large heart with increased markings consistent with CHF  PROCEDURES:  Critical Care performed: Critical care time 20 minutes.  This includes speaking with the patient reviewing her old records especially clinic note from the 26th and then I had to speak with the hospitalist and the cardiologist about her markedly elevated troponin  Procedures   MEDICATIONS ORDERED IN ED: Medications  furosemide (LASIX) injection 40 mg (has no administration in time range)  bisoprolol (ZEBETA) tablet 5 mg (5 mg Oral Given 03/01/22 2027)  acetaminophen (TYLENOL) tablet 650 mg (has no administration in time range)    Or  acetaminophen (TYLENOL) suppository 650 mg (has no administration in time range)  aspirin EC tablet 81 mg (has no administration in time range)  nitroGLYCERIN (NITROSTAT) SL tablet 0.4 mg (has no administration in time range)  ondansetron (ZOFRAN) injection 4 mg (has no administration in time range)  atorvastatin (LIPITOR) tablet 40 mg (has no administration in time range)  ARIPiprazole (ABILIFY) tablet 2 mg (has no administration in time range)  hydrOXYzine (ATARAX) tablet 25 mg (has no administration in time range)  venlafaxine XR (EFFEXOR-XR) 24 hr capsule 225 mg (has no administration in time range)  polyethylene glycol (MIRALAX / GLYCOLAX) packet 17 g (has no administration in time range)  cyclobenzaprine (FLEXERIL) tablet 5 mg (has no administration in time range)  potassium chloride SA (KLOR-CON M) CR tablet 10 mEq (10 mEq Oral Given 03/01/22 2026)  sodium chloride HYPERTONIC 3  % nebulizer solution 5 mL (has no administration in time range)  mometasone-formoterol (DULERA) 200-5 MCG/ACT inhaler 2 puff (has no administration in time range)  nitroGLYCERIN (NITROGLYN) 2 % ointment 0.5 inch (0.5 inches Topical Given 03/01/22 2028)  hydrALAZINE (APRESOLINE) tablet 10 mg (10 mg Oral Given 03/01/22 2202)  heparin ADULT infusion 100 units/mL (25000 units/217m) (700 Units/hr Intravenous New Bag/Given 03/01/22 2033)  furosemide (LASIX) injection 20 mg (20 mg Intravenous Given 03/01/22 1716)  aspirin chewable tablet 324 mg (324 mg Oral Given 03/01/22 1755)  furosemide (LASIX) injection 20 mg (20 mg Intravenous Given 03/01/22 2027)  heparin bolus via infusion 3,600 Units (3,600 Units Intravenous Bolus  from Bag 03/01/22 2033)     IMPRESSION / MDM / ASSESSMENT AND PLAN / ED COURSE  I reviewed the triage vital signs and the nursing notes.  Patient with markedly elevated troponin and BNP consistent with CHF and an apparent recent NSTEMI or possibly STEMI that is resolving.  Differential diagnosis includes, but is not limited to, STEMI or NSTEMI leading to development of CHF.  Cardiac aneurysm pericarditis etc. much less likely in view of the patient's presentation with no chest pain and markedly elevated troponin  Patient's presentation is most consistent with acute presentation with potential threat to life or bodily function.  The patient is on the cardiac monitor to evaluate for evidence of arrhythmia and/or significant heart rate changes.  Nothing significant was seen      FINAL CLINICAL IMPRESSION(S) / ED DIAGNOSES   Final diagnoses:  Peripheral edema  Shortness of breath  Hypoxia  Elevated brain natriuretic peptide (BNP) level  Elevated troponin     Rx / DC Orders   ED Discharge Orders     None        Note:  This document was prepared using Dragon voice recognition software and may include unintentional dictation errors.   Nena Polio, MD 03/02/22  0001

## 2022-03-01 NOTE — Assessment & Plan Note (Signed)
Stable

## 2022-03-01 NOTE — Assessment & Plan Note (Addendum)
Improved and normotensive. -Cardiac meds as above

## 2022-03-01 NOTE — Assessment & Plan Note (Addendum)
Troponin elevated at 1009 and 1044.  Since the patient had chest pain 3 weeks ago in [place] and on and off intermittently since then I will start heparin drip.  Continue bisoprolol, aspirin and statin.  LDL 109.  Patient will be set up for cardiac catheterization tomorrow.

## 2022-03-01 NOTE — Assessment & Plan Note (Addendum)
Likely congestive hepatopathy.  Improved. -Recheck CMP at follow-up

## 2022-03-01 NOTE — Consult Note (Signed)
ANTICOAGULATION CONSULT NOTE - Initial Consult  Pharmacy Consult for heparin Indication: chest pain/ACS  Allergies  Allergen Reactions   Ivp Dye [Iodinated Contrast Media] Anaphylaxis   Codeine Other (See Comments)    Dizziness  Dizziness   Cucumber Extract    Demeclocycline Other (See Comments)    Stomach upset    Sulfa Antibiotics Hives   Tegaderm Ag Mesh [Silver]    Telmisartan     dizziness   Tetracyclines & Related     Stomach upset     Patient Measurements: Height: '5\' 2"'$  (157.5 cm) Weight: 59 kg (130 lb) IBW/kg (Calculated) : 50.1 Heparin Dosing Weight: 59 kg  Vital Signs: Temp: 98.4 F (36.9 C) (05/28 1551) Temp Source: Oral (05/28 1551) BP: 163/92 (05/28 1700) Pulse Rate: 52 (05/28 1700)  Labs: Recent Labs    03/01/22 1625  HGB 11.6*  HCT 36.8  PLT 246  CREATININE 0.55  TROPONINIHS 1,009*    Estimated Creatinine Clearance: 42.1 mL/min (by C-G formula based on SCr of 0.55 mg/dL).   Medical History: Past Medical History:  Diagnosis Date   Anemia    Atrial tachycardia (H[facility])    Breast cancer (H[facility])    Bronchiectasis ([place]) 06/2007   Chronic Dizziness    Colitis    COPD (chronic obstructive pulmonary disease) ([facility])    Depression    Diastolic dysfunction    a. 12/2019 Echo: EF 60-65%, no rwma, GrII DD. Nl RV size/fxn. Mild MR/AI.   DVT (deep venous thrombosis) ([place])    after her right knee surgery.    Fibromyalgia    Labile Hypertension    Migraine headache with aura    Osteoarthritis    Osteoporosis    PAD (peripheral artery disease) ([place])    Pneumonia    Polio 1952   Stenosis of carotid artery     Medications:  PTA: Received aspirin '325mg'$  x1 Inpatient: Allergies:   Assessment: 86yo femaile with hx for htn presents with SOB with intermittent chest pain that started 3 weeks ago. Troponin elevated at 1009. Pharmacy consulted for heparin management in the setting of ACS.  Date Time aPTT/HL Rate/Comment  Goal of Therapy:  Heparin level  0.3-0.7 units/ml Monitor platelets by anticoagulation protocol: Yes   Plan:  Ordered STAT aptt Give 3600 units bolus x1; then start heparin infusion at 700 units/hr Check anti-Xa level in 8 hours and daily once consecutively therapeutic. Continue to monitor H&H and platelets daily while on heparin gtt.   Darrick Penna 03/01/2022,6:51 PM

## 2022-03-01 NOTE — ED Triage Notes (Signed)
Patient sent by [facility] for sob X4 days. Arrived from [facility] on 2L O2 and was told she has fluid on her lungs. A&O x4 in triage. Lives in independent living at [place]

## 2022-03-02 DIAGNOSIS — I10 Essential (primary) hypertension: Secondary | ICD-10-CM | POA: Diagnosis not present

## 2022-03-02 DIAGNOSIS — R197 Diarrhea, unspecified: Secondary | ICD-10-CM | POA: Diagnosis not present

## 2022-03-02 DIAGNOSIS — I219 Acute myocardial infarction, unspecified: Secondary | ICD-10-CM | POA: Diagnosis not present

## 2022-03-02 DIAGNOSIS — I214 Non-ST elevation (NSTEMI) myocardial infarction: Secondary | ICD-10-CM | POA: Diagnosis not present

## 2022-03-02 DIAGNOSIS — I5033 Acute on chronic diastolic (congestive) heart failure: Secondary | ICD-10-CM | POA: Diagnosis not present

## 2022-03-02 LAB — LIPID PANEL
Cholesterol: 186 mg/dL (ref 0–200)
HDL: 70 mg/dL (ref >40)
LDL Cholesterol: 109 mg/dL — ABNORMAL HIGH (ref 0–99)
Total CHOL/HDL Ratio: 2.7 RATIO
Triglycerides: 37 mg/dL (ref <150)
VLDL: 7 mg/dL (ref 0–40)

## 2022-03-02 LAB — BASIC METABOLIC PANEL
Anion gap: 7 (ref 5–15)
BUN: 15 mg/dL (ref 8–23)
CO2: 28 mmol/L (ref 22–32)
Calcium: 8.3 mg/dL — ABNORMAL LOW (ref 8.9–10.3)
Chloride: 104 mmol/L (ref 98–111)
Creatinine, Ser: 0.66 mg/dL (ref 0.44–1.00)
GFR, Estimated: >60 mL/min (ref >60)
Glucose, Bld: 89 mg/dL (ref 70–99)
Potassium: 3.3 mmol/L — ABNORMAL LOW (ref 3.5–5.1)
Sodium: 139 mmol/L (ref 135–145)

## 2022-03-02 LAB — CBC
HCT: 34.8 % — ABNORMAL LOW (ref 36.0–46.0)
Hemoglobin: 11.2 g/dL — ABNORMAL LOW (ref 12.0–15.0)
MCH: 28.6 pg (ref 26.0–34.0)
MCHC: 32.2 g/dL (ref 30.0–36.0)
MCV: 88.8 fL (ref 80.0–100.0)
Platelets: 227 10*3/uL (ref 150–400)
RBC: 3.92 MIL/uL (ref 3.87–5.11)
RDW: 14.6 % (ref 11.5–15.5)
WBC: 5.4 10*3/uL (ref 4.0–10.5)
nRBC: 0 % (ref 0.0–0.2)

## 2022-03-02 LAB — HEPARIN LEVEL (UNFRACTIONATED)
Heparin Unfractionated: 0.18 IU/mL — ABNORMAL LOW (ref 0.30–0.70)
Heparin Unfractionated: 0.32 IU/mL (ref 0.30–0.70)
Heparin Unfractionated: 0.28 IU/mL — ABNORMAL LOW (ref 0.30–0.70)

## 2022-03-02 MED ORDER — PREDNISONE 50 MG PO TABS
50.0000 mg | ORAL_TABLET | Freq: Four times a day (QID) | ORAL | Status: AC
  Administered 2022-03-02 – 2022-03-03 (×3): 50 mg via ORAL
  Filled 2022-03-02 (×3): qty 1

## 2022-03-02 MED ORDER — HEPARIN BOLUS VIA INFUSION
1700.0000 [IU] | Freq: Once | INTRAVENOUS | Status: AC
  Administered 2022-03-02: 1700 [IU] via INTRAVENOUS
  Filled 2022-03-02: qty 1700

## 2022-03-02 MED ORDER — FAMOTIDINE 20 MG PO TABS: 40.0000 mg | ORAL_TABLET | Freq: Once | ORAL | Status: AC

## 2022-03-02 MED ORDER — POTASSIUM CHLORIDE CRYS ER 20 MEQ PO TBCR
20.0000 meq | EXTENDED_RELEASE_TABLET | Freq: Two times a day (BID) | ORAL | Status: DC
  Administered 2022-03-02 – 2022-03-04 (×4): 20 meq via ORAL
  Filled 2022-03-02 (×4): qty 1

## 2022-03-02 MED ORDER — DIPHENHYDRAMINE HCL 50 MG/ML IJ SOLN: 50.0000 mg | Freq: Once | INTRAMUSCULAR | Status: AC

## 2022-03-02 MED ORDER — HEPARIN (PORCINE) 25000 UT/250ML-% IV SOLN
1050.0000 [IU]/h | INTRAVENOUS | Status: DC
  Administered 2022-03-03: 1050 [IU]/h via INTRAVENOUS

## 2022-03-02 MED ORDER — HEPARIN BOLUS VIA INFUSION
900.0000 [IU] | Freq: Once | INTRAVENOUS | Status: AC
  Administered 2022-03-03: 900 [IU] via INTRAVENOUS
  Filled 2022-03-02: qty 900

## 2022-03-02 MED ORDER — DIPHENHYDRAMINE HCL 25 MG PO CAPS
50.0000 mg | ORAL_CAPSULE | Freq: Once | ORAL | Status: AC
  Administered 2022-03-03: 50 mg via ORAL
  Filled 2022-03-02: qty 2

## 2022-03-02 NOTE — H&P (View-Only) (Signed)
Cardiology Consultation:   Patient ID: Lindsey [name]; [id number]; [birth year]   Admit date: 03/01/2022 Date of Consult: 03/02/2022  Primary Care Provider: Tracie Harrier, MD Primary Cardiologist: Rockey Situ Primary Electrophysiologist:  None   Patient Profile:   Lindsey [name] is a 86 y.o. female with a hx of atrial tachycardia, right lower extremity DVT status post IVC filter, bronchiectasis, labile hypertension, and lower extremity swelling who is being seen today for the evaluation of NSTEMI and volume overload at the request of Dr. Leslye Peer, MD.  History of Present Illness:   Ms. Lindsey [name] underwent outpatient cardiac monitoring in 10/2021, for dizziness, which showed sinus rhythm with an average rate of 55 bpm, 5 runs of SVT/atrial tachycardia with the fastest and longest interval lasting 10 beats, and frequent PACs representing a 12.9% burden.  Echo in 11/2021 demonstrated an EF of 55 to 60%, no regional wall motion abnormalities, mildly dilated LV internal cavity size, mild LVH, indeterminate LV diastolic function parameters, normal RV systolic function with mildly enlarged ventricular cavity size, mild biatrial enlargement, mild mitral regurgitation, mild aortic insufficiency, and an estimated right atrial pressure of 3 mmHg.  She recently traveled to [place] and reported severe deep left sided chest aching while there without associated symptoms that lasted for approximately 30 minutes.  She did not seek medical attention at that time.  Since returning back to [place], she has noted a progression of lower extremity swelling, abdominal distention, and dyspnea.  There has been some associated early satiety.  No significant orthopnea.  She has not had any further chest aching.  She was admitted to [facility] on 03/02/2022 with progressive lower extremity swelling, abdominal distention, dyspnea, and an approximate 15 pound weight gain.  She also has noted diarrhea for 2-1/2 weeks,  coughing, and wheezing.  She has noted intermittent chest pain that has been waking her up from sleep over the past several weeks.  BP has ranged from the 140s to 160s in the ED.  Initially hypoxic in the 80s.  Afebrile.  Oxygen saturations 96% on 2 L.  Initial high-sensitivity troponin 1009 with a delta troponin of 1044, BNP 1740, AST 99, ALT 101, Hgb 11.6.  GI panel/C. difficile pending.  Chest x-ray with cardiomegaly with mild diffuse interstitial opacity and small pleural effusions consistent with edema.  She has been started on a heparin drip as well as IV Lasix with noted symptomatic improvement in abdominal distention and dyspnea.  Currently without orthopnea.  Chest pain-free.  Documented urine output 3.3 L for the admission.    Past Medical History:  Diagnosis Date   Anemia    Atrial tachycardia (HCC)    Breast cancer ([place])    Bronchiectasis ([place]) 06/2007   Chronic Dizziness    Colitis    COPD (chronic obstructive pulmonary disease) (HCC)    Depression    Diastolic dysfunction    a. 12/2019 Echo: EF 60-65%, no rwma, GrII DD. Nl RV size/fxn. Mild MR/AI.   DVT (deep venous thrombosis) ([place])    after her right knee surgery.    Fibromyalgia    Labile Hypertension    Migraine headache with aura    Osteoarthritis    Osteoporosis    PAD (peripheral artery disease) ([place])    Pneumonia    Polio 1952   Stenosis of carotid artery     Past Surgical History:  Procedure Laterality Date   ABDOMINAL HYSTERECTOMY     BACK SURGERY     BILATERAL TOTAL MASTECTOMY  WITH AXILLARY LYMPH NODE DISSECTION     BREAST SURGERY     CARPAL TUNNEL RELEASE     bilateral    COLONOSCOPY WITH PROPOFOL N/A 08/02/2015   Procedure: COLONOSCOPY WITH PROPOFOL;  Surgeon: Manya Silvas, MD;  Location: [facility] ENDOSCOPY;  Service: Endoscopy;  Laterality: N/A;   FINGER TENDON REPAIR     HERNIA REPAIR     REPLACEMENT TOTAL KNEE     right knee    SQUAMOUS CELL CARCINOMA EXCISION     TONSILLECTOMY     TOTAL KNEE  ARTHROPLASTY Left 09/19/2020   Procedure: TOTAL KNEE ARTHROPLASTY;  Surgeon: Hessie Knows, MD;  Location: [facility] ORS;  Service: Orthopedics;  Laterality: Left;   UMBILICAL HERNIA REPAIR     VAGINAL HYSTERECTOMY       Home Meds: Prior to Admission medications   Medication Sig Start Date End Date Taking? Authorizing Provider  ARIPiprazole (ABILIFY) 2 MG tablet Take 2 mg by mouth daily.    Yes [provider]  Aspirin 81 MG CAPS Take 81 mg by mouth daily.   Yes [provider]  bisoprolol (ZEBETA) 5 MG tablet Take 1 tablet (5 mg total) by mouth daily. 09/16/21  Yes Minna Merritts, MD  doxazosin (CARDURA) 1 MG tablet Take 1 tablet (1 mg total) by mouth 2 (two) times daily. 01/22/22  Yes Gollan, Kathlene November, MD  furosemide (LASIX) 20 MG tablet Take 1 tablet (20 mg total) by mouth daily. 12/23/21  Yes Minna Merritts, MD  hydrALAZINE (APRESOLINE) 25 MG tablet Take 1 tablet (25 mg total) by mouth 3 (three) times daily as needed (for BP >150). 11/28/21  Yes Gollan, Kathlene November, MD  potassium chloride (KLOR-CON M) 10 MEQ tablet TAKE 1 TABLET BY MOUTH ONCE DAILY 10/02/21  Yes Gollan, Kathlene November, MD  rizatriptan (MAXALT) 5 MG tablet Take 5 mg by mouth as needed for migraine.  01/16/20  Yes [provider]  sodium chloride HYPERTONIC 3 % nebulizer solution Take 5 mLs by nebulization every 4 (four) hours as needed. 02/09/22  Yes [provider]  SYMBICORT 160-4.5 MCG/ACT inhaler SMARTSIG:2 Inhalation Via Inhaler Twice Daily 08/21/21  Yes [provider]  venlafaxine XR (EFFEXOR-XR) 150 MG 24 hr capsule Take 150 mg by mouth daily. 02/02/22  Yes [provider]  cyclobenzaprine (FLEXERIL) 5 MG tablet Take 5 mg by mouth at bedtime as needed. Patient not taking: Reported on 03/01/2022 04/14/21   [provider]  Galcanezumab-gnlm (EMGALITY) 120 MG/ML SOAJ Inject 120 mg into the skin every 30 (thirty) days. Patient not taking: Reported on 03/01/2022 06/27/20    [provider]  hydrOXYzine (ATARAX/VISTARIL) 25 MG tablet Take 25 mg by mouth as needed for anxiety.    [provider]  polyethylene glycol (MIRALAX / GLYCOLAX) packet Take 17 g by mouth daily as needed for mild constipation or moderate constipation.     [provider]  venlafaxine XR (EFFEXOR-XR) 75 MG 24 hr capsule Take 75 mg by mouth daily with breakfast. Patient not taking: Reported on 03/01/2022    [provider]    Inpatient Medications: Scheduled Meds:  ARIPiprazole  2 mg Oral Daily   aspirin EC  81 mg Oral Daily   atorvastatin  40 mg Oral Daily   bisoprolol  5 mg Oral Daily   furosemide  40 mg Intravenous Q12H   hydrALAZINE  10 mg Oral Q8H   mometasone-formoterol  2 puff Inhalation BID   nitroGLYCERIN  0.5 inch Topical  Q6H   potassium chloride  20 mEq Oral BID   venlafaxine XR  225 mg Oral Daily   Continuous Infusions:  heparin 900 Units/hr (03/02/22 0506)   PRN Meds: acetaminophen **OR** acetaminophen, cyclobenzaprine, hydrOXYzine, nitroGLYCERIN, ondansetron (ZOFRAN) IV, polyethylene glycol, sodium chloride HYPERTONIC  Allergies:   Allergies  Allergen Reactions   Ivp Dye [Iodinated Contrast Media] Anaphylaxis   Codeine Other (See Comments)    Dizziness  Dizziness   Cucumber Extract    Demeclocycline Other (See Comments)    Stomach upset    Sulfa Antibiotics Hives   Tegaderm Ag Mesh [Silver]    Telmisartan     dizziness   Tetracyclines & Related     Stomach upset     Social History:   Social History   Socioeconomic History   Marital status: Widowed    Spouse name: Not on file   Number of children: Not on file   Years of education: Not on file   Highest education level: Not on file  Occupational History   Not on file  Tobacco Use   Smoking status: Never   Smokeless tobacco: Never  Vaping Use   Vaping Use: Never used  Substance and Sexual Activity   Alcohol use: Yes    Alcohol/week: 4.0 standard drinks     Types: 4 Glasses of wine per week   Drug use: No   Sexual activity: Not on file  Other Topics Concern   Not on file  Social History Narrative   Not on file   Social Determinants of Health   Financial Resource Strain: Not on file  Food Insecurity: Not on file  Transportation Needs: Not on file  Physical Activity: Not on file  Stress: Not on file  Social Connections: Not on file  Intimate Partner Violence: Not on file     Family History:   Family History  Problem Relation Age of Onset   Pancreatic cancer Mother    Arthritis Mother    Arthritis Father    Bladder Cancer Father    Atrial fibrillation Sister     ROS:  Review of Systems  Constitutional:  Positive for malaise/fatigue. Negative for chills, diaphoresis, fever and weight loss.  HENT:  Negative for congestion.   Eyes:  Negative for discharge and redness.  Respiratory:  Positive for cough, shortness of breath and wheezing. Negative for sputum production.   Cardiovascular:  Positive for chest pain and leg swelling. Negative for palpitations, orthopnea, claudication and PND.  Gastrointestinal:  Negative for abdominal pain, heartburn, nausea and vomiting.  Musculoskeletal:  Negative for falls and myalgias.  Skin:  Negative for rash.  Neurological:  Negative for dizziness, tingling, tremors, sensory change, speech change, focal weakness, loss of consciousness and weakness.  Endo/Heme/Allergies:  Does not bruise/bleed easily.  Psychiatric/Behavioral:  Negative for substance abuse. The patient is not nervous/anxious.   All other systems reviewed and are negative.    Physical Exam/Data:   Vitals:   03/02/22 0400 03/02/22 0500 03/02/22 0600 03/02/22 0813  BP: (!) 155/85 (!) 165/96 (!) 149/82 (!) 143/97  Pulse: (!) 41 (!) 49 (!) 57 70  Resp: 20 18 (!) 21 20  Temp:    97.6 F (36.4 C)  TempSrc:    Oral  SpO2: 94% 95% 96% 95%  Weight:      Height:        Intake/Output Summary (Last 24 hours) at 03/02/2022 0838 Last  data filed at 03/02/2022 3419 Gross per 24 hour  Intake 80.8  ml  Output 3400 ml  Net -3319.2 ml   Filed Weights   03/01/22 1547  Weight: 59 kg   Body mass index is 23.78 kg/m.   Physical Exam: General: Well developed, well nourished, in no acute distress. Head: Normocephalic, atraumatic, sclera non-icteric, no xanthomas, nares without discharge.  Neck: Negative for carotid bruits. JVD not elevated. Lungs: Diminished breath sounds along the bases bilaterally. Breathing is unlabored. Heart: RRR with S1 S2. I/VI systolic murmur, no rubs, or gallops appreciated. Abdomen: Soft, non-tender, non-distended with normoactive bowel sounds. No hepatomegaly. No rebound/guarding. No obvious abdominal masses. Msk:  Strength and tone appear normal for age. Extremities: No clubbing or cyanosis. No edema. Distal pedal pulses are 2+ and equal bilaterally. Neuro: Alert and oriented X 3. No facial asymmetry. No focal deficit. Moves all extremities spontaneously. Psych:  Responds to questions appropriately with a normal affect.   EKG:  The EKG was personally reviewed and demonstrates: NSR, 70 bpm, bifascicular block, frequent PACs, inferior T wave inversion Telemetry:  Telemetry was personally reviewed and demonstrates: SR with PACs  Weights: Filed Weights   03/01/22 1547  Weight: 59 kg    Relevant CV Studies:  2D echo 11/24/2021: 1. Left ventricular ejection fraction, by estimation, is 55 to 60%. The  left ventricle has normal function. The left ventricle has no regional  wall motion abnormalities. The left ventricular internal cavity size was  mildly dilated. There is mild left  ventricular hypertrophy. Left ventricular diastolic parameters are  indeterminate.   2. Right ventricular systolic function is normal. The right ventricular  size is mildly enlarged.   3. Left atrial size was mildly dilated.   4. Right atrial size was mildly dilated.   5. The mitral valve is normal in structure.  Mild mitral valve  regurgitation.   6. The aortic valve is grossly normal. Aortic valve regurgitation is  mild.   7. The inferior vena cava is normal in size with greater than 50%  respiratory variability, suggesting right atrial pressure of 3 mmHg.   Comparison(s): LVEF 60-65%. __________  Elwyn Reach patch 10/2021: Normal sinus rhythm avg HR of 55 bpm.    5 Supraventricular Tachycardia/atrial tachycardia runs occurred, the run with  the fastest interval lasting 10 beats with a max rate of 143 bpm (avg 136 bpm); the run with the fastest interval was also the longest.    Isolated SVEs were frequent (12.9%, 17616), SVE Couplets were rare (<1.0%, 1059), and SVE Triplets were rare (<1.0%, 17). Isolated VEs were rare (<1.0%), and no VE Couplets or VE Triplets were present.   Patient triggered events associated with PACs __________  2D echo 01/03/2020: 1. Left ventricular ejection fraction, by estimation, is 60 to 65%. The  left ventricle has normal function. The left ventricle has no regional  wall motion abnormalities. Left ventricular diastolic parameters are  consistent with Grade II diastolic  dysfunction (pseudonormalization).   2. Right ventricular systolic function is normal. The right ventricular  size is normal.   3. The mitral valve is normal in structure. Mild mitral valve  regurgitation.   4. The aortic valve is normal in structure. Aortic valve regurgitation is  mild.   Laboratory Data:  Chemistry Recent Labs  Lab 03/01/22 1625 03/02/22 0352  NA 136 139  K 3.6 3.3*  CL 105 104  CO2 25 28  GLUCOSE 82 89  BUN 16 15  CREATININE 0.55 0.66  CALCIUM 8.5* 8.3*  GFRNONAA >60 >60  ANIONGAP 6 7  Recent Labs  Lab 03/01/22 1625  PROT 6.0*  ALBUMIN 3.6  AST 99*  ALT 101*  ALKPHOS 86  BILITOT 0.7   Hematology Recent Labs  Lab 03/01/22 1625 03/02/22 0352  WBC 5.6 5.4  RBC 4.08 3.92  HGB 11.6* 11.2*  HCT 36.8 34.8*  MCV 90.2 88.8  MCH 28.4 28.6  MCHC 31.5  32.2  RDW 14.6 14.6  PLT 246 227   Cardiac EnzymesNo results for input(s): TROPONINI in the last 168 hours. No results for input(s): TROPIPOC in the last 168 hours.  BNP Recent Labs  Lab 03/01/22 1625  BNP 1,740.8*    DDimer No results for input(s): DDIMER in the last 168 hours.  Radiology/Studies:  DG Chest 2 View  Result Date: 03/01/2022 IMPRESSION: Cardiomegaly with mild diffuse interstitial opacity and small pleural effusions, consistent with edema. No focal airspace opacity. Electronically Signed   By: Delanna Ahmadi M.D.   On: 03/01/2022 16:19    Assessment and Plan:   1.  NSTEMI: -Currently chest pain-free -High-sensitivity troponin 1044 -Aspirin -Heparin drip -Nitropaste -Echo -Plan for cardiac cath in the next 24 to 48 hours, following diuresis  2.  Acute on chronic HFpEF: -Volume status improving with IV diuresis, continue -Update echo to evaluate for new cardiomyopathy/wall motion abnormality -Continue bisoprolol, nitro, and hydralazine -Look to add SGLT2 inhibitor prior to discharge -Daily weights -Strict I's and O's  3.  History of right lower extremity DVT status post IVC filter: -Recent prolonged travel to [place] and back -Further management per primary service  4.  Transaminitis: -Cannot exclude congestive hepatopathy -Diuresis as above -Monitor  5.  HTN: -Blood pressure remains mildly elevated -Continue medical therapy as outlined above    For questions or updates, please contact [place] Please consult [email or web address] for contact info under Cardiology/STEMI.   Signed, Christell Faith, PA-C [place] Pager: [phone] 03/02/2022, 8:38 AM

## 2022-03-02 NOTE — Consult Note (Signed)
ANTICOAGULATION CONSULT NOTE -   Pharmacy Consult for heparin Indication: chest pain/ACS  Allergies  Allergen Reactions   Ivp Dye [Iodinated Contrast Media] Anaphylaxis   Codeine Other (See Comments)    Dizziness  Dizziness   Cucumber Extract    Demeclocycline Other (See Comments)    Stomach upset    Sulfa Antibiotics Hives   Tegaderm Ag Mesh [Silver]    Telmisartan     dizziness   Tetracyclines & Related     Stomach upset     Patient Measurements: Height: '5\' 2"'$  (157.5 cm) Weight: 58.6 kg (129 lb 1.6 oz) IBW/kg (Calculated) : 50.1 Heparin Dosing Weight: 59 kg  Vital Signs: Temp: 98.6 F (37 C) (05/29 1457) Temp Source: Oral (05/29 1457) BP: 126/80 (05/29 1457) Pulse Rate: 60 (05/29 1457)  Labs: Recent Labs    03/01/22 1625 03/01/22 1825 03/01/22 2010 03/02/22 0352 03/02/22 1357  HGB 11.6*  --   --  11.2*  --   HCT 36.8  --   --  34.8*  --   PLT 246  --   --  227  --   APTT  --   --  30  --   --   HEPARINUNFRC  --   --   --  0.18* 0.32  CREATININE 0.55  --   --  0.66  --   TROPONINIHS 1,009* 1,044*  --   --   --      Estimated Creatinine Clearance: 42.1 mL/min (by C-G formula based on SCr of 0.66 mg/dL).   Medical History: Past Medical History:  Diagnosis Date   Anemia    Atrial tachycardia (HCC)    Breast cancer (HCC)    Bronchiectasis ([place]) 06/2007   Chronic Dizziness    Colitis    COPD (chronic obstructive pulmonary disease) (HCC)    Depression    Diastolic dysfunction    a. 12/2019 Echo: EF 60-65%, no rwma, GrII DD. Nl RV size/fxn. Mild MR/AI.   DVT (deep venous thrombosis) ([place])    after her right knee surgery.    Fibromyalgia    Labile Hypertension    Migraine headache with aura    Osteoarthritis    Osteoporosis    PAD (peripheral artery disease) ([place])    Pneumonia    Polio 1952   Stenosis of carotid artery     Medications:  PTA: Received aspirin '325mg'$  x1 Inpatient: Allergies:   Assessment: 86yo femaile with hx for htn  presents with SOB with intermittent chest pain that started 3 weeks ago. Troponin elevated at 1009. Pharmacy consulted for heparin management in the setting of ACS.  Date Time aPTT/HL Rate/Comment 05/29 1357 0.32  therapeutic  Goal of Therapy:  Heparin level 0.3-0.7 units/ml Monitor platelets by anticoagulation protocol: Yes   Plan:  05/29 1357  HL=0.32 therapeutic  Will continue current drip rate of9 00 units/hr.  Will check confirmatory in HL 8 hrs CBC daily  Sydnie Sigmund A 03/02/2022,3:04 PM

## 2022-03-02 NOTE — Progress Notes (Signed)
  Progress Note   Patient: Lindsey [name] MBW:[id number] DOB: 03-18-1936 DOA: 03/01/2022     1 DOS: the patient was seen and examined on 03/02/2022     Assessment and Plan: * Acute on chronic diastolic CHF (congestive heart failure) (HCC) Last EF 55 to 60%.  Continue Lasix 40 mg IV every 12 hours.  Patient on bisoprolol and hydralazine and nitroglycerin ointment.  Repeat echocardiogram ordered by cardiology.  Acute myocardial infarction (HCC) Troponin elevated at 1009 and 1044.  Since the patient had chest pain 3 weeks ago in [place] and on and off intermittently since then I will start heparin drip.  Continue bisoprolol, aspirin and statin.  LDL 109.  Patient will be set up for cardiac catheterization tomorrow.  Accelerated hypertension Blood pressure very elevated on presentation to the emergency room.  Continue Lasix IV, bisoprolol, hydralazine and nitroglycerin.  Continue to monitor  Diarrhea Send off stool studies if has further bowel movement.  Elevated liver function tests AST and ALT elevated at 99 and 101 likely secondary to congestion from heart failure.  Recheck liver function test tomorrow.  Anxiety and depression Continue Abilify and Effexor  Bronchiectasis (HCC) Continue hypertonic nebulizer solution and Symbicort        Subjective: Patient feeling a little bit better today.  Less short of breath.  A little less swelling of her legs and abdomen.  No chest pain since admission.  Physical Exam: Vitals:   03/02/22 0500 03/02/22 0600 03/02/22 0813 03/02/22 1113  BP: (!) 165/96 (!) 149/82 (!) 143/97 128/90  Pulse: (!) 49 (!) 57 70 62  Resp: 18 (!) 21 20 (!) 21  Temp:   97.6 F (36.4 C) 98.4 F (36.9 C)  TempSrc:   Oral Oral  SpO2: 95% 96% 95% 95%  Weight:      Height:       Physical Exam HENT:     Head: Normocephalic.     Mouth/Throat:     Pharynx: No oropharyngeal exudate.  Eyes:     General: Lids are normal.     Conjunctiva/sclera: Conjunctivae  normal.  Cardiovascular:     Rate and Rhythm: Normal rate and regular rhythm.     Heart sounds: Normal heart sounds, S1 normal and S2 normal.  Pulmonary:     Breath sounds: Examination of the right-lower field reveals decreased breath sounds and rales. Examination of the left-lower field reveals decreased breath sounds and rales. Decreased breath sounds and rales present. No wheezing or rhonchi.  Abdominal:     Palpations: Abdomen is soft.     Tenderness: There is no abdominal tenderness.  Musculoskeletal:     Right lower leg: Swelling present.     Left lower leg: Swelling present.  Skin:    General: Skin is warm.     Findings: No rash.  Neurological:     Mental Status: She is alert and oriented to person, place, and time.    Data Reviewed: LDL 109, hemoglobin 11.2, potassium 3.3, creatinine 0.66   Disposition: Status is: Inpatient Remains inpatient appropriate because: Will require cardiac catheterization for tomorrow.  Continue IV heparin.  Planned Discharge Destination: Home   Author: Loletha Grayer, MD 03/02/2022 11:19 AM  For on call review [email or web address].

## 2022-03-02 NOTE — Consult Note (Signed)
ANTICOAGULATION CONSULT NOTE -   Pharmacy Consult for heparin Indication: chest pain/ACS  Allergies  Allergen Reactions   Ivp Dye [Iodinated Contrast Media] Anaphylaxis   Codeine Other (See Comments)    Dizziness  Dizziness   Cucumber Extract    Demeclocycline Other (See Comments)    Stomach upset    Sulfa Antibiotics Hives   Tegaderm Ag Mesh [Silver]    Telmisartan     dizziness   Tetracyclines & Related     Stomach upset     Patient Measurements: Height: '5\' 2"'$  (157.5 cm) Weight: 58.6 kg (129 lb 1.6 oz) IBW/kg (Calculated) : 50.1 Heparin Dosing Weight: 59 kg  Vital Signs: Temp: 97.8 F (36.6 C) (05/29 1956) Temp Source: Oral (05/29 1457) BP: 153/91 (05/29 2231) Pulse Rate: 64 (05/29 1956)  Labs: Recent Labs    03/01/22 1625 03/01/22 1825 03/01/22 2010 03/02/22 0352 03/02/22 1357 03/02/22 2209  HGB 11.6*  --   --  11.2*  --   --   HCT 36.8  --   --  34.8*  --   --   PLT 246  --   --  227  --   --   APTT  --   --  30  --   --   --   HEPARINUNFRC  --   --   --  0.18* 0.32 0.28*  CREATININE 0.55  --   --  0.66  --   --   TROPONINIHS 1,009* 1,044*  --   --   --   --      Estimated Creatinine Clearance: 42.1 mL/min (by C-G formula based on SCr of 0.66 mg/dL).   Medical History: Past Medical History:  Diagnosis Date   Anemia    Atrial tachycardia (HCC)    Breast cancer (HCC)    Bronchiectasis ([place]) 06/2007   Chronic Dizziness    Colitis    COPD (chronic obstructive pulmonary disease) (HCC)    Depression    Diastolic dysfunction    a. 12/2019 Echo: EF 60-65%, no rwma, GrII DD. Nl RV size/fxn. Mild MR/AI.   DVT (deep venous thrombosis) ([place])    after her right knee surgery.    Fibromyalgia    Labile Hypertension    Migraine headache with aura    Osteoarthritis    Osteoporosis    PAD (peripheral artery disease) ([place])    Pneumonia    Polio 1952   Stenosis of carotid artery     Medications:  PTA: Received aspirin '325mg'$  x1 Inpatient: Allergies:    Assessment: 85yo femaile with hx for htn presents with SOB with intermittent chest pain that started 3 weeks ago. Troponin elevated at 1009. Pharmacy consulted for heparin management in the setting of ACS.  Date Time aPTT/HL Rate/Comment 05/29 1357 0.32  Therapeutic 05/29 2209 0.28  Subtherapeutic  Goal of Therapy:  Heparin level 0.3-0.7 units/ml Monitor platelets by anticoagulation protocol: Yes   Plan:  Bolus 900 units x 1 Will increase current drip rate to 1050 units/hr.  Will recheck HL w/ AM labs after rate change. CBC daily  Renda Rolls, PharmD, [facility] 03/02/2022 11:16 PM

## 2022-03-02 NOTE — Progress Notes (Signed)
Spoke with Dr. Harrington Challenger to figure out when cardiac cath is scheduled. Per md cath will be at 10:30 on 5/30. Spoke with pharmacist to make aware of specific medications that need to be timed before patient goes to heart cath. Medications were released from signed and held orders so pharmacy can reschedule them to be given at correct time

## 2022-03-02 NOTE — Consult Note (Signed)
Cardiology Consultation:   Patient ID: MAZZY SANTARELLI; [id number]; 12-24-1935   Admit date: 03/01/2022 Date of Consult: 03/02/2022  Primary Care Provider: Tracie Harrier, MD Primary Cardiologist: Rockey Situ Primary Electrophysiologist:  None   Patient Profile:   Lindsey [name] is a 86 y.o. female with a hx of atrial tachycardia, right lower extremity DVT status post IVC filter, bronchiectasis, labile hypertension, and lower extremity swelling who is being seen today for the evaluation of NSTEMI and volume overload at the request of Dr. Leslye Peer, MD.  History of Present Illness:   Lindsey [name] underwent outpatient cardiac monitoring in 10/2021, for dizziness, which showed sinus rhythm with an average rate of 55 bpm, 5 runs of SVT/atrial tachycardia with the fastest and longest interval lasting 10 beats, and frequent PACs representing a 12.9% burden.  Echo in 11/2021 demonstrated an EF of 55 to 60%, no regional wall motion abnormalities, mildly dilated LV internal cavity size, mild LVH, indeterminate LV diastolic function parameters, normal RV systolic function with mildly enlarged ventricular cavity size, mild biatrial enlargement, mild mitral regurgitation, mild aortic insufficiency, and an estimated right atrial pressure of 3 mmHg.  She recently traveled to [place] and reported severe deep left sided chest aching while there without associated symptoms that lasted for approximately 30 minutes.  She did not seek medical attention at that time.  Since returning back to [place], she has noted a progression of lower extremity swelling, abdominal distention, and dyspnea.  There has been some associated early satiety.  No significant orthopnea.  She has not had any further chest aching.  She was admitted to [facility] on 03/02/2022 with progressive lower extremity swelling, abdominal distention, dyspnea, and an approximate 15 pound weight gain.  She also has noted diarrhea for 2-1/2 weeks,  coughing, and wheezing.  She has noted intermittent chest pain that has been waking her up from sleep over the past several weeks.  BP has ranged from the 140s to 160s in the ED.  Initially hypoxic in the 80s.  Afebrile.  Oxygen saturations 96% on 2 L.  Initial high-sensitivity troponin 1009 with a delta troponin of 1044, BNP 1740, AST 99, ALT 101, Hgb 11.6.  GI panel/C. difficile pending.  Chest x-ray with cardiomegaly with mild diffuse interstitial opacity and small pleural effusions consistent with edema.  She has been started on a heparin drip as well as IV Lasix with noted symptomatic improvement in abdominal distention and dyspnea.  Currently without orthopnea.  Chest pain-free.  Documented urine output 3.3 L for the admission.    Past Medical History:  Diagnosis Date   Anemia    Atrial tachycardia (HCC)    Breast cancer ([place])    Bronchiectasis ([place]) 06/2007   Chronic Dizziness    Colitis    COPD (chronic obstructive pulmonary disease) (HCC)    Depression    Diastolic dysfunction    a. 12/2019 Echo: EF 60-65%, no rwma, GrII DD. Nl RV size/fxn. Mild MR/AI.   DVT (deep venous thrombosis) ([place])    after her right knee surgery.    Fibromyalgia    Labile Hypertension    Migraine headache with aura    Osteoarthritis    Osteoporosis    PAD (peripheral artery disease) ([place])    Pneumonia    Polio 1952   Stenosis of carotid artery     Past Surgical History:  Procedure Laterality Date   ABDOMINAL HYSTERECTOMY     BACK SURGERY     BILATERAL TOTAL MASTECTOMY  WITH AXILLARY LYMPH NODE DISSECTION     BREAST SURGERY     CARPAL TUNNEL RELEASE     bilateral    COLONOSCOPY WITH PROPOFOL N/A 08/02/2015   Procedure: COLONOSCOPY WITH PROPOFOL;  Surgeon: Manya Silvas, MD;  Location: [facility] ENDOSCOPY;  Service: Endoscopy;  Laterality: N/A;   FINGER TENDON REPAIR     HERNIA REPAIR     REPLACEMENT TOTAL KNEE     right knee    SQUAMOUS CELL CARCINOMA EXCISION     TONSILLECTOMY     TOTAL KNEE  ARTHROPLASTY Left 09/19/2020   Procedure: TOTAL KNEE ARTHROPLASTY;  Surgeon: Hessie Knows, MD;  Location: [facility] ORS;  Service: Orthopedics;  Laterality: Left;   UMBILICAL HERNIA REPAIR     VAGINAL HYSTERECTOMY       Home Meds: Prior to Admission medications   Medication Sig Start Date End Date Taking? Authorizing Provider  ARIPiprazole (ABILIFY) 2 MG tablet Take 2 mg by mouth daily.    Yes [provider]  Aspirin 81 MG CAPS Take 81 mg by mouth daily.   Yes [provider]  bisoprolol (ZEBETA) 5 MG tablet Take 1 tablet (5 mg total) by mouth daily. 09/16/21  Yes Minna Merritts, MD  doxazosin (CARDURA) 1 MG tablet Take 1 tablet (1 mg total) by mouth 2 (two) times daily. 01/22/22  Yes Gollan, Kathlene November, MD  furosemide (LASIX) 20 MG tablet Take 1 tablet (20 mg total) by mouth daily. 12/23/21  Yes Minna Merritts, MD  hydrALAZINE (APRESOLINE) 25 MG tablet Take 1 tablet (25 mg total) by mouth 3 (three) times daily as needed (for BP >150). 11/28/21  Yes Gollan, Kathlene November, MD  potassium chloride (KLOR-CON M) 10 MEQ tablet TAKE 1 TABLET BY MOUTH ONCE DAILY 10/02/21  Yes Gollan, Kathlene November, MD  rizatriptan (MAXALT) 5 MG tablet Take 5 mg by mouth as needed for migraine.  01/16/20  Yes [provider]  sodium chloride HYPERTONIC 3 % nebulizer solution Take 5 mLs by nebulization every 4 (four) hours as needed. 02/09/22  Yes [provider]  SYMBICORT 160-4.5 MCG/ACT inhaler SMARTSIG:2 Inhalation Via Inhaler Twice Daily 08/21/21  Yes [provider]  venlafaxine XR (EFFEXOR-XR) 150 MG 24 hr capsule Take 150 mg by mouth daily. 02/02/22  Yes [provider]  cyclobenzaprine (FLEXERIL) 5 MG tablet Take 5 mg by mouth at bedtime as needed. Patient not taking: Reported on 03/01/2022 04/14/21   [provider]  Galcanezumab-gnlm (EMGALITY) 120 MG/ML SOAJ Inject 120 mg into the skin every 30 (thirty) days. Patient not taking: Reported on 03/01/2022 06/27/20    [provider]  hydrOXYzine (ATARAX/VISTARIL) 25 MG tablet Take 25 mg by mouth as needed for anxiety.    [provider]  polyethylene glycol (MIRALAX / GLYCOLAX) packet Take 17 g by mouth daily as needed for mild constipation or moderate constipation.     [provider]  venlafaxine XR (EFFEXOR-XR) 75 MG 24 hr capsule Take 75 mg by mouth daily with breakfast. Patient not taking: Reported on 03/01/2022    [provider]    Inpatient Medications: Scheduled Meds:  ARIPiprazole  2 mg Oral Daily   aspirin EC  81 mg Oral Daily   atorvastatin  40 mg Oral Daily   bisoprolol  5 mg Oral Daily   furosemide  40 mg Intravenous Q12H   hydrALAZINE  10 mg Oral Q8H   mometasone-formoterol  2 puff Inhalation BID   nitroGLYCERIN  0.5 inch Topical  Q6H   potassium chloride  20 mEq Oral BID   venlafaxine XR  225 mg Oral Daily   Continuous Infusions:  heparin 900 Units/hr (03/02/22 0506)   PRN Meds: acetaminophen **OR** acetaminophen, cyclobenzaprine, hydrOXYzine, nitroGLYCERIN, ondansetron (ZOFRAN) IV, polyethylene glycol, sodium chloride HYPERTONIC  Allergies:   Allergies  Allergen Reactions   Ivp Dye [Iodinated Contrast Media] Anaphylaxis   Codeine Other (See Comments)    Dizziness  Dizziness   Cucumber Extract    Demeclocycline Other (See Comments)    Stomach upset    Sulfa Antibiotics Hives   Tegaderm Ag Mesh [Silver]    Telmisartan     dizziness   Tetracyclines & Related     Stomach upset     Social History:   Social History   Socioeconomic History   Marital status: Widowed    Spouse name: Not on file   Number of children: Not on file   Years of education: Not on file   Highest education level: Not on file  Occupational History   Not on file  Tobacco Use   Smoking status: Never   Smokeless tobacco: Never  Vaping Use   Vaping Use: Never used  Substance and Sexual Activity   Alcohol use: Yes    Alcohol/week: 4.0 standard drinks     Types: 4 Glasses of wine per week   Drug use: No   Sexual activity: Not on file  Other Topics Concern   Not on file  Social History Narrative   Not on file   Social Determinants of Health   Financial Resource Strain: Not on file  Food Insecurity: Not on file  Transportation Needs: Not on file  Physical Activity: Not on file  Stress: Not on file  Social Connections: Not on file  Intimate Partner Violence: Not on file     Family History:   Family History  Problem Relation Age of Onset   Pancreatic cancer Mother    Arthritis Mother    Arthritis Father    Bladder Cancer Father    Atrial fibrillation Sister     ROS:  Review of Systems  Constitutional:  Positive for malaise/fatigue. Negative for chills, diaphoresis, fever and weight loss.  HENT:  Negative for congestion.   Eyes:  Negative for discharge and redness.  Respiratory:  Positive for cough, shortness of breath and wheezing. Negative for sputum production.   Cardiovascular:  Positive for chest pain and leg swelling. Negative for palpitations, orthopnea, claudication and PND.  Gastrointestinal:  Negative for abdominal pain, heartburn, nausea and vomiting.  Musculoskeletal:  Negative for falls and myalgias.  Skin:  Negative for rash.  Neurological:  Negative for dizziness, tingling, tremors, sensory change, speech change, focal weakness, loss of consciousness and weakness.  Endo/Heme/Allergies:  Does not bruise/bleed easily.  Psychiatric/Behavioral:  Negative for substance abuse. The patient is not nervous/anxious.   All other systems reviewed and are negative.    Physical Exam/Data:   Vitals:   03/02/22 0400 03/02/22 0500 03/02/22 0600 03/02/22 0813  BP: (!) 155/85 (!) 165/96 (!) 149/82 (!) 143/97  Pulse: (!) 41 (!) 49 (!) 57 70  Resp: 20 18 (!) 21 20  Temp:    97.6 F (36.4 C)  TempSrc:    Oral  SpO2: 94% 95% 96% 95%  Weight:      Height:        Intake/Output Summary (Last 24 hours) at 03/02/2022 0838 Last  data filed at 03/02/2022 7681 Gross per 24 hour  Intake 80.8  ml  Output 3400 ml  Net -3319.2 ml   Filed Weights   03/01/22 1547  Weight: 59 kg   Body mass index is 23.78 kg/m.   Physical Exam: General: Well developed, well nourished, in no acute distress. Head: Normocephalic, atraumatic, sclera non-icteric, no xanthomas, nares without discharge.  Neck: Negative for carotid bruits. JVD not elevated. Lungs: Diminished breath sounds along the bases bilaterally. Breathing is unlabored. Heart: RRR with S1 S2. I/VI systolic murmur, no rubs, or gallops appreciated. Abdomen: Soft, non-tender, non-distended with normoactive bowel sounds. No hepatomegaly. No rebound/guarding. No obvious abdominal masses. Msk:  Strength and tone appear normal for age. Extremities: No clubbing or cyanosis. No edema. Distal pedal pulses are 2+ and equal bilaterally. Neuro: Alert and oriented X 3. No facial asymmetry. No focal deficit. Moves all extremities spontaneously. Psych:  Responds to questions appropriately with a normal affect.   EKG:  The EKG was personally reviewed and demonstrates: NSR, 70 bpm, bifascicular block, frequent PACs, inferior T wave inversion Telemetry:  Telemetry was personally reviewed and demonstrates: SR with PACs  Weights: Filed Weights   03/01/22 1547  Weight: 59 kg    Relevant CV Studies:  2D echo 11/24/2021: 1. Left ventricular ejection fraction, by estimation, is 55 to 60%. The  left ventricle has normal function. The left ventricle has no regional  wall motion abnormalities. The left ventricular internal cavity size was  mildly dilated. There is mild left  ventricular hypertrophy. Left ventricular diastolic parameters are  indeterminate.   2. Right ventricular systolic function is normal. The right ventricular  size is mildly enlarged.   3. Left atrial size was mildly dilated.   4. Right atrial size was mildly dilated.   5. The mitral valve is normal in structure.  Mild mitral valve  regurgitation.   6. The aortic valve is grossly normal. Aortic valve regurgitation is  mild.   7. The inferior vena cava is normal in size with greater than 50%  respiratory variability, suggesting right atrial pressure of 3 mmHg.   Comparison(s): LVEF 60-65%. __________  Elwyn Reach patch 10/2021: Normal sinus rhythm avg HR of 55 bpm.    5 Supraventricular Tachycardia/atrial tachycardia runs occurred, the run with  the fastest interval lasting 10 beats with a max rate of 143 bpm (avg 136 bpm); the run with the fastest interval was also the longest.    Isolated SVEs were frequent (12.9%, 62952), SVE Couplets were rare (<1.0%, 1059), and SVE Triplets were rare (<1.0%, 17). Isolated VEs were rare (<1.0%), and no VE Couplets or VE Triplets were present.   Patient triggered events associated with PACs __________  2D echo 01/03/2020: 1. Left ventricular ejection fraction, by estimation, is 60 to 65%. The  left ventricle has normal function. The left ventricle has no regional  wall motion abnormalities. Left ventricular diastolic parameters are  consistent with Grade II diastolic  dysfunction (pseudonormalization).   2. Right ventricular systolic function is normal. The right ventricular  size is normal.   3. The mitral valve is normal in structure. Mild mitral valve  regurgitation.   4. The aortic valve is normal in structure. Aortic valve regurgitation is  mild.   Laboratory Data:  Chemistry Recent Labs  Lab 03/01/22 1625 03/02/22 0352  NA 136 139  K 3.6 3.3*  CL 105 104  CO2 25 28  GLUCOSE 82 89  BUN 16 15  CREATININE 0.55 0.66  CALCIUM 8.5* 8.3*  GFRNONAA >60 >60  ANIONGAP 6 7  Recent Labs  Lab 03/01/22 1625  PROT 6.0*  ALBUMIN 3.6  AST 99*  ALT 101*  ALKPHOS 86  BILITOT 0.7   Hematology Recent Labs  Lab 03/01/22 1625 03/02/22 0352  WBC 5.6 5.4  RBC 4.08 3.92  HGB 11.6* 11.2*  HCT 36.8 34.8*  MCV 90.2 88.8  MCH 28.4 28.6  MCHC 31.5  32.2  RDW 14.6 14.6  PLT 246 227   Cardiac EnzymesNo results for input(s): TROPONINI in the last 168 hours. No results for input(s): TROPIPOC in the last 168 hours.  BNP Recent Labs  Lab 03/01/22 1625  BNP 1,740.8*    DDimer No results for input(s): DDIMER in the last 168 hours.  Radiology/Studies:  DG Chest 2 View  Result Date: 03/01/2022 IMPRESSION: Cardiomegaly with mild diffuse interstitial opacity and small pleural effusions, consistent with edema. No focal airspace opacity. Electronically Signed   By: Delanna Ahmadi M.D.   On: 03/01/2022 16:19    Assessment and Plan:   1.  NSTEMI: -Currently chest pain-free -High-sensitivity troponin 1044 -Aspirin -Heparin drip -Nitropaste -Echo -Plan for cardiac cath in the next 24 to 48 hours, following diuresis  2.  Acute on chronic HFpEF: -Volume status improving with IV diuresis, continue -Update echo to evaluate for new cardiomyopathy/wall motion abnormality -Continue bisoprolol, nitro, and hydralazine -Look to add SGLT2 inhibitor prior to discharge -Daily weights -Strict I's and O's  3.  History of right lower extremity DVT status post IVC filter: -Recent prolonged travel to [place] and back -Further management per primary service  4.  Transaminitis: -Cannot exclude congestive hepatopathy -Diuresis as above -Monitor  5.  HTN: -Blood pressure remains mildly elevated -Continue medical therapy as outlined above    For questions or updates, please contact [place] Please consult [email or web address] for contact info under Cardiology/STEMI.   Signed, Christell Faith, PA-C [place] Pager: [phone] 03/02/2022, 8:38 AM

## 2022-03-02 NOTE — Consult Note (Signed)
ANTICOAGULATION CONSULT NOTE - Initial Consult  Pharmacy Consult for heparin Indication: chest pain/ACS  Allergies  Allergen Reactions   Ivp Dye [Iodinated Contrast Media] Anaphylaxis   Codeine Other (See Comments)    Dizziness  Dizziness   Cucumber Extract    Demeclocycline Other (See Comments)    Stomach upset    Sulfa Antibiotics Hives   Tegaderm Ag Mesh [Silver]    Telmisartan     dizziness   Tetracyclines & Related     Stomach upset     Patient Measurements: Height: '5\' 2"'$  (157.5 cm) Weight: 59 kg (130 lb) IBW/kg (Calculated) : 50.1 Heparin Dosing Weight: 59 kg  Vital Signs: Temp: 97.8 F (36.6 C) (05/28 2230) Temp Source: Oral (05/28 2230) BP: 155/85 (05/29 0400) Pulse Rate: 41 (05/29 0400)  Labs: Recent Labs    03/01/22 1625 03/01/22 1825 03/01/22 2010 03/02/22 0352  HGB 11.6*  --   --  11.2*  HCT 36.8  --   --  34.8*  PLT 246  --   --  227  APTT  --   --  30  --   HEPARINUNFRC  --   --   --  0.18*  CREATININE 0.55  --   --  0.66  TROPONINIHS 1,009* 1,044*  --   --      Estimated Creatinine Clearance: 42.1 mL/min (by C-G formula based on SCr of 0.66 mg/dL).   Medical History: Past Medical History:  Diagnosis Date   Anemia    Atrial tachycardia (HCC)    Breast cancer (HCC)    Bronchiectasis ([place]) 06/2007   Chronic Dizziness    Colitis    COPD (chronic obstructive pulmonary disease) (HCC)    Depression    Diastolic dysfunction    a. 12/2019 Echo: EF 60-65%, no rwma, GrII DD. Nl RV size/fxn. Mild MR/AI.   DVT (deep venous thrombosis) ([place])    after her right knee surgery.    Fibromyalgia    Labile Hypertension    Migraine headache with aura    Osteoarthritis    Osteoporosis    PAD (peripheral artery disease) ([place])    Pneumonia    Polio 1952   Stenosis of carotid artery     Medications:  PTA: Received aspirin '325mg'$  x1 Inpatient: Allergies:   Assessment: 85yo femaile with hx for htn presents with SOB with intermittent chest pain  that started 3 weeks ago. Troponin elevated at 1009. Pharmacy consulted for heparin management in the setting of ACS.  Date Time aPTT/HL Rate/Comment  Goal of Therapy:  Heparin level 0.3-0.7 units/ml Monitor platelets by anticoagulation protocol: Yes   Plan:  5/29:  HL @ 0352 = 0.18, subtherapeutic  Will order heparin 1700 units IV X 1 bolus and increase drip rate to 900 units/hr.  Will recheck HL 8 hrs after rate change.   Nadea Kirkland D 03/02/2022,4:53 AM

## 2022-03-02 NOTE — Consult Note (Addendum)
   Heart Failure Nurse Navigator Note  HfpEF 55 to 60%.  Mild LVH.  Indeterminate diastolic dysfunction.  Mild biatrial enlargement.  Mild mitral regurgitation.  Mild aortic insufficiency.  This is by echocardiogram performed in February 2023.  Echocardiogram results are pending on this admission.  She presented to the emergency room with complaints of worsening shortness of breath for a few days, also noted that her weight was up 13 to 14 pounds, increasing lower extremity edema, abdominal distention and noted early satiety.  Also prior to admission she had noted 30 minutes of chest pain while visiting 3 weeks ago in [place].  Comorbidities:  Venous insufficiency DVT with IVC filter insertion Bronchiectasis Arthritis Hypertension COPD Anxiety/depression  Medications:  Potassium chloride 20 mEq twice a day Heparin infusion Hydralazine 10 mg every 8 hours Aspirin 81 mg Atorvastatin 40 mg daily Furosemide 40 mg IV every 12 Bisoprolol 5 mg daily  Labs:  Sodium 139, potassium 3.3, chloride 104, CO2 28, BUN 15, creatinine 0.66, troponin 1044, BNP 1740.  Total cholesterol 186, triglyceride 37, HDL 70, LDL 109. Weight is 59 kg Blood pressure 128/90  Initial meeting with patient while in the ED.  She is in no acute distress, O2 currently per nasal cannula.  Discussed heart failure and what it means discussed the different types of heart failure.  She lives at independent living at [place].  She prepares her own breakfast and lunch and eats supper in the dining room at [facility].  Talked about the importance of daily weights and recording.  She states that she has a scale at home and weighs daily. Went over weight increases to report along with changes signs and symptoms.  Discussed sodium/salt intake.  She states the only thing she salts is eggs and steak.  She is not 1 to eat in fast food restaurants.  Discussed fluid restriction of 64 ounces and its relationship to the salt  and sodium intake.  Dr. Tenny Craw with cardiology was in discussed heart failure and the reason for cardiac catheterization to be performed tomorrow.  Patient states that Dr. Mariah Milling was working on decreasing the amount of medications that she is taking.  Discussed with her the importance depending on what the results of her cardiac catheterization show tomorrow that she might be placed on more medications that are beneficial to her heart.  She voices understanding.  She was given the living with heart failure teaching booklet, zone magnet, weight chart, information on heart failure and low-sodium She had no further questions.  She has an appointment in the outpatient heart failure clinic on June 7 at 2 PM.  She has a 4% no-show which is 3 out of 74 appointments.    Will continue to follow.  Tresa Endo RN [facility]

## 2022-03-03 ENCOUNTER — Inpatient Hospital Stay: Admit: 2022-03-03 | Payer: Medicare Other

## 2022-03-03 ENCOUNTER — Encounter
Admission: EM | Disposition: A | Discharge status: Home / Self Care | Payer: Self-pay | Source: Ambulatory Visit | Attending: Internal Medicine

## 2022-03-03 DIAGNOSIS — I5023 Acute on chronic systolic (congestive) heart failure: Secondary | ICD-10-CM

## 2022-03-03 DIAGNOSIS — I5021 Acute systolic (congestive) heart failure: Secondary | ICD-10-CM | POA: Diagnosis not present

## 2022-03-03 DIAGNOSIS — I10 Essential (primary) hypertension: Secondary | ICD-10-CM | POA: Diagnosis not present

## 2022-03-03 DIAGNOSIS — I2510 Atherosclerotic heart disease of native coronary artery without angina pectoris: Secondary | ICD-10-CM

## 2022-03-03 DIAGNOSIS — I5043 Acute on chronic combined systolic (congestive) and diastolic (congestive) heart failure: Secondary | ICD-10-CM | POA: Insufficient documentation

## 2022-03-03 DIAGNOSIS — I214 Non-ST elevation (NSTEMI) myocardial infarction: Secondary | ICD-10-CM | POA: Diagnosis not present

## 2022-03-03 DIAGNOSIS — J479 Bronchiectasis, uncomplicated: Secondary | ICD-10-CM | POA: Diagnosis not present

## 2022-03-03 HISTORY — PX: RIGHT/LEFT HEART CATH AND CORONARY ANGIOGRAPHY: CATH118266

## 2022-03-03 LAB — COMPREHENSIVE METABOLIC PANEL
ALT: 76 U/L — ABNORMAL HIGH (ref 0–44)
AST: 67 U/L — ABNORMAL HIGH (ref 15–41)
Albumin: 3.6 g/dL (ref 3.5–5.0)
Alkaline Phosphatase: 81 U/L (ref 38–126)
Anion gap: 7 (ref 5–15)
BUN: 19 mg/dL (ref 8–23)
CO2: 28 mmol/L (ref 22–32)
Calcium: 8.6 mg/dL — ABNORMAL LOW (ref 8.9–10.3)
Chloride: 102 mmol/L (ref 98–111)
Creatinine, Ser: 0.58 mg/dL (ref 0.44–1.00)
GFR, Estimated: >60 mL/min (ref >60)
Glucose, Bld: 188 mg/dL — ABNORMAL HIGH (ref 70–99)
Potassium: 4 mmol/L (ref 3.5–5.1)
Sodium: 137 mmol/L (ref 135–145)
Total Bilirubin: 0.9 mg/dL (ref 0.3–1.2)
Total Protein: 6 g/dL — ABNORMAL LOW (ref 6.5–8.1)

## 2022-03-03 LAB — HEPARIN LEVEL (UNFRACTIONATED): Heparin Unfractionated: 0.4 IU/mL (ref 0.30–0.70)

## 2022-03-03 LAB — CBC
HCT: 37 % (ref 36.0–46.0)
Hemoglobin: 12.1 g/dL (ref 12.0–15.0)
MCH: 28.8 pg (ref 26.0–34.0)
MCHC: 32.7 g/dL (ref 30.0–36.0)
MCV: 88.1 fL (ref 80.0–100.0)
Platelets: 233 10*3/uL (ref 150–400)
RBC: 4.2 MIL/uL (ref 3.87–5.11)
RDW: 14.4 % (ref 11.5–15.5)
WBC: 4.7 10*3/uL (ref 4.0–10.5)
nRBC: 0 % (ref 0.0–0.2)

## 2022-03-03 SURGERY — RIGHT/LEFT HEART CATH AND CORONARY ANGIOGRAPHY
Anesthesia: Moderate Sedation

## 2022-03-03 MED ORDER — FENTANYL CITRATE (PF) 100 MCG/2ML IJ SOLN
INTRAMUSCULAR | Status: DC | PRN
  Administered 2022-03-03: 12.5 ug via INTRAVENOUS

## 2022-03-03 MED ORDER — VERAPAMIL HCL 2.5 MG/ML IV SOLN
INTRAVENOUS | Status: DC | PRN
  Administered 2022-03-03: 2.5 mg via INTRA_ARTERIAL

## 2022-03-03 MED ORDER — SODIUM CHLORIDE 0.9% FLUSH
3.0000 mL | Freq: Two times a day (BID) | INTRAVENOUS | Status: DC
  Administered 2022-03-03 – 2022-03-04 (×3): 3 mL via INTRAVENOUS

## 2022-03-03 MED ORDER — SODIUM CHLORIDE 0.9 % IV SOLN
250.0000 mL | INTRAVENOUS | Status: DC | PRN
  Administered 2022-03-03: 1000 mL via INTRAVENOUS

## 2022-03-03 MED ORDER — FENTANYL CITRATE (PF) 100 MCG/2ML IJ SOLN
INTRAMUSCULAR | Status: AC
  Filled 2022-03-03: qty 2

## 2022-03-03 MED ORDER — FAMOTIDINE 20 MG PO TABS
ORAL_TABLET | ORAL | Status: AC
  Administered 2022-03-03: 40 mg via ORAL
  Filled 2022-03-03: qty 2

## 2022-03-03 MED ORDER — MIDAZOLAM HCL 2 MG/2ML IJ SOLN
INTRAMUSCULAR | Status: DC | PRN
  Administered 2022-03-03: .5 mg via INTRAVENOUS

## 2022-03-03 MED ORDER — MIDAZOLAM HCL 2 MG/2ML IJ SOLN
INTRAMUSCULAR | Status: AC
  Filled 2022-03-03: qty 2

## 2022-03-03 MED ORDER — ENOXAPARIN SODIUM 40 MG/0.4ML IJ SOSY
40.0000 mg | PREFILLED_SYRINGE | INTRAMUSCULAR | Status: DC
  Administered 2022-03-04: 40 mg via SUBCUTANEOUS
  Filled 2022-03-03: qty 0.4

## 2022-03-03 MED ORDER — HEPARIN SODIUM (PORCINE) 1000 UNIT/ML IJ SOLN
INTRAMUSCULAR | Status: DC | PRN
  Administered 2022-03-03: 3000 [IU] via INTRAVENOUS

## 2022-03-03 MED ORDER — SODIUM CHLORIDE 0.9% FLUSH: 3.0000 mL | INTRAVENOUS | Status: DC | PRN

## 2022-03-03 MED ORDER — IOHEXOL 300 MG/ML  SOLN
INTRAMUSCULAR | Status: DC | PRN
  Administered 2022-03-03: 56 mL

## 2022-03-03 MED ORDER — FUROSEMIDE 10 MG/ML IJ SOLN
40.0000 mg | Freq: Two times a day (BID) | INTRAMUSCULAR | Status: DC
  Administered 2022-03-03 – 2022-03-04 (×2): 40 mg via INTRAVENOUS
  Filled 2022-03-03 (×2): qty 4

## 2022-03-03 MED ORDER — SODIUM CHLORIDE 0.9 % IV SOLN: INTRAVENOUS | Status: DC

## 2022-03-03 MED ORDER — MAGNESIUM SULFATE 2 GM/50ML IV SOLN
2.0000 g | Freq: Once | INTRAVENOUS | Status: AC
  Administered 2022-03-03: 2 g via INTRAVENOUS
  Filled 2022-03-03: qty 50

## 2022-03-03 MED ORDER — BUTALBITAL-APAP-CAFFEINE 50-325-40 MG PO TABS
1.0000 | ORAL_TABLET | Freq: Four times a day (QID) | ORAL | Status: DC | PRN
  Administered 2022-03-03: 1 via ORAL
  Filled 2022-03-03: qty 1

## 2022-03-03 MED ORDER — HEPARIN SODIUM (PORCINE) 1000 UNIT/ML IJ SOLN
INTRAMUSCULAR | Status: AC
  Filled 2022-03-03: qty 10

## 2022-03-03 MED ORDER — HEPARIN (PORCINE) IN NACL 1000-0.9 UT/500ML-% IV SOLN
INTRAVENOUS | Status: DC | PRN
  Administered 2022-03-03: 1000 mL

## 2022-03-03 MED ORDER — ACETAMINOPHEN 325 MG PO TABS
ORAL_TABLET | ORAL | Status: AC
  Filled 2022-03-03: qty 2

## 2022-03-03 MED ORDER — CLOPIDOGREL BISULFATE 75 MG PO TABS
75.0000 mg | ORAL_TABLET | Freq: Every day | ORAL | Status: DC
  Administered 2022-03-04: 75 mg via ORAL
  Filled 2022-03-03: qty 1

## 2022-03-03 MED ORDER — HYDRALAZINE HCL 20 MG/ML IJ SOLN: 10.0000 mg | INTRAMUSCULAR | Status: AC | PRN

## 2022-03-03 MED ORDER — SODIUM CHLORIDE 0.9% FLUSH
3.0000 mL | Freq: Two times a day (BID) | INTRAVENOUS | Status: DC
  Administered 2022-03-03 (×2): 3 mL via INTRAVENOUS

## 2022-03-03 MED ORDER — HEPARIN (PORCINE) IN NACL 1000-0.9 UT/500ML-% IV SOLN
INTRAVENOUS | Status: AC
  Filled 2022-03-03: qty 1000

## 2022-03-03 MED ORDER — LIDOCAINE HCL (PF) 1 % IJ SOLN
INTRAMUSCULAR | Status: DC | PRN
  Administered 2022-03-03 (×2): 2 mL

## 2022-03-03 MED ORDER — CLOPIDOGREL BISULFATE 75 MG PO TABS
300.0000 mg | ORAL_TABLET | Freq: Once | ORAL | Status: AC
  Administered 2022-03-03: 300 mg via ORAL
  Filled 2022-03-03: qty 4

## 2022-03-03 MED ORDER — SODIUM CHLORIDE 0.9 % IV SOLN: 250.0000 mL | INTRAVENOUS | Status: DC | PRN

## 2022-03-03 MED ORDER — VERAPAMIL HCL 2.5 MG/ML IV SOLN
INTRAVENOUS | Status: AC
  Filled 2022-03-03: qty 2

## 2022-03-03 MED ORDER — ASPIRIN 81 MG PO TBEC
DELAYED_RELEASE_TABLET | ORAL | Status: AC
  Filled 2022-03-03: qty 1

## 2022-03-03 MED ORDER — LABETALOL HCL 5 MG/ML IV SOLN: 10.0000 mg | INTRAVENOUS | Status: AC | PRN

## 2022-03-03 MED ORDER — LIDOCAINE HCL 1 % IJ SOLN
INTRAMUSCULAR | Status: AC
  Filled 2022-03-03: qty 20

## 2022-03-03 MED ORDER — SODIUM CHLORIDE 0.9 % IV SOLN
6.2500 mg | Freq: Once | INTRAVENOUS | Status: AC
  Administered 2022-03-03: 6.25 mg via INTRAVENOUS
  Filled 2022-03-03: qty 0.25

## 2022-03-03 MED ORDER — IPRATROPIUM-ALBUTEROL 0.5-2.5 (3) MG/3ML IN SOLN
3.0000 mL | Freq: Once | RESPIRATORY_TRACT | Status: AC
  Administered 2022-03-03: 3 mL via RESPIRATORY_TRACT
  Filled 2022-03-03: qty 3

## 2022-03-03 MED ORDER — EPINEPHRINE 1 MG/10ML IJ SOSY
PREFILLED_SYRINGE | INTRAMUSCULAR | Status: AC
  Filled 2022-03-03: qty 10

## 2022-03-03 SURGICAL SUPPLY — 14 items
CATH 5FR JL3.5 JR4 ANG PIG MP (CATHETERS) ×1 IMPLANT
CATH BALLN WEDGE 5F 110CM (CATHETERS) ×1 IMPLANT
DEVICE RAD TR BAND REGULAR (VASCULAR PRODUCTS) ×1 IMPLANT
DRAPE BRACHIAL (DRAPES) ×2 IMPLANT
GLIDESHEATH SLEND SS 6F .021 (SHEATH) ×1 IMPLANT
GUIDEWIRE .025 260CM (WIRE) ×1 IMPLANT
GUIDEWIRE INQWIRE 1.5J.035X260 (WIRE) IMPLANT
INQWIRE 1.5J .035X260CM (WIRE) ×2
PACK CARDIAC CATH (CUSTOM PROCEDURE TRAY) ×2 IMPLANT
PROTECTION STATION PRESSURIZED (MISCELLANEOUS) ×2
SET ATX SIMPLICITY (MISCELLANEOUS) ×1 IMPLANT
SHEATH GLIDE SLENDER 4/5FR (SHEATH) ×1 IMPLANT
STATION PROTECTION PRESSURIZED (MISCELLANEOUS) IMPLANT
WIRE HITORQ VERSACORE ST 145CM (WIRE) ×1 IMPLANT

## 2022-03-03 NOTE — Plan of Care (Signed)
  Problem: Education: Goal: Ability to demonstrate management of disease process will improve Outcome: Progressing Goal: Ability to verbalize understanding of medication therapies will improve Outcome: Progressing Goal: Individualized Educational Video(s) Outcome: Progressing   Problem: Activity: Goal: Capacity to carry out activities will improve Outcome: Progressing   

## 2022-03-03 NOTE — Consult Note (Signed)
ANTICOAGULATION CONSULT NOTE -   Pharmacy Consult for heparin Indication: chest pain/ACS  Allergies  Allergen Reactions   Ivp Dye [Iodinated Contrast Media] Anaphylaxis   Codeine Other (See Comments)    Dizziness  Dizziness   Cucumber Extract    Demeclocycline Other (See Comments)    Stomach upset    Sulfa Antibiotics Hives   Tegaderm Ag Mesh [Silver]    Telmisartan     dizziness   Tetracyclines & Related     Stomach upset     Patient Measurements: Height: '5\' 2"'$  (157.5 cm) Weight: 57.7 kg (127 lb 4.8 oz) IBW/kg (Calculated) : 50.1 Heparin Dosing Weight: 59 kg  Vital Signs: Temp: 97.8 F (36.6 C) (05/30 0741) Temp Source: Oral (05/30 0741) BP: 144/83 (05/30 0741) Pulse Rate: 77 (05/30 0741)  Labs: Recent Labs    03/01/22 1625 03/01/22 1625 03/01/22 1825 03/01/22 2010 03/02/22 0352 03/02/22 1357 03/02/22 2209 03/03/22 0517 03/03/22 0613  HGB 11.6*  --   --   --  11.2*  --   --  12.1  --   HCT 36.8  --   --   --  34.8*  --   --  37.0  --   PLT 246  --   --   --  227  --   --  233  --   APTT  --   --   --  30  --   --   --   --   --   HEPARINUNFRC  --    < >  --   --  0.18* 0.32 0.28*  --  0.40  CREATININE 0.55  --   --   --  0.66  --   --  0.58  --   TROPONINIHS 1,009*  --  1,044*  --   --   --   --   --   --    < > = values in this interval not displayed.     Estimated Creatinine Clearance: 42.1 mL/min (by C-G formula based on SCr of 0.58 mg/dL).   Medical History: Past Medical History:  Diagnosis Date   Anemia    Atrial tachycardia (HCC)    Breast cancer (HCC)    Bronchiectasis ([place]) 06/2007   Chronic Dizziness    Colitis    COPD (chronic obstructive pulmonary disease) (HCC)    Depression    Diastolic dysfunction    a. 12/2019 Echo: EF 60-65%, no rwma, GrII DD. Nl RV size/fxn. Mild MR/AI.   DVT (deep venous thrombosis) ([place])    after her right knee surgery.    Fibromyalgia    Labile Hypertension    Migraine headache with aura     Osteoarthritis    Osteoporosis    PAD (peripheral artery disease) ([place])    Pneumonia    Polio 1952   Stenosis of carotid artery     Medications:  PTA: Received aspirin '325mg'$  x1 Inpatient: Allergies:   Assessment: 85yo femaile with hx for htn presents with SOB with intermittent chest pain that started 3 weeks ago. Troponin elevated at 1009. Pharmacy consulted for heparin management in the setting of ACS.  Date Time aPTT/HL Rate/Comment 05/29 1357 0.32  Therapeutic 05/29 2209 0.28  Subtherapeutic 05/30 0613 0.40  Therapeutic x1  Goal of Therapy:  Heparin level 0.3-0.7 units/ml Monitor platelets by anticoagulation protocol: Yes   Plan:  Will continue heparin rate at 1050 units/hr.  Will recheck confirmatory HL in 8 hours. CBC daily  Pearla Dubonnet, PharmD Clinical Pharmacist 03/03/2022 8:57 AM

## 2022-03-03 NOTE — Interval H&P Note (Signed)
History and Physical Interval Note:  03/03/2022 12:36 PM  [place]  has presented today for surgery, with the diagnosis of non ST segment myocardial infarction.  The various methods of treatment have been discussed with the patient and family. After consideration of risks, benefits and other options for treatment, the patient has consented to  Procedure(s): RIGHT/LEFT HEART CATH AND CORONARY ANGIOGRAPHY (N/A) as a surgical intervention.  The patient's history has been reviewed, patient examined, no change in status, stable for surgery.  I have reviewed the patient's chart and labs.  Questions were answered to the patient's satisfaction.    Cath Lab Visit (complete for each Cath Lab visit)  Clinical Evaluation Leading to the Procedure:   ACS: Yes.    Non-ACS: N/A  Rissie Sculley

## 2022-03-03 NOTE — Progress Notes (Signed)
Progress Note  Patient Name: Lindsey [name] Date of Encounter: 03/03/2022  [facility] Cardiologist: Ida Rogue, MD   Subjective   Breathing improved.  No chest pain at this time.  Inpatient Medications    Scheduled Meds:  acetaminophen       ARIPiprazole  2 mg Oral Daily   aspirin EC       aspirin EC  81 mg Oral Daily   atorvastatin  40 mg Oral Daily   bisoprolol  5 mg Oral Daily   clopidogrel  300 mg Oral Once   [START ON 03/04/2022] clopidogrel  75 mg Oral Q breakfast   [START ON 03/04/2022] enoxaparin (LOVENOX) injection  40 mg Subcutaneous Q24H   furosemide  40 mg Intravenous BID   hydrALAZINE  10 mg Oral Q8H   mometasone-formoterol  2 puff Inhalation BID   potassium chloride  20 mEq Oral BID   sodium chloride flush  3 mL Intravenous Q12H   venlafaxine XR  225 mg Oral Daily   Continuous Infusions:  sodium chloride     PRN Meds: sodium chloride, acetaminophen **OR** acetaminophen, cyclobenzaprine, hydrALAZINE, hydrOXYzine, labetalol, nitroGLYCERIN, ondansetron (ZOFRAN) IV, polyethylene glycol, sodium chloride flush, sodium chloride HYPERTONIC   Vital Signs    Vitals:   03/03/22 1413 03/03/22 1415 03/03/22 1430 03/03/22 1500  BP: (!) 143/86 (!) 158/93 (!) 151/97   Pulse:  68 74 80  Resp:  17 (!) 25   Temp:      TempSrc:      SpO2:  91% 94%   Weight:      Height:        Intake/Output Summary (Last 24 hours) at 03/03/2022 1521 Last data filed at 03/03/2022 1013 Gross per 24 hour  Intake 609.29 ml  Output 925 ml  Net -315.71 ml      03/03/2022    9:27 AM 03/03/2022    6:20 AM 03/02/2022    2:57 PM  Last 3 Weights  Weight (lbs) 127 lb 4.8 oz 127 lb 4.8 oz 129 lb 1.6 oz  Weight (kg) 57.743 kg 57.743 kg 58.559 kg      Telemetry    Sinus rhythm with PVCs - Personally Reviewed  ECG    Normal sinus rhythm with bifascicular block (LAFB and RBBB) and inferolateral T wave inversions.  Mild QT prolongation. - Personally Reviewed  Physical Exam    GEN: No acute distress.   Neck: No JVD Cardiac: RRR, no murmurs, rubs, or gallops.  Respiratory: Clear to auscultation bilaterally. GI: Soft, nontender, non-distended  MS: No edema; No deformity. Neuro:  Nonfocal  Psych: Normal affect   Labs    High Sensitivity Troponin:   Recent Labs  Lab 03/01/22 1625 03/01/22 1825  TROPONINIHS 1,009* 1,044*     Chemistry Recent Labs  Lab 03/01/22 1625 03/02/22 0352 03/03/22 0517  NA 136 139 137  K 3.6 3.3* 4.0  CL 105 104 102  CO2 '25 28 28  '$ GLUCOSE 82 89 188*  BUN '16 15 19  '$ CREATININE 0.55 0.66 0.58  CALCIUM 8.5* 8.3* 8.6*  PROT 6.0*  --  6.0*  ALBUMIN 3.6  --  3.6  AST 99*  --  67*  ALT 101*  --  76*  ALKPHOS 86  --  81  BILITOT 0.7  --  0.9  GFRNONAA >60 >60 >60  ANIONGAP '6 7 7    '$ Lipids  Recent Labs  Lab 03/02/22 0352  CHOL 186  TRIG 37  HDL 70  LDLCALC 109*  CHOLHDL 2.7    Hematology Recent Labs  Lab 03/01/22 1625 03/02/22 0352 03/03/22 0517  WBC 5.6 5.4 4.7  RBC 4.08 3.92 4.20  HGB 11.6* 11.2* 12.1  HCT 36.8 34.8* 37.0  MCV 90.2 88.8 88.1  MCH 28.4 28.6 28.8  MCHC 31.5 32.2 32.7  RDW 14.6 14.6 14.4  PLT 246 227 233   Thyroid No results for input(s): TSH, FREET4 in the last 168 hours.  BNP Recent Labs  Lab 03/01/22 1625  BNP 1,740.8*    DDimer No results for input(s): DDIMER in the last 168 hours.   Radiology    DG Chest 2 View  Result Date: 03/01/2022 CLINICAL DATA:  Shortness of breath EXAM: CHEST - 2 VIEW COMPARISON:  10/15/2021 FINDINGS: Cardiomegaly. Mild, diffuse bilateral interstitial pulmonary opacity. Small bilateral pleural effusions. Disc degenerative disease of the thoracic spine. Vertebral cement augmentation of a midthoracic vertebral body. IMPRESSION: Cardiomegaly with mild diffuse interstitial opacity and small pleural effusions, consistent with edema. No focal airspace opacity. Electronically Signed   By: Delanna Ahmadi M.D.   On: 03/01/2022 16:19   CARDIAC  CATHETERIZATION  Result Date: 03/03/2022 Conclusions: Severe single vessel coronary artery disease with occlusion of mid LCx with left-to-left and right-to-left collaterals supplying distal OM branches.  Given patient's history of chest pain ~3 weeks ago following by progressive heart failure symptoms, I suspect her initial coronary event occurred at that time. Mild-moderate, non-obstructive CAD involving the LAD and RCA. Mildly-moderately elevated left heart filling pressures. Moderate pulmonary hypertension. Normal Fick cardiac output/index. Recommendations: Dual antiplatelet therapy with aspirin and clopidogrel for 12 months. Continue gentle diuresis. Escalate GDMT as tolerated. Aggressive secondary prevention of coronary artery disease.  No role for PCI to occluded LCx at this time, as event likely occurred ~3 weeks ago. Follow-up echocardiogram. Nelva Bush, MD [facility]   Cardiac Studies   See catheterization above.  Echocardiogram pending at this time.  Patient Profile     86 y.o. female with history of atrial tachycardia, right lower extremity DVT status post IVC filter, labile hypertension, and bronchiectasis, admitted with NSTEMI and acute heart failure.  Assessment & Plan    NSTEMI: Patient's initial chest pain event was 3 weeks ago, when she likely occluded the mid/distal LCx as seen on today's catheterization.  Her more recent symptoms likely represent progressive heart failure and possibly an element of demand ischemia. -Continue medical therapy.  No target for PCI in the setting of late presenting MI with collateralization of the distal LCx branches. -Loaded with clopidogrel 300 mg x 1 followed by 75 mg daily thereafter for up to 12 months. -Continue bisoprolol. -High intensity statin therapy. -Discontinue IV heparin and nitroglycerin paste.  If patient has recurrent angina, addition of long-acting nitrate could be considered.  Acute HFrEF due to ischemic  cardiomyopathy: LVEF moderately reduced by echo today with mildly-moderately elevated left heart filling pressures.  Echocardiogram is still pending. -Continue diuresis with furosemide 40 mg IV twice daily. -Continue bisoprolol 5 mg p.o. daily. -Consider rechallenging with ACE inhibitor or ARB tomorrow based on blood pressure and renal function (allergies indicate dizziness in the past with telmisartan). -Follow-up echocardiogram.  Migraine headaches: Patient has history of migraines, which she also complained of at the Rogue Rafalski of her heart catheterization.  She has previously used Maxalt for breakthrough headaches. -Continue acetaminophen.  Recommend avoiding medications such as Maxalt given propensity for coronary vasospasm and association with ischemic heart disease.  For questions or updates, please contact [facility] Please consult [email or web address] for contact info under [facility].    Signed, Nelva Bush, MD  03/03/2022, 3:21 PM

## 2022-03-03 NOTE — Brief Op Note (Signed)
BRIEF CARDIAC CATHETERIZATION NOTE  DATE: 03/03/2022  TIME: 1:53 PM  PATIENT:  Lindsey [name]  86 y.o. female  PRE-OPERATIVE DIAGNOSIS:  non ST segment myocardial infarction  POST-OPERATIVE DIAGNOSIS:  Same  PROCEDURE:  Procedure(s): RIGHT/LEFT HEART CATH AND CORONARY ANGIOGRAPHY (N/A)  SURGEON:  Surgeon(s) and Role:    * Wilmarie Sparlin, MD - Primary  FINDINGS: Severe single vessel CAD with occlusion of mid LCx.  Otherwise, non-obstructive CAD involving LAD and RCA. Moderately reduced LVEF with mid/apical inferior akinesis.  LVEF 35-45%. Mildly elevated left heart filling pressure. Normal Fick cardiac output/index.  RECOMMENDATIONS: Medical therapy and aggressive secondary prevention. Optimize GDMT as tolerated. Gentle diuresis. Follow-up echo.  Nelva Bush, MD [facility]

## 2022-03-03 NOTE — Assessment & Plan Note (Signed)
TTE as above.  Diuretic with IV Lasix.  Net -4.5 L.  Weight down from 130 pounds to 127 pounds. -Discharged on p.o. Lasix 40 mg daily -GDMT-bisoprolol, losartan and Lipitor -Counseled on sodium and fluid restriction as well as daily weight -Outpatient follow-up with cardiology

## 2022-03-03 NOTE — Progress Notes (Signed)
Progress Note   Patient: Lindsey [name] LKT:[id number] DOB: 1936-02-06 DOA: 03/01/2022     2 DOS: the patient was seen and examined on 03/03/2022   Brief hospital course: 86 year old female with past medical history of atrial tachycardia, anemia, breast cancer, COPD and bronchiectasis, polio.  She presented with shortness of breath.  She was admitted for congestive heart failure and also found to have an elevated troponin.  She stated she had chest pain in [place] about 3 weeks ago and had intermittent chest pain waking her up from sleep since then.  She was started on heparin drip.  Cardiac catheterization was done on 03/03/2022 which showed a severe single-vessel coronary artery disease occlusion of the mid circumflex.  The patient did have starting his own collaterals.  Moderate pulmonary hypertension.  The patient was started on Plavix and cardiology wanted to have aggressive medical management.  Assessment and Plan: * Acute on chronic systolic CHF (congestive heart failure) (HCC) EF on cardiac cath is 35 to 45%.  Awaiting echo.  Continue Lasix 40 mg IV twice daily.  On bisoprolol.  Has an allergy in the computer to telmisartan but cardiology may decide to rechallenge.  On hydralazine.  Non-ST elevation (NSTEMI) myocardial infarction (HCC) Troponin elevated at 1009 and 1044.  Since the patient had chest pain 3 weeks ago in [place] and on and off intermittently since then I will start heparin drip.  Continue bisoprolol, aspirin and statin.  LDL 109.  Cardiac catheterization showed a blockage of the left circumflex artery.  Plavix started.  Accelerated hypertension Blood pressure very elevated on presentation to the emergency room.  Continue Lasix IV, bisoprolol, hydralazine.  Cardiology may want to rechallenge on an ARB.  Diarrhea No diarrhea here.  Discontinued stool studies.  Elevated liver function tests AST and ALT elevated at 99 and 101 likely secondary to congestion from heart  failure/MI.  Repeat AST 67 and ALT 76.  Anxiety and depression Continue Abilify and Effexor  Bronchiectasis ([place]) Continue hypertonic nebulizer solution and Symbicort        Subjective: Patient seen this morning.  No complaints of chest discomfort.  Still having some shortness of breath and some cough.  Admitted with CHF and MI.  Physical Exam: Vitals:   03/03/22 1515 03/03/22 1530 03/03/22 1545 03/03/22 1600  BP: 122/64 125/64  131/90  Pulse: (!) 49 72  69  Resp: (!) '21 20 20 13  '$ Temp:      TempSrc:      SpO2: (!) 87% 90%  90%  Weight:      Height:       Physical Exam HENT:     Head: Normocephalic.     Mouth/Throat:     Pharynx: No oropharyngeal exudate.  Eyes:     General: Lids are normal.     Conjunctiva/sclera: Conjunctivae normal.  Cardiovascular:     Rate and Rhythm: Normal rate and regular rhythm.     Heart sounds: Normal heart sounds, S1 normal and S2 normal.  Pulmonary:     Breath sounds: Examination of the right-lower field reveals decreased breath sounds and rales. Examination of the left-lower field reveals decreased breath sounds and rales. Decreased breath sounds and rales present. No wheezing or rhonchi.  Abdominal:     Palpations: Abdomen is soft.     Tenderness: There is no abdominal tenderness.  Musculoskeletal:     Right lower leg: Swelling present.     Left lower leg: Swelling present.     Comments:  Swelling less than presentation.  Skin:    General: Skin is warm.     Findings: No rash.  Neurological:     Mental Status: She is alert and oriented to person, place, and time.    Data Reviewed: Cardiac cath showed severe single-vessel coronary artery disease with occlusion of the mid circumflex.  Family Communication: Updated patient's daughter on the phone  Disposition: Status is: Inpatient Remains inpatient appropriate because: Cardiology wants aggressive medical management and will reassess the patient tomorrow  Planned Discharge  Destination: Home   Author: Loletha Grayer, MD 03/03/2022 4:09 PM  For on call review [email or web address].

## 2022-03-03 NOTE — TOC Initial Note (Signed)
Transition of Care [facility]) - Initial/Assessment Note    Patient Details  Name: Lindsey [name] MRN: [id number] Date of Birth: July 29, 1936  Transition of Care [facility]) CM/SW Contact:    Laurena Slimmer, RN Phone Number: 03/03/2022, 10:31 AM  Clinical Narrative:                  Transition of Care [facility]) Screening Note   Patient Details  Name: Lindsey [name] Date of Birth: 1936-03-28   Transition of Care [facility]) CM/SW Contact:    Laurena Slimmer, RN Phone Number: 03/03/2022, 10:31 AM    Transition of Care Department (TOC) has reviewed patient and no TOC needs have been identified at this time. We will continue to monitor patient advancement through interdisciplinary progression rounds. If new patient transition needs arise, please place a TOC consult.          Patient Goals and CMS Choice        Expected Discharge Plan and Services                                                Prior Living Arrangements/Services                       Activities of Daily Living Home Assistive Devices/Equipment: None ADL Screening (condition at time of admission) Patient's cognitive ability adequate to safely complete daily activities?: Yes Is the patient deaf or have difficulty hearing?: No Does the patient have difficulty seeing, even when wearing glasses/contacts?: No Does the patient have difficulty concentrating, remembering, or making decisions?: No Patient able to express need for assistance with ADLs?: Yes Does the patient have difficulty dressing or bathing?: No Independently performs ADLs?: Yes (appropriate for developmental age) Does the patient have difficulty walking or climbing stairs?: No Weakness of Legs: None Weakness of Arms/Hands: None  Permission Sought/Granted                  Emotional Assessment              Admission diagnosis:  Shortness of breath [R06.02] CHF (congestive heart failure) ([place]) [I50.9] Peripheral edema  [R60.9] Hypoxia [R09.02] Elevated troponin [R77.8] Elevated brain natriuretic peptide (BNP) level [R79.89] Patient Active Problem List   Diagnosis Date Noted   Acute on chronic diastolic CHF (congestive heart failure) ([place]) 03/01/2022   Acute myocardial infarction ([place]) 03/01/2022   Elevated liver function tests 03/01/2022   Diarrhea 03/01/2022   S/P TKR (total knee replacement) using cement, bilateral 09/19/2020   TIA (transient ischemic attack) 01/03/2020   Migraine headache with aura 01/03/2020   Accelerated hypertension 01/03/2020   Anxiety and depression 01/03/2020   COPD with chronic bronchitis ([place]) 01/03/2020   Hypokalemia 01/03/2020   SOB (shortness of breath) 04/30/2014   Leg swelling 04/30/2014   Bronchiectasis ([place]) 04/30/2014   History of DVT (deep vein thrombosis) 04/30/2014   Labile blood pressure 04/30/2014   S/P IVC filter 04/30/2014   History of right knee surgery 04/30/2014   PCP:  Tracie Harrier, MD Pharmacy:   [place], [place] - [address] [place] [address] Phone: [phone] Fax: [phone]     Social Determinants of Health (SDOH) Interventions    Readmission Risk Interventions     View : No data to display.

## 2022-03-03 NOTE — Hospital Course (Signed)
86 year old female with past medical history of atrial tachycardia, anemia, breast cancer, COPD and bronchiectasis, polio.  She presented with shortness of breath.  She was admitted for congestive heart failure and also found to have an elevated troponin.  She stated she had chest pain in [place] about 3 weeks ago and had intermittent chest pain waking her up from sleep since then.  She was started on heparin drip.  Cardiac catheterization was done on 03/03/2022 which showed a severe single-vessel coronary artery disease occlusion of the mid circumflex.  The patient did have starting his own collaterals.  Moderate pulmonary hypertension.  The patient was started on Plavix and cardiology wanted to have aggressive medical management.

## 2022-03-04 ENCOUNTER — Inpatient Hospital Stay (HOSPITAL_COMMUNITY)
Admit: 2022-03-04 | Discharge: 2022-03-04 | Disposition: A | Discharge status: Home / Self Care | Payer: Medicare Other | Attending: Internal Medicine | Admitting: Internal Medicine

## 2022-03-04 ENCOUNTER — Encounter: Payer: Self-pay | Admitting: Internal Medicine

## 2022-03-04 DIAGNOSIS — I214 Non-ST elevation (NSTEMI) myocardial infarction: Secondary | ICD-10-CM | POA: Diagnosis not present

## 2022-03-04 DIAGNOSIS — I5043 Acute on chronic combined systolic (congestive) and diastolic (congestive) heart failure: Secondary | ICD-10-CM

## 2022-03-04 DIAGNOSIS — I5023 Acute on chronic systolic (congestive) heart failure: Secondary | ICD-10-CM | POA: Diagnosis not present

## 2022-03-04 LAB — CBC
HCT: 36.8 % (ref 36.0–46.0)
Hemoglobin: 11.8 g/dL — ABNORMAL LOW (ref 12.0–15.0)
MCH: 28.2 pg (ref 26.0–34.0)
MCHC: 32.1 g/dL (ref 30.0–36.0)
MCV: 87.8 fL (ref 80.0–100.0)
Platelets: 252 10*3/uL (ref 150–400)
RBC: 4.19 MIL/uL (ref 3.87–5.11)
RDW: 14.7 % (ref 11.5–15.5)
WBC: 6.9 10*3/uL (ref 4.0–10.5)
nRBC: 0 % (ref 0.0–0.2)

## 2022-03-04 LAB — ECHOCARDIOGRAM COMPLETE
AR max vel: 2.15 cm2
AV Area VTI: 2.2 cm2
AV Area mean vel: 2.25 cm2
AV Mean grad: 4 mmHg
AV Peak grad: 8.3 mmHg
Ao pk vel: 1.44 m/s
Area-P 1/2: 3.94 cm2
Height: 62 in
MV VTI: 1.82 cm2
S' Lateral: 3.64 cm
Weight: 2036.8 oz

## 2022-03-04 LAB — BASIC METABOLIC PANEL
Anion gap: 7 (ref 5–15)
BUN: 23 mg/dL (ref 8–23)
CO2: 30 mmol/L (ref 22–32)
Calcium: 8.5 mg/dL — ABNORMAL LOW (ref 8.9–10.3)
Chloride: 99 mmol/L (ref 98–111)
Creatinine, Ser: 0.67 mg/dL (ref 0.44–1.00)
GFR, Estimated: >60 mL/min (ref >60)
Glucose, Bld: 105 mg/dL — ABNORMAL HIGH (ref 70–99)
Potassium: 3.9 mmol/L (ref 3.5–5.1)
Sodium: 136 mmol/L (ref 135–145)

## 2022-03-04 MED ORDER — LOSARTAN POTASSIUM 25 MG PO TABS
25.0000 mg | ORAL_TABLET | Freq: Every day | ORAL | Status: DC
  Administered 2022-03-04: 25 mg via ORAL
  Filled 2022-03-04: qty 1

## 2022-03-04 MED ORDER — PERFLUTREN LIPID MICROSPHERE
1.0000 mL | INTRAVENOUS | Status: AC | PRN
  Administered 2022-03-04: 2 mL via INTRAVENOUS

## 2022-03-04 MED ORDER — FUROSEMIDE 40 MG PO TABS: 40.0000 mg | ORAL_TABLET | Freq: Every day | ORAL | Status: DC

## 2022-03-04 MED ORDER — FUROSEMIDE 40 MG PO TABS: 40.0000 mg | ORAL_TABLET | Freq: Every day | ORAL | 1 refills | Status: DC

## 2022-03-04 MED ORDER — POTASSIUM CHLORIDE CRYS ER 20 MEQ PO TBCR: 20.0000 meq | EXTENDED_RELEASE_TABLET | Freq: Two times a day (BID) | ORAL | 1 refills | Status: DC

## 2022-03-04 MED ORDER — ATORVASTATIN CALCIUM 40 MG PO TABS: 40.0000 mg | ORAL_TABLET | Freq: Every day | ORAL | 1 refills | Status: DC

## 2022-03-04 MED ORDER — CLOPIDOGREL BISULFATE 75 MG PO TABS: 75.0000 mg | ORAL_TABLET | Freq: Every day | ORAL | 1 refills | Status: DC

## 2022-03-04 MED ORDER — ASPIRIN 81 MG PO TBEC: 81.0000 mg | DELAYED_RELEASE_TABLET | Freq: Every day | ORAL | 12 refills | Status: DC

## 2022-03-04 MED ORDER — LOSARTAN POTASSIUM 25 MG PO TABS: 25.0000 mg | ORAL_TABLET | Freq: Every day | ORAL | 1 refills | Status: DC

## 2022-03-04 NOTE — Progress Notes (Signed)
*  PRELIMINARY RESULTS* Echocardiogram 2D Echocardiogram has been performed.  Lindsey [name] 03/04/2022, 9:31 AM

## 2022-03-04 NOTE — Care Management Important Message (Signed)
Important Message  Patient Details  Name: Lindsey [name] MRN: [id number] Date of Birth: 1936/02/21   Medicare Important Message Given:  Yes     Dannette Barbara 03/04/2022, 11:00 AM

## 2022-03-04 NOTE — Progress Notes (Signed)
Pt denies any need and now question on discharge instructions. Denies any needs or Pain. VSS.

## 2022-03-04 NOTE — Discharge Summary (Signed)
Physician Discharge Summary  [name] WCB:[id number] DOB: [birth year] DOA: 03/01/2022  PCP: Tracie Harrier, MD  Admit date: 03/01/2022 Discharge date: 03/04/2022 Admitted From: Home Disposition: Home Recommendations for Outpatient Follow-up:  Follow ups as below. Please obtain CBC/CMP at follow up Please follow up on the following pending results: None   Home Health: Not indicated Equipment/Devices: Not indicated  Discharge Condition: Stable CODE STATUS: Full code  Follow-up Information     Tracie Harrier, MD. Schedule an appointment as soon as possible for a visit in 1 week(s).   Specialty: Internal Medicine Why: appointment on 03/10/22 @ 1.45pm  Dr. Marianna Payment information: [facility]- Internal Medicine [name] [id number] [facility] course 86 year old F with PMH of systolic CHF, atrial tachycardia, COPD, breast cancer, bronchiectasis, polyp, anxiety and depression presenting with shortness of breath, weight gain, edema, intermittent chest pain, and admitted for acute on chronic systolic CHF and NSTEMI.  High-sensitivity troponin 1009>> 1044.  BNP elevated to 1740.  She also had mildly elevated LFT.  CXR cardiomegaly with mild diffuse interstitial opacity and small pleural effusions, consistent with edema.  Twelve-lead EKG sinus rhythm with PACs, LAFB, RBBB and TWI in inferolateral leads.  Patient was started on IV heparin and IV Lasix.  She underwent R/LHC that showed severe single-vessel CAD with occlusion of mid LCx with left to left and right to left collaterals.  Cardiology recommended medical management with DAPT and escalation of GDMT.  TTE with LVEF of 40 to 45%, RWMA and G3-DD.   On the day of discharge, patient's symptoms resolved.  She had net -4.5 L through her hospitalization.  Weight is down from 130 pounds to 127 pounds.  She was cleared for discharge by cardiology for outpatient  follow-up.  Medications adjusted per recommendation by cardiology.   See individual problem list below for more on hospital course.  Problems addressed during this hospitalization Problem  Acute on chronic combined CHF  Non-St Elevation (Nstemi) Myocardial Infarction (Hcc)  Uncontrolled Hypertension  Elevated Liver Function Tests  Anxiety and Depression  Bronchiectasis (HCC)  Diarrhea (Resolved)    Assessment and Plan: * Acute on chronic combined CHF TTE as above.  Diuretic with IV Lasix.  Net -4.5 L.  Weight down from 130 pounds to 127 pounds. -Discharged on p.o. Lasix 40 mg daily -GDMT-bisoprolol, losartan and Lipitor -Counseled on sodium and fluid restriction as well as daily weight -Outpatient follow-up with cardiology  Non-ST elevation (NSTEMI) myocardial infarction ([place]) Troponin elevated at 1009 and 1044.  EKG sinus rhythm with PACs, T WI in inferolateral leads and chronic LAFB and RBBB.  R/LHC with severe single-vessel CAD with occlusion of mid LCx with collaterals and moderate pulmonary hypertension.  LDL 109.  -Discharged on bisoprolol, losartan, Lipitor, Plavix and aspirin.  Uncontrolled hypertension Improved and normotensive. -Cardiac meds as above  Diarrhea-resolved as of 03/04/2022 Resolved.  Elevated liver function tests Likely congestive hepatopathy.  Improved. -Recheck CMP at follow-up  Anxiety and depression Stable.  Continue Abilify and Effexor  Bronchiectasis (HCC) Stable.                 Vital signs Vitals:   03/04/22 0833 03/04/22 1000 03/04/22 1151 03/04/22 1254  BP: 136/79  133/78 137/84  Pulse: (!) 49  (!) 48 (!) 55  Temp:   98.1 F (36.7 C) 98 F (36.7 C)  Resp:  20 19 16  Height:      Weight:      SpO2: 95%  93% 97%  TempSrc:   Oral   BMI (Calculated):         Discharge exam  GENERAL: No apparent distress.  Nontoxic. HEENT: MMM.  Vision and hearing grossly intact.  NECK: Supple.  No apparent JVD.  RESP:  No IWOB.   Fair aeration bilaterally. CVS:  RRR. Heart sounds normal.  ABD/GI/GU: BS+. Abd soft, NTND.  MSK/EXT:  Moves extremities. No apparent deformity. No edema.  SKIN: no apparent skin lesion or wound NEURO: Awake and alert. Oriented appropriately.  No apparent focal neuro deficit. PSYCH: Calm. Normal affect.   Discharge Instructions Discharge Instructions     AMB Referral to Cardiac Rehabilitation - Phase II   Complete by: As directed    Diagnosis:  Coronary Stents NSTEMI     After initial evaluation and assessments completed: Virtual Based Care may be provided alone or in conjunction with Phase 2 Cardiac Rehab based on patient barriers.: Yes   Call MD for:  difficulty breathing, headache or visual disturbances   Complete by: As directed    Call MD for:  extreme fatigue   Complete by: As directed    Call MD for:  persistant dizziness or light-headedness   Complete by: As directed    Call MD for:  severe uncontrolled pain   Complete by: As directed    Diet - low sodium heart healthy   Complete by: As directed    Discharge instructions   Complete by: As directed    It has been a pleasure taking care of you!  You were hospitalized due to heart failure exacerbation and heart attack.  We have started you on medications for both conditions.  Please review your new medication list and the directions on your medications before you take them.  It is very important that you take those medications as prescribed.  Avoid any over-the-counter pain medication other than plain Tylenol.  Follow-up with your primary care doctor in 1 to 2 weeks.  Follow-up with your cardiologist per their recommendation.  In addition to taking your medications as prescribed, we also recommend you avoid alcohol or over-the-counter pain medication other than plain Tylenol, limit the amount of water/fluid you drink to less than 6 cups (1500 cc) a day,  limit your sodium (salt) intake to less than 2 g (2000 mg) a day and weigh  yourself daily at the same time and keeping your weight log.     Take care,   Increase activity slowly   Complete by: As directed       Allergies as of 03/04/2022       Reactions   Ivp Dye [iodinated Contrast Media] Anaphylaxis   Codeine Other (See Comments)   Dizziness  Dizziness   Cucumber Extract    Demeclocycline Other (See Comments)   Stomach upset    Sulfa Antibiotics Hives   Tegaderm Ag Mesh [silver]    Telmisartan    dizziness   Tetracyclines & Related    Stomach upset         Medication List     STOP taking these medications    Aspirin 81 MG Caps Replaced by: aspirin EC 81 MG tablet   doxazosin 1 MG tablet Commonly known as: CARDURA   hydrALAZINE 25 MG tablet Commonly known as: APRESOLINE       TAKE these medications    ARIPiprazole 2 MG tablet Commonly known as: ABILIFY Take  2 mg by mouth daily.   aspirin EC 81 MG tablet Take 1 tablet (81 mg total) by mouth daily. Swallow whole. Start taking on: March 05, 2022 Replaces: Aspirin 81 MG Caps   atorvastatin 40 MG tablet Commonly known as: LIPITOR Take 1 tablet (40 mg total) by mouth daily. Start taking on: March 05, 2022   bisoprolol 5 MG tablet Commonly known as: ZEBETA Take 1 tablet (5 mg total) by mouth daily.   clopidogrel 75 MG tablet Commonly known as: PLAVIX Take 1 tablet (75 mg total) by mouth daily with breakfast. Start taking on: March 05, 2022   cyclobenzaprine 5 MG tablet Commonly known as: FLEXERIL Take 5 mg by mouth at bedtime as needed.   Emgality 120 MG/ML Soaj Generic drug: Galcanezumab-gnlm Inject 120 mg into the skin every 30 (thirty) days.   furosemide 40 MG tablet Commonly known as: LASIX Take 1 tablet (40 mg total) by mouth daily. Start taking on: March 05, 2022 What changed:  medication strength how much to take   hydrOXYzine 25 MG tablet Commonly known as: ATARAX Take 25 mg by mouth as needed for anxiety.   losartan 25 MG tablet Commonly known as:  COZAAR Take 1 tablet (25 mg total) by mouth daily. Start taking on: March 05, 2022   polyethylene glycol 17 g packet Commonly known as: MIRALAX / GLYCOLAX Take 17 g by mouth daily as needed for mild constipation or moderate constipation.   potassium chloride SA 20 MEQ tablet Commonly known as: KLOR-CON M Take 1 tablet (20 mEq total) by mouth 2 (two) times daily. What changed:  medication strength how much to take when to take this   rizatriptan 5 MG tablet Commonly known as: MAXALT Take 5 mg by mouth as needed for migraine.   sodium chloride HYPERTONIC 3 % nebulizer solution Take 5 mLs by nebulization every 4 (four) hours as needed.   Symbicort 160-4.5 MCG/ACT inhaler Generic drug: budesonide-formoterol SMARTSIG:2 Inhalation Via Inhaler Twice Daily   venlafaxine XR 150 MG 24 hr capsule Commonly known as: EFFEXOR-XR Take 150 mg by mouth daily. What changed: Another medication with the same name was removed. Continue taking this medication, and follow the directions you see here.        Consultations: Cardiology  Procedures/Studies: R/LHC on 03/03/2022 Conclusions: Severe single vessel coronary artery disease with occlusion of mid LCx with left-to-left and right-to-left collaterals supplying distal OM branches.  Given patient's history of chest pain ~3 weeks ago following by progressive heart failure symptoms, I suspect her initial coronary event occurred at that time. Mild-moderate, non-obstructive CAD involving the LAD and RCA. Mildly-moderately elevated left heart filling pressures. Moderate pulmonary hypertension. Normal Fick cardiac output/index.   Recommendations: Dual antiplatelet therapy with aspirin and clopidogrel for 12 months. Continue gentle diuresis. Escalate GDMT as tolerated. Aggressive secondary prevention of coronary artery disease.  No role for PCI to occluded LCx at this time, as event likely occurred ~3 weeks ago. Follow-up echocardiogram.    Nelva Bush, MD [facility]  DG Chest 2 View  Result Date: 03/01/2022 CLINICAL DATA:  Shortness of breath EXAM: CHEST - 2 VIEW COMPARISON:  10/15/2021 FINDINGS: Cardiomegaly. Mild, diffuse bilateral interstitial pulmonary opacity. Small bilateral pleural effusions. Disc degenerative disease of the thoracic spine. Vertebral cement augmentation of a midthoracic vertebral body. IMPRESSION: Cardiomegaly with mild diffuse interstitial opacity and small pleural effusions, consistent with edema. No focal airspace opacity. Electronically Signed   By: Delanna Ahmadi M.D.   On: 03/01/2022 16:19  CARDIAC CATHETERIZATION  Result Date: 03/03/2022 Conclusions: Severe single vessel coronary artery disease with occlusion of mid LCx with left-to-left and right-to-left collaterals supplying distal OM branches.  Given patient's history of chest pain ~3 weeks ago following by progressive heart failure symptoms, I suspect her initial coronary event occurred at that time. Mild-moderate, non-obstructive CAD involving the LAD and RCA. Mildly-moderately elevated left heart filling pressures. Moderate pulmonary hypertension. Normal Fick cardiac output/index. Recommendations: Dual antiplatelet therapy with aspirin and clopidogrel for 12 months. Continue gentle diuresis. Escalate GDMT as tolerated. Aggressive secondary prevention of coronary artery disease.  No role for PCI to occluded LCx at this time, as event likely occurred ~3 weeks ago. Follow-up echocardiogram. Nelva Bush, MD [facility]  ECHOCARDIOGRAM COMPLETE  Result Date: 03/04/2022    ECHOCARDIOGRAM REPORT   Patient Name:   Lindsey [name] Date of Exam: 03/04/2022 Medical Rec #:  [id number]        Height:       62.0 in Accession #:    [id number]       Weight:       127.3 lb Date of Birth:  01/14/1936       BSA:          1.578 m Patient Age:    86 years         BP:           136/79 mmHg Patient Gender: F                HR:           56 bpm. Exam  Location:  [facility] Procedure: 2D Echo, Color Doppler, Cardiac Doppler and Intracardiac            Opacification Agent Indications:     I21.4 NSTEMI  History:         Patient has prior history of Echocardiogram examinations, most                  recent 11/24/2021. CHF, Previous Myocardial Infarction, COPD and                  PAD; Risk Factors:Hypertension.  Sonographer:     Charmayne Sheer Referring Phys:  [phone] CHRISTOPHER END Diagnosing Phys: Kate Sable MD  Sonographer Comments: Image acquisition challenging due to breast implants. IMPRESSIONS  1. Left ventricular ejection fraction, by estimation, is 40 to 45%. The left ventricle has mildly decreased function. The left ventricle demonstrates regional wall motion abnormalities (see scoring diagram/findings for description). There is mild left ventricular hypertrophy. Left ventricular diastolic parameters are consistent with Grade III diastolic dysfunction (restrictive). There is moderate hypokinesis of the left ventricular, basal-mid inferolateral wall.  2. Right ventricular systolic function is low normal. The right ventricular size is normal.  3. The mitral valve was not well visualized. Mild mitral valve regurgitation.  4. The aortic valve was not well visualized. Aortic valve regurgitation is mild.  5. The inferior vena cava is dilated in size with <50% respiratory variability, suggesting right atrial pressure of 15 mmHg. FINDINGS  Left Ventricle: Left ventricular ejection fraction, by estimation, is 40 to 45%. The left ventricle has mildly decreased function. The left ventricle demonstrates regional wall motion abnormalities. Moderate hypokinesis of the left ventricular, basal-mid inferolateral wall. Definity contrast agent was given IV to delineate the left ventricular endocardial borders. The left ventricular internal cavity size was normal in size. There is mild left ventricular hypertrophy. Left ventricular diastolic  parameters are consistent with Grade  III  diastolic dysfunction (restrictive). Right Ventricle: The right ventricular size is normal. No increase in right ventricular wall thickness. Right ventricular systolic function is low normal. Left Atrium: Left atrial size was not well visualized. Right Atrium: Right atrial size was normal in size. Pericardium: There is no evidence of pericardial effusion. Mitral Valve: The mitral valve was not well visualized. Mild mitral valve regurgitation. MV peak gradient, 3.4 mmHg. The mean mitral valve gradient is 1.0 mmHg. Tricuspid Valve: The tricuspid valve is normal in structure. Tricuspid valve regurgitation is mild. Aortic Valve: The aortic valve was not well visualized. Aortic valve regurgitation is mild. Aortic valve mean gradient measures 4.0 mmHg. Aortic valve peak gradient measures 8.3 mmHg. Aortic valve area, by VTI measures 2.20 cm. Pulmonic Valve: The pulmonic valve was not well visualized. Pulmonic valve regurgitation is not visualized. Aorta: The aortic root is normal in size and structure. Venous: The inferior vena cava is dilated in size with less than 50% respiratory variability, suggesting right atrial pressure of 15 mmHg. IAS/Shunts: No atrial level shunt detected by color flow Doppler.  LEFT VENTRICLE PLAX 2D LVIDd:         5.28 cm   Diastology LVIDs:         3.64 cm   LV e' medial:    4.79 cm/s LV PW:         1.08 cm   LV E/e' medial:  18.0 LV IVS:        0.76 cm   LV e' lateral:   3.92 cm/s LVOT diam:     2.10 cm   LV E/e' lateral: 22.0 LV SV:         67 LV SV Index:   43 LVOT Area:     3.46 cm  RIGHT VENTRICLE RV Basal diam:  3.52 cm RV S prime:     8.81 cm/s LEFT ATRIUM           Index        RIGHT ATRIUM           Index LA diam:      2.60 cm 1.65 cm/m   RA Area:     13.70 cm LA Vol (A4C): 43.3 ml 27.44 ml/m  RA Volume:   31.30 ml  19.84 ml/m  AORTIC VALVE                    PULMONIC VALVE AV Area (Vmax):    2.15 cm     PV Vmax:       0.51 m/s AV Area (Vmean):   2.25 cm     PV Vmean:       34.400 cm/s AV Area (VTI):     2.20 cm     PV VTI:        0.087 m AV Vmax:           144.00 cm/s  PV Peak grad:  1.0 mmHg AV Vmean:          93.800 cm/s  PV Mean grad:  1.0 mmHg AV VTI:            0.305 m AV Peak Grad:      8.3 mmHg AV Mean Grad:      4.0 mmHg LVOT Vmax:         89.40 cm/s LVOT Vmean:        60.900 cm/s LVOT VTI:          0.194 m LVOT/AV VTI ratio: 0.64  AORTA Ao Root diam: 3.20 cm MITRAL VALVE MV Area (PHT): 3.94 cm    SHUNTS MV Area VTI:   1.82 cm    Systemic VTI:  0.19 m MV Peak grad:  3.4 mmHg    Systemic Diam: 2.10 cm MV Mean grad:  1.0 mmHg MV Vmax:       0.92 m/s MV Vmean:      41.7 cm/s MV Decel Time: 193 msec MV E velocity: 86.10 cm/s MV A velocity: 39.60 cm/s MV E/A ratio:  2.17 Kate Sable MD Electronically signed by Kate Sable MD Signature Date/Time: 03/04/2022/1:06:40 PM    Final (Updated)        The results of significant diagnostics from this hospitalization (including imaging, microbiology, ancillary and laboratory) are listed below for reference.     Microbiology: Recent Results (from the past 240 hour(s))  Resp Panel by RT-PCR (Flu A&B, Covid) Anterior Nasal Swab     Status: None   Collection Time: 03/01/22  4:25 PM   Specimen: Anterior Nasal Swab  Result Value Ref Range Status   SARS Coronavirus 2 by RT PCR NEGATIVE NEGATIVE Final    Comment: (NOTE) SARS-CoV-2 target nucleic acids are NOT DETECTED.  The SARS-CoV-2 RNA is generally detectable in upper respiratory specimens during the acute phase of infection. The lowest concentration of SARS-CoV-2 viral copies this assay can detect is 138 copies/mL. A negative result does not preclude SARS-Cov-2 infection and should not be used as the sole basis for treatment or other patient management decisions. A negative result may occur with  improper specimen collection/handling, submission of specimen other than nasopharyngeal swab, presence of viral mutation(s) within the areas targeted by this  assay, and inadequate number of viral copies(<138 copies/mL). A negative result must be combined with clinical observations, patient history, and epidemiological information. The expected result is Negative.  Fact Sheet for Patients:  EntrepreneurPulse.com.au  Fact Sheet for Healthcare Providers:  IncredibleEmployment.be  This test is no t yet approved or cleared by the [place] FDA and  has been authorized for detection and/or diagnosis of SARS-CoV-2 by FDA under an Emergency Use Authorization (EUA). This EUA will remain  in effect (meaning this test can be used) for the duration of the COVID-19 declaration under Section 564(b)(1) of the Act, 21 U.S.C.section 360bbb-3(b)(1), unless the authorization is terminated  or revoked sooner.       Influenza A by PCR NEGATIVE NEGATIVE Final   Influenza B by PCR NEGATIVE NEGATIVE Final    Comment: (NOTE) The Xpert Xpress SARS-CoV-2/FLU/RSV plus assay is intended as an aid in the diagnosis of influenza from Nasopharyngeal swab specimens and should not be used as a sole basis for treatment. Nasal washings and aspirates are unacceptable for Xpert Xpress SARS-CoV-2/FLU/RSV testing.  Fact Sheet for Patients: EntrepreneurPulse.com.au  Fact Sheet for Healthcare Providers: IncredibleEmployment.be  This test is not yet approved or cleared by the [place] FDA and has been authorized for detection and/or diagnosis of SARS-CoV-2 by FDA under an Emergency Use Authorization (EUA). This EUA will remain in effect (meaning this test can be used) for the duration of the COVID-19 declaration under Section 564(b)(1) of the Act, 21 U.S.C. section 360bbb-3(b)(1), unless the authorization is terminated or revoked.  Performed at [facility] [address] [place], [place] [address]      Labs:  CBC: Recent Labs  Lab 03/01/22 1625 03/02/22 0352  03/03/22 0517 03/04/22 0607  WBC 5.6 5.4 4.7 6.9  NEUTROABS 3.6  --   --   --  HGB 11.6* 11.2* 12.1 11.8*  HCT 36.8 34.8* 37.0 36.8  MCV 90.2 88.8 88.1 87.8  PLT 246 227 233 252   BMP &GFR Recent Labs  Lab 03/01/22 1625 03/02/22 0352 03/03/22 0517 03/04/22 0607  NA 136 139 137 136  K 3.6 3.3* 4.0 3.9  CL 105 104 102 99  CO2 '25 28 28 30  '$ GLUCOSE 82 89 188* 105*  BUN '16 15 19 23  '$ CREATININE 0.55 0.66 0.58 0.67  CALCIUM 8.5* 8.3* 8.6* 8.5*   Estimated Creatinine Clearance: 42.1 mL/min (by C-G formula based on SCr of 0.67 mg/dL). Liver & Pancreas: Recent Labs  Lab 03/01/22 1625 03/03/22 0517  AST 99* 67*  ALT 101* 76*  ALKPHOS 86 81  BILITOT 0.7 0.9  PROT 6.0* 6.0*  ALBUMIN 3.6 3.6   No results for input(s): LIPASE, AMYLASE in the last 168 hours. No results for input(s): AMMONIA in the last 168 hours. Diabetic: No results for input(s): HGBA1C in the last 72 hours. No results for input(s): GLUCAP in the last 168 hours. Cardiac Enzymes: No results for input(s): CKTOTAL, CKMB, CKMBINDEX, TROPONINI in the last 168 hours. No results for input(s): PROBNP in the last 8760 hours. Coagulation Profile: No results for input(s): INR, PROTIME in the last 168 hours. Thyroid Function Tests: No results for input(s): TSH, T4TOTAL, FREET4, T3FREE, THYROIDAB in the last 72 hours. Lipid Profile: Recent Labs    03/02/22 0352  CHOL 186  HDL 70  LDLCALC 109*  TRIG 37  CHOLHDL 2.7   Anemia Panel: No results for input(s): VITAMINB12, FOLATE, FERRITIN, TIBC, IRON, RETICCTPCT in the last 72 hours. Urine analysis:    Component Value Date/Time   COLORURINE YELLOW (A) 10/15/2021 1525   APPEARANCEUR CLEAR (A) 10/15/2021 1525   LABSPEC 1.010 10/15/2021 1525   PHURINE 7.0 10/15/2021 1525   GLUCOSEU NEGATIVE 10/15/2021 1525   HGBUR NEGATIVE 10/15/2021 1525   BILIRUBINUR NEGATIVE 10/15/2021 1525   KETONESUR NEGATIVE 10/15/2021 1525   PROTEINUR NEGATIVE 10/15/2021 1525   NITRITE  NEGATIVE 10/15/2021 1525   LEUKOCYTESUR TRACE (A) 10/15/2021 1525   Sepsis Labs: Invalid input(s): PROCALCITONIN, LACTICIDVEN   Time coordinating discharge: 45 minutes  SIGNED:  Mercy Riding, MD  [facility] 03/04/2022, 11:24 PM

## 2022-03-04 NOTE — Progress Notes (Signed)
Cardiology Progress Note   Patient Name: Lindsey [name] Date of Encounter: 03/04/2022  Primary Cardiologist: Ida Rogue, MD  Subjective   Feels well this AM.  Eager to go home.  Inpatient Medications    Scheduled Meds:  ARIPiprazole  2 mg Oral Daily   aspirin EC  81 mg Oral Daily   atorvastatin  40 mg Oral Daily   bisoprolol  5 mg Oral Daily   clopidogrel  75 mg Oral Q breakfast   enoxaparin (LOVENOX) injection  40 mg Subcutaneous Q24H   [START ON 03/05/2022] furosemide  40 mg Oral Daily   losartan  25 mg Oral Daily   mometasone-formoterol  2 puff Inhalation BID   potassium chloride  20 mEq Oral BID   sodium chloride flush  3 mL Intravenous Q12H   venlafaxine XR  225 mg Oral Daily   Continuous Infusions:  sodium chloride     PRN Meds: sodium chloride, acetaminophen **OR** acetaminophen, butalbital-acetaminophen-caffeine, cyclobenzaprine, hydrOXYzine, nitroGLYCERIN, ondansetron (ZOFRAN) IV, polyethylene glycol, sodium chloride flush, sodium chloride HYPERTONIC   Vital Signs    Vitals:   03/04/22 0800 03/04/22 0833 03/04/22 1000 03/04/22 1151  BP:  136/79  133/78  Pulse:  (!) 49  (!) 48  Resp: '19  20 19  '$ Temp:    98.1 F (36.7 C)  TempSrc:    Oral  SpO2:  95%  93%  Weight:      Height:        Intake/Output Summary (Last 24 hours) at 03/04/2022 1223 Last data filed at 03/04/2022 0900 Gross per 24 hour  Intake 1080 ml  Output 1100 ml  Net -20 ml   Filed Weights   03/02/22 1457 03/03/22 0620 03/03/22 0927  Weight: 58.6 kg 57.7 kg 57.7 kg    Physical Exam   GEN: Well nourished, well developed, in no acute distress.  HEENT: Grossly normal.  Neck: Supple, no JVD, carotid bruits, or masses. Cardiac: RRR, no murmurs, rubs, or gallops. No clubbing, cyanosis, edema.  Radials 2+, DP/PT 2+ and equal bilaterally.  Respiratory:  Respirations regular and unlabored, clear to auscultation bilaterally. GI: Soft, nontender, nondistended, BS + x 4. MS: no deformity  or atrophy. Skin: warm and dry, no rash. Neuro:  Strength and sensation are intact. Psych: AAOx3.  Normal affect.  Labs    Chemistry Recent Labs  Lab 03/01/22 1625 03/02/22 0352 03/03/22 0517 03/04/22 0607  NA 136 139 137 136  K 3.6 3.3* 4.0 3.9  CL 105 104 102 99  CO2 '25 28 28 30  '$ GLUCOSE 82 89 188* 105*  BUN '16 15 19 23  '$ CREATININE 0.55 0.66 0.58 0.67  CALCIUM 8.5* 8.3* 8.6* 8.5*  PROT 6.0*  --  6.0*  --   ALBUMIN 3.6  --  3.6  --   AST 99*  --  67*  --   ALT 101*  --  76*  --   ALKPHOS 86  --  81  --   BILITOT 0.7  --  0.9  --   GFRNONAA >60 >60 >60 >60  ANIONGAP '6 7 7 7     '$ Hematology Recent Labs  Lab 03/02/22 0352 03/03/22 0517 03/04/22 0607  WBC 5.4 4.7 6.9  RBC 3.92 4.20 4.19  HGB 11.2* 12.1 11.8*  HCT 34.8* 37.0 36.8  MCV 88.8 88.1 87.8  MCH 28.6 28.8 28.2  MCHC 32.2 32.7 32.1  RDW 14.6 14.4 14.7  PLT 227 233 252    Cardiac Enzymes  Recent Labs  Lab 03/01/22 1625 03/01/22 1825  TROPONINIHS 1,009* 1,044*      BNP    Component Value Date/Time   BNP 1,740.8 (H) 03/01/2022 1625   Lipids  Lab Results  Component Value Date   CHOL 186 03/02/2022   HDL 70 03/02/2022   LDLCALC 109 (H) 03/02/2022   TRIG 37 03/02/2022   CHOLHDL 2.7 03/02/2022    HbA1c  Lab Results  Component Value Date   HGBA1C 5.6 01/04/2020    Radiology    DG Chest 2 View  Result Date: 03/01/2022 CLINICAL DATA:  Shortness of breath EXAM: CHEST - 2 VIEW COMPARISON:  10/15/2021 FINDINGS: Cardiomegaly. Mild, diffuse bilateral interstitial pulmonary opacity. Small bilateral pleural effusions. Disc degenerative disease of the thoracic spine. Vertebral cement augmentation of a midthoracic vertebral body. IMPRESSION: Cardiomegaly with mild diffuse interstitial opacity and small pleural effusions, consistent with edema. No focal airspace opacity. Electronically Signed   By: Delanna Ahmadi M.D.   On: 03/01/2022 16:19   Telemetry    RSR, PACs - Personally Reviewed  Cardiac  Studies   Cardiac Catheterization  5.30.2023  Diagnostic Dominance: Right  Left Main  Vessel is large. Vessel is angiographically normal.  Left Anterior Descending  Vessel is large.  Mid LAD lesion is 30% stenosed.  First Diagonal Branch  Vessel is small in size.  Second Diagonal Branch  Vessel is small in size.  Third Diagonal Branch  Vessel is moderate in size.  3rd Diag lesion is 40% stenosed.  Left Circumflex  Vessel is large.  Mid Cx to Dist Cx lesion is 100% stenosed with 100% stenosed side branch in 2nd Mrg.  First Obtuse Marginal Branch  Vessel is moderate in size.  Second Obtuse Marginal Branch  Vessel is small in size.  Collaterals  2nd Mrg filled by collaterals from 1st Mrg.    Third Obtuse Marginal Branch  Collaterals  3rd Mrg filled by collaterals from 1st Mrg.    Collaterals  3rd Mrg filled by collaterals from Dist LAD.    Collaterals  3rd Mrg filled by collaterals from 2nd RPL.    Right Coronary Artery  Vessel is large.  Mid RCA lesion is 30% stenosed.  Right Posterior Descending Artery  Vessel is moderate in size.  Right Posterior Atrioventricular Artery  Vessel is moderate in size.  First Right Posterolateral Branch  Vessel is small in size.  Second Right Posterolateral Branch  Vessel is small in size.  Third Right Posterolateral Branch  Vessel is small in size.   Right Heart Pressures RA (mean): 7 mmHg RV (S/EDP): 60/10 mmHg PA (S/D, mean): 56/25 (35) mmHg PCWP (mean): 23 mmHg (with v-waves up to 35 mmHg).  Ao sat: 92% PA sat: 63%  Fick CO: 4.4 L/min Fick CI: 2.8 L/min/m^2   Left Ventricle There is moderate left ventricular systolic dysfunction. LV end diastolic pressure is moderately elevated. LVEDP 25 mmHg. The left ventricular ejection fraction is 35-45% by visual estimate. There are LV function abnormalities due to segmental dysfunction.  _____________  2D Echocardiogram 5.31.2023  Pending _____________   Patient Profile      86 y.o. female with history of atrial tachycardia, right lower extremity DVT status post IVC filter, labile hypertension, and bronchiectasis, admitted 03/01/2022 with NSTEMI and acute heart failure.  Assessment & Plan    1.  NSTEMI/CAD:  Admitted 5/28 w/ chest pain and dyspnea in the setting of volume overload after initial c/p event ~ 3 wks ago.  Cath 5/30 w/  occluded mid/distal LCX w/ L  L and R  L collaterals.  No target for PCI and med rx recommended.  No c/p or dyspnea.  Ambulating w/o difficulty.  [place] for d/c from cardiac standpoint.  Cont asa, plavix, ? blocker, and high potency statin rx.  Plan cardiac rehab following d/c.  F/u in 7-10 days.  2.  Acute HFrEF/ICM:  EF 35-45% by LV gram (echo this AM) w/ PCWP 23, PA 56/25(35), and LVEDP of 25 mmHg on cath.  She has responded well to IV lasix and was minus 1.6L overnight, and minus 4.4L since admission.  Euvolemic on exam this AM.  Renal fxn, lytes stable.  Wt lower than recent baseline.  Will transition lasix to '40mg'$  PO daily (prev home dose was 20 daily).  BP stable.  Losartan added this AM.  Cont ? blocker.  Consider SGLT2i and MRA in outpt setting.  3.  Labile HTN:  BP now trending in the 130's.  Cont ? blocker, ARB, lasix.  4.  HL:  LDL 129.  Cont high potency statin rx.  Signed, Murray Hodgkins, NP  03/04/2022, 12:23 PM    For questions or updates, please contact   Please consult [email or web address] for contact info under Cardiology/STEMI.

## 2022-03-05 LAB — LIPOPROTEIN A (LPA): Lipoprotein (a): 10.2 nmol/L (ref <75.0)

## 2022-03-10 NOTE — Progress Notes (Unsigned)
Patient ID: Lindsey [name], female    DOB: 1936-01-27, 86 y.o.   MRN: [id number]  HPI  Lindsey [name] is a 86 y/o female with a history of carotid disease, HTN, anemia, atrial tachycardia, breast cancer, colitis, COPD, depression, DVT, fibromyalgia, OA, PAD, polio and chronic heart failure.   Echo report from 03/04/22 reviewed and showed an EF of 40-45% along with mild LVH and mild MR.   RHC/LHC done 03/03/22 and showed: Severe single vessel coronary artery disease with occlusion of mid LCx with left-to-left and right-to-left collaterals supplying distal OM branches.  Given patient's history of chest pain ~3 weeks ago following by progressive heart failure symptoms, I suspect her initial coronary event occurred at that time. Mild-moderate, non-obstructive CAD involving the LAD and RCA. Mildly-moderately elevated left heart filling pressures. Moderate pulmonary hypertension. Normal Fick cardiac output/index.  Admitted 03/01/22 due to shortness of breath, weight gain, edema, intermittent chest pain due to acute on chronic heart failure. Given IV heparin and IV lasix.  Cardiology consult obtained. IV lasix transitioned to oral diuretics. Cath done with mild CAD. Discharged after 3 days.   She presents today for her initial visit with a chief complaint of moderate fatigue with minimal exertion. Describes this as chronic in nature having been present for several years. She has associated cough, shortness of breath, left knee pain and dizziness along with this. She denies any difficulty sleeping, abdominal distention, palpitations, pedal edema, chest pain, wheezing or weight gain.   Is interested in resuming exercise and was asking if she could return to water aerobics or participate in pulmonary rehab.   She is supposed to go to granddaughter's wedding in [place] in August but is concerned because she has had elevation sickness when there previously. She has a history of bronchiectasis and is wondering  if having portable oxygen would be helpful. Has not been seen by pulmonology.   Past Medical History:  Diagnosis Date   Anemia    Atrial tachycardia (HCC)    Breast cancer (HCC)    Bronchiectasis ([place]) 06/2007   CHF (congestive heart failure) (HCC)    Chronic Dizziness    Colitis    COPD (chronic obstructive pulmonary disease) (HCC)    Depression    Diastolic dysfunction    a. 12/2019 Echo: EF 60-65%, no rwma, GrII DD. Nl RV size/fxn. Mild MR/AI.   DVT (deep venous thrombosis) ([place])    after her right knee surgery.    Fibromyalgia    Labile Hypertension    Migraine headache with aura    Osteoarthritis    Osteoporosis    PAD (peripheral artery disease) ([place])    Pneumonia    Polio 1952   Stenosis of carotid artery    Past Surgical History:  Procedure Laterality Date   ABDOMINAL HYSTERECTOMY     BACK SURGERY     BILATERAL TOTAL MASTECTOMY WITH AXILLARY LYMPH NODE DISSECTION     BREAST SURGERY     CARPAL TUNNEL RELEASE     bilateral    COLONOSCOPY WITH PROPOFOL N/A 08/02/2015   Procedure: COLONOSCOPY WITH PROPOFOL;  Surgeon: Lindsey Silvas, MD;  Location: [facility] ENDOSCOPY;  Service: Endoscopy;  Laterality: N/A;   FINGER TENDON REPAIR     HERNIA REPAIR     REPLACEMENT TOTAL KNEE     right knee    RIGHT/LEFT HEART CATH AND CORONARY ANGIOGRAPHY N/A 03/03/2022   Procedure: RIGHT/LEFT HEART CATH AND CORONARY ANGIOGRAPHY;  Surgeon: Lindsey Bush, MD;  Location: [facility]  CV LAB;  Service: Cardiovascular;  Laterality: N/A;   SQUAMOUS CELL CARCINOMA EXCISION     TONSILLECTOMY     TOTAL KNEE ARTHROPLASTY Left 09/19/2020   Procedure: TOTAL KNEE ARTHROPLASTY;  Surgeon: Lindsey Knows, MD;  Location: [facility] ORS;  Service: Orthopedics;  Laterality: Left;   UMBILICAL HERNIA REPAIR     VAGINAL HYSTERECTOMY     Family History  Problem Relation Age of Onset   Pancreatic cancer Mother    Arthritis Mother    Arthritis Father    Bladder Cancer Father    Atrial fibrillation Sister     Social History   Tobacco Use   Smoking status: Never   Smokeless tobacco: Never  Substance Use Topics   Alcohol use: Yes    Alcohol/week: 4.0 standard drinks    Types: 4 Glasses of wine per week   Allergies  Allergen Reactions   Ivp Dye [Iodinated Contrast Media] Anaphylaxis   Codeine Other (See Comments)    Dizziness  Dizziness   Cucumber Extract    Demeclocycline Other (See Comments)    Stomach upset    Sulfa Antibiotics Hives   Tegaderm Ag Mesh [Silver]    Telmisartan     dizziness   Tetracyclines & Related     Stomach upset    Prior to Admission medications   Medication Sig Start Date End Date Taking? Authorizing Provider  ARIPiprazole (ABILIFY) 2 MG tablet Take 2 mg by mouth daily.    Yes [provider]  aspirin EC 81 MG tablet Take 1 tablet (81 mg total) by mouth daily. Swallow whole. 03/05/22  Yes Lindsey Riding, MD  atorvastatin (LIPITOR) 40 MG tablet Take 1 tablet (40 mg total) by mouth daily. 03/05/22  Yes Lindsey Riding, MD  bisoprolol (ZEBETA) 5 MG tablet Take 1 tablet (5 mg total) by mouth daily. 09/16/21  Yes Lindsey Merritts, MD  clopidogrel (PLAVIX) 75 MG tablet Take 1 tablet (75 mg total) by mouth daily with breakfast. 03/05/22  Yes Gonfa, Lindsey Ivory, MD  cyclobenzaprine (FLEXERIL) 5 MG tablet Take 5 mg by mouth at bedtime as needed. 04/14/21  Yes [provider]  furosemide (LASIX) 40 MG tablet Take 1 tablet (40 mg total) by mouth daily. 03/05/22 05/04/22 Yes Lindsey Riding, MD  hydrOXYzine (ATARAX/VISTARIL) 25 MG tablet Take 25 mg by mouth as needed for anxiety.   Yes [provider]  losartan (COZAAR) 25 MG tablet Take 1 tablet (25 mg total) by mouth daily. 03/05/22  Yes Lindsey Riding, MD  Multiple Vitamins-Minerals (PRESERVISION AREDS 2 PO) Take by mouth.   Yes [provider]  NIACIN PO Take 10 mg by mouth.   Yes [provider]  polyethylene glycol (MIRALAX / GLYCOLAX) packet Take 17 g by mouth daily as needed for mild  constipation or moderate constipation.    Yes [provider]  potassium chloride SA (KLOR-CON M) 20 MEQ tablet Take 1 tablet (20 mEq total) by mouth 2 (two) times daily. 03/04/22  Yes Lindsey Riding, MD  rizatriptan (MAXALT) 5 MG tablet Take 5 mg by mouth as needed for migraine. As needed. Needed 3 times in past 3 months 01/16/20  Yes [provider]  sodium chloride HYPERTONIC 3 % nebulizer solution Take 5 mLs by nebulization every 4 (four) hours as needed. 02/09/22  Yes [provider]  SYMBICORT 160-4.5 MCG/ACT inhaler SMARTSIG:2 Inhalation Via Inhaler Twice Daily 08/21/21  Yes [provider]  venlafaxine XR (EFFEXOR-XR) 150 MG 24  hr capsule Take 150 mg by mouth daily. 02/02/22  Yes [provider]  vitamin B-6 (PYRIDOXINE) 25 MG tablet Take 2.5 mg by mouth daily.   Yes [provider]  Galcanezumab-gnlm (EMGALITY) 120 MG/ML SOAJ Inject 120 mg into the skin every 30 (thirty) days. Patient not taking: Reported on 03/11/2022 06/27/20   [provider]   Review of Systems  Constitutional:  Positive for fatigue (easily). Negative for appetite change.  HENT:  Negative for congestion, postnasal drip and sore throat.   Eyes: Negative.   Respiratory:  Positive for cough (productive) and shortness of breath (easily). Negative for chest tightness and wheezing.   Cardiovascular:  Negative for chest pain, palpitations and leg swelling.  Gastrointestinal:  Negative for abdominal distention and abdominal pain.  Endocrine: Negative.   Genitourinary: Negative.   Musculoskeletal:  Positive for arthralgias (left knee). Negative for back pain and neck pain.  Skin: Negative.   Allergic/Immunologic: Negative.   Neurological:  Positive for dizziness and light-headedness.  Hematological:  Negative for adenopathy. Does not bruise/bleed easily.  Psychiatric/Behavioral:  Negative for dysphoric mood and sleep disturbance (sleeping on 1 pillow). The patient is  not nervous/anxious.    Vitals:   03/11/22 1413  BP: 117/69  Pulse: 61  Resp: 16  SpO2: 98%  Weight: 126 lb 6 oz (57.3 kg)  Height: '5\' 2"'$  (1.575 m)   Wt Readings from Last 3 Encounters:  03/11/22 126 lb 6 oz (57.3 kg)  03/03/22 127 lb 4.8 oz (57.7 kg)  01/22/22 128 lb (58.1 kg)   Lab Results  Component Value Date   CREATININE 0.67 03/04/2022   CREATININE 0.58 03/03/2022   CREATININE 0.66 03/02/2022   Physical Exam Vitals and nursing note reviewed. Exam conducted with a chaperone present (friend).  Constitutional:      Appearance: She is well-developed.  HENT:     Head: Normocephalic and atraumatic.  Neck:     Vascular: No JVD.  Cardiovascular:     Rate and Rhythm: Normal rate and regular rhythm.  Pulmonary:     Effort: Pulmonary effort is normal.     Breath sounds: No wheezing, rhonchi or rales.  Abdominal:     Palpations: Abdomen is soft.     Tenderness: There is no abdominal tenderness.  Musculoskeletal:     Cervical back: Normal range of motion and neck supple.     Right lower leg: No edema.     Left lower leg: No tenderness. No edema.  Skin:    General: Skin is warm and dry.  Neurological:     General: No focal deficit present.     Mental Status: She is alert and oriented to person, place, and time.  Psychiatric:        Mood and Affect: Mood normal.        Behavior: Behavior normal.    Assessment & Plan:  1: Chronic heart failure with mildly reduced ejection fraction- - NYHA class III - euvolemic today - weighing daily; reminded to call for an overnight weight gain of > 2 pounds or a weekly weight gain of > 5 pounds - not adding salt to her food - saw cardiology Rockey Situ) 01/22/22 - pulmonary rehab referral placed - BNP 03/01/22 was 1740.8 - PharmD reconciled medications with patient and friend  2: HTN (labile)- - BP looks good (117/69) - saw PCP (Hande) 03/10/22 - BMP 03/04/22 reviewed and showed sodium 136, potassium 3.9, creatinine 0.67 and GFR  >60  3: COPD/ bronchiectasis- -  patient asking about portable oxygen for when she goes to [place] - has not seen pulmonology in the past - will make pulmonology referral; she requests [facility] since that is where her PCP is at - this was scheduled with Dr. Lanney Gins for tomorrow   Medication bottles reviewed.   Return in 1 month, sooner if needed.

## 2022-03-11 ENCOUNTER — Ambulatory Visit: Payer: Medicare Other | Attending: Family | Admitting: Family

## 2022-03-11 ENCOUNTER — Encounter: Payer: Self-pay | Admitting: Family

## 2022-03-11 VITALS — BP 117/69 | HR 61 | Resp 16 | Ht 62.0 in | Wt 126.4 lb

## 2022-03-11 DIAGNOSIS — I2510 Atherosclerotic heart disease of native coronary artery without angina pectoris: Secondary | ICD-10-CM | POA: Diagnosis not present

## 2022-03-11 DIAGNOSIS — M25562 Pain in left knee: Secondary | ICD-10-CM | POA: Insufficient documentation

## 2022-03-11 DIAGNOSIS — I110 Hypertensive heart disease with heart failure: Secondary | ICD-10-CM | POA: Insufficient documentation

## 2022-03-11 DIAGNOSIS — I739 Peripheral vascular disease, unspecified: Secondary | ICD-10-CM | POA: Diagnosis not present

## 2022-03-11 DIAGNOSIS — Z86718 Personal history of other venous thrombosis and embolism: Secondary | ICD-10-CM | POA: Insufficient documentation

## 2022-03-11 DIAGNOSIS — I5022 Chronic systolic (congestive) heart failure: Secondary | ICD-10-CM | POA: Diagnosis not present

## 2022-03-11 DIAGNOSIS — I10 Essential (primary) hypertension: Secondary | ICD-10-CM

## 2022-03-11 DIAGNOSIS — J479 Bronchiectasis, uncomplicated: Secondary | ICD-10-CM | POA: Insufficient documentation

## 2022-03-11 DIAGNOSIS — Z853 Personal history of malignant neoplasm of breast: Secondary | ICD-10-CM | POA: Insufficient documentation

## 2022-03-11 DIAGNOSIS — J449 Chronic obstructive pulmonary disease, unspecified: Secondary | ICD-10-CM | POA: Diagnosis not present

## 2022-03-11 NOTE — Patient Instructions (Addendum)
Continue weighing daily and call for an overnight weight gain of 3 pounds or more or a weekly weight gain of more than 5 pounds.   If you receive a satisfaction survey regarding the [facility], please take the time to fill it out. This way we can continue to provide excellent care and make any changes that need to be made.    Dr. [facility] [address] [place],  [address] [phone] 03/12/22 at 3:00pm

## 2022-03-12 NOTE — Progress Notes (Signed)
[place] - PHARMACIST COUNSELING NOTE  Guideline-Directed Medical Therapy/Evidence Based Medicine  ACE/ARB/ARNI: Losartan 25 mg daily Beta Blocker:bisoprolol 5 mg daily Aldosterone Antagonist: None Diuretic: Furosemide 40 mg daily SGLT2i: None  Adherence Assessment  Do you ever forget to take your medication? '[]'$ Yes '[x]'$ No  Do you ever skip doses due to side effects? '[]'$ Yes '[x]'$ No  Do you have trouble affording your medicines? '[]'$ Yes '[x]'$ No  Are you ever unable to pick up your medication due to transportation difficulties? '[]'$ Yes '[x]'$ No  Do you ever stop taking your medications because you don't believe they are helping? '[]'$ Yes '[x]'$ No  Do you check your weight daily? '[]'$ Yes '[x]'$ No   Adherence strategy: friend assists in care; pill box  Barriers to obtaining medications: none  Vital signs: HR 61, BP 117/69, weight (pounds) 126 lb ECHO: Date 03/04/22, EF 40-45%, notes hypokinesis; grade III diastolic dysfunction     Latest Ref Rng & Units 03/04/2022    6:07 AM 03/03/2022    5:17 AM 03/02/2022    3:52 AM  BMP  Glucose 70 - 99 mg/dL 105  188  89   BUN 8 - 23 mg/dL '23  19  15   '$ Creatinine 0.44 - 1.00 mg/dL 0.67  0.58  0.66   Sodium 135 - 145 mmol/L 136  137  139   Potassium 3.5 - 5.1 mmol/L 3.9  4.0  3.3   Chloride 98 - 111 mmol/L 99  102  104   CO2 22 - 32 mmol/L '30  28  28   '$ Calcium 8.9 - 10.3 mg/dL 8.5  8.6  8.3     Past Medical History:  Diagnosis Date   Anemia    Atrial tachycardia (HCC)    Breast cancer (HCC)    Bronchiectasis ([place]) 06/2007   CHF (congestive heart failure) (HCC)    Chronic Dizziness    Colitis    COPD (chronic obstructive pulmonary disease) (HCC)    Depression    Diastolic dysfunction    a. 12/2019 Echo: EF 60-65%, no rwma, GrII DD. Nl RV size/fxn. Mild MR/AI.   DVT (deep venous thrombosis) ([place])    after her right knee surgery.    Fibromyalgia    Labile Hypertension    Migraine headache with aura     Osteoarthritis    Osteoporosis    PAD (peripheral artery disease) ([place])    Pneumonia    Polio 1952   Stenosis of carotid artery     ASSESSMENT 86 year old female who presents to the HF clinic for follow up. Reports doing well since discharge from ED on 5/28. Checks blood pressure and weight at ome. Questions about supplements including calcium and vitamin D.     PLAN Reconciled medications Supported Vitamin D supplementation and calcium with daily dose recommendation.     Time spent: 15 minutes  Glean Salvo, PharmD Pharmacy Resident  03/12/2022 6:29 AM     Current Outpatient Medications:    ARIPiprazole (ABILIFY) 2 MG tablet, Take 2 mg by mouth daily. , Disp: , Rfl:    aspirin EC 81 MG tablet, Take 1 tablet (81 mg total) by mouth daily. Swallow whole., Disp: 30 tablet, Rfl: 12   atorvastatin (LIPITOR) 40 MG tablet, Take 1 tablet (40 mg total) by mouth daily., Disp: 90 tablet, Rfl: 1   bisoprolol (ZEBETA) 5 MG tablet, Take 1 tablet (5 mg total) by mouth daily., Disp: 90 tablet, Rfl: 3   clopidogrel (PLAVIX) 75 MG tablet,  Take 1 tablet (75 mg total) by mouth daily with breakfast., Disp: 90 tablet, Rfl: 1   cyclobenzaprine (FLEXERIL) 5 MG tablet, Take 5 mg by mouth at bedtime as needed., Disp: , Rfl:    furosemide (LASIX) 40 MG tablet, Take 1 tablet (40 mg total) by mouth daily., Disp: 30 tablet, Rfl: 1   Galcanezumab-gnlm (EMGALITY) 120 MG/ML SOAJ, Inject 120 mg into the skin every 30 (thirty) days. (Patient not taking: Reported on 03/11/2022), Disp: , Rfl:    hydrOXYzine (ATARAX/VISTARIL) 25 MG tablet, Take 25 mg by mouth as needed for anxiety., Disp: , Rfl:    losartan (COZAAR) 25 MG tablet, Take 1 tablet (25 mg total) by mouth daily., Disp: 90 tablet, Rfl: 1   Multiple Vitamins-Minerals (PRESERVISION AREDS 2 PO), Take by mouth., Disp: , Rfl:    NIACIN PO, Take 10 mg by mouth., Disp: , Rfl:    polyethylene glycol (MIRALAX / GLYCOLAX) packet, Take 17 g by mouth daily  as needed for mild constipation or moderate constipation. , Disp: , Rfl:    potassium chloride SA (KLOR-CON M) 20 MEQ tablet, Take 1 tablet (20 mEq total) by mouth 2 (two) times daily., Disp: 30 tablet, Rfl: 1   rizatriptan (MAXALT) 5 MG tablet, Take 5 mg by mouth as needed for migraine. As needed. Needed 3 times in past 3 months, Disp: , Rfl:    sodium chloride HYPERTONIC 3 % nebulizer solution, Take 5 mLs by nebulization every 4 (four) hours as needed., Disp: , Rfl:    SYMBICORT 160-4.5 MCG/ACT inhaler, SMARTSIG:2 Inhalation Via Inhaler Twice Daily, Disp: , Rfl:    venlafaxine XR (EFFEXOR-XR) 150 MG 24 hr capsule, Take 150 mg by mouth daily., Disp: , Rfl:    vitamin B-6 (PYRIDOXINE) 25 MG tablet, Take 2.5 mg by mouth daily., Disp: , Rfl:    COUNSELING POINTS/CLINICAL PEARLS    DRUGS TO CAUTION IN HEART FAILURE  Drug or Class Mechanism  Analgesics NSAIDs COX-2 inhibitors Glucocorticoids  Sodium and water retention, increased systemic vascular resistance, decreased response to diuretics   Diabetes Medications Metformin Thiazolidinediones Rosiglitazone (Avandia) Pioglitazone (Actos) DPP4 Inhibitors Saxagliptin (Onglyza) Sitagliptin (Januvia)   Lactic acidosis Possible calcium channel blockade   Unknown  Antiarrhythmics Class I  Flecainide Disopyramide Class III Sotalol Other Dronedarone  Negative inotrope, proarrhythmic   Proarrhythmic, beta blockade  Negative inotrope  Antihypertensives Alpha Blockers Doxazosin Calcium Channel Blockers Diltiazem Verapamil Nifedipine Central Alpha Adrenergics Moxonidine Peripheral Vasodilators Minoxidil  Increases renin and aldosterone  Negative inotrope    Possible sympathetic withdrawal  Unknown  Anti-infective Itraconazole Amphotericin B  Negative inotrope Unknown  Hematologic Anagrelide Cilostazol   Possible inhibition of PD IV Inhibition of PD III causing arrhythmias   Neurologic/Psychiatric Stimulants Anti-Seizure Drugs Carbamazepine Pregabalin Antidepressants Tricyclics Citalopram Parkinsons Bromocriptine Pergolide Pramipexole Antipsychotics Clozapine Antimigraine Ergotamine Methysergide Appetite suppressants Bipolar Lithium  Peripheral alpha and beta agonist activity  Negative inotrope and chronotrope Calcium channel blockade  Negative inotrope, proarrhythmic Dose-dependent QT prolongation  Excessive serotonin activity/valvular damage Excessive serotonin activity/valvular damage Unknown  IgE mediated hypersensitivy, calcium channel blockade  Excessive serotonin activity/valvular damage Excessive serotonin activity/valvular damage Valvular damage  Direct myofibrillar degeneration, adrenergic stimulation  Antimalarials Chloroquine Hydroxychloroquine Intracellular inhibition of lysosomal enzymes  Urologic Agents Alpha Blockers Doxazosin Prazosin Tamsulosin Terazosin  Increased renin and aldosterone  Adapted from Page Carleene Overlie, et al. "Drugs That May Cause or Exacerbate Heart Failure: A Scientific Statement from the American Heart  Association." Circulation 2016; 134:e32-e69. DOI: 10.1161/CIR.0000000000000426   MEDICATION ADHERENCES  TIPS AND STRATEGIES Taking medication as prescribed improves patient outcomes in heart failure (reduces hospitalizations, improves symptoms, increases survival) Side effects of medications can be managed by decreasing doses, switching agents, stopping drugs, or adding additional therapy. Please let someone in the [facility] know if you have having bothersome side effects so we can modify your regimen. Do not alter your medication regimen without talking to [place].  Medication reminders can help patients remember to take drugs on time. If you are missing or forgetting doses you can try linking behaviors, using pill boxes, or an electronic reminder like an alarm on your phone or an app. Some people  can also get automated phone calls as medication reminders.

## 2022-03-16 ENCOUNTER — Telehealth: Payer: Self-pay | Admitting: Family

## 2022-03-16 NOTE — Telephone Encounter (Signed)
Patient called out of concern as she is having the very occasional SOB with exertion only and wasn't sure if she should be worried with no other symptoms. I told patient that if she is having no other issues that occasional SOB is associated with heart failure but to just keep an eye on it and if that related to weight gain or other symptoms to call back and we will add her to schedule. Patient advised me at that time she wants to be referred to cardiac rehab which we advised patient we have already done that at her previous visit and it has to be approved by cardiology first due to insurance and they will then reach out to patient.   Yassmin Binegar, NT

## 2022-03-19 NOTE — Progress Notes (Deleted)
Cardiology Office Note    Date:  03/19/2022   ID:  Lindsey [name] [birth year], MRN [id number]  PCP:  Tracie Harrier, MD  Cardiologist:  Ida Rogue, MD  Electrophysiologist:  None   Chief Complaint: Hospital follow up  History of Present Illness:   Lindsey [name] is a 86 y.o. female with history of CAD with NSTEMI in 02/2022 medically managed as outlined below, HFrEF secondary to ICM, atrial tachycardia, right lower extremity DVT status post IVC filter, bronchiectasis, labile hypertension, HLD, and migraines who presents for hospital follow-up as outlined below.  She underwent outpatient cardiac monitoring in 10/2021, for dizziness, which showed sinus rhythm with an average rate of 55 bpm, 5 runs of SVT/atrial tachycardia with the fastest and longest interval lasting 10 beats, and frequent PACs representing a 12.9% burden.  Echo in 11/2021 demonstrated an EF of 55 to 60%, no regional wall motion abnormalities, mildly dilated LV internal cavity size, mild LVH, indeterminate LV diastolic function parameters, normal RV systolic function with mildly enlarged ventricular cavity size, mild biatrial enlargement, mild mitral regurgitation, mild aortic insufficiency, and an estimated right atrial pressure of 3 mmHg.   She traveled to [place] in the spring of 2023, and reported severe deep left sided chest aching while there without associated symptoms that lasted for approximately 30 minutes.  She did not seek medical attention at that time.  Since returning back to [place], she noted a progression of lower extremity swelling, abdominal distention, and dyspnea.  She was subsequently admitted to [facility] on 5/28 through 5/31 with an NSTEMI and HFrEF.  BNP 1740.  High-sensitivity troponin 1044.  R/LHC demonstrated severe single-vessel CAD with occlusion of the mid LCx with left to left and right to left collaterals supplying distal OM branches.  Given the patient's history of chest pain  began approximately 3 weeks prior, followed by progressive heart failure symptoms, it was suspected her initial coronary event occurred at that time.  There was mild to moderate nonobstructive CAD involving the LAD and RCA.  Mildly to moderately elevated left heart filling pressures.  Moderate pulmonary hypertension.  Normal cardiac output/index.  There was no role for PCI to the occluded LCx given the event was felt to have occurred approximately 3 weeks prior.  Medical management was recommended.  Echo during the admission demonstrated an EF of 40 to 45%, mild LVH, grade 3 diastolic dysfunction, moderate hypokinesis of the basal mid inferolateral wall, low normal RV systolic function with normal ventricular cavity size, mild mitral regurgitation, mild aortic insufficiency, and an estimated right atrial pressure 15 mmHg.  ***   Labs independently reviewed: 03/2022 - Hgb 12.9, PLT 282 potassium 4.4, BUN 26, serum creatinine 0.7, albumin 3.9, AST/LT normal, TC 165, TG 95, HDL 72, LDL 74, TSH normal, magnesium 1.9 02/2022 - LPa 10.2  Past Medical History:  Diagnosis Date   Anemia    Atrial tachycardia (HCC)    Breast cancer (HCC)    Bronchiectasis ([place]) 06/2007   CHF (congestive heart failure) (HCC)    Chronic Dizziness    Colitis    COPD (chronic obstructive pulmonary disease) (HCC)    Depression    Diastolic dysfunction    a. 12/2019 Echo: EF 60-65%, no rwma, GrII DD. Nl RV size/fxn. Mild MR/AI.   DVT (deep venous thrombosis) ([place])    after her right knee surgery.    Fibromyalgia    Labile Hypertension    Migraine headache with aura    Osteoarthritis  Osteoporosis    PAD (peripheral artery disease) ([place])    Pneumonia    Polio 1952   Stenosis of carotid artery     Past Surgical History:  Procedure Laterality Date   ABDOMINAL HYSTERECTOMY     BACK SURGERY     BILATERAL TOTAL MASTECTOMY WITH AXILLARY LYMPH NODE DISSECTION     BREAST SURGERY     CARPAL TUNNEL RELEASE      bilateral    COLONOSCOPY WITH PROPOFOL N/A 08/02/2015   Procedure: COLONOSCOPY WITH PROPOFOL;  Surgeon: Manya Silvas, MD;  Location: [facility] ENDOSCOPY;  Service: Endoscopy;  Laterality: N/A;   FINGER TENDON REPAIR     HERNIA REPAIR     REPLACEMENT TOTAL KNEE     right knee    RIGHT/LEFT HEART CATH AND CORONARY ANGIOGRAPHY N/A 03/03/2022   Procedure: RIGHT/LEFT HEART CATH AND CORONARY ANGIOGRAPHY;  Surgeon: Nelva Bush, MD;  Location: [facility];  Service: Cardiovascular;  Laterality: N/A;   SQUAMOUS CELL CARCINOMA EXCISION     TONSILLECTOMY     TOTAL KNEE ARTHROPLASTY Left 09/19/2020   Procedure: TOTAL KNEE ARTHROPLASTY;  Surgeon: Hessie Knows, MD;  Location: [facility] ORS;  Service: Orthopedics;  Laterality: Left;   UMBILICAL HERNIA REPAIR     VAGINAL HYSTERECTOMY      Current Medications: No outpatient medications have been marked as taking for the 03/20/22 encounter (Appointment) with Rise Mu, PA-C.    Allergies:   Ivp dye [iodinated contrast media], Codeine, Cucumber extract, Demeclocycline, Sulfa antibiotics, Tegaderm ag mesh [silver], Telmisartan, and Tetracyclines & related   Social History   Socioeconomic History   Marital status: Widowed    Spouse name: Not on file   Number of children: Not on file   Years of education: Not on file   Highest education level: Not on file  Occupational History   Not on file  Tobacco Use   Smoking status: Never   Smokeless tobacco: Never  Vaping Use   Vaping Use: Never used  Substance and Sexual Activity   Alcohol use: Yes    Alcohol/week: 4.0 standard drinks of alcohol    Types: 4 Glasses of wine per week   Drug use: No   Sexual activity: Not on file  Other Topics Concern   Not on file  Social History Narrative   Not on file   Social Determinants of Health   Financial Resource Strain: Not on file  Food Insecurity: Not on file  Transportation Needs: Not on file  Physical Activity: Not on file  Stress: Not on  file  Social Connections: Not on file     Family History:  The patient's family history includes Arthritis in her father and mother; Atrial fibrillation in her sister; Bladder Cancer in her father; Pancreatic cancer in her mother.  ROS:   ROS   EKGs/Labs/Other Studies Reviewed:    Studies reviewed were summarized above. The additional studies were reviewed today:  2D echo 03/04/2022: 1. Left ventricular ejection fraction, by estimation, is 40 to 45%. The  left ventricle has mildly decreased function. The left ventricle  demonstrates regional wall motion abnormalities (see scoring  diagram/findings for description). There is mild left  ventricular hypertrophy. Left ventricular diastolic parameters are  consistent with Grade III diastolic dysfunction (restrictive). There is  moderate hypokinesis of the left ventricular, basal-mid inferolateral  wall.   2. Right ventricular systolic function is low normal. The right  ventricular size is normal.   3. The mitral valve was  not well visualized. Mild mitral valve  regurgitation.   4. The aortic valve was not well visualized. Aortic valve regurgitation  is mild.   5. The inferior vena cava is dilated in size with <50% respiratory  variability, suggesting right atrial pressure of 15 mmHg. __________  [facility] 03/03/2022: Conclusions: Severe single vessel coronary artery disease with occlusion of mid LCx with left-to-left and right-to-left collaterals supplying distal OM branches.  Given patient's history of chest pain ~3 weeks ago following by progressive heart failure symptoms, I suspect her initial coronary event occurred at that time. Mild-moderate, non-obstructive CAD involving the LAD and RCA. Mildly-moderately elevated left heart filling pressures. Moderate pulmonary hypertension. Normal Fick cardiac output/index.   Recommendations: Dual antiplatelet therapy with aspirin and clopidogrel for 12 months. Continue gentle  diuresis. Escalate GDMT as tolerated. Aggressive secondary prevention of coronary artery disease.  No role for PCI to occluded LCx at this time, as event likely occurred ~3 weeks ago. Follow-up echocardiogram. __________  2D echo 11/24/2021: 1. Left ventricular ejection fraction, by estimation, is 55 to 60%. The  left ventricle has normal function. The left ventricle has no regional  wall motion abnormalities. The left ventricular internal cavity size was  mildly dilated. There is mild left  ventricular hypertrophy. Left ventricular diastolic parameters are  indeterminate.   2. Right ventricular systolic function is normal. The right ventricular  size is mildly enlarged.   3. Left atrial size was mildly dilated.   4. Right atrial size was mildly dilated.   5. The mitral valve is normal in structure. Mild mitral valve  regurgitation.   6. The aortic valve is grossly normal. Aortic valve regurgitation is  mild.   7. The inferior vena cava is normal in size with greater than 50%  respiratory variability, suggesting right atrial pressure of 3 mmHg.   Comparison(s): LVEF 60-65%. __________   Elwyn Reach patch 10/2021: Normal sinus rhythm avg HR of 55 bpm.    5 Supraventricular Tachycardia/atrial tachycardia runs occurred, the run with  the fastest interval lasting 10 beats with a max rate of 143 bpm (avg 136 bpm); the run with the fastest interval was also the longest.    Isolated SVEs were frequent (12.9%, 44818), SVE Couplets were rare (<1.0%, 1059), and SVE Triplets were rare (<1.0%, 17). Isolated VEs were rare (<1.0%), and no VE Couplets or VE Triplets were present.   Patient triggered events associated with PACs __________   2D echo 01/03/2020: 1. Left ventricular ejection fraction, by estimation, is 60 to 65%. The  left ventricle has normal function. The left ventricle has no regional  wall motion abnormalities. Left ventricular diastolic parameters are  consistent with Grade II  diastolic  dysfunction (pseudonormalization).   2. Right ventricular systolic function is normal. The right ventricular  size is normal.   3. The mitral valve is normal in structure. Mild mitral valve  regurgitation.   4. The aortic valve is normal in structure. Aortic valve regurgitation is  mild.     EKG:  EKG is ordered today.  The EKG ordered today demonstrates ***  Recent Labs: 03/01/2022: B Natriuretic Peptide 1,740.8 03/03/2022: ALT 76 03/04/2022: BUN 23; Creatinine, Ser 0.67; Hemoglobin 11.8; Platelets 252; Potassium 3.9; Sodium 136  Recent Lipid Panel    Component Value Date/Time   CHOL 186 03/02/2022 0352   TRIG 37 03/02/2022 0352   HDL 70 03/02/2022 0352   CHOLHDL 2.7 03/02/2022 0352   VLDL 7 03/02/2022 0352   LDLCALC 109 (H) 03/02/2022  0352    PHYSICAL EXAM:    VS:  There were no vitals taken for this visit.  BMI: There is no height or weight on file to calculate BMI.  Physical Exam  Wt Readings from Last 3 Encounters:  03/11/22 126 lb 6 oz (57.3 kg)  03/03/22 127 lb 4.8 oz (57.7 kg)  01/22/22 128 lb (58.1 kg)     ASSESSMENT & PLAN:   CAD involving the native coronary arteries with recent NSTEMI without***angina:  HFrEF secondary to ICM:  Labile hypertension:  HLD: LDL 129.  Target LDL less than 50.  Now on ***   {Are you ordering a CV Procedure (e.g. stress test, cath, DCCV, TEE, etc)?   Press F2        :[id number]}     Disposition: F/u with Dr. Rockey Situ or an APP in ***.   Medication Adjustments/Labs and Tests Ordered: Current medicines are reviewed at length with the patient today.  Concerns regarding medicines are outlined above. Medication changes, Labs and Tests ordered today are summarized above and listed in the Patient Instructions accessible in Encounters.   Signed, Christell Faith, PA-C 03/19/2022 12:05 PM     [address] [place], [place] [address] [phone]

## 2022-03-20 ENCOUNTER — Ambulatory Visit: Payer: Medicare Other | Admitting: Physician Assistant

## 2022-03-23 ENCOUNTER — Ambulatory Visit: Payer: PPO | Admitting: Cardiovascular Disease

## 2022-03-25 NOTE — Progress Notes (Deleted)
Cardiology Office Note    Date:  03/25/2022   ID:  Lindsey [name], Lindsey [name] 1936-09-14, MRN [id number]  PCP:  Tracie Harrier, MD  Cardiologist:  Ida Rogue, MD  Electrophysiologist:  None   Chief Complaint: Hospital follow-up  History of Present Illness:   Lindsey [name] is a 86 y.o. female with history of CAD with NSTEMI in 02/2022 medically managed as outlined below, HFrEF secondary to ICM, atrial tachycardia, right lower extremity DVT status post IVC filter, bronchiectasis, labile hypertension, HLD, and migraines who presents for hospital follow-up as outlined below.  She underwent outpatient cardiac monitoring in 10/2021, for dizziness, which showed sinus rhythm with an average rate of 55 bpm, 5 runs of SVT/atrial tachycardia with the fastest and longest interval lasting 10 beats, and frequent PACs representing a 12.9% burden.  Echo in 11/2021 demonstrated an EF of 55 to 60%, no regional wall motion abnormalities, mildly dilated LV internal cavity size, mild LVH, indeterminate LV diastolic function parameters, normal RV systolic function with mildly enlarged ventricular cavity size, mild biatrial enlargement, mild mitral regurgitation, mild aortic insufficiency, and an estimated right atrial pressure of 3 mmHg.   She traveled to [place] in the spring of 2023, and reported severe deep left sided chest aching while there without associated symptoms that lasted for approximately 30 minutes.  She did not seek medical attention at that time.  Since returning back to [place], she noted a progression of lower extremity swelling, abdominal distention, and dyspnea.  She was subsequently admitted to [facility] on 5/28 through 5/31 with an NSTEMI and HFrEF.  BNP 1740.  High-sensitivity troponin 1044.  R/LHC demonstrated severe single-vessel CAD with occlusion of the mid LCx with left to left and right to left collaterals supplying distal OM branches.  Given the patient's history of chest pain  began approximately 3 weeks prior, followed by progressive heart failure symptoms, it was suspected her initial coronary event occurred at that time.  There was mild to moderate nonobstructive CAD involving the LAD and RCA.  Mildly to moderately elevated left heart filling pressures.  Moderate pulmonary hypertension.  Normal cardiac output/index.  There was no role for PCI to the occluded LCx given the event was felt to have occurred approximately 3 weeks prior.  Medical management was recommended.  Echo during the admission demonstrated an EF of 40 to 45%, mild LVH, grade 3 diastolic dysfunction, moderate hypokinesis of the basal mid inferolateral wall, low normal RV systolic function with normal ventricular cavity size, mild mitral regurgitation, mild aortic insufficiency, and an estimated right atrial pressure 15 mmHg.  ***   Labs independently reviewed: 03/2022 - Hgb 12.9, PLT 282 potassium 4.4, BUN 26, serum creatinine 0.7, albumin 3.9, AST/LT normal, TC 165, TG 95, HDL 72, LDL 74, TSH normal, magnesium 1.9 02/2022 - LPa 10.2    Past Medical History:  Diagnosis Date   Anemia    Atrial tachycardia (HCC)    Breast cancer (HCC)    Bronchiectasis ([place]) 06/2007   CHF (congestive heart failure) (HCC)    Chronic Dizziness    Colitis    COPD (chronic obstructive pulmonary disease) (HCC)    Depression    Diastolic dysfunction    a. 12/2019 Echo: EF 60-65%, no rwma, GrII DD. Nl RV size/fxn. Mild MR/AI.   DVT (deep venous thrombosis) ([place])    after her right knee surgery.    Fibromyalgia    Labile Hypertension    Migraine headache with aura    Osteoarthritis  Osteoporosis    PAD (peripheral artery disease) ([place])    Pneumonia    Polio 1952   Stenosis of carotid artery     Past Surgical History:  Procedure Laterality Date   ABDOMINAL HYSTERECTOMY     BACK SURGERY     BILATERAL TOTAL MASTECTOMY WITH AXILLARY LYMPH NODE DISSECTION     BREAST SURGERY     CARPAL TUNNEL RELEASE      bilateral    COLONOSCOPY WITH PROPOFOL N/A 08/02/2015   Procedure: COLONOSCOPY WITH PROPOFOL;  Surgeon: Manya Silvas, MD;  Location[facility] ENDOSCOPY;  Service: Endoscopy;  Laterality: N/A;   FINGER TENDON REPAIR     HERNIA REPAIR     REPLACEMENT TOTAL KNEE     right knee    RIGHT/LEFT HEART CATH AND CORONARY ANGIOGRAPHY N/A 03/03/2022   Procedure: RIGHT/LEFT HEART CATH AND CORONARY ANGIOGRAPHY;  Surgeon: Nelva Bush, MD;  Location: [place] CV LAB;  Service: Cardiovascular;  Laterality: N/A;   SQUAMOUS CELL CARCINOMA EXCISION     TONSILLECTOMY     TOTAL KNEE ARTHROPLASTY Left 09/19/2020   Procedure: TOTAL KNEE ARTHROPLASTY;  Surgeon: Hessie Knows, MD;  Location: [facility] ORS;  Service: Orthopedics;  Laterality: Left;   UMBILICAL HERNIA REPAIR     VAGINAL HYSTERECTOMY      Current Medications: No outpatient medications have been marked as taking for the 03/27/22 encounter (Appointment) with Rise Mu, PA-C.    Allergies:   Ivp dye [iodinated contrast media], Codeine, Cucumber extract, Demeclocycline, Sulfa antibiotics, Tegaderm ag mesh [silver], Telmisartan, and Tetracyclines & related   Social History   Socioeconomic History   Marital status: Widowed    Spouse name: Not on file   Number of children: Not on file   Years of education: Not on file   Highest education level: Not on file  Occupational History   Not on file  Tobacco Use   Smoking status: Never   Smokeless tobacco: Never  Vaping Use   Vaping Use: Never used  Substance and Sexual Activity   Alcohol use: Yes    Alcohol/week: 4.0 standard drinks of alcohol    Types: 4 Glasses of wine per week   Drug use: No   Sexual activity: Not on file  Other Topics Concern   Not on file  Social History Narrative   Not on file   Social Determinants of Health   Financial Resource Strain: Not on file  Food Insecurity: Not on file  Transportation Needs: Not on file  Physical Activity: Not on file  Stress: Not on  file  Social Connections: Not on file     Family History:  The patient's family history includes Arthritis in her father and mother; Atrial fibrillation in her sister; Bladder Cancer in her father; Pancreatic cancer in her mother.  ROS:   ROS   EKGs/Labs/Other Studies Reviewed:    Studies reviewed were summarized above. The additional studies were reviewed today:  2D echo 03/04/2022: 1. Left ventricular ejection fraction, by estimation, is 40 to 45%. The  left ventricle has mildly decreased function. The left ventricle  demonstrates regional wall motion abnormalities (see scoring  diagram/findings for description). There is mild left  ventricular hypertrophy. Left ventricular diastolic parameters are  consistent with Grade III diastolic dysfunction (restrictive). There is  moderate hypokinesis of the left ventricular, basal-mid inferolateral  wall.   2. Right ventricular systolic function is low normal. The right  ventricular size is normal.   3. The mitral valve was  not well visualized. Mild mitral valve  regurgitation.   4. The aortic valve was not well visualized. Aortic valve regurgitation  is mild.   5. The inferior vena cava is dilated in size with <50% respiratory  variability, suggesting right atrial pressure of 15 mmHg. __________  [facility] 03/03/2022: Conclusions: Severe single vessel coronary artery disease with occlusion of mid LCx with left-to-left and right-to-left collaterals supplying distal OM branches.  Given patient's history of chest pain ~3 weeks ago following by progressive heart failure symptoms, I suspect her initial coronary event occurred at that time. Mild-moderate, non-obstructive CAD involving the LAD and RCA. Mildly-moderately elevated left heart filling pressures. Moderate pulmonary hypertension. Normal Fick cardiac output/index.   Recommendations: Dual antiplatelet therapy with aspirin and clopidogrel for 12 months. Continue gentle  diuresis. Escalate GDMT as tolerated. Aggressive secondary prevention of coronary artery disease.  No role for PCI to occluded LCx at this time, as event likely occurred ~3 weeks ago. Follow-up echocardiogram. __________  2D echo 11/24/2021: 1. Left ventricular ejection fraction, by estimation, is 55 to 60%. The  left ventricle has normal function. The left ventricle has no regional  wall motion abnormalities. The left ventricular internal cavity size was  mildly dilated. There is mild left  ventricular hypertrophy. Left ventricular diastolic parameters are  indeterminate.   2. Right ventricular systolic function is normal. The right ventricular  size is mildly enlarged.   3. Left atrial size was mildly dilated.   4. Right atrial size was mildly dilated.   5. The mitral valve is normal in structure. Mild mitral valve  regurgitation.   6. The aortic valve is grossly normal. Aortic valve regurgitation is  mild.   7. The inferior vena cava is normal in size with greater than 50%  respiratory variability, suggesting right atrial pressure of 3 mmHg.   Comparison(s): LVEF 60-65%. __________   Elwyn Reach patch 10/2021: Normal sinus rhythm avg HR of 55 bpm.    5 Supraventricular Tachycardia/atrial tachycardia runs occurred, the run with  the fastest interval lasting 10 beats with a max rate of 143 bpm (avg 136 bpm); the run with the fastest interval was also the longest.    Isolated SVEs were frequent (12.9%, 02774), SVE Couplets were rare (<1.0%, 1059), and SVE Triplets were rare (<1.0%, 17). Isolated VEs were rare (<1.0%), and no VE Couplets or VE Triplets were present.   Patient triggered events associated with PACs __________   2D echo 01/03/2020: 1. Left ventricular ejection fraction, by estimation, is 60 to 65%. The  left ventricle has normal function. The left ventricle has no regional  wall motion abnormalities. Left ventricular diastolic parameters are  consistent with Grade II  diastolic  dysfunction (pseudonormalization).   2. Right ventricular systolic function is normal. The right ventricular  size is normal.   3. The mitral valve is normal in structure. Mild mitral valve  regurgitation.   4. The aortic valve is normal in structure. Aortic valve regurgitation is  mild.   EKG:  EKG is ordered today.  The EKG ordered today demonstrates ***  Recent Labs: 03/01/2022: B Natriuretic Peptide 1,740.8 03/03/2022: ALT 76 03/04/2022: BUN 23; Creatinine, Ser 0.67; Hemoglobin 11.8; Platelets 252; Potassium 3.9; Sodium 136  Recent Lipid Panel    Component Value Date/Time   CHOL 186 03/02/2022 0352   TRIG 37 03/02/2022 0352   HDL 70 03/02/2022 0352   CHOLHDL 2.7 03/02/2022 0352   VLDL 7 03/02/2022 0352   LDLCALC 109 (H) 03/02/2022 0352  PHYSICAL EXAM:    VS:  There were no vitals taken for this visit.  BMI: There is no height or weight on file to calculate BMI.  Physical Exam  Wt Readings from Last 3 Encounters:  03/11/22 126 lb 6 oz (57.3 kg)  03/03/22 127 lb 4.8 oz (57.7 kg)  01/22/22 128 lb (58.1 kg)     ASSESSMENT & PLAN:   CAD involving the native coronary arteries with recent NSTEMI without***angina:  HFrEF secondary to ICM:  Labile hypertension:  HLD: LDL 129.  Target LDL less than 50.  Now on ***   {Are you ordering a CV Procedure (e.g. stress test, cath, DCCV, TEE, etc)?   Press F2        :[id number]}     Disposition: F/u with Dr. Rockey Situ or an APP in ***.   Medication Adjustments/Labs and Tests Ordered: Current medicines are reviewed at length with the patient today.  Concerns regarding medicines are outlined above. Medication changes, Labs and Tests ordered today are summarized above and listed in the Patient Instructions accessible in Encounters.   Signed, Christell Faith, PA-C 03/25/2022 8:01 AM     [facility] [place] [address] [place] [address] [place], [place] [address] [phone]

## 2022-03-27 ENCOUNTER — Ambulatory Visit: Payer: Medicare Other | Admitting: Physician Assistant

## 2022-04-01 ENCOUNTER — Emergency Department
Admission: EM | Admit: 2022-04-01 | Discharge: 2022-04-01 | Disposition: A | Discharge status: Home / Self Care | Payer: Medicare Other | Attending: Emergency Medicine | Admitting: Emergency Medicine

## 2022-04-01 ENCOUNTER — Ambulatory Visit: Payer: Medicare Other | Admitting: Internal Medicine

## 2022-04-01 ENCOUNTER — Telehealth: Payer: Self-pay | Admitting: Cardiovascular Disease

## 2022-04-01 ENCOUNTER — Emergency Department: Payer: Medicare Other

## 2022-04-01 ENCOUNTER — Other Ambulatory Visit: Payer: Self-pay

## 2022-04-01 DIAGNOSIS — J449 Chronic obstructive pulmonary disease, unspecified: Secondary | ICD-10-CM | POA: Diagnosis not present

## 2022-04-01 DIAGNOSIS — N939 Abnormal uterine and vaginal bleeding, unspecified: Secondary | ICD-10-CM | POA: Diagnosis not present

## 2022-04-01 DIAGNOSIS — I509 Heart failure, unspecified: Secondary | ICD-10-CM | POA: Diagnosis not present

## 2022-04-01 DIAGNOSIS — R0609 Other forms of dyspnea: Secondary | ICD-10-CM | POA: Insufficient documentation

## 2022-04-01 DIAGNOSIS — R0602 Shortness of breath: Secondary | ICD-10-CM | POA: Diagnosis present

## 2022-04-01 DIAGNOSIS — I2510 Atherosclerotic heart disease of native coronary artery without angina pectoris: Secondary | ICD-10-CM | POA: Insufficient documentation

## 2022-04-01 DIAGNOSIS — I110 Hypertensive heart disease with heart failure: Secondary | ICD-10-CM | POA: Insufficient documentation

## 2022-04-01 DIAGNOSIS — N3001 Acute cystitis with hematuria: Secondary | ICD-10-CM | POA: Insufficient documentation

## 2022-04-01 LAB — URINALYSIS, ROUTINE W REFLEX MICROSCOPIC
Bacteria, UA: NONE SEEN
Bilirubin Urine: NEGATIVE
Glucose, UA: NEGATIVE mg/dL
Ketones, ur: NEGATIVE mg/dL
Nitrite: NEGATIVE
Protein, ur: 100 mg/dL — AB
RBC / HPF: >50 RBC/hpf — ABNORMAL HIGH (ref 0–5)
Specific Gravity, Urine: 1.013 (ref 1.005–1.030)
WBC, UA: >50 WBC/hpf — ABNORMAL HIGH (ref 0–5)
pH: 6 (ref 5.0–8.0)

## 2022-04-01 LAB — TROPONIN I (HIGH SENSITIVITY)
Troponin I (High Sensitivity): 23 ng/L — ABNORMAL HIGH (ref <18)
Troponin I (High Sensitivity): 23 ng/L — ABNORMAL HIGH (ref <18)

## 2022-04-01 LAB — BASIC METABOLIC PANEL
Anion gap: 8 (ref 5–15)
BUN: 20 mg/dL (ref 8–23)
CO2: 27 mmol/L (ref 22–32)
Calcium: 8.9 mg/dL (ref 8.9–10.3)
Chloride: 98 mmol/L (ref 98–111)
Creatinine, Ser: 0.83 mg/dL (ref 0.44–1.00)
GFR, Estimated: >60 mL/min (ref >60)
Glucose, Bld: 107 mg/dL — ABNORMAL HIGH (ref 70–99)
Potassium: 3.7 mmol/L (ref 3.5–5.1)
Sodium: 133 mmol/L — ABNORMAL LOW (ref 135–145)

## 2022-04-01 LAB — CBC
HCT: 42.5 % (ref 36.0–46.0)
Hemoglobin: 13.4 g/dL (ref 12.0–15.0)
MCH: 28.2 pg (ref 26.0–34.0)
MCHC: 31.5 g/dL (ref 30.0–36.0)
MCV: 89.3 fL (ref 80.0–100.0)
Platelets: 239 10*3/uL (ref 150–400)
RBC: 4.76 MIL/uL (ref 3.87–5.11)
RDW: 14 % (ref 11.5–15.5)
WBC: 7.5 10*3/uL (ref 4.0–10.5)
nRBC: 0 % (ref 0.0–0.2)

## 2022-04-01 MED ORDER — CEPHALEXIN 500 MG PO CAPS: 500.0000 mg | ORAL_CAPSULE | Freq: Four times a day (QID) | ORAL | 0 refills | Status: AC

## 2022-04-01 MED ORDER — CEPHALEXIN 500 MG PO CAPS
500.0000 mg | ORAL_CAPSULE | Freq: Once | ORAL | Status: AC
  Administered 2022-04-01: 500 mg via ORAL
  Filled 2022-04-01: qty 1

## 2022-04-01 NOTE — ED Provider Notes (Signed)
[facility] Provider Note    Event Date/Time   First MD Initiated Contact with Patient 04/01/22 1652     (approximate)   History   Chief Complaint Shortness of Breath and Vaginal Bleeding   HPI  Lindsey [name] is a 86 y.o. female with past medical history of hypertension, CAD, CHF, bronchiectasis, COPD, DVT, and migraines who presents to the ED complaining of shortness of breath.  Patient reports that she has been feeling intermittently short of breath with exertion for about the past 2 weeks.  She states it has been present ever since she was admitted to the hospital for CHF exacerbation and NSTEMI.  She denies any difficulty breathing at rest, states it has been variable how far she has to walk to get out of breath but can be as little as through her house.  She has not had any pain in her chest with this and denies any fevers or cough.  She has not noticed any pain or swelling in her legs.  She has continued to take her medications as prescribed.  She additionally states that she has seen blood coming from her vaginal area but is unsure if this is coming from her urine or from the vagina itself.  She does report recent urinary urgency and frequency, denies dysuria, fever, or flank pain.     Physical Exam   Triage Vital Signs: ED Triage Vitals  Enc Vitals Group     BP 04/01/22 1542 119/69     Pulse Rate 04/01/22 1542 (!) 52     Resp 04/01/22 1542 16     Temp 04/01/22 1542 97.7 F (36.5 C)     Temp Source 04/01/22 1542 Oral     SpO2 04/01/22 1542 96 %     Weight 04/01/22 1543 122 lb (55.3 kg)     Height 04/01/22 1543 '5\' 2"'$  (1.575 m)     Head Circumference --      Peak Flow --      Pain Score 04/01/22 1543 2     Pain Loc --      Pain Edu? --      Excl. in [place]? --     Most recent vital signs: Vitals:   04/01/22 2000 04/01/22 2015  BP: (!) 159/105   Pulse: 69   Resp: 16   Temp:    SpO2: 92% 94%    Constitutional: Alert and oriented. Eyes:  Conjunctivae are normal. Head: Atraumatic. Nose: No congestion/rhinnorhea. Mouth/Throat: Mucous membranes are moist.  Cardiovascular: Normal rate, regular rhythm. Grossly normal heart sounds.  2+ radial pulses bilaterally. Respiratory: Normal respiratory effort.  No retractions. Lungs CTAB. Gastrointestinal: Soft and nontender. No distention. Musculoskeletal: No lower extremity tenderness nor edema.  Neurologic:  Normal speech and language. No gross focal neurologic deficits are appreciated.    ED Results / Procedures / Treatments   Labs (all labs ordered are listed, but only abnormal results are displayed) Labs Reviewed  BASIC METABOLIC PANEL - Abnormal; Notable for the following components:      Result Value   Sodium 133 (*)    Glucose, Bld 107 (*)    All other components within normal limits  URINALYSIS, ROUTINE W REFLEX MICROSCOPIC - Abnormal; Notable for the following components:   Color, Urine YELLOW (*)    APPearance CLOUDY (*)    Hgb urine dipstick LARGE (*)    Protein, ur 100 (*)    Leukocytes,Ua LARGE (*)    RBC /  HPF >50 (*)    WBC, UA >50 (*)    All other components within normal limits  TROPONIN I (HIGH SENSITIVITY) - Abnormal; Notable for the following components:   Troponin I (High Sensitivity) 23 (*)    All other components within normal limits  TROPONIN I (HIGH SENSITIVITY) - Abnormal; Notable for the following components:   Troponin I (High Sensitivity) 23 (*)    All other components within normal limits  URINE CULTURE  CBC     EKG  ED ECG REPORT I, Blake Divine, the attending physician, personally viewed and interpreted this ECG.   Date: 04/01/2022  EKG Time: 15:55  Rate: 67  Rhythm: normal sinus rhythm, PAC's noted, occasional PVC noted, unifocal  Axis: LAD  Intervals:right bundle branch block and left anterior fascicular block  ST&T Change: None  RADIOLOGY Chest x-ray reviewed and interpreted by me with no infiltrate, edema, or  effusion.  PROCEDURES:  Critical Care performed: No  Procedures   MEDICATIONS ORDERED IN ED: Medications  cephALEXin (KEFLEX) capsule 500 mg (500 mg Oral Given 04/01/22 2014)     IMPRESSION / MDM / ASSESSMENT AND PLAN / ED COURSE  I reviewed the triage vital signs and the nursing notes.                              86 y.o. female with past medical history of hypertension, CAD, CHF, COPD, bronchiectasis, DVT, and migraines who presents to the ED complaining of dyspnea on exertion for the past 2 weeks without any chest pain.  Additionally endorses bleeding coming from her vaginal area.  Patient's presentation is most consistent with acute presentation with potential threat to life or bodily function.  Differential diagnosis includes, but is not limited to, ACS, PE, pneumonia, pneumothorax, CHF exacerbation, COPD exacerbation, UTI, vaginal bleeding, kidney stone.  Patient well-appearing and in no acute distress, vital signs are unremarkable and she denies any symptoms here at rest in the ED.  EKG shows no evidence of arrhythmia or ischemia, initial troponin mildly elevated but much improved from her recent admission and stable on recheck.  Doubt PE given she is asymptomatic with rest.  Chest x-ray is unremarkable and additional labs are reassuring with no significant anemia, leukocytosis, electrolyte abnormality, or AKI.  Urinalysis does appear concerning for infection and suspect UTI as the source of her bleeding given she describes the blood as "watery" along with urinary symptoms of urgency and frequency.  We will send urine for culture and start patient on Keflex.  She is appropriate for discharge home with PCP follow-up for UTI and cardiology follow-up for dyspnea on exertion.  She was counseled to return to the ED for new or worsening symptoms, patient agrees with plan.      FINAL CLINICAL IMPRESSION(S) / ED DIAGNOSES   Final diagnoses:  Dyspnea on exertion  Acute cystitis with  hematuria     Rx / DC Orders   ED Discharge Orders          Ordered    cephALEXin (KEFLEX) 500 MG capsule  4 times daily        04/01/22 2033    Ambulatory referral to Cardiology        04/01/22 2037             Note:  This document was prepared using Dragon voice recognition software and may include unintentional dictation errors.   Blake Divine, MD 04/01/22 2043

## 2022-04-01 NOTE — ED Triage Notes (Signed)
Pt states that she was dropped off by a driver of the village of [place], pt reports that she had a MI a month ago and was having sob then, states that it hasn't gotten any better and when she becomes sob she loses bladder continence, states that she was also placed on plavix and is now having light vaginal bleeding

## 2022-04-01 NOTE — Progress Notes (Deleted)
Follow-up Outpatient Visit Date: 04/01/2022  Primary Care Provider: Tracie Harrier, [place] [address]  Primary Cardiologist: Esmond Plants, MD PhD  Chief Complaint: ***  HPI:  Lindsey [name] is a 86 y.o. female with history of atrial tachycardia, right lower extremity DVT status post IVC filter, bronchiectasis, labile hypertension, and lower extremity swelling, who presents urgent evaluation of presyncope and shortness of breath.  She was hospitalized last month with progressive leg edema and intermittent chest pain.  She had initially had severe chest pain a few weeks earlier while visiting [place].  However, she did not seek medical attention at that time.  On admission, she was found to be in acute heart failure.  Subsequent right and left heart catheterization showed occlusion of the mid LCx with collateralization.  Mildly-moderately elevated left heart filling pressures and moderate pulmonary hypertension were noted.  Cardiac output was normal.  LVEF was reduced at 40-45%.  She saw Darylene Price, NP, 3 weeks ago in follow-up.  She was felt to be euvolemic with NYHA class III symptoms at that time.  She is also scheduled to see Dr. Lanney Gins in the pulmonary clinic the next day.  --------------------------------------------------------------------------------------------------  Past Medical History:  Diagnosis Date   Anemia    Atrial tachycardia ([place])    Breast cancer ([place])    Bronchiectasis ([place]) 06/2007   CHF (congestive heart failure) (HCC)    Chronic Dizziness    Colitis    COPD (chronic obstructive pulmonary disease) ([place])    Depression    Diastolic dysfunction    a. 12/2019 Echo: EF 60-65%, no rwma, GrII DD. Nl RV size/fxn. Mild MR/AI.   DVT (deep venous thrombosis) ([place])    after her right knee surgery.    Fibromyalgia    Labile Hypertension    Migraine headache with aura    Osteoarthritis    Osteoporosis    PAD (peripheral  artery disease) ([place])    Pneumonia    Polio 1952   Stenosis of carotid artery    Past Surgical History:  Procedure Laterality Date   ABDOMINAL HYSTERECTOMY     BACK SURGERY     BILATERAL TOTAL MASTECTOMY WITH AXILLARY LYMPH NODE DISSECTION     BREAST SURGERY     CARPAL TUNNEL RELEASE     bilateral    COLONOSCOPY WITH PROPOFOL N/A 08/02/2015   Procedure: COLONOSCOPY WITH PROPOFOL;  Surgeon: Manya Silvas, MD;  Location: [facility] ENDOSCOPY;  Service: Endoscopy;  Laterality: N/A;   FINGER TENDON REPAIR     HERNIA REPAIR     REPLACEMENT TOTAL KNEE     right knee    RIGHT/LEFT HEART CATH AND CORONARY ANGIOGRAPHY N/A 03/03/2022   Procedure: RIGHT/LEFT HEART CATH AND CORONARY ANGIOGRAPHY;  Surgeon: Nelva Bush, MD;  Location: [place] CV LAB;  Service: Cardiovascular;  Laterality: N/A;   SQUAMOUS CELL CARCINOMA EXCISION     TONSILLECTOMY     TOTAL KNEE ARTHROPLASTY Left 09/19/2020   Procedure: TOTAL KNEE ARTHROPLASTY;  Surgeon: Hessie Knows, MD;  Location: [facility] ORS;  Service: Orthopedics;  Laterality: Left;   UMBILICAL HERNIA REPAIR     VAGINAL HYSTERECTOMY      No outpatient medications have been marked as taking for the 04/01/22 encounter (Appointment) with Edelmira Gallogly, Lindsey Gave, MD.    Allergies: Ivp dye [iodinated contrast media], Codeine, Cucumber extract, Demeclocycline, Sulfa antibiotics, Tegaderm ag mesh [silver], Telmisartan, and Tetracyclines & related  Social History   Tobacco Use   Smoking  status: Never   Smokeless tobacco: Never  Vaping Use   Vaping Use: Never used  Substance Use Topics   Alcohol use: Yes    Alcohol/week: 4.0 standard drinks of alcohol    Types: 4 Glasses of wine per week   Drug use: No    Family History  Problem Relation Age of Onset   Pancreatic cancer Mother    Arthritis Mother    Arthritis Father    Bladder Cancer Father    Atrial fibrillation Sister     Review of Systems: A 12-system review of systems was performed and was  negative except as noted in the HPI.  --------------------------------------------------------------------------------------------------  Physical Exam: There were no vitals taken for this visit.  General:  NAD. Neck: No JVD or HJR. Lungs: Clear to auscultation bilaterally without wheezes or crackles. Heart: Regular rate and rhythm without murmurs, rubs, or gallops. Abdomen: Soft, nontender, nondistended. Extremities: No lower extremity edema.  EKG:  ***  Lab Results  Component Value Date   WBC 6.9 03/04/2022   HGB 11.8 (L) 03/04/2022   HCT 36.8 03/04/2022   MCV 87.8 03/04/2022   PLT 252 03/04/2022    Lab Results  Component Value Date   NA 136 03/04/2022   K 3.9 03/04/2022   CL 99 03/04/2022   CO2 30 03/04/2022   BUN 23 03/04/2022   CREATININE 0.67 03/04/2022   GLUCOSE 105 (H) 03/04/2022   ALT 76 (H) 03/03/2022    Lab Results  Component Value Date   CHOL 186 03/02/2022   HDL 70 03/02/2022   LDLCALC 109 (H) 03/02/2022   TRIG 37 03/02/2022   CHOLHDL 2.7 03/02/2022    --------------------------------------------------------------------------------------------------  ASSESSMENT AND PLAN: Lindsey Gave Novah Goza, MD 04/01/2022 1:53 PM

## 2022-04-01 NOTE — Telephone Encounter (Signed)
Discussed with Dr. Saunders Revel. Called and spoke with pt and advised she go straight to the emergency room due to symptoms she is reporting. Pt states she has someone on their way now to drive her. Advised they drive her to the emergency room or she call 911. Pt voiced understanding and agreeable to recommendation. Pt states she will go to the ER at [facility]. Today's appointment cancelled.

## 2022-04-01 NOTE — Telephone Encounter (Signed)
Pt c/o Shortness Of Breath: STAT if SOB developed within the last 24 hours or pt is noticeably SOB on the phone  1. Are you currently SOB (can you hear that pt is SOB on the phone)? No  2. How long have you been experiencing SOB?  month  3. Are you SOB when sitting or when up moving around? Moving around  4. Are you currently experiencing any other symptoms?  Pt states that that she feels like she's going to pass out and tends to urinate on herself as well when this happens. Please advise

## 2022-04-01 NOTE — Telephone Encounter (Signed)
Called and spoke with patient   Patient reports that when she stands up and walks 5 or 6 steps, she will suddenly become very short of breath, and have to grab something to hold onto, as she feels like she's going to fall over, her vision starts to get blurry and she urinates on herself. States that this doesn't happen every time she stands up, however this has happened multiple times per day, and yesterday this happened several times earlier in the day and then 5 times late in the day. Has happened twice today.   Denies feeling like her heart is racing or skipping beats, but at times a "twinge" of pain. Denies any associated nausea. Reports that both hands and feet start tingling when this happens. Also reports that when this happens she sometimes feels her pulse pounding in her head.   Pt offered DOD slot with Dr. Saunders Revel at 3:20. Pt accepted and stated that she would work on finding a ride.   Advised patient to call us and let us know if she was unable to find a ride.

## 2022-04-03 LAB — URINE CULTURE: Culture: <10000 — AB

## 2022-04-03 NOTE — Progress Notes (Unsigned)
Cardiology Office Note  Date:  04/06/2022   ID:  Lindsey [name], Lindsey [name] November 15, 1935, MRN [id number]  PCP:  Tracie Harrier, MD   Chief Complaint  Patient presents with   Follow-up    [facility] hospital follow up  dyspnea on exertion    HPI:  Lindsey [name] is a pleasant 86 year old woman with history of  lower extremity swelling,Secondary to venous insufficiency  prior right knee surgery,  right DVT, status post IVC filter,  bronchiectasis on inhalers and nebulizers at home  Back surgery who presents for follow up of her labile blood pressure and her leg swelling.  Last seen in clinic by myself April 2023  Seen in the Er 04/01/22 for SOB intermittently short of breath with exertion for about the past 2 weeks Acute cystitis with hematuria, treated with ABX  Cardiac cath 03/03/22 Severe single vessel coronary artery disease with occlusion of mid LCx with left-to-left and right-to-left collaterals supplying distal OM branches.  Given patient's history of chest pain ~3 weeks ago following by progressive heart failure symptoms, I suspect her initial coronary event occurred at that time. Mild-moderate, non-obstructive CAD involving the LAD and RCA. Mildly-moderately elevated left heart filling pressures. Moderate pulmonary hypertension. Normal Fick cardiac output/index.   Recommendations: aspirin and clopidogrel for 12 months. No role for PCI to occluded LCx at this time, as event likely occurred ~3 weeks ago.  Echo 03/04/22  1. Left ventricular ejection fraction, by estimation, is 40 to 45%. The  left ventricle has mildly decreased function. The left ventricle  demonstrates regional wall motion abnormalities There is mild left  ventricular hypertrophy. Left ventricular diastolic parameters are  consistent with Grade III diastolic dysfunction (restrictive). There is  moderate hypokinesis of the left ventricular, basal-mid inferolateral wall.   2. Right ventricular systolic function is low  normal. The right ventricular size is normal.   3. The mitral valve was not well visualized. Mild mitral valve regurgitation.   4. The aortic valve was not well visualized. Aortic valve regurgitation is mild.   5. The inferior vena cava is dilated in size with <50% respiratory variability, suggesting right atrial pressure of 15 mmHg.   Lab work reviewed Total cholesterol 165 LDL 74  Started therapy/PT, not walking SOB on exertion Started PT but has not gone back, went several times  BP better at home 140/80  Followed by pulmonary Has received oxygen, has not been using it  EKG personally reviewed by myself on todays visit Normal sinus rhythm rate 62 bpm PACs, right bundle branch block, left anterior fascicular block  Other past medical history reviewed [facility] ED on 10/15/21 for evaluation of dizziness.  weakness and disorientation, her legs felt very heavy, and she was not sure if she could take the next step.  There was no evidence of arrhythmia or ischemia on cardiac monitor with the exception of frequent PVCs. Orthostatic VS were unremarkable. Hs troponins were within normal range  Echocardiogram November 24, 2021 Reviewed on today's visit Normal LV function Normal RV function No significant valve disease  Zio monitor reviewed Patient had a min HR of 40 bpm, max HR of 143 bpm, and avg HR of 55 bpm. Predominant underlying rhythm was Sinus Rhythm. 5 Supraventricular Tachycardia runs occurred, the run with  the fastest interval lasting 10 beats with a max rate of 143 bpm (avg 136 bpm); the run with the fastest interval was also the longest.  Supraventricular Tachycardia / Atrial Tachycardia Isolated SVEs  were frequent (12.9%, 63016), SVE  Couplets were rare (<1.0%, 1059), and SVE Triplets were rare (<1.0%, 17).  Isolated VEs were rare (<1.0%), and no VE Couplets or VE Triplets were present.  Off telmistartan, stopped , "Causes dizziness"  has carotid disease done through  [facility], was told she had moderate disease   PMH:   has a past medical history of Anemia, Atrial tachycardia ([place]), Breast cancer ([place]), Bronchiectasis ([place]) (06/2007), CHF (congestive heart failure) ([place]), Chronic Dizziness, Colitis, COPD (chronic obstructive pulmonary disease) ([place]), Depression, Diastolic dysfunction, DVT (deep venous thrombosis) ([place]), Fibromyalgia, Labile Hypertension, Migraine headache with aura, Osteoarthritis, Osteoporosis, PAD (peripheral artery disease) ([place]), Pneumonia, Polio (1952), and Stenosis of carotid artery.  PSH:    Past Surgical History:  Procedure Laterality Date   ABDOMINAL HYSTERECTOMY     BACK SURGERY     BILATERAL TOTAL MASTECTOMY WITH AXILLARY LYMPH NODE DISSECTION     BREAST SURGERY     CARPAL TUNNEL RELEASE     bilateral    COLONOSCOPY WITH PROPOFOL N/A 08/02/2015   Procedure: COLONOSCOPY WITH PROPOFOL;  Surgeon: Manya Silvas, MD;  Location: [facility] ENDOSCOPY;  Service: Endoscopy;  Laterality: N/A;   FINGER TENDON REPAIR     HERNIA REPAIR     REPLACEMENT TOTAL KNEE     right knee    RIGHT/LEFT HEART CATH AND CORONARY ANGIOGRAPHY N/A 03/03/2022   Procedure: RIGHT/LEFT HEART CATH AND CORONARY ANGIOGRAPHY;  Surgeon: Nelva Bush, MD;  Location: [facility];  Service: Cardiovascular;  Laterality: N/A;   SQUAMOUS CELL CARCINOMA EXCISION     TONSILLECTOMY     TOTAL KNEE ARTHROPLASTY Left 09/19/2020   Procedure: TOTAL KNEE ARTHROPLASTY;  Surgeon: Hessie Knows, MD;  Location: [facility] ORS;  Service: Orthopedics;  Laterality: Left;   UMBILICAL HERNIA REPAIR     VAGINAL HYSTERECTOMY      Current Outpatient Medications on File Prior to Visit  Medication Sig Dispense Refill   ARIPiprazole (ABILIFY) 2 MG tablet Take 2 mg by mouth daily.      aspirin EC 81 MG tablet Take 1 tablet (81 mg total) by mouth daily. Swallow whole. 30 tablet 12   atorvastatin (LIPITOR) 40 MG tablet Take 1 tablet (40 mg total) by mouth daily. 90 tablet 1   bisoprolol  (ZEBETA) 5 MG tablet Take 1 tablet (5 mg total) by mouth daily. 90 tablet 3   cephALEXin (KEFLEX) 500 MG capsule Take 1 capsule (500 mg total) by mouth 4 (four) times daily for 7 days. 28 capsule 0   clopidogrel (PLAVIX) 75 MG tablet Take 1 tablet (75 mg total) by mouth daily with breakfast. 90 tablet 1   cyclobenzaprine (FLEXERIL) 5 MG tablet Take 5 mg by mouth at bedtime as needed.     furosemide (LASIX) 40 MG tablet Take 1 tablet (40 mg total) by mouth daily. 30 tablet 1   hydrOXYzine (ATARAX/VISTARIL) 25 MG tablet Take 25 mg by mouth as needed for anxiety.     losartan (COZAAR) 25 MG tablet Take 1 tablet (25 mg total) by mouth daily. 90 tablet 1   polyethylene glycol (MIRALAX / GLYCOLAX) packet Take 17 g by mouth daily as needed for mild constipation or moderate constipation.      potassium chloride SA (KLOR-CON M) 20 MEQ tablet Take 1 tablet (20 mEq total) by mouth 2 (two) times daily. 30 tablet 1   sodium chloride HYPERTONIC 3 % nebulizer solution Take 5 mLs by nebulization every 4 (four) hours as needed.     SYMBICORT 160-4.5 MCG/ACT inhaler  SMARTSIG:2 Inhalation Via Inhaler Twice Daily     venlafaxine XR (EFFEXOR-XR) 150 MG 24 hr capsule Take 150 mg by mouth daily.     vitamin B-6 (PYRIDOXINE) 25 MG tablet Take 2.5 mg by mouth daily.     Multiple Vitamins-Minerals (PRESERVISION AREDS 2 PO) Take by mouth. (Patient not taking: Reported on 04/06/2022)     NIACIN PO Take 10 mg by mouth. (Patient not taking: Reported on 04/06/2022)     rizatriptan (MAXALT) 5 MG tablet Take 5 mg by mouth as needed for migraine. As needed. Needed 3 times in past 3 months (Patient not taking: Reported on 04/06/2022)     No current facility-administered medications on file prior to visit.    Allergies:   Ivp dye [iodinated contrast media], Charentais melon (french melon), Codeine, Cucumber extract, Demeclocycline, Sulfa antibiotics, Tegaderm ag mesh [silver], Telmisartan, Tetracyclines & related, and Wild lettuce  extract (lactuca virosa)   Social History:  The patient  reports that she has never smoked. She has never used smokeless tobacco. She reports current alcohol use of about 4.0 standard drinks of alcohol per week. She reports that she does not use drugs.   Family History:   family history includes Arthritis in her father and mother; Atrial fibrillation in her sister; Bladder Cancer in her father; Pancreatic cancer in her mother.    Review of Systems: Review of Systems  Constitutional: Negative.   HENT: Negative.    Respiratory:  Positive for shortness of breath.   Cardiovascular: Negative.   Gastrointestinal: Negative.   Musculoskeletal:        Leg weakness  Neurological:  Positive for dizziness.  Psychiatric/Behavioral: Negative.    All other systems reviewed and are negative.   PHYSICAL EXAM: VS:  BP (!) 150/88 (BP Location: Left Arm, Patient Position: Sitting, Cuff Size: Normal)   Pulse 62   Ht '5\' 2"'$  (1.575 m)   Wt 127 lb 9.6 oz (57.9 kg)   SpO2 97%   BMI 23.34 kg/m  , BMI Body mass index is 23.34 kg/m.  Constitutional:  oriented to person, place, and time. No distress.  HENT:  Head: Grossly normal Eyes:  no discharge. No scleral icterus.  Neck: No JVD, no carotid bruits  Cardiovascular: Regular rate and rhythm, no murmurs appreciated Pulmonary/Chest: Clear to auscultation bilaterally, no wheezes or rails Abdominal: Soft.  no distension.  no tenderness.  Musculoskeletal: Normal range of motion Neurological:  normal muscle tone. Coordination normal. No atrophy Skin: Skin warm and dry Psychiatric: normal affect, pleasant   Recent Labs: 03/01/2022: B Natriuretic Peptide 1,740.8 03/03/2022: ALT 76 04/01/2022: BUN 20; Creatinine, Ser 0.83; Hemoglobin 13.4; Platelets 239; Potassium 3.7; Sodium 133    Lipid Panel Lab Results  Component Value Date   CHOL 186 03/02/2022   HDL 70 03/02/2022   LDLCALC 109 (H) 03/02/2022   TRIG 37 03/02/2022    Wt Readings from Last 3  Encounters:  04/06/22 127 lb 9.6 oz (57.9 kg)  04/01/22 122 lb (55.3 kg)  03/11/22 126 lb 6 oz (57.3 kg)     ASSESSMENT AND PLAN:  Coronary artery disease with stable angina Catheterization May 2023, occluded circumflex with collaterals Stay on Lipitor 40, Add Zetia 10 Recommend she restart cardiac rehab  Labile blood pressure Blood pressure is well controlled on today's visit. No changes made to the medications.  History of DVT (deep vein thrombosis) No recurrent episodes, stable  Bronchiectasis without complication (HCC) Having chronic shortness of breath, suggest she try her oxygen  Has been treated with previous courses of steroids and antibiotics Followed by pulmonary  Leg swelling taking Lasix 20 mg, lab work stable No leg edema  PAD/carotid disease Goal LDL less than 70 On statin adding Zetia  Atrial tachycardia Continue bisoprolol 5 mg daily,  Denies tachycardia Prior Zio monitor  no significant symptoms    Total encounter time more than 40 minutes  Greater than 50% was spent in counseling and coordination of care with the patient   No orders of the defined types were placed in this encounter.     Signed, Esmond Plants, M.D., Ph.D. 04/06/2022  [place], [place]

## 2022-04-06 ENCOUNTER — Ambulatory Visit (INDEPENDENT_AMBULATORY_CARE_PROVIDER_SITE_OTHER): Payer: Medicare Other | Admitting: Cardiovascular Disease

## 2022-04-06 ENCOUNTER — Encounter: Payer: Self-pay | Admitting: Cardiovascular Disease

## 2022-04-06 VITALS — BP 150/88 | HR 62 | Ht 62.0 in | Wt 127.6 lb

## 2022-04-06 DIAGNOSIS — I471 Supraventricular tachycardia: Secondary | ICD-10-CM

## 2022-04-06 DIAGNOSIS — I25708 Atherosclerosis of coronary artery bypass graft(s), unspecified, with other forms of angina pectoris: Secondary | ICD-10-CM | POA: Diagnosis not present

## 2022-04-06 DIAGNOSIS — I10 Essential (primary) hypertension: Secondary | ICD-10-CM | POA: Diagnosis not present

## 2022-04-06 DIAGNOSIS — I739 Peripheral vascular disease, unspecified: Secondary | ICD-10-CM

## 2022-04-06 DIAGNOSIS — I5022 Chronic systolic (congestive) heart failure: Secondary | ICD-10-CM

## 2022-04-06 DIAGNOSIS — I6529 Occlusion and stenosis of unspecified carotid artery: Secondary | ICD-10-CM

## 2022-04-06 DIAGNOSIS — J449 Chronic obstructive pulmonary disease, unspecified: Secondary | ICD-10-CM

## 2022-04-06 DIAGNOSIS — M7989 Other specified soft tissue disorders: Secondary | ICD-10-CM

## 2022-04-06 DIAGNOSIS — R531 Weakness: Secondary | ICD-10-CM

## 2022-04-06 DIAGNOSIS — R0989 Other specified symptoms and signs involving the circulatory and respiratory systems: Secondary | ICD-10-CM

## 2022-04-06 DIAGNOSIS — R0609 Other forms of dyspnea: Secondary | ICD-10-CM

## 2022-04-06 MED ORDER — EZETIMIBE 10 MG PO TABS: 10.0000 mg | ORAL_TABLET | Freq: Every day | ORAL | 0 refills | Status: DC

## 2022-04-06 NOTE — Patient Instructions (Addendum)
Cardiac rehab at [facility]  Medication Instructions:   Please start zetia 10 mg daily - A new Rx has been sent to your pharmacy  If you need a refill on your cardiac medications before your next appointment, please call your pharmacy.   Lab work: No new labs needed  Testing/Procedures: No new testing needed  Follow-Up: At [facility], you and your health needs are our priority.  As part of our continuing mission to provide you with exceptional heart care, we have created designated Provider Care Teams.  These Care Teams include your primary Cardiologist (physician) and Advanced Practice Providers (APPs -  Physician Assistants and Nurse Practitioners) who all work together to provide you with the care you need, when you need it.  You will need a follow up appointment in 3 months  Providers on your designated Care Team:   Murray Hodgkins, NP Christell Faith, PA-C Cadence Kathlen Mody, PA-C   Please call cardiac rehab to set up next appointment: [phone]   COVID-19 Vaccine Information can be found at: [email or web address] For questions related to vaccine distribution or appointments, please email [email or web address] or call [phone].

## 2022-04-10 ENCOUNTER — Ambulatory Visit: Payer: Medicare Other | Admitting: Medical

## 2022-04-11 ENCOUNTER — Inpatient Hospital Stay
Admission: EM | Admit: 2022-04-11 | Discharge: 2022-04-14 | DRG: 291 | Disposition: A | Discharge status: Skilled Nursing Facility | Payer: Medicare Other | Attending: Internal Medicine | Admitting: Internal Medicine

## 2022-04-11 ENCOUNTER — Other Ambulatory Visit: Payer: Self-pay

## 2022-04-11 ENCOUNTER — Emergency Department: Payer: Medicare Other

## 2022-04-11 DIAGNOSIS — Z853 Personal history of malignant neoplasm of breast: Secondary | ICD-10-CM

## 2022-04-11 DIAGNOSIS — I2510 Atherosclerotic heart disease of native coronary artery without angina pectoris: Secondary | ICD-10-CM | POA: Diagnosis present

## 2022-04-11 DIAGNOSIS — I110 Hypertensive heart disease with heart failure: Principal | ICD-10-CM | POA: Diagnosis present

## 2022-04-11 DIAGNOSIS — R0602 Shortness of breath: Secondary | ICD-10-CM | POA: Diagnosis not present

## 2022-04-11 DIAGNOSIS — Z86718 Personal history of other venous thrombosis and embolism: Secondary | ICD-10-CM

## 2022-04-11 DIAGNOSIS — R5381 Other malaise: Secondary | ICD-10-CM | POA: Diagnosis present

## 2022-04-11 DIAGNOSIS — R0902 Hypoxemia: Secondary | ICD-10-CM

## 2022-04-11 DIAGNOSIS — Z20822 Contact with and (suspected) exposure to covid-19: Secondary | ICD-10-CM | POA: Diagnosis present

## 2022-04-11 DIAGNOSIS — Z95828 Presence of other vascular implants and grafts: Secondary | ICD-10-CM

## 2022-04-11 DIAGNOSIS — N393 Stress incontinence (female) (male): Secondary | ICD-10-CM | POA: Diagnosis present

## 2022-04-11 DIAGNOSIS — F419 Anxiety disorder, unspecified: Secondary | ICD-10-CM | POA: Diagnosis present

## 2022-04-11 DIAGNOSIS — Z7902 Long term (current) use of antithrombotics/antiplatelets: Secondary | ICD-10-CM

## 2022-04-11 DIAGNOSIS — I739 Peripheral vascular disease, unspecified: Secondary | ICD-10-CM | POA: Diagnosis present

## 2022-04-11 DIAGNOSIS — Z9861 Coronary angioplasty status: Secondary | ICD-10-CM

## 2022-04-11 DIAGNOSIS — I878 Other specified disorders of veins: Secondary | ICD-10-CM | POA: Diagnosis present

## 2022-04-11 DIAGNOSIS — M797 Fibromyalgia: Secondary | ICD-10-CM | POA: Diagnosis present

## 2022-04-11 DIAGNOSIS — Z96653 Presence of artificial knee joint, bilateral: Secondary | ICD-10-CM | POA: Diagnosis present

## 2022-04-11 DIAGNOSIS — J479 Bronchiectasis, uncomplicated: Secondary | ICD-10-CM

## 2022-04-11 DIAGNOSIS — I5043 Acute on chronic combined systolic (congestive) and diastolic (congestive) heart failure: Secondary | ICD-10-CM | POA: Diagnosis present

## 2022-04-11 DIAGNOSIS — Z7982 Long term (current) use of aspirin: Secondary | ICD-10-CM

## 2022-04-11 DIAGNOSIS — J9601 Acute respiratory failure with hypoxia: Secondary | ICD-10-CM | POA: Diagnosis present

## 2022-04-11 DIAGNOSIS — J449 Chronic obstructive pulmonary disease, unspecified: Secondary | ICD-10-CM | POA: Diagnosis present

## 2022-04-11 DIAGNOSIS — Z800 Family history of malignant neoplasm of digestive organs: Secondary | ICD-10-CM

## 2022-04-11 DIAGNOSIS — Z79899 Other long term (current) drug therapy: Secondary | ICD-10-CM

## 2022-04-11 DIAGNOSIS — Z8052 Family history of malignant neoplasm of bladder: Secondary | ICD-10-CM

## 2022-04-11 DIAGNOSIS — M810 Age-related osteoporosis without current pathological fracture: Secondary | ICD-10-CM | POA: Diagnosis present

## 2022-04-11 DIAGNOSIS — Z90710 Acquired absence of both cervix and uterus: Secondary | ICD-10-CM

## 2022-04-11 DIAGNOSIS — F32A Depression, unspecified: Secondary | ICD-10-CM | POA: Diagnosis present

## 2022-04-11 DIAGNOSIS — J471 Bronchiectasis with (acute) exacerbation: Principal | ICD-10-CM

## 2022-04-11 LAB — CBC WITH DIFFERENTIAL/PLATELET
Abs Immature Granulocytes: 0.04 10*3/uL (ref 0.00–0.07)
Basophils Absolute: 0.1 10*3/uL (ref 0.0–0.1)
Basophils Relative: 1 %
Eosinophils Absolute: 0.6 10*3/uL — ABNORMAL HIGH (ref 0.0–0.5)
Eosinophils Relative: 6 %
HCT: 39.2 % (ref 36.0–46.0)
Hemoglobin: 12.6 g/dL (ref 12.0–15.0)
Immature Granulocytes: 0 %
Lymphocytes Relative: 16 %
Lymphs Abs: 1.5 10*3/uL (ref 0.7–4.0)
MCH: 28.3 pg (ref 26.0–34.0)
MCHC: 32.1 g/dL (ref 30.0–36.0)
MCV: 88.1 fL (ref 80.0–100.0)
Monocytes Absolute: 0.7 10*3/uL (ref 0.1–1.0)
Monocytes Relative: 8 %
Neutro Abs: 6.1 10*3/uL (ref 1.7–7.7)
Neutrophils Relative %: 69 %
Platelets: 242 10*3/uL (ref 150–400)
RBC: 4.45 MIL/uL (ref 3.87–5.11)
RDW: 14.3 % (ref 11.5–15.5)
WBC: 9 10*3/uL (ref 4.0–10.5)
nRBC: 0 % (ref 0.0–0.2)

## 2022-04-11 MED ORDER — DOXYCYCLINE HYCLATE 100 MG PO TABS
100.0000 mg | ORAL_TABLET | Freq: Once | ORAL | Status: AC
  Administered 2022-04-11: 100 mg via ORAL
  Filled 2022-04-11: qty 1

## 2022-04-11 MED ORDER — METHYLPREDNISOLONE SODIUM SUCC 125 MG IJ SOLR
125.0000 mg | Freq: Once | INTRAMUSCULAR | Status: AC
  Administered 2022-04-11: 125 mg via INTRAVENOUS
  Filled 2022-04-11: qty 2

## 2022-04-11 MED ORDER — IPRATROPIUM-ALBUTEROL 0.5-2.5 (3) MG/3ML IN SOLN
6.0000 mL | Freq: Once | RESPIRATORY_TRACT | Status: AC
  Administered 2022-04-11: 6 mL via RESPIRATORY_TRACT
  Filled 2022-04-11: qty 3

## 2022-04-11 NOTE — ED Triage Notes (Signed)
Pt arrives via [facility] from home with CC of SOB. Pt has had 4 episodes of hypoxia today. Pt had cardiac event in May of 2023 and has had SOB since. Due to upcoming vacation in [place], pt was recently prescribed oxygen to take with her and use while on vacation for altitude sickness. Pt does not use oxygen chronically. Pt lives outside of [facility]  EMS vital signs: 164/169 CBG 109

## 2022-04-11 NOTE — ED Provider Notes (Signed)
[facility] Provider Note    Event Date/Time   First MD Initiated Contact with Patient 04/11/22 2257     (approximate)   History   Shortness of Breath   HPI  Lindsey [name] is a 86 y.o. female who presents to the ED for evaluation of Shortness of Breath   I review 7/3 cards clinic visit. DVT s/p IVC filter. LHC on 5/30 with severe single vessel disease, subacute total occlusion managed medically. Moderate pulmHTN and EF 40%.  DAPT w plavix.  I review 6/30 pulm visit.  Bronchiectasis and COPD.  Patient presents to the ED for evaluation of dyspnea on exertion.  She reports monitoring her O2 sats occasionally at home with a finger pulse oximeter and with minimal exertion, walking to the door to close it in her house, she reports developing hypoxia at 82%.  Dyspnea on exertion resolved with rest.  No chest pain or pressure.  She does report increased cough with increased sputum production as well.  No abdominal pain, emesis, syncope or falls.  No fever.  Patient reports finishing her 1 week of Keflex antibiotics yesterday but reports concern that she may still have a UTI.  Reports stress incontinence with her coughing.   Physical Exam   Triage Vital Signs: ED Triage Vitals  Enc Vitals Group     BP 04/11/22 2241 (!) 177/100     Pulse Rate 04/11/22 2241 82     Resp 04/11/22 2241 19     Temp 04/11/22 2246 97.9 F (36.6 C)     Temp Source 04/11/22 2246 Oral     SpO2 04/11/22 2241 (!) 88 %     Weight 04/11/22 2242 160 lb (72.6 kg)     Height 04/11/22 2242 '5\' 2"'$  (1.575 m)     Head Circumference --      Peak Flow --      Pain Score 04/11/22 2242 0     Pain Loc --      Pain Edu? --      Excl. in [place]? --     Most recent vital signs: Vitals:   04/12/22 0130 04/12/22 0230  BP: 134/75 (!) 154/84  Pulse: (!) 55 70  Resp: 20 13  Temp:    SpO2: 98% 96%    General: Awake, no distress.  Sitting upright in bed, pleasant and conversational in full  sentences. CV:  Good peripheral perfusion.  Resp:  Minimal tachypnea to the low 20s.  No distress.  And expiratory wheezes throughout with slight decreased airflow.  Faint bibasilar crackles symmetrically. Abd:  No distention.  Soft and benign MSK:  No deformity noted.  Trace pitting edema to bilateral ankles without overlying skin changes Neuro:  No focal deficits appreciated. Other:     ED Results / Procedures / Treatments   Labs (all labs ordered are listed, but only abnormal results are displayed) Labs Reviewed  COMPREHENSIVE METABOLIC PANEL - Abnormal; Notable for the following components:      Result Value   Sodium 134 (*)    BUN 28 (*)    Total Protein 6.4 (*)    AST 95 (*)    ALT 80 (*)    All other components within normal limits  CBC WITH DIFFERENTIAL/PLATELET - Abnormal; Notable for the following components:   Eosinophils Absolute 0.6 (*)    All other components within normal limits  BRAIN NATRIURETIC PEPTIDE - Abnormal; Notable for the following components:   B Natriuretic Peptide 985.2 (*)  All other components within normal limits  URINALYSIS, ROUTINE W REFLEX MICROSCOPIC - Abnormal; Notable for the following components:   Color, Urine STRAW (*)    APPearance HAZY (*)    Hgb urine dipstick MODERATE (*)    Leukocytes,Ua LARGE (*)    WBC, UA >50 (*)    Bacteria, UA RARE (*)    All other components within normal limits  TROPONIN I (HIGH SENSITIVITY) - Abnormal; Notable for the following components:   Troponin I (High Sensitivity) 32 (*)    All other components within normal limits  TROPONIN I (HIGH SENSITIVITY) - Abnormal; Notable for the following components:   Troponin I (High Sensitivity) 30 (*)    All other components within normal limits  SARS CORONAVIRUS 2 BY RT PCR  URINE CULTURE    EKG Sinus rhythm with a rate of 70 bpm.  Sinus arrhythmia.  1 PVC.  Normal axis.  Right bundle.  No STEMI.  RADIOLOGY 2 view CXR interpreted by me with cardiomegaly  and pulm vascular congestion without discrete infiltrate  Official radiology report(s): DG Chest 2 View  Result Date: 04/11/2022 CLINICAL DATA:  Shortness of breath EXAM: CHEST - 2 VIEW COMPARISON:  04/01/2022 FINDINGS: Increased cardiomegaly with vascular congestion and diffuse interstitial and mild ground-glass opacities suspicious for pulmonary edema. Potential tiny pleural effusions. Aortic atherosclerosis. No pneumothorax. Treated compression deformity of the thoracic spine. Breast prostheses IMPRESSION: Cardiomegaly with vascular congestion and diffuse bilateral interstitial and ground-glass opacity suspicious for pulmonary edema. Electronically Signed   By: Donavan Foil M.D.   On: 04/11/2022 23:46    PROCEDURES and INTERVENTIONS:  .1-3 Lead EKG Interpretation  Performed by: Vladimir Crofts, MD Authorized by: Vladimir Crofts, MD     Interpretation: normal     ECG rate:  70   ECG rate assessment: normal     Rhythm: sinus rhythm     Ectopy: none     Conduction: normal   .Critical Care  Performed by: Vladimir Crofts, MD Authorized by: Vladimir Crofts, MD   Critical care provider statement:    Critical care time (minutes):  30   Critical care time was exclusive of:  Separately billable procedures and treating other patients   Critical care was necessary to treat or prevent imminent or life-threatening deterioration of the following conditions:  Respiratory failure   Critical care was time spent personally by me on the following activities:  Development of treatment plan with patient or surrogate, discussions with consultants, evaluation of patient's response to treatment, examination of patient, ordering and review of laboratory studies, ordering and review of radiographic studies, ordering and performing treatments and interventions, pulse oximetry, re-evaluation of patient's condition and review of old charts   Medications  ipratropium-albuterol (DUONEB) 0.5-2.5 (3) MG/3ML nebulizer solution 6  mL (6 mLs Nebulization Given 04/11/22 2352)  methylPREDNISolone sodium succinate (SOLU-MEDROL) 125 mg/2 mL injection 125 mg (125 mg Intravenous Given 04/11/22 2350)  doxycycline (VIBRA-TABS) tablet 100 mg (100 mg Oral Given 04/11/22 2356)  furosemide (LASIX) injection 60 mg (60 mg Intravenous Given 04/12/22 0133)     IMPRESSION / MDM / [place] / ED COURSE  I reviewed the triage vital signs and the nursing notes.  Differential diagnosis includes, but is not limited to, CHF exacerbation, bronchiectasis exacerbation, ACS, PTX, pneumonia, sepsis  {Patient presents with symptoms of an acute illness or injury that is potentially life-threatening.  Atrial female presents to the ED with worsening dyspnea on exertion, with evidence of hypoxia due to CHF  exacerbation and bronchiectasis exacerbation requiring medical admission.  She is asymptomatic and okay on room air while supine, but as soon as she gets up to ambulate she quickly desaturates, even after breathing treatments and initiating diuresis.  Blood work without leukocytosis or signs of sepsis.  Her BNP is elevated and CXR is congested concerning for CHF exacerbation.  Exam is most consistent with bronchiectasis exacerbation.  Her troponins are marginally elevated and flat, doubt ACS.  Urine does have large leukocytes, but similar symptoms as when she was seen a couple weeks ago and treated with Keflex.  This urine culture did not show any particular organisms, so we will culture this urine and abstain from focused antibiotics for UTI.. Initiated steroids, doxycycline course and breathing treatments for bronchiectasis. We will consult with medicine for admission PE less likely due to her IVC filter in place.   Clinical Course as of 04/12/22 0341  Sun Apr 12, 2022  0048 Reassessed.  Patient laying flat and reports feeling well while supine.  We discussed work-up overall.  I reassessed her lungs and the wheezing has resolved.  We discussed  ambulation with pulse oximetry and second troponin to help with disposition.  She is agreeable. [DS]  2376 With getting up to try to ambulate, patient desats to 86% on room air and feels too dyspneic to even ambulate.  I recommended admission and she is agreeable. [DS]    Clinical Course User Index [DS] Vladimir Crofts, MD     FINAL CLINICAL IMPRESSION(S) / ED DIAGNOSES   Final diagnoses:  Bronchiectasis with acute exacerbation ([place])  Acute on chronic combined systolic and diastolic congestive heart failure (HCC)  SOB (shortness of breath)  Hypoxia     Rx / DC Orders   ED Discharge Orders     None        Note:  This document was prepared using Dragon voice recognition software and may include unintentional dictation errors.   Vladimir Crofts, MD 04/12/22 [phone]

## 2022-04-12 DIAGNOSIS — Z800 Family history of malignant neoplasm of digestive organs: Secondary | ICD-10-CM | POA: Diagnosis not present

## 2022-04-12 DIAGNOSIS — Z20822 Contact with and (suspected) exposure to covid-19: Secondary | ICD-10-CM | POA: Diagnosis present

## 2022-04-12 DIAGNOSIS — Z7982 Long term (current) use of aspirin: Secondary | ICD-10-CM | POA: Diagnosis not present

## 2022-04-12 DIAGNOSIS — F419 Anxiety disorder, unspecified: Secondary | ICD-10-CM | POA: Diagnosis present

## 2022-04-12 DIAGNOSIS — I509 Heart failure, unspecified: Secondary | ICD-10-CM | POA: Insufficient documentation

## 2022-04-12 DIAGNOSIS — I2510 Atherosclerotic heart disease of native coronary artery without angina pectoris: Secondary | ICD-10-CM

## 2022-04-12 DIAGNOSIS — Z90710 Acquired absence of both cervix and uterus: Secondary | ICD-10-CM | POA: Diagnosis not present

## 2022-04-12 DIAGNOSIS — R0602 Shortness of breath: Secondary | ICD-10-CM | POA: Diagnosis present

## 2022-04-12 DIAGNOSIS — I739 Peripheral vascular disease, unspecified: Secondary | ICD-10-CM | POA: Diagnosis present

## 2022-04-12 DIAGNOSIS — M797 Fibromyalgia: Secondary | ICD-10-CM | POA: Diagnosis present

## 2022-04-12 DIAGNOSIS — Z86718 Personal history of other venous thrombosis and embolism: Secondary | ICD-10-CM | POA: Diagnosis not present

## 2022-04-12 DIAGNOSIS — I878 Other specified disorders of veins: Secondary | ICD-10-CM | POA: Diagnosis present

## 2022-04-12 DIAGNOSIS — N393 Stress incontinence (female) (male): Secondary | ICD-10-CM | POA: Diagnosis present

## 2022-04-12 DIAGNOSIS — Z95828 Presence of other vascular implants and grafts: Secondary | ICD-10-CM | POA: Diagnosis not present

## 2022-04-12 DIAGNOSIS — M810 Age-related osteoporosis without current pathological fracture: Secondary | ICD-10-CM | POA: Diagnosis present

## 2022-04-12 DIAGNOSIS — J449 Chronic obstructive pulmonary disease, unspecified: Secondary | ICD-10-CM | POA: Diagnosis present

## 2022-04-12 DIAGNOSIS — J9601 Acute respiratory failure with hypoxia: Secondary | ICD-10-CM | POA: Diagnosis present

## 2022-04-12 DIAGNOSIS — Z9861 Coronary angioplasty status: Secondary | ICD-10-CM | POA: Diagnosis not present

## 2022-04-12 DIAGNOSIS — Z8052 Family history of malignant neoplasm of bladder: Secondary | ICD-10-CM | POA: Diagnosis not present

## 2022-04-12 DIAGNOSIS — I110 Hypertensive heart disease with heart failure: Secondary | ICD-10-CM | POA: Diagnosis present

## 2022-04-12 DIAGNOSIS — R5381 Other malaise: Secondary | ICD-10-CM | POA: Diagnosis present

## 2022-04-12 DIAGNOSIS — Z79899 Other long term (current) drug therapy: Secondary | ICD-10-CM | POA: Diagnosis not present

## 2022-04-12 DIAGNOSIS — Z853 Personal history of malignant neoplasm of breast: Secondary | ICD-10-CM | POA: Diagnosis not present

## 2022-04-12 DIAGNOSIS — J479 Bronchiectasis, uncomplicated: Secondary | ICD-10-CM | POA: Diagnosis not present

## 2022-04-12 DIAGNOSIS — Z96653 Presence of artificial knee joint, bilateral: Secondary | ICD-10-CM | POA: Diagnosis present

## 2022-04-12 DIAGNOSIS — F32A Depression, unspecified: Secondary | ICD-10-CM | POA: Diagnosis present

## 2022-04-12 DIAGNOSIS — I5043 Acute on chronic combined systolic (congestive) and diastolic (congestive) heart failure: Secondary | ICD-10-CM | POA: Diagnosis present

## 2022-04-12 LAB — COMPREHENSIVE METABOLIC PANEL
ALT: 80 U/L — ABNORMAL HIGH (ref 0–44)
AST: 95 U/L — ABNORMAL HIGH (ref 15–41)
Albumin: 3.8 g/dL (ref 3.5–5.0)
Alkaline Phosphatase: 76 U/L (ref 38–126)
Anion gap: 6 (ref 5–15)
BUN: 28 mg/dL — ABNORMAL HIGH (ref 8–23)
CO2: 24 mmol/L (ref 22–32)
Calcium: 8.9 mg/dL (ref 8.9–10.3)
Chloride: 104 mmol/L (ref 98–111)
Creatinine, Ser: 0.63 mg/dL (ref 0.44–1.00)
GFR, Estimated: >60 mL/min (ref >60)
Glucose, Bld: 93 mg/dL (ref 70–99)
Potassium: 4.1 mmol/L (ref 3.5–5.1)
Sodium: 134 mmol/L — ABNORMAL LOW (ref 135–145)
Total Bilirubin: 0.8 mg/dL (ref 0.3–1.2)
Total Protein: 6.4 g/dL — ABNORMAL LOW (ref 6.5–8.1)

## 2022-04-12 LAB — CBC
HCT: 37.5 % (ref 36.0–46.0)
Hemoglobin: 12.1 g/dL (ref 12.0–15.0)
MCH: 28.4 pg (ref 26.0–34.0)
MCHC: 32.3 g/dL (ref 30.0–36.0)
MCV: 88 fL (ref 80.0–100.0)
Platelets: 220 10*3/uL (ref 150–400)
RBC: 4.26 MIL/uL (ref 3.87–5.11)
RDW: 14.2 % (ref 11.5–15.5)
WBC: 6.6 10*3/uL (ref 4.0–10.5)
nRBC: 0 % (ref 0.0–0.2)

## 2022-04-12 LAB — URINALYSIS, ROUTINE W REFLEX MICROSCOPIC
Bilirubin Urine: NEGATIVE
Glucose, UA: NEGATIVE mg/dL
Ketones, ur: NEGATIVE mg/dL
Nitrite: NEGATIVE
Protein, ur: NEGATIVE mg/dL
Specific Gravity, Urine: 1.006 (ref 1.005–1.030)
WBC, UA: >50 WBC/hpf — ABNORMAL HIGH (ref 0–5)
pH: 6 (ref 5.0–8.0)

## 2022-04-12 LAB — CREATININE, SERUM
Creatinine, Ser: 0.61 mg/dL (ref 0.44–1.00)
GFR, Estimated: >60 mL/min (ref >60)

## 2022-04-12 LAB — BRAIN NATRIURETIC PEPTIDE: B Natriuretic Peptide: 985.2 pg/mL — ABNORMAL HIGH (ref 0.0–100.0)

## 2022-04-12 LAB — SARS CORONAVIRUS 2 BY RT PCR: SARS Coronavirus 2 by RT PCR: NEGATIVE

## 2022-04-12 LAB — TROPONIN I (HIGH SENSITIVITY)
Troponin I (High Sensitivity): 32 ng/L — ABNORMAL HIGH (ref <18)
Troponin I (High Sensitivity): 30 ng/L — ABNORMAL HIGH (ref <18)

## 2022-04-12 MED ORDER — ALBUTEROL SULFATE (2.5 MG/3ML) 0.083% IN NEBU
2.5000 mg | INHALATION_SOLUTION | RESPIRATORY_TRACT | Status: DC | PRN
  Filled 2022-04-12: qty 3

## 2022-04-12 MED ORDER — ACETAMINOPHEN 650 MG RE SUPP: 650.0000 mg | Freq: Four times a day (QID) | RECTAL | Status: DC | PRN

## 2022-04-12 MED ORDER — ACETAMINOPHEN 325 MG PO TABS: 650.0000 mg | ORAL_TABLET | Freq: Four times a day (QID) | ORAL | Status: DC | PRN

## 2022-04-12 MED ORDER — FUROSEMIDE 10 MG/ML IJ SOLN
40.0000 mg | Freq: Two times a day (BID) | INTRAMUSCULAR | Status: DC
Start: 2022-04-12 — End: 2022-04-12
  Administered 2022-04-12: 40 mg via INTRAVENOUS
  Filled 2022-04-12: qty 4

## 2022-04-12 MED ORDER — IPRATROPIUM-ALBUTEROL 0.5-2.5 (3) MG/3ML IN SOLN
3.0000 mL | Freq: Four times a day (QID) | RESPIRATORY_TRACT | Status: DC
  Administered 2022-04-12 – 2022-04-13 (×6): 3 mL via RESPIRATORY_TRACT
  Filled 2022-04-12 (×7): qty 3

## 2022-04-12 MED ORDER — ARIPIPRAZOLE 2 MG PO TABS
2.0000 mg | ORAL_TABLET | Freq: Every day | ORAL | Status: DC
  Administered 2022-04-12 – 2022-04-14 (×3): 2 mg via ORAL
  Filled 2022-04-12 (×4): qty 1

## 2022-04-12 MED ORDER — POTASSIUM CHLORIDE CRYS ER 10 MEQ PO TBCR
10.0000 meq | EXTENDED_RELEASE_TABLET | Freq: Every day | ORAL | Status: DC
  Administered 2022-04-12 – 2022-04-14 (×3): 10 meq via ORAL
  Filled 2022-04-12 (×3): qty 1

## 2022-04-12 MED ORDER — ATORVASTATIN CALCIUM 20 MG PO TABS
40.0000 mg | ORAL_TABLET | Freq: Every day | ORAL | Status: DC
  Administered 2022-04-12 – 2022-04-14 (×3): 40 mg via ORAL
  Filled 2022-04-12 (×4): qty 2

## 2022-04-12 MED ORDER — FUROSEMIDE 10 MG/ML IJ SOLN
40.0000 mg | Freq: Every day | INTRAMUSCULAR | Status: DC
  Administered 2022-04-13 – 2022-04-14 (×2): 40 mg via INTRAVENOUS
  Filled 2022-04-12 (×2): qty 4

## 2022-04-12 MED ORDER — METHYLPREDNISOLONE SODIUM SUCC 40 MG IJ SOLR: 40.0000 mg | Freq: Two times a day (BID) | INTRAMUSCULAR | Status: DC

## 2022-04-12 MED ORDER — ENOXAPARIN SODIUM 40 MG/0.4ML IJ SOSY
40.0000 mg | PREFILLED_SYRINGE | INTRAMUSCULAR | Status: DC
  Administered 2022-04-12 – 2022-04-14 (×3): 40 mg via SUBCUTANEOUS
  Filled 2022-04-12 (×3): qty 0.4

## 2022-04-12 MED ORDER — BISOPROLOL FUMARATE 5 MG PO TABS
5.0000 mg | ORAL_TABLET | Freq: Every day | ORAL | Status: DC
  Administered 2022-04-12 – 2022-04-14 (×3): 5 mg via ORAL
  Filled 2022-04-12 (×3): qty 1

## 2022-04-12 MED ORDER — ONDANSETRON HCL 4 MG/2ML IJ SOLN: 4.0000 mg | Freq: Four times a day (QID) | INTRAMUSCULAR | Status: DC | PRN

## 2022-04-12 MED ORDER — VENLAFAXINE HCL ER 75 MG PO CP24
150.0000 mg | ORAL_CAPSULE | Freq: Every day | ORAL | Status: DC
  Administered 2022-04-12 – 2022-04-14 (×3): 150 mg via ORAL
  Filled 2022-04-12: qty 2
  Filled 2022-04-12 (×2): qty 1

## 2022-04-12 MED ORDER — SODIUM CHLORIDE 3 % IN NEBU: 5.0000 mL | INHALATION_SOLUTION | RESPIRATORY_TRACT | Status: DC | PRN

## 2022-04-12 MED ORDER — LOSARTAN POTASSIUM 25 MG PO TABS
25.0000 mg | ORAL_TABLET | Freq: Every day | ORAL | Status: DC
  Administered 2022-04-12 – 2022-04-14 (×3): 25 mg via ORAL
  Filled 2022-04-12 (×3): qty 1

## 2022-04-12 MED ORDER — FUROSEMIDE 10 MG/ML IJ SOLN
40.0000 mg | Freq: Every evening | INTRAMUSCULAR | Status: AC
  Administered 2022-04-12: 40 mg via INTRAVENOUS
  Filled 2022-04-12: qty 4

## 2022-04-12 MED ORDER — SODIUM CHLORIDE 0.9 % IV SOLN
1.0000 g | INTRAVENOUS | Status: DC
  Administered 2022-04-12: 1 g via INTRAVENOUS
  Filled 2022-04-12: qty 10

## 2022-04-12 MED ORDER — ASPIRIN 81 MG PO TBEC
81.0000 mg | DELAYED_RELEASE_TABLET | Freq: Every day | ORAL | Status: DC
  Administered 2022-04-12 – 2022-04-14 (×3): 81 mg via ORAL
  Filled 2022-04-12 (×3): qty 1

## 2022-04-12 MED ORDER — EZETIMIBE 10 MG PO TABS
10.0000 mg | ORAL_TABLET | Freq: Every day | ORAL | Status: DC
  Administered 2022-04-12 – 2022-04-14 (×3): 10 mg via ORAL
  Filled 2022-04-12 (×3): qty 1

## 2022-04-12 MED ORDER — FUROSEMIDE 10 MG/ML IJ SOLN
60.0000 mg | Freq: Once | INTRAMUSCULAR | Status: AC
  Administered 2022-04-12: 60 mg via INTRAVENOUS
  Filled 2022-04-12: qty 8

## 2022-04-12 MED ORDER — PREDNISONE 20 MG PO TABS: 40.0000 mg | ORAL_TABLET | Freq: Every day | ORAL | Status: DC

## 2022-04-12 MED ORDER — ONDANSETRON HCL 4 MG PO TABS: 4.0000 mg | ORAL_TABLET | Freq: Four times a day (QID) | ORAL | Status: DC | PRN

## 2022-04-12 NOTE — Assessment & Plan Note (Signed)
Acute venous thromboembolism not suspected at this time

## 2022-04-12 NOTE — Assessment & Plan Note (Signed)
Continue venlafaxine 

## 2022-04-12 NOTE — H&P (Signed)
History and Physical    Patient: Lindsey [name]:[id number] DOB: 1936/05/17 DOA: 04/11/2022 DOS: the patient was seen and examined on 04/12/2022 PCP: Tracie Harrier, MD  Patient coming from: ALF/ILF  Chief Complaint:  Chief Complaint  Patient presents with   Shortness of Breath    HPI: Lindsey [name] is a 86 y.o. 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 Rockey Situ on 04/06/2022, who presents to the ED by EMS with shortness of breath and occasional hypoxia at home.  Patient is not currently on oxygen but monitors her O2 sat with a pulse ox..  She also reports increased cough and sputum production.  She recently completed a 1 week course of Keflex for UTI.  She denies fever or chills ED course and data review: Afebrile with pulse 82, BP 177/100 and O2 sat 88% on room air requiring 2 L to maintain sats in the high 90s Labs troponin 30 and BNP 985.  CBC and CMP mostly unremarkable except for slightly elevated AST/ALT of 95/80.  Urinalysis with large leukocyte esterase EKG, personally viewed and interpreted with sinus at 70 with no acute ST-T wave changes Chest x-ray showing cardiomegaly with vascular congestion and diffuse bilateral interstitial and groundglass opacities suspicious for pulmonary edema  Patient treated with DuoNebs and Solu-Medrol and doxycycline for possible bronchiectasis flare and given a dose of Lasix for CHF exacerbation.  Hospitalist consulted for admission.     Past Medical History:  Diagnosis Date   Anemia    Atrial tachycardia (HCC)    Breast cancer (HCC)    Bronchiectasis ([place]) 06/2007   CHF (congestive heart failure) (HCC)    Chronic Dizziness    Colitis    COPD (chronic obstructive pulmonary  disease) (HCC)    Depression    Diastolic dysfunction    a. 12/2019 Echo: EF 60-65%, no rwma, GrII DD. Nl RV size/fxn. Mild MR/AI.   DVT (deep venous thrombosis) ([place])    after her right knee surgery.    Fibromyalgia    Labile Hypertension    Migraine headache with aura    Osteoarthritis    Osteoporosis    PAD (peripheral artery disease) ([place])    Pneumonia    [place] 1952   Stenosis of carotid artery    Past Surgical History:  Procedure Laterality Date   ABDOMINAL HYSTERECTOMY     BACK SURGERY     BILATERAL TOTAL MASTECTOMY WITH AXILLARY LYMPH NODE DISSECTION     BREAST SURGERY     CARPAL TUNNEL RELEASE     bilateral    COLONOSCOPY WITH PROPOFOL N/A 08/02/2015   Procedure: COLONOSCOPY WITH PROPOFOL;  Surgeon: Manya Silvas, MD;  Location: [facility] ENDOSCOPY;  Service: Endoscopy;  Laterality: N/A;   FINGER TENDON REPAIR     HERNIA REPAIR     REPLACEMENT TOTAL KNEE     right knee    RIGHT/LEFT HEART CATH AND CORONARY ANGIOGRAPHY N/A 03/03/2022   Procedure: RIGHT/LEFT HEART CATH AND CORONARY ANGIOGRAPHY;  Surgeon: Nelva Bush, MD;  Location: [place] CV LAB;  Service: Cardiovascular;  Laterality: N/A;   SQUAMOUS CELL CARCINOMA EXCISION     TONSILLECTOMY     TOTAL KNEE ARTHROPLASTY Left 09/19/2020   Procedure: TOTAL KNEE ARTHROPLASTY;  Surgeon: Hessie Knows, MD;  Location: [facility] ORS;  Service: Orthopedics;  Laterality: Left;   UMBILICAL HERNIA REPAIR     VAGINAL HYSTERECTOMY     Social History:  reports that she has never smoked. She has never used smokeless tobacco. She reports current alcohol use of about 4.0 standard drinks of alcohol per week. She reports that she does not use drugs.  Allergies  Allergen Reactions   Ivp Dye [Iodinated Contrast Media] Anaphylaxis   Charentais Melon (French Melon) Other (See Comments)    All melons cause stomach pain and upset   Codeine Other (See Comments)    Dizziness  Dizziness   Cucumber Extract    Demeclocycline Other (See  Comments)    Stomach upset    Galcanezumab-Gnlm Itching   Sulfa Antibiotics Hives   Tegaderm Ag Mesh [Silver]    Telmisartan     dizziness Other reaction(s): Dizziness dizziness   Tetracyclines & Related     Stomach upset    Wild Lettuce Extract (Lactuca Virosa) Other (See Comments)    Stomach upset and pain    Family History  Problem Relation Age of Onset   Pancreatic cancer Mother    Arthritis Mother    Arthritis Father    Bladder Cancer Father    Atrial fibrillation Sister     Prior to Admission medications   Medication Sig Start Date End Date Taking? Authorizing Provider  acetaminophen (TYLENOL) 500 MG tablet Take 500 mg by mouth at bedtime as needed for moderate pain.   Yes [provider]  albuterol (VENTOLIN HFA) 108 (90 Base) MCG/ACT inhaler Inhale 2 puffs into the lungs every 6 (six) hours as needed for wheezing or shortness of breath.   Yes [provider]  ARIPiprazole (ABILIFY) 2 MG tablet Take 2 mg by mouth daily.    Yes [provider]  aspirin EC 81 MG tablet Take 1 tablet (81 mg total) by mouth daily. Swallow whole. 03/05/22  Yes Mercy Riding, MD  atorvastatin (LIPITOR) 40 MG tablet Take 1 tablet (40 mg total) by mouth daily. 03/05/22  Yes Mercy Riding, MD  bisoprolol (ZEBETA) 5 MG tablet Take 1 tablet (5 mg total) by mouth daily. 09/16/21  Yes Minna Merritts, MD  clopidogrel (PLAVIX) 75 MG tablet Take 1 tablet (75 mg total) by mouth daily with breakfast. 03/05/22  Yes Gonfa, Charlesetta Ivory, MD  cyclobenzaprine (FLEXERIL) 5 MG tablet Take 5 mg by mouth at bedtime as needed. 04/14/21  Yes [provider]  ezetimibe (ZETIA) 10 MG tablet Take 1 tablet (10 mg total) by mouth daily. 04/06/22 07/05/22 Yes Gollan, Kathlene November, MD  furosemide (LASIX) 40 MG tablet Take 1 tablet (40 mg total) by mouth daily. 03/05/22 05/04/22 Yes Mercy Riding, MD  hydrOXYzine (ATARAX/VISTARIL) 25 MG tablet Take 25 mg by mouth as needed for anxiety.   Yes [provider]  losartan (COZAAR) 25 MG tablet Take 1 tablet (25 mg total) by mouth daily. 03/05/22  Yes Mercy Riding, MD  Multiple Vitamins-Minerals (PRESERVISION AREDS 2 PO) Take by mouth.   Yes [provider]  Omega-3 Fatty Acids (FISH OIL OMEGA-3 PO) Take 2 capsules by mouth at bedtime.   Yes [provider]  polyethylene glycol (MIRALAX / GLYCOLAX) packet Take 17 g by mouth daily as needed for mild constipation or moderate constipation.    Yes [provider]  potassium chloride (KLOR-CON M) 10 MEQ tablet Take 10 mEq by mouth daily. 04/10/22  Yes [provider]  rizatriptan (MAXALT) 5 MG tablet Take 5 mg by mouth as needed for migraine. As needed. Needed 3 times in past 3 months 01/16/20  Yes [provider]  sodium chloride HYPERTONIC 3 % nebulizer solution Take 5 mLs by nebulization every 4 (four) hours as needed. 02/09/22  Yes [provider]  SYMBICORT 160-4.5 MCG/ACT inhaler SMARTSIG:2 Inhalation Via Inhaler Twice Daily 08/21/21  Yes [provider]  venlafaxine XR (EFFEXOR-XR) 150 MG 24 hr capsule Take 150 mg by mouth daily. 02/02/22  Yes [provider]  vitamin B-6 (PYRIDOXINE) 25 MG tablet Take 2.5 mg by mouth daily.   Yes [provider]  cephALEXin (KEFLEX) 500 MG capsule Take 500 mg by mouth 4 (four) times daily. Patient not taking: Reported on 04/12/2022 04/10/22   [provider]  NIACIN PO Take 10 mg by mouth. Patient not taking: Reported on 04/06/2022    [provider]  potassium chloride SA (KLOR-CON M) 20 MEQ tablet Take 1 tablet (20 mEq total) by mouth 2 (two) times daily. Patient not taking: Reported on 04/12/2022 03/04/22   Mercy Riding, MD    Physical Exam: Vitals:   04/11/22 2300 04/12/22 0100 04/12/22 0130 04/12/22 0230  BP: (!) 165/104 (!) 155/66 134/75 (!) 154/84  Pulse: 63 75 (!) 55 70  Resp: '15 20 20 13  '$ Temp:      TempSrc:      SpO2: 98% 95% 98% 96%  Weight:       Height:       Physical Exam Vitals and nursing note reviewed.  Constitutional:      General: She is not in acute distress. HENT:     Head: Normocephalic and atraumatic.  Cardiovascular:     Rate and Rhythm: Normal rate and regular rhythm.     Heart sounds: Normal heart sounds.  Pulmonary:     Effort: Pulmonary effort is normal.     Breath sounds: Rales present.  Abdominal:     Palpations: Abdomen is soft.     Tenderness: There is no abdominal tenderness.  Neurological:     Mental Status: Mental status is at baseline.     Labs on Admission: I have personally reviewed following labs and imaging studies  CBC: Recent Labs  Lab 04/11/22 2243  WBC 9.0  NEUTROABS 6.1  HGB 12.6  HCT 39.2  MCV 88.1  PLT 950   Basic Metabolic Panel: Recent Labs  Lab 04/11/22 2243  NA 134*  K 4.1  CL 104  CO2 24  GLUCOSE 93  BUN 28*  CREATININE 0.63  CALCIUM 8.9   GFR: Estimated Creatinine Clearance: 49.7 mL/min (by C-G formula based on SCr of 0.63 mg/dL). Liver Function Tests: Recent Labs  Lab 04/11/22 2243  AST 95*  ALT 80*  ALKPHOS 76  BILITOT 0.8  PROT 6.4*  ALBUMIN 3.8   No results for input(s): "LIPASE", "AMYLASE" in the last 168 hours. No results for input(s): "AMMONIA" in the last 168 hours. Coagulation Profile: No results for input(s): "INR", "PROTIME" in the last 168 hours. Cardiac Enzymes: No results for input(s): "CKTOTAL", "CKMB", "CKMBINDEX", "TROPONINI" in the last 168 hours. BNP (last 3 results) No results for input(s): "PROBNP" in the last 8760 hours. HbA1C: No results for input(s): "HGBA1C" in the last 72 hours. CBG: No results for input(s): "GLUCAP" in the last 168 hours. Lipid Profile: No results for input(s): "CHOL", "HDL", "LDLCALC", "TRIG", "CHOLHDL", "LDLDIRECT" in the last 72 hours. Thyroid Function Tests: No results for input(s): "  TSH", "T4TOTAL", "FREET4", "T3FREE", "THYROIDAB" in the last 72 hours. Anemia Panel: No results for input(s):  "VITAMINB12", "FOLATE", "FERRITIN", "TIBC", "IRON", "RETICCTPCT" in the last 72 hours. Urine analysis:    Component Value Date/Time   COLORURINE STRAW (A) 04/11/2022 2243   APPEARANCEUR HAZY (A) 04/11/2022 2243   LABSPEC 1.006 04/11/2022 2243   PHURINE 6.0 04/11/2022 2243   GLUCOSEU NEGATIVE 04/11/2022 2243   HGBUR MODERATE (A) 04/11/2022 2243   BILIRUBINUR NEGATIVE 04/11/2022 2243   KETONESUR NEGATIVE 04/11/2022 2243   PROTEINUR NEGATIVE 04/11/2022 2243   NITRITE NEGATIVE 04/11/2022 2243   LEUKOCYTESUR LARGE (A) 04/11/2022 2243    Radiological Exams on Admission: DG Chest 2 View  Result Date: 04/11/2022 CLINICAL DATA:  Shortness of breath EXAM: CHEST - 2 VIEW COMPARISON:  04/01/2022 FINDINGS: Increased cardiomegaly with vascular congestion and diffuse interstitial and mild ground-glass opacities suspicious for pulmonary edema. Potential tiny pleural effusions. Aortic atherosclerosis. No pneumothorax. Treated compression deformity of the thoracic spine. Breast prostheses IMPRESSION: Cardiomegaly with vascular congestion and diffuse bilateral interstitial and ground-glass opacity suspicious for pulmonary edema. Electronically Signed   By: Donavan Foil M.D.   On: 04/11/2022 23:46     Data Reviewed: Relevant notes from primary care and specialist visits, past discharge summaries as available in EHR, including Care Everywhere. Prior diagnostic testing as pertinent to current admission diagnoses Updated medications and problem lists for reconciliation ED course, including vitals, labs, imaging, treatment and response to treatment Triage notes, nursing and pharmacy notes and ED provider's notes Notable results as noted in HPI   Assessment and Plan: * Acute respiratory failure with hypoxia ([place]) Suspect secondary to combination of CHF and bronchiectasis flare Acute PE not suspected as patient has IVC filter and history and test results support a diagnosis of CHF and  bronchiectasis Supplemental oxygen to keep sats over 92% Treat acute etiologies as outlined  Acute on chronic combined CHF BNP over 900 and chest x-ray consistent with failure IV Lasix Continue losartan and bisoprolol Daily weights with intake and output monitoring Patient had echo on 5/31 that showed EF 40 to 45% and grade 3 diastolic dysfunction   CAD S/P percutaneous coronary angioplasty No complaints of chest pain Had left heart cath 03/04/2022 that showed severe single-vessel CAD of left circumflex with collaterals being treated medically Continue aspirin, atorvastatin, bisoprolol Patient follows with [facility] Dr. Rockey Situ  Bronchiectasis ([place]) Continue DuoNebs, Solu-Medrol and Rocephin Antitussives, supplemental oxygen and supportive care  PAD (peripheral artery disease) ([place]) Continue aspirin and atorvastatin  Anxiety and depression Continue venlafaxine  S/P IVC filter Acute venous thromboembolism not suspected at this time        DVT prophylaxis: Lovenox  Consults: none  Advance Care Planning:   Code Status: Prior   Family Communication: none  Disposition Plan: Back to previous home environment  Severity of Illness: The appropriate patient status for this patient is INPATIENT. Inpatient status is judged to be reasonable and necessary in order to provide the required intensity of service to ensure the patient's safety. The patient's presenting symptoms, physical exam findings, and initial radiographic and laboratory data in the context of their chronic comorbidities is felt to place them at high risk for further clinical deterioration. Furthermore, it is not anticipated that the patient will be medically stable for discharge from the hospital within 2 midnights of admission.   * I certify that at the point of admission it is my clinical judgment that the patient will require inpatient hospital care spanning beyond 2 midnights  from the point of admission due to high  intensity of service, high risk for further deterioration and high frequency of surveillance required.*  Author: Athena Masse, MD 04/12/2022 5:03 AM  For on call review [email or web address].

## 2022-04-12 NOTE — Assessment & Plan Note (Signed)
Continue DuoNebs, Solu-Medrol and Rocephin Antitussives, supplemental oxygen and supportive care

## 2022-04-12 NOTE — Progress Notes (Signed)
[facility] PLAN OF CARE NOTE Patient: Lindsey [name] QRF:[id number]   PCP: Tracie Harrier, MD DOB: 1936/02/08   DOA: 04/11/2022   DOS: 04/12/2022    Patient was admitted by my colleague earlier on 04/12/2022. I have reviewed the H&P as well as assessment and plan and agree with the same. Important changes in the plan are listed below.  Plan of care: Principal Problem:   Acute respiratory failure with hypoxia ([place]) Active Problems:   Acute on chronic combined systolic and diastolic CHF (congestive heart failure) (HCC)   CAD S/P percutaneous coronary angioplasty   Bronchiectasis (HCC)   S/P IVC filter   Anxiety and depression   COPD with chronic bronchitis (HCC)   PAD (peripheral artery disease) ([place])  I suspect that the pt is suffering from acute on chronic combined systolic and diastolic CHF.  Her weight has increased from her baseline   03/02/22 03/03/22 03/11/22 14:13 04/01/22 04/06/22 09:18 04/11/22 04/12/22  Weight 58.6 kg 57.7 kg 57.3 kg 55.3 kg 57.9 kg 72.6 kg 59 kg  72.6 Kg is an error in documentation.    I do not think this is bronchiectasis flare up and therefor will stop the Antibiotics as well as steroids. Continue supportive measures for lung    Author: Berle Mull, MD [facility] 04/12/2022 1:11 PM   If 7PM-7AM, please contact night-coverage at [email or web address]

## 2022-04-12 NOTE — ED Notes (Signed)
Patient eating breakfast tray at this time. 

## 2022-04-12 NOTE — Assessment & Plan Note (Addendum)
Suspect secondary to combination of CHF and bronchiectasis flare Acute PE not suspected as patient has IVC filter and history and test results support a diagnosis of CHF and bronchiectasis Supplemental oxygen to keep sats over 92% Treat acute etiologies as outlined

## 2022-04-12 NOTE — Assessment & Plan Note (Signed)
BNP over 900 and chest x-ray consistent with failure IV Lasix Continue losartan and bisoprolol Daily weights with intake and output monitoring Patient had echo on 5/31 that showed EF 40 to 45% and grade 3 diastolic dysfunction

## 2022-04-12 NOTE — Assessment & Plan Note (Signed)
No complaints of chest pain Had left heart cath 03/04/2022 that showed severe single-vessel CAD of left circumflex with collaterals being treated medically Continue aspirin, atorvastatin, bisoprolol Patient follows with [facility] Dr. Rockey Situ

## 2022-04-12 NOTE — Assessment & Plan Note (Signed)
Continue aspirin and atorvastatin. ?

## 2022-04-13 ENCOUNTER — Telehealth: Payer: Self-pay | Admitting: Cardiovascular Disease

## 2022-04-13 DIAGNOSIS — J479 Bronchiectasis, uncomplicated: Secondary | ICD-10-CM

## 2022-04-13 DIAGNOSIS — J9601 Acute respiratory failure with hypoxia: Secondary | ICD-10-CM | POA: Diagnosis not present

## 2022-04-13 DIAGNOSIS — F419 Anxiety disorder, unspecified: Secondary | ICD-10-CM | POA: Diagnosis not present

## 2022-04-13 DIAGNOSIS — Z95828 Presence of other vascular implants and grafts: Secondary | ICD-10-CM

## 2022-04-13 DIAGNOSIS — F32A Depression, unspecified: Secondary | ICD-10-CM

## 2022-04-13 DIAGNOSIS — I5043 Acute on chronic combined systolic (congestive) and diastolic (congestive) heart failure: Secondary | ICD-10-CM | POA: Diagnosis not present

## 2022-04-13 DIAGNOSIS — I2510 Atherosclerotic heart disease of native coronary artery without angina pectoris: Secondary | ICD-10-CM

## 2022-04-13 DIAGNOSIS — Z9861 Coronary angioplasty status: Secondary | ICD-10-CM

## 2022-04-13 DIAGNOSIS — I739 Peripheral vascular disease, unspecified: Secondary | ICD-10-CM

## 2022-04-13 DIAGNOSIS — J449 Chronic obstructive pulmonary disease, unspecified: Secondary | ICD-10-CM

## 2022-04-13 LAB — COMPREHENSIVE METABOLIC PANEL
ALT: 54 U/L — ABNORMAL HIGH (ref 0–44)
AST: 66 U/L — ABNORMAL HIGH (ref 15–41)
Albumin: 3.6 g/dL (ref 3.5–5.0)
Alkaline Phosphatase: 62 U/L (ref 38–126)
Anion gap: 7 (ref 5–15)
BUN: 31 mg/dL — ABNORMAL HIGH (ref 8–23)
CO2: 28 mmol/L (ref 22–32)
Calcium: 9 mg/dL (ref 8.9–10.3)
Chloride: 103 mmol/L (ref 98–111)
Creatinine, Ser: 0.77 mg/dL (ref 0.44–1.00)
GFR, Estimated: >60 mL/min (ref >60)
Glucose, Bld: 103 mg/dL — ABNORMAL HIGH (ref 70–99)
Potassium: 3.5 mmol/L (ref 3.5–5.1)
Sodium: 138 mmol/L (ref 135–145)
Total Bilirubin: 0.8 mg/dL (ref 0.3–1.2)
Total Protein: 6 g/dL — ABNORMAL LOW (ref 6.5–8.1)

## 2022-04-13 LAB — CBC
HCT: 35.8 % — ABNORMAL LOW (ref 36.0–46.0)
Hemoglobin: 11.7 g/dL — ABNORMAL LOW (ref 12.0–15.0)
MCH: 28.7 pg (ref 26.0–34.0)
MCHC: 32.7 g/dL (ref 30.0–36.0)
MCV: 87.7 fL (ref 80.0–100.0)
Platelets: 230 10*3/uL (ref 150–400)
RBC: 4.08 MIL/uL (ref 3.87–5.11)
RDW: 14.3 % (ref 11.5–15.5)
WBC: 7.6 10*3/uL (ref 4.0–10.5)
nRBC: 0 % (ref 0.0–0.2)

## 2022-04-13 LAB — URINE CULTURE: Culture: <10000 — AB

## 2022-04-13 LAB — MAGNESIUM: Magnesium: 1.9 mg/dL (ref 1.7–2.4)

## 2022-04-13 NOTE — Evaluation (Signed)
Physical Therapy Evaluation Patient Details Name: Lindsey [name] MRN: [id number] DOB: 04/07/1936 Today's Date: 04/13/2022  History of Present Illness  Lindsey [name] is a 86 y.o. female with past medical history of bronchiectasis, COPD, chronic venous stasis, DVT status post IVC filter placement, CAD, CHF ejection fraction of 40 to 45% , recently seen by her cardiologist Dr. Rockey Situ on 04/06/2022, presented to hospital with shortness of breath and low pulse ox at home.  Patient stated dyspnea on exertion and leg swelling. In the ED patient had elevated blood pressure and pulse ox was 88% on room air requiring 2 L of oxygen to maintain oxygen in the 90s.    Clinical Impression  Pt received seated in recliner upon arrival to room and pt agreeable to therapy.  Pt performed seated exercises with good technique prior to ambulation attempt.  Pt notes that upon standing, she started to get winded although O2 saturation levels remain in high 90's.  Pt then ambulated with RW for support to the door and back to the recliner.  Pt typically does not require RW, and utilizes a walking stick for support at home.  Will attempt next session with SPC or less restrictive device.  Pt unstable without AD during OT session, so walker utilized.  Pt became SOB at end of ambulation to the doorway and back to room, with pt noting some panicking when she usually gets SOB.  Pt performed much better during this session than has been in previous attempts to stand and mobilize.  Will continue to monitor during hospital stay.  Current discharge plans to SNF are appropriate at this time due to lack of family support and inability to walk community distances without SOB.  Pt will continue to benefit from skilled therapy in order to address deficits listed below.      Recommendations for follow up therapy are one component of a multi-disciplinary discharge planning process, led by the attending physician.  Recommendations may be  updated based on patient status, additional functional criteria and insurance authorization.  Follow Up Recommendations Skilled nursing-short term rehab (<3 hours/day) Can patient physically be transported by private vehicle: Yes    Assistance Recommended at Discharge Frequent or constant Supervision/Assistance  Patient can return home with the following  A lot of help with walking and/or transfers;A lot of help with bathing/dressing/bathroom    Equipment Recommendations  (to be determined at next venue of care.)  Recommendations for Other Services       Functional Status Assessment Patient has had a recent decline in their functional status and demonstrates the ability to make significant improvements in function in a reasonable and predictable amount of time.     Precautions / Restrictions Precautions Precautions: Fall Restrictions Weight Bearing Restrictions: No      Mobility  Bed Mobility               General bed mobility comments: upright in recliner upon arrival.    Transfers Overall transfer level: Needs assistance Equipment used: Rolling walker (2 wheels) Transfers: Sit to/from Stand Sit to Stand: Min assist           General transfer comment: braces legs against bed to stand, MIN A for balance    Ambulation/Gait Ambulation/Gait assistance: Min guard Gait Distance (Feet): 22 Feet Assistive device: Rolling walker (2 wheels) Gait Pattern/deviations: WFL(Within Functional Limits) Gait velocity: decreased     General Gait Details: pt with slow, yet steady gait pattern towards the door and back to the  room.  Stairs            Wheelchair Mobility    Modified Rankin (Stroke Patients Only)       Balance Overall balance assessment: Needs assistance Sitting-balance support: No upper extremity supported, Feet supported Sitting balance-Leahy Scale: Good     Standing balance support: Single extremity supported, During functional  activity Standing balance-Leahy Scale: Fair                               Pertinent Vitals/Pain Pain Assessment Pain Assessment: No/denies pain    Home Living Family/patient expects to be discharged to:: Private residence Living Arrangements: Alone Available Help at Discharge: Neighbor Type of Home: Independent living facility Home Access: Level entry       Home Layout: One level Home Equipment: Rollator (4 wheels);Cane - single point;Shower seat      Prior Function Prior Level of Function : Independent/Modified Independent;Driving             Mobility Comments: uses 4WW or SPC as needed, drives to dining at ILF bc it is uphill. ADLs Comments: does not cook     Hand Dominance        Extremity/Trunk Assessment   Upper Extremity Assessment Upper Extremity Assessment: Overall WFL for tasks assessed    Lower Extremity Assessment Lower Extremity Assessment: Generalized weakness       Communication   Communication: HOH  Cognition Arousal/Alertness: Awake/alert Behavior During Therapy: WFL for tasks assessed/performed Overall Cognitive Status: Within Functional Limits for tasks assessed                                          General Comments General comments (skin integrity, edema, etc.): SpO2 high 90s on RA t/o    Exercises Total Joint Exercises Ankle Circles/Pumps: AROM, Strengthening, Both, 10 reps, Seated Quad Sets: AROM, Strengthening, Both, 10 reps, Seated Gluteal Sets: AROM, Strengthening, Both, 10 reps, Seated Long Arc Quad: AROM, Strengthening, Both, 10 reps, Seated Marching in Standing: AROM, Strengthening, Both, 10 reps, Standing   Assessment/Plan    PT Assessment Patient needs continued PT services  PT Problem List Decreased strength;Decreased activity tolerance;Decreased balance;Decreased mobility       PT Treatment Interventions DME instruction;Gait training;Functional mobility training;Therapeutic  activities;Therapeutic exercise;Balance training;Neuromuscular re-education    PT Goals (Current goals can be found in the Care Plan section)  Acute Rehab PT Goals Patient Stated Goal: to get stronger. PT Goal Formulation: With patient/family Time For Goal Achievement: 04/27/22 Potential to Achieve Goals: Good    Frequency Min 2X/week     Co-evaluation               AM-PAC PT "6 Clicks" Mobility  Outcome Measure Help needed turning from your back to your side while in a flat bed without using bedrails?: A Little Help needed moving from lying on your back to sitting on the side of a flat bed without using bedrails?: A Little Help needed moving to and from a bed to a chair (including a wheelchair)?: A Little Help needed standing up from a chair using your arms (e.g., wheelchair or bedside chair)?: A Little Help needed to walk in hospital room?: A Little Help needed climbing 3-5 steps with a railing? : A Lot 6 Click Score: 17    End of Session Equipment Utilized During Treatment:  Gait belt Activity Tolerance: Patient tolerated treatment well Patient left: in chair;with call bell/phone within reach;with chair alarm set Nurse Communication: Mobility status PT Visit Diagnosis: Unsteadiness on feet (R26.81);Other abnormalities of gait and mobility (R26.89);Repeated falls (R29.6);Muscle weakness (generalized) (M62.81);History of falling (Z91.81);Difficulty in walking, not elsewhere classified (R26.2)    Time: 6773-7366 PT Time Calculation (min) (ACUTE ONLY): 20 min   Charges:   PT Evaluation $PT Eval Low Complexity: 1 Low PT Treatments $Therapeutic Exercise: 8-22 mins        Gwenlyn Saran, PT, DPT 04/13/22, 3:36 PM   Christie Nottingham 04/13/2022, 3:32 PM

## 2022-04-13 NOTE — Progress Notes (Signed)
Admission profile updated. ?

## 2022-04-13 NOTE — TOC Initial Note (Signed)
Transition of Care [facility]) - Initial/Assessment Note    Patient Details  Name: Lindsey [name] MRN: [id number] Date of Birth: 03-15-1936  Transition of Care [facility]) CM/SW Contact:    Shelbie Hutching, RN Phone Number: 04/13/2022, 10:00 AM  Clinical Narrative:                 Patient admitted for acute respiratory failure, recently s/p MI.  Patient is from [facility] [employer].  Received a call from Maudie Mercury over at [facility] saying that patient had reached out to her and wanted to go to the skilled unit at discharge.  Maudie Mercury reports that they will be happy to accept her, she has a Building surveyor, will just need [employer].   Expected Discharge Plan: Skilled Nursing Facility Barriers to Discharge: Continued Medical Work up   Patient Goals and CMS Choice Patient states their goals for this hospitalization and ongoing recovery are:: Patient called [facility] and wants to go to SNF at [facility].gov Compare Post Acute Care list provided to:: Patient Choice offered to / list presented to : Patient  Expected Discharge Plan and Services Expected Discharge Plan: [place] Choice: [place] Living arrangements for the past 2 months: [place]                 DME Arranged: N/A DME Agency: NA       HH Arranged: NA HH Agency: NA        Prior Living Arrangements/Services Living arrangements for the past 2 months: [place] Lives with:: Self Patient language and need for interpreter reviewed:: Yes Do you feel safe going back to the place where you live?: Yes      Need for Family Participation in Patient Care: Yes (Comment) Care giver support system in place?: Yes (comment)   Criminal Activity/Legal Involvement Pertinent to Current Situation/Hospitalization: No - Comment as needed  Activities of Daily Living      Permission Sought/Granted Permission sought to share  information with : Investment banker, corporate granted to share info w AGENCY: [place] [employer]        Emotional Assessment       Orientation: : Oriented to Self, Oriented to Place, Oriented to  Time, Oriented to Situation Alcohol / Substance Use: Not Applicable Psych Involvement: No (comment)  Admission diagnosis:  CHF (congestive heart failure) (HCC) [I50.9] Acute combined systolic and diastolic congestive heart failure (HCC) [I50.41] Patient Active Problem List   Diagnosis Date Noted   CHF (congestive heart failure) ([place]) 04/12/2022   PAD (peripheral artery disease) ([place])    Acute respiratory failure with hypoxia ([place])    CAD S/P percutaneous coronary angioplasty    Acute on chronic combined systolic and diastolic CHF (congestive heart failure) ([place]) 03/03/2022   Non-ST elevation (NSTEMI) myocardial infarction ([place]) 03/01/2022   Elevated liver function tests 03/01/2022   S/P TKR (total knee replacement) using cement, bilateral 09/19/2020   TIA (transient ischemic attack) 01/03/2020   Migraine headache with aura 01/03/2020   Uncontrolled hypertension 01/03/2020   Anxiety and depression 01/03/2020   COPD with chronic bronchitis ([place]) 01/03/2020   Hypokalemia 01/03/2020   SOB (shortness of breath) 04/30/2014   Leg swelling 04/30/2014   Bronchiectasis ([place]) 04/30/2014   History of DVT (deep vein thrombosis) 04/30/2014   Labile blood pressure 04/30/2014   S/P IVC filter 04/30/2014   History of  right knee surgery 04/30/2014   PCP:  Tracie Harrier, MD Pharmacy:   [facility], [place] - [address] [place] [address] Phone: [phone] Fax: [phone]     Social Determinants of Health (SDOH) Interventions    Readmission Risk Interventions     No data to display

## 2022-04-13 NOTE — ED Notes (Signed)
Informed RN bed assigned 

## 2022-04-13 NOTE — Telephone Encounter (Signed)
Patient called and says that she is in the hospital and wants Dr. Rockey Situ to come and see her. Please call back to verify

## 2022-04-13 NOTE — ED Notes (Signed)
Pt requested to be back on O2. Pt placed on 3LNC per her request.

## 2022-04-13 NOTE — Progress Notes (Signed)
PROGRESS NOTE    Lindsey [name]  DTO:[id number] DOB: 1936-05-28 DOA: 04/11/2022 PCP: Tracie Harrier, MD    Brief Narrative:   Lindsey [name] is a 86 y.o. female with past medical history of bronchiectasis, COPD, chronic venous stasis, DVT status post IVC filter placement, CAD, CHF ejection fraction of 40 to 45% , recently seen by her cardiologist Dr. Rockey Situ on 04/06/2022, presented to hospital with shortness of breath and low pulse ox at home.  Patient stated dyspnea on exertion and leg swelling.  Complain of cough with pinkish phlegm.  Denied any fever or chills.  In the ED patient had elevated blood pressure and pulse ox was 88% on room air requiring 2 L of oxygen to maintain oxygen in the 90s.  Labs troponin 30 and BNP 985.  CBC and CMP with elevated AST/ALT of 95/80.  Urinalysis with large leukocyte esterase. EKG without ST or T wave changes.  Chest x-ray showing cardiomegaly with vascular congestion and diffuse bilateral interstitial and groundglass opacities suspicious for pulmonary edema.  Patient was given DuoNeb Solu-Medrol doxycycline for possible bronchiectasis and given Lasix for CHF exacerbation.  Patient was then admitted hospital for further evaluation and treatment.    Assessment and Plan: Principal Problem:   Acute respiratory failure with hypoxia (HCC) Active Problems:   Acute on chronic combined systolic and diastolic CHF (congestive heart failure) (HCC)   CAD S/P percutaneous coronary angioplasty   Bronchiectasis (HCC)   S/P IVC filter   Anxiety and depression   COPD with chronic bronchitis (HCC)   PAD (peripheral artery disease) (HCC)   * Acute respiratory failure with hypoxia (HCC) Thought to be secondary to congestive heart failure.  Currently on room air.  Initially was hypoxic and required 2 L of oxygen.  Continue IV diuresis.   Acute on chronic combined systolic and diastolic CHF (congestive heart failure) (HCC) BNP over 900 and chest x-ray with vascular  congestion.  Continue IV Lasix 40 daily.  On oral Lasix at home. Continue losartan and bisoprolol.  Daily weights intake and output charting. 2D echocardiogram on 03/04/22 showed EF 40 to 45% and grade 3 diastolic dysfunction  CAD S/P percutaneous coronary angioplasty Denies any chest pain.  Follows up with cardiology as outpatient.  Recent heart cath on 03/04/2022 showed severe single-vessel CAD of left circumflex with collaterals being treated medically.  Continue aspirin Lipitor bisoprolol. Patient follows with [facility] Dr. Rockey Situ  History of bronchiectasis ([place]) Continue DuoNebs, Solu-Medrol.  Continue supportive care.  Patient does not have any productive cough at this time.  PAD (peripheral artery disease) (HCC) Continue aspirin and atorvastatin  Anxiety and depression Continue venlafaxine  S/P IVC filter.   No concern for thromboembolic phenomena.  Debility, weakness.  Difficulty taking care of herself at home.  Family requesting higher level of care.  We will get PT OT evaluation.  TOC has been consulted.   DVT prophylaxis: enoxaparin (LOVENOX) injection 40 mg Start: 04/12/22 0800   Code Status:     Code Status: Full Code  Disposition: ALF/skilled nursing facility  Status is: Inpatient  Remains inpatient appropriate because: Congestive heart failure exacerbation, IV diuretics, need for PT evaluation and possible skilled nursing facility.   Family Communication: Spoke with the patient's family at bedside.  Requesting higher level of care.  Patient is currently at independent living facility.  Consultants:  None  Procedures:  None  Antimicrobials:  None  Anti-infectives (From admission, onward)    Start     Dose/Rate Route  Frequency Ordered Stop   04/12/22 0515  cefTRIAXone (ROCEPHIN) 1 g in sodium chloride 0.9 % 100 mL IVPB  Status:  Discontinued        1 g 200 mL/hr over 30 Minutes Intravenous Every 24 hours 04/12/22 0514 04/12/22 0911   04/11/22 2330  doxycycline  (VIBRA-TABS) tablet 100 mg        100 mg Oral  Once 04/11/22 2328 04/11/22 2356       Subjective: Today, patient was seen and examined at bedside.  Complains of feeling little better with breathing.  Denies any nausea vomiting chest pain or palpitation.  Has dry cough.  No fever or chills.  Objective: Vitals:   04/13/22 0849 04/13/22 0850 04/13/22 0900 04/13/22 0915  BP:      Pulse:  66 82 72  Resp: '13 14 18 '$ (!) 21  Temp:      TempSrc:      SpO2: 94% 93% 95% 94%  Weight:      Height:        Intake/Output Summary (Last 24 hours) at 04/13/2022 1011 Last data filed at 04/12/2022 1500 Gross per 24 hour  Intake 240 ml  Output 1 ml  Net 239 ml   Filed Weights   04/11/22 2242 04/12/22 0917  Weight: 72.6 kg 59 kg    Physical Examination: Body mass index is 23.78 kg/m.   General:  Average built, not in obvious distress HENT:   No scleral pallor or icterus noted. Oral mucosa is moist.  Chest: Coarse breath sounds noted, diminished breath sounds bilaterally.   CVS: S1 &S2 heard. No murmur.  Regular rate and rhythm. Abdomen: Soft, nontender, nondistended.  Bowel sounds are heard.   Extremities: No cyanosis, clubbing or edema.  Peripheral pulses are palpable. Psych: Alert, awake and oriented, normal mood CNS:  No cranial nerve deficits.  Power equal in all extremities.   Skin: Warm and dry.  No rashes noted.  Data Reviewed:   CBC: Recent Labs  Lab 04/11/22 2243 04/12/22 0646 04/13/22 0436  WBC 9.0 6.6 7.6  NEUTROABS 6.1  --   --   HGB 12.6 12.1 11.7*  HCT 39.2 37.5 35.8*  MCV 88.1 88.0 87.7  PLT 242 220 412    Basic Metabolic Panel: Recent Labs  Lab 04/11/22 2243 04/12/22 0646 04/13/22 0436  NA 134*  --  138  K 4.1  --  3.5  CL 104  --  103  CO2 24  --  28  GLUCOSE 93  --  103*  BUN 28*  --  31*  CREATININE 0.63 0.61 0.77  CALCIUM 8.9  --  9.0  MG  --   --  1.9    Liver Function Tests: Recent Labs  Lab 04/11/22 2243 04/13/22 0436  AST 95* 66*   ALT 80* 54*  ALKPHOS 76 62  BILITOT 0.8 0.8  PROT 6.4* 6.0*  ALBUMIN 3.8 3.6     Radiology Studies: DG Chest 2 View  Result Date: 04/11/2022 CLINICAL DATA:  Shortness of breath EXAM: CHEST - 2 VIEW COMPARISON:  04/01/2022 FINDINGS: Increased cardiomegaly with vascular congestion and diffuse interstitial and mild ground-glass opacities suspicious for pulmonary edema. Potential tiny pleural effusions. Aortic atherosclerosis. No pneumothorax. Treated compression deformity of the thoracic spine. Breast prostheses IMPRESSION: Cardiomegaly with vascular congestion and diffuse bilateral interstitial and ground-glass opacity suspicious for pulmonary edema. Electronically Signed   By: Donavan Foil M.D.   On: 04/11/2022 23:46      LOS: 1  day    Flora Lipps, MD [facility] Available via Epic secure chat 7am-7pm After these hours, please refer to coverage provider listed on [email or web address] 04/13/2022, 10:11 AM

## 2022-04-13 NOTE — Evaluation (Signed)
Occupational Therapy Evaluation Patient Details Name: Lindsey [name] MRN: [id number] DOB: 11-Aug-1936 Today's Date: 04/13/2022   History of Present Illness Lindsey [name] is a 86 y.o. female with past medical history of bronchiectasis, COPD, chronic venous stasis, DVT status post IVC filter placement, CAD, CHF ejection fraction of 40 to 45% , recently seen by her cardiologist Dr. Rockey Situ on 04/06/2022, presented to hospital with shortness of breath and low pulse ox at home.  Patient stated dyspnea on exertion and leg swelling. In the ED patient had elevated blood pressure and pulse ox was 88% on room air requiring 2 L of oxygen to maintain oxygen in the 90s.   Clinical Impression   Lindsey [name] was seen for OT evaluation this date. Prior to hospital admission, pt was MOD I for mobility using PRN 4WW and SPC. Pt lives alone at [facility]. Pt presents to acute OT demonstrating impaired ADL performance and functional mobility 2/2 decreased activity tolerance and functional strength/ROM/balance deficits. Pt currently requires CGA + HHA for BSC t/f, SBA pericare in standing with single UE support. MOD I don/doff B sock in sitting - increased time. Pt becomes easily out of breath in standing, limiting standing tolerance. SpO2 high 90s t/o on RA. Pt would benefit from skilled OT to address noted impairments and functional limitations (see below for any additional details). Upon hospital discharge, recommend STR to maximize pt safety and return to PLOF.    Recommendations for follow up therapy are one component of a multi-disciplinary discharge planning process, led by the attending physician.  Recommendations may be updated based on patient status, additional functional criteria and insurance authorization.   Follow Up Recommendations  Skilled nursing-short term rehab (<3 hours/day)    Assistance Recommended at Discharge Intermittent Supervision/Assistance  Patient can return home with the  following A lot of help with walking and/or transfers;A lot of help with bathing/dressing/bathroom;Help with stairs or ramp for entrance    Functional Status Assessment  Patient has had a recent decline in their functional status and demonstrates the ability to make significant improvements in function in a reasonable and predictable amount of time.  Equipment Recommendations  BSC/3in1    Recommendations for Other Services       Precautions / Restrictions Precautions Precautions: Fall Restrictions Weight Bearing Restrictions: No      Mobility Bed Mobility Overal bed mobility: Needs Assistance Bed Mobility: Supine to Sit     Supine to sit: Supervision          Transfers Overall transfer level: Needs assistance Equipment used: 1 person hand held assist Transfers: Sit to/from Stand Sit to Stand: Min assist           General transfer comment: braces legs against bed to stand, MIN A for balance      Balance Overall balance assessment: Needs assistance Sitting-balance support: No upper extremity supported, Feet supported Sitting balance-Leahy Scale: Good     Standing balance support: Single extremity supported, During functional activity Standing balance-Leahy Scale: Fair                             ADL either performed or assessed with clinical judgement   ADL Overall ADL's : Needs assistance/impaired                                       General  ADL Comments: CGA + HHA for BSC t/f, SBA pericare in standing with single UE support. MOD I don/doff B sock in sitting - increased time. Short of breath limiting standing tolerance      Pertinent Vitals/Pain Pain Assessment Pain Assessment: No/denies pain     Hand Dominance     Extremity/Trunk Assessment Upper Extremity Assessment Upper Extremity Assessment: Overall WFL for tasks assessed   Lower Extremity Assessment Lower Extremity Assessment: Generalized weakness        Communication Communication Communication: HOH   Cognition Arousal/Alertness: Awake/alert Behavior During Therapy: WFL for tasks assessed/performed Overall Cognitive Status: Within Functional Limits for tasks assessed                                       General Comments  SpO2 high 90s on RA t/o     Home Living Family/patient expects to be discharged to:: Private residence Living Arrangements: Alone Available Help at Discharge: Neighbor Type of Home: Independent living facility Home Access: Level entry     Home Layout: One level     Bathroom Shower/Tub: Chief Strategy Officer: Rollator (4 wheels);Cane - single point;Shower seat          Prior Functioning/Environment Prior Level of Function : Independent/Modified Independent;Driving             Mobility Comments: uses 4WW or SPC as needed, drives to dining at ILF bc it is uphill. ADLs Comments: does not cook        OT Problem List: Decreased strength;Decreased range of motion;Decreased activity tolerance;Impaired balance (sitting and/or standing);Decreased safety awareness      OT Treatment/Interventions: Self-care/ADL training;Therapeutic exercise;DME and/or AE instruction;Energy conservation;Therapeutic activities;Patient/family education;Balance training    OT Goals(Current goals can be found in the care plan section) Acute Rehab OT Goals Patient Stated Goal: to go home OT Goal Formulation: With patient/family Time For Goal Achievement: 04/27/22 Potential to Achieve Goals: Good ADL Goals Pt Will Perform Grooming: with modified independence;standing Pt Will Perform Lower Body Dressing: with modified independence;sit to/from stand Pt Will Transfer to Toilet: with modified independence;ambulating;regular height toilet  OT Frequency: Min 2X/week    Co-evaluation              AM-PAC OT "6 Clicks" Daily Activity     Outcome Measure Help from another person eating  meals?: None Help from another person taking care of personal grooming?: A Little Help from another person toileting, which includes using toliet, bedpan, or urinal?: A Little Help from another person bathing (including washing, rinsing, drying)?: A Lot Help from another person to put on and taking off regular upper body clothing?: A Little Help from another person to put on and taking off regular lower body clothing?: A Little 6 Click Score: 18   End of Session Equipment Utilized During Treatment: Oxygen  Activity Tolerance: Patient tolerated treatment well Patient left: in chair;with call bell/phone within reach;with family/visitor present;with chair alarm set  OT Visit Diagnosis: Unsteadiness on feet (R26.81)                Time: 0370-4888 OT Time Calculation (min): 27 min Charges:  OT General Charges $OT Visit: 1 Visit OT Evaluation $OT Eval Moderate Complexity: 1 Mod OT Treatments $Self Care/Home Management : 8-22 mins  Dessie Coma, M.S. OTR/L  04/13/22, 2:32 PM  ascom [phone]

## 2022-04-13 NOTE — Plan of Care (Signed)
  Problem: Education: Goal: Ability to demonstrate management of disease process will improve Outcome: Progressing Goal: Ability to verbalize understanding of medication therapies will improve Outcome: Progressing Goal: Individualized Educational Video(s) Outcome: Progressing   Problem: Activity: Goal: Capacity to carry out activities will improve Outcome: Progressing   Problem: Cardiac: Goal: Ability to achieve and maintain adequate cardiopulmonary perfusion will improve Outcome: Progressing   Problem: Education: Goal: Knowledge of disease or condition will improve Outcome: Progressing Goal: Knowledge of the prescribed therapeutic regimen will improve Outcome: Progressing Goal: Individualized Educational Video(s) Outcome: Progressing   Problem: Activity: Goal: Ability to tolerate increased activity will improve Outcome: Progressing Goal: Will verbalize the importance of balancing activity with adequate rest periods Outcome: Progressing   Problem: Respiratory: Goal: Ability to maintain a clear airway will improve Outcome: Progressing Goal: Levels of oxygenation will improve Outcome: Progressing Goal: Ability to maintain adequate ventilation will improve Outcome: Progressing   Problem: Education: Goal: Knowledge of General Education information will improve Description Including pain rating scale, medication(s)/side effects and non-pharmacologic comfort measures Outcome: Progressing   Problem: Health Behavior/Discharge Planning: Goal: Ability to manage health-related needs will improve Outcome: Progressing   Problem: Clinical Measurements: Goal: Ability to maintain clinical measurements within normal limits will improve Outcome: Progressing Goal: Will remain free from infection Outcome: Progressing Goal: Diagnostic test results will improve Outcome: Progressing Goal: Respiratory complications will improve Outcome: Progressing Goal: Cardiovascular complication  will be avoided Outcome: Progressing   Problem: Activity: Goal: Risk for activity intolerance will decrease Outcome: Progressing   Problem: Nutrition: Goal: Adequate nutrition will be maintained Outcome: Progressing   Problem: Coping: Goal: Level of anxiety will decrease Outcome: Progressing   Problem: Elimination: Goal: Will not experience complications related to bowel motility Outcome: Progressing Goal: Will not experience complications related to urinary retention Outcome: Progressing   Problem: Pain Managment: Goal: General experience of comfort will improve Outcome: Progressing   Problem: Safety: Goal: Ability to remain free from injury will improve Outcome: Progressing   Problem: Skin Integrity: Goal: Risk for impaired skin integrity will decrease Outcome: Progressing   

## 2022-04-14 ENCOUNTER — Ambulatory Visit: Payer: Medicare Other | Admitting: Family

## 2022-04-14 DIAGNOSIS — F419 Anxiety disorder, unspecified: Secondary | ICD-10-CM | POA: Diagnosis not present

## 2022-04-14 DIAGNOSIS — J9601 Acute respiratory failure with hypoxia: Secondary | ICD-10-CM | POA: Diagnosis not present

## 2022-04-14 DIAGNOSIS — I5043 Acute on chronic combined systolic (congestive) and diastolic (congestive) heart failure: Secondary | ICD-10-CM | POA: Diagnosis not present

## 2022-04-14 DIAGNOSIS — J479 Bronchiectasis, uncomplicated: Secondary | ICD-10-CM | POA: Diagnosis not present

## 2022-04-14 LAB — CBC
HCT: 39.3 % (ref 36.0–46.0)
Hemoglobin: 12.7 g/dL (ref 12.0–15.0)
MCH: 28.5 pg (ref 26.0–34.0)
MCHC: 32.3 g/dL (ref 30.0–36.0)
MCV: 88.1 fL (ref 80.0–100.0)
Platelets: 241 10*3/uL (ref 150–400)
RBC: 4.46 MIL/uL (ref 3.87–5.11)
RDW: 14.4 % (ref 11.5–15.5)
WBC: 7.9 10*3/uL (ref 4.0–10.5)
nRBC: 0 % (ref 0.0–0.2)

## 2022-04-14 LAB — BASIC METABOLIC PANEL
Anion gap: 7 (ref 5–15)
BUN: 24 mg/dL — ABNORMAL HIGH (ref 8–23)
CO2: 28 mmol/L (ref 22–32)
Calcium: 8.8 mg/dL — ABNORMAL LOW (ref 8.9–10.3)
Chloride: 103 mmol/L (ref 98–111)
Creatinine, Ser: 0.58 mg/dL (ref 0.44–1.00)
GFR, Estimated: >60 mL/min (ref >60)
Glucose, Bld: 87 mg/dL (ref 70–99)
Potassium: 3.5 mmol/L (ref 3.5–5.1)
Sodium: 138 mmol/L (ref 135–145)

## 2022-04-14 LAB — MAGNESIUM: Magnesium: 2.4 mg/dL (ref 1.7–2.4)

## 2022-04-14 MED ORDER — IPRATROPIUM-ALBUTEROL 0.5-2.5 (3) MG/3ML IN SOLN: 3.0000 mL | Freq: Three times a day (TID) | RESPIRATORY_TRACT | Status: DC

## 2022-04-14 NOTE — TOC Transition Note (Signed)
Transition of Care [facility]) - CM/SW Discharge Note   Patient Details  Name: Lindsey [name] MRN: [id number] Date of Birth: 05/20/1936  Transition of [facility]) CM/SW Contact:  Beverly Sessions, RN Phone Number: 04/14/2022, 10:47 AM   Clinical Narrative:    Patient will DC to: [facility] Anticipated DC date:04/14/22  Family notified: DIL polly Transport [id number] in Fre[employer]  Per MD patient ready for DC to . RN, patient, patient's family, and facility notified of DC. Discharge Summary sent to facility. RN given number for report. TOC signing off.  Isaias Cowman [facility] [phone]      Barriers to Discharge: Continued Medical Work up   Patient Goals and CMS Choice Patient states their goals for this hospitalization and ongoing recovery are:: Patient called [facility] and wants to go to SNF at [facility] Medicare.gov Compare Post Acute Care list provided to:: Patient Choice offered to / list presented to : Patient  Discharge Placement                       Discharge Plan and Services     Post Acute Care Choice: [place]          DME Arranged: N/A DME Agency: NA       HH Arranged: NA HH Agency: NA        Social Determinants of Health (SDOH) Interventions     Readmission Risk Interventions     No data to display

## 2022-04-14 NOTE — Consult Note (Signed)
Heart Failure Nurse Navigator Note  HFrEF 40-45%.  Wall motion abnormalities, with moderate hypokinesis of the mid basal inferior lateral wall..  Mild left ventricular hypertrophy.  Grade 3 diastolic dysfunction.  Right ventricular systolic function is low normal.  Mild mitral regurgitation.  Mild aortic insufficiency.  She presented to the emergency room with complaints of worsening shortness of breath.  Comorbidities:  Anemia Atrial tachycardia COPD Depression Hypertension Osteoarthritis  Medications:  Aspirin 81 mg daily Atorvastatin 40 mg daily Bisoprolol 5 mg daily Zetia 10 mg daily Furosemide 40 mg IV daily Losartan 25 mg daily Potassium chloride 10 mEq daily  Labs:  Sodium 138, potassium 3.5, chloride 103, CO2 28, BUN 24, creatinine 0.58, GFR greater than 60. Weight is 57.6 kg Blood pressure 151/72 Intake 480 mL Output 900 mL  Met with patient today.  She states that she is being discharged back to her assisted living facility.  Discussed how she takes care of herself daily.  She states that she does weigh herself every morning after going to the bathroom and had noted weight that would fluctuate half pounds up or have pound down but never anything that came close to 3 pounds overnight or 5 pounds within a week.  She states that her doctor questions if her scale is accurate.  She does not add salt at the table although she states some of the meals can taste salty.  He also does not drink over 64 ounces in a days time.  She was to be seen in the outpatient heart failure clinic this Friday but she asked that I move that appointment to after she gets back from her granddaughter's wedding in [place].  She was given an appointment for July 25 at 10:30 in the morning.  She had no further questions.   Pricilla Riffle RN [facility]

## 2022-04-14 NOTE — TOC Progression Note (Signed)
Transition of Care [facility]) - Progression Note    Patient Details  Name: Lindsey [name] MRN: [id number] Date of Birth: [birth year]  Transition of Care [facility]) CM/SW Contact  Beverly Sessions, RN Phone Number: 04/14/2022, 10:03 AM  Clinical Narrative:     PT recommending SNF Patient in agreement  Fl2 sent for signature.  Auth obtained. Reference number [id number].  Valid through 7/13  Expected Discharge Plan: [place] Barriers to Discharge: Continued Medical Work up  Expected Discharge Plan and Services Expected Discharge Plan: [place] Choice: [place] arrangements for the past 2 months: [place]                 DME Arranged: N/A DME Agency: NA       HH Arranged: NA HH Agency: NA         Social Determinants of Health (SDOH) Interventions    Readmission Risk Interventions     No data to display

## 2022-04-14 NOTE — Progress Notes (Signed)
Physical Therapy Treatment Patient Details Name: Lindsey [name] MRN: [id number] DOB: July 05, 1936 Today's Date: 04/14/2022   History of Present Illness Lindsey [name] is a 86 y.o. female with past medical history of bronchiectasis, COPD, chronic venous stasis, DVT status post IVC filter placement, CAD, CHF ejection fraction of 40 to 45% , recently seen by her cardiologist Dr. Rockey Situ on 04/06/2022, presented to hospital with shortness of breath and low pulse ox at home.  Patient stated dyspnea on exertion and leg swelling. In the ED patient had elevated blood pressure and pulse ox was 88% on room air requiring 2 L of oxygen to maintain oxygen in the 90s.    PT Comments    Pt seated at EOB awaiting d/c from hospital.  Pt actively attempting to change clothing and requires assistance from therapist in order to manage oxygen line while performing self-care.  Pt able tp perform with good sitting balance and is able to tolerate standing without any significant drop in SpO2 or difficulty with SOB.  Pt has noted that she was experiencing increased SOB this morning, and admits it likely to be from becoming anxious.  Pt left in room sitting EOB with DIL in room and nursing preparing for d/c.  Will continue to monitor throughout hospital stay for safe d/c to SNF.      Recommendations for follow up therapy are one component of a multi-disciplinary discharge planning process, led by the attending physician.  Recommendations may be updated based on patient status, additional functional criteria and insurance authorization.  Follow Up Recommendations  Skilled nursing-short term rehab (<3 hours/day) Can patient physically be transported by private vehicle: Yes   Assistance Recommended at Discharge Frequent or constant Supervision/Assistance  Patient can return home with the following A lot of help with walking and/or transfers;A lot of help with bathing/dressing/bathroom   Equipment Recommendations   (to be  determined at next venue of care.)    Recommendations for Other Services       Precautions / Restrictions Precautions Precautions: Fall Restrictions Weight Bearing Restrictions: No     Mobility  Bed Mobility               General bed mobility comments: pt sitting upright at EOB awaiting d/c    Transfers Overall transfer level: Needs assistance   Transfers: Sit to/from Stand Sit to Stand: Supervision           General transfer comment: braces legs against bed to stand, MIN A for balance    Ambulation/Gait         Gait velocity: decreased     General Gait Details: deferred due to being d/c.   Stairs             Wheelchair Mobility    Modified Rankin (Stroke Patients Only)       Balance                                            Cognition Arousal/Alertness: Awake/alert Behavior During Therapy: WFL for tasks assessed/performed Overall Cognitive Status: Within Functional Limits for tasks assessed                                          Exercises      General Comments  Pertinent Vitals/Pain Pain Assessment Pain Assessment: No/denies pain    Home Living                          Prior Function            PT Goals (current goals can now be found in the care plan section) Acute Rehab PT Goals Patient Stated Goal: to get stronger. PT Goal Formulation: With patient/family Time For Goal Achievement: 04/27/22 Potential to Achieve Goals: Good Progress towards PT goals: Progressing toward goals    Frequency    Min 2X/week      PT Plan Current plan remains appropriate    Co-evaluation              AM-PAC PT "6 Clicks" Mobility   Outcome Measure  Help needed turning from your back to your side while in a flat bed without using bedrails?: A Little Help needed moving from lying on your back to sitting on the side of a flat bed without using bedrails?: A  Little Help needed moving to and from a bed to a chair (including a wheelchair)?: A Little Help needed standing up from a chair using your arms (e.g., wheelchair or bedside chair)?: A Little Help needed to walk in hospital room?: A Little Help needed climbing 3-5 steps with a railing? : A Lot 6 Click Score: 17    End of Session   Activity Tolerance: Patient tolerated treatment well Patient left: with call bell/phone within reach;in bed;with nursing/sitter in room;with family/visitor present Nurse Communication: Mobility status PT Visit Diagnosis: Unsteadiness on feet (R26.81);Other abnormalities of gait and mobility (R26.89);Repeated falls (R29.6);Muscle weakness (generalized) (M62.81);History of falling (Z91.81);Difficulty in walking, not elsewhere classified (R26.2)     Time: 5009-3818 PT Time Calculation (min) (ACUTE ONLY): 9 min  Charges:  $Therapeutic Activity: 8-22 mins                     Gwenlyn Saran, PT, DPT 04/14/22, 1:31 PM    Christie Nottingham 04/14/2022, 1:28 PM

## 2022-04-14 NOTE — Progress Notes (Addendum)
Occupational Therapy Treatment Patient Details Name: Lindsey [name] MRN: [id number] DOB: 11-03-1935 Today's Date: 04/14/2022   History of present illness Lindsey [name] is a 86 y.o. female with past medical history of bronchiectasis, COPD, chronic venous stasis, DVT status post IVC filter placement, CAD, CHF ejection fraction of 40 to 45% , recently seen by her cardiologist Dr. Rockey Situ on 04/06/2022, presented to hospital with shortness of breath and low pulse ox at home.  Patient stated dyspnea on exertion and leg swelling. In the ED patient had elevated blood pressure and pulse ox was 88% on room air requiring 2 L of oxygen to maintain oxygen in the 90s.   OT comments  Pt seen for OT treatment on this date. Upon arrival to room pt awake and alert, sitting upright in bed with family and nursing present. Pt agreeable to tx focused on grooming tasks at the sink and ADL t/f. Pt required SUPERVISION for bed mobility and STS. MOD I for donning socks bed level - increased time. CGA for grooming tasks at the sink ~8 minutes with single UE support on counter - pt reported SOB upon standing, oxygen at 98% on RA. CGA + RW with min vcs for safe hand placement and RW management for ADL t/f. Pt oxygen remained >90% throughout session on RA. Pt educated on incentive spirometer. Pt verbalized understanding of instruction provided. Pt left in recliner chair with all needs met and family present. Pt making good progress toward goals. Pt continues to benefit from skilled OT services to maximize return to PLOF and minimize risk of future falls, injury. Will continue to follow POC. Discharge recommendation remains appropriate as pt lives alone, does not have assistance, and limited standing tolerance.     Recommendations for follow up therapy are one component of a multi-disciplinary discharge planning process, led by the attending physician.  Recommendations may be updated based on patient status, additional functional  criteria and insurance authorization.    Follow Up Recommendations  Skilled nursing-short term rehab (<3 hours/day)    Assistance Recommended at Discharge Intermittent Supervision/Assistance  Patient can return home with the following  A lot of help with walking and/or transfers;A lot of help with bathing/dressing/bathroom;Help with stairs or ramp for entrance   Equipment Recommendations  BSC/3in1    Recommendations for Other Services      Precautions / Restrictions Precautions Precautions: Fall Restrictions Weight Bearing Restrictions: No       Mobility Bed Mobility Overal bed mobility: Needs Assistance Bed Mobility: Supine to Sit     Supine to sit: Supervision, HOB elevated          Transfers Overall transfer level: Needs assistance Equipment used: Rolling walker (2 wheels) Transfers: Sit to/from Stand Sit to Stand: Supervision                 Balance Overall balance assessment: Needs assistance Sitting-balance support: No upper extremity supported, Feet supported Sitting balance-Leahy Scale: Good     Standing balance support: Single extremity supported, During functional activity Standing balance-Leahy Scale: Fair                             ADL either performed or assessed with clinical judgement   ADL Overall ADL's : Needs assistance/impaired  General ADL Comments: MOD I for donning socks bed level - increased time. CGA for grooming tasks at the sink ~8 minutes. CGA + RW with min vcs for safe hand placement and RW management for ADL t/f.      Cognition Arousal/Alertness: Awake/alert Behavior During Therapy: WFL for tasks assessed/performed Overall Cognitive Status: Within Functional Limits for tasks assessed                                                     Pertinent Vitals/ Pain       Pain Assessment Pain Assessment: Faces Faces Pain Scale: No  hurt   Frequency  Min 2X/week        Progress Toward Goals  OT Goals(current goals can now be found in the care plan section)  Progress towards OT goals: Progressing toward goals  Acute Rehab OT Goals Patient Stated Goal: to go home OT Goal Formulation: With patient/family Time For Goal Achievement: 04/27/22 Potential to Achieve Goals: Good ADL Goals Pt Will Perform Grooming: with modified independence;standing Pt Will Perform Lower Body Dressing: with modified independence;sit to/from stand Pt Will Transfer to Toilet: with modified independence;ambulating;regular height toilet  Plan Discharge plan remains appropriate;Frequency remains appropriate       AM-PAC OT "6 Clicks" Daily Activity     Outcome Measure   Help from another person eating meals?: None Help from another person taking care of personal grooming?: A Little Help from another person toileting, which includes using toliet, bedpan, or urinal?: A Little Help from another person bathing (including washing, rinsing, drying)?: A Lot Help from another person to put on and taking off regular upper body clothing?: A Little Help from another person to put on and taking off regular lower body clothing?: A Little 6 Click Score: 18    End of Session Equipment Utilized During Treatment: Oxygen;Rolling walker (2 wheels)  OT Visit Diagnosis: Unsteadiness on feet (R26.81)   Activity Tolerance Patient tolerated treatment well   Patient Left in chair;with chair alarm set;with family/visitor present   Nurse Communication          Time: 4656-8127 OT Time Calculation (min): 23 min  Charges: OT General Charges $OT Visit: 1 Visit OT Treatments $Self Care/Home Management : 23-37 mins  [employer], OTDS  [employer] 04/14/2022, 9:35 AM

## 2022-04-14 NOTE — Progress Notes (Signed)
Mobility Specialist - Progress Note   04/14/22 1100  Mobility  Activity Ambulated with assistance in room  Level of Assistance Standby assist, set-up cues, supervision of patient - no hands on  Assistive Device Cane  Distance Ambulated (ft) 20 ft  Activity Response Tolerated well  $Mobility charge 1 Mobility     Pre-mobility: 62 HR, 96% SpO2 During mobility: 67 HR, 99% SpO2 Post-mobility: 64 HR, 99% SpO2   Pt sitting EOB on arrival, utilizing 2L. Pt reports feeling fatigued, but agreeable to activity however. Pt stood at bedside x2 with complaints of SOB and 7/10 lightheadedness both attempts, requesting to return to seated position. BP 129/69. Pt performed 3rd STS with lightheadedness somewhat improved, progressing to ambulation with SPC in room. Still reports feeling SOB despite O2 maintaining high 90s. Lightheadedness again worsening with increased distance. Pt returned to EOB with needs in reach, family at bedside.    Kathee Delton Mobility Specialist 04/14/22, 11:27 AM

## 2022-04-14 NOTE — NC FL2 (Signed)
[place] LEVEL OF CARE SCREENING TOOL     IDENTIFICATION  Patient Name: [place]: February 01, 1936 Sex: female Admission Date (Current Location): 04/11/2022  [facility] [place] Number:  Engineering geologist and Address:         Provider Number: [id number]  Attending Physician Name and Address:  Flora Lipps, MD  Relative Name and Phone Number:       Current Level of Care: Hospital Recommended Level of Care: [place] Prior Approval Number:    Date Approved/Denied:   PASRR Number: [id number]  Discharge Plan: SNF    Current Diagnoses: Patient Active Problem List   Diagnosis Date Noted   CHF (congestive heart failure) ([place]) 04/12/2022   PAD (peripheral artery disease) ([place])    Acute respiratory failure with hypoxia ([place])    CAD S/P percutaneous coronary angioplasty    Acute on chronic combined systolic and diastolic CHF (congestive heart failure) ([place]) 03/03/2022   Non-ST elevation (NSTEMI) myocardial infarction ([place]) 03/01/2022   Elevated liver function tests 03/01/2022   S/P TKR (total knee replacement) using cement, bilateral 09/19/2020   TIA (transient ischemic attack) 01/03/2020   Migraine headache with aura 01/03/2020   Uncontrolled hypertension 01/03/2020   Anxiety and depression 01/03/2020   COPD with chronic bronchitis ([place]) 01/03/2020   Hypokalemia 01/03/2020   SOB (shortness of breath) 04/30/2014   Leg swelling 04/30/2014   Bronchiectasis ([place]) 04/30/2014   History of DVT (deep vein thrombosis) 04/30/2014   Labile blood pressure 04/30/2014   S/P IVC filter 04/30/2014   History of right knee surgery 04/30/2014    Orientation RESPIRATION BLADDER Height & Weight     Self, Time, Situation, Place  Normal Incontinent, External catheter Weight: 57.6 kg Height:  '5\' 2"'$  (157.5 cm)  BEHAVIORAL SYMPTOMS/MOOD NEUROLOGICAL BOWEL NUTRITION STATUS      Continent Diet (Heart Healthy)  AMBULATORY STATUS COMMUNICATION  OF NEEDS Skin   Limited Assist Verbally Normal                       Personal Care Assistance Level of Assistance              Functional Limitations Info  Hearing   Hearing Info: Impaired      SPECIAL CARE FACTORS FREQUENCY  PT (By licensed PT), OT (By licensed OT)                    Contractures Contractures Info: Not present    Additional Factors Info  Code Status, Allergies Code Status Info: Full Allergies Info: Ivp Dye (Iodinated Contrast Media), Charentais Melon (French Melon), Codeine, Cucumber Extract, Demeclocycline, Galcanezumab-gnlm, Sulfa Antibiotics, Tegaderm Ag Mesh (Silver), Telmisartan, Tetracyclines & Related, Wild Lettuce Extract (Lactuca Virosa)           Current Medications (04/14/2022):  This is the current hospital active medication list Current Facility-Administered Medications  Medication Dose Route Frequency Provider Last Rate Last Admin   acetaminophen (TYLENOL) tablet 650 mg  650 mg Oral Q6H PRN Athena Masse, MD       Or   acetaminophen (TYLENOL) suppository 650 mg  650 mg Rectal Q6H PRN Athena Masse, MD       albuterol (PROVENTIL) (2.5 MG/3ML) 0.083% nebulizer solution 2.5 mg  2.5 mg Nebulization Q2H PRN Athena Masse, MD       ARIPiprazole (ABILIFY) tablet 2 mg  2 mg Oral Daily Athena Masse, MD   2 mg at  04/14/22 0838   aspirin EC tablet 81 mg  81 mg Oral Daily Athena Masse, MD   81 mg at 04/14/22 0838   atorvastatin (LIPITOR) tablet 40 mg  40 mg Oral Daily Athena Masse, MD   40 mg at 04/14/22 0838   bisoprolol (ZEBETA) tablet 5 mg  5 mg Oral Daily Judd Gaudier V, MD   5 mg at 04/14/22 0839   enoxaparin (LOVENOX) injection 40 mg  40 mg Subcutaneous Q24H Judd Gaudier V, MD   40 mg at 04/14/22 0837   ezetimibe (ZETIA) tablet 10 mg  10 mg Oral Daily Athena Masse, MD   10 mg at 04/14/22 0838   furosemide (LASIX) injection 40 mg  40 mg Intravenous Daily Lavina Hamman, MD   40 mg at 04/14/22 0839   losartan  (COZAAR) tablet 25 mg  25 mg Oral Daily Judd Gaudier V, MD   25 mg at 04/14/22 0838   ondansetron (ZOFRAN) tablet 4 mg  4 mg Oral Q6H PRN Athena Masse, MD       Or   ondansetron [facility]) injection 4 mg  4 mg Intravenous Q6H PRN Athena Masse, MD       potassium chloride (KLOR-CON M) CR tablet 10 mEq  10 mEq Oral Daily Judd Gaudier V, MD   10 mEq at 04/14/22 0838   sodium chloride HYPERTONIC 3 % nebulizer solution 5 mL  5 mL Nebulization Q4H PRN Athena Masse, MD       venlafaxine XR (EFFEXOR-XR) 24 hr capsule 150 mg  150 mg Oral Daily Athena Masse, MD   150 mg at 04/14/22 4332     Discharge Medications: Please see discharge summary for a list of discharge medications.  Relevant Imaging Results:  Relevant Lab Results:   Additional Information SS# [id number]  Beverly Sessions, RN

## 2022-04-14 NOTE — Discharge Summary (Signed)
Physician Discharge Summary   Patient: Lindsey [name] MRN: [id number] DOB: 09/13/1936  Admit date:     04/11/2022  Discharge date: 04/14/22  Discharge Physician: Flora Lipps   PCP: Tracie Harrier, MD   Recommendations at discharge:   Follow-up with primary care provider at the skilled nursing facility in 3 to 5 days.   Check CBC BMP magnesium in the next visit. Follow-up with cardiology Dr. Rockey Situ as scheduled by the clinic Follow-up up with pulmonary physician at the scheduled by the patient/clinic. Patient will benefit from CHF protocol including low-salt diet fluid restriction on a continual basis.  Discharge Diagnoses: Principal Problem:   Acute respiratory failure with hypoxia (HCC) Active Problems:   Acute on chronic combined systolic and diastolic CHF (congestive heart failure) (HCC)   CAD S/P percutaneous coronary angioplasty   Bronchiectasis (HCC)   S/P IVC filter   Anxiety and depression   COPD with chronic bronchitis (HCC)   PAD (peripheral artery disease) ([place])  Resolved Problems:   * No resolved hospital problems. *  Hospital Course: Lindsey [name] is a 86 y.o. female with past medical history of bronchiectasis, COPD, chronic venous stasis, DVT status post IVC filter placement, CAD, CHF ejection fraction of 40 to 45% , recently seen by her cardiologist Dr. Rockey Situ on 04/06/2022, presented to hospital with shortness of breath and low pulse ox at home.  Patient stated dyspnea on exertion and leg swelling.  Complained of cough with pinkish phlegm.    In the ED, patient had elevated blood pressure and pulse ox was 88% on room air requiring 2 L of oxygen to maintain oxygen in the 90s.  Labs troponin 30 and BNP 985.  CBC and CMP with elevated AST/ALT of 95/80.  Urinalysis with large leukocyte esterase. EKG without ST or T wave changes.  Chest x-ray showed cardiomegaly with vascular congestion and diffuse bilateral interstitial and groundglass opacities suspicious for  pulmonary edema. Patient was then admitted hospital for further evaluation and treatment.  Following conditions were addressed during hospitalization,  Acute respiratory failure with hypoxia ([place]) Thought to be secondary to congestive heart failure.  Currently on room air.  Initially was hypoxic and required 2 L of oxygen.  Continue IV diuresis.    Acute on chronic combined systolic and diastolic CHF (congestive heart failure) (HCC) BNP over 900 and chest x-ray with vascular congestion.  Received IV Lasix 40 daily during hospitalization with good diuresis and symptomatic improvement..  On oral Lasix at home which will be continued on discharge.. Continue losartan and bisoprolol.  Recommend Daily weights, low-salt diet and fluid restriction on discharge. 2D echocardiogram on 03/04/22 showed EF 40 to 45% and grade 3 diastolic dysfunction. patient follows up with Dr. Rockey Situ cardiology as outpatient.  Would recommend cardiology follow-up as outpatient.   CAD S/P percutaneous coronary angioplasty Denies any chest pain.  Follows up with cardiology as outpatient.  Recent heart cath on 03/04/2022 showed severe single-vessel CAD of left circumflex with collaterals being treated medically.  Continue aspirin Lipitor bisoprolol. Patient follows with [facility] Dr. Rockey Situ.  No acute issues during hospitalization.   History of bronchiectasis (HCC) Continue bronchodilators on discharge.  Remained stable.    PAD (peripheral artery disease) (HCC) Continue aspirin and atorvastatin   Anxiety and depression Continue venlafaxine   S/P IVC filter.   No concern for thromboembolic phenomena.   Debility, weakness.  Difficulty taking care of herself at independent living facility.  Currently being considered for skilled nursing facility.  Consultants: None  Procedures performed: None Disposition: Skilled nursing facility.  Communicated with the patient's family at bedside prior to discharge.  Diet recommendation:   Discharge Diet Orders (From admission, onward)     Start     Ordered   04/14/22 0000  Diet - low sodium heart healthy       Comments: Fluid restriction 1500 MLS per day.   04/14/22 1033           Cardiac diet DISCHARGE MEDICATION: Allergies as of 04/14/2022       Reactions   Ivp Dye [iodinated Contrast Media] Anaphylaxis   Charentais Melon (french Melon) Other (See Comments)   All melons cause stomach pain and upset   Codeine Other (See Comments)   Dizziness  Dizziness   Cucumber Extract    Demeclocycline Other (See Comments)   Stomach upset    Galcanezumab-gnlm Itching   Sulfa Antibiotics Hives   Tegaderm Ag Mesh [silver]    Telmisartan    dizziness Other reaction(s): Dizziness dizziness   Tetracyclines & Related    Stomach upset    Wild Lettuce Extract (lactuca Virosa) Other (See Comments)   Stomach upset and pain        Medication List     STOP taking these medications    cephALEXin 500 MG capsule Commonly known as: KEFLEX   NIACIN PO       TAKE these medications    acetaminophen 500 MG tablet Commonly known as: TYLENOL Take 500 mg by mouth at bedtime as needed for moderate pain.   albuterol 108 (90 Base) MCG/ACT inhaler Commonly known as: VENTOLIN HFA Inhale 2 puffs into the lungs every 6 (six) hours as needed for wheezing or shortness of breath.   ARIPiprazole 2 MG tablet Commonly known as: ABILIFY Take 2 mg by mouth daily.   aspirin EC 81 MG tablet Take 1 tablet (81 mg total) by mouth daily. Swallow whole.   atorvastatin 40 MG tablet Commonly known as: LIPITOR Take 1 tablet (40 mg total) by mouth daily.   bisoprolol 5 MG tablet Commonly known as: ZEBETA Take 1 tablet (5 mg total) by mouth daily.   clopidogrel 75 MG tablet Commonly known as: PLAVIX Take 1 tablet (75 mg total) by mouth daily with breakfast.   cyclobenzaprine 5 MG tablet Commonly known as: FLEXERIL Take 5 mg by mouth at bedtime as needed.   ezetimibe 10 MG  tablet Commonly known as: ZETIA Take 1 tablet (10 mg total) by mouth daily.   FISH OIL OMEGA-3 PO Take 2 capsules by mouth at bedtime.   furosemide 40 MG tablet Commonly known as: LASIX Take 1 tablet (40 mg total) by mouth daily.   hydrOXYzine 25 MG tablet Commonly known as: ATARAX Take 25 mg by mouth as needed for anxiety.   losartan 25 MG tablet Commonly known as: COZAAR Take 1 tablet (25 mg total) by mouth daily.   polyethylene glycol 17 g packet Commonly known as: MIRALAX / GLYCOLAX Take 17 g by mouth daily as needed for mild constipation or moderate constipation.   potassium chloride 10 MEQ tablet Commonly known as: KLOR-CON M Take 10 mEq by mouth daily. What changed: Another medication with the same name was removed. Continue taking this medication, and follow the directions you see here.   PRESERVISION AREDS 2 PO Take by mouth.   rizatriptan 5 MG tablet Commonly known as: MAXALT Take 5 mg by mouth as needed for migraine. As needed. Needed 3 times in past 3 months  sodium chloride HYPERTONIC 3 % nebulizer solution Take 5 mLs by nebulization every 4 (four) hours as needed.   Symbicort 160-4.5 MCG/ACT inhaler Generic drug: budesonide-formoterol SMARTSIG:2 Inhalation Via Inhaler Twice Daily   venlafaxine XR 150 MG 24 hr capsule Commonly known as: EFFEXOR-XR Take 150 mg by mouth daily.   vitamin B-6 25 MG tablet Commonly known as: pyridOXINE Take 2.5 mg by mouth daily.       Subjective   Today, patient was seen and examined at bedside.  Feels much better today.  Better with breathing.  Patient's family at bedside.  Denies any pain nausea vomiting shortness of breath or dyspnea  Discharge Exam: Filed Weights   04/11/22 2242 04/12/22 0917 04/14/22 0429  Weight: 72.6 kg 59 kg 57.6 kg   Vitals:   04/14/22 0428 04/14/22 0737  BP: 140/76 (!) 151/72  Pulse: 88 69  Resp: 17 18  Temp: 98.7 F (37.1 C) 98.6 F (37 C)  SpO2: 96% 94%   Body mass index is  23.23 kg/m.   General:  Average built, not in obvious distress HENT:   No scleral pallor or icterus noted. Oral mucosa is moist.  Chest:  .  Diminished breath sounds bilaterally.  Coarse breath sounds noted. CVS: S1 &S2 heard. No murmur.  Regular rate and rhythm. Abdomen: Soft, nontender, nondistended.  Bowel sounds are heard.   Extremities: No cyanosis, clubbing, signs of venous insufficiency peripheral pulses are palpable. Psych: Alert, awake and oriented, normal mood CNS:  No cranial nerve deficits.  Power equal in all extremities.   Skin: Warm and dry.  No rashes noted.   Condition at discharge: good  The results of significant diagnostics from this hospitalization (including imaging, microbiology, ancillary and laboratory) are listed below for reference.   Imaging Studies: DG Chest 2 View  Result Date: 04/11/2022 CLINICAL DATA:  Shortness of breath EXAM: CHEST - 2 VIEW COMPARISON:  04/01/2022 FINDINGS: Increased cardiomegaly with vascular congestion and diffuse interstitial and mild ground-glass opacities suspicious for pulmonary edema. Potential tiny pleural effusions. Aortic atherosclerosis. No pneumothorax. Treated compression deformity of the thoracic spine. Breast prostheses IMPRESSION: Cardiomegaly with vascular congestion and diffuse bilateral interstitial and ground-glass opacity suspicious for pulmonary edema. Electronically Signed   By: Donavan Foil M.D.   On: 04/11/2022 23:46   DG Chest 2 View  Result Date: 04/01/2022 CLINICAL DATA:  Shortness of breath, recent myocardial infarction EXAM: CHEST - 2 VIEW COMPARISON:  03/01/2022 FINDINGS: No focal consolidation. No pleural effusion or pneumothorax. Stable cardiomegaly. No acute osseous abnormality. Midthoracic spine vertebral augmentation. S-shaped scoliosis of the thoracolumbar spine. IMPRESSION: 1. No active cardiopulmonary disease. Electronically Signed   By: Kathreen Devoid M.D.   On: 04/01/2022 16:32     Microbiology: Results for orders placed or performed during the hospital encounter of 04/11/22  SARS Coronavirus 2 by RT PCR (hospital order, performed in [facility] hospital lab) *cepheid single result test* Anterior Nasal Swab     Status: None   Collection Time: 04/11/22 10:43 PM   Specimen: Anterior Nasal Swab  Result Value Ref Range Status   SARS Coronavirus 2 by RT PCR NEGATIVE NEGATIVE Final    Comment: (NOTE) SARS-CoV-2 target nucleic acids are NOT DETECTED.  The SARS-CoV-2 RNA is generally detectable in upper and lower respiratory specimens during the acute phase of infection. The lowest concentration of SARS-CoV-2 viral copies this assay can detect is 250 copies / mL. A negative result does not preclude SARS-CoV-2 infection and should not be used  as the sole basis for treatment or other patient management decisions.  A negative result may occur with improper specimen collection / handling, submission of specimen other than nasopharyngeal swab, presence of viral mutation(s) within the areas targeted by this assay, and inadequate number of viral copies (<250 copies / mL). A negative result must be combined with clinical observations, patient history, and epidemiological information.  Fact Sheet for Patients:   https://www.patel.info/  Fact Sheet for Healthcare Providers: https://hall.com/  This test is not yet approved or  cleared by the [place] FDA and has been authorized for detection and/or diagnosis of SARS-CoV-2 by FDA under an Emergency Use Authorization (EUA).  This EUA will remain in effect (meaning this test can be used) for the duration of the COVID-19 declaration under Section 564(b)(1) of the Act, 21 U.S.C. section 360bbb-3(b)(1), unless the authorization is terminated or revoked sooner.  Performed at [facility], [address], [place], [place] [address]   Urine Culture     Status: Abnormal    Collection Time: 04/11/22 10:43 PM   Specimen: Urine, Clean Catch  Result Value Ref Range Status   Specimen Description   Final    URINE, CLEAN CATCH Performed at [facility], [address], [place], [place] [address]    Special Requests   Final    NONE Performed at [facility], [address] [place], [place] [address]    Culture (A)  Final    <10,000 COLONIES/mL INSIGNIFICANT GROWTH Performed at [facility] [address], [place], [place] [address]    Report Status 04/13/2022 FINAL  Final    Labs: CBC: Recent Labs  Lab 04/11/22 2243 04/12/22 0646 04/13/22 0436 04/14/22 0355  WBC 9.0 6.6 7.6 7.9  NEUTROABS 6.1  --   --   --   HGB 12.6 12.1 11.7* 12.7  HCT 39.2 37.5 35.8* 39.3  MCV 88.1 88.0 87.7 88.1  PLT 242 220 230 144   Basic Metabolic Panel: Recent Labs  Lab 04/11/22 2243 04/12/22 0646 04/13/22 0436 04/14/22 0355  NA 134*  --  138 138  K 4.1  --  3.5 3.5  CL 104  --  103 103  CO2 24  --  28 28  GLUCOSE 93  --  103* 87  BUN 28*  --  31* 24*  CREATININE 0.63 0.61 0.77 0.58  CALCIUM 8.9  --  9.0 8.8*  MG  --   --  1.9 2.4   Liver Function Tests: Recent Labs  Lab 04/11/22 2243 04/13/22 0436  AST 95* 66*  ALT 80* 54*  ALKPHOS 76 62  BILITOT 0.8 0.8  PROT 6.4* 6.0*  ALBUMIN 3.8 3.6   CBG: No results for input(s): "GLUCAP" in the last 168 hours.  Discharge time spent: greater than 30 minutes.  Signed: Flora Lipps, MD [facility] 04/14/2022

## 2022-04-21 ENCOUNTER — Other Ambulatory Visit: Payer: Self-pay | Admitting: Pulmonary Disease

## 2022-04-21 DIAGNOSIS — R0600 Dyspnea, unspecified: Secondary | ICD-10-CM

## 2022-05-01 ENCOUNTER — Ambulatory Visit: Payer: Medicare Other | Admitting: Family

## 2022-05-07 ENCOUNTER — Emergency Department
Admission: EM | Admit: 2022-05-07 | Discharge: 2022-05-07 | Disposition: A | Discharge status: Home / Self Care | Payer: Medicare Other | Attending: Emergency Medicine | Admitting: Emergency Medicine

## 2022-05-07 ENCOUNTER — Encounter: Payer: Self-pay | Admitting: Emergency Medicine

## 2022-05-07 ENCOUNTER — Emergency Department: Payer: Medicare Other

## 2022-05-07 ENCOUNTER — Other Ambulatory Visit: Payer: Self-pay

## 2022-05-07 DIAGNOSIS — R079 Chest pain, unspecified: Secondary | ICD-10-CM

## 2022-05-07 DIAGNOSIS — I2510 Atherosclerotic heart disease of native coronary artery without angina pectoris: Secondary | ICD-10-CM | POA: Insufficient documentation

## 2022-05-07 DIAGNOSIS — R0789 Other chest pain: Secondary | ICD-10-CM | POA: Insufficient documentation

## 2022-05-07 LAB — CBC
HCT: 40 % (ref 36.0–46.0)
Hemoglobin: 12.6 g/dL (ref 12.0–15.0)
MCH: 28.1 pg (ref 26.0–34.0)
MCHC: 31.5 g/dL (ref 30.0–36.0)
MCV: 89.1 fL (ref 80.0–100.0)
Platelets: 214 10*3/uL (ref 150–400)
RBC: 4.49 MIL/uL (ref 3.87–5.11)
RDW: 13.9 % (ref 11.5–15.5)
WBC: 5.3 10*3/uL (ref 4.0–10.5)
nRBC: 0 % (ref 0.0–0.2)

## 2022-05-07 LAB — PROTIME-INR
INR: 0.9 (ref 0.8–1.2)
Prothrombin Time: 12 seconds (ref 11.4–15.2)

## 2022-05-07 LAB — TROPONIN I (HIGH SENSITIVITY)
Troponin I (High Sensitivity): 22 ng/L — ABNORMAL HIGH (ref <18)
Troponin I (High Sensitivity): 24 ng/L — ABNORMAL HIGH (ref <18)

## 2022-05-07 LAB — BASIC METABOLIC PANEL
Anion gap: 8 (ref 5–15)
BUN: 28 mg/dL — ABNORMAL HIGH (ref 8–23)
CO2: 25 mmol/L (ref 22–32)
Calcium: 9.2 mg/dL (ref 8.9–10.3)
Chloride: 102 mmol/L (ref 98–111)
Creatinine, Ser: 0.71 mg/dL (ref 0.44–1.00)
GFR, Estimated: >60 mL/min (ref >60)
Glucose, Bld: 104 mg/dL — ABNORMAL HIGH (ref 70–99)
Potassium: 3.7 mmol/L (ref 3.5–5.1)
Sodium: 135 mmol/L (ref 135–145)

## 2022-05-07 NOTE — ED Triage Notes (Signed)
Pt to ED via EMS from [facility] c/o  mid left chest pain, heaviness at midnight.  Same feeling in May and dx with RBBB and MI at some point.  Denies SOB, nausea or radiation.  Some dizziness.  States took CBD gummy last night for first time.  96% RA, 148/91.  Pt A&Ox4, chest rise even and unlabored, pain free at this time, in NAD at this time.  Takes Plavix.

## 2022-05-07 NOTE — Progress Notes (Deleted)
Cardiology Office Note  Date:  05/07/2022   ID:  Lindsey, [name] 1935/11/06, MRN [id number]  PCP:  Tracie Harrier, MD   No chief complaint on file.   HPI:  [name] is a pleasant 86 year old woman with history of  lower extremity swelling,Secondary to venous insufficiency  prior right knee surgery,  right DVT, status post IVC filter,  bronchiectasis on inhalers and nebulizers at home  Back surgery who presents for follow up of her labile blood pressure and her leg swelling.  Last seen in clinic by myself April 2023  Seen in the Er 04/01/22 for SOB intermittently short of breath with exertion for about the past 2 weeks Acute cystitis with hematuria, treated with ABX  Cardiac cath 03/03/22 Severe single vessel coronary artery disease with occlusion of mid LCx with left-to-left and right-to-left collaterals supplying distal OM branches.  Given patient's history of chest pain ~3 weeks ago following by progressive heart failure symptoms, I suspect her initial coronary event occurred at that time. Mild-moderate, non-obstructive CAD involving the LAD and RCA. Mildly-moderately elevated left heart filling pressures. Moderate pulmonary hypertension. Normal Fick cardiac output/index.   Recommendations: aspirin and clopidogrel for 12 months. No role for PCI to occluded LCx at this time, as event likely occurred ~3 weeks ago.  Echo 03/04/22  1. Left ventricular ejection fraction, by estimation, is 40 to 45%. The  left ventricle has mildly decreased function. The left ventricle  demonstrates regional wall motion abnormalities There is mild left  ventricular hypertrophy. Left ventricular diastolic parameters are  consistent with Grade III diastolic dysfunction (restrictive). There is  moderate hypokinesis of the left ventricular, basal-mid inferolateral wall.   2. Right ventricular systolic function is low normal. The right ventricular size is normal.   3. The mitral valve was not  well visualized. Mild mitral valve regurgitation.   4. The aortic valve was not well visualized. Aortic valve regurgitation is mild.   5. The inferior vena cava is dilated in size with <50% respiratory variability, suggesting right atrial pressure of 15 mmHg.   Lab work reviewed Total cholesterol 165 LDL 74  Started therapy/PT, not walking SOB on exertion Started PT but has not gone back, went several times  BP better at home 140/80  Followed by pulmonary Has received oxygen, has not been using it  EKG personally reviewed by myself on todays visit Normal sinus rhythm rate 62 bpm PACs, right bundle branch block, left anterior fascicular block  Other past medical history reviewed [facility] ED on 10/15/21 for evaluation of dizziness.  weakness and disorientation, her legs felt very heavy, and she was not sure if she could take the next step.  There was no evidence of arrhythmia or ischemia on cardiac monitor with the exception of frequent PVCs. Orthostatic VS were unremarkable. Hs troponins were within normal range  Echocardiogram November 24, 2021 Reviewed on today's visit Normal LV function Normal RV function No significant valve disease  Zio monitor reviewed Patient had a min HR of 40 bpm, max HR of 143 bpm, and avg HR of 55 bpm. Predominant underlying rhythm was Sinus Rhythm. 5 Supraventricular Tachycardia runs occurred, the run with  the fastest interval lasting 10 beats with a max rate of 143 bpm (avg 136 bpm); the run with the fastest interval was also the longest.  Supraventricular Tachycardia / Atrial Tachycardia Isolated SVEs  were frequent (12.9%, 71696), SVE Couplets were rare (<1.0%, 1059), and SVE Triplets were rare (<1.0%, 17).  Isolated VEs were  rare (<1.0%), and no VE Couplets or VE Triplets were present.  Off telmistartan, stopped , "Causes dizziness"  has carotid disease done through [facility], was told she had moderate disease   PMH:   has a past medical history of  Anemia, Atrial tachycardia ([place]), Breast cancer ([place]), Bronchiectasis ([place]) (06/2007), CHF (congestive heart failure) ([place]), Chronic Dizziness, Colitis, COPD (chronic obstructive pulmonary disease) ([place]), Depression, Diastolic dysfunction, DVT (deep venous thrombosis) ([place]), Fibromyalgia, Labile Hypertension, Migraine headache with aura, Osteoarthritis, Osteoporosis, PAD (peripheral artery disease) ([place]), Pneumonia, Polio (1952), and Stenosis of carotid artery.  PSH:    Past Surgical History:  Procedure Laterality Date   ABDOMINAL HYSTERECTOMY     BACK SURGERY     BILATERAL TOTAL MASTECTOMY WITH AXILLARY LYMPH NODE DISSECTION     BREAST SURGERY     CARPAL TUNNEL RELEASE     bilateral    COLONOSCOPY WITH PROPOFOL N/A 08/02/2015   Procedure: COLONOSCOPY WITH PROPOFOL;  Surgeon: Manya Silvas, MD;  Location: [facility];  Service: Endoscopy;  Laterality: N/A;   FINGER TENDON REPAIR     HERNIA REPAIR     REPLACEMENT TOTAL KNEE     right knee    RIGHT/LEFT HEART CATH AND CORONARY ANGIOGRAPHY N/A 03/03/2022   Procedure: RIGHT/LEFT HEART CATH AND CORONARY ANGIOGRAPHY;  Surgeon: Nelva Bush, MD;  Location: [facility];  Service: Cardiovascular;  Laterality: N/A;   SQUAMOUS CELL CARCINOMA EXCISION     TONSILLECTOMY     TOTAL KNEE ARTHROPLASTY Left 09/19/2020   Procedure: TOTAL KNEE ARTHROPLASTY;  Surgeon: Hessie Knows, MD;  Location: [facility] ORS;  Service: Orthopedics;  Laterality: Left;   UMBILICAL HERNIA REPAIR     VAGINAL HYSTERECTOMY      Current Outpatient Medications on File Prior to Visit  Medication Sig Dispense Refill   acetaminophen (TYLENOL) 500 MG tablet Take 500 mg by mouth at bedtime as needed for moderate pain.     albuterol (VENTOLIN HFA) 108 (90 Base) MCG/ACT inhaler Inhale 2 puffs into the lungs every 6 (six) hours as needed for wheezing or shortness of breath.     ARIPiprazole (ABILIFY) 2 MG tablet Take 2 mg by mouth daily.      aspirin EC 81 MG tablet Take  1 tablet (81 mg total) by mouth daily. Swallow whole. 30 tablet 12   atorvastatin (LIPITOR) 40 MG tablet Take 1 tablet (40 mg total) by mouth daily. 90 tablet 1   bisoprolol (ZEBETA) 5 MG tablet Take 1 tablet (5 mg total) by mouth daily. 90 tablet 3   clopidogrel (PLAVIX) 75 MG tablet Take 1 tablet (75 mg total) by mouth daily with breakfast. 90 tablet 1   cyclobenzaprine (FLEXERIL) 5 MG tablet Take 5 mg by mouth at bedtime as needed.     ezetimibe (ZETIA) 10 MG tablet Take 1 tablet (10 mg total) by mouth daily. 90 tablet 0   furosemide (LASIX) 40 MG tablet Take 1 tablet (40 mg total) by mouth daily. 30 tablet 1   hydrOXYzine (ATARAX/VISTARIL) 25 MG tablet Take 25 mg by mouth as needed for anxiety.     losartan (COZAAR) 25 MG tablet Take 1 tablet (25 mg total) by mouth daily. 90 tablet 1   Multiple Vitamins-Minerals (PRESERVISION AREDS 2 PO) Take by mouth.     Omega-3 Fatty Acids (FISH OIL OMEGA-3 PO) Take 2 capsules by mouth at bedtime.     polyethylene glycol (MIRALAX / GLYCOLAX) packet Take 17 g by mouth daily as needed for mild  constipation or moderate constipation.      potassium chloride (KLOR-CON M) 10 MEQ tablet Take 10 mEq by mouth daily.     rizatriptan (MAXALT) 5 MG tablet Take 5 mg by mouth as needed for migraine. As needed. Needed 3 times in past 3 months     sodium chloride HYPERTONIC 3 % nebulizer solution Take 5 mLs by nebulization every 4 (four) hours as needed.     SYMBICORT 160-4.5 MCG/ACT inhaler SMARTSIG:2 Inhalation Via Inhaler Twice Daily     venlafaxine XR (EFFEXOR-XR) 150 MG 24 hr capsule Take 150 mg by mouth daily.     vitamin B-6 (PYRIDOXINE) 25 MG tablet Take 2.5 mg by mouth daily.     No current facility-administered medications on file prior to visit.    Allergies:   Ivp dye [iodinated contrast media], Charentais melon (french melon), Codeine, Cucumber extract, Demeclocycline, Galcanezumab-gnlm, Sulfa antibiotics, Tegaderm ag mesh [silver], Telmisartan,  Tetracyclines & related, and Wild lettuce extract (lactuca virosa)   Social History:  The patient  reports that she has never smoked. She has never used smokeless tobacco. She reports current alcohol use of about 4.0 standard drinks of alcohol per week. She reports that she does not use drugs.   Family History:   family history includes Arthritis in her father and mother; Atrial fibrillation in her sister; Bladder Cancer in her father; Pancreatic cancer in her mother.    Review of Systems: Review of Systems  Constitutional: Negative.   HENT: Negative.    Respiratory:  Positive for shortness of breath.   Cardiovascular: Negative.   Gastrointestinal: Negative.   Musculoskeletal:        Leg weakness  Neurological:  Positive for dizziness.  Psychiatric/Behavioral: Negative.    All other systems reviewed and are negative.   PHYSICAL EXAM: VS:  There were no vitals taken for this visit. , BMI There is no height or weight on file to calculate BMI.  Constitutional:  oriented to person, place, and time. No distress.  HENT:  Head: Grossly normal Eyes:  no discharge. No scleral icterus.  Neck: No JVD, no carotid bruits  Cardiovascular: Regular rate and rhythm, no murmurs appreciated Pulmonary/Chest: Clear to auscultation bilaterally, no wheezes or rails Abdominal: Soft.  no distension.  no tenderness.  Musculoskeletal: Normal range of motion Neurological:  normal muscle tone. Coordination normal. No atrophy Skin: Skin warm and dry Psychiatric: normal affect, pleasant   Recent Labs: 04/11/2022: B Natriuretic Peptide 985.2 04/13/2022: ALT 54 04/14/2022: Magnesium 2.4 05/07/2022: BUN 28; Creatinine, Ser 0.71; Hemoglobin 12.6; Platelets 214; Potassium 3.7; Sodium 135    Lipid Panel Lab Results  Component Value Date   CHOL 186 03/02/2022   HDL 70 03/02/2022   LDLCALC 109 (H) 03/02/2022   TRIG 37 03/02/2022    Wt Readings from Last 3 Encounters:  05/07/22 125 lb (56.7 kg)  04/14/22  126 lb 15.8 oz (57.6 kg)  04/06/22 127 lb 9.6 oz (57.9 kg)     ASSESSMENT AND PLAN:  Coronary artery disease with stable angina Catheterization May 2023, occluded circumflex with collaterals Stay on Lipitor 40, Add Zetia 10 Recommend she restart cardiac rehab  Labile blood pressure Blood pressure is well controlled on today's visit. No changes made to the medications.  History of DVT (deep vein thrombosis) No recurrent episodes, stable  Bronchiectasis without complication (HCC) Having chronic shortness of breath, suggest she try her oxygen Has been treated with previous courses of steroids and antibiotics Followed by pulmonary  Leg swelling  taking Lasix 20 mg, lab work stable No leg edema  PAD/carotid disease Goal LDL less than 70 On statin adding Zetia  Atrial tachycardia Continue bisoprolol 5 mg daily,  Denies tachycardia Prior Zio monitor  no significant symptoms    Total encounter time more than 40 minutes  Greater than 50% was spent in counseling and coordination of care with the patient   No orders of the defined types were placed in this encounter.     Signed, Esmond Plants, M.D., Ph.D. 05/07/2022  [place], [place]

## 2022-05-07 NOTE — ED Notes (Addendum)
Attempted to give report to village at [place] with no success

## 2022-05-07 NOTE — Discharge Instructions (Addendum)

## 2022-05-07 NOTE — ED Provider Notes (Signed)
[facility] Provider Note    Event Date/Time   First MD Initiated Contact with Patient 05/07/22 [phone]     (approximate)   History   Chest Pain   HPI  Lindsey [name] is a 86 y.o. female whose medical history is notable for prior CAD and NSTEMI; Dr. Rockey Situ is her cardiologist.  She presents for evaluation of chest pain that started earlier tonight.  It went on for about an hour and a half before she decided come to the emergency department.  After getting here it completely resolved and she has no more pain.  She said that it felt like an aching pain in the left side of her chest.  Now her chest wall is tender to palpation but has no pain at rest.  Tonight she had no shortness of breath.  She said that after her heart attack, she would have episodes of dyspnea with exertion that would almost caused her to have a panic attack.  However she said that is gotten a lot better and she usually does not have any issues.  He is otherwise healthy and active.  No recent illness or symptoms such as fever, sore throat, nausea, vomiting, abdominal pain, and dysuria.  No recent trauma or falls.     Physical Exam   Triage Vital Signs: ED Triage Vitals [05/07/22 0110]  Enc Vitals Group     BP (!) 175/106     Pulse Rate 68     Resp 16     Temp 98.2 F (36.8 C)     Temp Source Oral     SpO2 96 %     Weight 56.7 kg (125 lb)     Height 1.575 m ('5\' 2"'$ )     Head Circumference      Peak Flow      Pain Score 0     Pain Loc      Pain Edu?      Excl. in [place]?     Most recent vital signs: Vitals:   05/07/22 0110 05/07/22 0338  BP: (!) 175/106 (!) 170/86  Pulse: 68 (!) 50  Resp: 16 15  Temp: 98.2 F (36.8 C)   SpO2: 96% 97%     General: Awake, no distress.  Generally well-appearing and appears younger than chronological age. CV:  Good peripheral perfusion.  Normal heart sounds. Resp:  Normal effort.  Lungs are clear to auscultation.  No wheezing. Abd:  No  distention.  No tenderness to palpation of the abdomen. Other:  Awake and alert, pleasant and interactive, normal mood and affect.  No focal neurological deficits.   ED Results / Procedures / Treatments   Labs (all labs ordered are listed, but only abnormal results are displayed) Labs Reviewed  BASIC METABOLIC PANEL - Abnormal; Notable for the following components:      Result Value   Glucose, Bld 104 (*)    BUN 28 (*)    All other components within normal limits  TROPONIN I (HIGH SENSITIVITY) - Abnormal; Notable for the following components:   Troponin I (High Sensitivity) 22 (*)    All other components within normal limits  TROPONIN I (HIGH SENSITIVITY) - Abnormal; Notable for the following components:   Troponin I (High Sensitivity) 24 (*)    All other components within normal limits  CBC  PROTIME-INR     EKG  ED ECG REPORT I, Hinda Kehr, the attending physician, personally viewed and interpreted this ECG.  Date:  05/07/2022 EKG Time: 1:07 AM Rate: 63 Rhythm: normal sinus rhythm QRS Axis: normal Intervals: Bifascicular block (right bundle branch block and left anterior fascicular block ST/T Wave abnormalities: Non-specific ST segment / T-wave changes, but no clear evidence of acute ischemia. Narrative Interpretation: no definitive evidence of acute ischemia; does not meet STEMI criteria.    RADIOLOGY I viewed and interpreted the patient's two-view chest x-ray.  I see no evidence of focal consolidation or other acute abnormality.  Radiologist mentions that there is chronic scarring and some long-term changes but no acute abnormalities.    PROCEDURES:  Critical Care performed: No  .1-3 Lead EKG Interpretation  Performed by: Hinda Kehr, MD Authorized by: Hinda Kehr, MD     Interpretation: normal     ECG rate:  60   ECG rate assessment: normal     Rhythm: sinus rhythm     Ectopy: PAC     Conduction: normal      MEDICATIONS ORDERED IN  ED: Medications - No data to display   IMPRESSION / MDM / Nina / ED COURSE  I reviewed the triage vital signs and the nursing notes.                              Differential diagnosis includes, but is not limited to, angina, ACS including unstable angina, PE, pneumonia, musculoskeletal strain, trauma, anxiety.  Patient's presentation is most consistent with acute presentation with potential threat to life or bodily function.  Labs/studies ordered include EKG, two-view chest x-ray, basic metabolic panel, CBC, pro time-INR, high-sensitivity troponin x2.  EKG shows no evidence of ischemia, many premature atrial complexes and bifascicular block, but no significant changes from prior.  Chest x-ray is clear as described above.  Labs are essentially normal.  Her high-sensitivity troponins are 22 and 24, but this is consistent with her values in the past, and I do not feel it is likely that it represents acute coronary syndrome.  This is particular issue given that the patient has had no pain for hours and feels very well.  She also has reproducible tenderness to palpation of the left side of her anterior chest.  I talked with the patient about it and we considered hospitalization, but she would much prefer to go home and follow-up as an outpatient.  Given that her exam and work-up has been reassuring, I think that is very appropriate.  She should be able to follow-up with Dr. Rockey Situ and I will send him a message through [facility] so that he is aware of her ED visit.  I gave her strict return precautions and she understands and agrees with the plan.  No evidence of an emergent medical condition at this time.  The patient was on the cardiac monitor to evaluate for evidence of arrhythmia and/or significant heart rate changes.      FINAL CLINICAL IMPRESSION(S) / ED DIAGNOSES   Final diagnoses:  Chest pain, unspecified type     Rx / DC Orders   ED Discharge Orders     None         Note:  This document was prepared using Dragon voice recognition software and may include unintentional dictation errors.   Hinda Kehr, MD 05/07/22 [phone]

## 2022-05-08 ENCOUNTER — Ambulatory Visit: Payer: Medicare Other | Admitting: Cardiovascular Disease

## 2022-05-08 DIAGNOSIS — I471 Supraventricular tachycardia: Secondary | ICD-10-CM

## 2022-05-08 DIAGNOSIS — I5022 Chronic systolic (congestive) heart failure: Secondary | ICD-10-CM

## 2022-05-08 DIAGNOSIS — I6529 Occlusion and stenosis of unspecified carotid artery: Secondary | ICD-10-CM

## 2022-05-08 DIAGNOSIS — I10 Essential (primary) hypertension: Secondary | ICD-10-CM

## 2022-05-08 DIAGNOSIS — I25708 Atherosclerosis of coronary artery bypass graft(s), unspecified, with other forms of angina pectoris: Secondary | ICD-10-CM

## 2022-05-08 DIAGNOSIS — R0609 Other forms of dyspnea: Secondary | ICD-10-CM

## 2022-05-08 DIAGNOSIS — I739 Peripheral vascular disease, unspecified: Secondary | ICD-10-CM

## 2022-05-08 DIAGNOSIS — R0989 Other specified symptoms and signs involving the circulatory and respiratory systems: Secondary | ICD-10-CM

## 2022-05-08 DIAGNOSIS — J449 Chronic obstructive pulmonary disease, unspecified: Secondary | ICD-10-CM

## 2022-05-08 DIAGNOSIS — R531 Weakness: Secondary | ICD-10-CM

## 2022-05-08 DIAGNOSIS — M7989 Other specified soft tissue disorders: Secondary | ICD-10-CM

## 2022-05-08 NOTE — Telephone Encounter (Signed)
Called to reschedule 2 pm per gollan due to conflicting procedure schedule at [facility].   Patient states this is ridiculous and feels like she keeps getting pushed around.  Patient declines APP visit.   Please call patient .

## 2022-05-11 ENCOUNTER — Telehealth: Payer: Self-pay | Admitting: Cardiovascular Disease

## 2022-05-11 NOTE — Telephone Encounter (Signed)
The patient's PCP, Tracie Harrier, MD nurse called to get patient a sooner appt scheduled. Please call back

## 2022-05-29 ENCOUNTER — Ambulatory Visit: Payer: Medicare Other | Admitting: Family

## 2022-05-29 ENCOUNTER — Other Ambulatory Visit: Payer: Self-pay | Admitting: Cardiovascular Disease

## 2022-05-29 ENCOUNTER — Telehealth: Payer: Self-pay | Admitting: Family

## 2022-05-29 NOTE — Telephone Encounter (Signed)
Patient did not show for her [facility] appointment on 05/29/22. Will attempt to reschedule.

## 2022-06-10 ENCOUNTER — Other Ambulatory Visit: Payer: Self-pay | Admitting: Pulmonary Disease

## 2022-06-10 DIAGNOSIS — R0600 Dyspnea, unspecified: Secondary | ICD-10-CM

## 2022-06-11 NOTE — Progress Notes (Signed)
Cardiology Office Note  Date:  06/12/2022   ID:  Lindsey [name], [place] [birth year], MRN [id number]  PCP:  Tracie Harrier, MD   Chief Complaint  Patient presents with   Shortness of Breath    Medications reviewed by the patient verbally.     HPI:  [name] is a pleasant 86 year old woman with history of  lower extremity swelling,Secondary to venous insufficiency  prior right knee surgery,  right DVT, status post IVC filter,  bronchiectasis on inhalers and nebulizers at home  Back surgery who presents for follow up of her labile blood pressure and her leg swelling.  Last seen in clinic by myself July 2023  Recent trip to [place], took oxygen with her but did not need it Not taking potassium, potassium level 4.4 Thanks Lasix daily Heavy leg bleeding, stopped asa on her own Not sure if she is still on the Plavix  Feels her lung condition is stable  Lab work reviewed A1C 6.1 Total chol 144, LDL 44  EKG personally reviewed by myself on todays visit Normal sinus rhythm rate 61 bpm right bundle branch block, left anterior fascicular block  Other medical history reviewed Er 04/01/22 for SOB intermittently short of breath with exertion for about the past 2 weeks Acute cystitis with hematuria, treated with ABX  Cardiac cath 03/03/22 Severe single vessel coronary artery disease with occlusion of mid LCx with left-to-left and right-to-left collaterals supplying distal OM branches.  Given patient's history of chest pain ~3 weeks ago following by progressive heart failure symptoms, I suspect her initial coronary event occurred at that time. Mild-moderate, non-obstructive CAD involving the LAD and RCA. Mildly-moderately elevated left heart filling pressures. Moderate pulmonary hypertension. Normal Fick cardiac output/index.   Recommendations: aspirin and clopidogrel for 12 months. No role for PCI to occluded LCx at this time, as event likely occurred ~3 weeks ago.  Echo  03/04/22  1. Left ventricular ejection fraction, by estimation, is 40 to 45%. The  left ventricle has mildly decreased function. The left ventricle  demonstrates regional wall motion abnormalities There is mild left  ventricular hypertrophy. Left ventricular diastolic parameters are  consistent with Grade III diastolic dysfunction (restrictive). There is  moderate hypokinesis of the left ventricular, basal-mid inferolateral wall.   2. Right ventricular systolic function is low normal. The right ventricular size is normal.   3. The mitral valve was not well visualized. Mild mitral valve regurgitation.   4. The aortic valve was not well visualized. Aortic valve regurgitation is mild.   5. The inferior vena cava is dilated in size with <50% respiratory variability, suggesting right atrial pressure of 15 mmHg.   [facility] ED on 10/15/21 for evaluation of dizziness.  weakness and disorientation, her legs felt very heavy, and she was not sure if she could take the next step.  There was no evidence of arrhythmia or ischemia on cardiac monitor with the exception of frequent PVCs. Orthostatic VS were unremarkable. Hs troponins were within normal range  Echocardiogram November 24, 2021 Reviewed on today's visit Normal LV function Normal RV function No significant valve disease  Zio monitor reviewed Patient had a min HR of 40 bpm, max HR of 143 bpm, and avg HR of 55 bpm. Predominant underlying rhythm was Sinus Rhythm. 5 Supraventricular Tachycardia runs occurred, the run with  the fastest interval lasting 10 beats with a max rate of 143 bpm (avg 136 bpm); the run with the fastest interval was also the longest.  Supraventricular Tachycardia / Atrial  Tachycardia Isolated SVEs  were frequent (12.9%, [id number]), SVE Couplets were rare (<1.0%, 1059), and SVE Triplets were rare (<1.0%, 17).  Isolated VEs were rare (<1.0%), and no VE Couplets or VE Triplets were present.  Off telmistartan, stopped , "Causes  dizziness"  has carotid disease done through [facility], was told she had moderate disease   PMH:   has a past medical history of Anemia, Atrial tachycardia ([place]), Breast cancer ([place]), Bronchiectasis ([place]) (06/2007), CHF (congestive heart failure) ([place]), Chronic Dizziness, Colitis, COPD (chronic obstructive pulmonary disease) ([place]), Depression, Diastolic dysfunction, DVT (deep venous thrombosis) ([place]), Fibromyalgia, Labile Hypertension, Migraine headache with aura, Osteoarthritis, Osteoporosis, PAD (peripheral artery disease) ([place]), Pneumonia, Polio (1952), and Stenosis of carotid artery.  PSH:    Past Surgical History:  Procedure Laterality Date   ABDOMINAL HYSTERECTOMY     BACK SURGERY     BILATERAL TOTAL MASTECTOMY WITH AXILLARY LYMPH NODE DISSECTION     BREAST SURGERY     CARPAL TUNNEL RELEASE     bilateral    COLONOSCOPY WITH PROPOFOL N/A 08/02/2015   Procedure: COLONOSCOPY WITH PROPOFOL;  Surgeon: Manya Silvas, MD;  Location: [facility] ENDOSCOPY;  Service: Endoscopy;  Laterality: N/A;   FINGER TENDON REPAIR     HERNIA REPAIR     REPLACEMENT TOTAL KNEE     right knee    RIGHT/LEFT HEART CATH AND CORONARY ANGIOGRAPHY N/A 03/03/2022   Procedure: RIGHT/LEFT HEART CATH AND CORONARY ANGIOGRAPHY;  Surgeon: Nelva Bush, MD;  Location: [facility];  Service: Cardiovascular;  Laterality: N/A;   SQUAMOUS CELL CARCINOMA EXCISION     TONSILLECTOMY     TOTAL KNEE ARTHROPLASTY Left 09/19/2020   Procedure: TOTAL KNEE ARTHROPLASTY;  Surgeon: Hessie Knows, MD;  Location: [facility] ORS;  Service: Orthopedics;  Laterality: Left;   UMBILICAL HERNIA REPAIR     VAGINAL HYSTERECTOMY      Current Outpatient Medications on File Prior to Visit  Medication Sig Dispense Refill   acetaminophen (TYLENOL) 500 MG tablet Take 500 mg by mouth at bedtime as needed for moderate pain.     albuterol (VENTOLIN HFA) 108 (90 Base) MCG/ACT inhaler Inhale 2 puffs into the lungs every 6 (six) hours as needed for  wheezing or shortness of breath.     ARIPiprazole (ABILIFY) 2 MG tablet Take 2 mg by mouth daily.      aspirin EC 81 MG tablet Take 1 tablet (81 mg total) by mouth daily. Swallow whole. 30 tablet 12   atorvastatin (LIPITOR) 40 MG tablet Take 1 tablet (40 mg total) by mouth daily. 90 tablet 1   clopidogrel (PLAVIX) 75 MG tablet Take 1 tablet (75 mg total) by mouth daily with breakfast. 90 tablet 1   cyclobenzaprine (FLEXERIL) 5 MG tablet Take 5 mg by mouth at bedtime as needed.     ezetimibe (ZETIA) 10 MG tablet Take 1 tablet (10 mg total) by mouth daily. 90 tablet 0   furosemide (LASIX) 40 MG tablet TAKE ONE TABLET (40 MG) BY MOUTH EVERY DAY 30 tablet 0   hydrOXYzine (ATARAX/VISTARIL) 25 MG tablet Take 25 mg by mouth as needed for anxiety.     losartan (COZAAR) 25 MG tablet Take 1 tablet (25 mg total) by mouth daily. 90 tablet 1   Multiple Vitamins-Minerals (PRESERVISION AREDS 2 PO) Take by mouth.     Omega-3 Fatty Acids (FISH OIL OMEGA-3 PO) Take 2 capsules by mouth at bedtime.     polyethylene glycol (MIRALAX / GLYCOLAX) packet Take 17 g by  mouth daily as needed for mild constipation or moderate constipation.      rizatriptan (MAXALT) 5 MG tablet Take 5 mg by mouth as needed for migraine. As needed. Needed 3 times in past 3 months     sodium chloride HYPERTONIC 3 % nebulizer solution Take 5 mLs by nebulization every 4 (four) hours as needed.     SYMBICORT 160-4.5 MCG/ACT inhaler SMARTSIG:2 Inhalation Via Inhaler Twice Daily     venlafaxine XR (EFFEXOR-XR) 150 MG 24 hr capsule Take 150 mg by mouth daily.     vitamin B-6 (PYRIDOXINE) 25 MG tablet Take 2.5 mg by mouth daily. (Patient not taking: Reported on 06/12/2022)     No current facility-administered medications on file prior to visit.    Allergies:   Ivp dye [iodinated contrast media], Charentais melon (french melon), Codeine, Cucumber extract, Demeclocycline, Galcanezumab-gnlm, Sulfa antibiotics, Tegaderm ag mesh [silver], Telmisartan,  Tetracyclines & related, and Wild lettuce extract (lactuca virosa)   Social History:  The patient  reports that she has never smoked. She has never used smokeless tobacco. She reports current alcohol use of about 4.0 standard drinks of alcohol per week. She reports that she does not use drugs.   Family History:   family history includes Arthritis in her father and mother; Atrial fibrillation in her sister; Bladder Cancer in her father; Pancreatic cancer in her mother.    Review of Systems: Review of Systems  Constitutional: Negative.   HENT: Negative.    Respiratory:  Positive for shortness of breath.   Cardiovascular: Negative.   Gastrointestinal: Negative.   Neurological:  Positive for dizziness.  Psychiatric/Behavioral: Negative.    All other systems reviewed and are negative.   PHYSICAL EXAM: VS:  BP 108/70 (BP Location: Left Arm, Patient Position: Sitting, Cuff Size: Normal)   Pulse 61   Ht '5\' 2"'$  (1.575 m)   Wt 130 lb 2 oz (59 kg)   SpO2 97%   BMI 23.80 kg/m  , BMI Body mass index is 23.8 kg/m.  Constitutional:  oriented to person, place, and time. No distress.  HENT:  Head: Grossly normal Eyes:  no discharge. No scleral icterus.  Neck: No JVD, no carotid bruits  Cardiovascular: Regular rate and rhythm, no murmurs appreciated Pulmonary/Chest: Clear to auscultation bilaterally, no wheezes or rails Abdominal: Soft.  no distension.  no tenderness.  Musculoskeletal: Normal range of motion Neurological:  normal muscle tone. Coordination normal. No atrophy Skin: Skin warm and dry Psychiatric: normal affect, pleasant   Recent Labs: 04/11/2022: B Natriuretic Peptide 985.2 04/13/2022: ALT 54 04/14/2022: Magnesium 2.4 05/07/2022: BUN 28; Creatinine, Ser 0.71; Hemoglobin 12.6; Platelets 214; Potassium 3.7; Sodium 135    Lipid Panel Lab Results  Component Value Date   CHOL 186 03/02/2022   HDL 70 03/02/2022   LDLCALC 109 (H) 03/02/2022   TRIG 37 03/02/2022    Wt Readings  from Last 3 Encounters:  06/12/22 130 lb 2 oz (59 kg)  05/07/22 125 lb (56.7 kg)  04/14/22 126 lb 15.8 oz (57.6 kg)     ASSESSMENT AND PLAN:  Coronary artery disease with stable angina Catheterization May 2023, occluded circumflex with collaterals Recommend she stay on Lipitor Zetia, cholesterol now at goal Ambulating well without anginal symptoms We will hold aspirin given leg bleeding, continue Plavix  Labile blood pressure Blood pressure is well controlled on today's visit. No changes made to the medications.  Recommend losartan in the evening given orthostasis symptoms  History of DVT (deep vein thrombosis) No  recurrent episodes, stable  Bronchiectasis without complication (HCC) chronic shortness of breath, on oxygen as needed Has previously required steroids and antibiotics Followed by pulmonary Symptoms currently stable  Leg swelling taking Lasix 20 mg,  Stable renal function, not requiring potassium  PAD/carotid disease Goal LDL less than 70, currently at goal  Atrial tachycardia Continue bisoprolol 5 mg daily,  Denies tachycardia Prior Zio monitor  No changes    Total encounter time more than 30 minutes  Greater than 50% was spent in counseling and coordination of care with the patient   Orders Placed This Encounter  Procedures   EKG 12-Lead      Signed, Esmond Plants, M.D., Ph.D. 06/12/2022  [place], [place]

## 2022-06-12 ENCOUNTER — Ambulatory Visit: Payer: Medicare Other | Attending: Cardiovascular Disease | Admitting: Cardiovascular Disease

## 2022-06-12 ENCOUNTER — Encounter: Payer: Self-pay | Admitting: Cardiovascular Disease

## 2022-06-12 VITALS — BP 108/70 | HR 61 | Ht 62.0 in | Wt 130.1 lb

## 2022-06-12 DIAGNOSIS — M7989 Other specified soft tissue disorders: Secondary | ICD-10-CM

## 2022-06-12 DIAGNOSIS — R0989 Other specified symptoms and signs involving the circulatory and respiratory systems: Secondary | ICD-10-CM

## 2022-06-12 DIAGNOSIS — I6529 Occlusion and stenosis of unspecified carotid artery: Secondary | ICD-10-CM

## 2022-06-12 DIAGNOSIS — I471 Supraventricular tachycardia: Secondary | ICD-10-CM | POA: Diagnosis not present

## 2022-06-12 DIAGNOSIS — I10 Essential (primary) hypertension: Secondary | ICD-10-CM

## 2022-06-12 DIAGNOSIS — R0609 Other forms of dyspnea: Secondary | ICD-10-CM

## 2022-06-12 DIAGNOSIS — R531 Weakness: Secondary | ICD-10-CM

## 2022-06-12 DIAGNOSIS — I5022 Chronic systolic (congestive) heart failure: Secondary | ICD-10-CM

## 2022-06-12 DIAGNOSIS — I25708 Atherosclerosis of coronary artery bypass graft(s), unspecified, with other forms of angina pectoris: Secondary | ICD-10-CM

## 2022-06-12 DIAGNOSIS — J449 Chronic obstructive pulmonary disease, unspecified: Secondary | ICD-10-CM

## 2022-06-12 DIAGNOSIS — I739 Peripheral vascular disease, unspecified: Secondary | ICD-10-CM

## 2022-06-12 NOTE — Patient Instructions (Addendum)
Medication Instructions:  No changes  Check and make sure you are taking plavix/clopidogrel  Ok to stop aspirin Move the losartan to the evening  If you need a refill on your cardiac medications before your next appointment, please call your pharmacy.   Lab work: No new labs needed  Testing/Procedures: No new testing needed  Follow-Up: At [facility], you and your health needs are our priority.  As part of our continuing mission to provide you with exceptional heart care, we have created designated Provider Care Teams.  These Care Teams include your primary Cardiologist (physician) and Advanced Practice Providers (APPs -  Physician Assistants and Nurse Practitioners) who all work together to provide you with the care you need, when you need it.  You will need a follow up appointment in 6 months  Providers on your designated Care Team:   Murray Hodgkins, NP Christell Faith, PA-C Cadence Kathlen Mody, [place]  COVID-19 Vaccine Information can be found at: [email or web address].uk For questions related to vaccine distribution or appointments, please email [email or web address] or call [phone].

## 2022-06-17 ENCOUNTER — Other Ambulatory Visit: Payer: Self-pay | Admitting: Cardiovascular Disease

## 2022-06-17 NOTE — Telephone Encounter (Signed)
Pt is taking Potassium 20 meq qd.  Pt isn't needing a refill at this time.

## 2022-06-17 NOTE — Telephone Encounter (Signed)
Verify potassium not on pt's current medication list

## 2022-06-18 ENCOUNTER — Ambulatory Visit
Admission: RE | Admit: 2022-06-18 | Discharge: 2022-06-18 | Disposition: A | Discharge status: Home / Self Care | Payer: Medicare Other | Source: Ambulatory Visit | Attending: Pulmonary Disease | Admitting: Pulmonary Disease

## 2022-06-18 DIAGNOSIS — R0600 Dyspnea, unspecified: Secondary | ICD-10-CM | POA: Diagnosis present

## 2022-06-18 MED ORDER — IOHEXOL 350 MG/ML SOLN
60.0000 mL | Freq: Once | INTRAVENOUS | Status: AC | PRN
  Administered 2022-06-18: 60 mL via INTRAVENOUS

## 2022-07-15 ENCOUNTER — Telehealth: Payer: Self-pay | Admitting: Cardiovascular Disease

## 2022-07-15 NOTE — Telephone Encounter (Signed)
Pt c/o medication issue:  1. Name of Medication:  bisoprolol (ZEBETA) 5 MG tablet  2. How are you currently taking this medication (dosage and times per day)?   3. Are you having a reaction (difficulty breathing--STAT)?   4. What is your medication issue?   Patient states she has been feeling exhausted recently and her pulmonologist recommended stopping Bisoprolol. She states she has been off it for about 5 days and she has been SOB for the past 3 days.   Pt c/o Shortness Of Breath: STAT if SOB developed within the last 24 hours or pt is noticeably SOB on the phone  1. Are you currently SOB (can you hear that pt is SOB on the phone)?  No   2. How long have you been experiencing SOB?  Past 3 days   3. Are you SOB when sitting or when up moving around?  When up and moving around  4. Are you currently experiencing any other symptoms?   Exhaustion/fatigue

## 2022-07-15 NOTE — Telephone Encounter (Signed)
I spoke with the patient.  She states she has been having complaints of fatigue and when she saw her pulmonologist recently they advised it could be coming from bisoprolol.  Per the patient, she stopped bisoprolol on her own x 5 days now. Her fatigue is slightly improved but now she is SOB.  She has a history of atrial tachycardia.   BP/ HR readings at home have been "stable" per the patient. 109/61/ HR in the 60's.  She does confirm some dizziness/ lightheadedness along with her SOB.  The patient confirms she is also taking lasix 40 mg once daily & losartan 25 mg once daily.  I advised the patient I wanted to see if Dr. Rockey Situ was still in the office to review further and would call her back.  She is agreeable.

## 2022-07-15 NOTE — Telephone Encounter (Signed)
I spoke with the patient. I advised her that Dr. Rockey Situ had already left for the day so I will be sending him this message to review and advise further.   She is aware that I am uncertain as to why her SOB got worse off the bisoprolol when her HR's are unchanged, so I will need to clarify further with Dr. Rockey Situ. She is aware that it may be that it may be she could restart bisoprolol at 2.5 mg once daily to see how she does, but will clarify further and call back.  The patient voices understanding and is agreeable.

## 2022-07-17 NOTE — Telephone Encounter (Signed)
Attempted to contact the patient with Dr. Rockey Situ recommendations.  No answer- the voice mail box is full.   Will call back at a later time.

## 2022-07-17 NOTE — Telephone Encounter (Signed)
Minna Merritts, MD  Sent: Fri July 17, 2022  3:01 PM  To: Emily Filbert, RN          Message  Bisoprolol dose is low and should not cause dramatic symptoms   If she feels better on it, options would be to take the bisoprolol daily at nighttime if there is fatigue  I would not expect holding it to contribute to shortness of breath  There is a history of bronchiectasis which could contribute to breathing issues, sees a normal allergies have also started to pick up which could contribute to the breathing  Thx  TGollan

## 2022-07-21 NOTE — Telephone Encounter (Signed)
I spoke with the patient. I have advised her of Dr. Donivan Scull recommendations as stated below. The patient voices understanding.  She confirms she still has some fatigue and her SOB is "better some days then others."  I have advised the patient that she could: - resume bisoprolol at 2.5 mg once daily at night - if tolerating she can increase to 5 mg once daily at night  The patient again voices understanding and is agreeable.  She is advised to call back with any further questions/ concerns.

## 2022-08-10 ENCOUNTER — Other Ambulatory Visit: Payer: Self-pay | Admitting: Cardiovascular Disease

## 2022-09-04 ENCOUNTER — Other Ambulatory Visit: Payer: Self-pay | Admitting: Cardiovascular Disease

## 2022-09-09 ENCOUNTER — Other Ambulatory Visit: Payer: Self-pay | Admitting: Cardiovascular Disease

## 2022-10-13 ENCOUNTER — Other Ambulatory Visit: Payer: Self-pay | Admitting: Cardiovascular Disease

## 2022-10-13 NOTE — Telephone Encounter (Signed)
Pt taking Potassium 10 meq daily with Furosemide. Potassium not currently on pt's medication list of last ov note medication list. Please advise if ok to refill.

## 2022-10-13 NOTE — Telephone Encounter (Signed)
Reviewed the patient's chart.  04/14/22- she was discharged from the hospital on potassium 10 meq once daily 06/12/22- she was seen in the office with Dr. Rockey Situ and her historical medication list states she was not taking potassium at the time of that visit.  Has it been confirmed with the patient that she is taking potassium 10 meq once daily?   If she has confirmed this, then ok to give enough refills to get her to her appointment in March with Dr. Rockey Situ.   Thank you!

## 2022-10-20 ENCOUNTER — Other Ambulatory Visit: Payer: Self-pay | Admitting: Cardiovascular Disease

## 2022-11-13 ENCOUNTER — Other Ambulatory Visit: Payer: Self-pay | Admitting: Cardiovascular Disease

## 2022-12-13 NOTE — Progress Notes (Unsigned)
Cardiology Office Note  Date:  12/14/2022   ID:  Lindsey [name], Lindsey [name] 1936/07/03, MRN [id number]  PCP:  Tracie Harrier, MD   Chief Complaint  Patient presents with   6 month follow up     "Doing well." Medications reviewed by the patient verbally.    HPI:  Ms Freeberg is a pleasant 87 year old woman with history of  CAD,  occlusion of mid LCx  5/23 lower extremity swelling,Secondary to venous insufficiency  prior right knee surgery,  right DVT, status post IVC filter,  bronchiectasis on inhalers and nebulizers at home  Back surgery Echo 5/23: EF 40 to 45% who presents for follow up of her labile blood pressure and her leg swelling.  Last seen in clinic by myself September 2023 Moving  at the [facility] to an apartment On second floor Still unpacking In general has been feeling well with no complaints  Chest pain last night, not severe 15 min Under anterior left ribs Typically does not have symptoms on exertion  Not on oxygen, Feels her lung condition is stable Remains on Plavix with no aspirin, aspirin previously held for  bleeding issues  Reports having leg cramping, feels this could be coming from her statin  Lab work reviewed A1C 6.0 Total chol 148, LDL 47  EKG personally reviewed by myself on todays visit Normal sinus rhythm rate 63 bpm right bundle branch block, left anterior fascicular block  Other medical history reviewed Er 04/01/22 for SOB intermittently short of breath with exertion for about the past 2 weeks Acute cystitis with hematuria, treated with ABX  Cardiac cath 03/03/22 Severe single vessel coronary artery disease with occlusion of mid LCx with left-to-left and right-to-left collaterals supplying distal OM branches.  Given patient's history of chest pain ~3 weeks ago following by progressive heart failure symptoms, I suspect her initial coronary event occurred at that time. Mild-moderate, non-obstructive CAD involving the LAD and  RCA. Mildly-moderately elevated left heart filling pressures. Moderate pulmonary hypertension. Normal Fick cardiac output/index.   Recommendations: aspirin and clopidogrel for 12 months. No role for PCI to occluded LCx at this time, as event likely occurred ~3 weeks ago.  Echo 03/04/22  1. Left ventricular ejection fraction, by estimation, is 40 to 45%. The  left ventricle has mildly decreased function. The left ventricle  demonstrates regional wall motion abnormalities There is mild left  ventricular hypertrophy. Left ventricular diastolic parameters are  consistent with Grade III diastolic dysfunction (restrictive). There is  moderate hypokinesis of the left ventricular, basal-mid inferolateral wall.   2. Right ventricular systolic function is low normal. The right ventricular size is normal.   3. The mitral valve was not well visualized. Mild mitral valve regurgitation.   4. The aortic valve was not well visualized. Aortic valve regurgitation is mild.   5. The inferior vena cava is dilated in size with <50% respiratory variability, suggesting right atrial pressure of 15 mmHg.   [facility] ED on 10/15/21 for evaluation of dizziness.  weakness and disorientation, her legs felt very heavy, and she was not sure if she could take the next step.  There was no evidence of arrhythmia or ischemia on cardiac monitor with the exception of frequent PVCs. Orthostatic VS were unremarkable. Hs troponins were within normal range  Echocardiogram November 24, 2021 Reviewed on today's visit Normal LV function Normal RV function No significant valve disease  Zio monitor reviewed Patient had a min HR of 40 bpm, max HR of 143 bpm, and avg HR of  55 bpm. Predominant underlying rhythm was Sinus Rhythm. 5 Supraventricular Tachycardia runs occurred, the run with  the fastest interval lasting 10 beats with a max rate of 143 bpm (avg 136 bpm); the run with the fastest interval was also the longest.  Supraventricular  Tachycardia / Atrial Tachycardia Isolated SVEs  were frequent (12.9%, [id number]), SVE Couplets were rare (<1.0%, 1059), and SVE Triplets were rare (<1.0%, 17).  Isolated VEs were rare (<1.0%), and no VE Couplets or VE Triplets were present.  Off telmistartan, stopped , "Causes dizziness"  has carotid disease done through [facility], was told she had moderate disease   PMH:   has a past medical history of Anemia, Atrial tachycardia, Breast cancer ([place]), Bronchiectasis ([place]) (06/2007), CHF (congestive heart failure) ([place]), Chronic Dizziness, Colitis, COPD (chronic obstructive pulmonary disease) ([place]), Depression, Diastolic dysfunction, DVT (deep venous thrombosis) ([place]), Fibromyalgia, Labile Hypertension, Migraine headache with aura, Osteoarthritis, Osteoporosis, PAD (peripheral artery disease) ([place]), Pneumonia, Polio (1952), and Stenosis of carotid artery.  PSH:    Past Surgical History:  Procedure Laterality Date   ABDOMINAL HYSTERECTOMY     BACK SURGERY     BILATERAL TOTAL MASTECTOMY WITH AXILLARY LYMPH NODE DISSECTION     BREAST SURGERY     CARPAL TUNNEL RELEASE     bilateral    COLONOSCOPY WITH PROPOFOL N/A 08/02/2015   Procedure: COLONOSCOPY WITH PROPOFOL;  Surgeon: Manya Silvas, MD;  Location: [facility] ENDOSCOPY;  Service: Endoscopy;  Laterality: N/A;   FINGER TENDON REPAIR     HERNIA REPAIR     REPLACEMENT TOTAL KNEE     right knee    RIGHT/LEFT HEART CATH AND CORONARY ANGIOGRAPHY N/A 03/03/2022   Procedure: RIGHT/LEFT HEART CATH AND CORONARY ANGIOGRAPHY;  Surgeon: Nelva Bush, MD;  Location: [facility];  Service: Cardiovascular;  Laterality: N/A;   SQUAMOUS CELL CARCINOMA EXCISION     TONSILLECTOMY     TOTAL KNEE ARTHROPLASTY Left 09/19/2020   Procedure: TOTAL KNEE ARTHROPLASTY;  Surgeon: Hessie Knows, MD;  Location: [facility] ORS;  Service: Orthopedics;  Laterality: Left;   UMBILICAL HERNIA REPAIR     VAGINAL HYSTERECTOMY      Current Outpatient Medications on File  Prior to Visit  Medication Sig Dispense Refill   acetaminophen (TYLENOL) 500 MG tablet Take 500 mg by mouth at bedtime as needed for moderate pain.     albuterol (VENTOLIN HFA) 108 (90 Base) MCG/ACT inhaler Inhale 2 puffs into the lungs every 6 (six) hours as needed for wheezing or shortness of breath.     ARIPiprazole (ABILIFY) 2 MG tablet Take 2 mg by mouth daily.      atorvastatin (LIPITOR) 40 MG tablet Take 40 mg by mouth 3 (three) times a week.     bisoprolol (ZEBETA) 5 MG tablet TAKE 1 TABLET BY MOUTH DAILY 90 tablet 0   clopidogrel (PLAVIX) 75 MG tablet TAKE ONE TABLET (75 MG) BY MOUTH EVERY DAY WITH BREAKFAST 90 tablet 0   cyclobenzaprine (FLEXERIL) 5 MG tablet Take 5 mg by mouth at bedtime as needed.     doxazosin (CARDURA) 2 MG tablet Take 2 mg by mouth daily.     ezetimibe (ZETIA) 10 MG tablet TAKE 1 TABLET BY MOUTH DAILY. 90 tablet 0   furosemide (LASIX) 40 MG tablet TAKE ONE TABLET (40 MG) BY MOUTH EVERY DAY 30 tablet 0   hydrOXYzine (ATARAX/VISTARIL) 25 MG tablet Take 25 mg by mouth as needed for anxiety.     losartan (COZAAR) 25 MG tablet Take  1 tablet (25 mg total) by mouth daily. 90 tablet 1   Multiple Vitamins-Minerals (PRESERVISION AREDS 2 PO) Take by mouth.     Omega-3 Fatty Acids (FISH OIL OMEGA-3 PO) Take 2 capsules by mouth at bedtime.     polyethylene glycol (MIRALAX / GLYCOLAX) packet Take 17 g by mouth daily as needed for mild constipation or moderate constipation.      potassium chloride (KLOR-CON M) 10 MEQ tablet TAKE 1 TABLET BY MOUTH ONCE DAILY 90 tablet 0   rizatriptan (MAXALT) 5 MG tablet Take 5 mg by mouth as needed for migraine. As needed. Needed 3 times in past 3 months     sodium chloride HYPERTONIC 3 % nebulizer solution Take 5 mLs by nebulization every 4 (four) hours as needed.     SYMBICORT 160-4.5 MCG/ACT inhaler SMARTSIG:2 Inhalation Via Inhaler Twice Daily     venlafaxine XR (EFFEXOR-XR) 150 MG 24 hr capsule Take 150 mg by mouth daily.     vitamin B-6  (PYRIDOXINE) 25 MG tablet Take 2.5 mg by mouth daily.     No current facility-administered medications on file prior to visit.    Allergies:   Ivp dye [iodinated contrast media], Charentais melon (french melon), Codeine, Cucumber extract, Demeclocycline, Galcanezumab-gnlm, Sulfa antibiotics, Tegaderm ag mesh [silver], Telmisartan, Tetracyclines & related, and Wild lettuce extract (lactuca virosa)   Social History:  The patient  reports that she has never smoked. She has never used smokeless tobacco. She reports current alcohol use of about 4.0 standard drinks of alcohol per week. She reports that she does not use drugs.   Family History:   family history includes Arthritis in her father and mother; Atrial fibrillation in her sister; Bladder Cancer in her father; Pancreatic cancer in her mother.    Review of Systems: Review of Systems  Constitutional: Negative.   HENT: Negative.    Respiratory:  Positive for shortness of breath.   Cardiovascular: Negative.   Gastrointestinal: Negative.   Neurological: Negative.   Psychiatric/Behavioral: Negative.    All other systems reviewed and are negative.   PHYSICAL EXAM: VS:  BP 124/62 (BP Location: Left Arm, Patient Position: Sitting, Cuff Size: Normal)   Pulse 63   Ht '5\' 2"'$  (1.575 m)   Wt 126 lb 2 oz (57.2 kg)   SpO2 93%   BMI 23.07 kg/m  , BMI Body mass index is 23.07 kg/m.  Constitutional:  oriented to person, place, and time. No distress.  HENT:  Head: Grossly normal Eyes:  no discharge. No scleral icterus.  Neck: No JVD, no carotid bruits  Cardiovascular: Regular rate and rhythm, no murmurs appreciated Pulmonary/Chest: Clear to auscultation bilaterally, no wheezes or rails Abdominal: Soft.  no distension.  no tenderness.  Musculoskeletal: Normal range of motion Neurological:  normal muscle tone. Coordination normal. No atrophy Skin: Skin warm and dry Psychiatric: normal affect, pleasant  Recent Labs: 04/11/2022: B Natriuretic  Peptide 985.2 04/13/2022: ALT 54 04/14/2022: Magnesium 2.4 05/07/2022: BUN 28; Creatinine, Ser 0.71; Hemoglobin 12.6; Platelets 214; Potassium 3.7; Sodium 135    Lipid Panel Lab Results  Component Value Date   CHOL 186 03/02/2022   HDL 70 03/02/2022   LDLCALC 109 (H) 03/02/2022   TRIG 37 03/02/2022    Wt Readings from Last 3 Encounters:  12/14/22 126 lb 2 oz (57.2 kg)  06/12/22 130 lb 2 oz (59 kg)  05/07/22 125 lb (56.7 kg)     ASSESSMENT AND PLAN:  Coronary artery disease with stable angina Catheterization May  2023, occluded circumflex with collaterals Recommend she continue Zetia Given leg cramping concerning for statin myalgias, we will prescribe Repatha Denies anginal symptoms, continue Plavix  Labile blood pressure Blood pressure is well controlled on today's visit. No changes made to the medications.  History of DVT (deep vein thrombosis) No recurrent episodes, stable  Bronchiectasis without complication (HCC) chronic shortness of breath, on oxygen as needed Has previously required steroids and antibiotics Followed by pulmonary Stable symptoms  Leg swelling taking Lasix 40 mg,  Stable renal function,  Would likely benefit from potassium 10 mill equivalents daily  PAD/carotid disease Goal LDL less than 70, currently at goal Zetia, new prescription for Repatha  Atrial tachycardia Continue bisoprolol 5 mg daily,  Denies tachycardia Prior Zio monitor, no new symptoms  Statin myalgias Will wean off statin, Lipitor    Total encounter time more than 30 minutes  Greater than 50% was spent in counseling and coordination of care with the patient   Orders Placed This Encounter  Procedures   EKG 12-Lead      Signed, Esmond Plants, M.D., Ph.D. 12/14/2022  [place], [place]

## 2022-12-14 ENCOUNTER — Encounter: Payer: Self-pay | Admitting: Cardiovascular Disease

## 2022-12-14 ENCOUNTER — Ambulatory Visit: Payer: Medicare Other | Attending: Cardiovascular Disease | Admitting: Cardiovascular Disease

## 2022-12-14 VITALS — BP 124/62 | HR 63 | Ht 62.0 in | Wt 126.1 lb

## 2022-12-14 DIAGNOSIS — I4719 Other supraventricular tachycardia: Secondary | ICD-10-CM

## 2022-12-14 DIAGNOSIS — J449 Chronic obstructive pulmonary disease, unspecified: Secondary | ICD-10-CM

## 2022-12-14 DIAGNOSIS — R0609 Other forms of dyspnea: Secondary | ICD-10-CM

## 2022-12-14 DIAGNOSIS — I739 Peripheral vascular disease, unspecified: Secondary | ICD-10-CM

## 2022-12-14 DIAGNOSIS — I10 Essential (primary) hypertension: Secondary | ICD-10-CM

## 2022-12-14 DIAGNOSIS — R0989 Other specified symptoms and signs involving the circulatory and respiratory systems: Secondary | ICD-10-CM

## 2022-12-14 DIAGNOSIS — M791 Myalgia, unspecified site: Secondary | ICD-10-CM

## 2022-12-14 DIAGNOSIS — J479 Bronchiectasis, uncomplicated: Secondary | ICD-10-CM

## 2022-12-14 DIAGNOSIS — I5022 Chronic systolic (congestive) heart failure: Secondary | ICD-10-CM | POA: Diagnosis not present

## 2022-12-14 DIAGNOSIS — I25708 Atherosclerosis of coronary artery bypass graft(s), unspecified, with other forms of angina pectoris: Secondary | ICD-10-CM | POA: Diagnosis not present

## 2022-12-14 DIAGNOSIS — T466X5A Adverse effect of antihyperlipidemic and antiarteriosclerotic drugs, initial encounter: Secondary | ICD-10-CM

## 2022-12-14 MED ORDER — REPATHA SURECLICK 140 MG/ML ~~LOC~~ SOAJ: 140.0000 mg | SUBCUTANEOUS | 12 refills | Status: DC

## 2022-12-14 NOTE — Patient Instructions (Addendum)
Medication Instructions:  Repatha 140 mg sq once every two weeks When you are on the shot, we can stop the atorvastatin  Continue losartan 25 mg daily  If you need a refill on your cardiac medications before your next appointment, please call your pharmacy.   Lab work: No new labs needed  Testing/Procedures: No new testing needed  Follow-Up: At [facility], you and your health needs are our priority.  As part of our continuing mission to provide you with exceptional heart care, we have created designated Provider Care Teams.  These Care Teams include your primary Cardiologist (physician) and Advanced Practice Providers (APPs -  Physician Assistants and Nurse Practitioners) who all work together to provide you with the care you need, when you need it.  You will need a follow up appointment in 6 months  Providers on your designated Care Team:   Murray Hodgkins, NP Christell Faith, PA-C Cadence Kathlen Mody, [place]  COVID-19 Vaccine Information can be found at: [email or web address] For questions related to vaccine distribution or appointments, please email [email or web address] or call [phone].

## 2022-12-15 ENCOUNTER — Other Ambulatory Visit: Payer: Self-pay | Admitting: Cardiovascular Disease

## 2023-01-11 ENCOUNTER — Other Ambulatory Visit: Payer: Self-pay | Admitting: Cardiovascular Disease

## 2023-01-20 ENCOUNTER — Other Ambulatory Visit: Payer: Self-pay | Admitting: Cardiovascular Disease

## 2023-02-11 ENCOUNTER — Telehealth: Payer: Self-pay | Admitting: Cardiovascular Disease

## 2023-02-11 ENCOUNTER — Encounter: Payer: Self-pay | Admitting: Cardiology

## 2023-02-11 ENCOUNTER — Other Ambulatory Visit: Payer: Self-pay | Admitting: Cardiovascular Disease

## 2023-02-11 ENCOUNTER — Other Ambulatory Visit
Admission: RE | Admit: 2023-02-11 | Discharge: 2023-02-11 | Disposition: A | Discharge status: Home / Self Care | Payer: Medicare Other | Source: Ambulatory Visit | Attending: Cardiology | Admitting: Cardiology

## 2023-02-11 ENCOUNTER — Ambulatory Visit: Payer: Medicare Other | Attending: Cardiology | Admitting: Cardiology

## 2023-02-11 VITALS — BP 109/72 | HR 61 | Ht 62.0 in | Wt 125.0 lb

## 2023-02-11 DIAGNOSIS — R0609 Other forms of dyspnea: Secondary | ICD-10-CM | POA: Diagnosis present

## 2023-02-11 DIAGNOSIS — I4719 Other supraventricular tachycardia: Secondary | ICD-10-CM | POA: Diagnosis present

## 2023-02-11 DIAGNOSIS — I25708 Atherosclerosis of coronary artery bypass graft(s), unspecified, with other forms of angina pectoris: Secondary | ICD-10-CM | POA: Diagnosis present

## 2023-02-11 DIAGNOSIS — E782 Mixed hyperlipidemia: Secondary | ICD-10-CM

## 2023-02-11 DIAGNOSIS — I25118 Atherosclerotic heart disease of native coronary artery with other forms of angina pectoris: Secondary | ICD-10-CM

## 2023-02-11 DIAGNOSIS — I5022 Chronic systolic (congestive) heart failure: Secondary | ICD-10-CM

## 2023-02-11 DIAGNOSIS — R531 Weakness: Secondary | ICD-10-CM

## 2023-02-11 DIAGNOSIS — I10 Essential (primary) hypertension: Secondary | ICD-10-CM

## 2023-02-11 DIAGNOSIS — R42 Dizziness and giddiness: Secondary | ICD-10-CM

## 2023-02-11 DIAGNOSIS — I6529 Occlusion and stenosis of unspecified carotid artery: Secondary | ICD-10-CM | POA: Diagnosis present

## 2023-02-11 DIAGNOSIS — I739 Peripheral vascular disease, unspecified: Secondary | ICD-10-CM

## 2023-02-11 LAB — BASIC METABOLIC PANEL
Anion gap: 9 (ref 5–15)
BUN: 18 mg/dL (ref 8–23)
CO2: 31 mmol/L (ref 22–32)
Calcium: 9.1 mg/dL (ref 8.9–10.3)
Chloride: 96 mmol/L — ABNORMAL LOW (ref 98–111)
Creatinine, Ser: 0.58 mg/dL (ref 0.44–1.00)
GFR, Estimated: >60 mL/min (ref >60)
Glucose, Bld: 99 mg/dL (ref 70–99)
Potassium: 3.9 mmol/L (ref 3.5–5.1)
Sodium: 136 mmol/L (ref 135–145)

## 2023-02-11 LAB — CBC
HCT: 41.1 % (ref 36.0–46.0)
Hemoglobin: 13.1 g/dL (ref 12.0–15.0)
MCH: 28.7 pg (ref 26.0–34.0)
MCHC: 31.9 g/dL (ref 30.0–36.0)
MCV: 90.1 fL (ref 80.0–100.0)
Platelets: 223 10*3/uL (ref 150–400)
RBC: 4.56 MIL/uL (ref 3.87–5.11)
RDW: 14.5 % (ref 11.5–15.5)
WBC: 6.2 10*3/uL (ref 4.0–10.5)
nRBC: 0 % (ref 0.0–0.2)

## 2023-02-11 LAB — D-DIMER, QUANTITATIVE: D-Dimer, Quant: 0.63 ug/mL-FEU — ABNORMAL HIGH (ref 0.00–0.50)

## 2023-02-11 LAB — TROPONIN I (HIGH SENSITIVITY): Troponin I (High Sensitivity): 14 ng/L (ref <18)

## 2023-02-11 LAB — BRAIN NATRIURETIC PEPTIDE: B Natriuretic Peptide: 245.8 pg/mL — ABNORMAL HIGH (ref 0.0–100.0)

## 2023-02-11 NOTE — Progress Notes (Signed)
Cardiology Office Note:   Date:  02/11/2023  ID:  MALAJAH KADRMAS, DOB [birth year], MRN [id number]  History of Present Illness:   Lindsey [name] is a 87 y.o. female with a past medical history of coronary artery disease, peripheral edema with venous insufficiency, right DVT status post IVC filter, bronchiectasis on inhalers and nebulizers, previous back surgery, chronic systolic heart failure, atrial tachycardia, COPD, PAD, who is being seen today as a work in for chest discomfort, shortness of breath, and dizziness.  She was admitted to [facility] on 03/01/2022 for acute on chronic CHF-with an NSTEMI.  High-sensitivity troponin peaked at 1044, BNP was elevated at 1740.  She also had mildly elevated LFTs.  Chest x-ray revealed cardiomegaly with mild diffuse interstitial opacity and small pleural effusions consistent with edema.  Twelve-lead EKG revealed PACs, left anterior fascicular block, chronic RBBB and T wave inversions in inferolateral leads.  She was started on heparin and IV Lasix.  She underwent right and left heart catheterization that showed severe single-vessel coronary artery disease with complete occlusion of the mid left circumflex with left to left and right to left collaterals.  Cardiology recommended medical management with DAPT and escalation of GDMT.  Echocardiogram revealed LVEF of 40-45%, regional wall motion abnormalities and a G3 DD.  She was subsequently diuresed and was down to 4.5 L throughout her hospitalization.  She was considered stable for discharge and was discharged home on 03/04/2022.  She presented again and was admitted to [facility] on 04/11/2022 with shortness of breath and a low pulse ox at home.  Patient stated she had dyspnea on exertion and leg swelling.  She complained of cough with pinkish phlegm.  In the ED she had elevated blood pressure and pulse ox was 88% on room air requiring 2 L of oxygen therapy to maintain oxygen in the 90s.  High-sensitivity troponin was 30 and a BNP  of 985.  CBC and CMP with elevated AST/ALT of 95/80.  Urinalysis with large leukocyte.  EKG without ST or T wave changes.  Chest x-ray showed cardiomegaly with vascular congestion and diffuse bilateral interstitial and groundglass opacities suspicious for pulmonary edema.  She was then admitted to the hospital for further evaluation and treatment.  She continued with IV diureses and symptomatically improved.  She was transition to oral Lasix at discharge.  She was also continued on losartan and bisoprolol it was recommended she continue with daily weights and low-sodium diet and fluid restriction.  She was considered safe for discharge and was discharged on 04/14/2022.  She presented to the [facility] emergency department on 05/07/2022 with complaint of chest pain.  It started early in the night lasted for about an hour and a half and she decided to come to the emergency department for further evaluation.  Her chest wall was tender to palpation but there was no pain at rest.  She denied any other associated symptoms of shortness of breath.  She was noted to be hypertensive with a blood pressure of 175/106.  High-sensitivity troponin were 22 and 24 not consistent with ACS, EKG showed no evidence of ischemia, chest x-ray was clear, given her unrevealing workup she was discharged home with cardiology follow-up.  She was last seen in clinic by Dr. Mariah Milling on 12/14/2022.  At that time she was doing fairly well.  She had had 1 episode of chest pain lasted approximately 15 minutes under the left anterior ribs.  It typically does not have those symptoms on exertion.  She was  having some leg cramping and feels coming from her statin.  She was off telmisartan because it was causing dizziness.  There were no changes made to her medication and no further testing that was ordered.  She called the triage line today with complaint of chest pain that occurred last night while walking up her stairs to her apartment.  She describes her  symptoms as a dull pain that lasts roughly 5 seconds.  She also felt a little dizzy at the time and then again this morning and she stated with some shortness of breath on exertion but denied any swelling or weight increase.  She reported that she was feeling dizzy and reported when she stated she felt like she was going to pass out at that time nursing recommended she contact EMS for emergency department evaluation her blood pressure at home is 129/63 with a heart rate of 58.  The patient then turned around and called scheduling and was added on to the provider scheduled for today.  She arrived in clinic today with continued complaints of occasional shortness of breath and dizziness.  She had had her episode of chest discomfort last evening while walking up the steps to her apartment after supper pain was located in the epigastric area without radiation.  The pain dissipated on its own.  She has a longstanding history of dizziness but states her dizziness did not feel like her typical vertigo.  When going over her medication list she states that she has been off of her losartan but she is unsure for how long she says that had a minimum it has been a few weeks.  She continues on her furosemide 40 mg daily.  Since she had discomfort after dinner with shortness of breath and chest discomfort further discussion revealed that it was likely a meal that was little higher in sodium than normal as well.  ROS: 10 point review of systems has been completed and considered negative with exception of what is listed in the HPI  Studies Reviewed:    EKG: Sinus rhythm with a rate of 61, right bundle branch block, left anterior fascicular block, (bifascicular block), LVH, no acute change from prior studies  Cardiac cath 03/03/22 Severe single vessel coronary artery disease with occlusion of mid LCx with left-to-left and right-to-left collaterals supplying distal OM branches.  Given patient's history of chest pain ~3 weeks  ago following by progressive heart failure symptoms, I suspect her initial coronary event occurred at that time. Mild-moderate, non-obstructive CAD involving the LAD and RCA. Mildly-moderately elevated left heart filling pressures. Moderate pulmonary hypertension. Normal Fick cardiac output/index.   Recommendations: aspirin and clopidogrel for 12 months. No role for PCI to occluded LCx at this time, as event likely occurred ~3 weeks ago.   Echo 03/04/22  1. Left ventricular ejection fraction, by estimation, is 40 to 45%. The  left ventricle has mildly decreased function. The left ventricle  demonstrates regional wall motion abnormalities There is mild left  ventricular hypertrophy. Left ventricular diastolic parameters are  consistent with Grade III diastolic dysfunction (restrictive). There is  moderate hypokinesis of the left ventricular, basal-mid inferolateral wall.   2. Right ventricular systolic function is low normal. The right ventricular size is normal.   3. The mitral valve was not well visualized. Mild mitral valve regurgitation.   4. The aortic valve was not well visualized. Aortic valve regurgitation is mild.   5. The inferior vena cava is dilated in size with <50% respiratory variability, suggesting  right atrial pressure of 15 mmHg.   Risk Assessment/Calculations:              Physical Exam:   VS:  BP 109/72 (BP Location: Left Arm, Patient Position: Sitting, Cuff Size: Normal)   Pulse 61   Ht 5\' 2"  (1.575 m)   Wt 125 lb (56.7 kg)   BMI 22.86 kg/m    Wt Readings from Last 3 Encounters:  02/11/23 125 lb (56.7 kg)  12/14/22 126 lb 2 oz (57.2 kg)  06/12/22 130 lb 2 oz (59 kg)     GEN: Well nourished, well developed in no acute distress NECK: No JVD; No carotid bruits CARDIAC: RRR, no murmurs, rubs, gallops RESPIRATORY:  Clear to auscultation without rales, wheezing or rhonchi  ABDOMEN: Soft, non-tender, non-distended EXTREMITIES:  No edema; No deformity    ASSESSMENT AND PLAN:   Coronary artery disease with stable angina.  She had 1 episode of chest discomfort in the epigastric area after dinner last evening that lasted approximately 5 minutes.  Since that time she has had no further episodes of chest discomfort but she does have associated symptoms of shortness of breath and positional dizziness.  Last catheterization was completed in May 2023 which showed an occluded circumflex with collaterals recommended for medical management.  She is continued on atorvastatin 40 mg 3 times a week, Repatha 140 mg injection every 2 weeks, and ezetimibe 10 mg daily, and clopidogrel 75 mg daily.  The patient had previously taken herself off of aspirin due to bleeding issues.  Chronic HFrEF with a last LVEF 40-45% completed in 02/2022.  She had previously stopped taking her losartan which remains on hold today due to soft blood pressures, she is continued on buspirone 5 mg daily Lasix 40 mg daily, with her continued shortness of breath regimen labs of a BNP, CBC, BMP, D-dimer, and troponin.  She is also being scheduled for follow-up echocardiogram.  Looking back at her labs from her PCP she also had a negative respiratory panel.  Will continue to escalate GDMT as patient tolerates.  She appears to be euvolemic on exam and her weight has been stable since March 2024.  Known history of PAD and carotid disease with continued dizziness today that is not orthostatic.  Goal of LDL is less than 70.  She is continued on all of her statin medications.  Also ordering carotid duplex to reevaluate her carotid disease for any progression as well.  Atrial tachycardia that was noted to be sinus rhythm today.  She is continued on buspirone 5 mg daily.  Has not had any recurrent episodes.  History of right leg DVT with IVC filter.  No reoccurrence noted.  History of bronchiectasis which she is continued on bronchodilators.  She has remained stable.  Had previously been managed by  pulmonary.  Disposition patient to return to clinic to see MD/APP after testing is completed or sooner if needed for the reevaluation of symptoms.        Signed, Brantleigh Mifflin, NP

## 2023-02-11 NOTE — Telephone Encounter (Signed)
Pt called c/o chest pain that occurred last night while walking up her stairs. Pt described symptoms as a dull pain that lasted roughly 5 seconds.  Pt also stated she felt a little dizzy. No vitals to report during that time.  Pt stated since this morning, she's been SOB with exertion but denies swelling or significant weight increase. Pt also reported she's feeling dizzy and reported when she stands, she feels like she's going to pass out  Current vitals BP: 129/63 HR:58  Based on pt's, nurse recommended pt contact EMS Pt verbalized understanding.

## 2023-02-11 NOTE — Telephone Encounter (Signed)
Pt c/o Shortness Of Breath: STAT if SOB developed within the last 24 hours or pt is noticeably SOB on the phone  1. Are you currently SOB (can you hear that pt is SOB on the phone)? no  2. How long have you been experiencing SOB? This morning  3. Are you SOB when sitting or when up moving around? Moving around   4. Are you currently experiencing any other symptoms? Had cp on last night

## 2023-02-11 NOTE — Patient Instructions (Signed)
Medication Instructions:   Your physician recommends that you continue on your current medications as directed. Please refer to the Current Medication list given to you today.  *If you need a refill on your cardiac medications before your next appointment, please call your pharmacy*   Lab Work:  Your physician recommends that you have lab work: TODAY  BNP/BMP/CBC/D-DIMER/Troponin  - Please go to the [facility]. You will check in at the front desk to the right as you walk into the atrium.   If you have labs (blood work) drawn today and your tests are completely normal, you will receive your results only by: MyChart Message (if you have MyChart) OR A paper copy in the mail If you have any lab test that is abnormal or we need to change your treatment, we will call you to review the results.   Testing/Procedures:  Your physician has requested that you have an echocardiogram. Echocardiography is a painless test that uses sound waves to create images of your heart. It provides your doctor with information about the size and shape of your heart and how well your heart's chambers and valves are working. This procedure takes approximately one hour. There are no restrictions for this procedure. Please do NOT wear cologne, perfume, aftershave, or lotions (deodorant is allowed). Please arrive 15 minutes prior to your appointment time.  Your physician has requested that you have a carotid duplex. This test is an ultrasound of the carotid arteries in your neck. It looks at blood flow through these arteries that supply the brain with blood. Allow one hour for this exam. There are no restrictions or special instructions.    Follow-Up: At [facility], you and your health needs are our priority.  As part of our continuing mission to provide you with exceptional heart care, we have created designated Provider Care Teams.  These Care Teams include your primary Cardiologist (physician)  and Advanced Practice Providers (APPs -  Physician Assistants and Nurse Practitioners) who all work together to provide you with the care you need, when you need it.  We recommend signing up for the patient portal called "MyChart".  Sign up information is provided on this After Visit Summary.  MyChart is used to connect with patients for Virtual Visits (Telemedicine).  Patients are able to view lab/test results, encounter notes, upcoming appointments, etc.  Non-urgent messages can be sent to your provider as well.   To learn more about what you can do with MyChart, go to [email or web address].    Your next appointment:    Post echo    Provider:   You may see Julien Nordmann, MD or one of the following Advanced Practice Providers on your designated Care Team:   Nicolasa Ducking, NP Eula Listen, PA-C Cadence Fransico Michael, PA-C Charlsie Quest, NP

## 2023-02-11 NOTE — Progress Notes (Signed)
CBC is stable. Electrolytes and kidney function are stable. BNP normal for age, results as less than usual readings, Troponin is negative so heart attack, D-dimer is slightly elevated. Recommend CTA of chest PE protocol.

## 2023-02-12 ENCOUNTER — Other Ambulatory Visit: Payer: Self-pay | Admitting: Cardiovascular Disease

## 2023-02-12 ENCOUNTER — Telehealth: Payer: Self-pay

## 2023-02-12 ENCOUNTER — Other Ambulatory Visit: Payer: Self-pay | Admitting: *Deleted

## 2023-02-12 ENCOUNTER — Telehealth: Payer: Self-pay | Admitting: *Deleted

## 2023-02-12 DIAGNOSIS — I2699 Other pulmonary embolism without acute cor pulmonale: Secondary | ICD-10-CM

## 2023-02-12 DIAGNOSIS — I5022 Chronic systolic (congestive) heart failure: Secondary | ICD-10-CM

## 2023-02-12 DIAGNOSIS — I25118 Atherosclerotic heart disease of native coronary artery with other forms of angina pectoris: Secondary | ICD-10-CM

## 2023-02-12 DIAGNOSIS — I6529 Occlusion and stenosis of unspecified carotid artery: Secondary | ICD-10-CM

## 2023-02-12 MED ORDER — PREDNISONE 50 MG PO TABS: ORAL_TABLET | ORAL | 0 refills | Status: AC

## 2023-02-12 MED ORDER — DIPHENHYDRAMINE HCL 50 MG PO CAPS: ORAL_CAPSULE | ORAL | 0 refills | Status: DC

## 2023-02-12 NOTE — Telephone Encounter (Signed)
Reviewed results & recommendations with patient. Discussed needed testing and provided her with number to give them a call to schedule. Sending patient instructions through My Chart for premedication for her scan. She verbalized understanding with no further questions at this time.

## 2023-02-12 NOTE — Telephone Encounter (Signed)
-----   Message from Charlsie Quest, NP sent at 02/11/2023  5:55 PM EDT ----- CBC is stable. Electrolytes and kidney function are stable. BNP normal for age, results as less than usual readings, Troponin is negative so heart attack, D-dimer is slightly elevated. Recommend CTA of chest PE protocol.

## 2023-02-12 NOTE — Addendum Note (Signed)
Addended by: Bryna Colander on: 02/12/2023 10:58 AM   Modules accepted: Orders

## 2023-02-12 NOTE — Telephone Encounter (Signed)
Left message for patient to call for clarification on refill request Atorvastatin 40 mg 1 QD.   Pt reported NOT taking Atorvastatin at 02/11/23 office.  Provider notes she is to continue on atorvastatin 40 mg 3 times a week.

## 2023-02-12 NOTE — Telephone Encounter (Signed)
02/12/23@ 2:52--Called patient for clarification for need of refill.  Patient reported at 02/11/23 office visit that she is NOT taking Atorvastatin.  But provider note states to continue atorvastatin 40 mg 3 times a week.

## 2023-02-15 NOTE — Telephone Encounter (Signed)
Patient has not contacted office for clarification.  Could you please advise on refill request.

## 2023-02-17 ENCOUNTER — Ambulatory Visit: Admission: RE | Admit: 2023-02-17 | Payer: Medicare Other | Source: Ambulatory Visit

## 2023-02-18 ENCOUNTER — Other Ambulatory Visit: Payer: Self-pay | Admitting: Cardiovascular Disease

## 2023-02-22 NOTE — Telephone Encounter (Signed)
Per chart patient was weaning off of Atorvastatin and changing to Repatha. Called and spoke with patient for clarification. Patient states that she has switched to Repatha and is no longer taking Atorvastatin.

## 2023-02-25 ENCOUNTER — Ambulatory Visit
Admission: RE | Admit: 2023-02-25 | Discharge: 2023-02-25 | Disposition: A | Discharge status: Home / Self Care | Payer: Medicare Other | Source: Ambulatory Visit | Attending: Cardiology | Admitting: Cardiology

## 2023-02-25 DIAGNOSIS — I2699 Other pulmonary embolism without acute cor pulmonale: Secondary | ICD-10-CM | POA: Diagnosis present

## 2023-02-25 MED ORDER — IOHEXOL 350 MG/ML SOLN
75.0000 mL | Freq: Once | INTRAVENOUS | Status: AC | PRN
  Administered 2023-02-25: 75 mL via INTRAVENOUS

## 2023-03-02 NOTE — Progress Notes (Signed)
No evidence of a blood clot in the lungs. No other incidental findings on scan.Not the reason for sudden onset shortness of breath.

## 2023-03-04 ENCOUNTER — Other Ambulatory Visit: Payer: Self-pay | Admitting: Cardiovascular Disease

## 2023-03-30 NOTE — Progress Notes (Signed)
Cardiology Office Note  Date:  04/01/2023   ID:  Emeraude, Menger [birth year], MRN [id number]  PCP:  Barbette Reichmann, MD   Chief Complaint  Patient presents with   Follow up cardiac testing     Patient c/o shortness of breath after eating. Medications reviewed by the patient verbally.     HPI:  Ms Gavin is a pleasant 87 year old woman with history of  CAD,  occlusion of mid LCx  5/23 lower extremity swelling,Secondary to venous insufficiency  prior right knee surgery,  right DVT, status post IVC filter,  bronchiectasis on inhalers and nebulizers at home  Back surgery Echo 5/23: EF 40 to 45% who presents for follow up of her labile blood pressure and her leg swelling.  Last seen in clinic by myself March 2024 at the [facility] in an appt Friendly people, lots of activities Having a good time Food good  Chest pain gone away Chronic SOB, worse after eating Not on oxygen,  Not use her usual inhaler Continues on Symbicort, followed by pulmonary  Echo 03/31/23 performed yesterday, report reviewed in detail with her today Left ventricular ejection fraction, by estimation, is 55 to 60%. The  left ventricle has normal function. The left ventricle has no regional  wall motion abnormalities. There is mild left ventricular hypertrophy.  Left ventricular diastolic parameters  are consistent with Grade I diastolic dysfunction (impaired relaxation).   2. Right ventricular systolic function is normal. The right ventricular  size is not well visualized.   3. The mitral valve is normal in structure. No evidence of mitral valve  regurgitation.   4. The aortic valve was not well visualized. Aortic valve regurgitation  is not visualized.   5. The inferior vena cava is normal in size with greater than 50%  respiratory variability, suggesting right atrial pressure of 3 mmHg.   Carotid u/s <39%  Lasb reviewed A1C 6.0   on Plavix with no aspirin, bruising on arms No leg swelling, no  PND orthopnea  Old EKGs reviewed, normal sinus rhythm, no changes  Other medical history reviewed Er 04/01/22 for SOB intermittently short of breath with exertion for about the past 2 weeks Acute cystitis with hematuria, treated with ABX  Cardiac cath 03/03/22 Severe single vessel coronary artery disease with occlusion of mid LCx with left-to-left and right-to-left collaterals supplying distal OM branches.  Given patient's history of chest pain ~3 weeks ago following by progressive heart failure symptoms, I suspect her initial coronary event occurred at that time. Mild-moderate, non-obstructive CAD involving the LAD and RCA. Mildly-moderately elevated left heart filling pressures. Moderate pulmonary hypertension. Normal Fick cardiac output/index.   Recommendations: aspirin and clopidogrel for 12 months. No role for PCI to occluded LCx at this time, as event likely occurred ~3 weeks ago.  Echo 03/04/22  1. Left ventricular ejection fraction, by estimation, is 40 to 45%. The  left ventricle has mildly decreased function. The left ventricle  demonstrates regional wall motion abnormalities There is mild left  ventricular hypertrophy. Left ventricular diastolic parameters are  consistent with Grade III diastolic dysfunction (restrictive). There is  moderate hypokinesis of the left ventricular, basal-mid inferolateral wall.   2. Right ventricular systolic function is low normal. The right ventricular size is normal.   3. The mitral valve was not well visualized. Mild mitral valve regurgitation.   4. The aortic valve was not well visualized. Aortic valve regurgitation is mild.   5. The inferior vena cava is dilated in size with <50%  respiratory variability, suggesting right atrial pressure of 15 mmHg.   [facility] ED on 10/15/21 for evaluation of dizziness.  weakness and disorientation, her legs felt very heavy, and she was not sure if she could take the next step.  There was no evidence of  arrhythmia or ischemia on cardiac monitor with the exception of frequent PVCs. Orthostatic VS were unremarkable. Hs troponins were within normal range  Echocardiogram November 24, 2021 Reviewed on today's visit Normal LV function Normal RV function No significant valve disease  Zio monitor reviewed Patient had a min HR of 40 bpm, max HR of 143 bpm, and avg HR of 55 bpm. Predominant underlying rhythm was Sinus Rhythm. 5 Supraventricular Tachycardia runs occurred, the run with  the fastest interval lasting 10 beats with a max rate of 143 bpm (avg 136 bpm); the run with the fastest interval was also the longest.  Supraventricular Tachycardia / Atrial Tachycardia Isolated SVEs  were frequent (12.9%, 16109), SVE Couplets were rare (<1.0%, 1059), and SVE Triplets were rare (<1.0%, 17).  Isolated VEs were rare (<1.0%), and no VE Couplets or VE Triplets were present.  Off telmistartan, stopped , "Causes dizziness"  has carotid disease done through [facility], was told she had moderate disease   PMH:   has a past medical history of Anemia, Atrial tachycardia, Breast cancer (HCC), Bronchiectasis (HCC) (06/2007), CHF (congestive heart failure) (HCC), Chronic Dizziness, Colitis, COPD (chronic obstructive pulmonary disease) (HCC), Depression, Diastolic dysfunction, DVT (deep venous thrombosis) (HCC), Fibromyalgia, Labile Hypertension, Migraine headache with aura, Osteoarthritis, Osteoporosis, PAD (peripheral artery disease) (HCC), Pneumonia, Polio (1952), and Stenosis of carotid artery.  PSH:    Past Surgical History:  Procedure Laterality Date   ABDOMINAL HYSTERECTOMY     BACK SURGERY     BILATERAL TOTAL MASTECTOMY WITH AXILLARY LYMPH NODE DISSECTION     BREAST SURGERY     CARPAL TUNNEL RELEASE     bilateral    COLONOSCOPY WITH PROPOFOL N/A 08/02/2015   Procedure: COLONOSCOPY WITH PROPOFOL;  Surgeon: Scot Jun, MD;  Location: [facility] ENDOSCOPY;  Service: Endoscopy;  Laterality: N/A;   FINGER  TENDON REPAIR     HERNIA REPAIR     REPLACEMENT TOTAL KNEE     right knee    RIGHT/LEFT HEART CATH AND CORONARY ANGIOGRAPHY N/A 03/03/2022   Procedure: RIGHT/LEFT HEART CATH AND CORONARY ANGIOGRAPHY;  Surgeon: Yvonne Kendall, MD;  Location: [facility] INVASIVE CV LAB;  Service: Cardiovascular;  Laterality: N/A;   SQUAMOUS CELL CARCINOMA EXCISION     TONSILLECTOMY     TOTAL KNEE ARTHROPLASTY Left 09/19/2020   Procedure: TOTAL KNEE ARTHROPLASTY;  Surgeon: Kennedy Bucker, MD;  Location: [facility] ORS;  Service: Orthopedics;  Laterality: Left;   UMBILICAL HERNIA REPAIR     VAGINAL HYSTERECTOMY      Current Outpatient Medications on File Prior to Visit  Medication Sig Dispense Refill   acetaminophen (TYLENOL) 500 MG tablet Take 500 mg by mouth at bedtime as needed for moderate pain.     albuterol (VENTOLIN HFA) 108 (90 Base) MCG/ACT inhaler Inhale 2 puffs into the lungs every 6 (six) hours as needed for wheezing or shortness of breath.     ARIPiprazole (ABILIFY) 2 MG tablet Take 2 mg by mouth daily.      bisoprolol (ZEBETA) 5 MG tablet TAKE 1 TABLET BY MOUTH DAILY 90 tablet 0   clopidogrel (PLAVIX) 75 MG tablet TAKE ONE TABLET EACH MORNING WITH BREAKFAST 90 tablet 1   cyclobenzaprine (FLEXERIL) 5 MG tablet Take 5  mg by mouth at bedtime as needed.     diphenhydrAMINE (BENADRYL) 50 MG capsule Take one capsule 1 hour prior to scan. 1 capsule 0   doxazosin (CARDURA) 2 MG tablet Take 2 mg by mouth daily.     Evolocumab (REPATHA SURECLICK) 140 MG/ML SOAJ Inject 140 mg into the skin every 14 (fourteen) days. 2 mL 12   ezetimibe (ZETIA) 10 MG tablet TAKE 1 TABLET BY MOUTH DAILY. 90 tablet 2   furosemide (LASIX) 40 MG tablet TAKE ONE TABLET (40 MG) BY MOUTH EVERY DAY 30 tablet 0   hydrOXYzine (ATARAX/VISTARIL) 25 MG tablet Take 25 mg by mouth as needed for anxiety.     losartan (COZAAR) 25 MG tablet Take 1 tablet (25 mg total) by mouth daily. 90 tablet 1   Multiple Vitamins-Minerals (PRESERVISION AREDS 2 PO)  Take by mouth.     Omega-3 Fatty Acids (FISH OIL OMEGA-3 PO) Take 2 capsules by mouth at bedtime.     polyethylene glycol (MIRALAX / GLYCOLAX) packet Take 17 g by mouth daily as needed for mild constipation or moderate constipation.      potassium chloride (KLOR-CON M) 10 MEQ tablet TAKE 1 TABLET BY MOUTH ONCE DAILY 90 tablet 2   predniSONE (DELTASONE) 50 MG tablet Take one tablet 13 hours, 7 hours, and 1 hour prior to scan. 3 tablet 0   rizatriptan (MAXALT) 5 MG tablet Take 5 mg by mouth as needed for migraine. As needed. Needed 3 times in past 3 months     sodium chloride HYPERTONIC 3 % nebulizer solution Take 5 mLs by nebulization every 4 (four) hours as needed.     SYMBICORT 160-4.5 MCG/ACT inhaler SMARTSIG:2 Inhalation Via Inhaler Twice Daily     venlafaxine XR (EFFEXOR-XR) 150 MG 24 hr capsule Take 150 mg by mouth daily.     No current facility-administered medications on file prior to visit.    Allergies:   Ivp dye [iodinated contrast media], Charentais melon (french melon), Codeine, Cucumber extract, Demeclocycline, Galcanezumab-gnlm, Sulfa antibiotics, Tegaderm ag mesh [silver], Telmisartan, Tetracyclines & related, and Wild lettuce extract (lactuca virosa)   Social History:  The patient  reports that she has never smoked. She has never used smokeless tobacco. She reports current alcohol use of about 4.0 standard drinks of alcohol per week. She reports that she does not use drugs.   Family History:   family history includes Arthritis in her father and mother; Atrial fibrillation in her sister; Bladder Cancer in her father; Pancreatic cancer in her mother.    Review of Systems: Review of Systems  Constitutional: Negative.   HENT: Negative.    Respiratory:  Positive for shortness of breath.   Cardiovascular: Negative.   Gastrointestinal: Negative.   Neurological: Negative.   Psychiatric/Behavioral: Negative.    All other systems reviewed and are negative.   PHYSICAL EXAM: VS:   BP (!) 150/70 (BP Location: Left Arm, Patient Position: Sitting, Cuff Size: Normal)   Pulse 64   Ht 5\' 2"  (1.575 m)   Wt 128 lb 4 oz (58.2 kg)   SpO2 96%   BMI 23.46 kg/m  , BMI Body mass index is 23.46 kg/m.  Constitutional:  oriented to person, place, and time. No distress.  HENT:  Head: Grossly normal Eyes:  no discharge. No scleral icterus.  Neck: No JVD, no carotid bruits  Cardiovascular: Regular rate and rhythm, no murmurs appreciated Pulmonary/Chest: Clear to auscultation bilaterally, no wheezes or rails Abdominal: Soft.  no distension.  no  tenderness.  Musculoskeletal: Normal range of motion Neurological:  normal muscle tone. Coordination normal. No atrophy Skin: Skin warm and dry Psychiatric: normal affect, pleasant  Recent Labs: 04/13/2022: ALT 54 04/14/2022: Magnesium 2.4 02/11/2023: B Natriuretic Peptide 245.8; BUN 18; Creatinine, Ser 0.58; Hemoglobin 13.1; Platelets 223; Potassium 3.9; Sodium 136    Lipid Panel Lab Results  Component Value Date   CHOL 186 03/02/2022   HDL 70 03/02/2022   LDLCALC 109 (H) 03/02/2022   TRIG 37 03/02/2022    Wt Readings from Last 3 Encounters:  04/01/23 128 lb 4 oz (58.2 kg)  02/11/23 125 lb (56.7 kg)  12/14/22 126 lb 2 oz (57.2 kg)     ASSESSMENT AND PLAN:  Coronary artery disease with stable angina Catheterization May 2023, occluded circumflex with collaterals Recommend she continue Zetia and Repatha  continue Plavix Ejection fraction normalized  Chronic shortness of breath  Labile blood pressure Blood pressure is well controlled on today's visit. No changes made to the medications.  History of DVT (deep vein thrombosis) No recurrent episodes, stable  Bronchiectasis without complication (HCC) chronic shortness of breath,  Previously on oxygen, did not bring it with her today  Reports breathing worse after eating dinner especially if she overeats  Follow-up pulmonary, on Symbicort, not using her albuterol inhaler   Recommend regular exercise program for conditioning   Leg swelling taking Lasix 40 mg,  Stable renal function,  No edema on today's visit  PAD/carotid disease Goal LDL less than 70, currently at goal Zetia, new prescription for Repatha  Atrial tachycardia Continue bisoprolol 5 mg daily,  Denies tachycardia Prior Zio monitor, no new symptoms  Statin myalgias Will wean off statin, Lipitor    Total encounter time more than 40 minutes  Greater than 50% was spent in counseling and coordination of care with the patient   No orders of the defined types were placed in this encounter.     Signed, Dossie Arbour, M.D., Ph.D. 04/01/2023  [facility] [place], [place] [phone]

## 2023-03-31 ENCOUNTER — Ambulatory Visit (INDEPENDENT_AMBULATORY_CARE_PROVIDER_SITE_OTHER): Payer: Medicare Other

## 2023-03-31 ENCOUNTER — Ambulatory Visit: Payer: Medicare Other | Attending: Cardiology

## 2023-03-31 DIAGNOSIS — R0609 Other forms of dyspnea: Secondary | ICD-10-CM

## 2023-03-31 DIAGNOSIS — R42 Dizziness and giddiness: Secondary | ICD-10-CM

## 2023-03-31 LAB — ECHOCARDIOGRAM COMPLETE
AR max vel: 2.47 cm2
AV Area VTI: 2.54 cm2
AV Area mean vel: 2.33 cm2
AV Mean grad: 3 mmHg
AV Peak grad: 4.8 mmHg
Ao pk vel: 1.09 m/s
Area-P 1/2: 3.12 cm2

## 2023-03-31 MED ORDER — PERFLUTREN LIPID MICROSPHERE
1.0000 mL | INTRAVENOUS | Status: AC | PRN
Start: 2023-03-31 — End: 2023-03-31
  Administered 2023-03-31: 2 mL via INTRAVENOUS

## 2023-04-01 ENCOUNTER — Ambulatory Visit (INDEPENDENT_AMBULATORY_CARE_PROVIDER_SITE_OTHER): Payer: Medicare Other | Admitting: Cardiovascular Disease

## 2023-04-01 ENCOUNTER — Encounter: Payer: Self-pay | Admitting: Cardiovascular Disease

## 2023-04-01 ENCOUNTER — Ambulatory Visit: Payer: Medicare Other | Admitting: Cardiology

## 2023-04-01 VITALS — BP 130/75 | HR 64 | Ht 62.0 in | Wt 128.2 lb

## 2023-04-01 DIAGNOSIS — I25118 Atherosclerotic heart disease of native coronary artery with other forms of angina pectoris: Secondary | ICD-10-CM | POA: Diagnosis not present

## 2023-04-01 DIAGNOSIS — I6529 Occlusion and stenosis of unspecified carotid artery: Secondary | ICD-10-CM | POA: Diagnosis not present

## 2023-04-01 DIAGNOSIS — I2699 Other pulmonary embolism without acute cor pulmonale: Secondary | ICD-10-CM

## 2023-04-01 DIAGNOSIS — I5022 Chronic systolic (congestive) heart failure: Secondary | ICD-10-CM | POA: Diagnosis not present

## 2023-04-01 DIAGNOSIS — R531 Weakness: Secondary | ICD-10-CM

## 2023-04-01 DIAGNOSIS — R42 Dizziness and giddiness: Secondary | ICD-10-CM

## 2023-04-01 DIAGNOSIS — I739 Peripheral vascular disease, unspecified: Secondary | ICD-10-CM

## 2023-04-01 DIAGNOSIS — I4719 Other supraventricular tachycardia: Secondary | ICD-10-CM

## 2023-04-01 DIAGNOSIS — E782 Mixed hyperlipidemia: Secondary | ICD-10-CM

## 2023-04-01 DIAGNOSIS — R0609 Other forms of dyspnea: Secondary | ICD-10-CM

## 2023-04-01 DIAGNOSIS — I10 Essential (primary) hypertension: Secondary | ICD-10-CM

## 2023-04-01 NOTE — Patient Instructions (Signed)
Medication Instructions:  No changes  If you need a refill on your cardiac medications before your next appointment, please call your pharmacy.   Lab work: No new labs needed  Testing/Procedures: No new testing needed  Follow-Up: At [facility], you and your health needs are our priority.  As part of our continuing mission to provide you with exceptional heart care, we have created designated Provider Care Teams.  These Care Teams include your primary Cardiologist (physician) and Advanced Practice Providers (APPs -  Physician Assistants and Nurse Practitioners) who all work together to provide you with the care you need, when you need it.  You will need a follow up appointment in 12 months  Providers on your designated Care Team:   Christopher Berge, NP Ryan Dunn, PA-C Cadence Furth, PA-C  COVID-19 Vaccine Information can be found at: [email or web address] For questions related to vaccine distribution or appointments, please email [email or web address] or call [phone].   

## 2023-04-05 NOTE — Progress Notes (Signed)
Right carotid with 1-39% stenosis, left carotid artery with no significant stenosis noted. Reassuring study.

## 2023-04-05 NOTE — Progress Notes (Signed)
Heart squeeze is 55-60%, there is impaired relaxation to the muscle likely related to age or blood pressure.  And there is no changes noted to any of the heart valves.  Continue current medication regimen unchanged and keep follow-up appointments.

## 2023-04-22 ENCOUNTER — Telehealth: Payer: Self-pay | Admitting: Cardiovascular Disease

## 2023-04-22 NOTE — Telephone Encounter (Signed)
STAT if HR is under 50 or over 120 (normal HR is 60-100 beats per minute)  What is your heart rate? 45  Do you have a log of your heart rate readings (document readings)?   No  Do you have any other symptoms?   Extremely dizzy   Patient states she is having low HR reading and is concerned she may need a medication change.

## 2023-04-22 NOTE — Telephone Encounter (Signed)
Called and spoke with patient. Patient reports that she felt dizzy and like she was going to pass out. Patient states that she lives in a facility and the staff checked her vital signs. Patient reports that her blood pressure was 128/80's and heart rate of 45. Patient states that she does not feel like she is going to pass out but that she still feels dizzy. Patient informed that there are no appointments available here. Patient advised to go the emergency department. Patient states that she is going to call her PCP.

## 2023-05-04 ENCOUNTER — Other Ambulatory Visit: Payer: Self-pay | Admitting: Cardiovascular Disease

## 2023-06-28 ENCOUNTER — Other Ambulatory Visit: Payer: Self-pay | Admitting: Cardiovascular Disease

## 2023-06-28 NOTE — Telephone Encounter (Signed)
Pt verified that she is taking Doxazosin 2 mg tablet once daily. Pt was confused when she requested the medication. Please advise if ok to continue with Refill for Doxazosin 2 mg tablet daily.

## 2023-08-04 ENCOUNTER — Telehealth: Payer: Self-pay | Admitting: Cardiovascular Disease

## 2023-08-04 NOTE — Telephone Encounter (Signed)
Pt dropped off holter monitor results. Placed in [employer] nurse box.

## 2023-08-04 NOTE — Telephone Encounter (Signed)
Forms placed in Dr. Windell Hummingbird office for review.

## 2023-08-12 NOTE — Telephone Encounter (Signed)
Called patient and notified her of the following from Dr. Mariah Milling.  2-day monitor reviewed  Shows normal sinus rhythm, average heart rate 73 bpm  Rare PVC, rare PAC  Rare episodes of tachycardia lasting several seconds at a time  No atrial fibrillation noted or other significant arrhythmia  No significant change in medications needed  Thx  Tim Gollan   Patient verbalizes understanding of results. Patient requesting to be seen. Patient with complaint of intermittent dizziness times one year. Patient scheduled on 08/17/23.

## 2023-08-12 NOTE — Telephone Encounter (Signed)
  Pt is calling to follow up heart monitor result and Dr. Windell Hummingbird recommendation. She said, she is getting upset that she haven't receive any update yet. She would like to get a callback today

## 2023-08-16 NOTE — Progress Notes (Deleted)
Cardiology Office Note  Date:  08/16/2023   ID:  Tiffine, Petroff [birth year], MRN [id number]  PCP:  Barbette Reichmann, MD   No chief complaint on file.   HPI:  Ms Behmer is a pleasant 87 year old woman with history of  CAD,  occlusion of mid LCx  5/23 lower extremity swelling,Secondary to venous insufficiency  prior right knee surgery,  right DVT, status post IVC filter,  bronchiectasis on inhalers and nebulizers at home  Back surgery Echo 5/23: EF 40 to 45% who presents for follow up of her labile blood pressure and her leg swelling.  Last seen in clinic by myself June 2024 Recent 2-day monitor through primary care Monitor requested and reviewed Shows normal sinus rhythm, average heart rate 73 bpm  Rare PVC, rare PAC  Rare episodes of tachycardia lasting several seconds at a time  No atrial fibrillation noted or other significant arrhythmia    at the [facility] in an appt Friendly people, lots of activities Having a good time Food good  Chest pain gone away Chronic SOB, worse after eating Not on oxygen,  Not use her usual inhaler Continues on Symbicort, followed by pulmonary  Echo 03/31/23 performed yesterday, report reviewed in detail with her today Left ventricular ejection fraction, by estimation, is 55 to 60%. The  left ventricle has normal function. The left ventricle has no regional  wall motion abnormalities. There is mild left ventricular hypertrophy.  Left ventricular diastolic parameters  are consistent with Grade I diastolic dysfunction (impaired relaxation).   2. Right ventricular systolic function is normal. The right ventricular  size is not well visualized.   3. The mitral valve is normal in structure. No evidence of mitral valve  regurgitation.   4. The aortic valve was not well visualized. Aortic valve regurgitation  is not visualized.   5. The inferior vena cava is normal in size with greater than 50%  respiratory variability, suggesting right  atrial pressure of 3 mmHg.   Carotid u/s <39%  Lasb reviewed A1C 6.0   on Plavix with no aspirin, bruising on arms No leg swelling, no PND orthopnea  Old EKGs reviewed, normal sinus rhythm, no changes  Other medical history reviewed Er 04/01/22 for SOB intermittently short of breath with exertion for about the past 2 weeks Acute cystitis with hematuria, treated with ABX  Cardiac cath 03/03/22 Severe single vessel coronary artery disease with occlusion of mid LCx with left-to-left and right-to-left collaterals supplying distal OM branches.  Given patient's history of chest pain ~3 weeks ago following by progressive heart failure symptoms, I suspect her initial coronary event occurred at that time. Mild-moderate, non-obstructive CAD involving the LAD and RCA. Mildly-moderately elevated left heart filling pressures. Moderate pulmonary hypertension. Normal Fick cardiac output/index.   Recommendations: aspirin and clopidogrel for 12 months. No role for PCI to occluded LCx at this time, as event likely occurred ~3 weeks ago.  Echo 03/04/22  1. Left ventricular ejection fraction, by estimation, is 40 to 45%. The  left ventricle has mildly decreased function. The left ventricle  demonstrates regional wall motion abnormalities There is mild left  ventricular hypertrophy. Left ventricular diastolic parameters are  consistent with Grade III diastolic dysfunction (restrictive). There is  moderate hypokinesis of the left ventricular, basal-mid inferolateral wall.   2. Right ventricular systolic function is low normal. The right ventricular size is normal.   3. The mitral valve was not well visualized. Mild mitral valve regurgitation.   4. The aortic valve was  not well visualized. Aortic valve regurgitation is mild.   5. The inferior vena cava is dilated in size with <50% respiratory variability, suggesting right atrial pressure of 15 mmHg.   [facility] ED on 10/15/21 for evaluation of dizziness.   weakness and disorientation, her legs felt very heavy, and she was not sure if she could take the next step.  There was no evidence of arrhythmia or ischemia on cardiac monitor with the exception of frequent PVCs. Orthostatic VS were unremarkable. Hs troponins were within normal range  Echocardiogram November 24, 2021 Reviewed on today's visit Normal LV function Normal RV function No significant valve disease  Zio monitor reviewed Patient had a min HR of 40 bpm, max HR of 143 bpm, and avg HR of 55 bpm. Predominant underlying rhythm was Sinus Rhythm. 5 Supraventricular Tachycardia runs occurred, the run with  the fastest interval lasting 10 beats with a max rate of 143 bpm (avg 136 bpm); the run with the fastest interval was also the longest.  Supraventricular Tachycardia / Atrial Tachycardia Isolated SVEs  were frequent (12.9%, 40981), SVE Couplets were rare (<1.0%, 1059), and SVE Triplets were rare (<1.0%, 17).  Isolated VEs were rare (<1.0%), and no VE Couplets or VE Triplets were present.  Off telmistartan, stopped , "Causes dizziness"  has carotid disease done through [facility], was told she had moderate disease   PMH:   has a past medical history of Anemia, Atrial tachycardia, Breast cancer (HCC), Bronchiectasis (HCC) (06/2007), CHF (congestive heart failure) (HCC), Chronic Dizziness, Colitis, COPD (chronic obstructive pulmonary disease) (HCC), Depression, Diastolic dysfunction, DVT (deep venous thrombosis) (HCC), Fibromyalgia, Labile Hypertension, Migraine headache with aura, Osteoarthritis, Osteoporosis, PAD (peripheral artery disease) (HCC), Pneumonia, Polio (1952), and Stenosis of carotid artery.  PSH:    Past Surgical History:  Procedure Laterality Date   ABDOMINAL HYSTERECTOMY     BACK SURGERY     BILATERAL TOTAL MASTECTOMY WITH AXILLARY LYMPH NODE DISSECTION     BREAST SURGERY     CARPAL TUNNEL RELEASE     bilateral    COLONOSCOPY WITH PROPOFOL N/A 08/02/2015   Procedure:  COLONOSCOPY WITH PROPOFOL;  Surgeon: Scot Jun, MD;  [facility] ENDOSCOPY;  Service: Endoscopy;  Laterality: N/A;   FINGER TENDON REPAIR     HERNIA REPAIR     REPLACEMENT TOTAL KNEE     right knee    RIGHT/LEFT HEART CATH AND CORONARY ANGIOGRAPHY N/A 03/03/2022   Procedure: RIGHT/LEFT HEART CATH AND CORONARY ANGIOGRAPHY;  Surgeon: Yvonne Kendall, MD;  Location: [facility] INVASIVE CV LAB;  Service: Cardiovascular;  Laterality: N/A;   SQUAMOUS CELL CARCINOMA EXCISION     TONSILLECTOMY     TOTAL KNEE ARTHROPLASTY Left 09/19/2020   Procedure: TOTAL KNEE ARTHROPLASTY;  Surgeon: Kennedy Bucker, MD;  Location: [facility] ORS;  Service: Orthopedics;  Laterality: Left;   UMBILICAL HERNIA REPAIR     VAGINAL HYSTERECTOMY      Current Outpatient Medications on File Prior to Visit  Medication Sig Dispense Refill   acetaminophen (TYLENOL) 500 MG tablet Take 500 mg by mouth at bedtime as needed for moderate pain.     albuterol (VENTOLIN HFA) 108 (90 Base) MCG/ACT inhaler Inhale 2 puffs into the lungs every 6 (six) hours as needed for wheezing or shortness of breath.     ARIPiprazole (ABILIFY) 2 MG tablet Take 2 mg by mouth daily.      bisoprolol (ZEBETA) 5 MG tablet TAKE 1 TABLET BY MOUTH DAILY 90 tablet 0   clopidogrel (PLAVIX) 75  MG tablet TAKE ONE TABLET EACH MORNING WITH BREAKFAST 90 tablet 3   cyclobenzaprine (FLEXERIL) 5 MG tablet Take 5 mg by mouth at bedtime as needed.     diphenhydrAMINE (BENADRYL) 50 MG capsule Take one capsule 1 hour prior to scan. 1 capsule 0   doxazosin (CARDURA) 2 MG tablet Take 1 tablet (2 mg total) by mouth daily. 90 tablet 2   Evolocumab (REPATHA SURECLICK) 140 MG/ML SOAJ Inject 140 mg into the skin every 14 (fourteen) days. 2 mL 12   ezetimibe (ZETIA) 10 MG tablet TAKE 1 TABLET BY MOUTH DAILY. 90 tablet 2   furosemide (LASIX) 40 MG tablet TAKE ONE TABLET (40 MG) BY MOUTH EVERY DAY 30 tablet 0   hydrOXYzine (ATARAX/VISTARIL) 25 MG tablet Take 25 mg by mouth as needed  for anxiety.     losartan (COZAAR) 25 MG tablet Take 1 tablet (25 mg total) by mouth daily. 90 tablet 1   Multiple Vitamins-Minerals (PRESERVISION AREDS 2 PO) Take by mouth.     Omega-3 Fatty Acids (FISH OIL OMEGA-3 PO) Take 2 capsules by mouth at bedtime.     polyethylene glycol (MIRALAX / GLYCOLAX) packet Take 17 g by mouth daily as needed for mild constipation or moderate constipation.      potassium chloride (KLOR-CON M) 10 MEQ tablet TAKE 1 TABLET BY MOUTH ONCE DAILY 90 tablet 2   predniSONE (DELTASONE) 50 MG tablet Take one tablet 13 hours, 7 hours, and 1 hour prior to scan. 3 tablet 0   rizatriptan (MAXALT) 5 MG tablet Take 5 mg by mouth as needed for migraine. As needed. Needed 3 times in past 3 months     sodium chloride HYPERTONIC 3 % nebulizer solution Take 5 mLs by nebulization every 4 (four) hours as needed.     SYMBICORT 160-4.5 MCG/ACT inhaler SMARTSIG:2 Inhalation Via Inhaler Twice Daily     venlafaxine XR (EFFEXOR-XR) 150 MG 24 hr capsule Take 150 mg by mouth daily.     No current facility-administered medications on file prior to visit.    Allergies:   Ivp dye [iodinated contrast media], Charentais melon (french melon), Codeine, Cucumber extract, Demeclocycline, Galcanezumab-gnlm, Sulfa antibiotics, Tegaderm ag mesh [silver], Telmisartan, Tetracyclines & related, and Wild lettuce extract (lactuca virosa)   Social History:  The patient  reports that she has never smoked. She has never used smokeless tobacco. She reports current alcohol use of about 4.0 standard drinks of alcohol per week. She reports that she does not use drugs.   Family History:   family history includes Arthritis in her father and mother; Atrial fibrillation in her sister; Bladder Cancer in her father; Pancreatic cancer in her mother.    Review of Systems: Review of Systems  Constitutional: Negative.   HENT: Negative.    Respiratory:  Positive for shortness of breath.   Cardiovascular: Negative.    Gastrointestinal: Negative.   Neurological: Negative.   Psychiatric/Behavioral: Negative.    All other systems reviewed and are negative.   PHYSICAL EXAM: VS:  There were no vitals taken for this visit. , BMI There is no height or weight on file to calculate BMI.  Constitutional:  oriented to person, place, and time. No distress.  HENT:  Head: Grossly normal Eyes:  no discharge. No scleral icterus.  Neck: No JVD, no carotid bruits  Cardiovascular: Regular rate and rhythm, no murmurs appreciated Pulmonary/Chest: Clear to auscultation bilaterally, no wheezes or rails Abdominal: Soft.  no distension.  no tenderness.  Musculoskeletal: Normal range  of motion Neurological:  normal muscle tone. Coordination normal. No atrophy Skin: Skin warm and dry Psychiatric: normal affect, pleasant  Recent Labs: 02/11/2023: B Natriuretic Peptide 245.8; BUN 18; Creatinine, Ser 0.58; Hemoglobin 13.1; Platelets 223; Potassium 3.9; Sodium 136    Lipid Panel Lab Results  Component Value Date   CHOL 186 03/02/2022   HDL 70 03/02/2022   LDLCALC 109 (H) 03/02/2022   TRIG 37 03/02/2022    Wt Readings from Last 3 Encounters:  04/01/23 128 lb 4 oz (58.2 kg)  02/11/23 125 lb (56.7 kg)  12/14/22 126 lb 2 oz (57.2 kg)     ASSESSMENT AND PLAN:  Coronary artery disease with stable angina Catheterization May 2023, occluded circumflex with collaterals Recommend she continue Zetia and Repatha  continue Plavix Ejection fraction normalized  Chronic shortness of breath  Labile blood pressure Blood pressure is well controlled on today's visit. No changes made to the medications.  History of DVT (deep vein thrombosis) No recurrent episodes, stable  Bronchiectasis without complication (HCC) chronic shortness of breath,  Previously on oxygen, did not bring it with her today  Reports breathing worse after eating dinner especially if she overeats  Follow-up pulmonary, on Symbicort, not using her  albuterol inhaler  Recommend regular exercise program for conditioning   Leg swelling taking Lasix 40 mg,  Stable renal function,  No edema on today's visit  PAD/carotid disease Goal LDL less than 70, currently at goal Zetia, new prescription for Repatha  Atrial tachycardia Continue bisoprolol 5 mg daily,  Denies tachycardia Prior Zio monitor, no new symptoms  Statin myalgias Will wean off statin, Lipitor    Total encounter time more than 40 minutes  Greater than 50% was spent in counseling and coordination of care with the patient   No orders of the defined types were placed in this encounter.     Signed, Dossie Arbour, M.D., Ph.D. 08/16/2023  [facility] [place], [place] [phone]

## 2023-08-17 ENCOUNTER — Ambulatory Visit: Payer: Medicare Other

## 2023-08-17 ENCOUNTER — Emergency Department: Payer: Medicare Other

## 2023-08-17 ENCOUNTER — Other Ambulatory Visit: Payer: Self-pay

## 2023-08-17 ENCOUNTER — Observation Stay
Admission: EM | Admit: 2023-08-17 | Discharge: 2023-08-18 | Disposition: A | Discharge status: Home / Self Care | Payer: Medicare Other | Attending: Internal Medicine | Admitting: Internal Medicine

## 2023-08-17 ENCOUNTER — Ambulatory Visit: Payer: Medicare Other | Admitting: Cardiovascular Disease

## 2023-08-17 DIAGNOSIS — E538 Deficiency of other specified B group vitamins: Secondary | ICD-10-CM | POA: Diagnosis not present

## 2023-08-17 DIAGNOSIS — Z86718 Personal history of other venous thrombosis and embolism: Secondary | ICD-10-CM | POA: Insufficient documentation

## 2023-08-17 DIAGNOSIS — R4182 Altered mental status, unspecified: Secondary | ICD-10-CM | POA: Diagnosis not present

## 2023-08-17 DIAGNOSIS — R531 Weakness: Secondary | ICD-10-CM

## 2023-08-17 DIAGNOSIS — I6529 Occlusion and stenosis of unspecified carotid artery: Secondary | ICD-10-CM

## 2023-08-17 DIAGNOSIS — R4701 Aphasia: Principal | ICD-10-CM | POA: Diagnosis present

## 2023-08-17 DIAGNOSIS — I25118 Atherosclerotic heart disease of native coronary artery with other forms of angina pectoris: Secondary | ICD-10-CM

## 2023-08-17 DIAGNOSIS — I739 Peripheral vascular disease, unspecified: Secondary | ICD-10-CM | POA: Insufficient documentation

## 2023-08-17 DIAGNOSIS — M810 Age-related osteoporosis without current pathological fracture: Secondary | ICD-10-CM | POA: Insufficient documentation

## 2023-08-17 DIAGNOSIS — Z96653 Presence of artificial knee joint, bilateral: Secondary | ICD-10-CM | POA: Diagnosis not present

## 2023-08-17 DIAGNOSIS — I10 Essential (primary) hypertension: Secondary | ICD-10-CM

## 2023-08-17 DIAGNOSIS — I252 Old myocardial infarction: Secondary | ICD-10-CM | POA: Insufficient documentation

## 2023-08-17 DIAGNOSIS — R0609 Other forms of dyspnea: Secondary | ICD-10-CM

## 2023-08-17 DIAGNOSIS — I5032 Chronic diastolic (congestive) heart failure: Secondary | ICD-10-CM | POA: Insufficient documentation

## 2023-08-17 DIAGNOSIS — I2510 Atherosclerotic heart disease of native coronary artery without angina pectoris: Secondary | ICD-10-CM | POA: Insufficient documentation

## 2023-08-17 DIAGNOSIS — M542 Cervicalgia: Secondary | ICD-10-CM | POA: Insufficient documentation

## 2023-08-17 DIAGNOSIS — J449 Chronic obstructive pulmonary disease, unspecified: Secondary | ICD-10-CM | POA: Diagnosis not present

## 2023-08-17 DIAGNOSIS — Z853 Personal history of malignant neoplasm of breast: Secondary | ICD-10-CM | POA: Diagnosis not present

## 2023-08-17 DIAGNOSIS — I4510 Unspecified right bundle-branch block: Secondary | ICD-10-CM | POA: Insufficient documentation

## 2023-08-17 DIAGNOSIS — Z79899 Other long term (current) drug therapy: Secondary | ICD-10-CM | POA: Insufficient documentation

## 2023-08-17 DIAGNOSIS — Z8616 Personal history of COVID-19: Secondary | ICD-10-CM | POA: Diagnosis not present

## 2023-08-17 DIAGNOSIS — Z85828 Personal history of other malignant neoplasm of skin: Secondary | ICD-10-CM | POA: Insufficient documentation

## 2023-08-17 DIAGNOSIS — R569 Unspecified convulsions: Secondary | ICD-10-CM

## 2023-08-17 DIAGNOSIS — Z8673 Personal history of transient ischemic attack (TIA), and cerebral infarction without residual deficits: Secondary | ICD-10-CM | POA: Diagnosis not present

## 2023-08-17 DIAGNOSIS — R4789 Other speech disturbances: Principal | ICD-10-CM

## 2023-08-17 DIAGNOSIS — I110 Hypertensive heart disease with heart failure: Secondary | ICD-10-CM | POA: Insufficient documentation

## 2023-08-17 DIAGNOSIS — Z7902 Long term (current) use of antithrombotics/antiplatelets: Secondary | ICD-10-CM | POA: Diagnosis not present

## 2023-08-17 DIAGNOSIS — I6782 Cerebral ischemia: Secondary | ICD-10-CM | POA: Insufficient documentation

## 2023-08-17 DIAGNOSIS — I4719 Other supraventricular tachycardia: Secondary | ICD-10-CM

## 2023-08-17 DIAGNOSIS — I5022 Chronic systolic (congestive) heart failure: Secondary | ICD-10-CM

## 2023-08-17 DIAGNOSIS — I2699 Other pulmonary embolism without acute cor pulmonale: Secondary | ICD-10-CM

## 2023-08-17 DIAGNOSIS — E782 Mixed hyperlipidemia: Secondary | ICD-10-CM

## 2023-08-17 DIAGNOSIS — R42 Dizziness and giddiness: Secondary | ICD-10-CM

## 2023-08-17 LAB — URINALYSIS, ROUTINE W REFLEX MICROSCOPIC
Bacteria, UA: NONE SEEN
Bilirubin Urine: NEGATIVE
Glucose, UA: NEGATIVE mg/dL
Hgb urine dipstick: NEGATIVE
Ketones, ur: NEGATIVE mg/dL
Nitrite: NEGATIVE
Protein, ur: NEGATIVE mg/dL
Specific Gravity, Urine: 1.004 — ABNORMAL LOW (ref 1.005–1.030)
Squamous Epithelial / HPF: 0 /[HPF] (ref 0–5)
pH: 8 (ref 5.0–8.0)

## 2023-08-17 LAB — CBC
HCT: 41.7 % (ref 36.0–46.0)
Hemoglobin: 13.5 g/dL (ref 12.0–15.0)
MCH: 29.3 pg (ref 26.0–34.0)
MCHC: 32.4 g/dL (ref 30.0–36.0)
MCV: 90.5 fL (ref 80.0–100.0)
Platelets: 188 10*3/uL (ref 150–400)
RBC: 4.61 MIL/uL (ref 3.87–5.11)
RDW: 14.2 % (ref 11.5–15.5)
WBC: 6.1 10*3/uL (ref 4.0–10.5)
nRBC: 0 % (ref 0.0–0.2)

## 2023-08-17 LAB — URINE DRUG SCREEN, QUALITATIVE (ARMC ONLY)
Amphetamines, Ur Screen: NOT DETECTED
Barbiturates, Ur Screen: NOT DETECTED
Benzodiazepine, Ur Scrn: NOT DETECTED
Cannabinoid 50 Ng, Ur ~~LOC~~: NOT DETECTED
Cocaine Metabolite,Ur ~~LOC~~: NOT DETECTED
MDMA (Ecstasy)Ur Screen: NOT DETECTED
Methadone Scn, Ur: NOT DETECTED
Opiate, Ur Screen: NOT DETECTED
Phencyclidine (PCP) Ur S: NOT DETECTED
Tricyclic, Ur Screen: NOT DETECTED

## 2023-08-17 LAB — DIFFERENTIAL
Abs Immature Granulocytes: 0.01 10*3/uL (ref 0.00–0.07)
Basophils Absolute: 0.1 10*3/uL (ref 0.0–0.1)
Basophils Relative: 1 %
Eosinophils Absolute: 0.3 10*3/uL (ref 0.0–0.5)
Eosinophils Relative: 5 %
Immature Granulocytes: 0 %
Lymphocytes Relative: 25 %
Lymphs Abs: 1.5 10*3/uL (ref 0.7–4.0)
Monocytes Absolute: 0.6 10*3/uL (ref 0.1–1.0)
Monocytes Relative: 9 %
Neutro Abs: 3.7 10*3/uL (ref 1.7–7.7)
Neutrophils Relative %: 60 %

## 2023-08-17 LAB — COMPREHENSIVE METABOLIC PANEL
ALT: 25 U/L (ref 0–44)
AST: 50 U/L — ABNORMAL HIGH (ref 15–41)
Albumin: 3.9 g/dL (ref 3.5–5.0)
Alkaline Phosphatase: 77 U/L (ref 38–126)
Anion gap: 9 (ref 5–15)
BUN: 20 mg/dL (ref 8–23)
CO2: 30 mmol/L (ref 22–32)
Calcium: 8.7 mg/dL — ABNORMAL LOW (ref 8.9–10.3)
Chloride: 98 mmol/L (ref 98–111)
Creatinine, Ser: 0.69 mg/dL (ref 0.44–1.00)
GFR, Estimated: >60 mL/min (ref >60)
Glucose, Bld: 91 mg/dL (ref 70–99)
Potassium: 3.5 mmol/L (ref 3.5–5.1)
Sodium: 137 mmol/L (ref 135–145)
Total Bilirubin: 0.8 mg/dL (ref <1.2)
Total Protein: 6 g/dL — ABNORMAL LOW (ref 6.5–8.1)

## 2023-08-17 LAB — PROTIME-INR
INR: 0.9 (ref 0.8–1.2)
Prothrombin Time: 12.4 s (ref 11.4–15.2)

## 2023-08-17 LAB — APTT: aPTT: 26 s (ref 24–36)

## 2023-08-17 LAB — SEDIMENTATION RATE: Sed Rate: 37 mm/h — ABNORMAL HIGH (ref 0–30)

## 2023-08-17 LAB — CBG MONITORING, ED: Glucose-Capillary: 84 mg/dL (ref 70–99)

## 2023-08-17 LAB — ETHANOL: Alcohol, Ethyl (B): <10 mg/dL (ref <10)

## 2023-08-17 MED ORDER — ASPIRIN 81 MG PO TBEC
81.0000 mg | DELAYED_RELEASE_TABLET | Freq: Every day | ORAL | Status: DC
  Administered 2023-08-17 – 2023-08-18 (×2): 81 mg via ORAL
  Filled 2023-08-17 (×2): qty 1

## 2023-08-17 MED ORDER — SENNOSIDES-DOCUSATE SODIUM 8.6-50 MG PO TABS: 1.0000 | ORAL_TABLET | Freq: Every evening | ORAL | Status: DC | PRN

## 2023-08-17 MED ORDER — CLOPIDOGREL BISULFATE 75 MG PO TABS
75.0000 mg | ORAL_TABLET | Freq: Every day | ORAL | Status: DC
  Administered 2023-08-18: 75 mg via ORAL
  Filled 2023-08-17: qty 1

## 2023-08-17 MED ORDER — FUROSEMIDE 40 MG PO TABS
40.0000 mg | ORAL_TABLET | Freq: Every day | ORAL | Status: DC
  Administered 2023-08-18: 40 mg via ORAL
  Filled 2023-08-17: qty 1

## 2023-08-17 MED ORDER — ACETAMINOPHEN 650 MG RE SUPP
650.0000 mg | RECTAL | Status: DC | PRN
Start: 2023-08-17 — End: 2023-08-18

## 2023-08-17 MED ORDER — ENOXAPARIN SODIUM 40 MG/0.4ML IJ SOSY
40.0000 mg | PREFILLED_SYRINGE | INTRAMUSCULAR | Status: DC
  Administered 2023-08-17: 40 mg via SUBCUTANEOUS
  Filled 2023-08-17: qty 0.4

## 2023-08-17 MED ORDER — STROKE: EARLY STAGES OF RECOVERY BOOK
Freq: Once | Status: DC
Start: 2023-08-18 — End: 2023-08-18

## 2023-08-17 MED ORDER — SUMATRIPTAN SUCCINATE 50 MG PO TABS: 50.0000 mg | ORAL_TABLET | ORAL | Status: DC | PRN

## 2023-08-17 MED ORDER — GADOBUTROL 1 MMOL/ML IV SOLN
6.0000 mL | Freq: Once | INTRAVENOUS | Status: AC | PRN
  Administered 2023-08-17: 6 mL via INTRAVENOUS

## 2023-08-17 MED ORDER — ACETAMINOPHEN 325 MG PO TABS
650.0000 mg | ORAL_TABLET | ORAL | Status: DC | PRN
  Administered 2023-08-17: 650 mg via ORAL
  Filled 2023-08-17: qty 2

## 2023-08-17 MED ORDER — CYANOCOBALAMIN 1000 MCG/ML IJ SOLN
1000.0000 ug | Freq: Every day | INTRAMUSCULAR | Status: DC
  Administered 2023-08-17 – 2023-08-18 (×2): 1000 ug via SUBCUTANEOUS
  Filled 2023-08-17 (×2): qty 1

## 2023-08-17 MED ORDER — ONDANSETRON HCL 4 MG/2ML IJ SOLN: 4.0000 mg | Freq: Four times a day (QID) | INTRAMUSCULAR | Status: DC | PRN

## 2023-08-17 MED ORDER — ACETAMINOPHEN 160 MG/5ML PO SOLN
650.0000 mg | ORAL | Status: DC | PRN
Start: 2023-08-17 — End: 2023-08-18

## 2023-08-17 NOTE — ED Notes (Signed)
Back in room with pt after MRI.

## 2023-08-17 NOTE — ED Triage Notes (Signed)
Pt from [facility] via [facility]. From independent living, was reading when she had difficulty getting words out. Started 10am. Pt normally A&Ox4 but is having difficulty writing name for EMS. Speech is currently normally but pt cannot say where she is or date. Hx of MI and a fib.  EMS vitals: BP 164/100 ->189/98 HR 64 98% RA 122 CBG Temp 98.4

## 2023-08-17 NOTE — ED Notes (Signed)
Pt eating lunch. Pt doing much better and able to name objects.

## 2023-08-17 NOTE — ED Notes (Signed)
[facility] called for [facility] at 11:33

## 2023-08-17 NOTE — H&P (Addendum)
History and Physical:    Lindsey [name]   ZOX:[id number] DOB: 1936-08-07 DOA: 08/17/2023  Referring MD/provider: Claudell Kyle, MD PCP: Barbette Reichmann, MD   Patient coming from: Home/independent living facility  Chief Complaint: "I had trouble speaking"  History of Present Illness:   Lindsey [name] is a 87 y.o. female with medical history significant for chronic diastolic CHF,  CAD on Plavix, COPD, depression, chronic dizziness, bronchiectasis, PAD, migraine headaches with aura, labile hypertension, fibromyalgia, history of DVT after right knee surgery, s/pIVC filter, colitis, atrial tachycardia, anemia, carotid artery stenosis, osteoporosis, osteoarthritis.  Recent 2 day event monitor applied on late October 2024 did not show any evidence of atrial fibrillation or atrial flutter.  She has a history of migraine headache but she says she has not had a migraine in long time.  She presented to the hospital because of difficulty speaking and confusion.  Apparently symptoms started around 10 AM, though patient told me it started around 11 AM.  Per ED report and chart review symptoms had resolved by the time EMS arrived.  However, symptoms recurred en route to the emergency department.  She did not have any other symptoms.  Of note, she complained of a mild headache but she said she developed headache after she arrived in the ED, and she attributed this to poor positioning of her head/neck on the pillow in the ED.  No unilateral weakness or numbness in the extremities, no changes in vision, no headache (prior to admission), no vomiting and no difficulty walking.  She said that she has had unsteady gait for about 2 years since she had COVID-19 infection.  She has a walker that she uses every now and then.  She has not had any recent falls.  No fever, chills, urinary symptoms, chest pain, shortness of breath, cough, leg swelling, extremity pain, vomiting, abdominal pain or diarrhea.  ED Course:   The patient had workup for acute stroke including CT head and MRI brain.  ROS:   ROS all other systems reviewed were negative.  Past Medical History:   Past Medical History:  Diagnosis Date   Anemia    Atrial tachycardia    Breast cancer (HCC)    Bronchiectasis (HCC) 06/2007   CHF (congestive heart failure) (HCC)    Chronic Dizziness    Colitis    COPD (chronic obstructive pulmonary disease) (HCC)    Depression    Diastolic dysfunction    a. 12/2019 Echo: EF 60-65%, no rwma, GrII DD. Nl RV size/fxn. Mild MR/AI.   DVT (deep venous thrombosis) (HCC)    after her right knee surgery.    Fibromyalgia    Labile Hypertension    Migraine headache with aura    Osteoarthritis    Osteoporosis    PAD (peripheral artery disease) (HCC)    Pneumonia    [place] 1952   Stenosis of carotid artery     Past Surgical History:   Past Surgical History:  Procedure Laterality Date   ABDOMINAL HYSTERECTOMY     BACK SURGERY     BILATERAL TOTAL MASTECTOMY WITH AXILLARY LYMPH NODE DISSECTION     BREAST SURGERY     CARPAL TUNNEL RELEASE     bilateral    COLONOSCOPY WITH PROPOFOL N/A 08/02/2015   Procedure: COLONOSCOPY WITH PROPOFOL;  Surgeon: Scot Jun, MD;  Location: [facility] ENDOSCOPY;  Service: Endoscopy;  Laterality: N/A;   FINGER TENDON REPAIR     HERNIA REPAIR     REPLACEMENT TOTAL  KNEE     right knee    RIGHT/LEFT HEART CATH AND CORONARY ANGIOGRAPHY N/A 03/03/2022   Procedure: RIGHT/LEFT HEART CATH AND CORONARY ANGIOGRAPHY;  Surgeon: Yvonne Kendall, MD;  Location: [facility] INVASIVE CV LAB;  Service: Cardiovascular;  Laterality: N/A;   SQUAMOUS CELL CARCINOMA EXCISION     TONSILLECTOMY     TOTAL KNEE ARTHROPLASTY Left 09/19/2020   Procedure: TOTAL KNEE ARTHROPLASTY;  Surgeon: Kennedy Bucker, MD;  Location: [facility] ORS;  Service: Orthopedics;  Laterality: Left;   UMBILICAL HERNIA REPAIR     VAGINAL HYSTERECTOMY      Social History:   Social History   Socioeconomic History   Marital  status: Widowed    Spouse name: Not on file   Number of children: Not on file   Years of education: Not on file   Highest education level: Not on file  Occupational History   Not on file  Tobacco Use   Smoking status: Never   Smokeless tobacco: Never  Vaping Use   Vaping status: Never Used  Substance and Sexual Activity   Alcohol use: Yes    Alcohol/week: 4.0 standard drinks of alcohol    Types: 4 Glasses of wine per week   Drug use: No   Sexual activity: Not on file  Other Topics Concern   Not on file  Social History Narrative   Not on file   Social Determinants of Health   Financial Resource Strain: Low Risk  (07/15/2023)   Received from [facility]   Overall Financial Resource Strain (CARDIA)    Difficulty of Paying Living Expenses: Not hard at all  Food Insecurity: No Food Insecurity (07/15/2023)   Received from [facility]   Hunger Vital Sign    Worried About Running Out of Food in the Last Year: Never true    Ran Out of Food in the Last Year: Never true  Transportation Needs: No Transportation Needs (07/15/2023)   Received from [facility] - Transportation    In the past 12 months, has lack of transportation kept you from medical appointments or from getting medications?: No    Lack of Transportation (Non-Medical): No  Physical Activity: Not on file  Stress: Not on file  Social Connections: Not on file  Intimate Partner Violence: Not on file    Allergies   Ivp dye [iodinated contrast media], Charentais melon (french melon), Codeine, Cucumber extract, Demeclocycline, Galcanezumab-gnlm, Sulfa antibiotics, Tegaderm ag mesh [silver], Telmisartan, Tetracyclines & related, and Wild lettuce extract (lactuca virosa)  Family history:   Family History  Problem Relation Age of Onset   Pancreatic cancer Mother    Arthritis Mother    Arthritis Father    Bladder Cancer Father    Atrial fibrillation Sister      Current Medications:   Prior to Admission medications   Medication Sig Start Date End Date Taking? Authorizing Provider  venlafaxine XR (EFFEXOR-XR) 75 MG 24 hr capsule Take 75 mg by mouth daily with breakfast. 08/11/23 08/10/24 Yes [provider]  acetaminophen (TYLENOL) 500 MG tablet Take 500 mg by mouth at bedtime as needed for moderate pain.    [provider]  albuterol (VENTOLIN HFA) 108 (90 Base) MCG/ACT inhaler Inhale 2 puffs into the lungs every 6 (six) hours as needed for wheezing or shortness of breath.    [provider]  ARIPiprazole (ABILIFY) 2 MG tablet Take 2 mg by mouth daily.     [provider]  bisoprolol (ZEBETA) 5 MG tablet TAKE 1 TABLET BY MOUTH DAILY 03/04/23   Antonieta Iba, MD  clopidogrel (PLAVIX) 75 MG tablet TAKE ONE TABLET EACH MORNING WITH BREAKFAST 05/04/23   Antonieta Iba, MD  cyclobenzaprine (FLEXERIL) 5 MG tablet Take 5 mg by mouth at bedtime as needed. 04/14/21   [provider]  diphenhydrAMINE (BENADRYL) 50 MG capsule Take one capsule 1 hour prior to scan. 02/12/23   Charlsie Quest, NP  doxazosin (CARDURA) 2 MG tablet Take 1 tablet (2 mg total) by mouth daily. 06/29/23   Antonieta Iba, MD  Evolocumab (REPATHA SURECLICK) 140 MG/ML SOAJ Inject 140 mg into the skin every 14 (fourteen) days. 12/14/22   Antonieta Iba, MD  ezetimibe (ZETIA) 10 MG tablet TAKE 1 TABLET BY MOUTH DAILY. 02/11/23   Antonieta Iba, MD  furosemide (LASIX) 40 MG tablet TAKE ONE TABLET (40 MG) BY MOUTH EVERY DAY 05/29/22   Antonieta Iba, MD  hydrOXYzine (ATARAX/VISTARIL) 25 MG tablet Take 25 mg by mouth as needed for anxiety.    [provider]  losartan (COZAAR) 25 MG tablet Take 1 tablet (25 mg total) by mouth daily. 03/05/22   Almon Hercules, MD  Multiple Vitamins-Minerals (PRESERVISION AREDS 2 PO) Take by mouth.    [provider]  Omega-3 Fatty Acids (FISH OIL OMEGA-3 PO) Take 2 capsules by mouth at bedtime.     [provider]  polyethylene glycol (MIRALAX / GLYCOLAX) packet Take 17 g by mouth daily as needed for mild constipation or moderate constipation.     [provider]  potassium chloride (KLOR-CON M) 10 MEQ tablet TAKE 1 TABLET BY MOUTH ONCE DAILY 01/11/23   Antonieta Iba, MD  predniSONE (DELTASONE) 50 MG tablet Take one tablet 13 hours, 7 hours, and 1 hour prior to scan. 02/12/23   Charlsie Quest, NP  rizatriptan (MAXALT) 5 MG tablet Take 5 mg by mouth as needed for migraine. As needed. Needed 3 times in past 3 months 01/16/20   [provider]  sodium chloride HYPERTONIC 3 % nebulizer solution Take 5 mLs by nebulization every 4 (four) hours as needed. 02/09/22   [provider]  SYMBICORT 160-4.5 MCG/ACT inhaler SMARTSIG:2 Inhalation Via Inhaler Twice Daily 08/21/21   [provider]  venlafaxine XR (EFFEXOR-XR) 150 MG 24 hr capsule Take 150 mg by mouth daily. 02/02/22   [provider]    Physical Exam:   Vitals:   08/17/23 1430 08/17/23 1500 08/17/23 1530 08/17/23 1600  BP: (!) 191/101 (!) 154/86 (!) 144/74 (!) 144/83  Pulse: 75 77 72 72  Resp: 18 20 14 18   Temp:      TempSrc:      SpO2: 97% 95% 97% 98%  Weight:         Physical Exam: Blood pressure (!) 144/83, pulse 72, temperature 98.1 F (36.7 C), temperature source Oral, resp. rate 18, weight 49.3 kg, SpO2 98%. Gen: No acute distress. Head: Normocephalic, atraumatic. Eyes: Pupils equal, round and reactive to light. Extraocular movements intact.  Sclerae nonicteric.  Mouth: Moist mucous membranes.  No oropharyngeal erythema or exudates. Neck: Supple, no thyromegaly, no lymphadenopathy, no jugular venous distention. Chest: Lungs are clear to auscultation with good air movement. No rales, rhonchi or wheezes.  CV: Heart sounds are regular with an S1, S2. No murmurs, rubs or gallops.  Abdomen: Soft, nontender, nondistended with normal active bowel sounds. No palpable  masses. Extremities: Extremities are without clubbing,  or cyanosis. No edema. Pedal pulses 2+.  Skin: Warm and dry. No rashes, lesions or wounds Neuro: Alert and oriented times 3; grossly nonfocal.  Psych: Insight is good and judgment is appropriate. Mood and affect normal.   Data Review:    Labs: Basic Metabolic Panel: Recent Labs  Lab 08/17/23 1147  NA 137  K 3.5  CL 98  CO2 30  GLUCOSE 91  BUN 20  CREATININE 0.69  CALCIUM 8.7*   Liver Function Tests: Recent Labs  Lab 08/17/23 1147  AST 50*  ALT 25  ALKPHOS 77  BILITOT 0.8  PROT 6.0*  ALBUMIN 3.9   No results for input(s): "LIPASE", "AMYLASE" in the last 168 hours. No results for input(s): "AMMONIA" in the last 168 hours. CBC: Recent Labs  Lab 08/17/23 1147  WBC 6.1  NEUTROABS 3.7  HGB 13.5  HCT 41.7  MCV 90.5  PLT 188   Cardiac Enzymes: No results for input(s): "CKTOTAL", "CKMB", "CKMBINDEX", "TROPONINI" in the last 168 hours.  BNP (last 3 results) No results for input(s): "PROBNP" in the last 8760 hours. CBG: Recent Labs  Lab 08/17/23 1132  GLUCAP 84    Urinalysis    Component Value Date/Time   COLORURINE COLORLESS (A) 08/17/2023 1159   APPEARANCEUR CLEAR (A) 08/17/2023 1159   LABSPEC 1.004 (L) 08/17/2023 1159   PHURINE 8.0 08/17/2023 1159   GLUCOSEU NEGATIVE 08/17/2023 1159   HGBUR NEGATIVE 08/17/2023 1159   BILIRUBINUR NEGATIVE 08/17/2023 1159   KETONESUR NEGATIVE 08/17/2023 1159   PROTEINUR NEGATIVE 08/17/2023 1159   NITRITE NEGATIVE 08/17/2023 1159   LEUKOCYTESUR SMALL (A) 08/17/2023 1159      Radiographic Studies: MR BRAIN WO CONTRAST  Result Date: 08/17/2023 CLINICAL DATA:  Provided history: Neuro deficit, acute, stroke suspected. EXAM: MRI HEAD WITHOUT CONTRAST MRA HEAD WITHOUT CONTRAST MRA NECK WITHOUT AND WITH CONTRAST TECHNIQUE: Multiplanar, multi-echo pulse sequences of the brain and surrounding structures were acquired without intravenous contrast. Angiographic images  of the Circle of Willis were acquired using MRA technique without intravenous contrast. Angiographic images of the neck were acquired using MRA technique without and with intravenous contrast. Carotid stenosis measurements (when applicable) are obtained utilizing NASCET criteria, using the distal internal carotid diameter as the denominator. CONTRAST:  6mL GADAVIST GADOBUTROL 1 MMOL/ML IV SOLN COMPARISON:  Non-contrast head CT 08/17/2023. FINDINGS: MRI HEAD FINDINGS Brain: Generalized cerebral atrophy. Prominence of the ventricles and sulci appears commensurate. Chronic lacunar infarct within the right basal ganglia/genu of right internal capsule, new from the prior brain MRI 01/03/2020. Background multifocal T2 FLAIR hyperintense signal abnormality within the cerebral white matter, nonspecific but compatible with moderate chronic small vessel ischemic disease. There are a few nonspecific chronic microhemorrhages scattered within the supratentorial brain. Mild chronic small vessel ischemic changes within the pons. Tiny chronic infarct again noted within the left cerebellar hemisphere. There is no acute infarct. No evidence of an intracranial mass No extra-axial fluid collection. No midline shift. Vascular: Maintained flow voids within the proximal large arterial vessels. Skull and upper cervical spine: No focal worrisome marrow lesion. Sinuses/Orbits: No mass or acute finding within the imaged orbits. Prior bilateral ocular lens replacement. Mucosal thickening within the bilateral ethmoid sinuses, overall moderate-to-severe. Small fluid level within the right sphenoid sinus. Minimal mucosal thickening also present within the bilateral frontal and bilateral maxillary sinuses. Other: Small-volume fluid within the right mastoid air cells. MRA HEAD FINDINGS Mild-to-moderately motion degraded examination. Within this limitation, findings are as follows. Anterior circulation: The intracranial internal carotid arteries  are  patent. The M1 middle cerebral arteries are patent. No M2 proximal branch occlusion is identified. The anterior cerebral arteries are patent. No intracranial aneurysm is identified. 1-2 mm inferiorly projecting vascular protrusion arising from the supraclinoid right ICA, which may reflect an aneurysm or infundibulum (series 1043, image 16). Posterior circulation: The intracranial vertebral arteries are patent. The basilar artery is patent. The posterior cerebral arteries are patent. Posterior communicating arteries are diminutive or absent, bilaterally. Anatomic variants: As described. MRA NECK FINDINGS Aortic arch: Standard aortic branching. No hemodynamically significant innominate or proximal subclavian artery stenosis. Right carotid system: CCA and ICA patent within the neck without stenosis. Left carotid system: CCA and ICA patent within the neck without stenosis. Vertebral arteries: Vertebral arteries patent within the neck without stenosis. The left vertebral artery is dominant. IMPRESSION: MRI brain: 1. No evidence of an acute intracranial abnormality. 2. Parenchymal atrophy, chronic small vessel ischemic disease and chronic infarcts as described. Of note, a chronic lacunar infarct within the right basal ganglia/genu of right internal capsule is new from the prior MRI of 01/03/2020. 3. Paranasal sinus disease as outlined. 4. Small-volume fluid within the right mastoid air cells. MRA head: 1. Mild to moderately motion degraded examination. 2. No proximal intracranial large vessel occlusion identified. 3. 1-2 mm inferiorly projecting vascular protrusion arising from the supraclinoid right internal carotid artery, which may reflect an aneurysm or infundibulum. MRA neck: The common carotid, internal carotid and vertebral arteries are patent within the neck without stenosis. Electronically Signed   By: Jackey Loge D.O.   On: 08/17/2023 13:18   MR ANGIO HEAD WO CONTRAST  Result Date: 08/17/2023 CLINICAL  DATA:  Provided history: Neuro deficit, acute, stroke suspected. EXAM: MRI HEAD WITHOUT CONTRAST MRA HEAD WITHOUT CONTRAST MRA NECK WITHOUT AND WITH CONTRAST TECHNIQUE: Multiplanar, multi-echo pulse sequences of the brain and surrounding structures were acquired without intravenous contrast. Angiographic images of the Circle of Willis were acquired using MRA technique without intravenous contrast. Angiographic images of the neck were acquired using MRA technique without and with intravenous contrast. Carotid stenosis measurements (when applicable) are obtained utilizing NASCET criteria, using the distal internal carotid diameter as the denominator. CONTRAST:  6mL GADAVIST GADOBUTROL 1 MMOL/ML IV SOLN COMPARISON:  Non-contrast head CT 08/17/2023. FINDINGS: MRI HEAD FINDINGS Brain: Generalized cerebral atrophy. Prominence of the ventricles and sulci appears commensurate. Chronic lacunar infarct within the right basal ganglia/genu of right internal capsule, new from the prior brain MRI 01/03/2020. Background multifocal T2 FLAIR hyperintense signal abnormality within the cerebral white matter, nonspecific but compatible with moderate chronic small vessel ischemic disease. There are a few nonspecific chronic microhemorrhages scattered within the supratentorial brain. Mild chronic small vessel ischemic changes within the pons. Tiny chronic infarct again noted within the left cerebellar hemisphere. There is no acute infarct. No evidence of an intracranial mass No extra-axial fluid collection. No midline shift. Vascular: Maintained flow voids within the proximal large arterial vessels. Skull and upper cervical spine: No focal worrisome marrow lesion. Sinuses/Orbits: No mass or acute finding within the imaged orbits. Prior bilateral ocular lens replacement. Mucosal thickening within the bilateral ethmoid sinuses, overall moderate-to-severe. Small fluid level within the right sphenoid sinus. Minimal mucosal thickening also  present within the bilateral frontal and bilateral maxillary sinuses. Other: Small-volume fluid within the right mastoid air cells. MRA HEAD FINDINGS Mild-to-moderately motion degraded examination. Within this limitation, findings are as follows. Anterior circulation: The intracranial internal carotid arteries are patent. The M1 middle cerebral arteries are patent. No  M2 proximal branch occlusion is identified. The anterior cerebral arteries are patent. No intracranial aneurysm is identified. 1-2 mm inferiorly projecting vascular protrusion arising from the supraclinoid right ICA, which may reflect an aneurysm or infundibulum (series 1043, image 16). Posterior circulation: The intracranial vertebral arteries are patent. The basilar artery is patent. The posterior cerebral arteries are patent. Posterior communicating arteries are diminutive or absent, bilaterally. Anatomic variants: As described. MRA NECK FINDINGS Aortic arch: Standard aortic branching. No hemodynamically significant innominate or proximal subclavian artery stenosis. Right carotid system: CCA and ICA patent within the neck without stenosis. Left carotid system: CCA and ICA patent within the neck without stenosis. Vertebral arteries: Vertebral arteries patent within the neck without stenosis. The left vertebral artery is dominant. IMPRESSION: MRI brain: 1. No evidence of an acute intracranial abnormality. 2. Parenchymal atrophy, chronic small vessel ischemic disease and chronic infarcts as described. Of note, a chronic lacunar infarct within the right basal ganglia/genu of right internal capsule is new from the prior MRI of 01/03/2020. 3. Paranasal sinus disease as outlined. 4. Small-volume fluid within the right mastoid air cells. MRA head: 1. Mild to moderately motion degraded examination. 2. No proximal intracranial large vessel occlusion identified. 3. 1-2 mm inferiorly projecting vascular protrusion arising from the supraclinoid right internal  carotid artery, which may reflect an aneurysm or infundibulum. MRA neck: The common carotid, internal carotid and vertebral arteries are patent within the neck without stenosis. Electronically Signed   By: Jackey Loge D.O.   On: 08/17/2023 13:18   MR ANGIO NECK W WO CONTRAST  Result Date: 08/17/2023 CLINICAL DATA:  Provided history: Neuro deficit, acute, stroke suspected. EXAM: MRI HEAD WITHOUT CONTRAST MRA HEAD WITHOUT CONTRAST MRA NECK WITHOUT AND WITH CONTRAST TECHNIQUE: Multiplanar, multi-echo pulse sequences of the brain and surrounding structures were acquired without intravenous contrast. Angiographic images of the Circle of Willis were acquired using MRA technique without intravenous contrast. Angiographic images of the neck were acquired using MRA technique without and with intravenous contrast. Carotid stenosis measurements (when applicable) are obtained utilizing NASCET criteria, using the distal internal carotid diameter as the denominator. CONTRAST:  6mL GADAVIST GADOBUTROL 1 MMOL/ML IV SOLN COMPARISON:  Non-contrast head CT 08/17/2023. FINDINGS: MRI HEAD FINDINGS Brain: Generalized cerebral atrophy. Prominence of the ventricles and sulci appears commensurate. Chronic lacunar infarct within the right basal ganglia/genu of right internal capsule, new from the prior brain MRI 01/03/2020. Background multifocal T2 FLAIR hyperintense signal abnormality within the cerebral white matter, nonspecific but compatible with moderate chronic small vessel ischemic disease. There are a few nonspecific chronic microhemorrhages scattered within the supratentorial brain. Mild chronic small vessel ischemic changes within the pons. Tiny chronic infarct again noted within the left cerebellar hemisphere. There is no acute infarct. No evidence of an intracranial mass No extra-axial fluid collection. No midline shift. Vascular: Maintained flow voids within the proximal large arterial vessels. Skull and upper cervical  spine: No focal worrisome marrow lesion. Sinuses/Orbits: No mass or acute finding within the imaged orbits. Prior bilateral ocular lens replacement. Mucosal thickening within the bilateral ethmoid sinuses, overall moderate-to-severe. Small fluid level within the right sphenoid sinus. Minimal mucosal thickening also present within the bilateral frontal and bilateral maxillary sinuses. Other: Small-volume fluid within the right mastoid air cells. MRA HEAD FINDINGS Mild-to-moderately motion degraded examination. Within this limitation, findings are as follows. Anterior circulation: The intracranial internal carotid arteries are patent. The M1 middle cerebral arteries are patent. No M2 proximal branch occlusion is identified. The anterior cerebral arteries  are patent. No intracranial aneurysm is identified. 1-2 mm inferiorly projecting vascular protrusion arising from the supraclinoid right ICA, which may reflect an aneurysm or infundibulum (series 1043, image 16). Posterior circulation: The intracranial vertebral arteries are patent. The basilar artery is patent. The posterior cerebral arteries are patent. Posterior communicating arteries are diminutive or absent, bilaterally. Anatomic variants: As described. MRA NECK FINDINGS Aortic arch: Standard aortic branching. No hemodynamically significant innominate or proximal subclavian artery stenosis. Right carotid system: CCA and ICA patent within the neck without stenosis. Left carotid system: CCA and ICA patent within the neck without stenosis. Vertebral arteries: Vertebral arteries patent within the neck without stenosis. The left vertebral artery is dominant. IMPRESSION: MRI brain: 1. No evidence of an acute intracranial abnormality. 2. Parenchymal atrophy, chronic small vessel ischemic disease and chronic infarcts as described. Of note, a chronic lacunar infarct within the right basal ganglia/genu of right internal capsule is new from the prior MRI of 01/03/2020. 3.  Paranasal sinus disease as outlined. 4. Small-volume fluid within the right mastoid air cells. MRA head: 1. Mild to moderately motion degraded examination. 2. No proximal intracranial large vessel occlusion identified. 3. 1-2 mm inferiorly projecting vascular protrusion arising from the supraclinoid right internal carotid artery, which may reflect an aneurysm or infundibulum. MRA neck: The common carotid, internal carotid and vertebral arteries are patent within the neck without stenosis. Electronically Signed   By: Jackey Loge D.O.   On: 08/17/2023 13:18   EEG adult  Result Date: 08/17/2023 Charlsie Quest, MD     08/17/2023  4:21 PM Patient Name: Lindsey [name] MRN: [id number] Epilepsy Attending: Charlsie Quest Referring Physician/Provider: Gordy Councilman, MD Date: 08/17/2023 Duration: 42.12 mins Patient history: 87yo female with dysarthria getting eeg to evaluate for seizure Level of alertness: Awake AEDs during EEG study: None Technical aspects: This EEG study was done with scalp electrodes positioned according to the 10-20 International system of electrode placement. Electrical activity was reviewed with band pass filter of 1-70Hz , sensitivity of 7 uV/mm, display speed of 60mm/sec with a 60Hz  notched filter applied as appropriate. EEG data were recorded continuously and digitally stored.  Video monitoring was available and reviewed as appropriate. Description: The posterior dominant rhythm consists of 8 Hz activity of moderate voltage (25-35 uV) seen predominantly in posterior head regions, symmetric and reactive to eye opening and eye closing. EEG showed intermittent generalized 3 to 6 Hz theta-delta slowing.Hyperventilation and photic stimulation were not performed.   ABNORMALITY - Intermittent slow, generalized IMPRESSION: This study is suggestive of mild diffuse encephalopathy. No seizures or epileptiform discharges were seen throughout the recording. Charlsie Quest   CT HEAD CODE STROKE  WO CONTRAST  Result Date: 08/17/2023 CLINICAL DATA:  Code stroke. Neuro deficit, acute, stroke suspected. Aphasia. EXAM: CT HEAD WITHOUT CONTRAST TECHNIQUE: Contiguous axial images were obtained from the base of the skull through the vertex without intravenous contrast. RADIATION DOSE REDUCTION: This exam was performed according to the departmental dose-optimization program which includes automated exposure control, adjustment of the mA and/or kV according to patient size and/or use of iterative reconstruction technique. COMPARISON:  Head CT 05/30/2021 FINDINGS: Brain: No focal abnormality seen affecting the brainstem or cerebellum. Some cerebellar atrophy. Cerebral hemispheres show age related volume loss with chronic small-vessel ischemic changes of the white matter. No sign of acute infarction, mass lesion, hemorrhage, hydrocephalus or extra-axial collection. Vascular: No abnormal vascular finding. Skull: Normal Sinuses/Orbits: Mild inflammatory changes of the ethmoid and sphenoid sinuses. Orbits negative.  Other: None ASPECTS (Alberta Stroke Program Early CT Score) - Ganglionic level infarction (caudate, lentiform nuclei, internal capsule, insula, M1-M3 cortex): 7 - Supraganglionic infarction (M4-M6 cortex): 3 Total score (0-10 with 10 being normal): 10 IMPRESSION: 1. No acute CT finding. Age related volume loss and chronic small-vessel ischemic changes of the white matter. 2. Aspects is 10. These results were communicated to Dr. Iver Nestle at 11:41 am on 08/17/2023 by text page via the [facility] messaging system. Electronically Signed   By: Paulina Fusi M.D.   On: 08/17/2023 11:43    EKG: Independently reviewed by me showed normal sinus rhythm, RBBB, LAFB.    Assessment/Plan:   Principal Problem:   Aphasia    Body mass index is 19.88 kg/m.   Expressive aphasia/confusion: Admit to MedSurg for observation.  Monitor on telemetry.  CT head and MRI brain did not show any evidence of acute stroke.  No  large vessel occlusion on MRA head and neck. EEG suggestive of mild diffuse encephalopathy. No seizures or epileptiform activity. Differential diagnosis includes CVA, TIA, migraine headache with aura. 2D ECHO has been ordered. Neurologist recommended Aspirin and Plavix. PT, OT and ST evaluation.  Follow up with neurologist.   Vitamin B12 deficiency: Vitamin B12 level was 298 on 08/16/2023 (done a day prior to admission).  Start vitamin B12 injection.   Cognitive impairment: Treat vitamin B12 deficiency.  Patient leaves alone and sometimes gets around with a walker. PT and OT evaluation.    Chronic diastolic CHF, moderate pulmonary hypertension: Compensated.  Continue Lasix   COPD, bronchiectasis: Compensated   Chronic neck pain: Analgesics as needed for pain.   Comorbidities include unsteady gait at baseline, migraine headaches, CAD on Plavix, depression   Other information:   DVT prophylaxis: Lovenox  Code Status: Full code.  This was discussed with the patient. Family Communication: None Disposition Plan: Plan to discharge home Consults called: Neurologist Admission status: Observation       Kendrik Mcshan [facility] Pager: Please check [email or web address]   How to contact the [facility] Attending or Consulting provider 7A - 7P or covering provider during after hours 7P -7A, for this patient?   Check the care team in [facility] and look for a) attending/consulting [facility] provider listed and b) the [facility] team listed Log into [email or web address] and use [place]'s universal password to access. If you do not have the password, please contact the hospital operator. Locate the [facility] provider you are looking for under [facility] and page to a number that you can be directly reached. If you still have difficulty reaching the provider, please page the [facility] (Director on Call) for the Hospitalists listed on amion for assistance.  08/17/2023, 6:18 PM

## 2023-08-17 NOTE — ED Notes (Signed)
Pt talking on phone. Pt ate supper tray.

## 2023-08-17 NOTE — ED Notes (Signed)
Still in MRI with pt. If MRI negative, code stroke will be canceled per Dr Iver Nestle, neurologist.

## 2023-08-17 NOTE — Progress Notes (Signed)
Patient found in bed alert and oriented, oriented to room and call bell, made comfortable, sandwich tray offered.IV intact. Tele attached.

## 2023-08-17 NOTE — ED Notes (Signed)
EEG tech at bedside. 

## 2023-08-17 NOTE — ED Provider Notes (Signed)
[facility] Provider Note    Event Date/Time   First MD Initiated Contact with Patient 08/17/23 1121     (approximate)   History   Aphasia and Altered Mental Status   HPI  Lindsey [name] is a 87 y.o. female  who comes to the emergency department today from living facility because of concern for speech difficulty.  Apparently the patient had been in her normal state of health this morning however around 10 AM she started having difficulty with expressive aphasia.  Apparently when EMS got there the symptoms had resolved however they started again during transport.        Physical Exam   Triage Vital Signs: ED Triage Vitals [08/17/23 1125]  Encounter Vitals Group     BP (!) 178/86     Systolic BP Percentile      Diastolic BP Percentile      Pulse Rate 61     Resp 14     Temp      Temp src      SpO2 100 %     Weight      Height      Head Circumference      Peak Flow      Pain Score      Pain Loc      Pain Education      Exclude from Growth Chart     Most recent vital signs: Vitals:   08/17/23 1125  BP: (!) 178/86  Pulse: 61  Resp: 14  SpO2: 100%   General: Awake, alert, oriented. CV:  Good peripheral perfusion. Regular rate and rhythm. Resp:  Normal effort. Lungs clear. Abd:  No distention.    ED Results / Procedures / Treatments   Labs (all labs ordered are listed, but only abnormal results are displayed) Labs Reviewed  COMPREHENSIVE METABOLIC PANEL - Abnormal; Notable for the following components:      Result Value   Calcium 8.7 (*)    Total Protein 6.0 (*)    AST 50 (*)    All other components within normal limits  URINALYSIS, ROUTINE W REFLEX MICROSCOPIC - Abnormal; Notable for the following components:   Color, Urine COLORLESS (*)    APPearance CLEAR (*)    Specific Gravity, Urine 1.004 (*)    Leukocytes,Ua SMALL (*)    All other components within normal limits  URINE CULTURE  ETHANOL  PROTIME-INR  APTT  CBC   DIFFERENTIAL  URINE DRUG SCREEN, QUALITATIVE ([facility] ONLY)  CBG MONITORING, ED     EKG  I, Phineas Semen, attending physician, personally viewed and interpreted this EKG  EKG Time: 1242 Rate: 64 Rhythm: sinus rhythm Axis: left axis derivation Intervals: qtc 496 QRS: RBBB, LAFB ST changes: no st elevation Impression: abnormal ekg   RADIOLOGY I independently interpreted and visualized the CT head. My interpretation: No bleed Radiology interpretation:  IMPRESSION:  1. No acute CT finding. Age related volume loss and chronic  small-vessel ischemic changes of the white matter.  2. Aspects is 10.      PROCEDURES:  Critical Care performed: No   MEDICATIONS ORDERED IN ED: Medications - No data to display   IMPRESSION / MDM / ASSESSMENT AND PLAN / ED COURSE  I reviewed the triage vital signs and the nursing notes.                              Differential diagnosis  includes, but is not limited to, CVA, TIA, seizures  Patient's presentation is most consistent with acute presentation with potential threat to life or bodily function.   The patient is on the cardiac monitor to evaluate for evidence of arrhythmia and/or significant heart rate changes.  Patient presented to the emergency department today because of concerns for word finding difficulty.  On exam patient was unable to find the words for common objects.  Because of this a code stroke was called.  Dr. Iver Nestle with neurology did evaluate.  Imaging including MRI negative.  EEG was ordered.  If negative patient potentially could be discharged.      FINAL CLINICAL IMPRESSION(S) / ED DIAGNOSES   Word finding difficulty    Note:  This document was prepared using Dragon voice recognition software and may include unintentional dictation errors.    Phineas Semen, MD 08/17/23 [phone]

## 2023-08-17 NOTE — Consult Note (Signed)
PHARMACIST CODE STROKE RESPONSE  No TNK given. Neuro reviewed MRI.    Thanks,   Ronnald Ramp 08/17/23 12:43 PM

## 2023-08-17 NOTE — Code Documentation (Signed)
Stroke Response Nurse Documentation Code Documentation  Lindsey [name] is a 87 y.o. female arriving to [facility] via [facility] on 08/17/2023 with past medical hx of cognitive impairment with aphasia at baseline, migraine, headaches, possible TIA, COPD, remote breast cancer, fibromyalgia, periphreal artery disease, carotid stenosis. On clopidogrel 75 mg daily. Code stroke was activated by ED.   Patient from [facility] where she was LKW at 1000, but unclear and now complaining of  aphasia. Per ED staff at bedside, EMS was called for slurred speech and confusion that started at 1000. Symptoms had resolved by the time EMS arrived at facility. Pt found to be aphasic on arrival to ED and code stroke was called.   Stroke team at the bedside on patient arrival. Labs drawn and patient cleared for CT by Dr. Derrill Kay. Patient to CT with team. NIHSS 5, see documentation for details and code stroke times. Patient with disoriented, not following commands, right leg weakness, and Expressive aphasia  on exam. The following imaging was completed:  CT Head and MRI. Patient is not a candidate for IV Thrombolytic due to no stroke on MRI, per MD. Patient is not a candidate for IR due to no LVO, per MD.   Care Plan: every 2 hours NIHSS and swallow screen per protocol. NIHSS order discontinued at 1330 due to MRI negative, per MD Bhagat   Bedside handoff with ED RN Barbee Cough.    Wille Glaser  Stroke Response RN

## 2023-08-17 NOTE — Procedures (Signed)
Patient Name: Lindsey [name]  MRN: [id number]  Epilepsy Attending: Charlsie Quest  Referring Physician/Provider: Gordy Councilman, MD  Date: 08/17/2023 Duration: 42.12 mins  Patient history: 87yo female with dysarthria getting eeg to evaluate for seizure  Level of alertness: Awake  AEDs during EEG study: None  Technical aspects: This EEG study was done with scalp electrodes positioned according to the 10-20 International system of electrode placement. Electrical activity was reviewed with band pass filter of 1-70Hz , sensitivity of 7 uV/mm, display speed of 49mm/sec with a 60Hz  notched filter applied as appropriate. EEG data were recorded continuously and digitally stored.  Video monitoring was available and reviewed as appropriate.  Description: The posterior dominant rhythm consists of 8 Hz activity of moderate voltage (25-35 uV) seen predominantly in posterior head regions, symmetric and reactive to eye opening and eye closing. EEG showed intermittent generalized 3 to 6 Hz theta-delta slowing.Hyperventilation and photic stimulation were not performed.     ABNORMALITY - Intermittent slow, generalized  IMPRESSION: This study is suggestive of mild diffuse encephalopathy. No seizures or epileptiform discharges were seen throughout the recording.  Navah Grondin Annabelle Harman

## 2023-08-17 NOTE — Progress Notes (Addendum)
Code stroke activated per elert @ 1132.  Pt in CT at time of activation.  Paged Dr Iver Nestle @ 1134, Dr Iver Nestle at bedside in CT to assess patient @ 1137.  Pt has contrast allergy and no acute findings on NCCT, pt to MRI @ 1142 for adv imaging.  MRS 3.    Social worker

## 2023-08-17 NOTE — ED Notes (Signed)
This RN in MRI still with pt, stroke RN and neurologist.

## 2023-08-17 NOTE — Consult Note (Signed)
Neurology Consultation Reason for Consult: Aphasia Requesting Physician: Phineas Semen  CC: Aphasia/confusion   History is obtained from: EMS, Son and chart review   HPI: Lindsey [name] is a 87 y.o. female with a past medical history significant for cognitive impairment with mild aphasia at baseline, migraine headaches, possible TIA, COPD, remote breast cancer, fibromyalgia, peripheral artery disease, carotid stenosis, DVT with IVC filter in place (placed in 2015 per chart), CAD on Repatha (statin intolerant) and plavix  EMS was activated by the patient's facility due to confusion/difficulty speaking.  They reported that on their initial arrival the patient had returned to baseline at 10 AM and this was listed as her last known well.  However her symptoms recurred during transit and she was again having difficulty with her speech on arrival to the ED  On discussion with her son, the patient does have some mild difficulty with some words, even high-frequency words such as pinky but she is normally oriented to her age and month, especially given that her birthday is coming up in a few days.  On review of her neurology records she did have a transient episode of speech difficulty which was concerning for possible TIA but ultimately felt to be a complex migraine.  She had a recent 2-day event monitor that was negative for atrial fibrillation (per cardiology note on 08/04/2023)  Neuro visit 08/16/2023 1. Spell of speech impairment with word finding difficulty. Patient previously have associated visual aura and headache. May have had some light sensitivity. Initially concerning for TIA but likely representative of Migraine - without recurrence  - Maintained on DAPT with Aspirin 81 mg + Clopidogrel (Plavix) 75 mg daily  - Continue to monitor  - MRI Brain without contrast  - Physical Therapy for imbalance  - hand exercises for right upper extremity tingling  - Labs: Vit B12, Vit B1, Vit B6, Vit  D, ESR, CRP, SIEP, folate - offered emg/ncs   2. Migraine with aura with intractable headache without status - improved  - As needed use of Cefaly with resolution of Migraines - Taking Effexor 75 mg by mouth once a day (daily)  - Tylenol as needed for headache rescue  - Will continue to follow   3. Cognitive Impairment with word finding difficulty - stable, independent in ADLs/IADLs  SLUMS [[employer] Mental Status] - 22/30  - Taking Effexor XR 75 mg by mouth once a day (daily)  - Continue to follow  - Offered speech therapy, patient deferred   4. Chronic neck pain - improved, following with Physiatry  - Seldom taking Flexeril 5 mg as needed  - Continue to follow  - Can schedule physiatry follow-up   5. Antalgic gait -  - No new falls  - Continue physical therapy - Continue use of walking stick/assistive device     LKW: Possibly 10 AM, but difficult to be confident given fluctuating symptoms and baseline mild aphasia Thrombolytic given?: No, no stroke on MRI IA performed?: No, no LVO  Premorbid modified rankin scale:      2 - Slight disability. Able to look after own affairs without assistance, but unable to carry out all previous activities.  YNW:GNFAOZ to obtain due to altered mental status, no recent concerns per son  Past Medical History:  Diagnosis Date   Anemia    Atrial tachycardia    Breast cancer (HCC)    Bronchiectasis (HCC) 06/2007   CHF (congestive heart failure) (HCC)    Chronic Dizziness  Colitis    COPD (chronic obstructive pulmonary disease) (HCC)    Depression    Diastolic dysfunction    a. 12/2019 Echo: EF 60-65%, no rwma, GrII DD. Nl RV size/fxn. Mild MR/AI.   DVT (deep venous thrombosis) (HCC)    after her right knee surgery.    Fibromyalgia    Labile Hypertension    Migraine headache with aura    Osteoarthritis    Osteoporosis    PAD (peripheral artery disease) (HCC)    Pneumonia    [place] 1952   Stenosis of carotid artery     Past Surgical History:  Procedure Laterality Date   ABDOMINAL HYSTERECTOMY     BACK SURGERY     BILATERAL TOTAL MASTECTOMY WITH AXILLARY LYMPH NODE DISSECTION     BREAST SURGERY     CARPAL TUNNEL RELEASE     bilateral    COLONOSCOPY WITH PROPOFOL N/A 08/02/2015   Procedure: COLONOSCOPY WITH PROPOFOL;  Surgeon: Scot Jun, MD;  Location: [facility] ENDOSCOPY;  Service: Endoscopy;  Laterality: N/A;   FINGER TENDON REPAIR     HERNIA REPAIR     REPLACEMENT TOTAL KNEE     right knee    RIGHT/LEFT HEART CATH AND CORONARY ANGIOGRAPHY N/A 03/03/2022   Procedure: RIGHT/LEFT HEART CATH AND CORONARY ANGIOGRAPHY;  Surgeon: Yvonne Kendall, MD;  Location: [facility] INVASIVE CV LAB;  Service: Cardiovascular;  Laterality: N/A;   SQUAMOUS CELL CARCINOMA EXCISION     TONSILLECTOMY     TOTAL KNEE ARTHROPLASTY Left 09/19/2020   Procedure: TOTAL KNEE ARTHROPLASTY;  Surgeon: Kennedy Bucker, MD;  Location: [facility] ORS;  Service: Orthopedics;  Laterality: Left;   UMBILICAL HERNIA REPAIR     VAGINAL HYSTERECTOMY     Current Outpatient Medications  Medication Instructions   acetaminophen (TYLENOL) 500 mg, Oral, At bedtime PRN   albuterol (VENTOLIN HFA) 108 (90 Base) MCG/ACT inhaler 2 puffs, Inhalation, Every 6 hours PRN   ARIPiprazole (ABILIFY) 2 mg, Oral, Daily   bisoprolol (ZEBETA) 5 mg, Oral, Daily   clopidogrel (PLAVIX) 75 MG tablet TAKE ONE TABLET EACH MORNING WITH BREAKFAST   cyclobenzaprine (FLEXERIL) 5 mg, Oral, At bedtime PRN   diphenhydrAMINE (BENADRYL) 50 MG capsule Take one capsule 1 hour prior to scan.   doxazosin (CARDURA) 2 mg, Oral, Daily   ezetimibe (ZETIA) 10 mg, Oral, Daily   furosemide (LASIX) 40 MG tablet TAKE ONE TABLET (40 MG) BY MOUTH EVERY DAY   hydrOXYzine (ATARAX) 25 mg, Oral, As needed   losartan (COZAAR) 25 mg, Oral, Daily   Multiple Vitamins-Minerals (PRESERVISION AREDS 2 PO) Oral   Omega-3 Fatty Acids (FISH OIL OMEGA-3 PO) 2 capsules, Oral, Daily at bedtime   polyethylene  glycol (MIRALAX / GLYCOLAX) 17 g, Oral, Daily PRN   potassium chloride (KLOR-CON M) 10 MEQ tablet 10 mEq, Oral, Daily   predniSONE (DELTASONE) 50 MG tablet Take one tablet 13 hours, 7 hours, and 1 hour prior to scan.   Repatha SureClick 140 mg, Subcutaneous, Every 14 days   rizatriptan (MAXALT) 5 mg, Oral, As needed, As needed. Needed 3 times in past 3 months   sodium chloride HYPERTONIC 3 % nebulizer solution 5 mLs, Nebulization, Every 4 hours PRN   SYMBICORT 160-4.5 MCG/ACT inhaler SMARTSIG:2 Inhalation Via Inhaler Twice Daily   venlafaxine XR (EFFEXOR-XR) 150 mg, Oral, Daily    Family History  Problem Relation Age of Onset   Pancreatic cancer Mother    Arthritis Mother    Arthritis Father    Bladder Cancer Father  Atrial fibrillation Sister     Social History:  reports that she has never smoked. She has never used smokeless tobacco. She reports current alcohol use of about 4.0 standard drinks of alcohol per week. She reports that she does not use drugs.   Exam: Current vital signs: BP (!) 178/86   Pulse 61   Resp 14   SpO2 100%  Vital signs in last 24 hours: Pulse Rate:  [61] 61 (11/12 1125) Resp:  [14] 14 (11/12 1125) BP: (178)/(86) 178/86 (11/12 1125) SpO2:  [100 %] 100 % (11/12 1125)   Physical Exam  Constitutional: Appears well-developed and well-nourished.  Psych: Affect appropriate to situation, cooperative but confused  Eyes: No scleral injection HENT: No oropharyngeal obstruction.  MSK: no joint deformities.  Cardiovascular: Normal rate and regular rhythm. Perfusing extremities well Respiratory: Effort normal, non-labored breathing GI: Soft.  No distension. There is no tenderness.  Skin: Warm dry and intact visible skin  Neuro: Mental Status: Patient is awake, alert, unable Cranial Nerves: II: Visual Fields are full. Pupils are equal, round, lens replacements visible  III,IV, VI: EOMI without ptosis or diploplia.  V: Facial sensation is symmetric to  light touch  VII: Facial movement is symmetric.  VIII: hearing is intact to voice XII: tongue is midline without atrophy or fasciculations.  Motor: No drift in BUE, mild drift RLE, no drift LLE Sensory: Sensation is symmetric to light touch in the arms and legs. Cerebellar: FNF and HKS are intact bilaterally Gait:  Deferred   NIHSS total 5 Score breakdown: 2 for questions, 1 for commands, 1 for RLE drift, 1 for mild to moderate aphasia Performed at 11:37 AM  I have reviewed labs in epic and the results pertinent to this consultation are:  Basic Metabolic Panel: Recent Labs  Lab 08/17/23 1147  NA 137  K 3.5  CL 98  CO2 30  GLUCOSE 91  BUN 20  CREATININE 0.69  CALCIUM 8.7*    CBC: Recent Labs  Lab 08/17/23 1147  WBC 6.1  NEUTROABS 3.7  HGB 13.5  HCT 41.7  MCV 90.5  PLT 188    Coagulation Studies: Recent Labs    08/17/23 1147  LABPROT 12.4  INR 0.9      I have reviewed the images obtained: Head CT personally reviewed, agree with radiology:  no acute intracranial process   MRI brain radiology report pending, on my initial review  No acute stroke on diffusion Atrophy  1. No evidence of an acute intracranial abnormality. 2. Parenchymal atrophy, chronic small vessel ischemic disease and chronic infarcts as described. Of note, a chronic lacunar infarct within the right basal ganglia/genu of right internal capsule is new from the prior MRI of 01/03/2020. 3. Paranasal sinus disease as outlined. 4. Small-volume fluid within the right mastoid air cells.  MRA head  On my initial limited review of left sided vessles on series 5 only no LVO   MRA head: 1. Mild to moderately motion degraded examination. 2. No proximal intracranial large vessel occlusion identified. 3. 1-2 mm inferiorly projecting vascular protrusion arising from the supraclinoid right internal carotid artery, which may reflect an aneurysm or infundibulum.   MRA neck: The common carotid,  internal carotid and vertebral arteries are patent within the neck without stenosis.    Echo 03/31/23  Left ventricular ejection fraction, by estimation, is 55 to 60%. The  left ventricle has normal function. The left ventricle has no regional  wall motion abnormalities. There is mild left ventricular  hypertrophy.  Left ventricular diastolic parameters  are consistent with Grade I diastolic dysfunction (impaired relaxation).   2. Right ventricular systolic function is normal. The right ventricular  size is not well visualized.   3. The mitral valve is normal in structure. No evidence of mitral valve  regurgitation.   4. The aortic valve was not well visualized. Aortic valve regurgitation  is not visualized.   5. The inferior vena cava is normal in size with greater than 50%  respiratory variability, suggesting right atrial pressure of 3 mmHg.  Impression: Patient with past medical history of mild cognitive impairment specifically with noted word finding difficulties at baseline, presenting with much worse word finding difficulties, similar to a prior presentation with isolated aphasia that was ultimately deemed to be complex migraine.  Given her age and potential disabling symptoms of severe aphasia, I did take her emergently to MRI to rule out stroke.  Differential otherwise includes focal seizure or fluctuating cognitive impairment secondary to her known cognitive impairment  Emergent code stroke recommendations: -No CTA due to contrast allergy -MRI brain stat for TNK decision-making, MRA head and neck for further stroke/TIA workup  Additional recommendations: -EEG   Addendum:  EEG ABNORMALITY - Intermittent slow, generalized IMPRESSION: This study is suggestive of mild diffuse encephalopathy. No seizures or epileptiform discharges were seen throughout the recording.  If back to baseline, appropriate to discharge with close outpatient followup with her neurologist. If not at  baseline, admit for observation, may need higher level of care going forward   Brooke Dare MD-PhD [facility] [phone] Available 7 AM to 7 PM, outside these hours please contact Neurologist on call listed on AMION    Total critical care time: 80 minutes   Critical care time was exclusive of separately billable procedures and treating other patients.   Critical care was necessary to treat or prevent imminent or life-threatening deterioration.   Critical care was time spent personally by me on the following activities: development of treatment plan with patient and/or surrogate as well as nursing, discussions with consultants/primary team, evaluation of patient's response to treatment, examination of patient, obtaining history from patient or surrogate, ordering and performing treatments and interventions, ordering and review of laboratory studies, ordering and review of radiographic studies, and re-evaluation of patient's condition as needed, as documented above.

## 2023-08-17 NOTE — Plan of Care (Signed)
  Problem: Education: Goal: Knowledge of General Education information will improve Description: Including pain rating scale, medication(s)/side effects and non-pharmacologic comfort measures Outcome: Progressing   Problem: Clinical Measurements: Goal: Ability to maintain clinical measurements within normal limits will improve Outcome: Progressing   Problem: Activity: Goal: Risk for activity intolerance will decrease Outcome: Progressing   Problem: Nutrition: Goal: Adequate nutrition will be maintained Outcome: Progressing   Problem: Coping: Goal: Level of anxiety will decrease Outcome: Progressing   Problem: Elimination: Goal: Will not experience complications related to bowel motility Outcome: Progressing   Problem: Pain Management: Goal: General experience of comfort will improve Outcome: Progressing   Problem: Safety: Goal: Ability to remain free from injury will improve Outcome: Progressing   Problem: Skin Integrity: Goal: Risk for impaired skin integrity will decrease Outcome: Progressing

## 2023-08-17 NOTE — ED Notes (Signed)
EDP at bedside talking to pt about plan of care.

## 2023-08-17 NOTE — Progress Notes (Signed)
Eeg done 

## 2023-08-17 NOTE — ED Provider Notes (Signed)
  Physical Exam  BP (!) 144/83   Pulse 72   Temp 98.1 F (36.7 C) (Oral)   Resp 18   Wt 49.3 kg   SpO2 98%   BMI 19.88 kg/m   Physical Exam  Procedures  Procedures  ED Course / MDM    Medical Decision Making Amount and/or Complexity of Data Reviewed Labs: ordered. Radiology: ordered.  Risk Decision regarding hospitalization.   Received patient in signout.  87 year old female presenting today for difficulty word finding.  Reportedly in normal state of health until 10 AM.  Had expressive aphasia which resolved.  Symptoms then started again during transport and stroke alert was called.  Stroke workup largely reassuring with negative CTs and MRIs.  Neurology evaluated patient and recommended EEG.  Follow-up of EEG largely unremarkable.  Discussed case with neurologist.  She stated that if patient was back at her baseline they could be discharged back to her facility.  Otherwise, likely admission for observation and may need higher level of care going home.  Will admit to the hospitalist given that patient still states that she is not back at her baseline with having worsening word finding difficulties.     Janith Lima, MD 08/17/23 [phone]

## 2023-08-18 ENCOUNTER — Observation Stay
Admit: 2023-08-18 | Discharge: 2023-08-18 | Disposition: A | Discharge status: Home / Self Care | Payer: Medicare Other | Attending: Internal Medicine | Admitting: Internal Medicine

## 2023-08-18 DIAGNOSIS — G459 Transient cerebral ischemic attack, unspecified: Secondary | ICD-10-CM

## 2023-08-18 DIAGNOSIS — R4182 Altered mental status, unspecified: Secondary | ICD-10-CM

## 2023-08-18 DIAGNOSIS — R569 Unspecified convulsions: Secondary | ICD-10-CM

## 2023-08-18 DIAGNOSIS — R4701 Aphasia: Secondary | ICD-10-CM | POA: Diagnosis not present

## 2023-08-18 LAB — ECHOCARDIOGRAM COMPLETE
AR max vel: 2.23 cm2
AV Area VTI: 2.43 cm2
AV Area mean vel: 2.13 cm2
AV Mean grad: 4 mm[Hg]
AV Peak grad: 6.9 mm[Hg]
Ao pk vel: 1.31 m/s
Area-P 1/2: 2.6 cm2
Height: 62 in
MV VTI: 2.28 cm2
P 1/2 time: 547 ms
S' Lateral: 2.6 cm
Single Plane A4C EF: 53.1 %
Weight: 1739.2 [oz_av]

## 2023-08-18 LAB — HEMOGLOBIN A1C
Hgb A1c MFr Bld: 5.7 % — ABNORMAL HIGH (ref 4.8–5.6)
Mean Plasma Glucose: 116.89 mg/dL

## 2023-08-18 LAB — LIPID PANEL
Cholesterol: 152 mg/dL (ref 0–200)
HDL: 110 mg/dL (ref >40)
LDL Cholesterol: 33 mg/dL (ref 0–99)
Total CHOL/HDL Ratio: 1.4 {ratio}
Triglycerides: 45 mg/dL (ref <150)
VLDL: 9 mg/dL (ref 0–40)

## 2023-08-18 LAB — C-REACTIVE PROTEIN: CRP: <0.5 mg/dL (ref <1.0)

## 2023-08-18 LAB — VITAMIN D 25 HYDROXY (VIT D DEFICIENCY, FRACTURES): Vit D, 25-Hydroxy: 45.98 ng/mL (ref 30–100)

## 2023-08-18 MED ORDER — ASPIRIN 81 MG PO TBEC: 81.0000 mg | DELAYED_RELEASE_TABLET | Freq: Every day | ORAL | Status: AC

## 2023-08-18 MED ORDER — VITAMIN B-12 1000 MCG PO TABS: 1000.0000 ug | ORAL_TABLET | Freq: Every day | ORAL | Status: AC

## 2023-08-18 MED ORDER — PERFLUTREN LIPID MICROSPHERE
1.0000 mL | INTRAVENOUS | Status: AC | PRN
  Administered 2023-08-18: 5 mL via INTRAVENOUS

## 2023-08-18 NOTE — Evaluation (Signed)
Physical Therapy Evaluation Patient Details Name: Lindsey [name] MRN: [id number] DOB: [birth year] Today's Date: 08/18/2023  History of Present Illness  Lindsey [name] is a 87 y.o. female with medical history significant for chronic diastolic CHF,  CAD on Plavix, COPD, depression, chronic dizziness, bronchiectasis, PAD, migraine headaches with aura, labile hypertension, fibromyalgia, history of DVT after right knee surgery, s/pIVC filter, colitis, atrial tachycardia, anemia, carotid artery stenosis, osteoporosis, osteoarthritis. She presented to the hospital because of difficulty speaking and confusion. MRI brain did not show any evidence of acute stroke  Clinical Impression  Patient received in bed requesting to get up to the bathroom. Patient is HOH. She is mod I with bed mobility and transfers with cga. Patient with mild initial standing dizziness which subsided. She is able to ambulate into bathroom and perform self care independently. Cga needed for patient to stand from low toilet. She ambulated 150 feet with cga. Patient is not far from her baseline. No skilled needs at this time.           If plan is discharge home, recommend the following:  N/a   Can travel by private vehicle    yes    Equipment Recommendations None recommended by PT  Recommendations for Other Services    N/a   Functional Status Assessment Patient has had a recent decline in their functional status and demonstrates the ability to make significant improvements in function in a reasonable and predictable amount of time.     Precautions / Restrictions Precautions Precautions: Fall Restrictions Weight Bearing Restrictions: No      Mobility  Bed Mobility Overal bed mobility: Independent                  Transfers Overall transfer level: Needs assistance Equipment used: None Transfers: Sit to/from Stand Sit to Stand: Contact guard assist           General transfer comment: with initial  standing from bed, patient required cga and reports mild dizziness that passed with standing still. Cga for low toilet height    Ambulation/Gait Ambulation/Gait assistance: Contact guard assist, Supervision Gait Distance (Feet): 150 Feet Assistive device: 1 person hand held assist Gait Pattern/deviations: Step-through pattern, Decreased step length - right, Decreased step length - left, Decreased stride length Gait velocity: decr     General Gait Details: patient ambulated with cga for safety. She feels like she is close to baseline and can return home  Stairs            Wheelchair Mobility     Tilt Bed    Modified Rankin (Stroke Patients Only)       Balance Overall balance assessment: Needs assistance Sitting-balance support: Feet supported Sitting balance-Leahy Scale: Normal     Standing balance support: During functional activity, No upper extremity supported Standing balance-Leahy Scale: Fair                               Pertinent Vitals/Pain Pain Assessment Pain Assessment: No/denies pain    Home Living Family/patient expects to be discharged to:: Other (Comment)                   Additional Comments: [facility]    Prior Function Prior Level of Function : Independent/Modified Independent             Mobility Comments: has rollator if needed, but was not using PTA ADLs Comments: independent  Extremity/Trunk Assessment   Upper Extremity Assessment Upper Extremity Assessment: Defer to OT evaluation    Lower Extremity Assessment Lower Extremity Assessment: Overall WFL for tasks assessed    Cervical / Trunk Assessment Cervical / Trunk Assessment: Normal  Communication   Communication Communication: Hearing impairment Cueing Techniques: Verbal cues  Cognition Arousal: Alert Behavior During Therapy: WFL for tasks assessed/performed Overall Cognitive Status: Within Functional Limits for tasks assessed                                           General Comments      Exercises     Assessment/Plan    PT Assessment Patient needs continued PT services  PT Problem List Decreased balance       PT Treatment Interventions      PT Goals (Current goals can be found in the Care Plan section)  Acute Rehab PT Goals Patient Stated Goal: to return home PT Goal Formulation: With patient Time For Goal Achievement: 08/21/23 Potential to Achieve Goals: Good    Frequency       Co-evaluation   Reason for Co-Treatment: To address functional/ADL transfers PT goals addressed during session: Mobility/safety with mobility OT goals addressed during session: ADL's and self-care       AM-PAC PT "6 Clicks" Mobility  Outcome Measure Help needed turning from your back to your side while in a flat bed without using bedrails?: None Help needed moving from lying on your back to sitting on the side of a flat bed without using bedrails?: None Help needed moving to and from a bed to a chair (including a wheelchair)?: None Help needed standing up from a chair using your arms (e.g., wheelchair or bedside chair)?: A Little Help needed to walk in hospital room?: A Little Help needed climbing 3-5 steps with a railing? : A Little 6 Click Score: 21    End of Session   Activity Tolerance: Patient tolerated treatment well Patient left: in chair;with call bell/phone within reach;with chair alarm set;with nursing/sitter in room Nurse Communication: Mobility status PT Visit Diagnosis: Unsteadiness on feet (R26.81)    Time: 6962-9528 PT Time Calculation (min) (ACUTE ONLY): 19 min   Charges:   PT Evaluation $PT Eval Low Complexity: 1 Low   PT General Charges $$ ACUTE PT VISIT: 1 Visit         Mihika Surrette, PT, GCS 08/18/23,10:22 AM

## 2023-08-18 NOTE — Discharge Summary (Signed)
Physician Discharge Summary   Patient: Lindsey [name] MRN: [id number] DOB: 01-Jul-1936  Admit date:     08/17/2023  Discharge date: 08/18/23  Discharge Physician: Lurene Shadow   PCP: Barbette Reichmann, MD   Recommendations at discharge:   Follow-up with Dr. Cristopher Peru, neurologist, in 1 week Follow-up with PCP in 1 to 2 weeks  Discharge Diagnoses: Principal Problem:   Aphasia Active Problems:   Seizure (HCC)   Altered mental status  Resolved Problems:   * No resolved hospital problems. *  Hospital Course:  Lindsey [name] is a 87 y.o. female with medical history significant for chronic diastolic CHF,  CAD on Plavix, COPD, depression, chronic dizziness, bronchiectasis, PAD, migraine headaches with aura, labile hypertension, fibromyalgia, history of DVT after right knee surgery, s/pIVC filter, colitis, atrial tachycardia, anemia, carotid artery stenosis, osteoporosis, osteoarthritis.  Recent 2 day event monitor applied on late October 2024 did not show any evidence of atrial fibrillation or atrial flutter.  She has a history of migraine headache but she says she has not had a migraine in long time.  She presented to the hospital because of difficulty speaking and confusion.  Apparently symptoms started around 10 AM, though patient told me it started around 11 AM.  Per ED report and chart review symptoms had resolved by the time EMS arrived.  However, symptoms recurred en route to the emergency department.  She did not have any other symptoms.  Of note, she complained of a mild headache but she said she developed headache after she arrived in the ED, and she attributed this to poor positioning of her head/neck on the pillow in the ED.  No unilateral weakness or numbness in the extremities, no changes in vision, no headache (prior to admission), no vomiting and no difficulty walking.  She said that she has had unsteady gait for about 2 years since she had COVID-19 infection.  She has a  walker that she uses every now and then.  She has not had any recent falls.  No fever, chills, urinary symptoms, chest pain, shortness of breath, cough, leg swelling, extremity pain, vomiting, abdominal pain or diarrhea.     Assessment and Plan:   Expressive aphasia/confusion: Resolved. CT head and MRI brain did not show any evidence of acute stroke.  No large vessel occlusion on MRA head and neck. EEG suggestive of mild diffuse encephalopathy. No seizures or epileptiform activity noted. Differential diagnosis include migraine headache with aura and TIA. Neurologist recommended Aspirin and Plavix. Follow-up with Dr. Sherryll Burger, outpatient neurologist, in 1 week No skilled PT, OT or ST was required at discharge after evaluation.     Vitamin B12 deficiency: Vitamin B12 level was 298 on 08/16/2023 (done a day prior to admission).  She will be discharged on oral vitamin B12 supplement.  Recommended follow-up with PCP or neurologist to consider vitamin B12 injections.     Cognitive impairment: Treat vitamin B12 deficiency.  Patient leaves alone and sometimes gets around with a walker.   Follow-up with neurologist for neurocognitive testing as an outpatient    Chronic diastolic CHF, moderate pulmonary hypertension: Compensated.  Continue Lasix     COPD, bronchiectasis: Compensated     Chronic neck pain: Analgesics as needed for pain.     Comorbidities include unsteady gait at baseline, migraine headaches, CAD on Plavix, depression    Her condition has improved significantly.  Speech is normal.  She has good recollection of our interaction yesterday.  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  She said she has somebody who can check on her from time to time at home.  She feels ready to go home today. Discharge plan discussed with Dr. Iver Nestle, neurologist.       Consultants: Neurologist Procedures performed: None Disposition: Home Diet recommendation:  Discharge Diet  Orders (From admission, onward)     Start     Ordered   08/18/23 0000  Diet - low sodium heart healthy        08/18/23 1151           Cardiac diet DISCHARGE MEDICATION: Allergies as of 08/18/2023       Reactions   Ivp Dye [iodinated Contrast Media] Anaphylaxis   Charentais Melon (french Melon) Other (See Comments)   All melons cause stomach pain and upset   Codeine Other (See Comments)   Dizziness  Dizziness   Cucumber Extract    Demeclocycline Other (See Comments)   Stomach upset    Galcanezumab-gnlm Itching   Sulfa Antibiotics Hives   Tegaderm Ag Mesh [silver]    Telmisartan    dizziness Other reaction(s): Dizziness dizziness   Tetracyclines & Related    Stomach upset    Wild Lettuce Extract (lactuca Virosa) Other (See Comments)   Stomach upset and pain        Medication List     STOP taking these medications    rizatriptan 5 MG tablet Commonly known as: MAXALT       TAKE these medications    acetaminophen 500 MG tablet Commonly known as: TYLENOL Take 500 mg by mouth at bedtime as needed for moderate pain.   albuterol 108 (90 Base) MCG/ACT inhaler Commonly known as: VENTOLIN HFA Inhale 2 puffs into the lungs every 6 (six) hours as needed for wheezing or shortness of breath.   ARIPiprazole 2 MG tablet Commonly known as: ABILIFY Take 2 mg by mouth daily.   aspirin EC 81 MG tablet Take 1 tablet (81 mg total) by mouth daily. Swallow whole. Start taking on: August 19, 2023   bisoprolol 5 MG tablet Commonly known as: ZEBETA TAKE 1 TABLET BY MOUTH DAILY   clopidogrel 75 MG tablet Commonly known as: PLAVIX TAKE ONE TABLET EACH MORNING WITH BREAKFAST   cyanocobalamin 1000 MCG tablet Commonly known as: VITAMIN B12 Take 1 tablet (1,000 mcg total) by mouth daily.   cyclobenzaprine 5 MG tablet Commonly known as: FLEXERIL Take 5 mg by mouth at bedtime as needed.   diphenhydrAMINE 50 MG capsule Commonly known as: BENADRYL Take one capsule 1  hour prior to scan.   doxazosin 2 MG tablet Commonly known as: CARDURA Take 1 tablet (2 mg total) by mouth daily.   ezetimibe 10 MG tablet Commonly known as: ZETIA TAKE 1 TABLET BY MOUTH DAILY.   FISH OIL OMEGA-3 PO Take 2 capsules by mouth at bedtime.   furosemide 40 MG tablet Commonly known as: LASIX TAKE ONE TABLET (40 MG) BY MOUTH EVERY DAY   hydrOXYzine 25 MG tablet Commonly known as: ATARAX Take 25 mg by mouth as needed for anxiety.   losartan 25 MG tablet Commonly known as: COZAAR Take 1 tablet (25 mg total) by mouth daily.   polyethylene glycol 17 g packet Commonly known as: MIRALAX / GLYCOLAX Take 17 g by mouth daily as needed for mild constipation or moderate constipation.   potassium chloride 10 MEQ tablet Commonly known as: KLOR-CON M TAKE 1 TABLET BY MOUTH ONCE DAILY   predniSONE 50  MG tablet Commonly known as: DELTASONE Take one tablet 13 hours, 7 hours, and 1 hour prior to scan.   PRESERVISION AREDS 2 PO Take by mouth.   Repatha SureClick 140 MG/ML Soaj Generic drug: Evolocumab Inject 140 mg into the skin every 14 (fourteen) days.   sodium chloride HYPERTONIC 3 % nebulizer solution Take 5 mLs by nebulization every 4 (four) hours as needed.   Symbicort 160-4.5 MCG/ACT inhaler Generic drug: budesonide-formoterol SMARTSIG:2 Inhalation Via Inhaler Twice Daily   venlafaxine XR 150 MG 24 hr capsule Commonly known as: EFFEXOR-XR Take 150 mg by mouth daily.   venlafaxine XR 75 MG 24 hr capsule Commonly known as: EFFEXOR-XR Take 75 mg by mouth daily with breakfast.        Follow-up Information     Lonell Face, MD. Schedule an appointment as soon as possible for a visit in 1 week(s).   Specialty: Neurology Contact information: [phone] [facility] [address] [facility] [place] [address] [phone]                Discharge Exam: Filed Weights   08/17/23 1223  Weight: 49.3 kg   GEN: NAD SKIN: No rash EYES:  EOMI, PERRLA, no nystagmus ENT: MMM CV: RRR PULM: CTA B ABD: soft, ND, NT, +BS CNS: AAO x 3, non focal, speech is clear and coherent  EXT: No edema or tenderness   Condition at discharge: good  The results of significant diagnostics from this hospitalization (including imaging, microbiology, ancillary and laboratory) are listed below for reference.   Imaging Studies: MR BRAIN WO CONTRAST  Result Date: 08/17/2023 CLINICAL DATA:  Provided history: Neuro deficit, acute, stroke suspected. EXAM: MRI HEAD WITHOUT CONTRAST MRA HEAD WITHOUT CONTRAST MRA NECK WITHOUT AND WITH CONTRAST TECHNIQUE: Multiplanar, multi-echo pulse sequences of the brain and surrounding structures were acquired without intravenous contrast. Angiographic images of the Circle of Willis were acquired using MRA technique without intravenous contrast. Angiographic images of the neck were acquired using MRA technique without and with intravenous contrast. Carotid stenosis measurements (when applicable) are obtained utilizing NASCET criteria, using the distal internal carotid diameter as the denominator. CONTRAST:  6mL GADAVIST GADOBUTROL 1 MMOL/ML IV SOLN COMPARISON:  Non-contrast head CT 08/17/2023. FINDINGS: MRI HEAD FINDINGS Brain: Generalized cerebral atrophy. Prominence of the ventricles and sulci appears commensurate. Chronic lacunar infarct within the right basal ganglia/genu of right internal capsule, new from the prior brain MRI 01/03/2020. Background multifocal T2 FLAIR hyperintense signal abnormality within the cerebral white matter, nonspecific but compatible with moderate chronic small vessel ischemic disease. There are a few nonspecific chronic microhemorrhages scattered within the supratentorial brain. Mild chronic small vessel ischemic changes within the pons. Tiny chronic infarct again noted within the left cerebellar hemisphere. There is no acute infarct. No evidence of an intracranial mass No extra-axial fluid  collection. No midline shift. Vascular: Maintained flow voids within the proximal large arterial vessels. Skull and upper cervical spine: No focal worrisome marrow lesion. Sinuses/Orbits: No mass or acute finding within the imaged orbits. Prior bilateral ocular lens replacement. Mucosal thickening within the bilateral ethmoid sinuses, overall moderate-to-severe. Small fluid level within the right sphenoid sinus. Minimal mucosal thickening also present within the bilateral frontal and bilateral maxillary sinuses. Other: Small-volume fluid within the right mastoid air cells. MRA HEAD FINDINGS Mild-to-moderately motion degraded examination. Within this limitation, findings are as follows. Anterior circulation: The intracranial internal carotid arteries are patent. The M1 middle cerebral arteries are patent. No M2 proximal branch occlusion is identified.  The anterior cerebral arteries are patent. No intracranial aneurysm is identified. 1-2 mm inferiorly projecting vascular protrusion arising from the supraclinoid right ICA, which may reflect an aneurysm or infundibulum (series 1043, image 16). Posterior circulation: The intracranial vertebral arteries are patent. The basilar artery is patent. The posterior cerebral arteries are patent. Posterior communicating arteries are diminutive or absent, bilaterally. Anatomic variants: As described. MRA NECK FINDINGS Aortic arch: Standard aortic branching. No hemodynamically significant innominate or proximal subclavian artery stenosis. Right carotid system: CCA and ICA patent within the neck without stenosis. Left carotid system: CCA and ICA patent within the neck without stenosis. Vertebral arteries: Vertebral arteries patent within the neck without stenosis. The left vertebral artery is dominant. IMPRESSION: MRI brain: 1. No evidence of an acute intracranial abnormality. 2. Parenchymal atrophy, chronic small vessel ischemic disease and chronic infarcts as described. Of note, a  chronic lacunar infarct within the right basal ganglia/genu of right internal capsule is new from the prior MRI of 01/03/2020. 3. Paranasal sinus disease as outlined. 4. Small-volume fluid within the right mastoid air cells. MRA head: 1. Mild to moderately motion degraded examination. 2. No proximal intracranial large vessel occlusion identified. 3. 1-2 mm inferiorly projecting vascular protrusion arising from the supraclinoid right internal carotid artery, which may reflect an aneurysm or infundibulum. MRA neck: The common carotid, internal carotid and vertebral arteries are patent within the neck without stenosis. Electronically Signed   By: Jackey Loge D.O.   On: 08/17/2023 13:18   MR ANGIO HEAD WO CONTRAST  Result Date: 08/17/2023 CLINICAL DATA:  Provided history: Neuro deficit, acute, stroke suspected. EXAM: MRI HEAD WITHOUT CONTRAST MRA HEAD WITHOUT CONTRAST MRA NECK WITHOUT AND WITH CONTRAST TECHNIQUE: Multiplanar, multi-echo pulse sequences of the brain and surrounding structures were acquired without intravenous contrast. Angiographic images of the Circle of Willis were acquired using MRA technique without intravenous contrast. Angiographic images of the neck were acquired using MRA technique without and with intravenous contrast. Carotid stenosis measurements (when applicable) are obtained utilizing NASCET criteria, using the distal internal carotid diameter as the denominator. CONTRAST:  6mL GADAVIST GADOBUTROL 1 MMOL/ML IV SOLN COMPARISON:  Non-contrast head CT 08/17/2023. FINDINGS: MRI HEAD FINDINGS Brain: Generalized cerebral atrophy. Prominence of the ventricles and sulci appears commensurate. Chronic lacunar infarct within the right basal ganglia/genu of right internal capsule, new from the prior brain MRI 01/03/2020. Background multifocal T2 FLAIR hyperintense signal abnormality within the cerebral white matter, nonspecific but compatible with moderate chronic small vessel ischemic disease.  There are a few nonspecific chronic microhemorrhages scattered within the supratentorial brain. Mild chronic small vessel ischemic changes within the pons. Tiny chronic infarct again noted within the left cerebellar hemisphere. There is no acute infarct. No evidence of an intracranial mass No extra-axial fluid collection. No midline shift. Vascular: Maintained flow voids within the proximal large arterial vessels. Skull and upper cervical spine: No focal worrisome marrow lesion. Sinuses/Orbits: No mass or acute finding within the imaged orbits. Prior bilateral ocular lens replacement. Mucosal thickening within the bilateral ethmoid sinuses, overall moderate-to-severe. Small fluid level within the right sphenoid sinus. Minimal mucosal thickening also present within the bilateral frontal and bilateral maxillary sinuses. Other: Small-volume fluid within the right mastoid air cells. MRA HEAD FINDINGS Mild-to-moderately motion degraded examination. Within this limitation, findings are as follows. Anterior circulation: The intracranial internal carotid arteries are patent. The M1 middle cerebral arteries are patent. No M2 proximal branch occlusion is identified. The anterior cerebral arteries are patent. No intracranial aneurysm is identified.  1-2 mm inferiorly projecting vascular protrusion arising from the supraclinoid right ICA, which may reflect an aneurysm or infundibulum (series 1043, image 16). Posterior circulation: The intracranial vertebral arteries are patent. The basilar artery is patent. The posterior cerebral arteries are patent. Posterior communicating arteries are diminutive or absent, bilaterally. Anatomic variants: As described. MRA NECK FINDINGS Aortic arch: Standard aortic branching. No hemodynamically significant innominate or proximal subclavian artery stenosis. Right carotid system: CCA and ICA patent within the neck without stenosis. Left carotid system: CCA and ICA patent within the neck without  stenosis. Vertebral arteries: Vertebral arteries patent within the neck without stenosis. The left vertebral artery is dominant. IMPRESSION: MRI brain: 1. No evidence of an acute intracranial abnormality. 2. Parenchymal atrophy, chronic small vessel ischemic disease and chronic infarcts as described. Of note, a chronic lacunar infarct within the right basal ganglia/genu of right internal capsule is new from the prior MRI of 01/03/2020. 3. Paranasal sinus disease as outlined. 4. Small-volume fluid within the right mastoid air cells. MRA head: 1. Mild to moderately motion degraded examination. 2. No proximal intracranial large vessel occlusion identified. 3. 1-2 mm inferiorly projecting vascular protrusion arising from the supraclinoid right internal carotid artery, which may reflect an aneurysm or infundibulum. MRA neck: The common carotid, internal carotid and vertebral arteries are patent within the neck without stenosis. Electronically Signed   By: Jackey Loge D.O.   On: 08/17/2023 13:18   MR ANGIO NECK W WO CONTRAST  Result Date: 08/17/2023 CLINICAL DATA:  Provided history: Neuro deficit, acute, stroke suspected. EXAM: MRI HEAD WITHOUT CONTRAST MRA HEAD WITHOUT CONTRAST MRA NECK WITHOUT AND WITH CONTRAST TECHNIQUE: Multiplanar, multi-echo pulse sequences of the brain and surrounding structures were acquired without intravenous contrast. Angiographic images of the Circle of Willis were acquired using MRA technique without intravenous contrast. Angiographic images of the neck were acquired using MRA technique without and with intravenous contrast. Carotid stenosis measurements (when applicable) are obtained utilizing NASCET criteria, using the distal internal carotid diameter as the denominator. CONTRAST:  6mL GADAVIST GADOBUTROL 1 MMOL/ML IV SOLN COMPARISON:  Non-contrast head CT 08/17/2023. FINDINGS: MRI HEAD FINDINGS Brain: Generalized cerebral atrophy. Prominence of the ventricles and sulci appears  commensurate. Chronic lacunar infarct within the right basal ganglia/genu of right internal capsule, new from the prior brain MRI 01/03/2020. Background multifocal T2 FLAIR hyperintense signal abnormality within the cerebral white matter, nonspecific but compatible with moderate chronic small vessel ischemic disease. There are a few nonspecific chronic microhemorrhages scattered within the supratentorial brain. Mild chronic small vessel ischemic changes within the pons. Tiny chronic infarct again noted within the left cerebellar hemisphere. There is no acute infarct. No evidence of an intracranial mass No extra-axial fluid collection. No midline shift. Vascular: Maintained flow voids within the proximal large arterial vessels. Skull and upper cervical spine: No focal worrisome marrow lesion. Sinuses/Orbits: No mass or acute finding within the imaged orbits. Prior bilateral ocular lens replacement. Mucosal thickening within the bilateral ethmoid sinuses, overall moderate-to-severe. Small fluid level within the right sphenoid sinus. Minimal mucosal thickening also present within the bilateral frontal and bilateral maxillary sinuses. Other: Small-volume fluid within the right mastoid air cells. MRA HEAD FINDINGS Mild-to-moderately motion degraded examination. Within this limitation, findings are as follows. Anterior circulation: The intracranial internal carotid arteries are patent. The M1 middle cerebral arteries are patent. No M2 proximal branch occlusion is identified. The anterior cerebral arteries are patent. No intracranial aneurysm is identified. 1-2 mm inferiorly projecting vascular protrusion arising from the supraclinoid  right ICA, which may reflect an aneurysm or infundibulum (series 1043, image 16). Posterior circulation: The intracranial vertebral arteries are patent. The basilar artery is patent. The posterior cerebral arteries are patent. Posterior communicating arteries are diminutive or absent,  bilaterally. Anatomic variants: As described. MRA NECK FINDINGS Aortic arch: Standard aortic branching. No hemodynamically significant innominate or proximal subclavian artery stenosis. Right carotid system: CCA and ICA patent within the neck without stenosis. Left carotid system: CCA and ICA patent within the neck without stenosis. Vertebral arteries: Vertebral arteries patent within the neck without stenosis. The left vertebral artery is dominant. IMPRESSION: MRI brain: 1. No evidence of an acute intracranial abnormality. 2. Parenchymal atrophy, chronic small vessel ischemic disease and chronic infarcts as described. Of note, a chronic lacunar infarct within the right basal ganglia/genu of right internal capsule is new from the prior MRI of 01/03/2020. 3. Paranasal sinus disease as outlined. 4. Small-volume fluid within the right mastoid air cells. MRA head: 1. Mild to moderately motion degraded examination. 2. No proximal intracranial large vessel occlusion identified. 3. 1-2 mm inferiorly projecting vascular protrusion arising from the supraclinoid right internal carotid artery, which may reflect an aneurysm or infundibulum. MRA neck: The common carotid, internal carotid and vertebral arteries are patent within the neck without stenosis. Electronically Signed   By: Jackey Loge D.O.   On: 08/17/2023 13:18   EEG adult  Result Date: 08/17/2023 Charlsie Quest, MD     08/17/2023  4:21 PM Patient Name: Lindsey [name] MRN: [id number] Epilepsy Attending: Charlsie Quest Referring Physician/Provider: Gordy Councilman, MD Date: 08/17/2023 Duration: 42.12 mins Patient history: 87yo female with dysarthria getting eeg to evaluate for seizure Level of alertness: Awake AEDs during EEG study: None Technical aspects: This EEG study was done with scalp electrodes positioned according to the 10-20 International system of electrode placement. Electrical activity was reviewed with band pass filter of 1-70Hz , sensitivity  of 7 uV/mm, display speed of 31mm/sec with a 60Hz  notched filter applied as appropriate. EEG data were recorded continuously and digitally stored.  Video monitoring was available and reviewed as appropriate. Description: The posterior dominant rhythm consists of 8 Hz activity of moderate voltage (25-35 uV) seen predominantly in posterior head regions, symmetric and reactive to eye opening and eye closing. EEG showed intermittent generalized 3 to 6 Hz theta-delta slowing.Hyperventilation and photic stimulation were not performed.   ABNORMALITY - Intermittent slow, generalized IMPRESSION: This study is suggestive of mild diffuse encephalopathy. No seizures or epileptiform discharges were seen throughout the recording. Charlsie Quest   CT HEAD CODE STROKE WO CONTRAST  Result Date: 08/17/2023 CLINICAL DATA:  Code stroke. Neuro deficit, acute, stroke suspected. Aphasia. EXAM: CT HEAD WITHOUT CONTRAST TECHNIQUE: Contiguous axial images were obtained from the base of the skull through the vertex without intravenous contrast. RADIATION DOSE REDUCTION: This exam was performed according to the departmental dose-optimization program which includes automated exposure control, adjustment of the mA and/or kV according to patient size and/or use of iterative reconstruction technique. COMPARISON:  Head CT 05/30/2021 FINDINGS: Brain: No focal abnormality seen affecting the brainstem or cerebellum. Some cerebellar atrophy. Cerebral hemispheres show age related volume loss with chronic small-vessel ischemic changes of the white matter. No sign of acute infarction, mass lesion, hemorrhage, hydrocephalus or extra-axial collection. Vascular: No abnormal vascular finding. Skull: Normal Sinuses/Orbits: Mild inflammatory changes of the ethmoid and sphenoid sinuses. Orbits negative. Other: None ASPECTS (Alberta Stroke Program Early CT Score) - Ganglionic level infarction (caudate, lentiform nuclei, internal  capsule, insula, M1-M3  cortex): 7 - Supraganglionic infarction (M4-M6 cortex): 3 Total score (0-10 with 10 being normal): 10 IMPRESSION: 1. No acute CT finding. Age related volume loss and chronic small-vessel ischemic changes of the white matter. 2. Aspects is 10. These results were communicated to Dr. Iver Nestle at 11:41 am on 08/17/2023 by text page via the [facility] messaging system. Electronically Signed   By: Paulina Fusi M.D.   On: 08/17/2023 11:43    Microbiology: Results for orders placed or performed during the hospital encounter of 04/11/22  SARS Coronavirus 2 by RT PCR (hospital order, performed in [facility] hospital lab) *cepheid single result test* Anterior Nasal Swab     Status: None   Collection Time: 04/11/22 10:43 PM   Specimen: Anterior Nasal Swab  Result Value Ref Range Status   SARS Coronavirus 2 by RT PCR NEGATIVE NEGATIVE Final    Comment: (NOTE) SARS-CoV-2 target nucleic acids are NOT DETECTED.  The SARS-CoV-2 RNA is generally detectable in upper and lower respiratory specimens during the acute phase of infection. The lowest concentration of SARS-CoV-2 viral copies this assay can detect is 250 copies / mL. A negative result does not preclude SARS-CoV-2 infection and should not be used as the sole basis for treatment or other patient management decisions.  A negative result may occur with improper specimen collection / handling, submission of specimen other than nasopharyngeal swab, presence of viral mutation(s) within the areas targeted by this assay, and inadequate number of viral copies (<250 copies / mL). A negative result must be combined with clinical observations, patient history, and epidemiological information.  Fact Sheet for Patients:   RoadLapTop.co.za  Fact Sheet for Healthcare Providers: http://kim-miller.com/  This test is not yet approved or  cleared by the [place] FDA and has been authorized for detection and/or diagnosis of  SARS-CoV-2 by FDA under an Emergency Use Authorization (EUA).  This EUA will remain in effect (meaning this test can be used) for the duration of the COVID-19 declaration under Section 564(b)(1) of the Act, 21 U.S.C. section 360bbb-3(b)(1), unless the authorization is terminated or revoked sooner.  Performed at [facility], [address], [place], [place] [address]   Urine Culture     Status: Abnormal   Collection Time: 04/11/22 10:43 PM   Specimen: Urine, Clean Catch  Result Value Ref Range Status   Specimen Description   Final    URINE, CLEAN CATCH Performed at [facility], [address], [place], [place] [address]    Special Requests   Final    NONE Performed at [facility], [address], [place], [place] [address]    Culture (A)  Final    <10,000 COLONIES/mL INSIGNIFICANT GROWTH Performed at [facility], [address] [place], [place] [address]    Report Status 04/13/2022 FINAL  Final    Labs: CBC: Recent Labs  Lab 08/17/23 1147  WBC 6.1  NEUTROABS 3.7  HGB 13.5  HCT 41.7  MCV 90.5  PLT 188   Basic Metabolic Panel: Recent Labs  Lab 08/17/23 1147  NA 137  K 3.5  CL 98  CO2 30  GLUCOSE 91  BUN 20  CREATININE 0.69  CALCIUM 8.7*   Liver Function Tests: Recent Labs  Lab 08/17/23 1147  AST 50*  ALT 25  ALKPHOS 77  BILITOT 0.8  PROT 6.0*  ALBUMIN 3.9   CBG: Recent Labs  Lab 08/17/23 1132  GLUCAP 84    Discharge time  spent: greater than 30 minutes.  Signed: Lurene Shadow, MD [facility] 08/18/2023

## 2023-08-18 NOTE — Progress Notes (Signed)
NEUROLOGY CONSULT FOLLOW UP NOTE   Date of service: August 18, 2023 Patient Name: Lindsey [name] MRN:  [id number] DOB:  [birth year]  Interval Hx/subjective  - endorses that she did have a headache - did not recall she had a previous similar (per neurology notes) episode in the past - Headache has resolved and she feels much improved, back to baseline  Vitals   Vitals:   08/17/23 1903 08/17/23 2233 08/18/23 0427 08/18/23 0741  BP: (!) 176/86  (!) 148/77 (!) 159/75  Pulse: 64  63 66  Resp: 17  18 18   Temp: 98.3 F (36.8 C)  98 F (36.7 C) 97.7 F (36.5 C)  TempSrc:      SpO2: 98%  96% 96%  Weight:      Height:  5\' 2"  (1.575 m)       Body mass index is 19.88 kg/m.  Physical Exam   Constitutional: Appears well-developed and well-nourished.  Psych: Affect appropriate to situation.  Eyes: No scleral injection.  Head: Normocephalic.  Respiratory: Effort normal, non-labored breathing.  GI: Soft.  No distension.   Neurologic Examination   Mental status: Awake, alert, fully oriented to person, place, time, situation, recalls details of her examination well yesterday.  No appreciable word finding difficulties.  Able to name and repeat. Cranial nerves: Visual fields full to confrontation, face symmetric, tongue midline, hard of hearing at baseline but intact to loud voice.  EOMI to tracking examiner Motor: Mild rest tremor in the right upper extremity noted.  No pronator drift.  Using bilateral upper extremities equally spontaneously Coordination: Intact finger-to-nose bilaterally   Labs and Diagnostic Imaging   CBC:  Recent Labs  Lab 08/17/23 1147  WBC 6.1  NEUTROABS 3.7  HGB 13.5  HCT 41.7  MCV 90.5  PLT 188    Basic Metabolic Panel:  Lab Results  Component Value Date   NA 137 08/17/2023   K 3.5 08/17/2023   CO2 30 08/17/2023   GLUCOSE 91 08/17/2023   BUN 20 08/17/2023   CREATININE 0.69 08/17/2023   CALCIUM 8.7 (L) 08/17/2023   GFRNONAA >60 08/17/2023    GFRAA >60 01/26/2020   Lipid Panel:  Lab Results  Component Value Date   LDLCALC 33 08/18/2023   HgbA1c:  Lab Results  Component Value Date   HGBA1C 5.7 (H) 08/17/2023   Urine Drug Screen:     Component Value Date/Time   LABOPIA NONE DETECTED 08/17/2023 1259   COCAINSCRNUR NONE DETECTED 08/17/2023 1259   LABBENZ NONE DETECTED 08/17/2023 1259   AMPHETMU NONE DETECTED 08/17/2023 1259   THCU NONE DETECTED 08/17/2023 1259   LABBARB NONE DETECTED 08/17/2023 1259    Alcohol Level     Component Value Date/Time   ETH <10 08/17/2023 1147   INR  Lab Results  Component Value Date   INR 0.9 08/17/2023   APTT  Lab Results  Component Value Date   APTT 26 08/17/2023   AED levels: No results found for: "PHENYTOIN", "ZONISAMIDE", "LAMOTRIGINE", "LEVETIRACETA"   MRI Brain(Personally reviewed): MRI brain radiology report pending, on my initial review  No acute stroke on diffusion Atrophy  1. No evidence of an acute intracranial abnormality. 2. Parenchymal atrophy, chronic small vessel ischemic disease and chronic infarcts as described. Of note, a chronic lacunar infarct within the right basal ganglia/genu of right internal capsule is new from the prior MRI of 01/03/2020. 3. Paranasal sinus disease as outlined. 4. Small-volume fluid within the right mastoid air cells.  MRA head: 1.  Mild to moderately motion degraded examination. 2. No proximal intracranial large vessel occlusion identified. 3. 1-2 mm inferiorly projecting vascular protrusion arising from the supraclinoid right internal carotid artery, which may reflect an aneurysm or infundibulum.   MRA neck: The common carotid, internal carotid and vertebral arteries are patent within the neck without stenosis.  rEEG:  Intermittent slowing  Impression   Overall favor this presentation to be a recurrent complex migraine headache given otherwise negative workup and rapid return to baseline with improvement of her  headache.  Patient will consider resuming her Cefaly device although she notes it was starting to cause of some irritating paresthesias for which reason she had discontinued it  She reports she has had the right upper extremity tremor for approximately 1 year; given chronicity of symptoms and minor symptoms this is appropriate for outpatient follow-up  Recommendations  -Close outpatient follow-up with Dr. Sherryll Burger for headache management, consider increase in preventative medication -Regarding her right upper extremity tremor, also deferred further outpatient workup and management for this issue -Consider detailed outpatient neurocognitive testing to better define her baseline word finding difficulties and potential underlying cognitive diagnosis if not already completed -Inpatient neurology will sign off at this time, please do not hesitate to reach out if additional questions or concerns arise -Discussed with primary via secure chat ______________________________________________________________________   Thank you for the opportunity to take part in the care of this patient. If you have any further questions, please contact the neurology consultation team on call. Updated oncall schedule is listed on AMION.  SignedBrooke Dare MD-PhD [facility] [phone]  Greater than 50 minutes spent in care of this patient today, majority of which was at bedside in reviewing workup, history and plan and answering patient questions

## 2023-08-18 NOTE — TOC CM/SW Note (Signed)
Transition of Care [facility]) - Inpatient Brief Assessment   Patient Details  Name: Lindsey [name] MRN: [id number] Date of Birth: 07/06/1936  Transition of Care [facility]) CM/SW Contact:    Allena Katz, LCSW Phone Number: 08/18/2023, 11:09 AM   Clinical Narrative:    Transition of Care Asessment: Insurance and Status: Insurance coverage has been reviewed Patient has primary care physician: Yes Home environment has been reviewed: from [facility] Prior level of function:: independent/modified independent Prior/Current Home Services: No current home services Social Determinants of Health Reivew: SDOH reviewed no interventions necessary Readmission risk has been reviewed: Yes Transition of care needs: no transition of care needs at this time

## 2023-08-18 NOTE — Evaluation (Signed)
Occupational Therapy Evaluation Patient Details Name: Lindsey [name] MRN: [id number] DOB: [birth year] Today's Date: 08/18/2023   History of Present Illness Lindsey [name] is a 87 y.o. female with medical history significant for chronic diastolic CHF,  CAD on Plavix, COPD, depression, chronic dizziness, bronchiectasis, PAD, migraine headaches with aura, labile hypertension, fibromyalgia, history of DVT after right knee surgery, s/pIVC filter, colitis, atrial tachycardia, anemia, carotid artery stenosis, osteoporosis, osteoarthritis. She presented to the hospital because of difficulty speaking and confusion. MRI brain did not show any evidence of acute stroke   Clinical Impression   Lindsey [name] was seen for OT evaluation this date. Prior to hospital admission, pt was MOD I using AD as needed. Pt lives at [facility]. Pt currently requires CGA for toilet t/f, assist for low toilet height and SBA hand washing standing sink side. Educated on falls safety, no skilled acute OT needs identified, will sign off. Upon hospital discharge, recommend no OT follow up.    If plan is discharge home, recommend the following: Help with stairs or ramp for entrance    Functional Status Assessment  Patient has not had a recent decline in their functional status  Equipment Recommendations  None recommended by OT    Recommendations for Other Services       Precautions / Restrictions Precautions Precautions: Fall Restrictions Weight Bearing Restrictions: No      Mobility Bed Mobility               General bed mobility comments: not tested    Transfers Overall transfer level: Needs assistance   Transfers: Sit to/from Stand Sit to Stand: Contact guard assist           General transfer comment: assist for low toilet height      Balance Overall balance assessment: Needs assistance Sitting-balance support: No upper extremity supported, Feet supported Sitting balance-Leahy Scale:  Normal     Standing balance support: No upper extremity supported, During functional activity Standing balance-Leahy Scale: Fair                             ADL either performed or assessed with clinical judgement   ADL Overall ADL's : Needs assistance/impaired                                       General ADL Comments: CGA for toilet t/f and SBA hand washing standing sink side.      Pertinent Vitals/Pain Pain Assessment Pain Assessment: No/denies pain     Extremity/Trunk Assessment Upper Extremity Assessment Upper Extremity Assessment: Overall WFL for tasks assessed   Lower Extremity Assessment Lower Extremity Assessment: Overall WFL for tasks assessed       Communication Communication Communication: No apparent difficulties   Cognition Arousal: Alert Behavior During Therapy: WFL for tasks assessed/performed Overall Cognitive Status: Within Functional Limits for tasks assessed                                                  Home Living Family/patient expects to be discharged to:: Other (Comment)  Additional Comments: [facility]      Prior Functioning/Environment Prior Level of Function : Independent/Modified Independent                        OT Problem List: Decreased activity tolerance         OT Goals(Current goals can be found in the care plan section) Acute Rehab OT Goals Patient Stated Goal: to go home OT Goal Formulation: With patient Time For Goal Achievement: 08/18/23 Potential to Achieve Goals: Good      Co-evaluation PT/OT/SLP Co-Evaluation/Treatment: Yes Reason for Co-Treatment: To address functional/ADL transfers PT goals addressed during session: Mobility/safety with mobility OT goals addressed during session: ADL's and self-care      AM-PAC OT "6 Clicks" Daily Activity     Outcome Measure Help from another person eating  meals?: None Help from another person taking care of personal grooming?: None Help from another person toileting, which includes using toliet, bedpan, or urinal?: A Little Help from another person bathing (including washing, rinsing, drying)?: A Little Help from another person to put on and taking off regular upper body clothing?: None Help from another person to put on and taking off regular lower body clothing?: None 6 Click Score: 22   End of Session    Activity Tolerance: Patient tolerated treatment well Patient left: in chair;with call bell/phone within reach;with chair alarm set;with nursing/sitter in room  OT Visit Diagnosis: Unsteadiness on feet (R26.81)                Time: 8119-1478 OT Time Calculation (min): 15 min Charges:  OT General Charges $OT Visit: 1 Visit OT Evaluation $OT Eval Low Complexity: 1 Low  Kathie Dike, M.S. OTR/L  08/18/23, 10:01 AM  ascom [phone]

## 2023-08-18 NOTE — Plan of Care (Signed)
  Problem: Education: Goal: Knowledge of General Education information will improve Description: Including pain rating scale, medication(s)/side effects and non-pharmacologic comfort measures Outcome: Progressing   Problem: Clinical Measurements: Goal: Ability to maintain clinical measurements within normal limits will improve Outcome: Progressing   Problem: Activity: Goal: Risk for activity intolerance will decrease Outcome: Progressing   Problem: Nutrition: Goal: Adequate nutrition will be maintained Outcome: Progressing   Problem: Coping: Goal: Level of anxiety will decrease Outcome: Progressing   Problem: Elimination: Goal: Will not experience complications related to bowel motility Outcome: Progressing   Problem: Pain Management: Goal: General experience of comfort will improve Outcome: Progressing   Problem: Safety: Goal: Ability to remain free from injury will improve Outcome: Progressing   Problem: Skin Integrity: Goal: Risk for impaired skin integrity will decrease Outcome: Progressing   Problem: Education: Goal: Knowledge of disease or condition will improve Outcome: Progressing   Problem: Ischemic Stroke/TIA Tissue Perfusion: Goal: Complications of ischemic stroke/TIA will be minimized Outcome: Progressing   Problem: Coping: Goal: Will verbalize positive feelings about self Outcome: Progressing Goal: Will identify appropriate support needs Outcome: Progressing   Problem: Health Behavior/Discharge Planning: Goal: Ability to manage health-related needs will improve Outcome: Progressing   Problem: Self-Care: Goal: Ability to participate in self-care as condition permits will improve Outcome: Progressing   Problem: Nutrition: Goal: Risk of aspiration will decrease Outcome: Progressing

## 2023-08-18 NOTE — Progress Notes (Signed)
*  PRELIMINARY RESULTS* Echocardiogram 2D Echocardiogram has been performed.  Lindsey [name] 08/18/2023, 12:56 PM

## 2023-08-18 NOTE — Progress Notes (Signed)
SLP Cancellation Note  Patient Details Name: Lindsey [name] MRN: [id number] DOB: May 31, 1936   Cancelled treatment:       Reason Eval/Treat Not Completed: SLP screened, no needs identified, will sign off (chart reviewed; consulted NSG and met w/ pt in room.)  Pt admitted w/ baseline of migraine headaches and recent dizziness w/ concern for intermittent difficulty w/ her speech(x2 episodes). Per chart notes, baseline of "Cognitive impairment: Treat vitamin B12 deficiency"  and is followed by Neurology outpatient. Noted pt recently had her physical w/ adjustment in her Lasix, encouraged to drink more fluids, and f/u w/ Cardiology.   Per MRI: "No evidence of an acute intracranial abnormality. 2. Parenchymal atrophy, chronic small vessel ischemic disease and chronic infarcts as described. Of note, a chronic lacunar infarct within the right basal ganglia/genu of right internal capsule is new from the prior MRI of 01/03/2020.".   Pt denied any difficulty swallowing and is currently on a regular diet; tolerates swallowing pills w/ water per NSG.  Pt conversed in full conversation w/out expressive/receptive deficits noted; pt denied any current speech-language deficits. Speech clear, intelligible. Pt was texting on her phone w/out difficulty per her report. No further skilled ST services indicated as pt appears at her communication baseline. Pt agreed. Recommend f/u w/ Neurology per MD recommendation for any further needs. NSG to reconsult if any change in status while admitted.      Jerilynn Som, MS, CCC-SLP Speech Language Pathologist Rehab Services; [facility] [phone] (ascom) Royelle Hinchman 08/18/2023, 11:25 AM

## 2023-08-19 LAB — URINE CULTURE: Culture: 60000 — AB

## 2023-08-23 ENCOUNTER — Emergency Department: Payer: Medicare Other

## 2023-08-23 ENCOUNTER — Encounter: Payer: Self-pay | Admitting: Emergency Medicine

## 2023-08-23 ENCOUNTER — Other Ambulatory Visit: Payer: Self-pay | Admitting: Cardiovascular Disease

## 2023-08-23 ENCOUNTER — Emergency Department
Admission: EM | Admit: 2023-08-23 | Discharge: 2023-08-23 | Disposition: A | Discharge status: Home / Self Care | Payer: Medicare Other | Attending: Emergency Medicine | Admitting: Emergency Medicine

## 2023-08-23 DIAGNOSIS — R079 Chest pain, unspecified: Secondary | ICD-10-CM | POA: Diagnosis present

## 2023-08-23 DIAGNOSIS — J449 Chronic obstructive pulmonary disease, unspecified: Secondary | ICD-10-CM | POA: Insufficient documentation

## 2023-08-23 LAB — CBC
HCT: 39.2 % (ref 36.0–46.0)
Hemoglobin: 12.8 g/dL (ref 12.0–15.0)
MCH: 29.8 pg (ref 26.0–34.0)
MCHC: 32.7 g/dL (ref 30.0–36.0)
MCV: 91.2 fL (ref 80.0–100.0)
Platelets: 184 10*3/uL (ref 150–400)
RBC: 4.3 MIL/uL (ref 3.87–5.11)
RDW: 14.3 % (ref 11.5–15.5)
WBC: 5 10*3/uL (ref 4.0–10.5)
nRBC: 0.4 % — ABNORMAL HIGH (ref 0.0–0.2)

## 2023-08-23 LAB — BASIC METABOLIC PANEL
Anion gap: 8 (ref 5–15)
BUN: 32 mg/dL — ABNORMAL HIGH (ref 8–23)
CO2: 29 mmol/L (ref 22–32)
Calcium: 8.8 mg/dL — ABNORMAL LOW (ref 8.9–10.3)
Chloride: 102 mmol/L (ref 98–111)
Creatinine, Ser: 0.51 mg/dL (ref 0.44–1.00)
GFR, Estimated: >60 mL/min (ref >60)
Glucose, Bld: 90 mg/dL (ref 70–99)
Potassium: 3.5 mmol/L (ref 3.5–5.1)
Sodium: 139 mmol/L (ref 135–145)

## 2023-08-23 LAB — TROPONIN I (HIGH SENSITIVITY)
Troponin I (High Sensitivity): 15 ng/L (ref <18)
Troponin I (High Sensitivity): 16 ng/L (ref <18)

## 2023-08-23 NOTE — ED Notes (Signed)
Pt ready to leave, pt calling her independent living facility for ride home.

## 2023-08-23 NOTE — ED Triage Notes (Signed)
Pt arrived via [facility] from home with report of intermittent chest pain and palpitations since attempting to lay down to sleep. Pt took 324mg  Aspirin prior to EMS arrival and received 12 spray of nitroglycerin in route with relief.    **22g IV Rt FA  BP 192/90 (EMS initial)

## 2023-08-23 NOTE — ED Notes (Signed)
Delay in d/c d/t other critical pts 

## 2023-08-23 NOTE — ED Provider Notes (Signed)
Care of this patient assumed from prior physician at 0700 pending repeat troponin and disposition. Please see prior physician note for further details.  Briefly this is an 87 year old female with history of CAD with prior MI, DVT, COPD, CHF presenting to the emergency department for evaluation of chest pain.  Received aspirin and nitro en route with EMS.  Labs here reassuring including negative initial troponin.  CXR without focal consolidation. EKG demonstrates sinus rhythm at a rate of 69, PR 200, QRS 162, QTc 501, bifascicular block present, similar to prior.  Signed out to me pending repeat troponin, likely discharge if normal.  Repeat troponin returned within normal limits.  Patient reevaluated.  Reports that her chest pain is resolved.  She is comfortable with discharge home.  She does have a cardiologist who she is comfortable following up with.  Strict return precautions were provided.  Patient was discharged stable condition.   Trinna Post, MD 08/23/23 [phone]

## 2023-08-23 NOTE — ED Provider Notes (Signed)
[facility] Provider Note    Event Date/Time   First MD Initiated Contact with Patient 08/23/23 [phone]     (approximate)   History   Chest Pain   HPI  Lindsey [name] is a 87 y.o. female   Past medical history of prior provoked DVT no longer on anticoagulation, COPD, fibromyalgia, here with chest pain that awoke her from sleep around 1:30 in the morning.  Sharp pain substernal nonradiating not associated with shortness of breath.    Has otherwise been in her regular state of health with no recent illnesses no respiratory infectious complaints and feels comfortable currently asymptomatic.    She denies GI or GU complaints.     External Medical Documents Reviewed: Discharge summary from hospitalization dated 08/18/2023 with complex migraine and stroke workup was negative      Physical Exam   Triage Vital Signs: ED Triage Vitals  Encounter Vitals Group     BP 08/23/23 0349 (!) 159/98     Systolic BP Percentile --      Diastolic BP Percentile --      Pulse Rate 08/23/23 0349 64     Resp 08/23/23 0349 11     Temp 08/23/23 0423 97.8 F (36.6 C)     Temp Source 08/23/23 0423 Oral     SpO2 08/23/23 0349 95 %     Weight --      Height --      Head Circumference --      Peak Flow --      Pain Score --      Pain Loc --      Pain Education --      Exclude from Growth Chart --     Most recent vital signs: Vitals:   08/23/23 0730 08/23/23 0830  BP: (!) 148/80 (!) 172/83  Pulse: 63 63  Resp: 16 19  Temp:    SpO2: 91% 97%    General: Awake, no distress.  CV:  Good peripheral perfusion.  Resp:  Normal effort.  Abd:  No distention.  Other:  Awake alert comfortable appearing with no acute distress laying in stretcher with clear lungs, soft nontender abdomen.  Her hemodynamics are reassuring with slight hypertension otherwise normal.   ED Results / Procedures / Treatments   Labs (all labs ordered are listed, but only abnormal results are  displayed) Labs Reviewed  BASIC METABOLIC PANEL - Abnormal; Notable for the following components:      Result Value   BUN 32 (*)    Calcium 8.8 (*)    All other components within normal limits  CBC - Abnormal; Notable for the following components:   nRBC 0.4 (*)    All other components within normal limits  TROPONIN I (HIGH SENSITIVITY)  TROPONIN I (HIGH SENSITIVITY)     I ordered and reviewed the above labs they are notable for cell counts electrolytes and initial troponin negative.  EKG  ED ECG REPORT I, Pilar Jarvis, the attending physician, personally viewed and interpreted this ECG.   Date: 08/23/2023  EKG Time: 0339  Rate: 69  Rhythm: sinus  Axis: nl  Intervals:bifasc block, uncahnged  ST&T Change: no stemi    RADIOLOGY I independently reviewed and interpreted chest x-ray and I see no obvious focal opacities or pneumothorax I also reviewed radiologist's formal read.   PROCEDURES:  Critical Care performed: No  Procedures   MEDICATIONS ORDERED IN ED: Medications - No data to display  IMPRESSION /  MDM / ASSESSMENT AND PLAN / ED COURSE  I reviewed the triage vital signs and the nursing notes.                                Patient's presentation is most consistent with acute presentation with potential threat to life or bodily function.  Differential diagnosis includes, but is not limited to, ACS, PE, dissection, infection   The patient is on the cardiac monitor to evaluate for evidence of arrhythmia and/or significant heart rate changes.  MDM:     Transient chest pain nonspecific with normal EKG, chest x-ray, initial troponin.  Completely asymptomatic now.  No infectious symptoms to suggest infection.  Will follow second troponin if normal and continues to be asymptomatic I think she can be discharged as cardiopulmonary emergency at this time would be unlikely.  I considered PE or dissection but the symptoms do not match.  Anticipate discharge following  second troponin if negative        FINAL CLINICAL IMPRESSION(S) / ED DIAGNOSES   Final diagnoses:  Nonspecific chest pain     Rx / DC Orders   ED Discharge Orders          Ordered    Ambulatory referral to Cardiology       Comments: If you have not heard from the Cardiology office within the next 72 hours please call [phone].   08/23/23 0739             Note:  This document was prepared using Dragon voice recognition software and may include unintentional dictation errors.    Pilar Jarvis, MD 08/23/23 [phone]

## 2023-08-23 NOTE — Discharge Instructions (Signed)
You were seen in the Emergency Department today for evaluation of your chest pain. Fortunately, your labs, EKG, and chest x-Aprile [name] were overall reassuring against a emergency cause for your pain.  Please arrange close follow-up with your cardiologist for further evaluation.  Return to the ER for new or worsening symptoms.

## 2023-08-23 NOTE — ED Notes (Signed)
Pt d/c'd, off monitor, up to b/r.

## 2023-08-23 NOTE — Progress Notes (Unsigned)
Cardiology Clinic Note   Date: 08/24/2023 ID: [name], [name] January 26, 1936, MRN [id number]  Primary Cardiologist:  Julien Nordmann, MD  Patient Profile    Lindsey [name] is a 87 y.o. female who presents to the clinic today for hospital follow up.     Past medical history significant for: CAD. R/LHC 03/03/2022: Severe single-vessel CAD with occlusion of mid LCx with left to left and right to left collaterals.  Otherwise nonobstructive CAD involving LAD and RCA.  Moderately reduced LVEF with mid/apical inferior akinesis.  EF 35 to 45%.  Mildly elevated left heart filling pressure.  Normal Fick cardiac output/index.  Recommend medical therapy and aggressive secondary prevention. Chronic combined systolic and diastolic heart failure. Echo 08/18/2023: EF 50 to 55%.  No RWMA.  Moderate LVH.  Grade I DD.  Low normal RV function.  Normal PA pressure.  Mild MR.  Mild to moderate TR.  Mild AI.  Aortic valve sclerosis without stenosis. Palpitations/PACs. 3-day Holter 11/05/2021: Average HR 55 bpm.  5 runs of SVT/atrial tachycardia, fastest/longest 10 beats max rate 143 bpm, average 136 bpm.  Frequent PACs (12.9%).  Patient triggered events associated with PACs. Carotid artery stenosis. Carotid artery duplex 03/31/2023: Right ICA 1 to 39%.  Left ICA with no evidence of stenosis. PAD. Hypertension. Hyperlipidemia. Lipid panel 08/18/2023: LDL 33, HDL 110, TG 45, total 152. Right DVT. S/p IVC filter. COPD. Migraines. TIA.  In summary, patient was admitted to [facility] on 03/01/2022 for acute on chronic heart failure and NSTEMI.  High-sensitivity troponin peaked at 1044, BNP 1740.  Chest x-ray demonstrated cardiomegaly with mild diffuse interstitial opacity and small pleural effusions consistent with edema.  EKG showed PACs, LAFB, chronic RBBB, T wave inversions in inferior lateral leads.  She underwent R/LHC which showed severe single-vessel CAD with complete occlusion of mid LCx with left to left and  right to left collaterals (further details above).  Recommendation for medical management with DAPT and escalation of GDMT.  Echo showed EF 40 to 45%, RWMA, Grade III DD.  She was diuresed and discharged on 03/04/2022.  She underwent admission at [facility] on 04/11/2022 for shortness of breath and hypoxia.  Patient reported DOE and lower extremity edema as well as cough with peak flow.  In ED patient's BP was elevated and SpO2 80% on room air prior to starting 2 L of O2 therapy to maintain SpO2 in the 90s.  Troponin 30, BNP 985.  AST/ALT 95/80.  Urinalysis with large leukocytes.  EKG without ST or T wave changes.  Chest x-ray showed cardiomegaly with vascular congestion and diffuse bilateral interstitial groundglass opacities suspicious for pulmonary edema.  She underwent IV diuresis and symptomatically improved.  She was transitioned to oral Lasix on discharge.  She was continued on losartan and bisoprolol with recommendation for daily weights and low-sodium diet and fluid restriction.  She was discharged on 04/14/2022.  Patient presented to [facility] ED on 05/07/2022 with complaints of chest pain.  Chest wall was tender to palpation and no pain at rest.  No shortness of breath.  She was noted to be hypertensive with BP 175/106.  Troponin 22>> 24 not consistent with ACS.  EKG showed no evidence of ischemia.  Chest x-ray was clear.  She was discharged home to follow-up with cardiology as an outpatient.  She was seen in the office on 02/11/2023 by Charlsie Quest, NP for complaints of shortness of breath and dizziness.  She had an episode of chest discomfort while walking up the  stairs the evening prior after eating a meal higher in sodium than usual with pain in the epigastrium.  She reported she had been off of losartan for a minimum a few weeks.  Labs drawn for further evaluation.  D-dimer slightly elevated at 0.63, troponin 14, BNP normal for age, stable electrolytes and kidney function.  CTA chest PE protocol was ordered for  further evaluation of elevated D-dimer.  CTA demonstrated no evidence of PE and no acute abnormality in the chest.     History of Present Illness    [name] Lindsey [name] is followed by Dr. Mariah Milling for the above outlined history.  Patient was last seen in the office by Dr. Mariah Milling on 04/01/2023 for follow-up after testing.  She was doing well at that time with well-controlled BP and euvolemic.  No medication changes were made.  Patient underwent hospital admission from 08/17/2023 to 08/18/2023 for altered mental status.  Patient had acute onset of difficulty speaking and confusion with symptoms resolving by the time EMS arrived.  Symptoms recurred en route to ED.  No unilateral weakness or numbness to extremities, changes in vision, headache prior to admission, vomiting, difficulty walking.  No recent falls reported.  CT head and MRI brain did not show evidence of acute stroke.  No large vessel occlusion on MRA head and neck.  Neurology recommended aspirin and Plavix.  She was instructed to follow-up with neurology and PCP as an outpatient.  Patient presented to the ED via EMS on 08/23/2023 for chest pain and palpitations.  EKG without ischemic changes.  Initial lab: Sodium 139, potassium 3.5, creatinine 0.51, BUN 32, calcium 8.8, hemoglobin 12.8.  Troponin 15>> 16.  Chest x-ray with with no active disease.  Patient was discharged and instructed to follow-up with cardiology as an outpatient.   Discussed the use of AI scribe software for clinical note transcription with the patient, who gave verbal consent to proceed.  The patient presents with a friend for hospital follow-up. Her biggest complaint today is persistent positional dizziness and presyncope. She reports this has been going on for years and nobody has been able to figure out why. She reports dizziness occurs with sit to stand and it takes her several minutes of rest to subside. Episodes occur at least once a day but can happen several times a day.  Her friend states she will sometimes get dizzy after standing for a period of time. Patient reports occasional shortness of breath with dizziness. She otherwise does not experience dyspnea unless she does heavy exertion. She has been holding bisoprolol for 4 months to see if dizziness would improve but it has not.  Patient denies further chest pain since ED visit. She reports since her catheterization she has had rare episodes of very brief chest pain that lasts a second and resolves on its own. When she went to the ED the pain lasted the longest it ever had and resolved with NTG. Patient reports stable weight and denies lower extremity edema. She started taking Lasix every other day and has not noticed increased edema. Patient is traveling to [facility] for Thanksgiving and is inquires if she is safe to go. Her friend is concerned that she will be traveling by herself. Patient reports she plans to get a wheelchair to get through the airports. She will also bring her cane for ambulation. She is going to see her granddaughter who is an NP.         ROS: All other systems reviewed and are otherwise  negative except as noted in History of Present Illness.  Studies Reviewed    EKG Interpretation Date/Time:  Tuesday August 24 2023 14:45:47 EST Ventricular Rate:  73 PR Interval:  196 QRS Duration:  162 QT Interval:  438 QTC Calculation: 482 R Axis:   -74  Text Interpretation: Normal sinus rhythm Right bundle branch block Left anterior fascicular block Bifascicular block Minimal voltage criteria for LVH, may be normal variant ( R in aVL ) Septal infarct , age undetermined When compared with ECG of 23-Aug-2023 03:39, No significant change was found Confirmed by Carlos Levering [phone]) on 08/24/2023 2:50:58 PM           Physical Exam    VS:  BP (!) 122/58 (BP Location: Left Arm, Patient Position: Sitting, Cuff Size: Normal)   Pulse 73   Ht 5\' 2"  (1.575 m)   Wt 124 lb (56.2 kg)   SpO2 97%   BMI  22.68 kg/m  , BMI Body mass index is 22.68 kg/m.  Orthostatic VS for the past 24 hrs (Last 3 readings):  BP- Lying Pulse- Lying BP- Sitting Pulse- Sitting BP- Standing at 0 minutes Pulse- Standing at 0 minutes BP- Standing at 3 minutes Pulse- Standing at 3 minutes  08/24/23 1538 149/85 67 137/85 71 119/76 80 (!) 149/98 86     GEN: Well nourished, well developed, in no acute distress. Neck: No JVD or carotid bruits. Cardiac:  RRR. No murmurs. No rubs or gallops.   Respiratory:  Respirations regular and unlabored. Clear to auscultation without rales, wheezing or rhonchi. GI: Soft, nontender, nondistended. Extremities: Radials/DP/PT 2+ and equal bilaterally. No clubbing or cyanosis. No edema.  Skin: Warm and dry, no rash. Neuro: Strength intact.  Assessment & Plan       CAD/chest pain [facility] May 2023 showed severe single-vessel CAD with occlusion of mid LCx with left to left and right to left collaterals otherwise nonobstructive CAD involving LAD and RCA.  Patient presented to the ED on 08/23/2023 with chest pain and palpitations.  Unremarkable workup.  Patient denies further chest pain. She repots since 2023 she has had rare very brief episodes of chest pain that last a second and resolve on its own.  -Continue aspirin, Plavix, Repatha, Zetia. Patient has been holding bisoprolol for 4 months secondary to dizziness. Will continue to hold. -Prescribed prn SL NTG per patient's request.   Chronic combined systolic and diastolic heart failure Echo November 2024 showed EF 50 to 55%, no RWMA, moderate LVH, Grade I DD, mild MR/AI, mild to moderate TR.  Patient denies lower extremity edema. Weight is stable.  Euvolemic and well compensated on exam. Patient holding bisoprolol. She has not been on Losartan for at least 2 years.  -Decrease Lasix to 20 mg every other day. -Monitor weight daily and take Lasix 40 mg as needed for weight gain of 2 pounds overnight or 5 pounds in a  week.  Palpitations/PACs 3-day Holter February 2023 showed 5 runs of SVT/A. tach, frequent PACs (12.9%).  Patient denies palpitations. She has been holding bisoprolol secondary to dizziness. She has not developed palpitations in the 4 months she has been off of it. RRR on exam today.  -Continue to hold bisoprolol for now.   Hypertension BP today 122/58.  -Continue Cardura.  Hyperlipidemia LDL November 2024 33, at goal. Patient is concerned she is not administering Repatha correctly, as she feels she develops a bump on her leg after administering it.  -Refer to pharm D lipid clinic to  discuss administration. She is aware this appointment will be in [place].  -Continue Repatha, Zetia.  Orthostatic Hypotension/Dizziness Dizziness upon standing and during ambulation, more frequent recently. BP drop of 18 mmHg going from sit to stand with mild dizziness. Patient is already holding bisoprolol x 4 months.  -Decrease Lasix to 20mg  every other day. -Encourage slower position changes and use of support when resting. -Consider use of compression socks if tolerated.        Disposition: Return in 3 months or sooner as needed. Consider resumption of bisoprolol at that time.          Signed, Etta Grandchild. Rakwon Letourneau, DNP, NP-C

## 2023-08-24 ENCOUNTER — Encounter: Payer: Self-pay | Admitting: Student

## 2023-08-24 ENCOUNTER — Ambulatory Visit: Payer: Medicare Other | Attending: Student | Admitting: Student

## 2023-08-24 VITALS — BP 122/58 | HR 73 | Ht 62.0 in | Wt 124.0 lb

## 2023-08-24 DIAGNOSIS — R42 Dizziness and giddiness: Secondary | ICD-10-CM

## 2023-08-24 DIAGNOSIS — I5042 Chronic combined systolic (congestive) and diastolic (congestive) heart failure: Secondary | ICD-10-CM | POA: Diagnosis not present

## 2023-08-24 DIAGNOSIS — R002 Palpitations: Secondary | ICD-10-CM | POA: Diagnosis not present

## 2023-08-24 DIAGNOSIS — I10 Essential (primary) hypertension: Secondary | ICD-10-CM

## 2023-08-24 DIAGNOSIS — E785 Hyperlipidemia, unspecified: Secondary | ICD-10-CM

## 2023-08-24 DIAGNOSIS — I491 Atrial premature depolarization: Secondary | ICD-10-CM

## 2023-08-24 DIAGNOSIS — I951 Orthostatic hypotension: Secondary | ICD-10-CM

## 2023-08-24 DIAGNOSIS — I25810 Atherosclerosis of coronary artery bypass graft(s) without angina pectoris: Secondary | ICD-10-CM | POA: Diagnosis not present

## 2023-08-24 MED ORDER — FUROSEMIDE 40 MG PO TABS: 40.0000 mg | ORAL_TABLET | ORAL | 0 refills | Status: DC | PRN

## 2023-08-24 MED ORDER — FUROSEMIDE 20 MG PO TABS: 20.0000 mg | ORAL_TABLET | ORAL | 3 refills | Status: DC

## 2023-08-24 MED ORDER — NITROGLYCERIN 0.4 MG/SPRAY TL SOLN
1.0000 | 12 refills | Status: AC | PRN
Start: 2023-08-24 — End: ?

## 2023-08-24 NOTE — Patient Instructions (Addendum)
Medication Instructions:  Your physician has recommended you make the following change in your medication:  nitroGLYCERIN (NITROLINGUAL) 0.4 MG/SPRAY - Place 1 spray under the tongue every 5 (five) minutes x 3 doses as needed for chest pain  furosemide (LASIX) 40 MG tablet - Take 1 tablet (40 mg total) by mouth as needed (for weight gain of 3 lbs in a day or 5 lbs in a week)  furosemide (LASIX) 20 MG tablet - Take 1 tablet (20 mg total) by mouth every other day  *If you need a refill on your cardiac medications before your next appointment, please call your pharmacy*  Lab Work: - None ordered  Testing/Procedures: - None ordered  Follow-Up: At [facility], you and your health needs are our priority.  As part of our continuing mission to provide you with exceptional heart care, we have created designated Provider Care Teams.  These Care Teams include your primary Cardiologist (physician) and Advanced Practice Providers (APPs -  Physician Assistants and Nurse Practitioners) who all work together to provide you with the care you need, when you need it.  Your next appointment:   3 month(s)  Provider:   Carlos Levering, NP    Other Instructions - AMB Referral to [facility]

## 2023-09-10 ENCOUNTER — Inpatient Hospital Stay: Payer: Medicare Other | Attending: Oncology | Admitting: Oncology

## 2023-09-10 ENCOUNTER — Encounter: Payer: Self-pay | Admitting: Oncology

## 2023-09-10 ENCOUNTER — Inpatient Hospital Stay: Payer: Medicare Other

## 2023-09-10 VITALS — BP 138/79 | HR 55 | Temp 97.3°F | Resp 18 | Wt 128.9 lb

## 2023-09-10 DIAGNOSIS — M1990 Unspecified osteoarthritis, unspecified site: Secondary | ICD-10-CM | POA: Insufficient documentation

## 2023-09-10 DIAGNOSIS — R4189 Other symptoms and signs involving cognitive functions and awareness: Secondary | ICD-10-CM | POA: Diagnosis not present

## 2023-09-10 DIAGNOSIS — Z86718 Personal history of other venous thrombosis and embolism: Secondary | ICD-10-CM | POA: Diagnosis not present

## 2023-09-10 DIAGNOSIS — I509 Heart failure, unspecified: Secondary | ICD-10-CM | POA: Diagnosis not present

## 2023-09-10 DIAGNOSIS — Z79899 Other long term (current) drug therapy: Secondary | ICD-10-CM | POA: Diagnosis not present

## 2023-09-10 DIAGNOSIS — D472 Monoclonal gammopathy: Secondary | ICD-10-CM | POA: Diagnosis not present

## 2023-09-10 DIAGNOSIS — I110 Hypertensive heart disease with heart failure: Secondary | ICD-10-CM | POA: Diagnosis not present

## 2023-09-10 DIAGNOSIS — E538 Deficiency of other specified B group vitamins: Secondary | ICD-10-CM | POA: Diagnosis not present

## 2023-09-10 DIAGNOSIS — D72810 Lymphocytopenia: Secondary | ICD-10-CM | POA: Diagnosis not present

## 2023-09-10 LAB — CBC WITH DIFFERENTIAL/PLATELET
Abs Immature Granulocytes: 0.03 10*3/uL (ref 0.00–0.07)
Basophils Absolute: 0 10*3/uL (ref 0.0–0.1)
Basophils Relative: 0 %
Eosinophils Absolute: 0.3 10*3/uL (ref 0.0–0.5)
Eosinophils Relative: 5 %
HCT: 40.4 % (ref 36.0–46.0)
Hemoglobin: 13.2 g/dL (ref 12.0–15.0)
Immature Granulocytes: 0 %
Lymphocytes Relative: 8 %
Lymphs Abs: 0.6 10*3/uL — ABNORMAL LOW (ref 0.7–4.0)
MCH: 29.7 pg (ref 26.0–34.0)
MCHC: 32.7 g/dL (ref 30.0–36.0)
MCV: 90.8 fL (ref 80.0–100.0)
Monocytes Absolute: 0.6 10*3/uL (ref 0.1–1.0)
Monocytes Relative: 8 %
Neutro Abs: 5.6 10*3/uL (ref 1.7–7.7)
Neutrophils Relative %: 79 %
Platelets: 214 10*3/uL (ref 150–400)
RBC: 4.45 MIL/uL (ref 3.87–5.11)
RDW: 14.5 % (ref 11.5–15.5)
WBC: 7.2 10*3/uL (ref 4.0–10.5)
nRBC: 0 % (ref 0.0–0.2)

## 2023-09-10 NOTE — Progress Notes (Signed)
Pt here to establish care. Pt reports that she has been having foul urine order.

## 2023-09-10 NOTE — Assessment & Plan Note (Signed)
Possibly due to B12 deficiency.

## 2023-09-10 NOTE — Assessment & Plan Note (Signed)
-  Continue B12 supplementation °

## 2023-09-10 NOTE — Assessment & Plan Note (Signed)
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 Less frequently, these patients progress to AL amyloidosis, light chain deposition disease, or another lymphoproliferative disorder. Check cbc myeloma panel, light chain ratio, beta 2 microglobulin, 24 UPEP.  No anemia, hypercalcemia, kidney function impairment.

## 2023-09-10 NOTE — Progress Notes (Signed)
Hematology/Oncology Consult note [facility] [phone] [phone]   Patient Care Team: Barbette Reichmann, MD as PCP - General (Internal Medicine) Antonieta Iba, MD as PCP - Cardiology (Cardiology) Rickard Patience, MD as Consulting Physician (Oncology)  REFERRING PROVIDER: Janice Coffin, PA-C  CHIEF COMPLAINTS/REASON FOR VISIT:  Evaluation of abnormal SPEP  ASSESSMENT & PLAN:   MGUS (monoclonal gammopathy of unknown significance) I discussed with patient about the diagnosis of IgG lamda MGUS which is an asymptomatic condition which has a small risk of progression to smoldering multiple myeloma and to symptomatic multiple myeloma. Less frequently, these patients progress to AL amyloidosis, light chain deposition disease, or another lymphoproliferative disorder. Check cbc myeloma panel, light chain ratio, beta 2 microglobulin, 24 UPEP.  No anemia, hypercalcemia, kidney function impairment.    Lymphocytopenia Possibly due to B12 deficiency.   B12 deficiency Continue B12 supplementation.    Orders Placed This Encounter  Procedures   Multiple Myeloma Panel (SPEP&IFE w/QIG)    Standing Status:   Future    Number of Occurrences:   1    Standing Expiration Date:   09/09/2024   Kappa/lambda light chains    Standing Status:   Future    Number of Occurrences:   1    Standing Expiration Date:   09/09/2024   CBC with Differential/Platelet    Standing Status:   Future    Number of Occurrences:   1    Standing Expiration Date:   09/09/2024   IFE+PROTEIN ELECTRO, 24-HR UR    Standing Status:   Future    Standing Expiration Date:   09/09/2024   Miscellaneous [facility] test (send-out)    Standing Status:   Future    Number of Occurrences:   1    Standing Expiration Date:   09/09/2024    Order Specific Question:   Test name / description:    Answer:   NT-proBNP [facility] 143000   Follow up in a few weeks to review work up results.  All questions  were answered. The patient knows to call the clinic with any problems, questions or concerns.  Rickard Patience, MD, PhD [facility] Hematology Oncology 09/10/2023    HISTORY OF PRESENTING ILLNESS:  Lindsey [name] is a 87 y.o. female who was seen in consultation at the request of Janice Coffin, PA-C for evaluation of abnormal SPEP results.   08/16/2023 SPEP showed M spike 0.2g/dL,   and IFE showed IgG monoclonal protein with Lamda light chain specificity  She denies history of abnormal bone pain or bone fracture. Patient denies history of recurrent infection.. Denies chills, night sweats, anorexia or abnormal weight loss.  She has low B12 level at 298, she has been advised to start B12 supplementation.  Cognitive Impairment with word finding difficulty she follows up with neurology   MEDICAL HISTORY:  Past Medical History:  Diagnosis Date   Anemia    Atrial tachycardia (HCC)    Breast cancer (HCC)    Bronchiectasis (HCC) 06/2007   CHF (congestive heart failure) (HCC)    Chronic Dizziness    Colitis    COPD (chronic obstructive pulmonary disease) (HCC)    Depression    Diastolic dysfunction    a. 12/2019 Echo: EF 60-65%, no rwma, GrII DD. Nl RV size/fxn. Mild MR/AI.   DVT (deep venous thrombosis) (HCC)    after her right knee surgery.    Fibromyalgia    Labile Hypertension    Migraine headache with aura  Osteoarthritis    Osteoporosis    PAD (peripheral artery disease) (HCC)    Pneumonia    [place] 1952   Stenosis of carotid artery     SURGICAL HISTORY: Past Surgical History:  Procedure Laterality Date   ABDOMINAL HYSTERECTOMY     BACK SURGERY     BILATERAL TOTAL MASTECTOMY WITH AXILLARY LYMPH NODE DISSECTION     BREAST SURGERY     CARPAL TUNNEL RELEASE     bilateral    COLONOSCOPY WITH PROPOFOL N/A 08/02/2015   Procedure: COLONOSCOPY WITH PROPOFOL;  Surgeon: Scot Jun, MD;  Location: [facility] ENDOSCOPY;  Service: Endoscopy;  Laterality: N/A;   FINGER TENDON  REPAIR     HERNIA REPAIR     REPLACEMENT TOTAL KNEE     right knee    RIGHT/LEFT HEART CATH AND CORONARY ANGIOGRAPHY N/A 03/03/2022   Procedure: RIGHT/LEFT HEART CATH AND CORONARY ANGIOGRAPHY;  Surgeon: Yvonne Kendall, MD;  Location: [facility] INVASIVE CV LAB;  Service: Cardiovascular;  Laterality: N/A;   SQUAMOUS CELL CARCINOMA EXCISION     TONSILLECTOMY     TOTAL KNEE ARTHROPLASTY Left 09/19/2020   Procedure: TOTAL KNEE ARTHROPLASTY;  Surgeon: Kennedy Bucker, MD;  Location: [facility] ORS;  Service: Orthopedics;  Laterality: Left;   UMBILICAL HERNIA REPAIR     VAGINAL HYSTERECTOMY      SOCIAL HISTORY: Social History   Socioeconomic History   Marital status: Widowed    Spouse name: Not on file   Number of children: Not on file   Years of education: Not on file   Highest education level: Not on file  Occupational History   Not on file  Tobacco Use   Smoking status: Never   Smokeless tobacco: Never  Vaping Use   Vaping status: Never Used  Substance and Sexual Activity   Alcohol use: Yes    Alcohol/week: 4.0 standard drinks of alcohol    Types: 4 Glasses of wine per week    Comment: a glass of wine every other day   Drug use: No   Sexual activity: Not on file  Other Topics Concern   Not on file  Social History Narrative   Not on file   Social Determinants of Health   Financial Resource Strain: Low Risk  (07/15/2023)   Received from [facility]   Overall Financial Resource Strain (CARDIA)    Difficulty of Paying Living Expenses: Not hard at all  Food Insecurity: No Food Insecurity (08/17/2023)   Hunger Vital Sign    Worried About Running Out of Food in the Last Year: Never true    Ran Out of Food in the Last Year: Never true  Transportation Needs: No Transportation Needs (08/17/2023)   PRAPARE - Administrator, Civil Service (Medical): No    Lack of Transportation (Non-Medical): No  Physical Activity: Not on file  Stress: Not on file  Social  Connections: Not on file  Intimate Partner Violence: Not At Risk (08/17/2023)   Humiliation, Afraid, Rape, and Kick questionnaire    Fear of Current or Ex-Partner: No    Emotionally Abused: No    Physically Abused: No    Sexually Abused: No    FAMILY HISTORY: Family History  Problem Relation Age of Onset   Pancreatic cancer Mother    Arthritis Mother    Ovarian cancer Mother    Arthritis Father    Bladder Cancer Father    Atrial fibrillation Sister    Endometrial cancer Sister  Colon cancer Sister     ALLERGIES:  is allergic to ivp dye [iodinated contrast media], charentais melon (french melon), codeine, cucumber extract, demeclocycline, galcanezumab-gnlm, sulfa antibiotics, tegaderm ag mesh [silver], telmisartan, tetracyclines & related, and wild lettuce extract (lactuca virosa).  MEDICATIONS:  Current Outpatient Medications  Medication Sig Dispense Refill   acetaminophen (TYLENOL) 500 MG tablet Take 500 mg by mouth at bedtime as needed for moderate pain.     albuterol (VENTOLIN HFA) 108 (90 Base) MCG/ACT inhaler Inhale 2 puffs into the lungs every 6 (six) hours as needed for wheezing or shortness of breath.     ARIPiprazole (ABILIFY) 2 MG tablet Take 2 mg by mouth daily.      aspirin EC 81 MG tablet Take 1 tablet (81 mg total) by mouth daily. Swallow whole.     bisoprolol (ZEBETA) 5 MG tablet TAKE 1 TABLET BY MOUTH DAILY 90 tablet 0   clopidogrel (PLAVIX) 75 MG tablet TAKE ONE TABLET EACH MORNING WITH BREAKFAST 90 tablet 3   cyanocobalamin (VITAMIN B12) 1000 MCG tablet Take 1 tablet (1,000 mcg total) by mouth daily.     cyclobenzaprine (FLEXERIL) 5 MG tablet Take 5 mg by mouth at bedtime as needed.     doxazosin (CARDURA) 2 MG tablet Take 1 tablet (2 mg total) by mouth daily. 90 tablet 2   Evolocumab (REPATHA SURECLICK) 140 MG/ML SOAJ Inject 140 mg into the skin every 14 (fourteen) days. 2 mL 12   ezetimibe (ZETIA) 10 MG tablet TAKE 1 TABLET BY MOUTH DAILY. 90 tablet 2    furosemide (LASIX) 40 MG tablet Take 1 tablet (40 mg total) by mouth as needed (for weight gain of 3 lbs in a day or 5 lbs in a week). 30 tablet 0   hydrOXYzine (ATARAX/VISTARIL) 25 MG tablet Take 25 mg by mouth as needed for anxiety.     losartan (COZAAR) 25 MG tablet Take 1 tablet (25 mg total) by mouth daily. 90 tablet 1   Multiple Vitamins-Minerals (PRESERVISION AREDS 2 PO) Take by mouth.     Omega-3 Fatty Acids (FISH OIL OMEGA-3 PO) Take 2 capsules by mouth at bedtime.     potassium chloride (KLOR-CON M) 10 MEQ tablet TAKE 1 TABLET BY MOUTH DAILY 90 tablet 3   SYMBICORT 160-4.5 MCG/ACT inhaler SMARTSIG:2 Inhalation Via Inhaler Twice Daily     venlafaxine XR (EFFEXOR-XR) 150 MG 24 hr capsule Take 150 mg by mouth daily.     furosemide (LASIX) 20 MG tablet Take 1 tablet (20 mg total) by mouth every other day. (Patient not taking: Reported on 09/10/2023) 45 tablet 3   nitroGLYCERIN (NITROLINGUAL) 0.4 MG/SPRAY spray Place 1 spray under the tongue every 5 (five) minutes x 3 doses as needed for chest pain. (Patient not taking: Reported on 09/10/2023) 12 g 12   polyethylene glycol (MIRALAX / GLYCOLAX) packet Take 17 g by mouth daily as needed for mild constipation or moderate constipation.  (Patient not taking: Reported on 09/10/2023)     predniSONE (DELTASONE) 50 MG tablet Take one tablet 13 hours, 7 hours, and 1 hour prior to scan. (Patient not taking: Reported on 09/10/2023) 3 tablet 0   sodium chloride HYPERTONIC 3 % nebulizer solution Take 5 mLs by nebulization every 4 (four) hours as needed. (Patient not taking: Reported on 09/10/2023)     No current facility-administered medications for this visit.    Review of Systems  Constitutional:  Negative for appetite change, chills, fatigue and fever.  HENT:   Negative  for hearing loss and voice change.   Eyes:  Negative for eye problems.  Respiratory:  Negative for chest tightness and cough.   Cardiovascular:  Negative for chest pain.   Gastrointestinal:  Negative for abdominal distention, abdominal pain and blood in stool.  Endocrine: Negative for hot flashes.  Genitourinary:  Negative for difficulty urinating and frequency.   Musculoskeletal:  Negative for arthralgias.  Skin:  Negative for itching and rash.  Neurological:  Negative for extremity weakness.       Cognitive Impairment with word finding difficulty  Hematological:  Negative for adenopathy.  Psychiatric/Behavioral:  Negative for confusion.    PHYSICAL EXAMINATION: ECOG PERFORMANCE STATUS: 1 - Symptomatic but completely ambulatory Vitals:   09/10/23 1157  BP: 138/79  Pulse: (!) 55  Resp: 18  Temp: (!) 97.3 F (36.3 C)   Filed Weights   09/10/23 1157  Weight: 128 lb 14.4 oz (58.5 kg)    Physical Exam Constitutional:      General: She is not in acute distress. HENT:     Head: Normocephalic and atraumatic.  Eyes:     General: No scleral icterus. Cardiovascular:     Rate and Rhythm: Normal rate and regular rhythm.     Heart sounds: Normal heart sounds.  Pulmonary:     Effort: Pulmonary effort is normal. No respiratory distress.     Breath sounds: No wheezing.  Abdominal:     General: Bowel sounds are normal. There is no distension.     Palpations: Abdomen is soft.  Musculoskeletal:        General: No deformity. Normal range of motion.     Cervical back: Normal range of motion and neck supple.  Skin:    General: Skin is warm and dry.     Findings: No erythema or rash.  Neurological:     Mental Status: She is alert and oriented to person, place, and time. Mental status is at baseline.     Cranial Nerves: No cranial nerve deficit.     Coordination: Coordination normal.  Psychiatric:        Mood and Affect: Mood normal.      LABORATORY DATA:  I have reviewed the data as listed    Latest Ref Rng & Units 09/10/2023   12:38 PM 08/23/2023    3:53 AM 08/17/2023   11:47 AM  CBC  WBC 4.0 - 10.5 K/uL 7.2  5.0  6.1   Hemoglobin 12.0 -  15.0 g/dL 29.5  28.4  13.2   Hematocrit 36.0 - 46.0 % 40.4  39.2  41.7   Platelets 150 - 400 K/uL 214  184  188       Latest Ref Rng & Units 08/23/2023    3:53 AM 08/17/2023   11:47 AM 02/11/2023    4:39 PM  CMP  Glucose 70 - 99 mg/dL 90  91  99   BUN 8 - 23 mg/dL 32  20  18   Creatinine 0.44 - 1.00 mg/dL 4.40  1.02  7.25   Sodium 135 - 145 mmol/L 139  137  136   Potassium 3.5 - 5.1 mmol/L 3.5  3.5  3.9   Chloride 98 - 111 mmol/L 102  98  96   CO2 22 - 32 mmol/L 29  30  31    Calcium 8.9 - 10.3 mg/dL 8.8  8.7  9.1   Total Protein 6.5 - 8.1 g/dL  6.0    Total Bilirubin <1.2 mg/dL  0.8  Alkaline Phos 38 - 126 U/L  77    AST 15 - 41 U/L  50    ALT 0 - 44 U/L  25              RADIOGRAPHIC STUDIES: I have personally reviewed the radiological images as listed and agreed with the findings in the report.  DG Chest Port 1 View  Result Date: 08/23/2023 CLINICAL DATA:  Chest pain and palpitations EXAM: PORTABLE CHEST 1 VIEW COMPARISON:  05/07/2022 FINDINGS: Cardiac shadow is stable. Aortic calcifications are seen. Lungs are well aerated bilaterally. No focal infiltrate or effusion is noted. No bony abnormality is seen. IMPRESSION: No active disease. Electronically Signed   By: Alcide Clever M.D.   On: 08/23/2023 04:05   ECHOCARDIOGRAM COMPLETE  Result Date: 08/18/2023    ECHOCARDIOGRAM REPORT   Patient Name:   Lindsey [name] Date of Exam: 08/18/2023 Medical Rec #:  [id number]        Height:       62.0 in Accession #:    [id number]       Weight:       108.7 lb Date of Birth:  1936/01/07       BSA:          1.475 m Patient Age:    86 years         BP:           159/75 mmHg Patient Gender: F                HR:           63 bpm. Exam Location:  [facility] Procedure: 2D Echo, Cardiac Doppler, Color Doppler and Intracardiac            Opacification Agent Indications:     TIA  History:         Patient has prior history of Echocardiogram examinations, most                  recent 03/25/2023. CHF,  Previous Myocardial Infarction, TIA, PAD                  and COPD, Arrythmias:RBBB, Signs/Symptoms:Shortness of Breath;                  Risk Factors:Hypertension and Non-Smoker. IVC filter.  Sonographer:     Mikki Harbor Referring Phys:  [id number] Lurene Shadow Diagnosing Phys: Julien Nordmann MD  Sonographer Comments: Technically difficult study due to poor echo windows. Image acquisition challenging due to breast implants. IMPRESSIONS  1. Left ventricular ejection fraction, by estimation, is 50 to 55%. The left ventricle has low normal function. The left ventricle has no regional wall motion abnormalities. There is moderate left ventricular hypertrophy. Left ventricular diastolic parameters are consistent with Grade I diastolic dysfunction (impaired relaxation).  2. Right ventricular systolic function is low normal. The right ventricular size is normal. There is normal pulmonary artery systolic pressure.  3. The mitral valve is normal in structure. Mild mitral valve regurgitation. No evidence of mitral stenosis.  4. Tricuspid valve regurgitation is mild to moderate.  5. The aortic valve has an indeterminant number of cusps. Aortic valve regurgitation is mild. Aortic valve sclerosis is present, with no evidence of aortic valve stenosis.  6. The inferior vena cava is normal in size with greater than 50% respiratory variability, suggesting right atrial pressure of 3 mmHg. FINDINGS  Left Ventricle: Left ventricular ejection fraction, by estimation, is 50 to 55%.  The left ventricle has low normal function. The left ventricle has no regional wall motion abnormalities. Definity contrast agent was given IV to delineate the left ventricular endocardial borders. The left ventricular internal cavity size was normal in size. There is moderate left ventricular hypertrophy. Left ventricular diastolic parameters are consistent with Grade I diastolic dysfunction (impaired relaxation). Right Ventricle: The right ventricular size  is normal. No increase in right ventricular wall thickness. Right ventricular systolic function is low normal. There is normal pulmonary artery systolic pressure. The tricuspid regurgitant velocity is 2.22 m/s,  and with an assumed right atrial pressure of 3 mmHg, the estimated right ventricular systolic pressure is 22.7 mmHg. Left Atrium: Left atrial size was normal in size. Right Atrium: Right atrial size was normal in size. Pericardium: There is no evidence of pericardial effusion. Mitral Valve: The mitral valve is normal in structure. There is mild calcification of the mitral valve leaflet(s). Mild mitral valve regurgitation. No evidence of mitral valve stenosis. MV peak gradient, 1.9 mmHg. The mean mitral valve gradient is 1.0 mmHg. Tricuspid Valve: The tricuspid valve is normal in structure. Tricuspid valve regurgitation is mild to moderate. No evidence of tricuspid stenosis. Aortic Valve: The aortic valve has an indeterminant number of cusps. Aortic valve regurgitation is mild. Aortic regurgitation PHT measures 547 msec. Aortic valve sclerosis is present, with no evidence of aortic valve stenosis. Aortic valve mean gradient measures 4.0 mmHg. Aortic valve peak gradient measures 6.9 mmHg. Aortic valve area, by VTI measures 2.43 cm. Pulmonic Valve: The pulmonic valve was normal in structure. Pulmonic valve regurgitation is not visualized. No evidence of pulmonic stenosis. Aorta: The aortic root is normal in size and structure. Venous: The inferior vena cava is normal in size with greater than 50% respiratory variability, suggesting right atrial pressure of 3 mmHg. IAS/Shunts: No atrial level shunt detected by color flow Doppler.  LEFT VENTRICLE PLAX 2D LVIDd:         3.90 cm     Diastology LVIDs:         2.60 cm     LV e' medial:    4.03 cm/s LV PW:         1.50 cm     LV E/e' medial:  9.1 LV IVS:        1.70 cm     LV e' lateral:   9.57 cm/s LVOT diam:     2.00 cm     LV E/e' lateral: 3.8 LV SV:         60 LV  SV Index:   41 LVOT Area:     3.14 cm  LV Volumes (MOD) LV vol d, MOD A4C: 59.5 ml LV vol s, MOD A4C: 27.9 ml LV SV MOD A4C:     59.5 ml RIGHT VENTRICLE RV Basal diam:  3.90 cm RV Mid diam:    3.10 cm RV S prime:     10.40 cm/s TAPSE (M-mode): 1.7 cm LEFT ATRIUM             Index        RIGHT ATRIUM           Index LA diam:        2.50 cm 1.69 cm/m   RA Area:     16.30 cm LA Vol (A2C):   68.5 ml 46.43 ml/m  RA Volume:   43.00 ml  29.15 ml/m LA Vol (A4C):   58.1 ml 39.38 ml/m LA Biplane Vol: 63.9 ml 43.31 ml/m  AORTIC VALVE                    PULMONIC VALVE AV Area (Vmax):    2.23 cm     PV Vmax:       0.90 m/s AV Area (Vmean):   2.13 cm     PV Peak grad:  3.3 mmHg AV Area (VTI):     2.43 cm AV Vmax:           131.00 cm/s AV Vmean:          91.600 cm/s AV VTI:            0.247 m AV Peak Grad:      6.9 mmHg AV Mean Grad:      4.0 mmHg LVOT Vmax:         93.10 cm/s LVOT Vmean:        62.100 cm/s LVOT VTI:          0.191 m LVOT/AV VTI ratio: 0.77 AI PHT:            547 msec  AORTA Ao Root diam: 3.60 cm Ao Asc diam:  3.60 cm MITRAL VALVE               TRICUSPID VALVE MV Area (PHT): 2.60 cm    TR Peak grad:   19.7 mmHg MV Area VTI:   2.28 cm    TR Vmax:        222.00 cm/s MV Peak grad:  1.9 mmHg MV Mean grad:  1.0 mmHg    SHUNTS MV Vmax:       0.69 m/s    Systemic VTI:  0.19 m MV Vmean:      34.1 cm/s   Systemic Diam: 2.00 cm MV Decel Time: 292 msec MV E velocity: 36.80 cm/s MV A velocity: 63.60 cm/s MV E/A ratio:  0.58 Julien Nordmann MD Electronically signed by Julien Nordmann MD Signature Date/Time: 08/18/2023/5:17:56 PM    Final    MR BRAIN WO CONTRAST  Result Date: 08/17/2023 CLINICAL DATA:  Provided history: Neuro deficit, acute, stroke suspected. EXAM: MRI HEAD WITHOUT CONTRAST MRA HEAD WITHOUT CONTRAST MRA NECK WITHOUT AND WITH CONTRAST TECHNIQUE: Multiplanar, multi-echo pulse sequences of the brain and surrounding structures were acquired without intravenous contrast. Angiographic images of the  Circle of Willis were acquired using MRA technique without intravenous contrast. Angiographic images of the neck were acquired using MRA technique without and with intravenous contrast. Carotid stenosis measurements (when applicable) are obtained utilizing NASCET criteria, using the distal internal carotid diameter as the denominator. CONTRAST:  6mL GADAVIST GADOBUTROL 1 MMOL/ML IV SOLN COMPARISON:  Non-contrast head CT 08/17/2023. FINDINGS: MRI HEAD FINDINGS Brain: Generalized cerebral atrophy. Prominence of the ventricles and sulci appears commensurate. Chronic lacunar infarct within the right basal ganglia/genu of right internal capsule, new from the prior brain MRI 01/03/2020. Background multifocal T2 FLAIR hyperintense signal abnormality within the cerebral white matter, nonspecific but compatible with moderate chronic small vessel ischemic disease. There are a few nonspecific chronic microhemorrhages scattered within the supratentorial brain. Mild chronic small vessel ischemic changes within the pons. Tiny chronic infarct again noted within the left cerebellar hemisphere. There is no acute infarct. No evidence of an intracranial mass No extra-axial fluid collection. No midline shift. Vascular: Maintained flow voids within the proximal large arterial vessels. Skull and upper cervical spine: No focal worrisome marrow lesion. Sinuses/Orbits: No mass or acute finding within the imaged orbits. Prior bilateral ocular lens replacement. Mucosal  thickening within the bilateral ethmoid sinuses, overall moderate-to-severe. Small fluid level within the right sphenoid sinus. Minimal mucosal thickening also present within the bilateral frontal and bilateral maxillary sinuses. Other: Small-volume fluid within the right mastoid air cells. MRA HEAD FINDINGS Mild-to-moderately motion degraded examination. Within this limitation, findings are as follows. Anterior circulation: The intracranial internal carotid arteries are  patent. The M1 middle cerebral arteries are patent. No M2 proximal branch occlusion is identified. The anterior cerebral arteries are patent. No intracranial aneurysm is identified. 1-2 mm inferiorly projecting vascular protrusion arising from the supraclinoid right ICA, which may reflect an aneurysm or infundibulum (series 1043, image 16). Posterior circulation: The intracranial vertebral arteries are patent. The basilar artery is patent. The posterior cerebral arteries are patent. Posterior communicating arteries are diminutive or absent, bilaterally. Anatomic variants: As described. MRA NECK FINDINGS Aortic arch: Standard aortic branching. No hemodynamically significant innominate or proximal subclavian artery stenosis. Right carotid system: CCA and ICA patent within the neck without stenosis. Left carotid system: CCA and ICA patent within the neck without stenosis. Vertebral arteries: Vertebral arteries patent within the neck without stenosis. The left vertebral artery is dominant. IMPRESSION: MRI brain: 1. No evidence of an acute intracranial abnormality. 2. Parenchymal atrophy, chronic small vessel ischemic disease and chronic infarcts as described. Of note, a chronic lacunar infarct within the right basal ganglia/genu of right internal capsule is new from the prior MRI of 01/03/2020. 3. Paranasal sinus disease as outlined. 4. Small-volume fluid within the right mastoid air cells. MRA head: 1. Mild to moderately motion degraded examination. 2. No proximal intracranial large vessel occlusion identified. 3. 1-2 mm inferiorly projecting vascular protrusion arising from the supraclinoid right internal carotid artery, which may reflect an aneurysm or infundibulum. MRA neck: The common carotid, internal carotid and vertebral arteries are patent within the neck without stenosis. Electronically Signed   By: Jackey Loge D.O.   On: 08/17/2023 13:18   MR ANGIO HEAD WO CONTRAST  Result Date: 08/17/2023 CLINICAL  DATA:  Provided history: Neuro deficit, acute, stroke suspected. EXAM: MRI HEAD WITHOUT CONTRAST MRA HEAD WITHOUT CONTRAST MRA NECK WITHOUT AND WITH CONTRAST TECHNIQUE: Multiplanar, multi-echo pulse sequences of the brain and surrounding structures were acquired without intravenous contrast. Angiographic images of the Circle of Willis were acquired using MRA technique without intravenous contrast. Angiographic images of the neck were acquired using MRA technique without and with intravenous contrast. Carotid stenosis measurements (when applicable) are obtained utilizing NASCET criteria, using the distal internal carotid diameter as the denominator. CONTRAST:  6mL GADAVIST GADOBUTROL 1 MMOL/ML IV SOLN COMPARISON:  Non-contrast head CT 08/17/2023. FINDINGS: MRI HEAD FINDINGS Brain: Generalized cerebral atrophy. Prominence of the ventricles and sulci appears commensurate. Chronic lacunar infarct within the right basal ganglia/genu of right internal capsule, new from the prior brain MRI 01/03/2020. Background multifocal T2 FLAIR hyperintense signal abnormality within the cerebral white matter, nonspecific but compatible with moderate chronic small vessel ischemic disease. There are a few nonspecific chronic microhemorrhages scattered within the supratentorial brain. Mild chronic small vessel ischemic changes within the pons. Tiny chronic infarct again noted within the left cerebellar hemisphere. There is no acute infarct. No evidence of an intracranial mass No extra-axial fluid collection. No midline shift. Vascular: Maintained flow voids within the proximal large arterial vessels. Skull and upper cervical spine: No focal worrisome marrow lesion. Sinuses/Orbits: No mass or acute finding within the imaged orbits. Prior bilateral ocular lens replacement. Mucosal thickening within the bilateral ethmoid sinuses, overall moderate-to-severe. Small fluid level  within the right sphenoid sinus. Minimal mucosal thickening also  present within the bilateral frontal and bilateral maxillary sinuses. Other: Small-volume fluid within the right mastoid air cells. MRA HEAD FINDINGS Mild-to-moderately motion degraded examination. Within this limitation, findings are as follows. Anterior circulation: The intracranial internal carotid arteries are patent. The M1 middle cerebral arteries are patent. No M2 proximal branch occlusion is identified. The anterior cerebral arteries are patent. No intracranial aneurysm is identified. 1-2 mm inferiorly projecting vascular protrusion arising from the supraclinoid right ICA, which may reflect an aneurysm or infundibulum (series 1043, image 16). Posterior circulation: The intracranial vertebral arteries are patent. The basilar artery is patent. The posterior cerebral arteries are patent. Posterior communicating arteries are diminutive or absent, bilaterally. Anatomic variants: As described. MRA NECK FINDINGS Aortic arch: Standard aortic branching. No hemodynamically significant innominate or proximal subclavian artery stenosis. Right carotid system: CCA and ICA patent within the neck without stenosis. Left carotid system: CCA and ICA patent within the neck without stenosis. Vertebral arteries: Vertebral arteries patent within the neck without stenosis. The left vertebral artery is dominant. IMPRESSION: MRI brain: 1. No evidence of an acute intracranial abnormality. 2. Parenchymal atrophy, chronic small vessel ischemic disease and chronic infarcts as described. Of note, a chronic lacunar infarct within the right basal ganglia/genu of right internal capsule is new from the prior MRI of 01/03/2020. 3. Paranasal sinus disease as outlined. 4. Small-volume fluid within the right mastoid air cells. MRA head: 1. Mild to moderately motion degraded examination. 2. No proximal intracranial large vessel occlusion identified. 3. 1-2 mm inferiorly projecting vascular protrusion arising from the supraclinoid right internal  carotid artery, which may reflect an aneurysm or infundibulum. MRA neck: The common carotid, internal carotid and vertebral arteries are patent within the neck without stenosis. Electronically Signed   By: Jackey Loge D.O.   On: 08/17/2023 13:18   MR ANGIO NECK W WO CONTRAST  Result Date: 08/17/2023 CLINICAL DATA:  Provided history: Neuro deficit, acute, stroke suspected. EXAM: MRI HEAD WITHOUT CONTRAST MRA HEAD WITHOUT CONTRAST MRA NECK WITHOUT AND WITH CONTRAST TECHNIQUE: Multiplanar, multi-echo pulse sequences of the brain and surrounding structures were acquired without intravenous contrast. Angiographic images of the Circle of Willis were acquired using MRA technique without intravenous contrast. Angiographic images of the neck were acquired using MRA technique without and with intravenous contrast. Carotid stenosis measurements (when applicable) are obtained utilizing NASCET criteria, using the distal internal carotid diameter as the denominator. CONTRAST:  6mL GADAVIST GADOBUTROL 1 MMOL/ML IV SOLN COMPARISON:  Non-contrast head CT 08/17/2023. FINDINGS: MRI HEAD FINDINGS Brain: Generalized cerebral atrophy. Prominence of the ventricles and sulci appears commensurate. Chronic lacunar infarct within the right basal ganglia/genu of right internal capsule, new from the prior brain MRI 01/03/2020. Background multifocal T2 FLAIR hyperintense signal abnormality within the cerebral white matter, nonspecific but compatible with moderate chronic small vessel ischemic disease. There are a few nonspecific chronic microhemorrhages scattered within the supratentorial brain. Mild chronic small vessel ischemic changes within the pons. Tiny chronic infarct again noted within the left cerebellar hemisphere. There is no acute infarct. No evidence of an intracranial mass No extra-axial fluid collection. No midline shift. Vascular: Maintained flow voids within the proximal large arterial vessels. Skull and upper cervical  spine: No focal worrisome marrow lesion. Sinuses/Orbits: No mass or acute finding within the imaged orbits. Prior bilateral ocular lens replacement. Mucosal thickening within the bilateral ethmoid sinuses, overall moderate-to-severe. Small fluid level within the right sphenoid sinus. Minimal mucosal thickening also  present within the bilateral frontal and bilateral maxillary sinuses. Other: Small-volume fluid within the right mastoid air cells. MRA HEAD FINDINGS Mild-to-moderately motion degraded examination. Within this limitation, findings are as follows. Anterior circulation: The intracranial internal carotid arteries are patent. The M1 middle cerebral arteries are patent. No M2 proximal branch occlusion is identified. The anterior cerebral arteries are patent. No intracranial aneurysm is identified. 1-2 mm inferiorly projecting vascular protrusion arising from the supraclinoid right ICA, which may reflect an aneurysm or infundibulum (series 1043, image 16). Posterior circulation: The intracranial vertebral arteries are patent. The basilar artery is patent. The posterior cerebral arteries are patent. Posterior communicating arteries are diminutive or absent, bilaterally. Anatomic variants: As described. MRA NECK FINDINGS Aortic arch: Standard aortic branching. No hemodynamically significant innominate or proximal subclavian artery stenosis. Right carotid system: CCA and ICA patent within the neck without stenosis. Left carotid system: CCA and ICA patent within the neck without stenosis. Vertebral arteries: Vertebral arteries patent within the neck without stenosis. The left vertebral artery is dominant. IMPRESSION: MRI brain: 1. No evidence of an acute intracranial abnormality. 2. Parenchymal atrophy, chronic small vessel ischemic disease and chronic infarcts as described. Of note, a chronic lacunar infarct within the right basal ganglia/genu of right internal capsule is new from the prior MRI of 01/03/2020. 3.  Paranasal sinus disease as outlined. 4. Small-volume fluid within the right mastoid air cells. MRA head: 1. Mild to moderately motion degraded examination. 2. No proximal intracranial large vessel occlusion identified. 3. 1-2 mm inferiorly projecting vascular protrusion arising from the supraclinoid right internal carotid artery, which may reflect an aneurysm or infundibulum. MRA neck: The common carotid, internal carotid and vertebral arteries are patent within the neck without stenosis. Electronically Signed   By: Jackey Loge D.O.   On: 08/17/2023 13:18   EEG adult  Result Date: 08/17/2023 Charlsie Quest, MD     08/17/2023  4:21 PM Patient Name: Lindsey [name] MRN: [id number] Epilepsy Attending: Charlsie Quest Referring Physician/Provider: Gordy Councilman, MD Date: 08/17/2023 Duration: 42.12 mins Patient history: 87yo female with dysarthria getting eeg to evaluate for seizure Level of alertness: Awake AEDs during EEG study: None Technical aspects: This EEG study was done with scalp electrodes positioned according to the 10-20 International system of electrode placement. Electrical activity was reviewed with band pass filter of 1-70Hz , sensitivity of 7 uV/mm, display speed of 14mm/sec with a 60Hz  notched filter applied as appropriate. EEG data were recorded continuously and digitally stored.  Video monitoring was available and reviewed as appropriate. Description: The posterior dominant rhythm consists of 8 Hz activity of moderate voltage (25-35 uV) seen predominantly in posterior head regions, symmetric and reactive to eye opening and eye closing. EEG showed intermittent generalized 3 to 6 Hz theta-delta slowing.Hyperventilation and photic stimulation were not performed.   ABNORMALITY - Intermittent slow, generalized IMPRESSION: This study is suggestive of mild diffuse encephalopathy. No seizures or epileptiform discharges were seen throughout the recording. Charlsie Quest   CT HEAD CODE STROKE  WO CONTRAST  Result Date: 08/17/2023 CLINICAL DATA:  Code stroke. Neuro deficit, acute, stroke suspected. Aphasia. EXAM: CT HEAD WITHOUT CONTRAST TECHNIQUE: Contiguous axial images were obtained from the base of the skull through the vertex without intravenous contrast. RADIATION DOSE REDUCTION: This exam was performed according to the departmental dose-optimization program which includes automated exposure control, adjustment of the mA and/or kV according to patient size and/or use of iterative reconstruction technique. COMPARISON:  Head CT 05/30/2021 FINDINGS: Brain:  No focal abnormality seen affecting the brainstem or cerebellum. Some cerebellar atrophy. Cerebral hemispheres show age related volume loss with chronic small-vessel ischemic changes of the white matter. No sign of acute infarction, mass lesion, hemorrhage, hydrocephalus or extra-axial collection. Vascular: No abnormal vascular finding. Skull: Normal Sinuses/Orbits: Mild inflammatory changes of the ethmoid and sphenoid sinuses. Orbits negative. Other: None ASPECTS (Alberta Stroke Program Early CT Score) - Ganglionic level infarction (caudate, lentiform nuclei, internal capsule, insula, M1-M3 cortex): 7 - Supraganglionic infarction (M4-M6 cortex): 3 Total score (0-10 with 10 being normal): 10 IMPRESSION: 1. No acute CT finding. Age related volume loss and chronic small-vessel ischemic changes of the white matter. 2. Aspects is 10. These results were communicated to Dr. Iver Nestle at 11:41 am on 08/17/2023 by text page via the [facility] messaging system. Electronically Signed   By: Paulina Fusi M.D.   On: 08/17/2023 11:43

## 2023-09-11 LAB — MISC LABCORP TEST (SEND OUT): Labcorp test code: 143000

## 2023-09-13 LAB — KAPPA/LAMBDA LIGHT CHAINS
Kappa free light chain: 18.5 mg/L (ref 3.3–19.4)
Kappa, lambda light chain ratio: 0.78 (ref 0.26–1.65)
Lambda free light chains: 23.7 mg/L (ref 5.7–26.3)

## 2023-09-14 ENCOUNTER — Other Ambulatory Visit: Payer: Self-pay

## 2023-09-14 DIAGNOSIS — D472 Monoclonal gammopathy: Secondary | ICD-10-CM

## 2023-09-16 LAB — MULTIPLE MYELOMA PANEL, SERUM
Albumin SerPl Elph-Mcnc: 3.7 g/dL (ref 2.9–4.4)
Albumin/Glob SerPl: 1.7 (ref 0.7–1.7)
Alpha 1: 0.2 g/dL (ref 0.0–0.4)
Alpha2 Glob SerPl Elph-Mcnc: 0.6 g/dL (ref 0.4–1.0)
B-Globulin SerPl Elph-Mcnc: 0.8 g/dL (ref 0.7–1.3)
Gamma Glob SerPl Elph-Mcnc: 0.5 g/dL (ref 0.4–1.8)
Globulin, Total: 2.2 g/dL (ref 2.2–3.9)
IgA: 138 mg/dL (ref 64–422)
IgG (Immunoglobin G), Serum: 536 mg/dL — ABNORMAL LOW (ref 586–1602)
IgM (Immunoglobulin M), Srm: 35 mg/dL (ref 26–217)
M Protein SerPl Elph-Mcnc: 0.2 g/dL — ABNORMAL HIGH
Total Protein ELP: 5.9 g/dL — ABNORMAL LOW (ref 6.0–8.5)

## 2023-09-17 LAB — IFE+PROTEIN ELECTRO, 24-HR UR
% BETA, Urine: 29.6 %
ALPHA 1 URINE: 2.9 %
Albumin, U: 33.7 %
Alpha 2, Urine: 12 %
GAMMA GLOBULIN URINE: 21.7 %
Total Protein, Urine-Ur/day: 241 mg/(24.h) — ABNORMAL HIGH (ref 30–150)
Total Protein, Urine: 13.6 mg/dL
Total Volume: 1775

## 2023-10-04 ENCOUNTER — Encounter: Payer: Self-pay | Admitting: Oncology

## 2023-10-04 ENCOUNTER — Inpatient Hospital Stay (HOSPITAL_BASED_OUTPATIENT_CLINIC_OR_DEPARTMENT_OTHER): Payer: Medicare Other | Admitting: Oncology

## 2023-10-04 ENCOUNTER — Other Ambulatory Visit: Payer: Self-pay | Admitting: Cardiovascular Disease

## 2023-10-04 VITALS — BP 144/87 | HR 60 | Temp 98.1°F | Resp 18 | Wt 132.4 lb

## 2023-10-04 DIAGNOSIS — D472 Monoclonal gammopathy: Secondary | ICD-10-CM | POA: Diagnosis not present

## 2023-10-04 DIAGNOSIS — D72810 Lymphocytopenia: Secondary | ICD-10-CM | POA: Diagnosis not present

## 2023-10-04 DIAGNOSIS — E538 Deficiency of other specified B group vitamins: Secondary | ICD-10-CM

## 2023-10-04 NOTE — Progress Notes (Signed)
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 Continue observation.  Pro- BNP is elevated ,she has history of CHF  I plan to discuss with her cardiologist to see if she needs to get cardiac MRI done to rule out amyloidosis.   B12 deficiency Continue B12 supplementation daily.   Lymphocytopenia Possibly due to B12 deficiency.    Orders Placed This Encounter  Procedures   CBC with Differential (Cancer Center Only)    Standing Status:   Future    Expected Date:   04/03/2024    Expiration Date:   10/03/2024   CMP (Cancer Center only)    Standing Status:   Future    Expected Date:   04/03/2024    Expiration Date:   10/03/2024   Kappa/lambda light chains    Standing Status:   Future    Expected Date:   04/03/2024    Expiration Date:   10/03/2024   Multiple Myeloma Panel (SPEP&IFE w/QIG)    Standing Status:   Future    Expected Date:   04/03/2024    Expiration Date:   10/03/2024   Follow up  in 6 months  All questions were answered. The patient knows to call the clinic with any problems, questions or concerns.  Rickard Patience, MD, PhD [facility] Hematology Oncology 10/04/2023    HISTORY OF PRESENTING ILLNESS:  Lindsey [name] is a 87 y.o. female who was seen in consultation at the request of Barbette Reichmann, MD for evaluation of abnormal SPEP  results.   08/16/2023 SPEP showed M spike 0.2g/dL,   and IFE showed IgG monoclonal protein with Lamda light chain specificity  She denies history of abnormal bone pain or bone fracture. Patient denies history of recurrent infection.. Denies chills, night sweats, anorexia or abnormal weight loss.  She has low B12 level at 298, she has been advised to start B12 supplementation.  Cognitive Impairment with word finding difficulty she follows up with neurology  INTERVAL HISTORY Lindsey [name] is a 87 y.o. female who has above history reviewed by me today presents for follow up visit for IgG lamda MGUS. She feels well. No new complaints. She feels " SOB if I eats too much".   MEDICAL HISTORY:  Past Medical History:  Diagnosis Date   Anemia    Atrial tachycardia (HCC)    Breast cancer (HCC)    Bronchiectasis (HCC) 06/2007   CHF (congestive heart failure) (HCC)    Chronic Dizziness    Colitis    COPD (chronic obstructive pulmonary disease) (HCC)    Depression    Diastolic dysfunction    a. 12/2019 Echo: EF 60-65%, no rwma, GrII DD. Nl RV size/fxn. Mild MR/AI.   DVT (deep venous thrombosis) (HCC)    after her right knee surgery.    Fibromyalgia    Labile Hypertension    Migraine headache with aura    Osteoarthritis  Osteoporosis    PAD (peripheral artery disease) (HCC)    Pneumonia    [place] 1952   Stenosis of carotid artery     SURGICAL HISTORY: Past Surgical History:  Procedure Laterality Date   ABDOMINAL HYSTERECTOMY     BACK SURGERY     BILATERAL TOTAL MASTECTOMY WITH AXILLARY LYMPH NODE DISSECTION     BREAST SURGERY     CARPAL TUNNEL RELEASE     bilateral    COLONOSCOPY WITH PROPOFOL N/A 08/02/2015   Procedure: COLONOSCOPY WITH PROPOFOL;  Surgeon: Scot Jun, MD;  Location: [facility] ENDOSCOPY;  Service: Endoscopy;  Laterality: N/A;   FINGER TENDON REPAIR     HERNIA REPAIR     REPLACEMENT TOTAL KNEE     right knee    RIGHT/LEFT HEART CATH AND CORONARY  ANGIOGRAPHY N/A 03/03/2022   Procedure: RIGHT/LEFT HEART CATH AND CORONARY ANGIOGRAPHY;  Surgeon: Yvonne Kendall, MD;  Location: [facility] INVASIVE CV LAB;  Service: Cardiovascular;  Laterality: N/A;   SQUAMOUS CELL CARCINOMA EXCISION     TONSILLECTOMY     TOTAL KNEE ARTHROPLASTY Left 09/19/2020   Procedure: TOTAL KNEE ARTHROPLASTY;  Surgeon: Kennedy Bucker, MD;  Location: [facility] ORS;  Service: Orthopedics;  Laterality: Left;   UMBILICAL HERNIA REPAIR     VAGINAL HYSTERECTOMY      SOCIAL HISTORY: Social History   Socioeconomic History   Marital status: Widowed    Spouse name: Not on file   Number of children: Not on file   Years of education: Not on file   Highest education level: Not on file  Occupational History   Not on file  Tobacco Use   Smoking status: Never   Smokeless tobacco: Never  Vaping Use   Vaping status: Never Used  Substance and Sexual Activity   Alcohol use: Yes    Alcohol/week: 4.0 standard drinks of alcohol    Types: 4 Glasses of wine per week    Comment: a glass of wine every other day   Drug use: No   Sexual activity: Not on file  Other Topics Concern   Not on file  Social History Narrative   Not on file   Social Drivers of Health   Financial Resource Strain: Low Risk  (07/15/2023)   Received from [facility]   Overall Financial Resource Strain (CARDIA)    Difficulty of Paying Living Expenses: Not hard at all  Food Insecurity: No Food Insecurity (08/17/2023)   Hunger Vital Sign    Worried About Running Out of Food in the Last Year: Never true    Ran Out of Food in the Last Year: Never true  Transportation Needs: No Transportation Needs (08/17/2023)   PRAPARE - Administrator, Civil Service (Medical): No    Lack of Transportation (Non-Medical): No  Physical Activity: Not on file  Stress: Not on file  Social Connections: Not on file  Intimate Partner Violence: Not At Risk (08/17/2023)   Humiliation, Afraid, Rape, and  Kick questionnaire    Fear of Current or Ex-Partner: No    Emotionally Abused: No    Physically Abused: No    Sexually Abused: No    FAMILY HISTORY: Family History  Problem Relation Age of Onset   Pancreatic cancer Mother    Arthritis Mother    Ovarian cancer Mother    Arthritis Father    Bladder Cancer Father    Atrial fibrillation Sister    Endometrial cancer Sister    Colon cancer Sister  ALLERGIES:  is allergic to ivp dye [iodinated contrast media], charentais melon (french melon), codeine, cucumber extract, demeclocycline, galcanezumab-gnlm, sulfa antibiotics, tegaderm ag mesh [silver], telmisartan, tetracyclines & related, and wild lettuce extract (lactuca virosa).  MEDICATIONS:  Current Outpatient Medications  Medication Sig Dispense Refill   acetaminophen (TYLENOL) 500 MG tablet Take 500 mg by mouth at bedtime as needed for moderate pain.     albuterol (VENTOLIN HFA) 108 (90 Base) MCG/ACT inhaler Inhale 2 puffs into the lungs every 6 (six) hours as needed for wheezing or shortness of breath.     ARIPiprazole (ABILIFY) 2 MG tablet Take 2 mg by mouth daily.      aspirin EC 81 MG tablet Take 1 tablet (81 mg total) by mouth daily. Swallow whole.     bisoprolol (ZEBETA) 5 MG tablet TAKE 1 TABLET BY MOUTH DAILY 90 tablet 0   clopidogrel (PLAVIX) 75 MG tablet TAKE ONE TABLET EACH MORNING WITH BREAKFAST 90 tablet 3   cyanocobalamin (VITAMIN B12) 1000 MCG tablet Take 1 tablet (1,000 mcg total) by mouth daily.     cyclobenzaprine (FLEXERIL) 5 MG tablet Take 5 mg by mouth at bedtime as needed.     doxazosin (CARDURA) 2 MG tablet Take 1 tablet (2 mg total) by mouth daily. 90 tablet 2   Evolocumab (REPATHA SURECLICK) 140 MG/ML SOAJ Inject 140 mg into the skin every 14 (fourteen) days. 2 mL 12   ezetimibe (ZETIA) 10 MG tablet TAKE 1 TABLET BY MOUTH DAILY. 90 tablet 2   furosemide (LASIX) 40 MG tablet Take 1 tablet (40 mg total) by mouth as needed (for weight gain of 3 lbs in a day or  5 lbs in a week). 30 tablet 0   hydrOXYzine (ATARAX/VISTARIL) 25 MG tablet Take 25 mg by mouth as needed for anxiety.     losartan (COZAAR) 25 MG tablet Take 1 tablet (25 mg total) by mouth daily. 90 tablet 1   Multiple Vitamins-Minerals (PRESERVISION AREDS 2 PO) Take by mouth.     Omega-3 Fatty Acids (FISH OIL OMEGA-3 PO) Take 2 capsules by mouth at bedtime.     polyethylene glycol (MIRALAX / GLYCOLAX) packet Take 17 g by mouth daily as needed for mild constipation or moderate constipation.     potassium chloride (KLOR-CON M) 10 MEQ tablet TAKE 1 TABLET BY MOUTH DAILY 90 tablet 3   SYMBICORT 160-4.5 MCG/ACT inhaler SMARTSIG:2 Inhalation Via Inhaler Twice Daily     venlafaxine XR (EFFEXOR-XR) 150 MG 24 hr capsule Take 150 mg by mouth daily.     furosemide (LASIX) 20 MG tablet Take 1 tablet (20 mg total) by mouth every other day. (Patient not taking: Reported on 10/04/2023) 45 tablet 3   nitroGLYCERIN (NITROLINGUAL) 0.4 MG/SPRAY spray Place 1 spray under the tongue every 5 (five) minutes x 3 doses as needed for chest pain. (Patient not taking: Reported on 10/04/2023) 12 g 12   predniSONE (DELTASONE) 50 MG tablet Take one tablet 13 hours, 7 hours, and 1 hour prior to scan. (Patient not taking: Reported on 10/04/2023) 3 tablet 0   sodium chloride HYPERTONIC 3 % nebulizer solution Take 5 mLs by nebulization every 4 (four) hours as needed. (Patient not taking: Reported on 09/10/2023)     No current facility-administered medications for this visit.    Review of Systems  Constitutional:  Negative for appetite change, chills, fatigue and fever.  HENT:   Negative for hearing loss and voice change.   Eyes:  Negative for eye problems.  Respiratory:  Negative for chest tightness and cough.   Cardiovascular:  Negative for chest pain.  Gastrointestinal:  Negative for abdominal distention, abdominal pain and blood in stool.  Endocrine: Negative for hot flashes.  Genitourinary:  Negative for difficulty  urinating and frequency.   Musculoskeletal:  Negative for arthralgias.  Skin:  Negative for itching and rash.  Neurological:  Negative for extremity weakness.       Cognitive Impairment with word finding difficulty  Hematological:  Negative for adenopathy.  Psychiatric/Behavioral:  Negative for confusion.    PHYSICAL EXAMINATION: ECOG PERFORMANCE STATUS: 1 - Symptomatic but completely ambulatory Vitals:   10/04/23 1355  BP: (!) 144/87  Pulse: 60  Resp: 18  Temp: 98.1 F (36.7 C)  SpO2: 95%   Filed Weights   10/04/23 1355  Weight: 132 lb 6.4 oz (60.1 kg)    Physical Exam Constitutional:      General: She is not in acute distress. HENT:     Head: Normocephalic and atraumatic.  Eyes:     General: No scleral icterus. Cardiovascular:     Rate and Rhythm: Normal rate and regular rhythm.     Heart sounds: Normal heart sounds.  Pulmonary:     Effort: Pulmonary effort is normal. No respiratory distress.     Breath sounds: No wheezing.  Abdominal:     General: Bowel sounds are normal. There is no distension.     Palpations: Abdomen is soft.  Musculoskeletal:        General: No deformity. Normal range of motion.     Cervical back: Normal range of motion and neck supple.  Skin:    General: Skin is warm and dry.     Findings: No erythema or rash.  Neurological:     Mental Status: She is alert and oriented to person, place, and time. Mental status is at baseline.     Cranial Nerves: No cranial nerve deficit.     Coordination: Coordination normal.  Psychiatric:        Mood and Affect: Mood normal.      LABORATORY DATA:  I have reviewed the data as listed    Latest Ref Rng & Units 09/10/2023   12:38 PM 08/23/2023    3:53 AM 08/17/2023   11:47 AM  CBC  WBC 4.0 - 10.5 K/uL 7.2  5.0  6.1   Hemoglobin 12.0 - 15.0 g/dL 91.4  78.2  95.6   Hematocrit 36.0 - 46.0 % 40.4  39.2  41.7   Platelets 150 - 400 K/uL 214  184  188       Latest Ref Rng & Units 08/23/2023    3:53  AM 08/17/2023   11:47 AM 02/11/2023    4:39 PM  CMP  Glucose 70 - 99 mg/dL 90  91  99   BUN 8 - 23 mg/dL 32  20  18   Creatinine 0.44 - 1.00 mg/dL 2.13  0.86  5.78   Sodium 135 - 145 mmol/L 139  137  136   Potassium 3.5 - 5.1 mmol/L 3.5  3.5  3.9   Chloride 98 - 111 mmol/L 102  98  96   CO2 22 - 32 mmol/L 29  30  31    Calcium 8.9 - 10.3 mg/dL 8.8  8.7  9.1   Total Protein 6.5 - 8.1 g/dL  6.0    Total Bilirubin <1.2 mg/dL  0.8    Alkaline Phos 38 - 126 U/L  77    AST  15 - 41 U/L  50    ALT 0 - 44 U/L  25     Lab Results  Component Value Date   MPROTEIN 0.2 (H) 09/10/2023   KAPLAMBRATIO 0.78 09/10/2023          RADIOGRAPHIC STUDIES: I have personally reviewed the radiological images as listed and agreed with the findings in the report.  No results found.

## 2023-10-04 NOTE — Telephone Encounter (Signed)
last office visit: 08/24/23 with plan to f/u in 3 months.--Continue to hold bisoprolol for now.   next office visit: 11/29/23

## 2023-10-04 NOTE — Assessment & Plan Note (Addendum)
Continue B12 supplementation daily.

## 2023-10-04 NOTE — Assessment & Plan Note (Addendum)
IgG lamda MGUS   Lab Results  Component Value Date   MPROTEIN 0.2 (H) 09/10/2023   KPAFRELGTCHN 18.5 09/10/2023   LAMBDASER 23.7 09/10/2023   KAPLAMBRATIO 0.78 09/10/2023    No anemia, hypercalcemia, kidney function impairment. Continue observation.  Pro- BNP is elevated ,she has history of CHF  I plan to discuss with her cardiologist to see if she needs to get cardiac MRI done to rule out amyloidosis.

## 2023-10-04 NOTE — Assessment & Plan Note (Signed)
 Possibly due to B12 deficiency.

## 2023-10-11 ENCOUNTER — Other Ambulatory Visit: Payer: Self-pay | Admitting: Cardiovascular Disease

## 2023-10-19 ENCOUNTER — Telehealth: Payer: Self-pay | Admitting: Cardiovascular Disease

## 2023-10-19 NOTE — Telephone Encounter (Signed)
 Pt c/o BP issue: STAT if pt c/o blurred vision, one-sided weakness or slurred speech  1. What are your last 5 BP readings? 167/97 sitting down,  standing up 128/86  2. Are you having any other symptoms (ex. Dizziness, headache, blurred vision, passed out)? severe dizziness  3. What is your BP issue? This has been going on for two days

## 2023-10-19 NOTE — Telephone Encounter (Signed)
 Called and spoke with patent. Patient with complaint of feeling dizzy times two days. Patient denies other symptoms. Patient reports that today her blood pressure was 167/97 standing and that it dropped to 128/86. Patient reports heart rate of 82. Patient reports that she had some improvement with Meclizine. Patient requesting to be seen. Patient scheduled for 10/21/23.

## 2023-10-20 ENCOUNTER — Encounter: Payer: Self-pay | Admitting: Pharmacist Clinician (PhC)/ Clinical Pharmacy Specialist

## 2023-10-20 ENCOUNTER — Ambulatory Visit
Payer: Medicare Other | Attending: Cardiovascular Disease | Admitting: Pharmacist Clinician (PhC)/ Clinical Pharmacy Specialist

## 2023-10-20 ENCOUNTER — Telehealth: Payer: Self-pay | Admitting: Pharmacist Clinician (PhC)/ Clinical Pharmacy Specialist

## 2023-10-20 DIAGNOSIS — E782 Mixed hyperlipidemia: Secondary | ICD-10-CM

## 2023-10-20 NOTE — Telephone Encounter (Signed)
 Patient had allergic reaction to Repatha  - welts on skin at injection site.  Could we please do prior auth for Praluent 

## 2023-10-20 NOTE — Patient Instructions (Addendum)
 We will send a letter to your insurance to see if they will cover Praluent .  This usually takes about 10-14 days.  Once we get that approval I will send you a message in MyChart and send the prescription to your pharmacy  If you have any questions or concerns, please don't hesitate to reach out to us .  Rhian Asebedo PharmD [facility]

## 2023-10-20 NOTE — Progress Notes (Signed)
Patient came into office because of complaints of lumps under the skin after using Repatha injections.  Has not taken in about 3 months.   When questioned further she is actually describing a topical allergic reaction.  Notes that the redness appears within the first 30 minutes of injection, about 2-3" in diameter and lasts for 2-3 days.  She notes it itches for much of this time.    Will submit a prior authorization to her insurance to switch to Praluent  - have not seen patients have allergic reactions to both products in the past.

## 2023-10-21 ENCOUNTER — Encounter: Payer: Self-pay | Admitting: Nurse Practitioner

## 2023-10-21 ENCOUNTER — Other Ambulatory Visit (HOSPITAL_COMMUNITY): Payer: Self-pay

## 2023-10-21 ENCOUNTER — Telehealth: Payer: Self-pay | Admitting: Pharmacy Technician

## 2023-10-21 ENCOUNTER — Ambulatory Visit: Payer: Medicare Other | Attending: Nurse Practitioner | Admitting: Nurse Practitioner

## 2023-10-21 VITALS — BP 140/92 | HR 72 | Ht 62.0 in | Wt 128.4 lb

## 2023-10-21 DIAGNOSIS — I25810 Atherosclerosis of coronary artery bypass graft(s) without angina pectoris: Secondary | ICD-10-CM | POA: Diagnosis not present

## 2023-10-21 DIAGNOSIS — E785 Hyperlipidemia, unspecified: Secondary | ICD-10-CM | POA: Diagnosis not present

## 2023-10-21 DIAGNOSIS — I5032 Chronic diastolic (congestive) heart failure: Secondary | ICD-10-CM

## 2023-10-21 DIAGNOSIS — I951 Orthostatic hypotension: Secondary | ICD-10-CM | POA: Diagnosis not present

## 2023-10-21 DIAGNOSIS — I10 Essential (primary) hypertension: Secondary | ICD-10-CM | POA: Diagnosis not present

## 2023-10-21 MED ORDER — FUROSEMIDE 20 MG PO TABS: 20.0000 mg | ORAL_TABLET | Freq: Every day | ORAL | 3 refills | Status: DC | PRN

## 2023-10-21 NOTE — Telephone Encounter (Signed)
Pharmacy Patient Advocate Encounter   Received notification from Pt Calls Messages that prior authorization for praluent is required/requested.   Insurance verification completed.   The patient is insured through  [employer]  .   Per test claim: The current 10/21/23 day co-pay is, $90.00 one month.  No PA needed at this time. This test claim was processed through [facility]- copay amounts may vary at other pharmacies due to pharmacy/plan contracts, or as the patient moves through the different stages of their insurance plan.

## 2023-10-21 NOTE — Patient Instructions (Signed)
Medication Instructions:  Your physician recommends the following medication changes.  START TAKING: Lasix (furosemide) 20 mg daily for weight gain of two (2) or more pounds  *If you need a refill on your cardiac medications before your next appointment, please call your pharmacy*   Lab Work: None ordered at this time  If you have labs (blood work) drawn today and your tests are completely normal, you will receive your results only by: MyChart Message (if you have MyChart) OR A paper copy in the mail If you have any lab test that is abnormal or we need to change your treatment, we will call you to review the results.   Follow-Up: At [facility], you and your health needs are our priority.  As part of our continuing mission to provide you with exceptional heart care, we have created designated Provider Care Teams.  These Care Teams include your primary Cardiologist (physician) and Advanced Practice Providers (APPs -  Physician Assistants and Nurse Practitioners) who all work together to provide you with the care you need, when you need it.   Your next appointment:   6-8  week(s)  Provider:   You may see Julien Nordmann, MD or one of the following Advanced Practice Providers on your designated Care Team:   Nicolasa Ducking, NP

## 2023-10-21 NOTE — Progress Notes (Signed)
Office Visit    Patient Name: Lindsey [name] Date of Encounter: 10/21/2023  Primary Care Provider:  Barbette Reichmann, MD Primary Cardiologist:  Julien Nordmann, MD  Chief Complaint    88 y.o. female with a history of CAD, chronic heart failure with improved ejection fraction, hypertension, hyperlipidemia, paroxysmal atrial tachycardia, peripheral arterial disease, bifascicular block, diastolic dysfunction, COPD, venous insufficiency with lower extremity edema, right lower extremity DVT status post IVC filter, and bronchiectasis, who presents for follow-up related to orthostatic hypotension.   Past Medical History  Subjective   Past Medical History:  Diagnosis Date   Anemia    Atrial tachycardia (HCC)    Breast cancer (HCC)    Bronchiectasis (HCC) 06/2007   CAD (coronary artery disease)    a. 02/2022 Cath: LM nl, LAD 56m, D23 40, LCX 120m/d w/ L->L and R->L collats, RCA 62m-->Med Rx.   Chronic Dizziness    Colitis    COPD (chronic obstructive pulmonary disease) (HCC)    Depression    Diastolic dysfunction    a. 12/2019 Echo: EF 60-65%, no rwma, GrII DD. Nl RV size/fxn. Mild MR/AI.   DVT (deep venous thrombosis) (HCC)    after her right knee surgery.    Fibromyalgia    Heart failure with improved ejection fraction (HFimpEF) (HCC)    a. 02/2022 Echo: EF 40-45%, GrIII DD; b. 03/2023 Echo: EF 55-60%; c. 08/2023 Echo: EF 50-55%, mod LVH, GrI DD, low-nl RV fxn, ml PASP, mild MR/AI, mild-mod TR, AoV sclerosis.   Ischemic cardiomyopathy    a. 02/2022 Echo: EF 40-45%; b. 03/2023 Echo: EF 55-60%; c. 08/2023 Echo: EF 50-55%.   Labile Hypertension    Migraine headache with aura    Osteoarthritis    Osteoporosis    PAD (peripheral artery disease) (HCC)    Pneumonia    Polio 1952   Stenosis of carotid artery    Past Surgical History:  Procedure Laterality Date   ABDOMINAL HYSTERECTOMY     BACK SURGERY     BILATERAL TOTAL MASTECTOMY WITH AXILLARY LYMPH NODE DISSECTION     BREAST  SURGERY     CARPAL TUNNEL RELEASE     bilateral    COLONOSCOPY WITH PROPOFOL N/A 08/02/2015   Procedure: COLONOSCOPY WITH PROPOFOL;  Surgeon: Scot Jun, MD;  Location: [facility] ENDOSCOPY;  Service: Endoscopy;  Laterality: N/A;   FINGER TENDON REPAIR     HERNIA REPAIR     REPLACEMENT TOTAL KNEE     right knee    RIGHT/LEFT HEART CATH AND CORONARY ANGIOGRAPHY N/A 03/03/2022   Procedure: RIGHT/LEFT HEART CATH AND CORONARY ANGIOGRAPHY;  Surgeon: Yvonne Kendall, MD;  Location: [facility] INVASIVE CV LAB;  Service: Cardiovascular;  Laterality: N/A;   SQUAMOUS CELL CARCINOMA EXCISION     TONSILLECTOMY     TOTAL KNEE ARTHROPLASTY Left 09/19/2020   Procedure: TOTAL KNEE ARTHROPLASTY;  Surgeon: Kennedy Bucker, MD;  Location: [facility] ORS;  Service: Orthopedics;  Laterality: Left;   UMBILICAL HERNIA REPAIR     VAGINAL HYSTERECTOMY      Allergies  Allergies  Allergen Reactions   Ivp Dye [Iodinated Contrast Media] Anaphylaxis   Charentais Melon (French Melon) Other (See Comments)    All melons cause stomach pain and upset   Codeine Other (See Comments)    Dizziness  Dizziness   Cucumber Extract    Demeclocycline Other (See Comments)    Stomach upset    Galcanezumab-Gnlm Itching   Repatha [Evolocumab] Dermatitis    LUMP AT AREA  OF SHOT AND ITCHING   Sulfa Antibiotics Hives   Tegaderm Ag Mesh [Silver]    Telmisartan     dizziness Other reaction(s): Dizziness dizziness   Tetracyclines & Related     Stomach upset    Wild Lettuce Extract (Lactuca Virosa) Other (See Comments)    Stomach upset and pain      History of Present Illness      88 y.o. y/o female with a history of CAD, chronic heart failure with improved ejection fraction, hypertension, hyperlipidemia, paroxysmal atrial tachycardia, peripheral arterial disease, diastolic dysfunction, bifascicular block, COPD, venous insufficiency with lower extremity edema, right lower extremity DVT status post IVC filter, and bronchiectasis.  In May  2023, patient was admitted due to heart failure non-STEMI with troponin of 1044.  Diagnostic catheterization showed severe single-vessel CAD with complete occlusion of the mid left circumflex with left to left and right to left collaterals.  Medical therapy was advised.  Echo at the time showed an EF of 40 to 45% with grade 3 diastolic dysfunction.  Following adequate diuresis, she was subsequently discharged but required readmission in July 2023 due to heart failure with further escalation of diuretics.  GDMT historically limited by soft blood pressures with discontinuation of losartan and subsequently bisoprolol.  Most recent echo November 2024 showed EF of 50 to 55% with moderate LVH, grade 1 diastolic dysfunction, and mild to moderate TR.    Lindsey [name] was last seen in cardiology clinic in November 2024, at which time she was euvolemic on examination and complained of positional dizziness with orthostatic hypotension (149/85 lying-119/76 standing).  Lasix was reduced to 20 mg every other day.  She has continued to have orthostatic lightheadedness since her last visit.  She has not had any falls or syncope.  She remains off of bisoprolol and losartan and has not required any additional doses of Lasix.  She denies chest pain, dyspnea, palpitations, PND, orthopnea, syncope, edema, or early satiety.  She participates in water aerobics twice a week, which she tolerates well.  She is hoping to add 1/3-day of exercise with some amount of resistance and balance training as well.  Of note, she has noticed a localized skin reaction to Repatha and is being switched to Praluent. Objective  Home Medications    Current Outpatient Medications  Medication Sig Dispense Refill   acetaminophen (TYLENOL) 500 MG tablet Take 500 mg by mouth at bedtime as needed for moderate pain.     albuterol (VENTOLIN HFA) 108 (90 Base) MCG/ACT inhaler Inhale 2 puffs into the lungs every 6 (six) hours as needed for wheezing or shortness of  breath.     ARIPiprazole (ABILIFY) 2 MG tablet Take 2 mg by mouth daily.      aspirin EC 81 MG tablet Take 1 tablet (81 mg total) by mouth daily. Swallow whole.     clopidogrel (PLAVIX) 75 MG tablet TAKE ONE TABLET EACH MORNING WITH BREAKFAST 90 tablet 3   cyanocobalamin (VITAMIN B12) 1000 MCG tablet Take 1 tablet (1,000 mcg total) by mouth daily.     cyclobenzaprine (FLEXERIL) 5 MG tablet Take 5 mg by mouth at bedtime as needed.     doxazosin (CARDURA) 2 MG tablet Take 1 tablet (2 mg total) by mouth daily. 90 tablet 2   Evolocumab (REPATHA SURECLICK) 140 MG/ML SOAJ Inject 140 mg into the skin every 14 (fourteen) days. 2 mL 12   ezetimibe (ZETIA) 10 MG tablet TAKE 1 TABLET BY MOUTH DAILY. 90 tablet 2  hydrOXYzine (ATARAX/VISTARIL) 25 MG tablet Take 25 mg by mouth as needed for anxiety.     Multiple Vitamins-Minerals (PRESERVISION AREDS 2 PO) Take by mouth.     nitroGLYCERIN (NITROLINGUAL) 0.4 MG/SPRAY spray Place 1 spray under the tongue every 5 (five) minutes x 3 doses as needed for chest pain. 12 g 12   Omega-3 Fatty Acids (FISH OIL OMEGA-3 PO) Take 2 capsules by mouth at bedtime.     polyethylene glycol (MIRALAX / GLYCOLAX) packet Take 17 g by mouth daily as needed for mild constipation or moderate constipation.     potassium chloride (KLOR-CON M) 10 MEQ tablet TAKE 1 TABLET BY MOUTH DAILY 90 tablet 3   predniSONE (DELTASONE) 50 MG tablet Take one tablet 13 hours, 7 hours, and 1 hour prior to scan. 3 tablet 0   sodium chloride HYPERTONIC 3 % nebulizer solution Take 5 mLs by nebulization every 4 (four) hours as needed.     SYMBICORT 160-4.5 MCG/ACT inhaler SMARTSIG:2 Inhalation Via Inhaler Twice Daily     venlafaxine XR (EFFEXOR-XR) 150 MG 24 hr capsule Take 150 mg by mouth daily.     furosemide (LASIX) 20 MG tablet Take 1 tablet (20 mg total) by mouth daily as needed (daily weight gain of 2 or more pounds). 45 tablet 3   No current facility-administered medications for this visit.      Physical Exam    VS:  BP (!) 140/92 (BP Location: Left Arm, Patient Position: Sitting, Cuff Size: Normal)   Pulse 72   Ht 5\' 2"  (1.575 m)   Wt 128 lb 6.4 oz (58.2 kg)   SpO2 96%   BMI 23.48 kg/m  , BMI Body mass index is 23.48 kg/m.    Orthostatic VS for the past 24 hrs:  BP- Lying Pulse- Lying BP- Sitting Pulse- Sitting BP- Standing at 0 minutes Pulse- Standing at 0 minutes  10/21/23 1554 (!) 168/98 75 152/90 78 137/86 83       GEN: Well nourished, well developed, in no acute distress. HEENT: normal. Neck: Supple, no JVD, carotid bruits, or masses. Cardiac: RRR, no murmurs, rubs, or gallops. No clubbing, cyanosis, edema.  Radials 2+/PT 2+ and equal bilaterally.  Respiratory:  Respirations regular and unlabored, clear to auscultation bilaterally. GI: Soft, nontender, nondistended, BS + x 4. MS: no deformity or atrophy. Skin: warm and dry, no rash. Neuro:  Strength and sensation are intact. Psych: Normal affect.  Accessory Clinical Findings    ECG personally reviewed by me today - EKG Interpretation Date/Time:  Thursday October 21 2023 15:53:41 EST Ventricular Rate:  72 PR Interval:  188 QRS Duration:  160 QT Interval:  444 QTC Calculation: 486 R Axis:   -74  Text Interpretation: Sinus rhythm with marked sinus arrhythmia Right bundle branch block Left anterior fascicular block Bifascicular block Minimal voltage criteria for LVH, may be normal variant ( R in aVL ) Confirmed by Nicolasa Ducking [phone]) on 10/21/2023 4:00:06 PM   - no acute changes.  Lab Results  Component Value Date   WBC 7.2 09/10/2023   HGB 13.2 09/10/2023   HCT 40.4 09/10/2023   MCV 90.8 09/10/2023   PLT 214 09/10/2023   Lab Results  Component Value Date   CREATININE 0.51 08/23/2023   BUN 32 (H) 08/23/2023   NA 139 08/23/2023   K 3.5 08/23/2023   CL 102 08/23/2023   CO2 29 08/23/2023   Lab Results  Component Value Date   ALT 25 08/17/2023   AST  50 (H) 08/17/2023   ALKPHOS 77  08/17/2023   BILITOT 0.8 08/17/2023   Lab Results  Component Value Date   CHOL 152 08/18/2023   HDL 110 08/18/2023   LDLCALC 33 08/18/2023   TRIG 45 08/18/2023   CHOLHDL 1.4 08/18/2023    Lab Results  Component Value Date   HGBA1C 5.7 (H) 08/17/2023       Assessment & Plan    1.  Orthostatic hypotension/primary hypertension: Despite coming off of losartan over a year ago, bisoprolol late last year, and reducing Lasix dosing throughout the week, she continues to have orthostatic lightheadedness and orthostatic hypotension documented on examination today, with a drop in her blood pressure from 168/98 lying to 137/68 standing.  She otherwise does well.  I have asked her to reduce Lasix to as needed only for weight gain of 2 pounds in 24 hours.  Discussed use of compression socks, though she notes that these hurt her legs and she will not likely be using.  In the setting of supine hypertension, she is a poor candidate for midodrine or liberalization of salt.  Discussed importance of vigilance when changing positions.  2.  Coronary artery disease: Known occlusion of the left circumflex by catheterization May 2023 with left to left and right to left collaterals and otherwise nonobstructive disease.  She has not been having any chest pain.  She tolerates water aerobics without symptoms or limitations.  As above, she is off of beta-blocker in the setting of orthostatic hypotension.  She does remain on aspirin, Plavix, Zetia, and Repatha.  3.  Chronic heart failure with improved ejection fraction: November 2024 echo showed EF of 50 to 55% with moderate LVH, grade 1 diastolic dysfunction, mild MR/AI, and mild to moderate TR.  Euvolemic on examination.  No longer on beta-blocker or ARB due to orthostatic hypotension.  Changing Lasix to as needed only in the setting of ongoing orthostasis.  She does weigh daily.  4.  Hyperlipidemia: LDL of 33 in November 2024 on Repatha.  In the setting of localized  skin reaction following injections, she contacted our pharmacy team and is being switched to Praluent.  5.  Carotid arterial disease: 1 to 39% right internal carotid artery stenosis with no significant stenosis on the left in June 2024.  Continue aspirin and lipid therapy.  6.  Disposition: Follow-up in 2 to 3 months or sooner if necessary.  Nicolasa Ducking, NP 10/21/2023, 5:41 PM

## 2023-10-22 ENCOUNTER — Other Ambulatory Visit: Payer: Self-pay | Admitting: Emergency Medicine

## 2023-10-22 DIAGNOSIS — D472 Monoclonal gammopathy: Secondary | ICD-10-CM

## 2023-10-25 MED ORDER — PRALUENT 150 MG/ML ~~LOC~~ SOAJ: 150.0000 mg | SUBCUTANEOUS | 12 refills | Status: DC

## 2023-10-26 ENCOUNTER — Other Ambulatory Visit (HOSPITAL_COMMUNITY): Payer: Self-pay | Admitting: *Deleted

## 2023-10-29 ENCOUNTER — Telehealth (HOSPITAL_COMMUNITY): Payer: Self-pay | Admitting: *Deleted

## 2023-10-29 NOTE — Telephone Encounter (Signed)
Gave pt instructions for Amyloid study.

## 2023-11-11 ENCOUNTER — Ambulatory Visit (HOSPITAL_COMMUNITY): Payer: Medicare Other | Attending: Internal Medicine

## 2023-11-11 DIAGNOSIS — D472 Monoclonal gammopathy: Secondary | ICD-10-CM | POA: Diagnosis present

## 2023-11-11 LAB — MYOCARDIAL AMYLOID PLANAR & SPECT: H/CL Ratio: 1.5

## 2023-11-11 MED ORDER — TECHNETIUM TC 99M PYROPHOSPHATE
21.7000 | Freq: Once | INTRAVENOUS | Status: AC
  Administered 2023-11-11: 21.7 via INTRAVENOUS

## 2023-11-12 ENCOUNTER — Other Ambulatory Visit: Payer: Self-pay | Admitting: Emergency Medicine

## 2023-11-12 ENCOUNTER — Encounter: Payer: Self-pay | Admitting: Cardiovascular Disease

## 2023-11-12 DIAGNOSIS — E854 Organ-limited amyloidosis: Secondary | ICD-10-CM

## 2023-11-15 ENCOUNTER — Telehealth: Payer: Self-pay | Admitting: Internal Medicine

## 2023-11-25 ENCOUNTER — Other Ambulatory Visit: Payer: Self-pay | Admitting: Cardiovascular Disease

## 2023-11-25 NOTE — Progress Notes (Deleted)
 Cardiology Clinic Note   Date: 11/25/2023 ID: Jaylnn, Ullery October 17, 1935, MRN [id number]  Primary Cardiologist:  Julien Nordmann, MD  Chief Complaint   Lindsey [name] is a 88 y.o. female who presents to the clinic today for ***  Patient Profile   Lindsey [name] is followed by *** for the history outlined below.      Past medical history significant for: CAD. R/LHC 03/03/2022: Severe single-vessel CAD with occlusion of mid LCx with left to left and right to left collaterals.  Otherwise nonobstructive CAD involving LAD and RCA.  Moderately reduced LVEF with mid/apical inferior akinesis.  EF 35 to 45%.  Mildly elevated left heart filling pressure.  Normal Fick cardiac output/index.  Recommend medical therapy and aggressive secondary prevention. Chronic combined systolic and diastolic heart failure. Echo 08/18/2023: EF 50 to 55%.  No RWMA.  Moderate LVH.  Grade I DD.  Low normal RV function.  Normal PA pressure.  Mild MR.  Mild to moderate TR.  Mild AI.  Aortic valve sclerosis without stenosis. Palpitations/PACs. 3-day Holter 11/05/2021: Average HR 55 bpm.  5 runs of SVT/atrial tachycardia, fastest/longest 10 beats max rate 143 bpm, average 136 bpm.  Frequent PACs (12.9%).  Patient triggered events associated with PACs. Carotid artery stenosis. Carotid artery duplex 03/31/2023: Right ICA 1 to 39%.  Left ICA with no evidence of stenosis. PAD. Hypertension. Hyperlipidemia. Lipid panel 11/03/2023: LDL 53, HDL 104, TG 45, total 165.. Right DVT. S/p IVC filter. COPD. Migraines. TIA.  In summary, patient was admitted to [facility] on 03/01/2022 for acute on chronic heart failure and NSTEMI.  High-sensitivity troponin peaked at 1044, BNP 1740.  Chest x-ray demonstrated cardiomegaly with mild diffuse interstitial opacity and small pleural effusions consistent with edema.  EKG showed PACs, LAFB, chronic RBBB, T wave inversions in inferior lateral leads.  She underwent R/LHC which showed severe  single-vessel CAD with complete occlusion of mid LCx with left to left and right to left collaterals (further details above).  Recommendation for medical management with DAPT and escalation of GDMT.  Echo showed EF 40 to 45%, RWMA, Grade III DD.  She was diuresed and discharged on 03/04/2022.  She underwent admission at [facility] on 04/11/2022 for shortness of breath and hypoxia.  Patient reported DOE and lower extremity edema as well as cough with peak flow.  In ED patient's BP was elevated and SpO2 80% on room air prior to starting 2 L of O2 therapy to maintain SpO2 in the 90s.  Troponin 30, BNP 985.  AST/ALT 95/80.  Urinalysis with large leukocytes.  EKG without ST or T wave changes.  Chest x-ray showed cardiomegaly with vascular congestion and diffuse bilateral interstitial groundglass opacities suspicious for pulmonary edema.  She underwent IV diuresis and symptomatically improved.  She was transitioned to oral Lasix on discharge.  She was continued on losartan and bisoprolol with recommendation for daily weights and low-sodium diet and fluid restriction.  She was discharged on 04/14/2022.  Patient presented to [facility] ED on 05/07/2022 with complaints of chest pain.  Chest wall was tender to palpation and no pain at rest.  No shortness of breath.  She was noted to be hypertensive with BP 175/106.  Troponin 22>> 24 not consistent with ACS.  EKG showed no evidence of ischemia.  Chest x-ray was clear.  She was discharged home to follow-up with cardiology as an outpatient.  She was seen in the office on 02/11/2023 by Charlsie Quest, NP for complaints of shortness of  breath and dizziness.  She had an episode of chest discomfort while walking up the stairs the evening prior after eating a meal higher in sodium than usual with pain in the epigastrium.  She reported she had been off of losartan for a minimum a few weeks.  Labs drawn for further evaluation.  D-dimer slightly elevated at 0.63, troponin 14, BNP normal for age,  stable electrolytes and kidney function.  CTA chest PE protocol was ordered for further evaluation of elevated D-dimer.  CTA demonstrated no evidence of PE and no acute abnormality in the chest.  Patient was seen in follow-up in November 2024 and reported persistent positional dizziness.  She was noted to have positive orthostatic hypotension.  She continued to hold bisoprolol secondary to dizziness with no increase in palpitations.  She reported local reaction to Repatha and requested to follow-up with Pharm.D. lipid clinic.  Lasix was decreased to every other day and she was encouraged to wear compression stockings.  She met with lipid clinic and decision was made to change to Praluent.  Patient was last seen in the office by Ward Givens, NP, NP on 10/21/2023 for routine follow-up.  She continued to have orthostatic lightheadedness and hypotension with a drop in her blood pressure from 168/98 lying to 137/68 standing.  She was instructed to reduce Lasix to as needed for weight gain.  She noted compression socks hurt her legs and she was unlikely to utilize these.  She was a poor candidate for midodrine or liberalization of salt given her supine hypertension.  No other changes were made.     History of Present Illness    Today, patient ***  CAD/chest pain R/LHC May 2023 showed severe single-vessel CAD with occlusion of mid LCx with left to left and right to left collaterals otherwise nonobstructive CAD involving LAD and RCA.  Patient*** -Continue aspirin, Plavix, Praluent, Zetia, as needed SL NTG.  No longer taking bisoprolol secondary to dizziness.   Chronic combined systolic and diastolic heart failure Echo November 2024 showed EF 50 to 55%, no RWMA, moderate LVH, Grade I DD, mild MR/AI, mild to moderate TR.  Patient denies lower extremity edema. Weight is stable.  Euvolemic and well compensated on exam.*** -As needed Lasix for weight gain.  Not on this prolonged or losartan secondary to  dizziness. -Continue daily weights.   Palpitations/PACs 3-day Holter February 2023 showed 5 runs of SVT/A. tach, frequent PACs (12.9%).  Patient denies palpitations. She has been holding bisoprolol secondary to dizziness.*** -Continue to monitor.  Patient instructed to contact the office if palpitations recur.   Hypertension BP today***.  -Continue Cardura.   Hyperlipidemia LDL January 2025 53, at goal. Patient recently changed to Praluent secondary to localized reaction to Repatha. -Continue Zetia and Praluent.   Orthostatic Hypotension/Dizziness Dizziness upon standing and during ambulation, more frequent recently.  Patient cannot tolerate compression socks as they hurt her legs.*** -Lasix as needed for weight gain***  ROS: All other systems reviewed and are otherwise negative except as noted in History of Present Illness.  EKGs/Labs Reviewed        08/17/2023: ALT 25; AST 50 08/23/2023: BUN 32; Creatinine, Ser 0.51; Potassium 3.5; Sodium 139   09/10/2023: Hemoglobin 13.2; WBC 7.2   No results found for requested labs within last 365 days.   02/11/2023: B Natriuretic Peptide 245.8  ***  Risk Assessment/Calculations    {Does this patient have ATRIAL FIBRILLATION?:[phone]} No BP recorded.  {Refresh Note OR Click here to enter BP  :  1}***        Physical Exam    VS:  There were no vitals taken for this visit. , BMI There is no height or weight on file to calculate BMI.  GEN: Well nourished, well developed, in no acute distress. Neck: No JVD or carotid bruits. Cardiac: *** RRR. No murmurs. No rubs or gallops.   Respiratory:  Respirations regular and unlabored. Clear to auscultation without rales, wheezing or rhonchi. GI: Soft, nontender, nondistended. Extremities: Radials/DP/PT 2+ and equal bilaterally. No clubbing or cyanosis. No edema ***  Skin: Warm and dry, no rash. Neuro: Strength intact.  Assessment & Plan   ***  Disposition: ***     {Are you ordering a  CV Procedure (e.g. stress test, cath, DCCV, TEE, etc)?   Press F2        :[id number]}   Signed, Etta Grandchild. Najeeb Uptain, DNP, NP-C

## 2023-11-29 ENCOUNTER — Ambulatory Visit: Payer: Medicare Other | Attending: Student | Admitting: Student

## 2023-12-03 ENCOUNTER — Other Ambulatory Visit: Payer: Self-pay

## 2023-12-03 ENCOUNTER — Encounter: Payer: Self-pay | Admitting: Emergency Medicine

## 2023-12-03 ENCOUNTER — Encounter: Payer: Self-pay | Admitting: Nurse Practitioner

## 2023-12-03 ENCOUNTER — Emergency Department: Payer: Medicare Other

## 2023-12-03 ENCOUNTER — Ambulatory Visit: Payer: Medicare Other | Attending: Nurse Practitioner | Admitting: Nurse Practitioner

## 2023-12-03 VITALS — BP 130/82 | HR 73 | Ht 62.0 in | Wt 126.4 lb

## 2023-12-03 DIAGNOSIS — G20C Parkinsonism, unspecified: Principal | ICD-10-CM | POA: Insufficient documentation

## 2023-12-03 DIAGNOSIS — Z79899 Other long term (current) drug therapy: Secondary | ICD-10-CM | POA: Diagnosis not present

## 2023-12-03 DIAGNOSIS — I25810 Atherosclerosis of coronary artery bypass graft(s) without angina pectoris: Secondary | ICD-10-CM

## 2023-12-03 DIAGNOSIS — R260 Ataxic gait: Secondary | ICD-10-CM | POA: Insufficient documentation

## 2023-12-03 DIAGNOSIS — I5032 Chronic diastolic (congestive) heart failure: Secondary | ICD-10-CM

## 2023-12-03 DIAGNOSIS — R42 Dizziness and giddiness: Secondary | ICD-10-CM | POA: Insufficient documentation

## 2023-12-03 DIAGNOSIS — Z853 Personal history of malignant neoplasm of breast: Secondary | ICD-10-CM | POA: Insufficient documentation

## 2023-12-03 DIAGNOSIS — I951 Orthostatic hypotension: Secondary | ICD-10-CM

## 2023-12-03 DIAGNOSIS — I110 Hypertensive heart disease with heart failure: Secondary | ICD-10-CM | POA: Insufficient documentation

## 2023-12-03 DIAGNOSIS — Z96653 Presence of artificial knee joint, bilateral: Secondary | ICD-10-CM | POA: Insufficient documentation

## 2023-12-03 DIAGNOSIS — E8589 Other amyloidosis: Secondary | ICD-10-CM | POA: Diagnosis not present

## 2023-12-03 DIAGNOSIS — E854 Organ-limited amyloidosis: Secondary | ICD-10-CM

## 2023-12-03 DIAGNOSIS — I10 Essential (primary) hypertension: Secondary | ICD-10-CM | POA: Diagnosis not present

## 2023-12-03 DIAGNOSIS — I5030 Unspecified diastolic (congestive) heart failure: Secondary | ICD-10-CM | POA: Insufficient documentation

## 2023-12-03 DIAGNOSIS — I2510 Atherosclerotic heart disease of native coronary artery without angina pectoris: Secondary | ICD-10-CM | POA: Diagnosis not present

## 2023-12-03 DIAGNOSIS — J4489 Other specified chronic obstructive pulmonary disease: Secondary | ICD-10-CM | POA: Insufficient documentation

## 2023-12-03 DIAGNOSIS — I43 Cardiomyopathy in diseases classified elsewhere: Secondary | ICD-10-CM | POA: Diagnosis not present

## 2023-12-03 DIAGNOSIS — I5020 Unspecified systolic (congestive) heart failure: Secondary | ICD-10-CM

## 2023-12-03 DIAGNOSIS — Z7902 Long term (current) use of antithrombotics/antiplatelets: Secondary | ICD-10-CM | POA: Diagnosis not present

## 2023-12-03 DIAGNOSIS — G43909 Migraine, unspecified, not intractable, without status migrainosus: Secondary | ICD-10-CM | POA: Insufficient documentation

## 2023-12-03 DIAGNOSIS — Z7982 Long term (current) use of aspirin: Secondary | ICD-10-CM | POA: Insufficient documentation

## 2023-12-03 DIAGNOSIS — R531 Weakness: Secondary | ICD-10-CM | POA: Diagnosis present

## 2023-12-03 DIAGNOSIS — Z86718 Personal history of other venous thrombosis and embolism: Secondary | ICD-10-CM | POA: Insufficient documentation

## 2023-12-03 DIAGNOSIS — R002 Palpitations: Secondary | ICD-10-CM

## 2023-12-03 DIAGNOSIS — E785 Hyperlipidemia, unspecified: Secondary | ICD-10-CM

## 2023-12-03 DIAGNOSIS — I739 Peripheral vascular disease, unspecified: Secondary | ICD-10-CM | POA: Diagnosis not present

## 2023-12-03 LAB — BASIC METABOLIC PANEL
Anion gap: 11 (ref 5–15)
BUN: 30 mg/dL — ABNORMAL HIGH (ref 8–23)
CO2: 27 mmol/L (ref 22–32)
Calcium: 9 mg/dL (ref 8.9–10.3)
Chloride: 100 mmol/L (ref 98–111)
Creatinine, Ser: 0.68 mg/dL (ref 0.44–1.00)
GFR, Estimated: >60 mL/min (ref >60)
Glucose, Bld: 95 mg/dL (ref 70–99)
Potassium: 3.5 mmol/L (ref 3.5–5.1)
Sodium: 138 mmol/L (ref 135–145)

## 2023-12-03 LAB — CBG MONITORING, ED: Glucose-Capillary: 63 mg/dL — ABNORMAL LOW (ref 70–99)

## 2023-12-03 LAB — CBC
HCT: 42.4 % (ref 36.0–46.0)
Hemoglobin: 14 g/dL (ref 12.0–15.0)
MCH: 29.6 pg (ref 26.0–34.0)
MCHC: 33 g/dL (ref 30.0–36.0)
MCV: 89.6 fL (ref 80.0–100.0)
Platelets: 220 10*3/uL (ref 150–400)
RBC: 4.73 MIL/uL (ref 3.87–5.11)
RDW: 14 % (ref 11.5–15.5)
WBC: 7.5 10*3/uL (ref 4.0–10.5)
nRBC: 0 % (ref 0.0–0.2)

## 2023-12-03 NOTE — Progress Notes (Signed)
 Office Visit    Patient Name: Lindsey [name] Date of Encounter: 12/03/2023  Primary Care Provider:  Barbette Reichmann, MD Primary Cardiologist:  Julien Nordmann, MD  Chief Complaint    88 y.o. female with a history of CAD, chronic heart failure with improved ejection fraction, hypertension, hyperlipidemia, paroxysmal atrial tachycardia, peripheral arterial disease, bifascicular block, diastolic dysfunction, COPD, venous insufficiency with lower extremity edema, right lower extremity DVT status post IVC filter, and bronchiectasis, who presents for follow-up related to orthostatic hypotension.   Past Medical History  Subjective   Past Medical History:  Diagnosis Date   Anemia    Atrial tachycardia (HCC)    Breast cancer (HCC)    Bronchiectasis (HCC) 06/2007   CAD (coronary artery disease)    a. 02/2022 Cath: LM nl, LAD 34m, D23 40, LCX 146m/d w/ L->L and R->L collats, RCA 46m-->Med Rx.   Cardiac amyloidosis (HCC)    a. 11/2023 PYP: + radiotracer uptake suggestive of ATTR.   Chronic Dizziness    Colitis    COPD (chronic obstructive pulmonary disease) (HCC)    Depression    Diastolic dysfunction    a. 12/2019 Echo: EF 60-65%, no rwma, GrII DD. Nl RV size/fxn. Mild MR/AI.   DVT (deep venous thrombosis) (HCC)    after her right knee surgery.    Fibromyalgia    Heart failure with improved ejection fraction (HFimpEF) (HCC)    a. 02/2022 Echo: EF 40-45%, GrIII DD; b. 03/2023 Echo: EF 55-60%; c. 08/2023 Echo: EF 50-55%, mod LVH, GrI DD, low-nl RV fxn, ml PASP, mild MR/AI, mild-mod TR, AoV sclerosis.   Ischemic cardiomyopathy    a. 02/2022 Echo: EF 40-45%; b. 03/2023 Echo: EF 55-60%; c. 08/2023 Echo: EF 50-55%.   Labile Hypertension    Migraine headache with aura    Osteoarthritis    Osteoporosis    PAD (peripheral artery disease) (HCC)    Pneumonia    [place] 1952   Stenosis of carotid artery    Past Surgical History:  Procedure Laterality Date   ABDOMINAL HYSTERECTOMY     BACK  SURGERY     BILATERAL TOTAL MASTECTOMY WITH AXILLARY LYMPH NODE DISSECTION     BREAST SURGERY     CARPAL TUNNEL RELEASE     bilateral    COLONOSCOPY WITH PROPOFOL N/A 08/02/2015   Procedure: COLONOSCOPY WITH PROPOFOL;  Surgeon: Scot Jun, MD;  Location[facility] ENDOSCOPY;  Service: Endoscopy;  Laterality: N/A;   FINGER TENDON REPAIR     HERNIA REPAIR     REPLACEMENT TOTAL KNEE     right knee    RIGHT/LEFT HEART CATH AND CORONARY ANGIOGRAPHY N/A 03/03/2022   Procedure: RIGHT/LEFT HEART CATH AND CORONARY ANGIOGRAPHY;  Surgeon: Yvonne Kendall, MD;  Location: [facility] INVASIVE CV LAB;  Service: Cardiovascular;  Laterality: N/A;   SQUAMOUS CELL CARCINOMA EXCISION     TONSILLECTOMY     TOTAL KNEE ARTHROPLASTY Left 09/19/2020   Procedure: TOTAL KNEE ARTHROPLASTY;  Surgeon: Kennedy Bucker, MD;  Location: [facility] ORS;  Service: Orthopedics;  Laterality: Left;   UMBILICAL HERNIA REPAIR     VAGINAL HYSTERECTOMY      Allergies  Allergies  Allergen Reactions   Ivp Dye [Iodinated Contrast Media] Anaphylaxis   Charentais Melon (French Melon) Other (See Comments)    All melons cause stomach pain and upset   Codeine Other (See Comments)    Dizziness  Dizziness   Cucumber Extract    Demeclocycline Other (See Comments)    Stomach  upset    Galcanezumab-Gnlm Itching   Repatha [Evolocumab] Dermatitis    LUMP AT AREA OF SHOT AND ITCHING   Sulfa Antibiotics Hives   Tegaderm Ag Mesh [Silver]    Telmisartan     dizziness Other reaction(s): Dizziness dizziness   Tetracyclines & Related     Stomach upset    Wild Lettuce Extract (Lactuca Virosa) Other (See Comments)    Stomach upset and pain      History of Present Illness      88 y.o. y/o female with a history of CAD, chronic heart failure with improved ejection fraction, hypertension, hyperlipidemia, paroxysmal atrial tachycardia, peripheral arterial disease, diastolic dysfunction, bifascicular block, COPD, venous insufficiency with lower  extremity edema, right lower extremity DVT status post IVC filter, and bronchiectasis.  In May 2023, patient was admitted due to heart failure non-STEMI with troponin of 1044.  Diagnostic catheterization showed severe single-vessel CAD with complete occlusion of the mid left circumflex with left to left and right to left collaterals.  Medical therapy was advised.  Echo at the time showed an EF of 40 to 45% with grade 3 diastolic dysfunction.  Following adequate diuresis, she was subsequently discharged but required readmission in July 2023 due to heart failure with further escalation of diuretics.  GDMT historically limited by soft blood pressures with discontinuation of losartan and subsequently bisoprolol.  Most recent echo November 2024 showed EF of 50 to 55% with moderate LVH, grade 1 diastolic dysfunction, and mild to moderate TR.     Ms. Rottman was last seen in cardiology clinic in 10/2023 in thte setting of ongoing hypotension despite discontinuation of bisoprolol and losartan, and reducing lasix dosing throughout the week.  She was orthostatic on examination however, in the setting of resting hypertension continued conservative management and vigilance with position changes was advised.  In the setting of evaluation for monoclonal gammopathy with oncology, after discussion with Dr. Mariah Milling, recommendation was made for PYP scan.  This was performed in February 6 and was notable for myocardial uptake of radiotracer and suggestive of ATTR amyloidosis.  Referral was made to advanced heart failure clinic.  Over the last several months, Ms. Koopman has noted more fatigue than usual.  An example is that she typically wakes up and makes her bed right away but more recently, has opted not to.  She has not been experiencing any chest pain or dyspnea.  She rarely takes Lasix and orthostatic symptoms have more or less resolved completely.  She denies palpitations, PND, orthopnea, dizziness, syncope, edema, or early  satiety.  She is most bothered by left leg trembling, and notes that there has been concern about the development of Parkinson's.  We did discuss her her PYP scan and the diagnosis of cardiac amyloid.  She has f/u w/ Dr. Gala Romney in 2 wks. Objective  Home Medications    Current Outpatient Medications  Medication Sig Dispense Refill   acetaminophen (TYLENOL) 500 MG tablet Take 500 mg by mouth at bedtime as needed for moderate pain.     albuterol (VENTOLIN HFA) 108 (90 Base) MCG/ACT inhaler Inhale 2 puffs into the lungs every 6 (six) hours as needed for wheezing or shortness of breath.     Alirocumab (PRALUENT) 150 MG/ML SOAJ Inject 1 mL (150 mg total) into the skin every 14 (fourteen) days. 2 mL 12   ARIPiprazole (ABILIFY) 2 MG tablet Take 2 mg by mouth daily.      aspirin EC 81 MG tablet Take 1 tablet (81  mg total) by mouth daily. Swallow whole.     clopidogrel (PLAVIX) 75 MG tablet TAKE ONE TABLET EACH MORNING WITH BREAKFAST 90 tablet 3   cyanocobalamin (VITAMIN B12) 1000 MCG tablet Take 1 tablet (1,000 mcg total) by mouth daily.     cyclobenzaprine (FLEXERIL) 5 MG tablet Take 5 mg by mouth at bedtime as needed.     doxazosin (CARDURA) 2 MG tablet Take 1 tablet (2 mg total) by mouth daily. 90 tablet 2   ezetimibe (ZETIA) 10 MG tablet TAKE 1 TABLET BY MOUTH DAILY. 90 tablet 0   furosemide (LASIX) 20 MG tablet Take 1 tablet (20 mg total) by mouth daily as needed (daily weight gain of 2 or more pounds). 45 tablet 3   hydrOXYzine (ATARAX/VISTARIL) 25 MG tablet Take 25 mg by mouth as needed for anxiety.     Multiple Vitamins-Minerals (PRESERVISION AREDS 2 PO) Take by mouth.     nitroGLYCERIN (NITROLINGUAL) 0.4 MG/SPRAY spray Place 1 spray under the tongue every 5 (five) minutes x 3 doses as needed for chest pain. 12 g 12   Omega-3 Fatty Acids (FISH OIL OMEGA-3 PO) Take 2 capsules by mouth at bedtime.     polyethylene glycol (MIRALAX / GLYCOLAX) packet Take 17 g by mouth daily as needed for mild  constipation or moderate constipation.     potassium chloride (KLOR-CON M) 10 MEQ tablet TAKE 1 TABLET BY MOUTH DAILY 90 tablet 3   predniSONE (DELTASONE) 50 MG tablet Take one tablet 13 hours, 7 hours, and 1 hour prior to scan. 3 tablet 0   sodium chloride HYPERTONIC 3 % nebulizer solution Take 5 mLs by nebulization every 4 (four) hours as needed.     SYMBICORT 160-4.5 MCG/ACT inhaler SMARTSIG:2 Inhalation Via Inhaler Twice Daily     venlafaxine XR (EFFEXOR-XR) 150 MG 24 hr capsule Take 150 mg by mouth daily.     No current facility-administered medications for this visit.     Physical Exam    VS:  BP 130/82 (BP Location: Left Arm, Patient Position: Sitting, Cuff Size: Normal)   Pulse 73   Ht 5\' 2"  (1.575 m)   Wt 126 lb 6.4 oz (57.3 kg)   SpO2 95%   BMI 23.12 kg/m  , BMI Body mass index is 23.12 kg/m.       GEN: Well nourished, well developed, in no acute distress. HEENT: normal. Neck: Supple, no JVD, carotid bruits, or masses. Cardiac: RRR, no murmurs, rubs, or gallops. No clubbing, cyanosis, edema.  Radials 2+/PT 2+ and equal bilaterally.  Respiratory:  Respirations regular and unlabored, clear to auscultation bilaterally. GI: Soft, nontender, nondistended, BS + x 4. MS: no deformity or atrophy. Skin: warm and dry, no rash. Neuro:  Strength and sensation are intact. Psych: Normal affect.  Accessory Clinical Findings    ECG personally reviewed by me today - EKG Interpretation Date/Time:  Friday December 03 2023 13:55:24 EST Ventricular Rate:  73 PR Interval:  192 QRS Duration:  144 QT Interval:  432 QTC Calculation: 475 R Axis:   -79  Text Interpretation: Normal sinus rhythm Right bundle branch block Left anterior fascicular block Bifascicular block Minimal voltage criteria for LVH, may be normal variant ( R in aVL ) Septal infarct , age undetermined Confirmed by Nicolasa Ducking [phone]) on 12/03/2023 2:05:17 PM  - no acute changes.  Lab Results  Component Value Date    WBC 7.2 09/10/2023   HGB 13.2 09/10/2023   HCT 40.4 09/10/2023   MCV  90.8 09/10/2023   PLT 214 09/10/2023   Lab Results  Component Value Date   CREATININE 0.51 08/23/2023   BUN 32 (H) 08/23/2023   NA 139 08/23/2023   K 3.5 08/23/2023   CL 102 08/23/2023   CO2 29 08/23/2023   Lab Results  Component Value Date   ALT 25 08/17/2023   AST 50 (H) 08/17/2023   ALKPHOS 77 08/17/2023   BILITOT 0.8 08/17/2023   Lab Results  Component Value Date   CHOL 152 08/18/2023   HDL 110 08/18/2023   LDLCALC 33 08/18/2023   TRIG 45 08/18/2023   CHOLHDL 1.4 08/18/2023    Lab Results  Component Value Date   HGBA1C 5.7 (H) 08/17/2023      Assessment & Plan    1.  Orthostatic Hypotension/Primary Hypertension:  Prev on bisoprolol, losartan and was also using Lasix most days of the week.  She has been off of bisoprolol and losartan since late last year and since her visit in January, has been taking Lasix very sparingly, with more or less complete resolution of orthostasis.  2.  Coronary artery disease: Known occlusion of the left circumflex by catheterization May 2023 with the left and right to left collaterals and otherwise nonobstructive disease.  She has done well without chest pain or dyspnea.  She remains on aspirin, Plavix, Zetia, and Praluent.  3.  Chronic heart failure with improved ejection fraction/abnormal PYP scan: Echo November 2024 showed an EF of 50 to 55% with moderate LVH, grade 1 diastolic dysfunction, mild MR/AI, and mild to moderate TR.  Recent PYP scan due to elevated M protein noted on workup for monoclonal gammopathy, with abnormal radiotracer uptake suggestive of ATTR.  We discussed results today and plan for follow-up with heart failure clinic in early March.  She is euvolemic on examination.  Defer additional testing to heart failure team.  4.  Hyperlipidemia: LDL 30 08 August 2023 on Repatha therapy.  Due to local injection site reactions, she was switched from our  pharmacy team to Praluent, and is tolerating.  5.  Carotid arterial disease: Mild disease on ultrasound in June 2024.  She remains on aspirin and Praluent therapy.  6.  Disposition: Patient will follow-up with advanced heart failure team in 2 weeks.  Nicolasa Ducking, NP 12/03/2023, 6:47 PM

## 2023-12-03 NOTE — ED Triage Notes (Signed)
 Patient brought in by [facility] sudden onset of weakness,  leaning on right side, difficulty walking.  Weakness has improved per patient but still leaning to one side.

## 2023-12-03 NOTE — Patient Instructions (Addendum)
 Medication Instructions:  No changes *If you need a refill on your cardiac medications before your next appointment, please call your pharmacy*   Lab Work: None ordered If you have labs (blood work) drawn today and your tests are completely normal, you will receive your results only by: MyChart Message (if you have MyChart) OR A paper copy in the mail If you have any lab test that is abnormal or we need to change your treatment, we will call you to review the results.   Testing/Procedures: None ordered   Follow-Up: At [facility], you and your health needs are our priority.  As part of our continuing mission to provide you with exceptional heart care, we have created designated Provider Care Teams.  These Care Teams include your primary Cardiologist (physician) and Advanced Practice Providers (APPs -  Physician Assistants and Nurse Practitioners) who all work together to provide you with the care you need, when you need it.  We recommend signing up for the patient portal called "MyChart".  Sign up information is provided on this After Visit Summary.  MyChart is used to connect with patients for Virtual Visits (Telemedicine).  Patients are able to view lab/test results, encounter notes, upcoming appointments, etc.  Non-urgent messages can be sent to your provider as well.   To learn more about what you can do with MyChart, go to [email or web address].    Your next appointment:   Keep follow up as scheduled with Dr. Gala Romney

## 2023-12-03 NOTE — ED Notes (Signed)
 To ED via [facility] from [facility] [place] c/o weakness today. Was at play and was leaning towards right side.

## 2023-12-04 ENCOUNTER — Encounter: Payer: Self-pay | Admitting: Radiology

## 2023-12-04 ENCOUNTER — Emergency Department

## 2023-12-04 ENCOUNTER — Observation Stay
Admission: EM | Admit: 2023-12-04 | Discharge: 2023-12-06 | Disposition: A | Discharge status: Skilled Nursing Facility | Payer: Medicare Other | Attending: Obstetrics and Gynecology | Admitting: Obstetrics and Gynecology

## 2023-12-04 DIAGNOSIS — G43909 Migraine, unspecified, not intractable, without status migrainosus: Secondary | ICD-10-CM | POA: Diagnosis present

## 2023-12-04 DIAGNOSIS — I951 Orthostatic hypotension: Secondary | ICD-10-CM

## 2023-12-04 DIAGNOSIS — G20C Parkinsonism, unspecified: Secondary | ICD-10-CM | POA: Diagnosis not present

## 2023-12-04 DIAGNOSIS — I10 Essential (primary) hypertension: Secondary | ICD-10-CM | POA: Diagnosis present

## 2023-12-04 DIAGNOSIS — J4489 Other specified chronic obstructive pulmonary disease: Secondary | ICD-10-CM | POA: Diagnosis present

## 2023-12-04 DIAGNOSIS — R270 Ataxia, unspecified: Secondary | ICD-10-CM

## 2023-12-04 DIAGNOSIS — R42 Dizziness and giddiness: Principal | ICD-10-CM

## 2023-12-04 DIAGNOSIS — R2681 Unsteadiness on feet: Secondary | ICD-10-CM | POA: Diagnosis not present

## 2023-12-04 DIAGNOSIS — I739 Peripheral vascular disease, unspecified: Secondary | ICD-10-CM | POA: Diagnosis present

## 2023-12-04 DIAGNOSIS — Z95828 Presence of other vascular implants and grafts: Secondary | ICD-10-CM

## 2023-12-04 DIAGNOSIS — Z86718 Personal history of other venous thrombosis and embolism: Secondary | ICD-10-CM

## 2023-12-04 DIAGNOSIS — I5030 Unspecified diastolic (congestive) heart failure: Secondary | ICD-10-CM

## 2023-12-04 DIAGNOSIS — E854 Organ-limited amyloidosis: Secondary | ICD-10-CM

## 2023-12-04 DIAGNOSIS — I2510 Atherosclerotic heart disease of native coronary artery without angina pectoris: Secondary | ICD-10-CM | POA: Insufficient documentation

## 2023-12-04 DIAGNOSIS — J479 Bronchiectasis, uncomplicated: Secondary | ICD-10-CM

## 2023-12-04 LAB — URINALYSIS, ROUTINE W REFLEX MICROSCOPIC
Bilirubin Urine: NEGATIVE
Glucose, UA: NEGATIVE mg/dL
Hgb urine dipstick: NEGATIVE
Ketones, ur: NEGATIVE mg/dL
Nitrite: NEGATIVE
Protein, ur: NEGATIVE mg/dL
Specific Gravity, Urine: 1.01 (ref 1.005–1.030)
pH: 6 (ref 5.0–8.0)

## 2023-12-04 LAB — URINE DRUG SCREEN, QUALITATIVE (ARMC ONLY)
Amphetamines, Ur Screen: NOT DETECTED
Barbiturates, Ur Screen: NOT DETECTED
Benzodiazepine, Ur Scrn: NOT DETECTED
Cannabinoid 50 Ng, Ur ~~LOC~~: NOT DETECTED
Cocaine Metabolite,Ur ~~LOC~~: NOT DETECTED
MDMA (Ecstasy)Ur Screen: NOT DETECTED
Methadone Scn, Ur: NOT DETECTED
Opiate, Ur Screen: NOT DETECTED
Phencyclidine (PCP) Ur S: NOT DETECTED
Tricyclic, Ur Screen: NOT DETECTED

## 2023-12-04 LAB — LIPID PANEL
Cholesterol: 144 mg/dL (ref 0–200)
HDL: 102 mg/dL (ref >40)
LDL Cholesterol: 33 mg/dL (ref 0–99)
Total CHOL/HDL Ratio: 1.4 ratio
Triglycerides: 47 mg/dL (ref <150)
VLDL: 9 mg/dL (ref 0–40)

## 2023-12-04 MED ORDER — MOMETASONE FURO-FORMOTEROL FUM 200-5 MCG/ACT IN AERO
2.0000 | INHALATION_SPRAY | Freq: Two times a day (BID) | RESPIRATORY_TRACT | Status: DC
  Administered 2023-12-04: 2 via RESPIRATORY_TRACT
  Filled 2023-12-04: qty 8.8

## 2023-12-04 MED ORDER — CYCLOBENZAPRINE HCL 10 MG PO TABS
5.0000 mg | ORAL_TABLET | Freq: Three times a day (TID) | ORAL | Status: DC | PRN
  Administered 2023-12-04: 5 mg via ORAL
  Filled 2023-12-04: qty 1

## 2023-12-04 MED ORDER — ACETAMINOPHEN 325 MG RE SUPP: 650.0000 mg | RECTAL | Status: DC | PRN

## 2023-12-04 MED ORDER — STROKE: EARLY STAGES OF RECOVERY BOOK: Freq: Once | Status: AC

## 2023-12-04 MED ORDER — NITROGLYCERIN 0.4 MG SL SUBL: 0.4000 mg | SUBLINGUAL_TABLET | SUBLINGUAL | Status: DC | PRN

## 2023-12-04 MED ORDER — ACETAMINOPHEN 325 MG PO TABS
650.0000 mg | ORAL_TABLET | ORAL | Status: DC | PRN
  Administered 2023-12-04 – 2023-12-06 (×3): 650 mg via ORAL
  Filled 2023-12-04 (×3): qty 2

## 2023-12-04 MED ORDER — CLOPIDOGREL BISULFATE 75 MG PO TABS
75.0000 mg | ORAL_TABLET | Freq: Every day | ORAL | Status: DC
  Administered 2023-12-04 – 2023-12-06 (×3): 75 mg via ORAL
  Filled 2023-12-04 (×3): qty 1

## 2023-12-04 MED ORDER — ENOXAPARIN SODIUM 40 MG/0.4ML IJ SOSY
40.0000 mg | PREFILLED_SYRINGE | INTRAMUSCULAR | Status: DC
  Administered 2023-12-04 – 2023-12-06 (×2): 40 mg via SUBCUTANEOUS
  Filled 2023-12-04 (×3): qty 0.4

## 2023-12-04 MED ORDER — ACETAMINOPHEN 160 MG/5ML PO SOLN: 650.0000 mg | ORAL | Status: DC | PRN

## 2023-12-04 MED ORDER — ASPIRIN 81 MG PO TBEC
81.0000 mg | DELAYED_RELEASE_TABLET | Freq: Every day | ORAL | Status: DC
  Administered 2023-12-04 – 2023-12-06 (×3): 81 mg via ORAL
  Filled 2023-12-04 (×3): qty 1

## 2023-12-04 MED ORDER — ALBUTEROL SULFATE (2.5 MG/3ML) 0.083% IN NEBU: 2.5000 mg | INHALATION_SOLUTION | Freq: Four times a day (QID) | RESPIRATORY_TRACT | Status: DC | PRN

## 2023-12-04 NOTE — Assessment & Plan Note (Addendum)
 History of dizzy spells, orthostatic hypotension, unsteady gait History of negative TIA workup 08/2023 (MRI, MRA head and neck) Patient presenting with lateralizing gait, now resolved CT head negative, MRI pending Permissive hypertension for first 24-48 hrs post stroke onset: Prn Labetalol IV or Vasotec IV If BP greater than 220/120  Statins for LDL goal less than 70 Currently on DAPT, will continue  telemetry, had echo November 2024 with EF 50 to 55% Avoid dextrose containing fluids, Maintain euglycemia, euthermia Neuro checks q4 hrs x 24 hrs and then per shift Head of bed 30 degrees Physical therapy/Occupational therapy/Speech therapy if failed dysphagia screen Neurology consult to follow

## 2023-12-04 NOTE — Evaluation (Signed)
 Physical Therapy Evaluation Patient Details Name: Lindsey [name] MRN: [id number] DOB: 1936/01/30 Today's Date: 12/04/2023  History of Present Illness  Lindsey [name] is a 88 y.o. female with medical history significant for Bronchiectasis/COPD,  DVT s/p IVC filter, CAD, recently diagnosed HFpEF (EF 50 to 55% 08/2023) with recently diagnosed cardiac amyloidosis, orthostatic hypotension, being admitted for stroke workup after she noted she was leaning to the right side while sitting in the theater earlier and also drifting to the right when ambulating. MRI shows no acute abnormality but was significant for moderately advanced chronic microvascular ischemic disease with  remote lacunar infarct at the right basal ganglia.  Clinical Impression  88 yo Female reports new onset impaired balance. She was brought to hospital for possible stroke/TIA work-up. MRI was negative for acute abnormality. Pt denies any numbness/tingling. All cranial nerves are present and intact. BUE/BLE coordination is good. She does demonstrate a resting left LE tremor and reports RUE intentional tremor when walking. Unsure when tremor started. Normal tone throughout BUE/BLE. Pt denies any dizziness/lightheaded feeling. She required min A +1 for bed mobility with difficulty advancing trunk forward. She required physical assistance for sitting edge of bed due to heavy posterior trunk lean. Patient states she is aware she is falling backwards but demonstrates poor reaction strategies. Throughout evaluation, she did occasionally demonstrate mild right lateral lean but primary loss of balance was posterior. This was exacerbated with dual task and/or when more unsupported. Pt required min A +1 for sit to stand transfer with RW. Once standing she required physical assistance/tactile cues to right to midline. She would demonstrate ankle strategies, lifting toes when falling posteriorly, but was unable to self correct and bring self to midline.  She demonstrates no hip strategies. Patient would benefit from additional skilled PT intervention to improve balance and positional awareness.        If plan is discharge home, recommend the following: A lot of help with bathing/dressing/bathroom;Assist for transportation;A lot of help with walking and/or transfers   Can travel by private vehicle   No    Equipment Recommendations Other (comment) (to be determined post acute rehab setting)  Recommendations for Other Services       Functional Status Assessment Patient has had a recent decline in their functional status and demonstrates the ability to make significant improvements in function in a reasonable and predictable amount of time.     Precautions / Restrictions Precautions Precautions: Fall Precaution/Restrictions Comments: no recent falls but current presentation puts patient at high risk for falls Restrictions Weight Bearing Restrictions Per Provider Order: No      Mobility  Bed Mobility Overal bed mobility: Needs Assistance Bed Mobility: Supine to Sit     Supine to sit: Min assist     General bed mobility comments: Pt able to slide legs around bed, required min A to advance trunk forward to sitting edge of bed; heavy posterior trunk lean- pt aware but has difficulty self correcting    Transfers Overall transfer level: Needs assistance Equipment used: Rolling walker (2 wheels) Transfers: Sit to/from Stand, Bed to chair/wheelchair/BSC Sit to Stand: Min assist Stand pivot transfers: Contact guard assist         General transfer comment: Utilized RW for transfers; required min A to achieve standing position. Once standing and balance obtained, she was able to pivot to bedside chair with CGA for safety    Ambulation/Gait Ambulation/Gait assistance: Contact guard assist Gait Distance (Feet): 2 Feet Assistive device: Rolling walker (2  wheels)         General Gait Details: pt took 2-3 steps for pivot  transfer to bedside chair; she required CGA for safety and walker management  Stairs            Wheelchair Mobility     Tilt Bed    Modified Rankin (Stroke Patients Only)       Balance Overall balance assessment: Needs assistance Sitting-balance support: Bilateral upper extremity supported, Feet supported Sitting balance-Leahy Scale: Poor Sitting balance - Comments: when sitting edge of bed, patient exhibits heavy posterior trunk lean; She verbalizes she knows she is falling backwards but requires physical assistance to self correct; Once neutral position obtained, therapist challenged patient to sit with arms across chest and immediately she started loosing balance posteriorly. Postural control: Posterior lean Standing balance support: Bilateral upper extremity supported, No upper extremity supported Standing balance-Leahy Scale: Poor Standing balance comment: Initially patient stood with RW requiring min A to CGA to obtain neutral position; She would lose balance posteriorly, demonstrating ankle strategies but otherwise no reaction strategy. Therapist educated pt to lean trunk forward to obtain neutral position and she was able to progress to stand without RW support, feet apart with CGA for safety. With any challenge (eyes closed, looking upward, pertubation) she would lose balance posteriorly requiring mod A for safety and balance recovery         Rhomberg - Eyes Opened: 2 (loss of balance posteriorly after 1-2 sec static standing) Rhomberg - Eyes Closed: 0 (immediate posteriorly loss of balance with eyes closed)   High Level Balance Comments: unable to achieve tandem stance; Throughout balance assessment patient required CGA to mod A for balance recovery; She does demonstrate minimal ankle stragies but no hip strategies for balance recovery             Pertinent Vitals/Pain Pain Assessment Pain Assessment: No/denies pain    Home Living Family/patient expects to be  discharged to:: Other (Comment)                   Additional Comments: [facility]; lives in 2nd floor apartment with elevator    Prior Function Prior Level of Function : Independent/Modified Independent             Mobility Comments: walked independent in apartment; would use rollator for long distance ambulation or when in community; still driving will go to grocery store; [employer] does have bus shuttle and she has considered taking that ADLs Comments: modified independent; has walk in shower with seat and grab bars; ILF provides 1 meal a day     Extremity/Trunk Assessment   Upper Extremity Assessment Upper Extremity Assessment: Defer to OT evaluation    Lower Extremity Assessment Lower Extremity Assessment: RLE deficits/detail;LLE deficits/detail RLE Deficits / Details: ROM is WFL, strength is [facility] RLE Sensation: WNL RLE Coordination: WNL LLE Deficits / Details: ROM is WFL, strength is WFL LLE Sensation: WNL LLE Coordination: WNL    Cervical / Trunk Assessment Cervical / Trunk Assessment: Normal  Communication   Communication Communication: Impaired (has hearing aides) Factors Affecting Communication: Hearing impaired    Cognition Arousal: Alert Behavior During Therapy: WFL for tasks assessed/performed   PT - Cognitive impairments: No apparent impairments                       PT - Cognition Comments: oriented to situation, date, person, place Following commands: Intact       Cueing Cueing Techniques: Verbal  cues     General Comments      Exercises Other Exercises Other Exercises: Educated patient in role of PT and recommendations; Other Exercises: Instructed patient to sit in recliner with trunk away from back of chair and starts with arms in lap, progressing to arms across chest trying to keep from falling against back of chair for at least 1 minute. Pt verbalized/demonstrated understanding.   Assessment/Plan    PT Assessment  Patient needs continued PT services  PT Problem List Decreased balance       PT Treatment Interventions DME instruction;Balance training;Gait training;Neuromuscular re-education;Functional mobility training;Therapeutic activities;Therapeutic exercise    PT Goals (Current goals can be found in the Care Plan section)  Acute Rehab PT Goals Patient Stated Goal: "To improve balance" PT Goal Formulation: With patient Time For Goal Achievement: 12/18/23    Frequency Min 1X/week     Co-evaluation PT/OT/SLP Co-Evaluation/Treatment: Yes Reason for Co-Treatment: For patient/therapist safety;To address functional/ADL transfers PT goals addressed during session: Mobility/safety with mobility;Balance;Proper use of DME;Strengthening/ROM OT goals addressed during session: ADL's and self-care;Strengthening/ROM       AM-PAC PT "6 Clicks" Mobility  Outcome Measure Help needed turning from your back to your side while in a flat bed without using bedrails?: None Help needed moving from lying on your back to sitting on the side of a flat bed without using bedrails?: A Lot Help needed moving to and from a bed to a chair (including a wheelchair)?: A Lot Help needed standing up from a chair using your arms (e.g., wheelchair or bedside chair)?: A Little Help needed to walk in hospital room?: A Lot Help needed climbing 3-5 steps with a railing? : A Lot 6 Click Score: 15    End of Session Equipment Utilized During Treatment: Gait belt Activity Tolerance: Patient tolerated treatment well Patient left: in chair;with call bell/phone within reach;with chair alarm set Nurse Communication: Mobility status PT Visit Diagnosis: Other abnormalities of gait and mobility (R26.89)    Time: 1124-1150 PT Time Calculation (min) (ACUTE ONLY): 26 min   Charges:   PT Evaluation $PT Eval Low Complexity: 1 Low   PT General Charges $$ ACUTE PT VISIT: 1 Visit           Naylin Burkle PT, DPT 12/04/2023,  2:39 PM

## 2023-12-04 NOTE — Progress Notes (Signed)
 SLP Cancellation Note  Patient Details Name: Lindsey [name] MRN: [id number] DOB: 08/12/1936   Cancelled treatment:       Reason Eval/Treat Not Completed: SLP screened, no needs identified, will sign off. Chart review completed. MRI 3/1 revealing " No acute intracranial abnormality. Moderately advanced chronic microvascular ischemic disease with remote lacunar infarct at the right basal ganglia." Pt reported concern for weakness at this time, denied speech/cognition concerns. 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 Education shared regarding reaching to PCP if speech/language needs arise. No further acute SLP services indicated at this time.      [place] Sidhant Helderman Clapp, MS, CCC-SLP Speech Language Pathologist Rehab Services; [facility] [phone] (ascom)   [place] J Clapp 12/04/2023, 12:11 PM

## 2023-12-04 NOTE — Assessment & Plan Note (Signed)
 On multiple agents.  Currently without headache

## 2023-12-04 NOTE — Assessment & Plan Note (Signed)
 Continue aspirin, acetaminophen, nitroglycerin

## 2023-12-04 NOTE — Evaluation (Signed)
 Occupational Therapy Evaluation Patient Details Name: Lindsey [name] MRN: [id number] DOB: July 09, 1936 Today's Date: 12/04/2023   History of Present Illness   Lindsey [name] is a 88 y.o. female with medical history significant for Bronchiectasis/COPD,  DVT s/p IVC filter, CAD, recently diagnosed HFpEF (EF 50 to 55% 08/2023) with recently diagnosed cardiac amyloidosis, orthostatic hypotension, being admitted for stroke workup after she noted she was leaning to the right side while sitting in the theater earlier and also drifting to the right when ambulating. MRI shows no acute abnormality but was significant for moderately advanced chronic microvascular ischemic disease with  remote lacunar infarct at the right basal ganglia.     Clinical Impressions Patient agreeable to OT evaluation. At baseline, pt is IND for ADLs, light meal prep, laundry, and driving. [facility] ([facility]) provides one meal a day and a cleaning service. Pt is A&Ox4. She denies any acute sensation changes, however, endorsed occasional numbness/tingling in fingertips at baseline and having h/o neuropathy in B feet. Pt demonstrated a resting LLE tremor and endorsed RUE intentional tremor when walking (unsure when tremor started). BUE ROM/strength WFL. Pt denies any vision changes or feeling dizzy/lightheaded. She completed bed mobility with Min A, functional sit<>stand transfers with Min A, and took side steps to bedside recliner with CGA + RW. She required CGA-Mod A for dynamic sitting/standing balance. Pt would benefit from skilled OT services to address noted impairments and functional limitations (see below for any additional details) in order to maximize safety and independence while minimizing falls risk. Anticipate the need for follow up OT services upon acute hospital DC.      If plan is discharge home, recommend the following:   A lot of help with bathing/dressing/bathroom;A lot of help with walking and/or transfers;Assist  for transportation;Assistance with cooking/housework;Help with stairs or ramp for entrance     Functional Status Assessment   Patient has had a recent decline in their functional status and demonstrates the ability to make significant improvements in function in a reasonable and predictable amount of time.     Equipment Recommendations   Other (comment) (defer)     Recommendations for Other Services         Precautions/Restrictions   Precautions Precautions: Fall Restrictions Weight Bearing Restrictions Per Provider Order: No     Mobility Bed Mobility Overal bed mobility: Needs Assistance Bed Mobility: Supine to Sit     Supine to sit: Min assist          Transfers Overall transfer level: Needs assistance Equipment used: Rolling walker (2 wheels) Transfers: Sit to/from Stand Sit to Stand: Min assist               Balance Overall balance assessment: Needs assistance Sitting-balance support: Bilateral upper extremity supported, Feet supported Sitting balance-Leahy Scale: Poor Sitting balance - Comments: CGA-Mod A with multimodal cues Postural control: Posterior lean, Right lateral lean Standing balance support: Bilateral upper extremity supported, No upper extremity supported Standing balance-Leahy Scale: Poor Standing balance comment: CGA-Mod A with multimodal cues           ADL either performed or assessed with clinical judgement   ADL Overall ADL's : Needs assistance/impaired           Toilet Transfer: Minimal assistance;Rolling walker (2 wheels);Ambulation;Contact guard assist Toilet Transfer Details (indicate cue type and reason): simulated via short ambulatory transfer from EOB>recliner         Functional mobility during ADLs: Contact guard assist;Minimal assistance;Rolling walker (2 wheels) (Pt took  2-3 steps to bedside chair; she required CGA for safety, Min A for RW management.) General ADL Comments: CGA-Mod A for  sitting/standing balance during dynamic ADLs     Vision Baseline Vision/History: 1 Wears glasses Ability to See in Adequate Light: 0 Adequate Patient Visual Report: No change from baseline       Perception         Praxis         Pertinent Vitals/Pain Pain Assessment Pain Assessment: No/denies pain     Extremity/Trunk Assessment Upper Extremity Assessment Upper Extremity Assessment: RUE deficits/detail (BUE ROM/strength WFL, FTN intact, pt endorsed occasional numbness/tingling in fingertips) RUE Deficits / Details: Pt endorsed RUE intention tremor with ambulation   Lower Extremity Assessment Lower Extremity Assessment: LLE deficits/detail (Pt endorsed h/o neuropathy in B feet) RLE Deficits / Details: ROM is WFL, strength is [facility] RLE Sensation: WNL RLE Coordination: WNL LLE Deficits / Details: LLE resting tremor noted LLE Sensation: WNL LLE Coordination: WNL   Cervical / Trunk Assessment Cervical / Trunk Assessment: Normal   Communication Communication Communication: Impaired (has hearing aids but battery dead) Factors Affecting Communication: Hearing impaired   Cognition Arousal: Alert Behavior During Therapy: WFL for tasks assessed/performed Cognition: No apparent impairments         Following commands: Intact       Cueing  General Comments   Cueing Techniques: Verbal cues      Exercises Other Exercises Other Exercises: OT provided education re: role of OT, OT POC, post acute recs, sitting up for all meals, EOB/OOB mobility with assistance, home/fall safety.     Shoulder Instructions      Home Living Family/patient expects to be discharged to:: Other (Comment)       Additional Comments: [facility]; lives in 2nd floor apartment with elevator. Has walk in shower, shower chair, GB in shower/around toilet, handheld shower hose, handicapped height toilet.      Prior Functioning/Environment Prior Level of Function : Independent/Modified  Independent;Driving             Mobility Comments: walked independent in apartment without AD; would use rollator for long distance ambulation or when in community ADLs Comments: IND for ADLs, light meal prep, and laundry. Still driving, runs errands. Pt endorsed she thought about selling her car, [employer] has a bus shuttle that she could take. ILF provides one meal a day and cleaning service.    OT Problem List: Impaired balance (sitting and/or standing);Decreased activity tolerance;Impaired sensation;Decreased coordination   OT Treatment/Interventions: Self-care/ADL training;Therapeutic exercise;Energy conservation;DME and/or AE instruction;Therapeutic activities;Patient/family education;Balance training      OT Goals(Current goals can be found in the care plan section)   Acute Rehab OT Goals Patient Stated Goal: return to PLOF, improve balance OT Goal Formulation: With patient Time For Goal Achievement: 12/18/23 Potential to Achieve Goals: Good ADL Goals Pt Will Perform Lower Body Dressing: with modified independence;sitting/lateral leans;sit to/from stand Pt Will Transfer to Toilet: with modified independence;ambulating Pt Will Perform Toileting - Clothing Manipulation and hygiene: with modified independence;sitting/lateral leans;sit to/from stand   OT Frequency:  Min 1X/week    Co-evaluation PT/OT/SLP Co-Evaluation/Treatment: Yes Reason for Co-Treatment: For patient/therapist safety;To address functional/ADL transfers PT goals addressed during session: Mobility/safety with mobility;Balance;Proper use of DME OT goals addressed during session: ADL's and self-care;Proper use of Adaptive equipment and DME      AM-PAC OT "6 Clicks" Daily Activity     Outcome Measure Help from another person eating meals?: None Help from another person taking care  of personal grooming?: A Little Help from another person toileting, which includes using toliet, bedpan, or urinal?: A Lot Help  from another person bathing (including washing, rinsing, drying)?: A Lot Help from another person to put on and taking off regular upper body clothing?: A Little Help from another person to put on and taking off regular lower body clothing?: A Lot 6 Click Score: 16   End of Session Equipment Utilized During Treatment: Rolling walker (2 wheels);Gait belt Nurse Communication: Mobility status  Activity Tolerance: Patient tolerated treatment well;Patient limited by fatigue Patient left: in chair;with call bell/phone within reach;with chair alarm set  OT Visit Diagnosis: Unsteadiness on feet (R26.81);Other abnormalities of gait and mobility (R26.89)                Time: 0454-0981 OT Time Calculation (min): 30 min Charges:  OT General Charges $OT Visit: 1 Visit OT Evaluation $OT Eval Low Complexity: 1 Low  [facility] MS, OTR/L ascom [phone]  12/04/23, 5:10 PM

## 2023-12-04 NOTE — Assessment & Plan Note (Signed)
 Cardiac amyloidosis Clinically euvolemic Recently seen by cardiologist.  Has a heart failure clinic appointment in 2 weeks

## 2023-12-04 NOTE — ED Notes (Signed)
 Assisted patient to bedside commode with RN assist.  Patient ambulated with a weakend but steady gait.  Patient taken to MRI via wheelchair.

## 2023-12-04 NOTE — Assessment & Plan Note (Signed)
 COPD Not currently exacerbated Continue home inhalers with DuoNebs as needed

## 2023-12-04 NOTE — H&P (Signed)
 History and Physical    Patient: Lindsey [name] UJW:[id number] DOB: July 04, 1936 DOA: 12/04/2023 DOS: the patient was seen and examined on 12/04/2023 PCP: Barbette Reichmann, MD  Patient coming from: Home  Chief Complaint:  Chief Complaint  Patient presents with   Weakness    HPI: [name] is a 88 y.o. female with medical history significant for Bronchiectasis/COPD,  DVT s/p IVC filter, CAD, recently diagnosed HFpEF (EF 50 to 55% 08/2023) with recently diagnosed cardiac amyloidosis, orthostatic hypotension, being admitted for stroke workup after she noted she was leaning to the right side while sitting in the theater earlier and also drifting to the right when ambulating.  Patient was last seen by her cardiologist the day prior for routine follow-up for her orthostatic hypotension when she reported improvement in her orthostatic symptoms with prior discontinuation of Lasix, bisoprolol and losartan.   She was also seen in November 2024 by her neurologist.  She was noted to have a history of dizzy spells, gait imbalance and migraine.  She reported a spell of speech finding difficulty had a negative stroke workup.  MRI brain, MRA head and neck were unremarkable.  She is maintained on DAPT.  The symptoms were attributed to her migraines. ED course and data review: BP 172/102 with otherwise normal vitals Workup including CBC, BMP, UDS and UA unremarkable except for moderate leukocyte esterase on UA. EKG, personally viewed and interpreted showing NSR at 72 with RBBB CT head nonacute MRI brain pending  Observation requested     Past Medical History:  Diagnosis Date   Anemia    Atrial tachycardia (HCC)    Breast cancer (HCC)    Bronchiectasis (HCC) 06/2007   CAD (coronary artery disease)    a. 02/2022 Cath: LM nl, LAD 24m, D23 40, LCX 1103m/d w/ L->L and R->L collats, RCA 62m-->Med Rx.   Cardiac amyloidosis (HCC)    a. 11/2023 PYP: + radiotracer uptake suggestive of ATTR.   Chronic  Dizziness    Colitis    COPD (chronic obstructive pulmonary disease) (HCC)    Depression    Diastolic dysfunction    a. 12/2019 Echo: EF 60-65%, no rwma, GrII DD. Nl RV size/fxn. Mild MR/AI.   DVT (deep venous thrombosis) (HCC)    after her right knee surgery.    Fibromyalgia    Heart failure with improved ejection fraction (HFimpEF) (HCC)    a. 02/2022 Echo: EF 40-45%, GrIII DD; b. 03/2023 Echo: EF 55-60%; c. 08/2023 Echo: EF 50-55%, mod LVH, GrI DD, low-nl RV fxn, ml PASP, mild MR/AI, mild-mod TR, AoV sclerosis.   Ischemic cardiomyopathy    a. 02/2022 Echo: EF 40-45%; b. 03/2023 Echo: EF 55-60%; c. 08/2023 Echo: EF 50-55%.   Labile Hypertension    Migraine headache with aura    Osteoarthritis    Osteoporosis    PAD (peripheral artery disease) (HCC)    Pneumonia    Polio 1952   Stenosis of carotid artery    Past Surgical History:  Procedure Laterality Date   ABDOMINAL HYSTERECTOMY     BACK SURGERY     BILATERAL TOTAL MASTECTOMY WITH AXILLARY LYMPH NODE DISSECTION     BREAST SURGERY     CARPAL TUNNEL RELEASE     bilateral    COLONOSCOPY WITH PROPOFOL N/A 08/02/2015   Procedure: COLONOSCOPY WITH PROPOFOL;  Surgeon: Scot Jun, MD;  Location: [facility] ENDOSCOPY;  Service: Endoscopy;  Laterality: N/A;   FINGER TENDON REPAIR     HERNIA REPAIR  REPLACEMENT TOTAL KNEE     right knee    RIGHT/LEFT HEART CATH AND CORONARY ANGIOGRAPHY N/A 03/03/2022   Procedure: RIGHT/LEFT HEART CATH AND CORONARY ANGIOGRAPHY;  Surgeon: Yvonne Kendall, MD;  Location: [facility] INVASIVE CV LAB;  Service: Cardiovascular;  Laterality: N/A;   SQUAMOUS CELL CARCINOMA EXCISION     TONSILLECTOMY     TOTAL KNEE ARTHROPLASTY Left 09/19/2020   Procedure: TOTAL KNEE ARTHROPLASTY;  Surgeon: Kennedy Bucker, MD;  Location: [facility] ORS;  Service: Orthopedics;  Laterality: Left;   UMBILICAL HERNIA REPAIR     VAGINAL HYSTERECTOMY     Social History:  reports that she has never smoked. She has never used smokeless tobacco.  She reports current alcohol use of about 4.0 standard drinks of alcohol per week. She reports that she does not use drugs.  Allergies  Allergen Reactions   Ivp Dye [Iodinated Contrast Media] Anaphylaxis   Charentais Melon (French Melon) Other (See Comments)    All melons cause stomach pain and upset   Codeine Other (See Comments)    Dizziness  Dizziness   Cucumber Extract    Demeclocycline Other (See Comments)    Stomach upset    Galcanezumab-Gnlm Itching   Repatha [Evolocumab] Dermatitis    LUMP AT AREA OF SHOT AND ITCHING   Sulfa Antibiotics Hives   Tegaderm Ag Mesh [Silver]    Telmisartan     dizziness Other reaction(s): Dizziness dizziness   Tetracyclines & Related     Stomach upset    Wild Lettuce Extract (Lactuca Virosa) Other (See Comments)    Stomach upset and pain    Family History  Problem Relation Age of Onset   Pancreatic cancer Mother    Arthritis Mother    Ovarian cancer Mother    Arthritis Father    Bladder Cancer Father    Atrial fibrillation Sister    Endometrial cancer Sister    Colon cancer Sister     Prior to Admission medications   Medication Sig Start Date End Date Taking? Authorizing Provider  acetaminophen (TYLENOL) 500 MG tablet Take 500 mg by mouth at bedtime as needed for moderate pain.    [provider]  albuterol (VENTOLIN HFA) 108 (90 Base) MCG/ACT inhaler Inhale 2 puffs into the lungs every 6 (six) hours as needed for wheezing or shortness of breath.    [provider]  Alirocumab (PRALUENT) 150 MG/ML SOAJ Inject 1 mL (150 mg total) into the skin every 14 (fourteen) days. 10/25/23   Antonieta Iba, MD  ARIPiprazole (ABILIFY) 2 MG tablet Take 2 mg by mouth daily.     [provider]  aspirin EC 81 MG tablet Take 1 tablet (81 mg total) by mouth daily. Swallow whole. 08/19/23   Lurene Shadow, MD  clopidogrel (PLAVIX) 75 MG tablet TAKE ONE TABLET EACH MORNING WITH BREAKFAST 05/04/23   Antonieta Iba, MD   cyanocobalamin (VITAMIN B12) 1000 MCG tablet Take 1 tablet (1,000 mcg total) by mouth daily. 08/18/23   Lurene Shadow, MD  cyclobenzaprine (FLEXERIL) 5 MG tablet Take 5 mg by mouth at bedtime as needed. 04/14/21   [provider]  doxazosin (CARDURA) 2 MG tablet Take 1 tablet (2 mg total) by mouth daily. 06/29/23   Antonieta Iba, MD  ezetimibe (ZETIA) 10 MG tablet TAKE 1 TABLET BY MOUTH DAILY. 11/25/23   Antonieta Iba, MD  furosemide (LASIX) 20 MG tablet Take 1 tablet (20 mg total) by mouth daily as needed (daily weight gain of 2  or more pounds). 10/21/23 01/19/24  Creig Hines, NP  hydrOXYzine (ATARAX/VISTARIL) 25 MG tablet Take 25 mg by mouth as needed for anxiety.    [provider]  Multiple Vitamins-Minerals (PRESERVISION AREDS 2 PO) Take by mouth.    [provider]  nitroGLYCERIN (NITROLINGUAL) 0.4 MG/SPRAY spray Place 1 spray under the tongue every 5 (five) minutes x 3 doses as needed for chest pain. 08/24/23   Carlos Levering, NP  Omega-3 Fatty Acids (FISH OIL OMEGA-3 PO) Take 2 capsules by mouth at bedtime.    [provider]  polyethylene glycol (MIRALAX / GLYCOLAX) packet Take 17 g by mouth daily as needed for mild constipation or moderate constipation.    [provider]  potassium chloride (KLOR-CON M) 10 MEQ tablet TAKE 1 TABLET BY MOUTH DAILY 08/24/23   Antonieta Iba, MD  predniSONE (DELTASONE) 50 MG tablet Take one tablet 13 hours, 7 hours, and 1 hour prior to scan. 02/12/23   Hammock, Lavonna Rua, NP  sodium chloride HYPERTONIC 3 % nebulizer solution Take 5 mLs by nebulization every 4 (four) hours as needed. 02/09/22   [provider]  SYMBICORT 160-4.5 MCG/ACT inhaler SMARTSIG:2 Inhalation Via Inhaler Twice Daily 08/21/21   [provider]  venlafaxine XR (EFFEXOR-XR) 150 MG 24 hr capsule Take 150 mg by mouth daily. 02/02/22   [provider]    Physical Exam: Vitals:   12/04/23 0035 12/04/23  0045 12/04/23 0100 12/04/23 0200  BP: (!) 186/91 (!) 188/133 (!) 211/119 (!) 192/105  Pulse: 63 67 72 72  Resp: 16 20 20 18   Temp:      TempSrc:      SpO2: 93% 90% 97% 100%  Weight:      Height:       Physical Exam Vitals and nursing note reviewed.  Constitutional:      General: She is not in acute distress. HENT:     Head: Normocephalic and atraumatic.  Cardiovascular:     Rate and Rhythm: Normal rate and regular rhythm.     Heart sounds: Normal heart sounds.  Pulmonary:     Effort: Pulmonary effort is normal.     Breath sounds: Normal breath sounds.  Abdominal:     Palpations: Abdomen is soft.     Tenderness: There is no abdominal tenderness.  Neurological:     Mental Status: Mental status is at baseline.     Labs on Admission: I have personally reviewed following labs and imaging studies  CBC: Recent Labs  Lab 12/03/23 2238  WBC 7.5  HGB 14.0  HCT 42.4  MCV 89.6  PLT 220   Basic Metabolic Panel: Recent Labs  Lab 12/03/23 2238  NA 138  K 3.5  CL 100  CO2 27  GLUCOSE 95  BUN 30*  CREATININE 0.68  CALCIUM 9.0   GFR: Estimated Creatinine Clearance: 40.7 mL/min (by C-G formula based on SCr of 0.68 mg/dL). Liver Function Tests: No results for input(s): "AST", "ALT", "ALKPHOS", "BILITOT", "PROT", "ALBUMIN" in the last 168 hours. No results for input(s): "LIPASE", "AMYLASE" in the last 168 hours. No results for input(s): "AMMONIA" in the last 168 hours. Coagulation Profile: No results for input(s): "INR", "PROTIME" in the last 168 hours. Cardiac Enzymes: No results for input(s): "CKTOTAL", "CKMB", "CKMBINDEX", "TROPONINI" in the last 168 hours. BNP (last 3 results) No results for input(s): "PROBNP" in the last 8760 hours. HbA1C: No results for input(s): "HGBA1C" in the last 72 hours. CBG: Recent Labs  Lab 12/03/23  2237  GLUCAP 63*   Lipid Profile: No results for input(s): "CHOL", "HDL", "LDLCALC", "TRIG", "CHOLHDL", "LDLDIRECT" in the last 72  hours. Thyroid Function Tests: No results for input(s): "TSH", "T4TOTAL", "FREET4", "T3FREE", "THYROIDAB" in the last 72 hours. Anemia Panel: No results for input(s): "VITAMINB12", "FOLATE", "FERRITIN", "TIBC", "IRON", "RETICCTPCT" in the last 72 hours. Urine analysis:    Component Value Date/Time   COLORURINE YELLOW (A) 12/04/2023 0100   APPEARANCEUR CLEAR (A) 12/04/2023 0100   LABSPEC 1.010 12/04/2023 0100   PHURINE 6.0 12/04/2023 0100   GLUCOSEU NEGATIVE 12/04/2023 0100   HGBUR NEGATIVE 12/04/2023 0100   BILIRUBINUR NEGATIVE 12/04/2023 0100   KETONESUR NEGATIVE 12/04/2023 0100   PROTEINUR NEGATIVE 12/04/2023 0100   NITRITE NEGATIVE 12/04/2023 0100   LEUKOCYTESUR MODERATE (A) 12/04/2023 0100    Radiological Exams on Admission: CT Head Wo Contrast Result Date: 12/03/2023 CLINICAL DATA:  Syncope or presyncope with cerebrovascular cause suspected. EXAM: CT HEAD WITHOUT CONTRAST TECHNIQUE: Contiguous axial images were obtained from the base of the skull through the vertex without intravenous contrast. RADIATION DOSE REDUCTION: This exam was performed according to the departmental dose-optimization program which includes automated exposure control, adjustment of the mA and/or kV according to patient size and/or use of iterative reconstruction technique. COMPARISON:  MRI brain 08/17/2023.  CT head 08/17/2023. FINDINGS: Brain: Diffuse cerebral atrophy. Ventricular dilatation consistent with central atrophy. Low-attenuation changes in the deep white matter consistent with small vessel ischemia. No abnormal extra-axial fluid collections. No mass effect or midline shift. Gray-white matter junctions are distinct. Basal cisterns are not effaced. No acute intracranial hemorrhage. Old lacunar infarcts in the right basal ganglia Vascular: No hyperdense vessel or unexpected calcification. Skull: Normal. Negative for fracture or focal lesion. Sinuses/Orbits: Paranasal sinuses and mastoid air cells are clear.  Postoperative bilateral ostiomeatal windows. Other: None. IMPRESSION: No acute intracranial abnormalities. Chronic atrophy and small vessel ischemic changes. Electronically Signed   By: Burman Nieves M.D.   On: 12/03/2023 23:21     Data Reviewed: Relevant notes from primary care and specialist visits, past discharge summaries as available in EHR, including Care Everywhere. Prior diagnostic testing as pertinent to current admission diagnoses Updated medications and problem lists for reconciliation ED course, including vitals, labs, imaging, treatment and response to treatment Triage notes, nursing and pharmacy notes and ED provider's notes Notable results as noted in HPI   Assessment and Plan: * Unsteady gait, possible TIA History of dizzy spells, orthostatic hypotension, unsteady gait History of negative TIA workup 08/2023 (MRI, MRA head and neck) Patient presenting with lateralizing gait, now resolved CT head negative, MRI pending Permissive hypertension for first 24-48 hrs post stroke onset: Prn Labetalol IV or Vasotec IV If BP greater than 220/120  Statins for LDL goal less than 70 Currently on DAPT, will continue  telemetry, had echo November 2024 with EF 50 to 55% Avoid dextrose containing fluids, Maintain euglycemia, euthermia Neuro checks q4 hrs x 24 hrs and then per shift Head of bed 30 degrees Physical therapy/Occupational therapy/Speech therapy if failed dysphagia screen Neurology consult to follow   (HFpEF) heart failure with preserved ejection fraction (HCC) Cardiac amyloidosis Clinically euvolemic Recently seen by cardiologist.  Has a heart failure clinic appointment in 2 weeks  CAD (coronary artery disease) Continue aspirin, acetaminophen, nitroglycerin  Migraines On multiple agents.  Currently without headache  Bronchiectasis (HCC) COPD Not currently exacerbated Continue home inhalers with DuoNebs as needed  S/P IVC filter No acute disease     DVT  prophylaxis: Lovenox  Consults: Urology, DrMarland Kitchen  Advance Care Planning:   Code Status: Prior   Family Communication: none  Disposition Plan: Back to previous home environment  Severity of Illness: The appropriate patient status for this patient is OBSERVATION. Observation status is judged to be reasonable and necessary in order to provide the required intensity of service to ensure the patient's safety. The patient's presenting symptoms, physical exam findings, and initial radiographic and laboratory data in the context of their medical condition is felt to place them at decreased risk for further clinical deterioration. Furthermore, it is anticipated that the patient will be medically stable for discharge from the hospital within 2 midnights of admission.   Author: Andris Baumann, MD 12/04/2023 2:02 AM  For on call review [email or web address].

## 2023-12-04 NOTE — Plan of Care (Signed)
 Pt is alert and oriented, denies any c/o pain. Pt sat up in chair and ambulated to bathroom with FWW with stand-by assist. Pt has some generalized weakness, no deficits noted. Speech clear.  Bed alarm on for safety.  Problem: Education: Goal: Knowledge of General Education information will improve Description: Including pain rating scale, medication(s)/side effects and non-pharmacologic comfort measures Outcome: Progressing   Problem: Health Behavior/Discharge Planning: Goal: Ability to manage health-related needs will improve Outcome: Progressing   Problem: Clinical Measurements: Goal: Ability to maintain clinical measurements within normal limits will improve Outcome: Progressing Goal: Will remain free from infection Outcome: Progressing Goal: Diagnostic test results will improve Outcome: Progressing Goal: Respiratory complications will improve Outcome: Progressing Goal: Cardiovascular complication will be avoided Outcome: Progressing   Problem: Activity: Goal: Risk for activity intolerance will decrease Outcome: Progressing   Problem: Nutrition: Goal: Adequate nutrition will be maintained Outcome: Progressing   Problem: Coping: Goal: Level of anxiety will decrease Outcome: Progressing   Problem: Elimination: Goal: Will not experience complications related to bowel motility Outcome: Progressing Goal: Will not experience complications related to urinary retention Outcome: Progressing   Problem: Pain Managment: Goal: General experience of comfort will improve and/or be controlled Outcome: Progressing   Problem: Safety: Goal: Ability to remain free from injury will improve Outcome: Progressing   Problem: Skin Integrity: Goal: Risk for impaired skin integrity will decrease Outcome: Progressing   Problem: Education: Goal: Knowledge of disease or condition will improve Outcome: Progressing Goal: Knowledge of secondary prevention will improve (MUST DOCUMENT  ALL) Outcome: Progressing Goal: Knowledge of patient specific risk factors will improve (DELETE if not current risk factor) Outcome: Progressing   Problem: Ischemic Stroke/TIA Tissue Perfusion: Goal: Complications of ischemic stroke/TIA will be minimized Outcome: Progressing   Problem: Coping: Goal: Will verbalize positive feelings about self Outcome: Progressing Goal: Will identify appropriate support needs Outcome: Progressing   Problem: Health Behavior/Discharge Planning: Goal: Ability to manage health-related needs will improve Outcome: Progressing Goal: Goals will be collaboratively established with patient/family Outcome: Progressing   Problem: Self-Care: Goal: Ability to participate in self-care as condition permits will improve Outcome: Progressing Goal: Verbalization of feelings and concerns over difficulty with self-care will improve Outcome: Progressing Goal: Ability to communicate needs accurately will improve Outcome: Progressing   Problem: Nutrition: Goal: Risk of aspiration will decrease Outcome: Progressing Goal: Dietary intake will improve Outcome: Progressing

## 2023-12-04 NOTE — Assessment & Plan Note (Signed)
 No acute disease

## 2023-12-04 NOTE — ED Provider Notes (Signed)
 [facility] Provider Note    Event Date/Time   First MD Initiated Contact with Patient 12/04/23 0031     (approximate)   History   Weakness   HPI  Lindsey [name] is a 88 y.o. female   Past medical history of provoked DVT with IVC filter, hypertension, fibromyalgia, COPD, PAD, carotid artery stenosis, ischemic cardiomyopathy cardiac amyloidosis who presents to Emergency Department with dizziness.  Onset around 730 while she was seated at a theater.  She felt that she was leaning to the right side could not get up.  The symptoms resolved by the time I have met with her in the emergency department room.  She still feels some vague lightheadedness but nothing to the degree of what she felt earlier in the evening.  She has otherwise been in her regular state of health and is felt well no other acute medical complaints.  She denies headache or visual changes.  She denies numbness.  Denies weakness focally.  Independent Historian contributed to assessment above: Her friend is at bedside to corroborate information past medical history as above    Physical Exam   Triage Vital Signs: ED Triage Vitals  Encounter Vitals Group     BP 12/03/23 2345 (!) 172/102     Systolic BP Percentile --      Diastolic BP Percentile --      Pulse Rate 12/03/23 2345 92     Resp 12/03/23 2345 18     Temp 12/03/23 2345 98.1 F (36.7 C)     Temp Source 12/03/23 2345 Oral     SpO2 12/03/23 2345 100 %     Weight 12/03/23 2231 122 lb (55.3 kg)     Height 12/03/23 2231 5\' 2"  (1.575 m)     Head Circumference --      Peak Flow --      Pain Score 12/03/23 2230 0     Pain Loc --      Pain Education --      Exclude from Growth Chart --     Most recent vital signs: Vitals:   12/04/23 0027 12/04/23 0035  BP:  (!) 186/91  Pulse:  63  Resp:  16  Temp: 98 F (36.7 C)   SpO2:  93%    General: Awake, no distress.  CV:  Good peripheral perfusion.  Resp:  Normal effort.   Abd:  No distention.  Other:  Pleasant woman in no acute distress.  She is hypertensive otherwise vital signs are normal.  There is no dysarthria or facial asymmetry, she is awake alert and oriented.  She has no motor or sensory deficits and finger-to-nose is normal.  She is able to stand and walk several steps but leans ever so slightly to the right side which is not normal for her and subjectively feels "a little dizzy"   ED Results / Procedures / Treatments   Labs (all labs ordered are listed, but only abnormal results are displayed) Labs Reviewed  BASIC METABOLIC PANEL - Abnormal; Notable for the following components:      Result Value   BUN 30 (*)    All other components within normal limits  CBG MONITORING, ED - Abnormal; Notable for the following components:   Glucose-Capillary 63 (*)    All other components within normal limits  CBC  URINALYSIS, ROUTINE W REFLEX MICROSCOPIC  URINE DRUG SCREEN, QUALITATIVE ([facility] ONLY)     I ordered and reviewed the above labs they  are notable for blood glucose of 63 on CBG but BMP shows normalized blood glucose.  EKG  ED ECG REPORT I, Pilar Jarvis, the attending physician, personally viewed and interpreted this ECG.   Date: 12/04/2023  EKG Time: 2243  Rate: 72  Rhythm: sinus  Axis: nl  Intervals: Bifascicular block, same as previous  ST&T Change: No STEMI    RADIOLOGY I independently reviewed and interpreted CT of the head see no obvious bleeding or midline shift I also reviewed radiologist's formal read.   PROCEDURES:  Critical Care performed: No  Procedures   MEDICATIONS ORDERED IN ED: Medications - No data to display  External physician / consultants:  I spoke with hospital medicine for admission and regarding care plan for this patient.   IMPRESSION / MDM / ASSESSMENT AND PLAN / ED COURSE  I reviewed the triage vital signs and the nursing notes.                                Patient's presentation is most  consistent with acute presentation with potential threat to life or bodily function.  Differential diagnosis includes, but is not limited to, stroke, vertigo peripheral, electrolyte derangements, dysrhythmia   The patient is on the cardiac monitor to evaluate for evidence of arrhythmia and/or significant heart rate changes.  MDM:    The patient is slightly ataxic leaning to the right as she tries to ambulate.  She typically ambulates short distances without assistance but does use a rolling walker when she is outside of her home for assistant walking.  Her symptoms onset approximately 7:30 PM and denies outside of the thrombolytic so did not activate stroke code as I assessed her initially just after midnight.  Her symptoms have mostly resolved subjectively but she does still feel a little bit of lightheadedness and as mentioned in the exams leans ever so slightly to the right side when she stands and take some steps.  She has a longstanding history of chronic dizziness that comes and goes but never leans to the right side like this.  I think she should stay for TIA/stroke workup and I have ordered an MRI of her brain.  Admission.        FINAL CLINICAL IMPRESSION(S) / ED DIAGNOSES   Final diagnoses:  Dizziness  Ataxia     Rx / DC Orders   ED Discharge Orders     None        Note:  This document was prepared using Dragon voice recognition software and may include unintentional dictation errors.    Pilar Jarvis, MD 12/04/23 [phone]

## 2023-12-04 NOTE — Progress Notes (Signed)
 Interim progress note not for billing.  I have seen and examined the patient, agree with h and p documented this morning unless otherwise stipulated.  Presenting with one week of balance difficulty, found leaning to her right. Concern for CVA but mri is negative. Continues to have ongoing postural instability, PT/OT advising skilled nursing. Given ongoing neurologic symptoms, neurology will see. Is followed by neurology for migraines, and prior concern for TIA has been chalked up to migraine, so this may be an ongoing symptom of her migraines. Is already on dapt.

## 2023-12-05 DIAGNOSIS — R2681 Unsteadiness on feet: Secondary | ICD-10-CM

## 2023-12-05 DIAGNOSIS — G20C Parkinsonism, unspecified: Secondary | ICD-10-CM

## 2023-12-05 MED ORDER — DOXAZOSIN MESYLATE 2 MG PO TABS
2.0000 mg | ORAL_TABLET | Freq: Every day | ORAL | Status: DC
  Administered 2023-12-05 – 2023-12-06 (×2): 2 mg via ORAL
  Filled 2023-12-05 (×2): qty 1

## 2023-12-05 MED ORDER — LABETALOL HCL 5 MG/ML IV SOLN: 20.0000 mg | INTRAVENOUS | Status: DC | PRN

## 2023-12-05 MED ORDER — VENLAFAXINE HCL ER 75 MG PO CP24
150.0000 mg | ORAL_CAPSULE | Freq: Every day | ORAL | Status: DC
  Administered 2023-12-05 – 2023-12-06 (×2): 150 mg via ORAL
  Filled 2023-12-05 (×2): qty 2

## 2023-12-05 NOTE — Plan of Care (Signed)
  Problem: Education: Goal: Knowledge of General Education information will improve Description: Including pain rating scale, medication(s)/side effects and non-pharmacologic comfort measures Outcome: Progressing   Problem: Health Behavior/Discharge Planning: Goal: Ability to manage health-related needs will improve Outcome: Progressing   Problem: Clinical Measurements: Goal: Ability to maintain clinical measurements within normal limits will improve Outcome: Progressing Goal: Will remain free from infection Outcome: Progressing   Problem: Activity: Goal: Risk for activity intolerance will decrease Outcome: Progressing   Problem: Nutrition: Goal: Adequate nutrition will be maintained Outcome: Progressing   Problem: Coping: Goal: Level of anxiety will decrease Outcome: Progressing   Problem: Elimination: Goal: Will not experience complications related to bowel motility Outcome: Progressing Goal: Will not experience complications related to urinary retention Outcome: Progressing   Problem: Pain Managment: Goal: General experience of comfort will improve and/or be controlled Outcome: Progressing   Problem: Safety: Goal: Ability to remain free from injury will improve Outcome: Progressing   Problem: Skin Integrity: Goal: Risk for impaired skin integrity will decrease Outcome: Progressing   Problem: Clinical Measurements: Goal: Diagnostic test results will improve Outcome: Not Applicable Goal: Respiratory complications will improve Outcome: Not Applicable Goal: Cardiovascular complication will be avoided Outcome: Not Applicable

## 2023-12-05 NOTE — Care Management Obs Status (Signed)
 MEDICARE OBSERVATION STATUS NOTIFICATION   Patient Details  Name: DEMEKA SUTTER MRN: [id number] Date of Birth: 1936-04-01   Medicare Observation Status Notification Given:  Yes    Reuel Boom Zury Fazzino, LCSW 12/05/2023, 2:48 PM

## 2023-12-05 NOTE — Consult Note (Signed)
 NEUROLOGY CONSULTATION NOTE   Date of service: December 05, 2023 Patient Name: Lindsey [name] MRN:  [id number] DOB:  11/13/1935 Reason for consult: gait imbalance Requesting physician: Dr. Shonna Chock _ _ _   _ __   _ __ _ _  __ __   _ __   __ _  History of Present Illness   This is an 88 year old woman with past medical history significant for CAD, breast cancer, cardiac amyloidosis, vertigo, COPD, depression, DVT, heart failure, migraine with aura, PAD, polio in 1952, carotid stenosis who presents with worsening gait instability.  While working with PT she was found to be off balance and despite living in independent living she is unable to take even 1 step without assistance.  She has a resting right upper extremity tremor as well as a resting left lower extremity tremor.  She reports that sometimes her left leg "locks up" while she is walking.  She also reports that she has a right sided upper extremity tremor that worsens with walking.  She takes Abilify for depression although her depression has been recently well-controlled.  She has not had any dose changes in this medication recently.  The only new addition to her medication regimen is losartan.   ROS   Per HPI: all other systems reviewed and are negative  Past History   I have reviewed the following:  Past Medical History:  Diagnosis Date   Anemia    Atrial tachycardia (HCC)    Breast cancer (HCC)    Bronchiectasis (HCC) 06/2007   CAD (coronary artery disease)    a. 02/2022 Cath: LM nl, LAD 37m, D23 40, LCX 123m/d w/ L->L and R->L collats, RCA 10m-->Med Rx.   Cardiac amyloidosis (HCC)    a. 11/2023 PYP: + radiotracer uptake suggestive of ATTR.   Chronic Dizziness    Colitis    COPD (chronic obstructive pulmonary disease) (HCC)    Depression    Diastolic dysfunction    a. 12/2019 Echo: EF 60-65%, no rwma, GrII DD. Nl RV size/fxn. Mild MR/AI.   DVT (deep venous thrombosis) (HCC)    after her right knee surgery.    Fibromyalgia     Heart failure with improved ejection fraction (HFimpEF) (HCC)    a. 02/2022 Echo: EF 40-45%, GrIII DD; b. 03/2023 Echo: EF 55-60%; c. 08/2023 Echo: EF 50-55%, mod LVH, GrI DD, low-nl RV fxn, ml PASP, mild MR/AI, mild-mod TR, AoV sclerosis.   Ischemic cardiomyopathy    a. 02/2022 Echo: EF 40-45%; b. 03/2023 Echo: EF 55-60%; c. 08/2023 Echo: EF 50-55%.   Labile Hypertension    Migraine headache with aura    Osteoarthritis    Osteoporosis    PAD (peripheral artery disease) (HCC)    Pneumonia    Polio 1952   Stenosis of carotid artery    Past Surgical History:  Procedure Laterality Date   ABDOMINAL HYSTERECTOMY     BACK SURGERY     BILATERAL TOTAL MASTECTOMY WITH AXILLARY LYMPH NODE DISSECTION     BREAST SURGERY     CARPAL TUNNEL RELEASE     bilateral    COLONOSCOPY WITH PROPOFOL N/A 08/02/2015   Procedure: COLONOSCOPY WITH PROPOFOL;  Surgeon: Scot Jun, MD;  Location: [facility] ENDOSCOPY;  Service: Endoscopy;  Laterality: N/A;   FINGER TENDON REPAIR     HERNIA REPAIR     REPLACEMENT TOTAL KNEE     right knee    RIGHT/LEFT HEART CATH AND CORONARY ANGIOGRAPHY N/A 03/03/2022  Procedure: RIGHT/LEFT HEART CATH AND CORONARY ANGIOGRAPHY;  Surgeon: Yvonne Kendall, MD;  Location: [facility] INVASIVE CV LAB;  Service: Cardiovascular;  Laterality: N/A;   SQUAMOUS CELL CARCINOMA EXCISION     TONSILLECTOMY     TOTAL KNEE ARTHROPLASTY Left 09/19/2020   Procedure: TOTAL KNEE ARTHROPLASTY;  Surgeon: Kennedy Bucker, MD;  Location: [facility] ORS;  Service: Orthopedics;  Laterality: Left;   UMBILICAL HERNIA REPAIR     VAGINAL HYSTERECTOMY     Family History  Problem Relation Age of Onset   Pancreatic cancer Mother    Arthritis Mother    Ovarian cancer Mother    Arthritis Father    Bladder Cancer Father    Atrial fibrillation Sister    Endometrial cancer Sister    Colon cancer Sister    Social History   Socioeconomic History   Marital status: Widowed    Spouse name: Not on file   Number of  children: Not on file   Years of education: Not on file   Highest education level: Not on file  Occupational History   Not on file  Tobacco Use   Smoking status: Never   Smokeless tobacco: Never  Vaping Use   Vaping status: Never Used  Substance and Sexual Activity   Alcohol use: Yes    Alcohol/week: 4.0 standard drinks of alcohol    Types: 4 Glasses of wine per week    Comment: a glass of wine every other day   Drug use: No   Sexual activity: Not on file  Other Topics Concern   Not on file  Social History Narrative   Not on file   Social Drivers of Health   Financial Resource Strain: Low Risk  (11/10/2023)   [facility]   Overall Financial Resource Strain (CARDIA)    Difficulty of Paying Living Expenses: Not hard at all  Food Insecurity: No Food Insecurity (12/04/2023)   Hunger Vital Sign    Worried About Running Out of Food in the Last Year: Never true    Ran Out of Food in the Last Year: Never true  Transportation Needs: No Transportation Needs (12/04/2023)   PRAPARE - Administrator, Civil Service (Medical): No    Lack of Transportation (Non-Medical): No  Physical Activity: Not on file  Stress: Not on file  Social Connections: Moderately Integrated (12/04/2023)   Social Connection and Isolation Panel [NHANES]    Frequency of Communication with Friends and Family: More than three times a week    Frequency of Social Gatherings with Friends and Family: Three times a week    Attends Religious Services: 1 to 4 times per year    Active Member of Clubs or Organizations: Yes    Attends Banker Meetings: 1 to 4 times per year    Marital Status: Widowed   Allergies  Allergen Reactions   Ivp Dye [Iodinated Contrast Media] Anaphylaxis   Charentais Melon (French Melon) Other (See Comments)    All melons cause stomach pain and upset   Codeine Other (See Comments)    Dizziness  Dizziness   Cucumber Extract    Demeclocycline  Other (See Comments)    Stomach upset    Galcanezumab-Gnlm Itching   Repatha [Evolocumab] Dermatitis    LUMP AT AREA OF SHOT AND ITCHING   Sulfa Antibiotics Hives   Tegaderm Ag Mesh [Silver]    Telmisartan     dizziness Other reaction(s): Dizziness dizziness   Tetracyclines & Related  Stomach upset    Wild Lettuce Extract (Lactuca Virosa) Other (See Comments)    Stomach upset and pain    Medications   Medications Prior to Admission  Medication Sig Dispense Refill Last Dose/Taking   acetaminophen (TYLENOL) 500 MG tablet Take 500 mg by mouth at bedtime as needed for moderate pain.      albuterol (VENTOLIN HFA) 108 (90 Base) MCG/ACT inhaler Inhale 2 puffs into the lungs every 6 (six) hours as needed for wheezing or shortness of breath.      Alirocumab (PRALUENT) 150 MG/ML SOAJ Inject 1 mL (150 mg total) into the skin every 14 (fourteen) days. 2 mL 12    ARIPiprazole (ABILIFY) 2 MG tablet Take 2 mg by mouth daily.       aspirin EC 81 MG tablet Take 1 tablet (81 mg total) by mouth daily. Swallow whole.      clopidogrel (PLAVIX) 75 MG tablet TAKE ONE TABLET EACH MORNING WITH BREAKFAST 90 tablet 3    cyanocobalamin (VITAMIN B12) 1000 MCG tablet Take 1 tablet (1,000 mcg total) by mouth daily.      cyclobenzaprine (FLEXERIL) 5 MG tablet Take 5 mg by mouth at bedtime as needed.      doxazosin (CARDURA) 2 MG tablet Take 1 tablet (2 mg total) by mouth daily. 90 tablet 2    ezetimibe (ZETIA) 10 MG tablet TAKE 1 TABLET BY MOUTH DAILY. 90 tablet 0    furosemide (LASIX) 20 MG tablet Take 1 tablet (20 mg total) by mouth daily as needed (daily weight gain of 2 or more pounds). 45 tablet 3    hydrOXYzine (ATARAX/VISTARIL) 25 MG tablet Take 25 mg by mouth as needed for anxiety.      Multiple Vitamins-Minerals (PRESERVISION AREDS 2 PO) Take by mouth.      nitroGLYCERIN (NITROLINGUAL) 0.4 MG/SPRAY spray Place 1 spray under the tongue every 5 (five) minutes x 3 doses as needed for chest pain. 12 g 12     Omega-3 Fatty Acids (FISH OIL OMEGA-3 PO) Take 2 capsules by mouth at bedtime.      polyethylene glycol (MIRALAX / GLYCOLAX) packet Take 17 g by mouth daily as needed for mild constipation or moderate constipation.      potassium chloride (KLOR-CON M) 10 MEQ tablet TAKE 1 TABLET BY MOUTH DAILY 90 tablet 3    predniSONE (DELTASONE) 50 MG tablet Take one tablet 13 hours, 7 hours, and 1 hour prior to scan. 3 tablet 0    sodium chloride HYPERTONIC 3 % nebulizer solution Take 5 mLs by nebulization every 4 (four) hours as needed.      SYMBICORT 160-4.5 MCG/ACT inhaler SMARTSIG:2 Inhalation Via Inhaler Twice Daily      venlafaxine XR (EFFEXOR-XR) 150 MG 24 hr capsule Take 150 mg by mouth daily.         Current Facility-Administered Medications:     stroke: early stages of recovery book, , Does not apply, Once, Andris Baumann, MD   acetaminophen (TYLENOL) tablet 650 mg, 650 mg, Oral, Q4H PRN, 650 mg at 12/04/23 0259 **OR** acetaminophen (TYLENOL) 160 MG/5ML solution 650 mg, 650 mg, Per Tube, Q4H PRN **OR** acetaminophen (TYLENOL) suppository 650 mg, 650 mg, Rectal, Q4H PRN, Andris Baumann, MD   albuterol (PROVENTIL) (2.5 MG/3ML) 0.083% nebulizer solution 2.5 mg, 2.5 mg, Inhalation, Q6H PRN, Andris Baumann, MD   aspirin EC tablet 81 mg, 81 mg, Oral, Daily, Andris Baumann, MD, 81 mg at 12/04/23 0933   clopidogrel (PLAVIX)  tablet 75 mg, 75 mg, Oral, Daily, Lindajo Royal V, MD, 75 mg at 12/04/23 0933   cyclobenzaprine (FLEXERIL) tablet 5 mg, 5 mg, Oral, TID PRN, Andris Baumann, MD, 5 mg at 12/04/23 0307   enoxaparin (LOVENOX) injection 40 mg, 40 mg, Subcutaneous, Q24H, Andris Baumann, MD, 40 mg at 12/04/23 0911   mometasone-formoterol (DULERA) 200-5 MCG/ACT inhaler 2 puff, 2 puff, Inhalation, BID, Andris Baumann, MD, 2 puff at 12/04/23 0934   nitroGLYCERIN (NITROSTAT) SL tablet 0.4 mg, 0.4 mg, Sublingual, Q5 Min x 3 PRN, Andris Baumann, MD  Vitals   Vitals:   12/04/23 1616 12/04/23 2002  12/05/23 0020 12/05/23 0554  BP: (!) 142/75 (!) 166/93 (!) 183/94 (!) 182/98  Pulse: 82 79 74 76  Resp: 18 18 18 20   Temp: 98.2 F (36.8 C) 97.7 F (36.5 C) 98.2 F (36.8 C) 97.9 F (36.6 C)  TempSrc:   Oral   SpO2: 94% 95% 95% 91%  Weight:      Height:         Body mass index is 21.77 kg/m.  Physical Exam   Gen: patient lying in bed, NAD CV: extremities appear well-perfused Resp: normal WOB  Neurologic exam MS: alert, oriented x4, follows commands Speech: no dysarthria, no aphasia CN: PERRL, VFF, EOMI, sensation intact, face symmetric, hearing intact to voice Motor: 5/5 strength throughout BUE, 4/5 strength BLE, no rigidity. Resting tremor RUE and LLE Sensory: SILT Reflexes: 2+ symm with toes down bilat Coordination: FNF with R intention tremor, no ataxia Gait: decreased step height and stride length for 1-2 steps that she could take with assistance.    Labs   CBC:  Recent Labs  Lab 12/03/23 2238  WBC 7.5  HGB 14.0  HCT 42.4  MCV 89.6  PLT 220    Basic Metabolic Panel:  Lab Results  Component Value Date   NA 138 12/03/2023   K 3.5 12/03/2023   CO2 27 12/03/2023   GLUCOSE 95 12/03/2023   BUN 30 (H) 12/03/2023   CREATININE 0.68 12/03/2023   CALCIUM 9.0 12/03/2023   GFRNONAA >60 12/03/2023   GFRAA >60 01/26/2020   Lipid Panel:  Lab Results  Component Value Date   LDLCALC 33 12/04/2023   HgbA1c:  Lab Results  Component Value Date   HGBA1C 5.7 (H) 08/17/2023   Urine Drug Screen:     Component Value Date/Time   LABOPIA NONE DETECTED 12/04/2023 0100   COCAINSCRNUR NONE DETECTED 12/04/2023 0100   LABBENZ NONE DETECTED 12/04/2023 0100   AMPHETMU NONE DETECTED 12/04/2023 0100   THCU NONE DETECTED 12/04/2023 0100   LABBARB NONE DETECTED 12/04/2023 0100    Alcohol Level     Component Value Date/Time   ETH <10 08/17/2023 1147     MRI Brain no acute infarct  Impression   89 yo woman with hx gait instability admitted for leaning to R for  past 1-2 weeks. She was found to be very imbalanced with PT. I found her to be Parkinsonian on exam with RUE and LLE resting tremor, truncal instability, decreased step height and stride length on the 1-2 steps she could take with me assisting her. She describes pill-rolling tremor on the R that emerged with walking when she could walk better and hx gait instability and orthostatic intolerance. She also reports LLE dystonia which is often seen in Parkinsonism. she is not receiving abilify here but is on it at home. I recommended continuing to hold it. She has been  on it for a while but it can cause Parkinsonian exam. She is going to f/u with outpatient neuro and if she does not improve they can do sinemet trial. She uses abilify to augment for depression which is largely controlled. She also had subtle but noticeable tongue darting likely early TD so I don't think it would be a bad idea for her to be off it for multiple reasons.   Recommendations   - Hold abilify - She has appt with neuro next week at which time she can be reassessed off abilify and sinemet trial can be considered if her exam remains Parkinsonian - Neurology to sign off, please re-engage if additional neurologic concerns arise ______________________________________________________________________   Thank you for the opportunity to take part in the care of this patient. If you have any further questions, please contact the neurology consultation attending.  Signed,  Bing Neighbors, MD [facility] [phone]  If 7pm- 7am, please page neurology on call as listed in AMION.  **Any copied and pasted documentation in this note was written by me in another application not billed for and pasted by me into this document.

## 2023-12-05 NOTE — [place] FL2 (Signed)
 [place] MEDICAID [id number] LEVEL OF CARE FORM     IDENTIFICATION  Patient Name: Lindsey [name] Birthdate: 02-19-1936 Sex: female Admission Date (Current Location): 12/04/2023  [place] and [name] Number:  Chiropodist and Address:  [facility], [address] [place], [place] [address]      Provider Number: [id number]  Attending Physician Name and Address:  Kathrynn Running, MD  Relative Name and Phone Number:       Current Level of Care: Hospital Recommended Level of Care: Skilled Nursing Facility Prior Approval Number:    Date Approved/Denied:   PASRR Number: [id number]  Discharge Plan: SNF    Current Diagnoses: Patient Active Problem List   Diagnosis Date Noted   Unsteady gait, possible TIA 12/04/2023   (HFpEF) heart failure with preserved ejection fraction (HCC) 12/04/2023   Orthostatic hypotension 12/04/2023   Cardiac amyloidosis (HCC)    MGUS (monoclonal gammopathy of unknown significance) 09/10/2023   Lymphocytopenia 09/10/2023   B12 deficiency 09/10/2023   Seizure (HCC) 08/18/2023   Altered mental status 08/18/2023   Aphasia 08/17/2023   CHF (congestive heart failure) (HCC) 04/12/2022   PAD (peripheral artery disease) (HCC)    Acute respiratory failure with hypoxia (HCC)    CAD (coronary artery disease)    Acute on chronic combined systolic and diastolic CHF (congestive heart failure) (HCC) 03/03/2022   Non-ST elevation (NSTEMI) myocardial infarction (HCC) 03/01/2022   Elevated liver function tests 03/01/2022   S/P TKR (total knee replacement) using cement, bilateral 09/19/2020   TIA (transient ischemic attack) 01/03/2020   Migraines 01/03/2020   Uncontrolled hypertension 01/03/2020   Anxiety and depression 01/03/2020   COPD with chronic bronchitis (HCC) 01/03/2020   Hypokalemia 01/03/2020   SOB (shortness of breath) 04/30/2014   Leg swelling 04/30/2014   Bronchiectasis (HCC) 04/30/2014   History of DVT (deep vein  thrombosis) 04/30/2014   Labile blood pressure 04/30/2014   S/P IVC filter 04/30/2014   History of right knee surgery 04/30/2014    Orientation RESPIRATION BLADDER Height & Weight     Time, Situation, Place, Self  Normal Continent Weight: 119 lb 0.8 oz (54 kg) Height:  5\' 2"  (157.5 cm)  BEHAVIORAL SYMPTOMS/MOOD NEUROLOGICAL BOWEL NUTRITION STATUS      Continent Diet (regular diet)  AMBULATORY STATUS COMMUNICATION OF NEEDS Skin   Limited Assist Verbally Normal                       Personal Care Assistance Level of Assistance  Bathing, Feeding, Dressing Bathing Assistance: Limited assistance Feeding assistance: Independent Dressing Assistance: Limited assistance     Functional Limitations Info  Sight, Hearing, Speech Sight Info: Adequate Hearing Info: Adequate Speech Info: Adequate    SPECIAL CARE FACTORS FREQUENCY  PT (By licensed PT), OT (By licensed OT)     PT Frequency: 3x OT Frequency: 3x            Contractures Contractures Info: Not present    Additional Factors Info  Code Status, Allergies Code Status Info: Full code Allergies Info: Ivp Dye (Iodinated Contrast Media), Charentais Melon (French Melon), Codeine, Cucumber Extract, Demeclocycline, Galcanezumab-gnlm, Repatha (Evolocumab), Sulfa Antibiotics, Tegaderm Ag Mesh (Silver), Telmisartan, Tetracyclines & Related, Wild Lettuce Extract (Lactuca Virosa)           Current Medications (12/05/2023):  This is the current hospital active medication list Current Facility-Administered Medications  Medication Dose Route Frequency Provider Last Rate Last Admin   acetaminophen (TYLENOL) tablet 650 mg  650 mg Oral Q4H PRN Andris Baumann, MD   650 mg at 12/04/23 1027   Or   acetaminophen (TYLENOL) 160 MG/5ML solution 650 mg  650 mg Per Tube Q4H PRN Andris Baumann, MD       Or   acetaminophen (TYLENOL) suppository 650 mg  650 mg Rectal Q4H PRN Andris Baumann, MD       albuterol (PROVENTIL) (2.5 MG/3ML)  0.083% nebulizer solution 2.5 mg  2.5 mg Inhalation Q6H PRN Andris Baumann, MD       aspirin EC tablet 81 mg  81 mg Oral Daily Lindajo Royal V, MD   81 mg at 12/05/23 0930   clopidogrel (PLAVIX) tablet 75 mg  75 mg Oral Daily Lindajo Royal V, MD   75 mg at 12/05/23 0930   cyclobenzaprine (FLEXERIL) tablet 5 mg  5 mg Oral TID PRN Andris Baumann, MD   5 mg at 12/04/23 0307   enoxaparin (LOVENOX) injection 40 mg  40 mg Subcutaneous Q24H Andris Baumann, MD   40 mg at 12/04/23 0911   mometasone-formoterol (DULERA) 200-5 MCG/ACT inhaler 2 puff  2 puff Inhalation BID Andris Baumann, MD   2 puff at 12/04/23 0934   nitroGLYCERIN (NITROSTAT) SL tablet 0.4 mg  0.4 mg Sublingual Q5 Min x 3 PRN Andris Baumann, MD         Discharge Medications: Please see discharge summary for a list of discharge medications.  Relevant Imaging Results:  Relevant Lab Results:   Additional Information SSN:[id number]  Reuel Boom Hassel Uphoff, LCSW

## 2023-12-05 NOTE — Progress Notes (Signed)
 PROGRESS NOTE    Lindsey [name]  ION:[id number] DOB: 1936-04-02 DOA: 12/04/2023 PCP: Barbette Reichmann, MD  Outpatient Specialists: neurology    Brief Narrative:   From admission h and p  Lindsey [name] is a 88 y.o. female with medical history significant for Bronchiectasis/COPD,  DVT s/p IVC filter, CAD, recently diagnosed HFpEF (EF 50 to 55% 08/2023) with recently diagnosed cardiac amyloidosis, orthostatic hypotension, being admitted for stroke workup after she noted she was leaning to the right side while sitting in the theater earlier and also drifting to the right when ambulating.  Patient was last seen by her cardiologist the day prior for routine follow-up for her orthostatic hypotension when she reported improvement in her orthostatic symptoms with prior discontinuation of Lasix, bisoprolol and losartan.   She was also seen in November 2024 by her neurologist.  She was noted to have a history of dizzy spells, gait imbalance and migraine.  She reported a spell of speech finding difficulty had a negative stroke workup.  MRI brain, MRA head and neck were unremarkable.  She is maintained on DAPT.  The symptoms were attributed to her migraines  Assessment & Plan:   Principal Problem:   Unsteady gait, possible TIA Active Problems:   Orthostatic hypotension   Migraines   Uncontrolled hypertension   CAD (coronary artery disease)   Cardiac amyloidosis (HCC)   (HFpEF) heart failure with preserved ejection fraction (HCC)   Bronchiectasis (HCC)   History of DVT (deep vein thrombosis)   S/P IVC filter   COPD with chronic bronchitis (HCC)   PAD (peripheral artery disease) (HCC)  # Unsteady gait # Parkinsonism Mri negative for acute stroke. Neuro has evaluated, symptoms consistent w/ parkinsonism. Could be med side effect (ability) - hold abilify - has outpt neuro f/u next week, if symptoms fail to improve plan would probably be sinemet trial - pt advising snf, toc is working on  that  # HFpEF Euvolemic - on lasix prn outpt  # Hx CVA - home asa, plavix  # HTN Bp elevated - resume home cardura - labetalol prn  # mdd - hold home abilify as above - cont home effexor  # CAD  Asymptomatic - home asa  # Bronchiectasis No exacerbation - home inhalers  # Hx dvt Has ivc filter   DVT prophylaxis: lovenox Code Status: full Family Communication: none at bedside  Level of care: Telemetry Medical Status is: Observation    Consultants:  neurology  Procedures: none  Antimicrobials:  none    Subjective: Reports feeling fine  Objective: Vitals:   12/05/23 0020 12/05/23 0554 12/05/23 0845 12/05/23 1213  BP: (!) 183/94 (!) 182/98 (!) 173/108 (!) 181/92  Pulse: 74 76 71 72  Resp: 18 20 16 20   Temp: 98.2 F (36.8 C) 97.9 F (36.6 C) 98.3 F (36.8 C) (!) 97.5 F (36.4 C)  TempSrc: Oral     SpO2: 95% 91% 93% 95%  Weight:      Height:        Intake/Output Summary (Last 24 hours) at 12/05/2023 1445 Last data filed at 12/05/2023 0900 Gross per 24 hour  Intake 680 ml  Output --  Net 680 ml   Filed Weights   12/03/23 2231 12/04/23 0027  Weight: 55.3 kg 54 kg    Examination:  General exam: Appears calm and comfortable  Respiratory system: Clear to auscultation. Respiratory effort normal. Cardiovascular system: S1 & S2 heard, RRR.  Gastrointestinal system: Abdomen is nondistended, soft and nontender.  Central nervous system: Alert and oriented. Rest tremor Extremities: Symmetric 5 x 5 power. Skin: No rashes, lesions or ulcers Psychiatry: Judgement and insight appear normal. Mood & affect appropriate.     Data Reviewed: I have personally reviewed following labs and imaging studies  CBC: Recent Labs  Lab 12/03/23 2238  WBC 7.5  HGB 14.0  HCT 42.4  MCV 89.6  PLT 220   Basic Metabolic Panel: Recent Labs  Lab 12/03/23 2238  NA 138  K 3.5  CL 100  CO2 27  GLUCOSE 95  BUN 30*  CREATININE 0.68  CALCIUM 9.0    GFR: Estimated Creatinine Clearance: 40.7 mL/min (by C-G formula based on SCr of 0.68 mg/dL). Liver Function Tests: No results for input(s): "AST", "ALT", "ALKPHOS", "BILITOT", "PROT", "ALBUMIN" in the last 168 hours. No results for input(s): "LIPASE", "AMYLASE" in the last 168 hours. No results for input(s): "AMMONIA" in the last 168 hours. Coagulation Profile: No results for input(s): "INR", "PROTIME" in the last 168 hours. Cardiac Enzymes: No results for input(s): "CKTOTAL", "CKMB", "CKMBINDEX", "TROPONINI" in the last 168 hours. BNP (last 3 results) No results for input(s): "PROBNP" in the last 8760 hours. HbA1C: No results for input(s): "HGBA1C" in the last 72 hours. CBG: Recent Labs  Lab 12/03/23 2237  GLUCAP 63*   Lipid Profile: Recent Labs    12/04/23 0502  CHOL 144  HDL 102  LDLCALC 33  TRIG 47  CHOLHDL 1.4   Thyroid Function Tests: No results for input(s): "TSH", "T4TOTAL", "FREET4", "T3FREE", "THYROIDAB" in the last 72 hours. Anemia Panel: No results for input(s): "VITAMINB12", "FOLATE", "FERRITIN", "TIBC", "IRON", "RETICCTPCT" in the last 72 hours. Urine analysis:    Component Value Date/Time   COLORURINE YELLOW (A) 12/04/2023 0100   APPEARANCEUR CLEAR (A) 12/04/2023 0100   LABSPEC 1.010 12/04/2023 0100   PHURINE 6.0 12/04/2023 0100   GLUCOSEU NEGATIVE 12/04/2023 0100   HGBUR NEGATIVE 12/04/2023 0100   BILIRUBINUR NEGATIVE 12/04/2023 0100   KETONESUR NEGATIVE 12/04/2023 0100   PROTEINUR NEGATIVE 12/04/2023 0100   NITRITE NEGATIVE 12/04/2023 0100   LEUKOCYTESUR MODERATE (A) 12/04/2023 0100   Sepsis Labs: @LABRCNTIP (procalcitonin:4,lacticidven:4)  )No results found for this or any previous visit (from the past 240 hours).       Radiology Studies: MR BRAIN WO CONTRAST Result Date: 12/04/2023 CLINICAL DATA:  Initial evaluation for acute neuro deficit, stroke suspected. EXAM: MRI HEAD WITHOUT CONTRAST TECHNIQUE: Multiplanar, multiecho pulse  sequences of the brain and surrounding structures were obtained without intravenous contrast. COMPARISON:  CT from 12/03/2023 and MRI from 08/17/2023 FINDINGS: Brain: Cerebral volume within normal limits. Patchy and confluent T2/FLAIR hyperintensity involving the periventricular and deep white matter both cerebral hemispheres as well as the pons, consistent with chronic small vessel ischemic disease, moderately advanced in nature. Remote lacunar infarct present at the right basal ganglia. No evidence for acute or subacute ischemia. No areas of chronic cortical infarction. No acute intracranial hemorrhage. Single punctate chronic microhemorrhage noted at the left thalamus. No mass lesion, midline shift or mass effect. Mild ventricular prominence related to global parenchymal volume loss without hydrocephalus. No extra-axial fluid collection. Pituitary gland within normal limits. Vascular: Major intracranial vascular flow voids are maintained. Skull and upper cervical spine: Craniocervical junction within normal limits. Bone marrow signal intensity normal. No scalp soft tissue abnormality. Sinuses/Orbits: Prior bilateral ocular lens replacement. Scattered mucosal thickening noted throughout the paranasal sinuses. No air-fluid levels. Trace right mastoid effusion, of doubtful significance. Other: None. IMPRESSION: 1. No acute intracranial abnormality.  2. Moderately advanced chronic microvascular ischemic disease with remote lacunar infarct at the right basal ganglia. Electronically Signed   By: Rise Mu M.D.   On: 12/04/2023 02:34   CT Head Wo Contrast Result Date: 12/03/2023 CLINICAL DATA:  Syncope or presyncope with cerebrovascular cause suspected. EXAM: CT HEAD WITHOUT CONTRAST TECHNIQUE: Contiguous axial images were obtained from the base of the skull through the vertex without intravenous contrast. RADIATION DOSE REDUCTION: This exam was performed according to the departmental dose-optimization  program which includes automated exposure control, adjustment of the mA and/or kV according to patient size and/or use of iterative reconstruction technique. COMPARISON:  MRI brain 08/17/2023.  CT head 08/17/2023. FINDINGS: Brain: Diffuse cerebral atrophy. Ventricular dilatation consistent with central atrophy. Low-attenuation changes in the deep white matter consistent with small vessel ischemia. No abnormal extra-axial fluid collections. No mass effect or midline shift. Gray-white matter junctions are distinct. Basal cisterns are not effaced. No acute intracranial hemorrhage. Old lacunar infarcts in the right basal ganglia Vascular: No hyperdense vessel or unexpected calcification. Skull: Normal. Negative for fracture or focal lesion. Sinuses/Orbits: Paranasal sinuses and mastoid air cells are clear. Postoperative bilateral ostiomeatal windows. Other: None. IMPRESSION: No acute intracranial abnormalities. Chronic atrophy and small vessel ischemic changes. Electronically Signed   By: Burman Nieves M.D.   On: 12/03/2023 23:21        Scheduled Meds:  aspirin EC  81 mg Oral Daily   clopidogrel  75 mg Oral Daily   enoxaparin (LOVENOX) injection  40 mg Subcutaneous Q24H   mometasone-formoterol  2 puff Inhalation BID   Continuous Infusions:   LOS: 0 days     Silvano Bilis, MD [facility]   If 7PM-7AM, please contact night-coverage [email or web address] Password [email or web address] 12/05/2023, 2:45 PM

## 2023-12-05 NOTE — Plan of Care (Signed)
  Problem: Education: Goal: Knowledge of General Education information will improve Description: Including pain rating scale, medication(s)/side effects and non-pharmacologic comfort measures Outcome: Progressing   Problem: Health Behavior/Discharge Planning: Goal: Ability to manage health-related needs will improve Outcome: Progressing   Problem: Clinical Measurements: Goal: Ability to maintain clinical measurements within normal limits will improve Outcome: Progressing Goal: Will remain free from infection Outcome: Progressing   Problem: Activity: Goal: Risk for activity intolerance will decrease Outcome: Progressing   Problem: Nutrition: Goal: Adequate nutrition will be maintained Outcome: Progressing   Problem: Coping: Goal: Level of anxiety will decrease Outcome: Progressing   Problem: Elimination: Goal: Will not experience complications related to bowel motility Outcome: Progressing Goal: Will not experience complications related to urinary retention Outcome: Progressing   Problem: Pain Managment: Goal: General experience of comfort will improve and/or be controlled Outcome: Progressing   Problem: Safety: Goal: Ability to remain free from injury will improve Outcome: Progressing   Problem: Skin Integrity: Goal: Risk for impaired skin integrity will decrease Outcome: Progressing

## 2023-12-05 NOTE — TOC Initial Note (Signed)
 Transition of Care [facility]) - Initial/Assessment Note    Patient Details  Name: Lindsey [name] MRN: [id number] Date of Birth: 08-Jun-1936  Transition of Care [facility]) CM/SW Contact:    Maree Krabbe, LCSW Phone Number: 12/05/2023, 10:39 AM  Clinical Narrative:       Pt agreeable to Snf and wants to go to [place]. Pt is from [facility]. SW will reach out to [place].            Expected Discharge Plan: Skilled Nursing Facility Barriers to Discharge: Continued Medical Work up   Patient Goals and CMS Choice   CMS Medicare.gov Compare Post Acute Care list provided to:: Patient Choice offered to / list presented to : Patient [place] ownership interest in [facility].provided to:: Patient    Expected Discharge Plan and Services In-house Referral: Clinical Social Work   Post Acute Care Choice: Skilled Nursing Facility Living arrangements for the past 2 months: Assisted Living Facility                                      Prior Living Arrangements/Services Living arrangements for the past 2 months: Assisted Living Facility Lives with:: Self Patient language and need for interpreter reviewed:: Yes Do you feel safe going back to the place where you live?: Yes      Need for Family Participation in Patient Care: Yes (Comment) Care giver support system in place?: Yes (comment)   Criminal Activity/Legal Involvement Pertinent to Current Situation/Hospitalization: No - Comment as needed  Activities of Daily Living   ADL Screening (condition at time of admission) Independently performs ADLs?: No Does the patient have a NEW difficulty with bathing/dressing/toileting/self-feeding that is expected to last >3 days?: Yes (Initiates electronic notice to provider for possible OT consult) Does the patient have a NEW difficulty with getting in/out of bed, walking, or climbing stairs that is expected to last >3 days?: Yes (Initiates electronic notice to provider for possible  PT consult) Does the patient have a NEW difficulty with communication that is expected to last >3 days?: Yes (Initiates electronic notice to provider for possible SLP consult) Is the patient deaf or have difficulty hearing?: Yes Does the patient have difficulty seeing, even when wearing glasses/contacts?: Yes Does the patient have difficulty concentrating, remembering, or making decisions?: No  Permission Sought/Granted Permission sought to share information with : Family Supports Permission granted to share information with : Yes, Verbal Permission Granted  Share Information with NAME: Clydie Braun  Permission granted to share info w AGENCY: [facility]  Permission granted to share info w Relationship: daughter     Emotional Assessment Appearance:: Appears stated age Attitude/Demeanor/Rapport: Engaged Affect (typically observed): Accepting Orientation: : Oriented to Situation, Oriented to  Time, Oriented to Place, Oriented to Self Alcohol / Substance Use: Not Applicable Psych Involvement: No (comment)  Admission diagnosis:  TIA (transient ischemic attack) [G45.9] Dizziness [R42] Ataxia [R27.0] Patient Active Problem List   Diagnosis Date Noted   Unsteady gait, possible TIA 12/04/2023   (HFpEF) heart failure with preserved ejection fraction (HCC) 12/04/2023   Orthostatic hypotension 12/04/2023   Cardiac amyloidosis (HCC)    MGUS (monoclonal gammopathy of unknown significance) 09/10/2023   Lymphocytopenia 09/10/2023   B12 deficiency 09/10/2023   Seizure (HCC) 08/18/2023   Altered mental status 08/18/2023   Aphasia 08/17/2023   CHF (congestive heart failure) (HCC) 04/12/2022   PAD (peripheral artery disease) (HCC)  Acute respiratory failure with hypoxia (HCC)    CAD (coronary artery disease)    Acute on chronic combined systolic and diastolic CHF (congestive heart failure) (HCC) 03/03/2022   Non-ST elevation (NSTEMI) myocardial infarction (HCC) 03/01/2022   Elevated liver function  tests 03/01/2022   S/P TKR (total knee replacement) using cement, bilateral 09/19/2020   TIA (transient ischemic attack) 01/03/2020   Migraines 01/03/2020   Uncontrolled hypertension 01/03/2020   Anxiety and depression 01/03/2020   COPD with chronic bronchitis (HCC) 01/03/2020   Hypokalemia 01/03/2020   SOB (shortness of breath) 04/30/2014   Leg swelling 04/30/2014   Bronchiectasis (HCC) 04/30/2014   History of DVT (deep vein thrombosis) 04/30/2014   Labile blood pressure 04/30/2014   S/P IVC filter 04/30/2014   History of right knee surgery 04/30/2014   PCP:  Barbette Reichmann, MD Pharmacy:   [facility] - [place], [place] - [address] [place] [address] Phone: [phone] Fax: [phone]     Social Drivers of Health (SDOH) Social History: SDOH Screenings   Food Insecurity: No Food Insecurity (12/04/2023)  Housing: Low Risk  (12/04/2023)  Transportation Needs: No Transportation Needs (12/04/2023)  Utilities: Not At Risk (12/04/2023)  Depression (PHQ2-9): Low Risk  (03/11/2022)  Financial Resource Strain: Low Risk  (11/10/2023)   Received from [facility]  Social Connections: Moderately Integrated (12/04/2023)  Tobacco Use: Low Risk  (12/03/2023)   SDOH Interventions:     Readmission Risk Interventions     No data to display

## 2023-12-06 DIAGNOSIS — G20C Parkinsonism, unspecified: Principal | ICD-10-CM

## 2023-12-06 NOTE — TOC Progression Note (Signed)
 Transition of Care [facility]) - Progression Note    Patient Details  Name: Lindsey [name] MRN: [id number] Date of Birth: January 13, 1936  Transition of Care [facility] CM/SW Contact  Erin Sons, [place] Phone Number: 12/06/2023, 10:22 AM  Clinical Narrative:     Pt can admit to [facility] SNF once SNF [facility] is approved. CSW submitted auth request in online portal. Auth# [id number] SNF auth is pending   Expected Discharge Plan: Skilled Nursing Facility Barriers to Discharge: Auth  Expected Discharge Plan and Services In-house Referral: Clinical Social Work   Post Acute Care Choice: Skilled Nursing Facility Living arrangements for the past 2 months: Assisted Living Facility                                       Social Determinants of Health (SDOH) Interventions SDOH Screenings   Food Insecurity: No Food Insecurity (12/04/2023)  Housing: Low Risk  (12/04/2023)  Transportation Needs: No Transportation Needs (12/04/2023)  Utilities: Not At Risk (12/04/2023)  Depression (PHQ2-9): Low Risk  (03/11/2022)  Financial Resource Strain: Low Risk  (11/10/2023)   Received from [facility]  Social Connections: Moderately Integrated (12/04/2023)  Tobacco Use: Low Risk  (12/03/2023)    Readmission Risk Interventions     No data to display

## 2023-12-06 NOTE — TOC Transition Note (Signed)
 Transition of Care [facility]) - Discharge Note   Patient Details  Name: Lindsey [name] MRN: [id number] Date of Birth: 12-Apr-1936  [facility]) CM/SW Contact:  Erin Sons, LCSW Phone Number: 12/06/2023, 1:39 PM   Clinical Narrative:     Auth approved 12/06/23- 12/08/23. Auth# [id number] CSW spoke with [facility] and confirmed pt can admit to their SNF today.    Per MD patient ready for DC to [facility]. RN, patient, and facility notified of DC. Discharge Summary and FL2 sent to facility. RN to call report prior to discharge [phone] ). DC packet on chart. [facility] will transport pt.    CSW will sign off for now as social work intervention is no longer needed. Please consult [place] again if new needs arise.   Final next level of care: Skilled Nursing Facility Barriers to Discharge: No Barriers Identified   Patient Goals and CMS Choice   CMS Medicare.gov Compare Post Acute Care list provided to:: Patient Choice offered to / list presented to : Patient  ownership interest in [facility].provided to:: Patient    Discharge Placement              Patient chooses bed at:  [facility]) Patient to be transferred to facility by: [facility]   Patient and family notified of of transfer: 12/06/23  Discharge Plan and Services Additional resources added to the After Visit Summary for   In-house Referral: Clinical Social Work   Post Acute Care Choice: Skilled Nursing Facility                               Social Drivers of Health (SDOH) Interventions SDOH Screenings   Food Insecurity: No Food Insecurity (12/04/2023)  Housing: Low Risk  (12/04/2023)  Transportation Needs: No Transportation Needs (12/04/2023)  Utilities: Not At Risk (12/04/2023)  Depression (PHQ2-9): Low Risk  (03/11/2022)  Financial Resource Strain: Low Risk  (11/10/2023)   Received from [facility]  Social Connections: Moderately Integrated (12/04/2023)   Tobacco Use: Low Risk  (12/03/2023)     Readmission Risk Interventions     No data to display

## 2023-12-06 NOTE — Progress Notes (Signed)
 Discharge education and report called to Selena Batten at the [facility] at [place], Awaiting transportation.

## 2023-12-06 NOTE — Progress Notes (Signed)
 PROGRESS NOTE    Lindsey [name]  JYN:[id number] DOB: [birth year] DOA: 12/04/2023 PCP: Barbette Reichmann, MD  Outpatient Specialists: neurology    Brief Narrative:   From admission h and p  Lindsey [name] is a 88 y.o. female with medical history significant for Bronchiectasis/COPD,  DVT s/p IVC filter, CAD, recently diagnosed HFpEF (EF 50 to 55% 08/2023) with recently diagnosed cardiac amyloidosis, orthostatic hypotension, being admitted for stroke workup after she noted she was leaning to the right side while sitting in the theater earlier and also drifting to the right when ambulating.  Patient was last seen by her cardiologist the day prior for routine follow-up for her orthostatic hypotension when she reported improvement in her orthostatic symptoms with prior discontinuation of Lasix, bisoprolol and losartan.   She was also seen in November 2024 by her neurologist.  She was noted to have a history of dizzy spells, gait imbalance and migraine.  She reported a spell of speech finding difficulty had a negative stroke workup.  MRI brain, MRA head and neck were unremarkable.  She is maintained on DAPT.  The symptoms were attributed to her migraines  Assessment & Plan:   Principal Problem:   Parkinsonism (HCC) Active Problems:   Orthostatic hypotension   Migraines   Uncontrolled hypertension   CAD (coronary artery disease)   Cardiac amyloidosis (HCC)   (HFpEF) heart failure with preserved ejection fraction (HCC)   Bronchiectasis (HCC)   History of DVT (deep vein thrombosis)   S/P IVC filter   COPD with chronic bronchitis (HCC)   PAD (peripheral artery disease) (HCC)  # Unsteady gait # Parkinsonism Mri negative for acute stroke. Neuro has evaluated, symptoms consistent w/ parkinsonism. Could be med side effect (ability) - hold abilify - outpt neur f/u (dr. [facility]) - pt advising snf, toc is working on that, we are awaiting insurance auth  # HFpEF Euvolemic -  on lasix prn outpt  # Hx CVA - home asa, plavix  # HTN Bp mild elevation today - cont home cardura - labetalol prn  # mdd - hold home abilify as above - cont home effexor  # CAD  Asymptomatic - home asa  # Bronchiectasis No exacerbation - home inhalers  # Hx dvt Has ivc filter   DVT prophylaxis: lovenox Code Status: full Family Communication: son updated telephonically 3/3  Level of care: Telemetry Medical Status is: Observation    Consultants:  neurology  Procedures: none  Antimicrobials:  none    Subjective: Reports feeling fine, migraine this morning resolved  Objective: Vitals:   12/05/23 1213 12/05/23 1546 12/06/23 0438 12/06/23 0826  BP: (!) 181/92 (!) 162/96 (!) 155/80 (!) 152/93  Pulse: 72 68 73 69  Resp: 20 18 18 19   Temp: (!) 97.5 F (36.4 C) 97.9 F (36.6 C) 97.6 F (36.4 C) (!) 97.5 F (36.4 C)  TempSrc:    Oral  SpO2: 95% 95% 95% 95%  Weight:      Height:        Intake/Output Summary (Last 24 hours) at 12/06/2023 1159 Last data filed at 12/05/2023 1500 Gross per 24 hour  Intake 120 ml  Output --  Net 120 ml   Filed Weights   12/03/23 2231 12/04/23 0027  Weight: 55.3 kg 54 kg    Examination:  General exam: Appears calm and comfortable  Respiratory system: Clear to auscultation. Respiratory effort normal. Cardiovascular system: S1 & S2 heard, RRR.  Gastrointestinal system: Abdomen is nondistended, soft and  nontender.   Central nervous system: Alert and oriented. Rest tremor Extremities: Symmetric 5 x 5 power. Skin: No rashes, lesions or ulcers Psychiatry: Judgement and insight appear normal. Mood & affect appropriate.     Data Reviewed: I have personally reviewed following labs and imaging studies  CBC: Recent Labs  Lab 12/03/23 2238  WBC 7.5  HGB 14.0  HCT 42.4  MCV 89.6  PLT 220   Basic Metabolic Panel: Recent Labs  Lab 12/03/23 2238  NA 138  K 3.5  CL 100  CO2 27  GLUCOSE 95  BUN 30*  CREATININE  0.68  CALCIUM 9.0   GFR: Estimated Creatinine Clearance: 40.7 mL/min (by C-G formula based on SCr of 0.68 mg/dL). Liver Function Tests: No results for input(s): "AST", "ALT", "ALKPHOS", "BILITOT", "PROT", "ALBUMIN" in the last 168 hours. No results for input(s): "LIPASE", "AMYLASE" in the last 168 hours. No results for input(s): "AMMONIA" in the last 168 hours. Coagulation Profile: No results for input(s): "INR", "PROTIME" in the last 168 hours. Cardiac Enzymes: No results for input(s): "CKTOTAL", "CKMB", "CKMBINDEX", "TROPONINI" in the last 168 hours. BNP (last 3 results) No results for input(s): "PROBNP" in the last 8760 hours. HbA1C: No results for input(s): "HGBA1C" in the last 72 hours. CBG: Recent Labs  Lab 12/03/23 2237  GLUCAP 63*   Lipid Profile: Recent Labs    12/04/23 0502  CHOL 144  HDL 102  LDLCALC 33  TRIG 47  CHOLHDL 1.4   Thyroid Function Tests: No results for input(s): "TSH", "T4TOTAL", "FREET4", "T3FREE", "THYROIDAB" in the last 72 hours. Anemia Panel: No results for input(s): "VITAMINB12", "FOLATE", "FERRITIN", "TIBC", "IRON", "RETICCTPCT" in the last 72 hours. Urine analysis:    Component Value Date/Time   COLORURINE YELLOW (A) 12/04/2023 0100   APPEARANCEUR CLEAR (A) 12/04/2023 0100   LABSPEC 1.010 12/04/2023 0100   PHURINE 6.0 12/04/2023 0100   GLUCOSEU NEGATIVE 12/04/2023 0100   HGBUR NEGATIVE 12/04/2023 0100   BILIRUBINUR NEGATIVE 12/04/2023 0100   KETONESUR NEGATIVE 12/04/2023 0100   PROTEINUR NEGATIVE 12/04/2023 0100   NITRITE NEGATIVE 12/04/2023 0100   LEUKOCYTESUR MODERATE (A) 12/04/2023 0100   Sepsis Labs: @LABRCNTIP (procalcitonin:4,lacticidven:4)  )No results found for this or any previous visit (from the past 240 hours).       Radiology Studies: No results found.       Scheduled Meds:  aspirin EC  81 mg Oral Daily   clopidogrel  75 mg Oral Daily   doxazosin  2 mg Oral Daily   enoxaparin (LOVENOX) injection  40 mg  Subcutaneous Q24H   mometasone-formoterol  2 puff Inhalation BID   venlafaxine XR  150 mg Oral Daily   Continuous Infusions:   LOS: 0 days     Silvano Bilis, MD [facility]   If 7PM-7AM, please contact night-coverage [email or web address] Password [email or web address] 12/06/2023, 11:59 AM

## 2023-12-06 NOTE — Discharge Summary (Signed)
 [name] ZOX:[id number] DOB: [birth year] DOA: 12/04/2023  PCP: Barbette Reichmann, MD  Admit date: 12/04/2023 Discharge date: 12/06/2023  Time spent: 35 minutes  Recommendations for Outpatient Follow-up:  Pcp f/u Neurology f/u (has an appointment 12/07/2023)     Discharge Diagnoses:  Principal Problem:   Parkinsonism (HCC) Active Problems:   Orthostatic hypotension   Migraines   Uncontrolled hypertension   CAD (coronary artery disease)   Cardiac amyloidosis (HCC)   (HFpEF) heart failure with preserved ejection fraction (HCC)   Bronchiectasis (HCC)   History of DVT (deep vein thrombosis)   S/P IVC filter   COPD with chronic bronchitis (HCC)   PAD (peripheral artery disease) (HCC)   Discharge Condition: stable  Diet recommendation: heart healthy  Filed Weights   12/03/23 2231 12/04/23 0027  Weight: 55.3 kg 54 kg    History of present illness:  From admission h and p Lindsey [name] is a 88 y.o. female with medical history significant for Bronchiectasis/COPD,  DVT s/p IVC filter, CAD, recently diagnosed HFpEF (EF 50 to 55% 08/2023) with recently diagnosed cardiac amyloidosis, orthostatic hypotension, being admitted for stroke workup after she noted she was leaning to the right side while sitting in the theater earlier and also drifting to the right when ambulating.  Patient was last seen by her cardiologist the day prior for routine follow-up for her orthostatic hypotension when she reported improvement in her orthostatic symptoms with prior discontinuation of Lasix, bisoprolol and losartan.   She was also seen in November 2024 by her neurologist.  She was noted to have a history of dizzy spells, gait imbalance and migraine.  She reported a spell of speech finding difficulty had a negative stroke workup.  MRI brain, MRA head and neck were unremarkable.  She is maintained on DAPT.  The symptoms were attributed to her migraines.  Hospital Course:   # Unsteady gait #  Parkinsonism Mri negative for acute stroke. Neuro has evaluated, symptoms consistent w/ parkinsonism. Could be med side effect (ability) - hold abilify - has outpt neuro f/u (pt says has appt tomorrow) - d/c to snf   # HFpEF Euvolemic - on lasix prn outpt   # Hx CVA - home asa, plavix   # HTN Bp mild dlevationi - home cardura   # mdd - hold home abilify as above - cont home effexor   # CAD  Asymptomatic - home asa   # Bronchiectasis No exacerbation - home inhalers   # Hx dvt Has ivc filter  Procedures: none   Consultations: neurology  Discharge Exam: Vitals:   12/06/23 0438 12/06/23 0826  BP: (!) 155/80 (!) 152/93  Pulse: 73 69  Resp: 18 19  Temp: 97.6 F (36.4 C) (!) 97.5 F (36.4 C)  SpO2: 95% 95%    General exam: Appears calm and comfortable  Respiratory system: Clear to auscultation. Respiratory effort normal. Cardiovascular system: S1 & S2 heard, RRR.  Gastrointestinal system: Abdomen is nondistended, soft and nontender.   Central nervous system: Alert and oriented. Rest tremor Extremities: Symmetric 5 x 5 power. Skin: No rashes, lesions or ulcers Psychiatry: Judgement and insight appear normal. Mood & affect appropriate.   Discharge Instructions   Discharge Instructions     Diet - low sodium heart healthy   Complete by: As directed    Increase activity slowly   Complete by: As directed       Allergies as of 12/06/2023       Reactions   Ivp  Dye [iodinated Contrast Media] Anaphylaxis   Charentais Melon (french Melon) Other (See Comments)   All melons cause stomach pain and upset   Codeine Other (See Comments)   Dizziness  Dizziness   Cucumber Extract    Demeclocycline Other (See Comments)   Stomach upset    Galcanezumab-gnlm Itching   Repatha [evolocumab] Dermatitis   LUMP AT AREA OF SHOT AND ITCHING   Sulfa Antibiotics Hives   Tegaderm Ag Mesh [silver]    Telmisartan    dizziness Other reaction(s): Dizziness dizziness    Tetracyclines & Related    Stomach upset    Wild Lettuce Extract (lactuca Virosa) Other (See Comments)   Stomach upset and pain        Medication List     STOP taking these medications    ARIPiprazole 2 MG tablet Commonly known as: ABILIFY       TAKE these medications    acetaminophen 500 MG tablet Commonly known as: TYLENOL Take 500 mg by mouth at bedtime as needed for moderate pain.   albuterol 108 (90 Base) MCG/ACT inhaler Commonly known as: VENTOLIN HFA Inhale 2 puffs into the lungs every 6 (six) hours as needed for wheezing or shortness of breath.   aspirin EC 81 MG tablet Take 1 tablet (81 mg total) by mouth daily. Swallow whole.   clopidogrel 75 MG tablet Commonly known as: PLAVIX TAKE ONE TABLET EACH MORNING WITH BREAKFAST   cyanocobalamin 1000 MCG tablet Commonly known as: VITAMIN B12 Take 1 tablet (1,000 mcg total) by mouth daily.   cyclobenzaprine 5 MG tablet Commonly known as: FLEXERIL Take 5 mg by mouth at bedtime as needed.   doxazosin 2 MG tablet Commonly known as: CARDURA Take 1 tablet (2 mg total) by mouth daily.   ezetimibe 10 MG tablet Commonly known as: ZETIA TAKE 1 TABLET BY MOUTH DAILY.   FISH OIL OMEGA-3 PO Take 2 capsules by mouth at bedtime.   furosemide 20 MG tablet Commonly known as: LASIX Take 1 tablet (20 mg total) by mouth daily as needed (daily weight gain of 2 or more pounds).   hydrOXYzine 25 MG tablet Commonly known as: ATARAX Take 25 mg by mouth as needed for anxiety.   nitroGLYCERIN 0.4 MG/SPRAY spray Commonly known as: Nitrolingual Place 1 spray under the tongue every 5 (five) minutes x 3 doses as needed for chest pain.   polyethylene glycol 17 g packet Commonly known as: MIRALAX / GLYCOLAX Take 17 g by mouth daily as needed for mild constipation or moderate constipation.   potassium chloride 10 MEQ tablet Commonly known as: KLOR-CON M TAKE 1 TABLET BY MOUTH DAILY   Praluent 150 MG/ML Soaj Generic drug:  Alirocumab Inject 1 mL (150 mg total) into the skin every 14 (fourteen) days.   predniSONE 50 MG tablet Commonly known as: DELTASONE Take one tablet 13 hours, 7 hours, and 1 hour prior to scan.   PRESERVISION AREDS 2 PO Take by mouth.   sodium chloride HYPERTONIC 3 % nebulizer solution Take 5 mLs by nebulization every 4 (four) hours as needed.   Symbicort 160-4.5 MCG/ACT inhaler Generic drug: budesonide-formoterol SMARTSIG:2 Inhalation Via Inhaler Twice Daily   venlafaxine XR 150 MG 24 hr capsule Commonly known as: EFFEXOR-XR Take 150 mg by mouth daily.       Allergies  Allergen Reactions   Ivp Dye [Iodinated Contrast Media] Anaphylaxis   Charentais Melon (French Melon) Other (See Comments)    All melons cause stomach pain and upset  Codeine Other (See Comments)    Dizziness  Dizziness   Cucumber Extract    Demeclocycline Other (See Comments)    Stomach upset    Galcanezumab-Gnlm Itching   Repatha [Evolocumab] Dermatitis    LUMP AT AREA OF SHOT AND ITCHING   Sulfa Antibiotics Hives   Tegaderm Ag Mesh [Silver]    Telmisartan     dizziness Other reaction(s): Dizziness dizziness   Tetracyclines & Related     Stomach upset    Wild Lettuce Extract (Lactuca Virosa) Other (See Comments)    Stomach upset and pain    Follow-up Information     Barbette Reichmann, MD Follow up.   Specialty: Internal Medicine Why: Hospital follow up Contact information: [facility] [address] [place] [address] [phone]                  The results of significant diagnostics from this hospitalization (including imaging, microbiology, ancillary and laboratory) are listed below for reference.    Significant Diagnostic Studies: MR BRAIN WO CONTRAST Result Date: 12/04/2023 CLINICAL DATA:  Initial evaluation for acute neuro deficit, stroke suspected. EXAM: MRI HEAD WITHOUT CONTRAST TECHNIQUE: Multiplanar, multiecho pulse sequences of the  brain and surrounding structures were obtained without intravenous contrast. COMPARISON:  CT from 12/03/2023 and MRI from 08/17/2023 FINDINGS: Brain: Cerebral volume within normal limits. Patchy and confluent T2/FLAIR hyperintensity involving the periventricular and deep white matter both cerebral hemispheres as well as the pons, consistent with chronic small vessel ischemic disease, moderately advanced in nature. Remote lacunar infarct present at the right basal ganglia. No evidence for acute or subacute ischemia. No areas of chronic cortical infarction. No acute intracranial hemorrhage. Single punctate chronic microhemorrhage noted at the left thalamus. No mass lesion, midline shift or mass effect. Mild ventricular prominence related to global parenchymal volume loss without hydrocephalus. No extra-axial fluid collection. Pituitary gland within normal limits. Vascular: Major intracranial vascular flow voids are maintained. Skull and upper cervical spine: Craniocervical junction within normal limits. Bone marrow signal intensity normal. No scalp soft tissue abnormality. Sinuses/Orbits: Prior bilateral ocular lens replacement. Scattered mucosal thickening noted throughout the paranasal sinuses. No air-fluid levels. Trace right mastoid effusion, of doubtful significance. Other: None. IMPRESSION: 1. No acute intracranial abnormality. 2. Moderately advanced chronic microvascular ischemic disease with remote lacunar infarct at the right basal ganglia. Electronically Signed   By: Rise Mu M.D.   On: 12/04/2023 02:34   CT Head Wo Contrast Result Date: 12/03/2023 CLINICAL DATA:  Syncope or presyncope with cerebrovascular cause suspected. EXAM: CT HEAD WITHOUT CONTRAST TECHNIQUE: Contiguous axial images were obtained from the base of the skull through the vertex without intravenous contrast. RADIATION DOSE REDUCTION: This exam was performed according to the departmental dose-optimization program which  includes automated exposure control, adjustment of the mA and/or kV according to patient size and/or use of iterative reconstruction technique. COMPARISON:  MRI brain 08/17/2023.  CT head 08/17/2023. FINDINGS: Brain: Diffuse cerebral atrophy. Ventricular dilatation consistent with central atrophy. Low-attenuation changes in the deep white matter consistent with small vessel ischemia. No abnormal extra-axial fluid collections. No mass effect or midline shift. Gray-white matter junctions are distinct. Basal cisterns are not effaced. No acute intracranial hemorrhage. Old lacunar infarcts in the right basal ganglia Vascular: No hyperdense vessel or unexpected calcification. Skull: Normal. Negative for fracture or focal lesion. Sinuses/Orbits: Paranasal sinuses and mastoid air cells are clear. Postoperative bilateral ostiomeatal windows. Other: None. IMPRESSION: No acute intracranial abnormalities. Chronic atrophy and small vessel  ischemic changes. Electronically Signed   By: Burman Nieves M.D.   On: 12/03/2023 23:21   MYOCARDIAL AMYLOID IMAGING PLANAR AND SPECT Result Date: 11/11/2023   Myocardial uptake was positive for radiotracer uptake. The visual grade of myocardial uptake relative to the ribs was Grade 2 (Myocardial uptake equal to rib uptake).   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   Prior study not available for comparison.    Microbiology: No results found for this or any previous visit (from the past 240 hours).   Labs: Basic Metabolic Panel: Recent Labs  Lab 12/03/23 2238  NA 138  K 3.5  CL 100  CO2 27  GLUCOSE 95  BUN 30*  CREATININE 0.68  CALCIUM 9.0   Liver Function Tests: No results for input(s): "AST", "ALT", "ALKPHOS", "BILITOT", "PROT", "ALBUMIN" in the last 168 hours. No results for input(s): "LIPASE", "AMYLASE" in the last 168 hours. No results for input(s): "AMMONIA" in the last 168 hours. CBC: Recent Labs  Lab 12/03/23 2238  WBC 7.5   HGB 14.0  HCT 42.4  MCV 89.6  PLT 220   Cardiac Enzymes: No results for input(s): "CKTOTAL", "CKMB", "CKMBINDEX", "TROPONINI" in the last 168 hours. BNP: BNP (last 3 results) Recent Labs    02/11/23 1639  BNP 245.8*    ProBNP (last 3 results) No results for input(s): "PROBNP" in the last 8760 hours.  CBG: Recent Labs  Lab 12/03/23 2237  GLUCAP 63*       Signed:  Silvano Bilis MD.  [facility] 12/06/2023, 12:13 PM

## 2023-12-13 ENCOUNTER — Encounter: Payer: Medicare Other | Admitting: Internal Medicine

## 2024-01-10 ENCOUNTER — Encounter: Admitting: Internal Medicine

## 2024-01-10 NOTE — Progress Notes (Unsigned)
 [facility] CONSULT NOTE  Referring Physician: Barbette Reichmann, MD Primary Care: Barbette Reichmann, MD Primary Cardiologist: Julien Nordmann, MD  Chief Complaint: Heart failure  HPI:  Lindsey [name] is an 88 y.o. 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 Recently here bisoprolol and losartan had to be stopped due to orthostatic hypotension. A PYP scan was ordered. PYP on 11/11/23 was read as Grade II concerning for TTY cardiac amyloidosis. I have reviewed this study personally and think it is equivocal for the presence of cardiac amyloid.   Seen by [facility] and diagnosed with [facility] Labs show:  SPEP with a very small M-spike 0.2 with igG lambda light chain specificity UPEP no m-spike Free light changes normal  Echo 5/23 EF 40-45% (felt to be ischemic in nature) Echo 6/24 EF 55-60% Echo 11/24 EF 50-55% Mod LVH. No effusion. RV mildly HK   Recently admitted 3/1-12/2023 with worsening gait imbalance. MRI/MRA negative. 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 Abilify held     Past Medical History:  Diagnosis Date   Anemia    Atrial tachycardia (HCC)    Breast cancer (HCC)    Bronchiectasis (HCC) 06/2007   CAD (coronary artery disease)    a. 02/2022 Cath: LM nl, LAD 32m, D23 40, LCX 17m/d w/ L->L and R->L collats, RCA 10m-->Med Rx.   Cardiac amyloidosis (HCC)    a. 11/2023 PYP: + radiotracer uptake suggestive of ATTR.   Chronic Dizziness    Colitis    COPD (chronic obstructive pulmonary disease) (HCC)    Depression    Diastolic dysfunction    a. 12/2019 Echo: EF 60-65%, no rwma, GrII DD. Nl RV size/fxn. Mild MR/AI.   DVT (deep venous thrombosis) (HCC)    after her right knee  surgery.    Fibromyalgia    Heart failure with improved ejection fraction (HFimpEF) (HCC)    a. 02/2022 Echo: EF 40-45%, GrIII DD; b. 03/2023 Echo: EF 55-60%; c. 08/2023 Echo: EF 50-55%, mod LVH, GrI DD, low-nl RV fxn, ml PASP, mild MR/AI, mild-mod TR, AoV sclerosis.   Ischemic cardiomyopathy    a. 02/2022 Echo: EF 40-45%; b. 03/2023 Echo: EF 55-60%; c. 08/2023 Echo: EF 50-55%.   Labile Hypertension    Migraine headache with aura    Osteoarthritis    Osteoporosis    PAD (peripheral artery disease) (HCC)    Pneumonia    Polio 1952   Stenosis of carotid artery     Current Outpatient Medications  Medication Sig Dispense Refill   acetaminophen (TYLENOL) 500 MG tablet Take 500 mg by mouth at bedtime as needed for moderate pain.     albuterol (VENTOLIN HFA) 108 (90 Base) MCG/ACT inhaler Inhale 2 puffs into the lungs every 6 (six) hours as needed for wheezing or shortness of breath.     Alirocumab (PRALUENT) 150 MG/ML SOAJ Inject 1 mL (150 mg total) into the skin every 14 (fourteen) days. 2 mL 12   aspirin EC 81 MG tablet Take 1 tablet (81 mg total) by mouth daily. Swallow whole.     clopidogrel (PLAVIX) 75 MG tablet TAKE ONE TABLET EACH MORNING WITH BREAKFAST 90 tablet 3   cyanocobalamin (VITAMIN B12) 1000 MCG tablet Take 1 tablet (1,000 mcg total)  by mouth daily.     cyclobenzaprine (FLEXERIL) 5 MG tablet Take 5 mg by mouth at bedtime as needed.     doxazosin (CARDURA) 2 MG tablet Take 1 tablet (2 mg total) by mouth daily. 90 tablet 2   ezetimibe (ZETIA) 10 MG tablet TAKE 1 TABLET BY MOUTH DAILY. 90 tablet 0   furosemide (LASIX) 20 MG tablet Take 1 tablet (20 mg total) by mouth daily as needed (daily weight gain of 2 or more pounds). 45 tablet 3   hydrOXYzine (ATARAX/VISTARIL) 25 MG tablet Take 25 mg by mouth as needed for anxiety.     Multiple Vitamins-Minerals (PRESERVISION AREDS 2 PO) Take by mouth.     nitroGLYCERIN (NITROLINGUAL) 0.4 MG/SPRAY spray Place 1 spray under the tongue every 5  (five) minutes x 3 doses as needed for chest pain. 12 g 12   Omega-3 Fatty Acids (FISH OIL OMEGA-3 PO) Take 2 capsules by mouth at bedtime.     polyethylene glycol (MIRALAX / GLYCOLAX) packet Take 17 g by mouth daily as needed for mild constipation or moderate constipation.     potassium chloride (KLOR-CON M) 10 MEQ tablet TAKE 1 TABLET BY MOUTH DAILY 90 tablet 3   predniSONE (DELTASONE) 50 MG tablet Take one tablet 13 hours, 7 hours, and 1 hour prior to scan. 3 tablet 0   sodium chloride HYPERTONIC 3 % nebulizer solution Take 5 mLs by nebulization every 4 (four) hours as needed.     SYMBICORT 160-4.5 MCG/ACT inhaler SMARTSIG:2 Inhalation Via Inhaler Twice Daily     venlafaxine XR (EFFEXOR-XR) 150 MG 24 hr capsule Take 150 mg by mouth daily.     No current facility-administered medications for this visit.    Allergies  Allergen Reactions   Ivp Dye [Iodinated Contrast Media] Anaphylaxis   Charentais Melon (French Melon) Other (See Comments)    All melons cause stomach pain and upset   Codeine Other (See Comments)    Dizziness  Dizziness   Cucumber Extract    Demeclocycline Other (See Comments)    Stomach upset    Galcanezumab-Gnlm Itching   Repatha [Evolocumab] Dermatitis    LUMP AT AREA OF SHOT AND ITCHING   Sulfa Antibiotics Hives   Tegaderm Ag Mesh [Silver]    Telmisartan     dizziness Other reaction(s): Dizziness dizziness   Tetracyclines & Related     Stomach upset    Wild Lettuce Extract (Lactuca Virosa) Other (See Comments)    Stomach upset and pain      Social History   Socioeconomic History   Marital status: Widowed    Spouse name: Not on file   Number of children: Not on file   Years of education: Not on file   Highest education level: Not on file  Occupational History   Not on file  Tobacco Use   Smoking status: Never   Smokeless tobacco: Never  Vaping Use   Vaping status: Never Used  Substance and Sexual Activity   Alcohol use: Yes    Alcohol/week:  4.0 standard drinks of alcohol    Types: 4 Glasses of wine per week    Comment: a glass of wine every other day   Drug use: No   Sexual activity: Not on file  Other Topics Concern   Not on file  Social History Narrative   Not on file   Social Drivers of Health   Financial Resource Strain: Low Risk  (11/10/2023)   Received from [facility]  Overall Financial Resource Strain (CARDIA)    Difficulty of Paying Living Expenses: Not hard at all  Food Insecurity: No Food Insecurity (12/04/2023)   Hunger Vital Sign    Worried About Running Out of Food in the Last Year: Never true    Ran Out of Food in the Last Year: Never true  Transportation Needs: No Transportation Needs (12/04/2023)   PRAPARE - Administrator, Civil Service (Medical): No    Lack of Transportation (Non-Medical): No  Physical Activity: Not on file  Stress: Not on file  Social Connections: Moderately Integrated (12/04/2023)   Social Connection and Isolation Panel [NHANES]    Frequency of Communication with Friends and Family: More than three times a week    Frequency of Social Gatherings with Friends and Family: Three times a week    Attends Religious Services: 1 to 4 times per year    Active Member of Clubs or Organizations: Yes    Attends Banker Meetings: 1 to 4 times per year    Marital Status: Widowed  Intimate Partner Violence: Not At Risk (12/04/2023)   Humiliation, Afraid, Rape, and Kick questionnaire    Fear of Current or Ex-Partner: No    Emotionally Abused: No    Physically Abused: No    Sexually Abused: No      Family History  Problem Relation Age of Onset   Pancreatic cancer Mother    Arthritis Mother    Ovarian cancer Mother    Arthritis Father    Bladder Cancer Father    Atrial fibrillation Sister    Endometrial cancer Sister    Colon cancer Sister     There were no vitals filed for this visit.  PHYSICAL EXAM: General:  Well appearing. No respiratory  difficulty HEENT: normal Neck: supple. no JVD. Carotids 2+ bilat; no bruits. No lymphadenopathy or thryomegaly appreciated. Cor: PMI nondisplaced. Regular rate & rhythm. No rubs, gallops or murmurs. Lungs: clear Abdomen: soft, nontender, nondistended. No hepatosplenomegaly. No bruits or masses. Good bowel sounds. Extremities: no cyanosis, clubbing, rash, edema Neuro: alert & oriented x 3, cranial nerves grossly intact. moves all 4 extremities w/o difficulty. Affect pleasant.  ECG:   ASSESSMENT & PLAN:  1. Chronic heart failure with improved ejection fraction/abnormal PYP scan: - Echo 5/23 EF 40-45% (felt to be ischemic in nature) - Echo 6/24 EF 55-60% - Echo 11/24 EF 50-55% Mod LVH. No effusion. RV mildly HK  - PYP 11/11/23 read as Grade II concerning for TTY cardiac amyloidosis. I have reviewed this study personally and think it is equivocal for the presence of cardiac amyloid.  - SPEP with a very small M-spike 0.2 with igG lambda light chain specificity - UPEP no m-spike - Free light changes normal - Based on available evidence, I am not convinced that she has cardiac amyloidosis. Testing is negative for AL amyloidosis and PYP is equivocal at worst - Will get cMRI to evaluate for infiltrative process - Check genetic panel  2.  Orthostatic Hypotension/Primary Hypertension:  - suspect this is primarily related to age and potential Parkinson's disease - suspicion for clinically-significant amyloidosis is low -> will get cMRI and TTR genetic testing as above as above - suggest compression hose, avoid overdiuresis and add midodrine as needed   3.   Coronary artery disease: Known occlusion of the left circumflex by catheterization May 2023 with the left and right to left collaterals and otherwise nonobstructive disease.  She has done well without chest pain  or dyspnea.  She remains on aspirin, Plavix, Zetia, and Praluent.   Arvilla Meres, MD  1:46 PM

## 2024-01-15 ENCOUNTER — Emergency Department
Admission: EM | Admit: 2024-01-15 | Discharge: 2024-01-15 | Disposition: A | Discharge status: Home / Self Care | Attending: Emergency Medicine | Admitting: Emergency Medicine

## 2024-01-15 ENCOUNTER — Emergency Department

## 2024-01-15 ENCOUNTER — Other Ambulatory Visit: Payer: Self-pay

## 2024-01-15 DIAGNOSIS — R079 Chest pain, unspecified: Secondary | ICD-10-CM | POA: Insufficient documentation

## 2024-01-15 DIAGNOSIS — I509 Heart failure, unspecified: Secondary | ICD-10-CM | POA: Insufficient documentation

## 2024-01-15 LAB — COMPREHENSIVE METABOLIC PANEL WITH GFR
ALT: 25 U/L (ref 0–44)
AST: 49 U/L — ABNORMAL HIGH (ref 15–41)
Albumin: 3.9 g/dL (ref 3.5–5.0)
Alkaline Phosphatase: 79 U/L (ref 38–126)
Anion gap: 8 (ref 5–15)
BUN: 16 mg/dL (ref 8–23)
CO2: 31 mmol/L (ref 22–32)
Calcium: 8.8 mg/dL — ABNORMAL LOW (ref 8.9–10.3)
Chloride: 98 mmol/L (ref 98–111)
Creatinine, Ser: 0.64 mg/dL (ref 0.44–1.00)
GFR, Estimated: >60 mL/min (ref >60)
Glucose, Bld: 92 mg/dL (ref 70–99)
Potassium: 3.7 mmol/L (ref 3.5–5.1)
Sodium: 137 mmol/L (ref 135–145)
Total Bilirubin: 0.7 mg/dL (ref 0.0–1.2)
Total Protein: 6.5 g/dL (ref 6.5–8.1)

## 2024-01-15 LAB — CBC WITH DIFFERENTIAL/PLATELET
Abs Immature Granulocytes: 0.02 10*3/uL (ref 0.00–0.07)
Basophils Absolute: 0.1 10*3/uL (ref 0.0–0.1)
Basophils Relative: 1 %
Eosinophils Absolute: 0.6 10*3/uL — ABNORMAL HIGH (ref 0.0–0.5)
Eosinophils Relative: 8 %
HCT: 42.9 % (ref 36.0–46.0)
Hemoglobin: 14.2 g/dL (ref 12.0–15.0)
Immature Granulocytes: 0 %
Lymphocytes Relative: 22 %
Lymphs Abs: 1.5 10*3/uL (ref 0.7–4.0)
MCH: 29.6 pg (ref 26.0–34.0)
MCHC: 33.1 g/dL (ref 30.0–36.0)
MCV: 89.4 fL (ref 80.0–100.0)
Monocytes Absolute: 0.5 10*3/uL (ref 0.1–1.0)
Monocytes Relative: 8 %
Neutro Abs: 4.1 10*3/uL (ref 1.7–7.7)
Neutrophils Relative %: 61 %
Platelets: 221 10*3/uL (ref 150–400)
RBC: 4.8 MIL/uL (ref 3.87–5.11)
RDW: 14.1 % (ref 11.5–15.5)
WBC: 6.7 10*3/uL (ref 4.0–10.5)
nRBC: 0 % (ref 0.0–0.2)

## 2024-01-15 LAB — TROPONIN I (HIGH SENSITIVITY)
Troponin I (High Sensitivity): 13 ng/L (ref <18)
Troponin I (High Sensitivity): 15 ng/L (ref <18)

## 2024-01-15 LAB — BRAIN NATRIURETIC PEPTIDE: B Natriuretic Peptide: 88.9 pg/mL (ref 0.0–100.0)

## 2024-01-15 NOTE — ED Notes (Signed)
 Up to b/r with assist, steady gait. Denies pain, sob, nausea, weakness, or dizziness.

## 2024-01-15 NOTE — ED Notes (Addendum)
 Back from xray. EDP at [facility]. Alert, NAD, calm, interactive, resps e/u, speaking clearly.

## 2024-01-15 NOTE — ED Triage Notes (Signed)
 Pt via [facility] from home. Pt c/o CP that started this AM report it comes and goes, states it last for a min and then is gone a couple of hours. Reports taking 2 Nitroglycerin tabs at home but CP still continued to come back. Pt has a hx of CHF and reports increased swelling in her legs but denies any SOB. Denies any pain now. Pt is A&Ox4 and NAD

## 2024-01-15 NOTE — ED Notes (Signed)
Pt not in room, pt in xray.

## 2024-01-15 NOTE — ED Provider Notes (Signed)
 [facility] Provider Note    Event Date/Time   First MD Initiated Contact with Patient 01/15/24 1516     (approximate)   History   Chest Pain   HPI  Lindsey [name] is a 88 y.o. female who presents to the emergency department today because of concerns for chest pain.  Patient has now had a couple episodes of chest pain today.  She states they will last a few minutes although she did take nitroglycerin.  Located in the center chest she describes it as pressure-like.  She denies any associated shortness of breath.  It does not radiate.  No nausea or vomiting.  States she has a history of MI and CHF.  She denies any recent illness or fevers.      Physical Exam   Triage Vital Signs: ED Triage Vitals  Encounter Vitals Group     BP 01/15/24 1525 (!) 161/107     Systolic BP Percentile --      Diastolic BP Percentile --      Pulse Rate 01/15/24 1522 74     Resp 01/15/24 1522 13     Temp 01/15/24 1525 98.1 F (36.7 C)     Temp Source 01/15/24 1525 Oral     SpO2 01/15/24 1522 100 %     Weight 01/15/24 1523 125 lb (56.7 kg)     Height 01/15/24 1523 5\' 2"  (1.575 m)     Head Circumference --      Peak Flow --      Pain Score 01/15/24 1522 0     Pain Loc --      Pain Education --      Exclude from Growth Chart --     Most recent vital signs: Vitals:   01/15/24 1528 01/15/24 1529  BP:    Pulse: 72 71  Resp: 15 15  Temp:    SpO2: 96% 96%   General: Awake, alert, oriented. CV:  Good peripheral perfusion.  Resp:  Normal effort. Lungs clear. Abd:  No distention. Non tender.   ED Results / Procedures / Treatments   Labs (all labs ordered are listed, but only abnormal results are displayed) Labs Reviewed  COMPREHENSIVE METABOLIC PANEL WITH GFR - Abnormal; Notable for the following components:      Result Value   Calcium 8.8 (*)    AST 49 (*)    All other components within normal limits  CBC WITH DIFFERENTIAL/PLATELET - Abnormal; Notable for  the following components:   Eosinophils Absolute 0.6 (*)    All other components within normal limits  BRAIN NATRIURETIC PEPTIDE  TROPONIN I (HIGH SENSITIVITY)  TROPONIN I (HIGH SENSITIVITY)     EKG  I, Marylynn Soho, attending physician, personally viewed and interpreted this EKG  EKG Time: 1522 Rate: 72 Rhythm: sinus rhythm Axis: left axis deviation Intervals: qtc 507 QRS: IVCD ST changes: no st elevation Impression: abnormal ekg   RADIOLOGY I independently interpreted and visualized the CXR. My interpretation: No pneumonia Radiology interpretation:  IMPRESSION:  Mild cardiomegaly. No acute cardiopulmonary process.      PROCEDURES:  Critical Care performed: No   MEDICATIONS ORDERED IN ED: Medications - No data to display   IMPRESSION / MDM / ASSESSMENT AND PLAN / ED COURSE  I reviewed the triage vital signs and the nursing notes.  Differential diagnosis includes, but is not limited to, ACS, pneumonia, PE, esophagitis  Patient's presentation is most consistent with acute presentation with potential threat to life or bodily function.   The patient is on the cardiac monitor to evaluate for evidence of arrhythmia and/or significant heart rate changes.  Patient presents to the emergency department today because of concerns for a begin episodes of chest pain.  Stat my exam patient is no longer having the pain.  EKG without ST elevation.  Will check chest x-ray, blood work including troponin.  Chest x-ray without concerning abnormalities.  Troponin was negative x 2.  Blood work without any other concerning abnormalities.  Patient without any further episodes of chest pain here in the emergency department.  At this time given reassuring workup and lack of further episodes I think it is reasonable for patient be discharged.      FINAL CLINICAL IMPRESSION(S) / ED DIAGNOSES   Final diagnoses:  Nonspecific chest pain     Note:   This document was prepared using Dragon voice recognition software and may include unintentional dictation errors.    Marylynn Soho, MD 01/15/24 [phone]

## 2024-01-17 ENCOUNTER — Encounter: Payer: Self-pay | Admitting: Cardiovascular Disease

## 2024-02-09 ENCOUNTER — Telehealth: Payer: Self-pay | Admitting: Family

## 2024-02-09 NOTE — Progress Notes (Unsigned)
 ADVANCED HF CLINIC NOTE  Referring Physician: Antonio Baumgarten, MD Primary Care: Antonio Baumgarten, MD (last seen 05/25)  Primary Cardiologist: Timothy Gollan, MD / Laneta Pintos, NP (last seen 02/25)  Chief Complaint: fatigue  HPI:  Lindsey [name] is an 88 y.o. 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 Her bisoprolol  and losartan  had to be stopped due to orthostatic hypotension. A PYP scan was ordered. PYP on 11/11/23 was read as Grade II concerning for TTY cardiac amyloidosis. Dr Julane Ny reviewed this study personally and thinks it is equivocal for the presence of cardiac amyloid.   Seen by [facility] and diagnosed with [facility] Labs show:  SPEP with a very small M-spike 0.2 with igG lambda light chain specificity UPEP no m-spike Free light changes normal  Echo 5/23 EF 40-45% (felt to be ischemic in nature) Echo 6/24 EF 55-60% Echo 11/24 EF 50-55% Mod LVH. No effusion. RV mildly HK   Recently admitted 3/1-12/2023 with worsening gait imbalance. MRI/MRA negative. 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 Abilify  held   She presents today for her initial HF visit with a chief complaint of fatigue. Has associated shortness of breath, occasional chest pain, dizziness with sudden position changes and swelling mostly around her right ankle. Denies palpitations, abdominal distention or difficulty sleeping. Reports having episodes where her BP gets really low and then it'll get really high. She's unable to tolerate compression socks due to discomfort.   Drinking 50-60 oz fluids daily. Drinking 1 glass of white wine at dinner    ROS: All systems negative except what is listed in HPI, PMH and  Problem List  Past Medical History:  Diagnosis Date   Anemia    Atrial tachycardia (HCC)    Breast cancer (HCC)    Bronchiectasis (HCC) 06/2007   CAD (coronary artery disease)    a. 02/2022 Cath: LM nl, LAD 58m, D23 40, LCX 133m/d w/ L->L and R->L collats, RCA 26m-->Med Rx.   Cardiac amyloidosis (HCC)    a. 11/2023 PYP: + radiotracer uptake suggestive of ATTR.   Chronic Dizziness    Colitis    COPD (chronic obstructive pulmonary disease) (HCC)    Depression    Diastolic dysfunction    a. 12/2019 Echo: EF 60-65%, no rwma, GrII DD. Nl RV size/fxn. Mild MR/AI.   DVT (deep venous thrombosis) (HCC)    after her right knee surgery.    Fibromyalgia    Heart failure with improved ejection fraction (HFimpEF) (HCC)    a. 02/2022 Echo: EF 40-45%, GrIII DD; b. 03/2023 Echo: EF 55-60%; c. 08/2023 Echo: EF 50-55%, mod LVH, GrI DD, low-nl RV fxn, ml PASP, mild MR/AI, mild-mod TR, AoV sclerosis.   Ischemic cardiomyopathy    a. 02/2022 Echo: EF 40-45%; b. 03/2023 Echo: EF 55-60%; c. 08/2023 Echo: EF 50-55%.   Labile Hypertension    Migraine headache with aura    Osteoarthritis    Osteoporosis    PAD (peripheral artery disease) (HCC)    Pneumonia    Polio 1952   Stenosis of carotid artery     Current Outpatient Medications  Medication Sig Dispense Refill   acetaminophen  (TYLENOL ) 500 MG tablet Take 500 mg by mouth at bedtime as needed for moderate pain.  albuterol  (VENTOLIN  HFA) 108 (90 Base) MCG/ACT inhaler Inhale 2 puffs into the lungs every 6 (six) hours as needed for wheezing or shortness of breath.     Alirocumab  (PRALUENT ) 150 MG/ML SOAJ Inject 1 mL (150 mg total) into the skin every 14 (fourteen) days. 2 mL 12   aspirin  EC 81 MG tablet Take 1 tablet (81 mg total) by mouth daily. Swallow whole.     clopidogrel  (PLAVIX ) 75 MG tablet TAKE ONE TABLET EACH MORNING WITH BREAKFAST 90 tablet 3   cyanocobalamin  (VITAMIN B12) 1000 MCG tablet Take 1 tablet (1,000 mcg total) by mouth daily.      cyclobenzaprine  (FLEXERIL ) 5 MG tablet Take 5 mg by mouth at bedtime as needed.     doxazosin  (CARDURA ) 2 MG tablet Take 1 tablet (2 mg total) by mouth daily. 90 tablet 2   ezetimibe  (ZETIA ) 10 MG tablet TAKE 1 TABLET BY MOUTH DAILY. 90 tablet 0   furosemide  (LASIX ) 20 MG tablet Take 1 tablet (20 mg total) by mouth daily as needed (daily weight gain of 2 or more pounds). 45 tablet 3   hydrOXYzine  (ATARAX /VISTARIL ) 25 MG tablet Take 25 mg by mouth as needed for anxiety.     Multiple Vitamins-Minerals (PRESERVISION AREDS 2 PO) Take by mouth.     nitroGLYCERIN  (NITROLINGUAL ) 0.4 MG/SPRAY spray Place 1 spray under the tongue every 5 (five) minutes x 3 doses as needed for chest pain. 12 g 12   Omega-3 Fatty Acids (FISH OIL OMEGA-3 PO) Take 2 capsules by mouth at bedtime.     polyethylene glycol (MIRALAX  / GLYCOLAX ) packet Take 17 g by mouth daily as needed for mild constipation or moderate constipation.     potassium chloride  (KLOR-CON  M) 10 MEQ tablet TAKE 1 TABLET BY MOUTH DAILY 90 tablet 3   predniSONE  (DELTASONE ) 50 MG tablet Take one tablet 13 hours, 7 hours, and 1 hour prior to scan. 3 tablet 0   sodium chloride  HYPERTONIC 3 % nebulizer solution Take 5 mLs by nebulization every 4 (four) hours as needed.     SYMBICORT 160-4.5 MCG/ACT inhaler SMARTSIG:2 Inhalation Via Inhaler Twice Daily     venlafaxine  XR (EFFEXOR -XR) 150 MG 24 hr capsule Take 150 mg by mouth daily.     No current facility-administered medications for this visit.    Allergies  Allergen Reactions   Ivp Dye [Iodinated Contrast Media] Anaphylaxis   Charentais Melon (French Melon) Other (See Comments)    All melons cause stomach pain and upset   Codeine Other (See Comments)    Dizziness  Dizziness   Cucumber Extract    Demeclocycline Other (See Comments)    Stomach upset    Galcanezumab -Gnlm Itching   Repatha  [Evolocumab ] Dermatitis    LUMP AT AREA OF SHOT AND ITCHING   Sulfa Antibiotics Hives   Tegaderm Ag Mesh [Silver]     Telmisartan      dizziness Other reaction(s): Dizziness dizziness   Tetracyclines & Related     Stomach upset    Wild Lettuce Extract (Lactuca Virosa) Other (See Comments)    Stomach upset and pain      Social History   Socioeconomic History   Marital status: Widowed    Spouse name: Not on file   Number of children: Not on file   Years of education: Not on file   Highest education level: Not on file  Occupational History   Not on file  Tobacco Use   Smoking status: Never   Smokeless tobacco: Never  Vaping Use   Vaping status: Never Used  Substance and Sexual Activity   Alcohol  use: Yes    Alcohol /week: 4.0 standard drinks of alcohol     Types: 4 Glasses of wine per week    Comment: a glass of wine every other day   Drug use: No   Sexual activity: Not on file  Other Topics Concern   Not on file  Social History Narrative   Not on file   Social Drivers of Health   Financial Resource Strain: Low Risk  (02/08/2024)   Received from [facility]   Overall Financial Resource Strain (CARDIA)    Difficulty of Paying Living Expenses: Not hard at all  Food Insecurity: No Food Insecurity (02/08/2024)   Received from [facility]   Hunger Vital Sign    Worried About Running Out of Food in the Last Year: Never true    Ran Out of Food in the Last Year: Never true  Transportation Needs: No Transportation Needs (02/08/2024)   Received from [facility] - Transportation    In the past 12 months, has lack of transportation kept you from medical appointments or from getting medications?: No    Lack of Transportation (Non-Medical): No  Physical Activity: Not on file  Stress: Not on file  Social Connections: Moderately Integrated (12/04/2023)   Social Connection and Isolation Panel [NHANES]    Frequency of Communication with Friends and Family: More than three times a week    Frequency of Social Gatherings with Friends and  Family: Three times a week    Attends Religious Services: 1 to 4 times per year    Active Member of Clubs or Organizations: Yes    Attends Banker Meetings: 1 to 4 times per year    Marital Status: Widowed  Intimate Partner Violence: Not At Risk (12/04/2023)   Humiliation, Afraid, Rape, and Kick questionnaire    Fear of Current or Ex-Partner: No    Emotionally Abused: No    Physically Abused: No    Sexually Abused: No      Family History  Problem Relation Age of Onset   Pancreatic cancer Mother    Arthritis Mother    Ovarian cancer Mother    Arthritis Father    Bladder Cancer Father    Atrial fibrillation Sister    Endometrial cancer Sister    Colon cancer Sister    Vitals:   02/10/24 1032  BP: (!) 158/96  Pulse: 76  SpO2: 96%  Weight: 125 lb 6.4 oz (56.9 kg)   Wt Readings from Last 3 Encounters:  02/10/24 125 lb 6.4 oz (56.9 kg)  01/15/24 125 lb (56.7 kg)  12/04/23 119 lb 0.8 oz (54 kg)   Lab Results  Component Value Date   CREATININE 0.64 01/15/2024   CREATININE 0.68 12/03/2023   CREATININE 0.51 08/23/2023    PHYSICAL EXAM:  General: Well appearing. No resp difficulty HEENT: normal Neck: supple, no JVD Cor: Regular rhythm, rate. No rubs, gallops or murmurs Lungs: clear Abdomen: soft, nontender, nondistended. Extremities: no cyanosis, clubbing, rash, nonpitting edema around right ankle Neuro: alert & oriented X 3. Moves all 4 extremities w/o difficulty. Affect pleasant   ECG: 01/17/24 NSR   ASSESSMENT & PLAN:  1. Chronic heart failure with improved ejection fraction/abnormal PYP scan: - Echo 5/23 EF 40-45% (felt to be ischemic in nature) - Echo 6/24 EF 55-60% - Echo 11/24 EF 50-55% Mod LVH. No effusion.  RV mildly HK  - PYP 11/11/23 read as Grade II concerning for TTY cardiac amyloidosis. Dr Julane Ny reviewed this study personally and thinks it is equivocal for the presence of cardiac amyloid.  - SPEP with a very small M-spike 0.2 with igG  lambda light chain specificity - UPEP no m-spike - Free light changes normal - Based on available evidence, Dr Julane Ny is not convinced that she has cardiac amyloidosis. Testing is negative for AL amyloidosis and PYP is equivocal at worst - cMRI ordered to evaluate for infiltrative process - Check genetic panel  2.  Orthostatic Hypotension/Primary Hypertension:  - suspect this is primarily related to age and potential Parkinson's disease - suspicion for clinically-significant amyloidosis is low -> will get cMRI and TTR genetic testing as above  - BP 158/96 - unable to tolerate compression socks as they are painful - will stop amlodipine  due to pedal edema and try lisinopril 2.5mg  daily - check BP daily but vary the time of day that she checks it - BMET 01/15/24 reviewed: sodium 137, potassium 3.7, creatinine 0.64 & GFR >60   3.   Coronary artery disease: Known occlusion of the left circumflex by catheterization May 2023 with the left and right to left collaterals and otherwise nonobstructive disease.   - No angina - She remains on aspirin , Plavix , Zetia , and Praluent .   Return in 1 month to see MD, sooner if needed.    Charlette Console, FNP  1:32 PM 02/10/24

## 2024-02-09 NOTE — Telephone Encounter (Signed)
 Called to confirm/remind patient of their appointment at the [facility] on 02/10/24.   Appointment:   [x] Confirmed  [] Left mess   [] No answer/No voice mail  [] VM Full/unable to leave message  [] Phone not in service  Patient reminded to bring all medications and/or complete list.  Confirmed patient has transportation. Gave directions, instructed to utilize valet parking.

## 2024-02-10 ENCOUNTER — Telehealth: Payer: Self-pay

## 2024-02-10 ENCOUNTER — Encounter: Payer: Self-pay | Admitting: Family

## 2024-02-10 ENCOUNTER — Other Ambulatory Visit (HOSPITAL_COMMUNITY): Payer: Self-pay

## 2024-02-10 ENCOUNTER — Ambulatory Visit: Attending: Family | Admitting: Family

## 2024-02-10 VITALS — BP 158/96 | HR 76 | Wt 125.4 lb

## 2024-02-10 DIAGNOSIS — D472 Monoclonal gammopathy: Secondary | ICD-10-CM | POA: Diagnosis not present

## 2024-02-10 DIAGNOSIS — I5032 Chronic diastolic (congestive) heart failure: Secondary | ICD-10-CM | POA: Diagnosis not present

## 2024-02-10 DIAGNOSIS — Z7982 Long term (current) use of aspirin: Secondary | ICD-10-CM | POA: Insufficient documentation

## 2024-02-10 DIAGNOSIS — Z86718 Personal history of other venous thrombosis and embolism: Secondary | ICD-10-CM | POA: Diagnosis not present

## 2024-02-10 DIAGNOSIS — I43 Cardiomyopathy in diseases classified elsewhere: Secondary | ICD-10-CM | POA: Diagnosis present

## 2024-02-10 DIAGNOSIS — Z7902 Long term (current) use of antithrombotics/antiplatelets: Secondary | ICD-10-CM | POA: Insufficient documentation

## 2024-02-10 DIAGNOSIS — J479 Bronchiectasis, uncomplicated: Secondary | ICD-10-CM | POA: Diagnosis not present

## 2024-02-10 DIAGNOSIS — E854 Organ-limited amyloidosis: Secondary | ICD-10-CM | POA: Diagnosis present

## 2024-02-10 DIAGNOSIS — I739 Peripheral vascular disease, unspecified: Secondary | ICD-10-CM | POA: Diagnosis not present

## 2024-02-10 DIAGNOSIS — I2510 Atherosclerotic heart disease of native coronary artery without angina pectoris: Secondary | ICD-10-CM | POA: Diagnosis not present

## 2024-02-10 DIAGNOSIS — I951 Orthostatic hypotension: Secondary | ICD-10-CM | POA: Diagnosis not present

## 2024-02-10 DIAGNOSIS — I110 Hypertensive heart disease with heart failure: Secondary | ICD-10-CM | POA: Diagnosis not present

## 2024-02-10 DIAGNOSIS — I25810 Atherosclerosis of coronary artery bypass graft(s) without angina pectoris: Secondary | ICD-10-CM

## 2024-02-10 DIAGNOSIS — Z79899 Other long term (current) drug therapy: Secondary | ICD-10-CM | POA: Diagnosis not present

## 2024-02-10 MED ORDER — LISINOPRIL 2.5 MG PO TABS
2.5000 mg | ORAL_TABLET | Freq: Every day | ORAL | 3 refills | Status: DC
Start: 2024-02-10 — End: 2024-04-17

## 2024-02-10 NOTE — Patient Instructions (Signed)
 Medication Changes:  START Lisinopril 2.5mg  (1 tab) daily  STOP Amlodipine   Lab Work:  Market researcher testing has been collected, this has to be sent to [place]  for processing and can take 1-2 weeks for us  to get results back.  We will let you know the results once reviewed by your provider.   Testing/Procedures:  Your physician has requested that you have a cardiac MRI. Cardiac MRI uses a computer to create images of your heart as its beating, producing both still and moving pictures of your heart and major blood vessels. For further information please visit [email or web address]. Please follow the instruction sheet given to you today for more information. SOMEONE WILL BE IN CONTACT WITH YOU IN ORDER TO SCHEDULE YOUR APPOINTMENT.   Labs will need to be done within 2 weeks of your cardiac MRI. The orders have been placed. You will simply need to go to the medical mall during business hours 8am-4pm in order to get your labs drawn WITHIN TWO WEEKS OF YOUR CARDIAC MRI.  Follow-Up in: Please follow up with the [facility] with Dr. Bensimhon in 1 month.  At the [facility], you and your health needs are our priority. We have a designated team specialized in the treatment of Heart Failure. This Care Team includes your primary Heart Failure Specialized Cardiologist (physician), Advanced Practice Providers (APPs- Physician Assistants and Nurse Practitioners), and Pharmacist who all work together to provide you with the care you need, when you need it.   You may see any of the following providers on your designated Care Team at your next follow up:  Dr. Jules Oar Dr. Peder Bourdon Dr. Alwin Baars Dr. Judyth Nunnery Shawnee Dellen, FNP Bevely Brush, RPH-CPP  Please be sure to bring in all your medications bottles to every appointment.   Need to Contact Us :  If you have any questions or concerns before your next appointment please send us  a message through  [place] or call our office at [phone].    TO LEAVE A MESSAGE FOR THE NURSE SELECT OPTION 2, PLEASE LEAVE A MESSAGE INCLUDING: YOUR NAME DATE OF BIRTH CALL BACK NUMBER REASON FOR CALL**this is important as we prioritize the call backs  YOU WILL RECEIVE A CALL BACK THE SAME DAY AS LONG AS YOU CALL BEFORE 4:00 PM

## 2024-02-10 NOTE — Progress Notes (Signed)
 [facility] - HEART FAILURE CLINIC - PHARMACIST COUNSELING NOTE  Lindsey [name] is a 88 y.o. year old female who presents to the HF clinic for an initial appointment. 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 They were diagnosed with HFrEF secondary to unknown etiology now improved to an EF of 50-55%. They previously had their losartan  and bisoprolol  stopped due to orthostatic hypotension. They recently were started on amlodipine  5 mg daily.   Current Guideline-Directed Medical Therapy (GDMT)  ACE/ARB/ARNI: previously stopped due to orthostatic hypotension  Beta Blocker: Bisoprolol  5 mg daily  Aldosterone Antagonist: not started due to orthostatic hypotension Diuretic: Taking 1 10 mg tablet as needed if they gain 1 - 0.5 lbs over night  -- 10 mEq of potassium daily  SGLT2i: not started due to orthostatic hypotension   Vital Signs and Trends  Recent Trends BP Readings from Last 3 Encounters:  01/15/24 (!) 184/106  12/06/23 (!) 152/93  12/03/23 130/82   Wt Readings from Last 3 Encounters:  01/15/24 125 lb (56.7 kg)  12/04/23 119 lb 0.8 oz (54 kg)  12/03/23 126 lb 6.4 oz (57.3 kg)   Today's visit HR 76 BP 158/96 Weight (pounds) 125.4 lbs  Do you check your weight daily? [x] Yes [] No  Do you check you blood pressure daily [] Yes [x] No  PTA Weight: 122 lbs (going up a little bit) PTA BP: 130s - 150s / 80s   Cardiac Imaging  Echo 5/23 EF 40-45% (felt to be ischemic in nature) Echo 6/24 EF 55-60% Echo 11/24 EF 50-55% Mod LVH. No effusion. RV mildly HK   Changes Since Last Visit   Hospitalizations/ED visits (last 6 months):  -- Date 01/15/2024, [facility] Non-specific chest pain  Medication changes: NA  Pertinent Labs  Lab Results  Component Value  Date   NA 137 01/15/2024   CL 98 01/15/2024   K 3.7 01/15/2024   CO2 31 01/15/2024   BUN 16 01/15/2024   CREATININE 0.64 01/15/2024   GFRNONAA >60 01/15/2024   CALCIUM  8.8 (L) 01/15/2024   ALBUMIN 3.9 01/15/2024   GLUCOSE 92 01/15/2024   Lab Results  Component Value Date   LDLCALC 33 12/04/2023   Lab Results  Component Value Date   HGBA1C 5.7 (H) 08/17/2023   ASSESSMENT  Reason for today's visit   To assess current guideline directed medical therapy. A little bit of fluid in the legs is pooling in a golf-ball like shape under her ankle, likely due to the amlodipine . as they say they don't carry fluid generally in their legs. Patient reported this also feels different. For lipid management, patient said they have stopped the Praluent  for sometime due to side effects (making them itchy), their LDL seems controlled without the PCSK9i.   Guideline-Directed Medical Therapy Evaluation  ACEi/ARB/ARNi Patient is not on therapy because is hasn't been started yet   Beta-Blocker  Target Achieved [] Yes [x] No Up-titration candidate today [] Yes [x] No Safety Concerns []  Bradycardia []  Hypotension []  Asthma/COPD   MRA Patient is not on therapy because of orthostatic hypotension  SGLT-2 Patient is not on therapy because of orthostatic hypotension   Loop  Increase dose today  [] Yes [x] No Safety Concerns []  Hypotension []  Hypokalemia []  Increased Scr   Heart Failure Symptoms & Volume Status   Dyspnea on exertion [] Yes [x] No  Fatigue [] Yes [  x]No  Lower extremity edema, tiny bit of fluid in the ankles [x] Yes [] No  Weight gain (> 5 lbs), steadily uptrending  [x] Yes [] No  Cough or wheezing  [] Yes [x] No  Abdominal bloating/Discomfort [] Yes [x] No  Early satiety or poor appetite [] Yes [x] No  Dizziness of lightheadedness, orthostatic HTN [] Yes [x] No  # Pillows used at night: None   Adherence Assessment  Do you ever forget to take your medication? [] Yes [x] No  Do you ever  skip doses due to side effects? [] Yes [x] No  Do you have trouble affording your medicines? [] Yes [x] No  Are you ever unable to pick up your medication due to transportation difficulties? [] Yes [x] No  Do you ever stop taking your medications because you don't believe they are helping? [] Yes [x] No   Adherence strategy: Have a red zipper bag and keep them all in there  Barriers to obtaining medications: None identified  Did you take your medications before your appointment today: [x] Yes [] No  Preventative Care   Parameter Most Recent Result Goal/Status Current Medications  LDL 33 < 70 - 55 mg/dL controlled Statin: Atorvastatin  40 mg daily Zetia  10 mg daily    A1c 5.7  7 - 8 % In goal Not diabetic   BP Control 158/96 < 130/80 uncontrolled Additional: Amlodipine  5 mg daily   ECHO 50-55% 6 - 12 months Last 11/24   PLAN  Medication Interventions:  -- Consider stopping the amlodipine  due to petal edema and re-start them on losartan  25 mg daily -- GDMT is limited by orthostatic hypotension  -- Continue current medication regimen per NP Preventative Care Follow-up:  -- NA Lab or imaging orders:  -- Last BMET 01/15/24   Time spent: 15 minutes  Thank you for allowing pharmacy to participate in this patient's care.   Marizol Borror K Maui Ahart, Pharm.D. Pharmacy Resident 02/10/2024 7:52 AM

## 2024-02-10 NOTE — Telephone Encounter (Signed)
 Advanced Heart Failure Patient Advocate Encounter  Test billing for this patients current coverage shows that both Farxiga and Jardiance would have copays of $40 for 30 days or $120 for 90 days.  Kennis Peacock, CPhT Rx Patient Advocate Phone: [phone]

## 2024-03-09 ENCOUNTER — Telehealth: Payer: Self-pay | Admitting: Internal Medicine

## 2024-03-09 NOTE — Telephone Encounter (Signed)
 Called to confirm/remind patient of their appointment at the [facility] on 03/10/24.   Appointment:   [] Confirmed  [x] Left mess   [] No answer/No voice mail  [] VM Full/unable to leave message  [] Phone not in service  Patient reminded to bring all medications and/or complete list.  Confirmed patient has transportation. Gave directions, instructed to utilize valet parking.

## 2024-03-10 ENCOUNTER — Encounter: Admitting: Internal Medicine

## 2024-03-10 ENCOUNTER — Ambulatory Visit: Attending: Internal Medicine | Admitting: Internal Medicine

## 2024-03-10 ENCOUNTER — Encounter: Payer: Self-pay | Admitting: Internal Medicine

## 2024-03-10 VITALS — BP 158/81 | HR 64 | Wt 124.4 lb

## 2024-03-10 DIAGNOSIS — D472 Monoclonal gammopathy: Secondary | ICD-10-CM

## 2024-03-10 DIAGNOSIS — M25471 Effusion, right ankle: Secondary | ICD-10-CM | POA: Diagnosis not present

## 2024-03-10 DIAGNOSIS — I2510 Atherosclerotic heart disease of native coronary artery without angina pectoris: Secondary | ICD-10-CM | POA: Insufficient documentation

## 2024-03-10 DIAGNOSIS — Z7982 Long term (current) use of aspirin: Secondary | ICD-10-CM | POA: Insufficient documentation

## 2024-03-10 DIAGNOSIS — I951 Orthostatic hypotension: Secondary | ICD-10-CM | POA: Diagnosis not present

## 2024-03-10 DIAGNOSIS — Z79899 Other long term (current) drug therapy: Secondary | ICD-10-CM | POA: Insufficient documentation

## 2024-03-10 DIAGNOSIS — I5022 Chronic systolic (congestive) heart failure: Secondary | ICD-10-CM

## 2024-03-10 DIAGNOSIS — I739 Peripheral vascular disease, unspecified: Secondary | ICD-10-CM | POA: Diagnosis not present

## 2024-03-10 DIAGNOSIS — I110 Hypertensive heart disease with heart failure: Secondary | ICD-10-CM | POA: Insufficient documentation

## 2024-03-10 DIAGNOSIS — Z7902 Long term (current) use of antithrombotics/antiplatelets: Secondary | ICD-10-CM | POA: Insufficient documentation

## 2024-03-10 DIAGNOSIS — J479 Bronchiectasis, uncomplicated: Secondary | ICD-10-CM | POA: Insufficient documentation

## 2024-03-10 DIAGNOSIS — I25810 Atherosclerosis of coronary artery bypass graft(s) without angina pectoris: Secondary | ICD-10-CM | POA: Diagnosis not present

## 2024-03-10 DIAGNOSIS — Z86718 Personal history of other venous thrombosis and embolism: Secondary | ICD-10-CM | POA: Insufficient documentation

## 2024-03-10 DIAGNOSIS — I5032 Chronic diastolic (congestive) heart failure: Secondary | ICD-10-CM | POA: Diagnosis present

## 2024-03-10 NOTE — Progress Notes (Signed)
 [facility] CLINIC NOTE  Referring Physician: Antonio Baumgarten, MD Primary Care: Antonio Baumgarten, MD Primary Cardiologist: Belva Boyden, MD  Chief Complaint: Heart failure  HPI:  Lindsey [name] is an 88 y.o. 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 Recently here bisoprolol  and losartan  had to be stopped due to orthostatic hypotension. A PYP scan was ordered. PYP on 11/11/23 was read as Grade II concerning for TTY cardiac amyloidosis. I have reviewed this study personally and think it is equivocal for the presence of cardiac amyloid.   Seen by [facility] and diagnosed with MGUS Labs show:  SPEP with a very small M-spike 0.2 with igG lambda light chain specificity UPEP no m-spike Free light changes normal  Echo 5/23 EF 40-45% (felt to be ischemic in nature) Echo 6/24 EF 55-60% Echo 11/24 EF 50-55% Mod LVH. No effusion. RV mildly HK   Admitted 3/1-12/2023 with worsening gait imbalance. MRI/MRA negative. 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 Abilify  held   Here for f/u. At last visit we ordered cMRI to look for infiltrative process. Study is study for 04/03/24. Doing ok, [place] and doing water aerobics. Gets dizzy if she stands up too quickly. Says BP drops 30 points when she stands up. Unable to tolerate compression hose. Mild swelling in R ankle. Denies SOB>     Past Medical History:  Diagnosis Date   Anemia    Atrial tachycardia (HCC)    Breast cancer (HCC)    Bronchiectasis (HCC) 06/2007   CAD (coronary artery disease)    a. 02/2022 Cath: LM nl, LAD 7m, D23 40, LCX 133m/d w/ L->L and R->L collats, RCA 71m-->Med Rx.   Cardiac amyloidosis (HCC)    a. 11/2023 PYP: +  radiotracer uptake suggestive of ATTR.   Chronic Dizziness    Colitis    COPD (chronic obstructive pulmonary disease) (HCC)    Depression    Diastolic dysfunction    a. 12/2019 Echo: EF 60-65%, no rwma, GrII DD. Nl RV size/fxn. Mild MR/AI.   DVT (deep venous thrombosis) (HCC)    after her right knee surgery.    Fibromyalgia    Heart failure with improved ejection fraction (HFimpEF) (HCC)    a. 02/2022 Echo: EF 40-45%, GrIII DD; b. 03/2023 Echo: EF 55-60%; c. 08/2023 Echo: EF 50-55%, mod LVH, GrI DD, low-nl RV fxn, ml PASP, mild MR/AI, mild-mod TR, AoV sclerosis.   Ischemic cardiomyopathy    a. 02/2022 Echo: EF 40-45%; b. 03/2023 Echo: EF 55-60%; c. 08/2023 Echo: EF 50-55%.   Labile Hypertension    Migraine headache with aura    Osteoarthritis    Osteoporosis    PAD (peripheral artery disease) (HCC)    Pneumonia    Polio 1952   Stenosis of carotid artery     Current Outpatient Medications  Medication Sig Dispense Refill   acetaminophen  (TYLENOL ) 500 MG tablet Take 500 mg by mouth at bedtime as needed for moderate pain.     albuterol  (VENTOLIN  HFA) 108 (90 Base) MCG/ACT inhaler Inhale 2 puffs into the lungs every 6 (six) hours as needed for wheezing or shortness of breath.     aspirin  EC 81 MG tablet Take 1 tablet (81 mg total) by mouth daily.  Swallow whole.     budesonide  (ENTOCORT EC ) 3 MG 24 hr capsule Take 3 mg by mouth. Taper dose     clopidogrel  (PLAVIX ) 75 MG tablet TAKE ONE TABLET EACH MORNING WITH BREAKFAST 90 tablet 3   cyanocobalamin  (VITAMIN B12) 1000 MCG tablet Take 1 tablet (1,000 mcg total) by mouth daily.     cyclobenzaprine  (FLEXERIL ) 5 MG tablet Take 5 mg by mouth at bedtime as needed.     ezetimibe  (ZETIA ) 10 MG tablet TAKE 1 TABLET BY MOUTH DAILY. 90 tablet 0   furosemide  (LASIX ) 20 MG tablet Take 1 tablet (20 mg total) by mouth daily as needed (daily weight gain of 2 or more pounds). 45 tablet 3   lisinopril  (ZESTRIL ) 2.5 MG tablet Take 1 tablet (2.5 mg total) by  mouth daily. 90 tablet 3   nitroGLYCERIN  (NITROLINGUAL ) 0.4 MG/SPRAY spray Place 1 spray under the tongue every 5 (five) minutes x 3 doses as needed for chest pain. 12 g 12   polyethylene glycol (MIRALAX  / GLYCOLAX ) packet Take 17 g by mouth daily as needed for mild constipation or moderate constipation.     potassium chloride  (KLOR-CON  M) 10 MEQ tablet TAKE 1 TABLET BY MOUTH DAILY 90 tablet 3   predniSONE  (DELTASONE ) 50 MG tablet Take one tablet 13 hours, 7 hours, and 1 hour prior to scan. 3 tablet 0   venlafaxine  XR (EFFEXOR -XR) 150 MG 24 hr capsule Take 150 mg by mouth daily.     No current facility-administered medications for this visit.    Allergies  Allergen Reactions   Ivp Dye [Iodinated Contrast Media] Anaphylaxis   Charentais Melon (French Melon) Other (See Comments)    All melons cause stomach pain and upset   Codeine Other (See Comments)    Dizziness  Dizziness   Cucumber Extract    Demeclocycline Other (See Comments)    Stomach upset    Galcanezumab -Gnlm Itching   Repatha  [Evolocumab ] Dermatitis    LUMP AT AREA OF SHOT AND ITCHING   Sulfa Antibiotics Hives   Tegaderm Ag Mesh [Silver]    Telmisartan      dizziness Other reaction(s): Dizziness dizziness   Tetracyclines & Related     Stomach upset    Wild Lettuce Extract (Lactuca Virosa) Other (See Comments)    Stomach upset and pain      Social History   Socioeconomic History   Marital status: Widowed    Spouse name: Not on file   Number of children: Not on file   Years of education: Not on file   Highest education level: Not on file  Occupational History   Not on file  Tobacco Use   Smoking status: Never   Smokeless tobacco: Never  Vaping Use   Vaping status: Never Used  Substance and Sexual Activity   Alcohol  use: Yes    Alcohol /week: 4.0 standard drinks of alcohol     Types: 4 Glasses of wine per week    Comment: a glass of wine every other day   Drug use: No   Sexual activity: Not on file  Other  Topics Concern   Not on file  Social History Narrative   Not on file   Social Drivers of Health   Financial Resource Strain: Low Risk  (02/08/2024)   Received from [facility]   Overall Financial Resource Strain (CARDIA)    Difficulty of Paying Living Expenses: Not hard at all  Food Insecurity: No Food Insecurity (02/08/2024)   Received from [facility]   Hunger Vital Sign    Worried About Running Out of Food in the Last Year: Never true    Ran Out of Food in the Last Year: Never true  Transportation Needs: No Transportation Needs (02/08/2024)   Received from [facility]    In the past 12 months, has lack of transportation kept you from medical appointments or from getting medications?: No    Lack of Transportation (Non-Medical): No  Physical Activity: Not on file  Stress: Not on file  Social Connections: Moderately Integrated (12/04/2023)   Social Connection and Isolation Panel [NHANES]    Frequency of Communication with Friends and Family: More than three times a week    Frequency of Social Gatherings with Friends and Family: Three times a week    Attends Religious Services: 1 to 4 times per year    Active Member of Clubs or Organizations: Yes    Attends Banker Meetings: 1 to 4 times per year    Marital Status: Widowed  Intimate Partner Violence: Not At Risk (12/04/2023)   Humiliation, Afraid, Rape, and Kick questionnaire    Fear of Current or Ex-Partner: No    Emotionally Abused: No    Physically Abused: No    Sexually Abused: No      Family History  Problem Relation Age of Onset   Pancreatic cancer Mother    Arthritis Mother    Ovarian cancer Mother    Arthritis Father    Bladder Cancer Father    Atrial fibrillation Sister    Endometrial cancer Sister    Colon cancer Sister     Vitals:   03/10/24 1145  BP: (!) 158/81  Pulse: 64  SpO2: 100%  Weight: 124 lb 6.4 oz (56.4 kg)     PHYSICAL EXAM: General:  Well appearing. No resp difficulty HEENT: normal Neck: supple. no JVD. Carotids 2+ bilat; no bruits. No lymphadenopathy or thryomegaly appreciated. Cor: PMI nondisplaced. Regular rate & rhythm. No rubs, gallops or murmurs. Lungs: clear Abdomen: soft, nontender, nondistended. No hepatosplenomegaly. No bruits or masses. Good bowel sounds. Extremities: no cyanosis, clubbing, rash, mild edema in R ankle (previous injury) Neuro: alert & orientedx3, cranial nerves grossly intact. moves all 4 extremities w/o difficulty. Affect pleasant   ASSESSMENT & PLAN:  1. Chronic heart failure with improved ejection fraction/abnormal PYP scan: - Echo 5/23 EF 40-45% (felt to be ischemic in nature) - Echo 6/24 EF 55-60% - Echo 11/24 EF 50-55% Mod LVH. No effusion. RV mildly HK  - PYP 11/11/23 read as Grade II concerning for TTY cardiac amyloidosis. I have reviewed this study personally and think it is equivocal for the presence of cardiac amyloid.  - SPEP with a very small M-spike 0.2 with igG lambda light chain specificity - UPEP no m-spike - Free light changes normal - TTR genetics negative - Despite mild M-spike and orthostasis, I am not convinced that she has cardiac amyloidosis. Testing is negative for AL amyloidosis and PYP is equivocal at worst - Pending cMRI on 04/03/24 to further evalaute - NYHA I Volume ok  - GDMT limited by orthostasis. Continue low-dose lisinopril  -  2.  Orthostatic Hypotension/Primary Hypertension:  - suspect this is primarily related to age and potential Parkinson's disease - suspicion for clinically-significant amyloidosis is low -> await cMRI - refuses compression hose. - can start midodrine as needed - suggest compression hose, avoid overdiuresis and add midodrine as needed  3.   Coronary artery disease:  - Known occlusion of the left circumflex by catheterization May 2023 with the left and right to left collaterals and otherwise  nonobstructive disease. - no s/s angina - continue aspirin , Plavix , Zetia , and Praluent .   Lindsey Oar, MD  12:10 PM

## 2024-03-10 NOTE — Patient Instructions (Signed)
  Follow-Up in: Please follow up with the [facility] in 2 months.   At the [facility], you and your health needs are our priority. We have a designated team specialized in the treatment of Heart Failure. This Care Team includes your primary Heart Failure Specialized Cardiologist (physician), Advanced Practice Providers (APPs- Physician Assistants and Nurse Practitioners), and Pharmacist who all work together to provide you with the care you need, when you need it.   You may see any of the following providers on your designated Care Team at your next follow up:  Dr. Jules Oar Dr. Peder Bourdon Dr. Alwin Baars Dr. Judyth Nunnery Shawnee Dellen, FNP Bevely Brush, RPH-CPP  Please be sure to bring in all your medications bottles to every appointment.   Need to Contact Us :  If you have any questions or concerns before your next appointment please send us  a message through [facility] or call our office at [phone].    TO LEAVE A MESSAGE FOR THE NURSE SELECT OPTION 2, PLEASE LEAVE A MESSAGE INCLUDING: YOUR NAME DATE OF BIRTH CALL BACK NUMBER REASON FOR CALL**this is important as we prioritize the call backs  YOU WILL RECEIVE A CALL BACK THE SAME DAY AS LONG AS YOU CALL BEFORE 4:00 PM

## 2024-03-24 ENCOUNTER — Inpatient Hospital Stay: Payer: Medicare Other | Attending: Oncology

## 2024-03-24 DIAGNOSIS — D72810 Lymphocytopenia: Secondary | ICD-10-CM | POA: Insufficient documentation

## 2024-03-24 DIAGNOSIS — E538 Deficiency of other specified B group vitamins: Secondary | ICD-10-CM | POA: Insufficient documentation

## 2024-03-24 DIAGNOSIS — D472 Monoclonal gammopathy: Secondary | ICD-10-CM | POA: Insufficient documentation

## 2024-03-24 LAB — CBC WITH DIFFERENTIAL (CANCER CENTER ONLY)
Abs Immature Granulocytes: 0.02 10*3/uL (ref 0.00–0.07)
Basophils Absolute: 0 10*3/uL (ref 0.0–0.1)
Basophils Relative: 1 %
Eosinophils Absolute: 0.2 10*3/uL (ref 0.0–0.5)
Eosinophils Relative: 4 %
HCT: 40.9 % (ref 36.0–46.0)
Hemoglobin: 13.3 g/dL (ref 12.0–15.0)
Immature Granulocytes: 0 %
Lymphocytes Relative: 20 %
Lymphs Abs: 1.3 10*3/uL (ref 0.7–4.0)
MCH: 29.2 pg (ref 26.0–34.0)
MCHC: 32.5 g/dL (ref 30.0–36.0)
MCV: 89.9 fL (ref 80.0–100.0)
Monocytes Absolute: 0.6 10*3/uL (ref 0.1–1.0)
Monocytes Relative: 9 %
Neutro Abs: 4.4 10*3/uL (ref 1.7–7.7)
Neutrophils Relative %: 66 %
Platelet Count: 237 10*3/uL (ref 150–400)
RBC: 4.55 MIL/uL (ref 3.87–5.11)
RDW: 15.6 % — ABNORMAL HIGH (ref 11.5–15.5)
WBC Count: 6.6 10*3/uL (ref 4.0–10.5)
nRBC: 0 % (ref 0.0–0.2)

## 2024-03-24 LAB — CMP (CANCER CENTER ONLY)
ALT: 22 U/L (ref 0–44)
AST: 42 U/L — ABNORMAL HIGH (ref 15–41)
Albumin: 3.6 g/dL (ref 3.5–5.0)
Alkaline Phosphatase: 69 U/L (ref 38–126)
Anion gap: 7 (ref 5–15)
BUN: 25 mg/dL — ABNORMAL HIGH (ref 8–23)
CO2: 28 mmol/L (ref 22–32)
Calcium: 8.8 mg/dL — ABNORMAL LOW (ref 8.9–10.3)
Chloride: 101 mmol/L (ref 98–111)
Creatinine: 0.57 mg/dL (ref 0.44–1.00)
GFR, Estimated: >60 mL/min (ref >60)
Glucose, Bld: 95 mg/dL (ref 70–99)
Potassium: 3.7 mmol/L (ref 3.5–5.1)
Sodium: 136 mmol/L (ref 135–145)
Total Bilirubin: 0.8 mg/dL (ref 0.0–1.2)
Total Protein: 6.2 g/dL — ABNORMAL LOW (ref 6.5–8.1)

## 2024-03-24 LAB — VITAMIN B12: Vitamin B-12: 682 pg/mL (ref 180–914)

## 2024-03-27 LAB — MULTIPLE MYELOMA PANEL, SERUM
Albumin SerPl Elph-Mcnc: 3.5 g/dL (ref 2.9–4.4)
Albumin/Glob SerPl: 1.6 (ref 0.7–1.7)
Alpha 1: 0.2 g/dL (ref 0.0–0.4)
Alpha2 Glob SerPl Elph-Mcnc: 0.6 g/dL (ref 0.4–1.0)
B-Globulin SerPl Elph-Mcnc: 0.9 g/dL (ref 0.7–1.3)
Gamma Glob SerPl Elph-Mcnc: 0.6 g/dL (ref 0.4–1.8)
Globulin, Total: 2.3 g/dL (ref 2.2–3.9)
IgA: 132 mg/dL (ref 64–422)
IgG (Immunoglobin G), Serum: 505 mg/dL — ABNORMAL LOW (ref 586–1602)
IgM (Immunoglobulin M), Srm: 35 mg/dL (ref 26–217)
M Protein SerPl Elph-Mcnc: 0.3 g/dL — ABNORMAL HIGH
Total Protein ELP: 5.8 g/dL — ABNORMAL LOW (ref 6.0–8.5)

## 2024-03-27 LAB — KAPPA/LAMBDA LIGHT CHAINS
Kappa free light chain: 14.8 mg/L (ref 3.3–19.4)
Kappa, lambda light chain ratio: 0.77 (ref 0.26–1.65)
Lambda free light chains: 19.3 mg/L (ref 5.7–26.3)

## 2024-03-30 ENCOUNTER — Encounter (HOSPITAL_COMMUNITY): Payer: Self-pay

## 2024-04-03 ENCOUNTER — Other Ambulatory Visit: Payer: Self-pay | Admitting: Family

## 2024-04-03 ENCOUNTER — Inpatient Hospital Stay: Payer: Medicare Other | Admitting: Oncology

## 2024-04-03 ENCOUNTER — Ambulatory Visit
Admission: RE | Admit: 2024-04-03 | Discharge: 2024-04-03 | Disposition: A | Discharge status: Home / Self Care | Source: Ambulatory Visit | Attending: Family | Admitting: Family

## 2024-04-03 DIAGNOSIS — I5032 Chronic diastolic (congestive) heart failure: Secondary | ICD-10-CM | POA: Diagnosis present

## 2024-04-03 MED ORDER — GADOBUTROL 1 MMOL/ML IV SOLN
9.0000 mL | Freq: Once | INTRAVENOUS | Status: AC | PRN
  Administered 2024-04-03: 8 mL via INTRAVENOUS

## 2024-04-03 NOTE — Assessment & Plan Note (Deleted)
 IgG lamda MGUS   Lab Results  Component Value Date   MPROTEIN 0.3 (H) 03/24/2024   KPAFRELGTCHN 14.8 03/24/2024   LAMBDASER 19.3 03/24/2024   KAPLAMBRATIO 0.77 03/24/2024    No anemia, hypercalcemia, kidney function impairment. Continue observation.  Pro- BNP is elevated ,she has history of CHF  I plan to discuss with her cardiologist to see if she needs to get cardiac MRI done to rule out amyloidosis.

## 2024-04-04 ENCOUNTER — Ambulatory Visit: Payer: Self-pay | Admitting: Family

## 2024-04-10 ENCOUNTER — Telehealth: Payer: Self-pay | Admitting: Cardiovascular Disease

## 2024-04-10 NOTE — Progress Notes (Unsigned)
 Cardiology Office Note  Date:  04/11/2024   ID:  Yasha  NATASIA SANKO, DOB 1936-02-14, MRN [id number]  PCP:  Sadie Manna, MD   Chief Complaint  Patient presents with   Near Syncope    Patient c/o being concerned about being on Plavix ; she will bump herself and start to bleed and since Sunday, 04/09/2024 has felt faint.     HPI:  Ms Nader is a pleasant 88 year old woman with history of  CAD,  occlusion of mid LCx  5/23 lower extremity swelling,Secondary to venous insufficiency  prior right knee surgery,  right DVT, status post IVC filter,  bronchiectasis on inhalers and nebulizers at home  Back surgery Echo 5/23: EF 40 to 45% who presents for follow up of her labile blood pressure and her leg swelling.  Last seen in clinic by myself 6/24 Seen by advanced heart failure March 10, 2024   bisoprolol  and losartan  had to be stopped due to orthostatic hypotension. A PYP scan was ordered. PYP on 11/11/23 was read as Grade II concerning for TTY cardiac amyloidosis, (equivocal for the presence of cardiac amyloid)   Seen by [facility] and diagnosed with MGUS Labs show:   SPEP with a very small M-spike 0.2 with igG lambda light chain specificity UPEP no m-spike Free light changes normal   Echo 5/23 EF 40-45% (felt to be ischemic in nature) Echo 6/24 EF 55-60% Echo 11/24 EF 50-55% Mod LVH. No effusion. RV mildly HK    Admitted 3/1-12/2023 with worsening gait imbalance. MRI/MRA negative. 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 Abilify  held    cMRI to look for infiltrative process. Study is for 04/03/24.  Normal LV size, low normal LV systolic function.  LVEF 50%. No LGE or scar. Normal ECV with no evidence for infiltrative disease/ amyloidosis.  On today's visit reports she is having orthostasis symptoms Takes lisinopril  5 mg with amlodipine  5 mg in the morning On today's visit difficulty standing up secondary to dizziness  Blood pressure checked with  numbers as below Supine 150/83 pulse 70 Sitting 136/88 pulse 72 Standing 128/81 pulse 76 Standing 3 minutes 143/89 pulse 79  Continues to live at [employer], Active, good friends, enjoys living there  Denies significant chest pain or shortness of breath, no significant leg swelling Takes Lasix  sparingly  EKG personally reviewed by myself on todays visit EKG Interpretation Date/Time:  Tuesday April 11 2024 11:31:29 EDT Ventricular Rate:  65 PR Interval:  192 QRS Duration:  154 QT Interval:  440 QTC Calculation: 457 R Axis:   -72  Text Interpretation: Sinus rhythm with marked sinus arrhythmia Right bundle branch block Left anterior fascicular block Bifascicular block Minimal voltage criteria for LVH, may be normal variant ( R in aVL ) Septal infarct , age undetermined When compared with ECG of 15-Jan-2024 15:22, No significant change was found Confirmed by [name] Lye [phone]) on 04/11/2024 11:44:30 AM    Other past medical history reviewed Cardiac cath 03/03/22 Severe single vessel coronary artery disease with occlusion of mid LCx with left-to-left and right-to-left collaterals supplying distal OM branches.  Given patient's history of chest pain ~3 weeks ago following by progressive heart failure symptoms, I suspect her initial coronary event occurred at that time. Mild-moderate, non-obstructive CAD involving the LAD and RCA. Mildly-moderately elevated left heart filling pressures. Moderate pulmonary hypertension. Normal Fick cardiac output/index.    [facility] ED on 10/15/21 for evaluation of dizziness.  weakness and disorientation, her legs felt very heavy, and  she was not sure if she could take the next step.  There was no evidence of arrhythmia or ischemia on cardiac monitor with the exception of frequent PVCs. Orthostatic VS were unremarkable. Hs troponins were within normal range  Echocardiogram November 24, 2021 Reviewed on today's visit Normal LV function Normal RV  function No significant valve disease   PMH:   has a past medical history of Anemia, Atrial tachycardia (HCC), Breast cancer (HCC), Bronchiectasis (HCC) (06/2007), CAD (coronary artery disease), Cardiac amyloidosis (HCC), Chronic Dizziness, Colitis, COPD (chronic obstructive pulmonary disease) (HCC), Depression, Diastolic dysfunction, DVT (deep venous thrombosis) (HCC), Fibromyalgia, Heart failure with improved ejection fraction (HFimpEF) (HCC), Ischemic cardiomyopathy, Labile Hypertension, Migraine headache with aura, Osteoarthritis, Osteoporosis, PAD (peripheral artery disease) (HCC), Pneumonia, Polio (1952), and Stenosis of carotid artery.  PSH:    Past Surgical History:  Procedure Laterality Date   ABDOMINAL HYSTERECTOMY     BACK SURGERY     BILATERAL TOTAL MASTECTOMY WITH AXILLARY LYMPH NODE DISSECTION     BREAST SURGERY     CARPAL TUNNEL RELEASE     bilateral    COLONOSCOPY WITH PROPOFOL  N/A 08/02/2015   Procedure: COLONOSCOPY WITH PROPOFOL ;  Surgeon: Lamar ONEIDA Holmes, MD;  Location: [facility] ENDOSCOPY;  Service: Endoscopy;  Laterality: N/A;   FINGER TENDON REPAIR     HERNIA REPAIR     REPLACEMENT TOTAL KNEE     right knee    RIGHT/LEFT HEART CATH AND CORONARY ANGIOGRAPHY N/A 03/03/2022   Procedure: RIGHT/LEFT HEART CATH AND CORONARY ANGIOGRAPHY;  Surgeon: Mady Bruckner, MD;  Location: [facility] INVASIVE CV LAB;  Service: Cardiovascular;  Laterality: N/A;   SQUAMOUS CELL CARCINOMA EXCISION     TONSILLECTOMY     TOTAL KNEE ARTHROPLASTY Left 09/19/2020   Procedure: TOTAL KNEE ARTHROPLASTY;  Surgeon: Kathlynn Sharper, MD;  Location: [facility] ORS;  Service: Orthopedics;  Laterality: Left;   UMBILICAL HERNIA REPAIR     VAGINAL HYSTERECTOMY      Current Outpatient Medications on File Prior to Visit  Medication Sig Dispense Refill   acetaminophen  (TYLENOL ) 500 MG tablet Take 500 mg by mouth at bedtime as needed for moderate pain.     albuterol  (VENTOLIN  HFA) 108 (90 Base) MCG/ACT inhaler Inhale 2  puffs into the lungs every 6 (six) hours as needed for wheezing or shortness of breath.     amLODipine  (NORVASC ) 5 MG tablet Take 5 mg by mouth daily.     aspirin  EC 81 MG tablet Take 1 tablet (81 mg total) by mouth daily. Swallow whole.     clopidogrel  (PLAVIX ) 75 MG tablet TAKE ONE TABLET EACH MORNING WITH BREAKFAST 90 tablet 3   cyanocobalamin  (VITAMIN B12) 1000 MCG tablet Take 1 tablet (1,000 mcg total) by mouth daily.     cyclobenzaprine  (FLEXERIL ) 5 MG tablet Take 5 mg by mouth at bedtime as needed.     ezetimibe  (ZETIA ) 10 MG tablet TAKE 1 TABLET BY MOUTH DAILY. 90 tablet 0   furosemide  (LASIX ) 20 MG tablet Take 1 tablet (20 mg total) by mouth daily as needed (daily weight gain of 2 or more pounds). 45 tablet 3   hydrALAZINE  (APRESOLINE ) 25 MG tablet Qd prn for elevated BP > 160 systolic     hydrOXYzine  (ATARAX ) 25 MG tablet Take 25 mg by mouth every 4 (four) hours as needed.     lisinopril  (ZESTRIL ) 2.5 MG tablet Take 1 tablet (2.5 mg total) by mouth daily. 90 tablet 3   nitroGLYCERIN  (NITROLINGUAL ) 0.4 MG/SPRAY spray Place 1  spray under the tongue every 5 (five) minutes x 3 doses as needed for chest pain. 12 g 12   omega-3 acid ethyl esters (LOVAZA ) 1 g capsule Take 2 g by mouth 2 (two) times daily.     polyethylene glycol (MIRALAX  / GLYCOLAX ) packet Take 17 g by mouth daily as needed for mild constipation or moderate constipation.     potassium chloride  (KLOR-CON  M) 10 MEQ tablet TAKE 1 TABLET BY MOUTH DAILY 90 tablet 3   predniSONE  (DELTASONE ) 50 MG tablet Take one tablet 13 hours, 7 hours, and 1 hour prior to scan. 3 tablet 0   venlafaxine  XR (EFFEXOR -XR) 150 MG 24 hr capsule Take 150 mg by mouth daily.     budesonide  (ENTOCORT EC ) 3 MG 24 hr capsule Take 3 mg by mouth. Taper dose (Patient not taking: Reported on 04/11/2024)     No current facility-administered medications on file prior to visit.    Allergies:   Ivp dye [iodinated contrast media], Charentais melon (french melon),  Codeine, Cucumber extract, Demeclocycline, Galcanezumab -gnlm, Repatha  [evolocumab ], Sulfa antibiotics, Tegaderm ag mesh [silver], Telmisartan , Tetracyclines & related, and Wild lettuce extract (lactuca virosa)   Social History:  The patient  reports that she has never smoked. She has never used smokeless tobacco. She reports current alcohol  use of about 4.0 standard drinks of alcohol  per week. She reports that she does not use drugs.   Family History:   family history includes Arthritis in her father and mother; Atrial fibrillation in her sister; Bladder Cancer in her father; Colon cancer in her sister; Endometrial cancer in her sister; Ovarian cancer in her mother; Pancreatic cancer in her mother.    Review of Systems: Review of Systems  Constitutional: Negative.   HENT: Negative.    Respiratory:  Positive for shortness of breath.   Cardiovascular: Negative.   Gastrointestinal: Negative.   Neurological: Negative.   Psychiatric/Behavioral: Negative.    All other systems reviewed and are negative.  PHYSICAL EXAM: VS:  BP 138/70 (BP Location: Left Arm, Patient Position: Sitting, Cuff Size: Normal)   Pulse 65   Ht 5' 2 (1.575 m)   Wt 122 lb 6 oz (55.5 kg)   SpO2 94%   BMI 22.38 kg/m  , BMI Body mass index is 22.38 kg/m.  Constitutional:  oriented to person, place, and time. No distress.  HENT:  Head: Grossly normal Eyes:  no discharge. No scleral icterus.  Neck: No JVD, no carotid bruits  Cardiovascular: Regular rate and rhythm, no murmurs appreciated Pulmonary/Chest: Clear to auscultation bilaterally, no wheezes or rails Abdominal: Soft.  no distension.  no tenderness.  Musculoskeletal: Normal range of motion Neurological:  normal muscle tone. Coordination normal. No atrophy Skin: Skin warm and dry Psychiatric: normal affect, pleasant  Recent Labs: 01/15/2024: B Natriuretic Peptide 88.9 03/24/2024: ALT 22; BUN 25; Creatinine 0.57; Hemoglobin 13.3; Platelet Count 237; Potassium  3.7; Sodium 136   Lipid Panel Lab Results  Component Value Date   CHOL 144 12/04/2023   HDL 102 12/04/2023   LDLCALC 33 12/04/2023   TRIG 47 12/04/2023    Wt Readings from Last 3 Encounters:  04/11/24 122 lb 6 oz (55.5 kg)  03/10/24 124 lb 6.4 oz (56.4 kg)  02/10/24 125 lb 6.4 oz (56.9 kg)     ASSESSMENT AND PLAN:  Coronary artery disease with stable angina Catheterization May 2023, occluded circumflex with collaterals On Zetia   Repatha  appears off her list, reports having a localized reaction as well as Praluent  continue  aspirin  81, Plavix  Ejection fraction normalized - Cardiac MRI with no infiltrate concerning for amyloid  Labile blood pressure Blood pressure not low today but she reports having orthostasis type symptoms Prior history of dizziness on select blood pressure medication - Recommend she move amlodipine  to the evening continue lisinopril  in the morning  History of DVT (deep vein thrombosis) No recurrent episodes, stable  Chronic respiratory distress /bronchiectasis without complication (HCC) chronic shortness of breath,  Previously on oxygen, not on oxygen today Followed by pulmonary, on inhalers  Leg swelling taking Lasix  20 mg as needed Stable renal function,  Denies edema, PND orthopnea  PAD/carotid disease Continue Zetia , Reports having a localized reaction to both Repatha  and Praluent  Consider Leqvio  Atrial tachycardia Previously on bisoprolol  Denies tachycardia  Zio monitor previously completed Denies symptoms of tachycardia or palpitations  Statin myalgias Will wean off statin, Lipitor   Orders Placed This Encounter  Procedures   EKG 12-Lead      Signed, Velinda Lunger, M.D., Ph.D. 04/11/2024  [facility] [place], [place] [phone]

## 2024-04-10 NOTE — Telephone Encounter (Signed)
 Patient called reporting that yesterday she experienced an episode of severe dizziness and near syncope whenever she stood up, requiring her to sit back down and rest her head on a table. She also reported shortness of breath and feeling like she was gasping for air. The symptoms lasted approximately one hour. She was transported back to her room via wheelchair. She reported the nurses did not check her blood pressure during the episode, but a few hours later it was recorded at 148/89.  Patient also reported bilateral leg swelling that has since resolved with elevation.  Today, the patient reports feeling very fatigued.  Appointment scheduled for tomorrow, 04/11/2024, for further evaluation. Patient was informed of emergency department precautions should any new symptoms develop or existing symptoms worsen.

## 2024-04-10 NOTE — Telephone Encounter (Signed)
 STAT if patient feels like he/she is going to faint   1. Are you feeling dizzy, lightheaded, or faint right now? Dizzy     2. Have you passed out?  no (If yes move to [facility])   3. Do you have any other symptoms? Swelling in legs   4. Have you checked your HR and BP (record if available)? 123/81 79, 136/81  Pt had to be put in a wheelchair last night to get to her apartment because she was so dizzy she could not walk.

## 2024-04-11 ENCOUNTER — Telehealth: Payer: Self-pay | Admitting: Emergency Medicine

## 2024-04-11 ENCOUNTER — Ambulatory Visit: Attending: Cardiovascular Disease | Admitting: Cardiovascular Disease

## 2024-04-11 VITALS — BP 138/70 | HR 65 | Ht 62.0 in | Wt 122.4 lb

## 2024-04-11 DIAGNOSIS — I43 Cardiomyopathy in diseases classified elsewhere: Secondary | ICD-10-CM

## 2024-04-11 DIAGNOSIS — I5022 Chronic systolic (congestive) heart failure: Secondary | ICD-10-CM

## 2024-04-11 DIAGNOSIS — I25810 Atherosclerosis of coronary artery bypass graft(s) without angina pectoris: Secondary | ICD-10-CM | POA: Diagnosis not present

## 2024-04-11 DIAGNOSIS — E785 Hyperlipidemia, unspecified: Secondary | ICD-10-CM

## 2024-04-11 DIAGNOSIS — I10 Essential (primary) hypertension: Secondary | ICD-10-CM

## 2024-04-11 DIAGNOSIS — E782 Mixed hyperlipidemia: Secondary | ICD-10-CM

## 2024-04-11 DIAGNOSIS — I5032 Chronic diastolic (congestive) heart failure: Secondary | ICD-10-CM

## 2024-04-11 DIAGNOSIS — R002 Palpitations: Secondary | ICD-10-CM

## 2024-04-11 DIAGNOSIS — D472 Monoclonal gammopathy: Secondary | ICD-10-CM

## 2024-04-11 DIAGNOSIS — E854 Organ-limited amyloidosis: Secondary | ICD-10-CM

## 2024-04-11 DIAGNOSIS — I951 Orthostatic hypotension: Secondary | ICD-10-CM | POA: Diagnosis not present

## 2024-04-11 DIAGNOSIS — Z79899 Other long term (current) drug therapy: Secondary | ICD-10-CM

## 2024-04-11 NOTE — Patient Instructions (Addendum)
 Medication Instructions:   Please move the amlodipine  to the evening  Monitor blood pressures and at home.   If dizziness continues, call the office, we may need to decrease the amlodipine  down to 2.5 mg daily  If you need a refill on your cardiac medications before your next appointment, please call your pharmacy.   Lab work: No new labs needed  Testing/Procedures: No new testing needed  Follow-Up: At [facility], you and your health needs are our priority.  As part of our continuing mission to provide you with exceptional heart care, we have created designated Provider Care Teams.  These Care Teams include your primary Cardiologist (physician) and Advanced Practice Providers (APPs -  Physician Assistants and Nurse Practitioners) who all work together to provide you with the care you need, when you need it.  You will need a follow up appointment in 6 months  Providers on your designated Care Team:   Lonni Meager, NP Bernardino Bring, PA-C Cadence [place], [place]  COVID-19 Vaccine Information can be found at: [email or web address] For questions related to vaccine distribution or appointments, please email [email or web address] or call [phone].

## 2024-04-11 NOTE — Telephone Encounter (Signed)
 Called patient and left message for call back.  Patient has had allergic reactions to Repatha  and Praluent . Dr. Gollan would like to place a pharmacy consult for Leqvio.

## 2024-04-14 NOTE — Addendum Note (Signed)
 Addended by: CRISTOPHER OLIVIA PARAS on: 04/14/2024 01:27 PM   Modules accepted: Orders

## 2024-04-14 NOTE — Telephone Encounter (Signed)
 Called and spoke with patient. Notified patient that Dr. Gollan is recommending Leqvio. Patient verbalizes understanding. Order placed for pharmacy referral.

## 2024-04-17 ENCOUNTER — Telehealth: Payer: Self-pay | Admitting: Cardiovascular Disease

## 2024-04-17 MED ORDER — LISINOPRIL 10 MG PO TABS: 10.0000 mg | ORAL_TABLET | Freq: Every day | ORAL | 3 refills | Status: DC

## 2024-04-17 NOTE — Telephone Encounter (Signed)
 Called patient and notified her of the following from Dr. Gollan.  She could hold the amlodipine  Increase lisinopril  up to 10 mg twice a day Continue to monitor blood pressure Stay hydrated Thx tGollan    Patient verbalized understanding. Prescriptions sent to preferred pharmacy.

## 2024-04-17 NOTE — Addendum Note (Signed)
 Addended by: CRISTOPHER OLIVIA PARAS on: 04/17/2024 03:05 PM   Modules accepted: Orders

## 2024-04-17 NOTE — Telephone Encounter (Signed)
 Returned pt's phone call and spoke with pt; Pt states that she is to leave on Thursday, 7/17 to [facility] [place], but has been experiencing near-syncopal episodes when going from sitting extended periods of time to standing (ie:  getting up from a meal, she has to grab hold of table and lean over until the feeling of passing out goes away); pt states that she gets up very slow from sitting to standing; she feels it is her Norvasc    She takes Norvasc  in the morning and Lisinopril  in the evening.  Pt states she feels that the Norvasc  is causing her to feel this way; pt also states that she has had to take her PRN Lasix  more frequently due to swelling in her feet; pt denies any other symptoms other than feeling like she is going to pass out.  However, she states that her BP has been running high, especially her diastolic BP has been as high as 99 and her systolic BP has not been greater than 165, but her blood pressure fluctuates.  States that she will have an exact reading when we call back, but she currently doesn't have access to her computer at this time.  I reassured pt that I would send Dr. [name] her concerns and we would call her back with his recommendations.  Pt verbalized understanding.

## 2024-04-17 NOTE — Telephone Encounter (Signed)
 Patient called in to say that if she stands too long she feels as though she is going to passed out. Calling to see what she needs to do. Please advise

## 2024-05-06 ENCOUNTER — Emergency Department

## 2024-05-06 ENCOUNTER — Other Ambulatory Visit: Payer: Self-pay

## 2024-05-06 ENCOUNTER — Emergency Department
Admission: EM | Admit: 2024-05-06 | Discharge: 2024-05-06 | Disposition: A | Discharge status: Home / Self Care | Attending: Emergency Medicine | Admitting: Emergency Medicine

## 2024-05-06 DIAGNOSIS — S0003XA Contusion of scalp, initial encounter: Secondary | ICD-10-CM | POA: Insufficient documentation

## 2024-05-06 DIAGNOSIS — Z7902 Long term (current) use of antithrombotics/antiplatelets: Secondary | ICD-10-CM | POA: Diagnosis not present

## 2024-05-06 DIAGNOSIS — S0990XA Unspecified injury of head, initial encounter: Secondary | ICD-10-CM | POA: Diagnosis present

## 2024-05-06 DIAGNOSIS — M542 Cervicalgia: Secondary | ICD-10-CM | POA: Diagnosis not present

## 2024-05-06 DIAGNOSIS — M545 Low back pain, unspecified: Secondary | ICD-10-CM | POA: Diagnosis not present

## 2024-05-06 DIAGNOSIS — R42 Dizziness and giddiness: Secondary | ICD-10-CM

## 2024-05-06 DIAGNOSIS — Y92000 Kitchen of unspecified non-institutional (private) residence as  the place of occurrence of the external cause: Secondary | ICD-10-CM | POA: Insufficient documentation

## 2024-05-06 DIAGNOSIS — W19XXXA Unspecified fall, initial encounter: Secondary | ICD-10-CM | POA: Insufficient documentation

## 2024-05-06 DIAGNOSIS — I5030 Unspecified diastolic (congestive) heart failure: Secondary | ICD-10-CM | POA: Diagnosis not present

## 2024-05-06 DIAGNOSIS — I2510 Atherosclerotic heart disease of native coronary artery without angina pectoris: Secondary | ICD-10-CM | POA: Diagnosis not present

## 2024-05-06 DIAGNOSIS — J449 Chronic obstructive pulmonary disease, unspecified: Secondary | ICD-10-CM | POA: Insufficient documentation

## 2024-05-06 LAB — COMPREHENSIVE METABOLIC PANEL WITH GFR
ALT: 23 U/L (ref 0–44)
AST: 41 U/L (ref 15–41)
Albumin: 3.8 g/dL (ref 3.5–5.0)
Alkaline Phosphatase: 80 U/L (ref 38–126)
Anion gap: 10 (ref 5–15)
BUN: 27 mg/dL — ABNORMAL HIGH (ref 8–23)
CO2: 26 mmol/L (ref 22–32)
Calcium: 9 mg/dL (ref 8.9–10.3)
Chloride: 103 mmol/L (ref 98–111)
Creatinine, Ser: 0.67 mg/dL (ref 0.44–1.00)
GFR, Estimated: >60 mL/min (ref >60)
Glucose, Bld: 121 mg/dL — ABNORMAL HIGH (ref 70–99)
Potassium: 4.3 mmol/L (ref 3.5–5.1)
Sodium: 139 mmol/L (ref 135–145)
Total Bilirubin: 0.6 mg/dL (ref 0.0–1.2)
Total Protein: 6.5 g/dL (ref 6.5–8.1)

## 2024-05-06 LAB — CBC WITH DIFFERENTIAL/PLATELET
Abs Immature Granulocytes: 0.05 K/uL (ref 0.00–0.07)
Basophils Absolute: 0 K/uL (ref 0.0–0.1)
Basophils Relative: 1 %
Eosinophils Absolute: 0.3 K/uL (ref 0.0–0.5)
Eosinophils Relative: 4 %
HCT: 42.2 % (ref 36.0–46.0)
Hemoglobin: 13.6 g/dL (ref 12.0–15.0)
Immature Granulocytes: 1 %
Lymphocytes Relative: 17 %
Lymphs Abs: 1.1 K/uL (ref 0.7–4.0)
MCH: 29.5 pg (ref 26.0–34.0)
MCHC: 32.2 g/dL (ref 30.0–36.0)
MCV: 91.5 fL (ref 80.0–100.0)
Monocytes Absolute: 0.4 K/uL (ref 0.1–1.0)
Monocytes Relative: 7 %
Neutro Abs: 4.5 K/uL (ref 1.7–7.7)
Neutrophils Relative %: 70 %
Platelets: 215 K/uL (ref 150–400)
RBC: 4.61 MIL/uL (ref 3.87–5.11)
RDW: 14.2 % (ref 11.5–15.5)
WBC: 6.3 K/uL (ref 4.0–10.5)
nRBC: 0 % (ref 0.0–0.2)

## 2024-05-06 MED ORDER — LIDOCAINE 5 % EX PTCH
1.0000 | MEDICATED_PATCH | CUTANEOUS | Status: DC
  Administered 2024-05-06: 1 via TRANSDERMAL
  Filled 2024-05-06: qty 1

## 2024-05-06 MED ORDER — LIDOCAINE 5 % EX PTCH: 1.0000 | MEDICATED_PATCH | Freq: Two times a day (BID) | CUTANEOUS | 0 refills | Status: AC

## 2024-05-06 NOTE — ED Triage Notes (Signed)
 Pt to ed from home ([place]) via POV with a friend for a fall. Pt is caox4, in no acute distress and in a personal wheelchair in triage. Pt did hit her head. Pt denies LOC. Pt is on plavix .

## 2024-05-06 NOTE — ED Provider Notes (Signed)
 [facility] Provider Note    Event Date/Time   First MD Initiated Contact with Patient 05/06/24 1717     (approximate)   History   Chief Complaint Fall   HPI  Lindsey  BERDINE [name] is a 88 y.o. female with past medical history of CAD, HFpEF, cardiac amyloidosis, and COPD/bronchiectasis who presents to the ED complaining of fall.  Patient reports that she was feeling dizzy and lightheaded after standing up in her kitchen today.  She did not pass out, but felt so dizzy that she did not end up falling to the ground and hitting her head on the floor.  She denies losing consciousness but does take Plavix  regularly.  She complains of headache, neck pain, and pain in her lower back.  She denies any pain in her extremities, chest, or abdomen.     Physical Exam   Triage Vital Signs: ED Triage Vitals  Encounter Vitals Group     BP 05/06/24 1704 (!) 161/90     Girls Systolic BP Percentile --      Girls Diastolic BP Percentile --      Boys Systolic BP Percentile --      Boys Diastolic BP Percentile --      Pulse Rate 05/06/24 1704 76     Resp 05/06/24 1704 16     Temp 05/06/24 1704 98 F (36.7 C)     Temp Source 05/06/24 1704 Oral     SpO2 05/06/24 1704 98 %     Weight --      Height 05/06/24 1705 5' 2 (1.575 m)     Head Circumference --      Peak Flow --      Pain Score 05/06/24 1705 3     Pain Loc --      Pain Education --      Exclude from Growth Chart --     Most recent vital signs: Vitals:   05/06/24 1704  BP: (!) 161/90  Pulse: 76  Resp: 16  Temp: 98 F (36.7 C)  SpO2: 98%    Constitutional: Alert and oriented. Eyes: Conjunctivae are normal. Head: Left occipital scalp hematoma with no step-offs. Nose: No congestion/rhinnorhea. Mouth/Throat: Mucous membranes are moist.  Neck: No midline cervical spine tenderness to palpation. Cardiovascular: Normal rate, regular rhythm. Grossly normal heart sounds.  2+ radial pulses  bilaterally. Respiratory: Normal respiratory effort.  No retractions. Lungs CTAB.  No chest wall tenderness to palpation. Gastrointestinal: Soft and nontender. No distention. Musculoskeletal: No lower extremity tenderness nor edema.  No upper extremity bony tenderness to palpation. Neurologic:  Normal speech and language. No gross focal neurologic deficits are appreciated.    ED Results / Procedures / Treatments   Labs (all labs ordered are listed, but only abnormal results are displayed) Labs Reviewed  COMPREHENSIVE METABOLIC PANEL WITH GFR - Abnormal; Notable for the following components:      Result Value   Glucose, Bld 121 (*)    BUN 27 (*)    All other components within normal limits  CBC WITH DIFFERENTIAL/PLATELET     EKG  ED ECG REPORT I, Carlin Palin, the attending physician, personally viewed and interpreted this ECG.   Date: 05/06/2024  EKG Time: 17:10  Rate: 78  Rhythm: normal sinus rhythm  Axis: LAD  Intervals:right bundle branch block and left anterior fascicular block  ST&T Change: None  RADIOLOGY CT head reviewed and interpreted by me with no hemorrhage or midline shift.  PROCEDURES:  Critical Care performed: No  Procedures   MEDICATIONS ORDERED IN ED: Medications - No data to display   IMPRESSION / MDM / ASSESSMENT AND PLAN / ED COURSE  I reviewed the triage vital signs and the nursing notes.                              88 y.o. female with past medical history of CAD, HFpEF, cardiac amyloidosis, COPD/bronchiectasis who presents to the ED complaining of lightheadedness with fall striking her head.  Patient's presentation is most consistent with acute presentation with potential threat to life or bodily function.  Differential diagnosis includes, but is not limited to, intracranial injury, cervical spine injury, lumbar spine injury, arrhythmia, orthostatic hypotension, anemia, electrolyte abnormality, AKI.  Patient nontoxic-appearing and in  no acute distress, vital signs are unremarkable.  EKG shows no evidence of arrhythmia or ischemia, patient reports that she deals with frequent episodes of dizziness and lightheadedness, suspect orthostatic hypotension rather than cardiac etiology.  CT head, cervical spine, and lumbar spine are negative for acute traumatic injury.  No evidence of traumatic injury to her extremities, chest, or abdomen.  Labs are reassuring with no significant anemia, leukocytosis, electrolyte abnormality, or AKI.  LFTs are also unremarkable.  Patient appropriate for discharge home with outpatient follow-up, was counseled to return to the ED for new or worsening symptoms.  Patient agrees with plan.      FINAL CLINICAL IMPRESSION(S) / ED DIAGNOSES   Final diagnoses:  Fall, initial encounter  Hematoma of scalp, initial encounter  Lightheadedness     Rx / DC Orders   ED Discharge Orders     None        Note:  This document was prepared using Dragon voice recognition software and may include unintentional dictation errors.   [name], MD 05/06/24 [name]

## 2024-05-06 NOTE — ED Notes (Signed)
 BLUE top sent down

## 2024-05-08 ENCOUNTER — Other Ambulatory Visit: Payer: Self-pay | Admitting: Cardiovascular Disease

## 2024-05-10 ENCOUNTER — Other Ambulatory Visit

## 2024-05-15 ENCOUNTER — Other Ambulatory Visit: Payer: Self-pay

## 2024-05-15 ENCOUNTER — Emergency Department

## 2024-05-15 ENCOUNTER — Emergency Department
Admission: EM | Admit: 2024-05-15 | Discharge: 2024-05-15 | Disposition: A | Discharge status: Home / Self Care | Attending: Emergency Medicine | Admitting: Emergency Medicine

## 2024-05-15 ENCOUNTER — Telehealth: Payer: Self-pay | Admitting: Cardiovascular Disease

## 2024-05-15 DIAGNOSIS — R079 Chest pain, unspecified: Secondary | ICD-10-CM

## 2024-05-15 DIAGNOSIS — J449 Chronic obstructive pulmonary disease, unspecified: Secondary | ICD-10-CM | POA: Diagnosis not present

## 2024-05-15 DIAGNOSIS — R0789 Other chest pain: Secondary | ICD-10-CM | POA: Diagnosis present

## 2024-05-15 DIAGNOSIS — I10 Essential (primary) hypertension: Secondary | ICD-10-CM | POA: Diagnosis not present

## 2024-05-15 DIAGNOSIS — I2510 Atherosclerotic heart disease of native coronary artery without angina pectoris: Secondary | ICD-10-CM | POA: Diagnosis not present

## 2024-05-15 DIAGNOSIS — Z79899 Other long term (current) drug therapy: Secondary | ICD-10-CM | POA: Insufficient documentation

## 2024-05-15 LAB — CBC
HCT: 42.9 % (ref 36.0–46.0)
Hemoglobin: 13.9 g/dL (ref 12.0–15.0)
MCH: 29.8 pg (ref 26.0–34.0)
MCHC: 32.4 g/dL (ref 30.0–36.0)
MCV: 91.9 fL (ref 80.0–100.0)
Platelets: 240 K/uL (ref 150–400)
RBC: 4.67 MIL/uL (ref 3.87–5.11)
RDW: 14.1 % (ref 11.5–15.5)
WBC: 7.4 K/uL (ref 4.0–10.5)
nRBC: 0 % (ref 0.0–0.2)

## 2024-05-15 LAB — TROPONIN I (HIGH SENSITIVITY)
Troponin I (High Sensitivity): 16 ng/L (ref <18)
Troponin I (High Sensitivity): 18 ng/L — ABNORMAL HIGH (ref <18)

## 2024-05-15 LAB — BASIC METABOLIC PANEL WITH GFR
Anion gap: 13 (ref 5–15)
BUN: 28 mg/dL — ABNORMAL HIGH (ref 8–23)
CO2: 26 mmol/L (ref 22–32)
Calcium: 9.4 mg/dL (ref 8.9–10.3)
Chloride: 102 mmol/L (ref 98–111)
Creatinine, Ser: 0.56 mg/dL (ref 0.44–1.00)
GFR, Estimated: >60 mL/min (ref >60)
Glucose, Bld: 113 mg/dL — ABNORMAL HIGH (ref 70–99)
Potassium: 4 mmol/L (ref 3.5–5.1)
Sodium: 141 mmol/L (ref 135–145)

## 2024-05-15 LAB — PROTIME-INR
INR: 0.9 (ref 0.8–1.2)
Prothrombin Time: 13 s (ref 11.4–15.2)

## 2024-05-15 MED ORDER — CLONIDINE HCL 0.1 MG PO TABS
0.1000 mg | ORAL_TABLET | Freq: Once | ORAL | Status: AC
  Administered 2024-05-15 (×2): 0.1 mg via ORAL
  Filled 2024-05-15: qty 1

## 2024-05-15 NOTE — Telephone Encounter (Signed)
Left message on voicemail to return call to the office

## 2024-05-15 NOTE — ED Triage Notes (Signed)
 Pt comes in via pov with complaints of chest pain that started on Saturday. Pt states that she spoke with her primary provider, who sent her to the ER to be seen. Her chest pain comes and goes, but when she feels the pain it is 5/10.

## 2024-05-15 NOTE — Telephone Encounter (Signed)
 Left voicemail message to call back

## 2024-05-15 NOTE — ED Provider Notes (Signed)
 [facility] Provider Note    Event Date/Time   First MD Initiated Contact with Patient 05/15/24 2058     (approximate)  History   Chief Complaint: Chest Pain  HPI  Lindsey  H [name] is a 88 y.o. female with a past medical history of anemia, CAD, COPD, presents to the emergency department for chest pain.  According to the patient over the last 3 days or so she has been experiencing intermittent chest pains.  She describes it as a brief 1 to 2-second pain in the left chest occurring every few seconds for a minute or 2 and then alleviating.  Patient states she has not had any chest pain since arriving to the emergency department (over 6 hours).  Patient denies any shortness of breath at any point.  No pleuritic pain.  Physical Exam   Triage Vital Signs: ED Triage Vitals  Encounter Vitals Group     BP 05/15/24 1637 (!) 163/106     Girls Systolic BP Percentile --      Girls Diastolic BP Percentile --      Boys Systolic BP Percentile --      Boys Diastolic BP Percentile --      Pulse Rate 05/15/24 1637 84     Resp 05/15/24 1637 18     Temp 05/15/24 1637 98.6 F (37 C)     Temp Source 05/15/24 2002 Oral     SpO2 05/15/24 1637 97 %     Weight 05/15/24 1637 120 lb (54.4 kg)     Height 05/15/24 1637 5' 2 (1.575 m)     Head Circumference --      Peak Flow --      Pain Score 05/15/24 1637 5     Pain Loc --      Pain Education --      Exclude from Growth Chart --     Most recent vital signs: Vitals:   05/15/24 2120 05/15/24 2146  BP: (!) 182/92 (!) 182/92  Pulse:    Resp:    Temp:    SpO2:      General: Awake, no distress.  CV:  Good peripheral perfusion.  Regular rate and rhythm  Resp:  Normal effort.  Equal breath sounds bilaterally.  Abd:  No distention.  Soft no epigastric or upper abdominal tenderness.  ED Results / Procedures / Treatments   EKG  EKG viewed and interpreted by myself shows a normal sinus rhythm at 80 bpm with a widened QRS,  left axis deviation, largely normal intervals with nonspecific ST changes.  No ST elevation.  RADIOLOGY  I have reviewed interpret the chest x-ray images.  No obvious consolidation on my evaluation. Radiology is read the x-ray is negative   MEDICATIONS ORDERED IN ED: Medications  cloNIDine  (CATAPRES ) tablet 0.1 mg (0.1 mg Oral Given 05/15/24 2146)     IMPRESSION / MDM / ASSESSMENT AND PLAN / ED COURSE  I reviewed the triage vital signs and the nursing notes.  Patient's presentation is most consistent with acute presentation with potential threat to life or bodily function.  Patient presents to the emergency department for intermittent chest pain over the last 3 days or so.  Patient describes the chest pain as intermittent lasting only a second or 2 when it occurs but then reoccurring every few seconds or minutes and then going away for sometimes hours or days and then recurring.  No association with exertion movement food deep breathing.  Patient does state  a cough but states this is chronic and no association with the cough.  Patient sees Dr. Gollan of cardiology.  As a secondary complaint patient states her blood pressure has been elevated recently usually around 180.  Currently 182/92 in the emergency department.  Patient states she took her lisinopril  10 mg tablet this morning has not been using her hydralazine  which is only prescribed as needed per patient.  Patient's workup in the emergency department is reassuring normal CBC, normal chemistry.  Troponin largely unchanged x 2.  Patient's chest x-ray shows no concerning finding and EKG is reassuring.  As the patient is chest pain-free with a reassuring workup I believe the patient could be discharged home to follow-up with her cardiologist.  I sent Dr. Gollan a message to hopefully expedite the follow-up.  Provided my typical chest pain return precautions.  Patient agreeable to plan of care.  FINAL CLINICAL IMPRESSION(S) / ED DIAGNOSES    Chest pain    Note:  This document was prepared using Dragon voice recognition software and may include unintentional dictation errors.   Dorothyann Drivers, MD 05/15/24 2232

## 2024-05-15 NOTE — Telephone Encounter (Signed)
 Patient returned call states that she is still having CP that is temporarily relieved by nitroglycerin  then returns a few hours later - advised patient to call 911 or have someone bring her to ED, patient reluctant and asked for office visit, advised her that we would only send her to the ED - patient states that she will try to find someone

## 2024-05-15 NOTE — Telephone Encounter (Signed)
 Pt c/o of Chest Pain: STAT if active (IN THIS MOMENT) CP, including tightness, pressure, jaw pain, shoulder/upper arm/back pain, SOB, nausea, and vomiting.  1. Are you having CP right now (tightness, pressure, or discomfort)? No, but last time she felt it was earlier this morning.   2. Are you experiencing any other symptoms (ex. SOB, nausea, vomiting, sweating)? No  3. How long have you been experiencing CP? Started Saturday morning   4. Is your CP continuous or coming and going? Coming and going   5. Have you taken Nitroglycerin ? Yes  6. If CP returns before callback, please consider calling 911. ?

## 2024-05-15 NOTE — ED Notes (Signed)
 Introduced self to pt. Pt in NAD at this time.

## 2024-05-16 ENCOUNTER — Telehealth: Payer: Self-pay | Admitting: Cardiovascular Disease

## 2024-05-16 NOTE — Telephone Encounter (Signed)
 Left voicemail, pt needs to be scheduled for an office visit as soon as possible, per Dr. Gollan.

## 2024-05-16 NOTE — Telephone Encounter (Signed)
 Left voicemail message to call back.  [name], [name]  [name] - 05/15/2024 10:46 AM [name], [name] PARAS, MD  Sent: Tue May 16, 2024  8:01 AM  To: [name], RN; [name], RN   Message  Can we get her an appointment in clinic please  Thx  TGollan

## 2024-05-16 NOTE — Telephone Encounter (Signed)
-----   Message from Nurse Jon R sent at 05/16/2024 10:50 AM EDT ----- Regarding: OV Patient needs OV asap per Gollan

## 2024-05-16 NOTE — Telephone Encounter (Signed)
 Left voicemail message to call back

## 2024-05-17 ENCOUNTER — Encounter: Payer: Self-pay | Admitting: Cardiology

## 2024-05-17 ENCOUNTER — Ambulatory Visit: Attending: Cardiology | Admitting: Cardiology

## 2024-05-17 ENCOUNTER — Other Ambulatory Visit: Payer: Self-pay

## 2024-05-17 VITALS — BP 134/84 | HR 73 | Ht 62.0 in | Wt 123.8 lb

## 2024-05-17 DIAGNOSIS — Z86718 Personal history of other venous thrombosis and embolism: Secondary | ICD-10-CM

## 2024-05-17 DIAGNOSIS — I10 Essential (primary) hypertension: Secondary | ICD-10-CM

## 2024-05-17 DIAGNOSIS — I739 Peripheral vascular disease, unspecified: Secondary | ICD-10-CM

## 2024-05-17 DIAGNOSIS — R072 Precordial pain: Secondary | ICD-10-CM

## 2024-05-17 DIAGNOSIS — I5032 Chronic diastolic (congestive) heart failure: Secondary | ICD-10-CM

## 2024-05-17 DIAGNOSIS — I25810 Atherosclerosis of coronary artery bypass graft(s) without angina pectoris: Secondary | ICD-10-CM

## 2024-05-17 DIAGNOSIS — E785 Hyperlipidemia, unspecified: Secondary | ICD-10-CM

## 2024-05-17 MED ORDER — FUROSEMIDE 20 MG PO TABS: 20.0000 mg | ORAL_TABLET | Freq: Every day | ORAL | 1 refills | Status: AC | PRN

## 2024-05-17 MED ORDER — CLOPIDOGREL BISULFATE 75 MG PO TABS: 75.0000 mg | ORAL_TABLET | Freq: Every day | ORAL | 3 refills | Status: AC

## 2024-05-17 MED ORDER — POTASSIUM CHLORIDE CRYS ER 10 MEQ PO TBCR: 10.0000 meq | EXTENDED_RELEASE_TABLET | Freq: Every day | ORAL | 1 refills | Status: AC

## 2024-05-17 MED ORDER — EZETIMIBE 10 MG PO TABS: 10.0000 mg | ORAL_TABLET | Freq: Every day | ORAL | 3 refills | Status: AC

## 2024-05-17 MED ORDER — LISINOPRIL 20 MG PO TABS: 10.0000 mg | ORAL_TABLET | Freq: Every day | ORAL | 3 refills | Status: DC

## 2024-05-17 NOTE — Progress Notes (Signed)
 Cardiology Office Note   Date:  05/17/2024  ID:  Lindsey  Lindsey [name], DOB 1936/01/09, MRN [id number] PCP: Sadie Manna, MD  [facility] Providers Cardiologist:  [name] Lunger, MD     History of Present Illness Lindsey  JAZZALYNN [name] is a 88 y.o. female with a past medical history of coronary artery disease, peripheral edema with venous insufficiency, right DVT status post IVC filter, bronchiectasis on inhalers and nebulizers, previous back surgery, chronic heart failure with improved ejection fraction, atrial tachycardia, COPD, PAD, who is being seen today after recent evaluation in the emergency department.   She was admitted to [facility] on 03/01/2022 for acute on chronic CHF-with an NSTEMI.  High-sensitivity troponin peaked at 1044, BNP was elevated at 1740.  She also had mildly elevated LFTs.  Chest x-ray revealed cardiomegaly with mild diffuse interstitial opacity and small pleural effusions consistent with edema.  Twelve-lead EKG revealed PACs, left anterior fascicular block, chronic RBBB and T wave inversions in inferolateral leads.  She was started on heparin  and IV Lasix .  She underwent right and left heart catheterization that showed severe single-vessel coronary artery disease with complete occlusion of the mid left circumflex with left to left and right to left collaterals.  Cardiology recommended medical management with DAPT and escalation of GDMT.  Echocardiogram revealed LVEF of 40-45%, regional wall motion abnormalities and a G3 DD.  She was subsequently diuresed and was down to 4.5 L throughout her hospitalization.  She was considered stable for discharge and was discharged home on 03/04/2022.   She presented again and was admitted to [facility] on 04/11/2022 with shortness of breath and a low pulse ox at home.  Patient stated she had dyspnea on exertion and leg swelling.  She complained of cough with pinkish phlegm.  In the ED she had elevated blood pressure and pulse ox was 88% on room air  requiring 2 L of oxygen therapy to maintain oxygen in the 90s.  High-sensitivity troponin was 30 and a BNP of 985.  CBC and CMP with elevated AST/ALT of 95/80.  Urinalysis with large leukocyte.  EKG without ST or T wave changes.  Chest x-ray showed cardiomegaly with vascular congestion and diffuse bilateral interstitial and groundglass opacities suspicious for pulmonary edema.  She was then admitted to the hospital for further evaluation and treatment.  She continued with IV diureses and symptomatically improved.  She was transition to oral Lasix  at discharge.  She was also continued on losartan  and bisoprolol  it was recommended she continue with daily weights and low-sodium diet and fluid restriction.  She was considered safe for discharge and was discharged on 04/14/2022.   She presented to the [facility] emergency department on 05/07/2022 with complaint of chest pain.  It started early in the night lasted for about an hour and a half and she decided to come to the emergency department for further evaluation.  Her chest wall was tender to palpation but there was no pain at rest.  She denied any other associated symptoms of shortness of breath.  She was noted to be hypertensive with a blood pressure of 175/106.  High-sensitivity troponin were 22 and 24 not consistent with ACS, EKG showed no evidence of ischemia, chest x-ray was clear, given her unrevealing workup she was discharged home with cardiology follow-up.  She was seen in clinic 10/2023 in the setting of ongoing hypotension despite discontinuation of bisoprolol  and losartan  reducing Lasix  dosing throughout the week.  She was orthostatic on examination with the setting of resting  hypertension continue conservative management and vigilance with position changing of the balance.  In the setting of evaluation for monoclonal gammopathy with oncology, after discussion with Dr. [name], recommendation was made for PYP scan.  This was performed in February and was notable  for myocardial uptake of radiotracer and suggestive of ATTR amyloidosis.  Referral was made to advanced heart failure clinic.  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  Scheduled for cardiac MRI to further evaluate.  She was also advised to start using compression hose, avoid overdiuresis, and added midodrine as needed.  She was last seen in clinic 04/11/2024 by Dr. Gollan for near syncopal event.  She reported having orthostasis symptoms on the 5 mg of lisinopril  5 mg of amlodipine .  Continue to have supine hypertension with blood pressure 150/83.  Cardiac MRI was discussed and it revealed no infiltrate concerning for amyloid.  She was continued on her current medication regimen and no further testing was ordered at that time.   She was evaluated in the [facility] emergency department after a fall 05/06/2024.  She denies losing consciousness but takes Plavix  regularly.  Blood pressure was suboptimally controlled at 161/90.  EKG showed no evidence of arrhythmia or ischemia.  Patient reports that she deals with frequent episodes of dizziness and lightheadedness.  Suspect orthostatic hypotension rather than cardiac etiology.  CT of the head, cervical spine, lumbar spine were negative for acute traumatic injury.  No evidence of traumatic injury to the upper extremities, chest, or abdomen.  Labs are reassuring with no significant anemia.  LFTs were unremarkable.  She was deemed stable for discharge.  Patient was evaluated in [facility] emergency department 05/15/2024 with complaint of chest pain.  States the chest pain was lasting 3 days or so she been experiencing intermittent chest discomfort that was brief 1 to 2 seconds pain in the left chest occurring every few seconds per minute.  She states she has not had any chest pain since arriving to the emergency department denies any other associated symptoms.  Blood pressure was 163/106 remainder  of the vital signs were stable.  She was given clonidine  0.1 mg tablet for blood pressure.  Patient's workup in the emergency department is reassuring with blood work that was normal high-sensitivity troponin unchanged x 2 chest x-ray showed no concerning findings and EKG was reassuring.  She was discharged home with close follow-up with cardiology.  She returns to clinic today with concerns of her recent emergency department visit with chest discomfort.  She also states that since that time she has been more fatigued.  She states that the chest discomfort was spasmodic and happened with rest and exertion.  She she points to midsternal area of her chest when describing the discomfort.  She also had associated shortness of breath and dyspnea on exertion.  See also stated that she used her nitro and it did relieve her discomfort.  Since that time she has noted to have elevated blood pressures ranging from 130s to 180s systolic.  She has been compliant with her current medication regimen.  She has several questions today related about testing and medication changes.  ROS: 10 point review of systems has been reviewed and considered negative except what is listed in the HPI  Studies Reviewed     cMRI 04/03/2024 IMPRESSION: 1.  Normal LV size, low normal LV systolic function.  LVEF 50%.   2.  No LGE or scar.  3. Normal ECV with no evidence for infiltrative disease/amyloidosis.   4.  Normal RV function.   5.  Mild mitral regurgitation.  PYP 11/11/2023   Myocardial uptake was positive for radiotracer uptake. The visual grade of myocardial uptake relative to the ribs was Grade 2 (Myocardial uptake equal to rib uptake).   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   Prior study not available for comparison.  2D echo 08/18/2023 1. Left ventricular ejection fraction, by estimation, is 50 to 55%. The  left ventricle has low normal function. The left ventricle has no regional   wall motion abnormalities. There is moderate left ventricular hypertrophy.  Left ventricular diastolic  parameters are consistent with Grade I diastolic dysfunction (impaired  relaxation).   2. Right ventricular systolic function is low normal. The right  ventricular size is normal. There is normal pulmonary artery systolic  pressure.   3. The mitral valve is normal in structure. Mild mitral valve  regurgitation. No evidence of mitral stenosis.   4. Tricuspid valve regurgitation is mild to moderate.   5. The aortic valve has an indeterminant number of cusps. Aortic valve  regurgitation is mild. Aortic valve sclerosis is present, with no evidence  of aortic valve stenosis.   6. The inferior vena cava is normal in size with greater than 50%  respiratory variability, suggesting right atrial pressure of 3 mmHg.    Cardiac cath 03/03/22 Severe single vessel coronary artery disease with occlusion of mid LCx with left-to-left and right-to-left collaterals supplying distal OM branches.  Given patient's history of chest pain ~3 weeks ago following by progressive heart failure symptoms, I suspect her initial coronary event occurred at that time. Mild-moderate, non-obstructive CAD involving the LAD and RCA. Mildly-moderately elevated left heart filling pressures. Moderate pulmonary hypertension. Normal Fick cardiac output/index.   Recommendations: aspirin  and clopidogrel  for 12 months. No role for PCI to occluded LCx at this time, as event likely occurred ~3 weeks ago.   Echo 03/04/22  1. Left ventricular ejection fraction, by estimation, is 40 to 45%. The  left ventricle has mildly decreased function. The left ventricle  demonstrates regional wall motion abnormalities There is mild left  ventricular hypertrophy. Left ventricular diastolic parameters are  consistent with Grade III diastolic dysfunction (restrictive). There is  moderate hypokinesis of the left ventricular, basal-mid  inferolateral wall.   2. Right ventricular systolic function is low normal. The right ventricular size is normal.   3. The mitral valve was not well visualized. Mild mitral valve regurgitation.   4. The aortic valve was not well visualized. Aortic valve regurgitation is mild.   5. The inferior vena cava is dilated in size with <50% respiratory variability, suggesting right atrial pressure of 15 mmHg.  Risk Assessment/Calculations           Physical Exam VS:  BP 134/84   Pulse 73   Ht 5' 2 (1.575 m)   Wt 123 lb 12.8 oz (56.2 kg)   SpO2 97%   BMI 22.64 kg/m        Wt Readings from Last 3 Encounters:  05/17/24 123 lb 12.8 oz (56.2 kg)  05/15/24 120 lb (54.4 kg)  04/11/24 122 lb 6 oz (55.5 kg)    GEN: Well nourished, well developed in no acute distress NECK: No JVD; No carotid bruits CARDIAC: RRR, no murmurs, rubs, gallops RESPIRATORY:  Clear to auscultation without rales, wheezing or rhonchi  ABDOMEN: Soft, non-tender, non-distended EXTREMITIES: Trace pretibial edema;  No deformity   ASSESSMENT AND PLAN Coronary artery disease with ongoing chest pain concerning for angina with known occlusion of the left circumflex by catheterization May 2023 with left-to-right to left collaterals and otherwise nonobstructive disease.  Recently evaluated in the emergency department high-sensitivity troponins of 16 and 18 which trended flat not consistent with ACS.  Since that time she has noted worsening fatigue and worsening labile blood pressures.  She has been continued on aspirin  81 mg daily, Plavix  75 mg daily, ezetimibe  10 mg daily, and Lovaza  2 g twice daily.  With her recent emergency department visit and several previous episodes of chest discomfort she has been scheduled for a Lexiscan Myoview to complete her ischemic workup.  Chronic heart failure with improved ejection fraction/abnormal PYP scan/negative cardiac MRI for amyloid.  Last echocardiogram was completed 08/2023 with an LVEF of 50  to 75%, no RWMA, and moderate LVH, mild mitral regurgitation, mild to moderate tricuspid regurgitation, mild aortic regurgitation and aortic valve sclerosis without evidence of stenosis.  She appears to be euvolemic on exam.  She has has needed furosemide  which she states that she has not taken.  Continues to suffer from NYHA class II symptoms.  She is also continued on lisinopril .  Hyperlipidemia with LDL 33 which is at goal of less than 55.  She is continued on ezetimibe  10 mg daily and Lovaza  2 g twice daily.  Previously was on atorvastatin  but had myalgias and had to be taken off.  Labile blood pressures where she has been extremely hypertensive with blood pressures of 130s to 180s systolic.  She was also elevated in the 160s over 100s in the emergency department.  She denies any orthostasis type symptoms and even dizziness at this time.  She has not taking any of her as needed hydralazine  for systolic blood pressure greater than 160.  With continuing elevated blood pressures her lisinopril  was been increased to 20 mg daily with continued as needed furosemide  and hydralazine .  Repeat BMP ordered for 2 weeks.  She has been encouraged to monitor pressure 1 to 2 hours postmedication administration at home as well.  History of DVT with no recurrent episodes.  She is no longer on oral anticoagulant therapy.  Peripheral arterial disease disease/carotid disease with her last carotid duplex completed 03/2023 revealed a right carotid with 1-39% stenosis in the left carotid with no significant stenosis.  She is continued on aspirin , clopidogrel , ezetimibe .     Informed Consent   Shared Decision Making/Informed Consent The risks [chest pain, shortness of breath, cardiac arrhythmias, dizziness, blood pressure fluctuations, myocardial infarction, stroke/transient ischemic attack, nausea, vomiting, allergic reaction, radiation exposure, metallic taste sensation and life-threatening complications (estimated to be 1  in 10,000)], benefits (risk stratification, diagnosing coronary artery disease, treatment guidance) and alternatives of a nuclear stress test were discussed in detail with Ms. Madrazo and she agrees to proceed.     Dispo: Patient to return to clinic to see MD/APP in 6 weeks or sooner if needed after testing is completed  Signed, Mattalynn Crandle, NP

## 2024-05-17 NOTE — Patient Instructions (Signed)
 Medication Instructions:  Your physician recommends the following medication changes.  INCREASE: Lisinopril  to 20 mg daily  *If you need a refill on your cardiac medications before your next appointment, please call your pharmacy*  Lab Work: Your provider would like for you to return in 2 weeks  to have the following labs drawn: BMP.   Please go to [facility] [address] ([facility] [address], [place] [address] You do not need an appointment.  They are open from 8 am- 4:30 pm.  Lunch from 1:00 pm- 2:00 pm You DO NOT need to be fasting.   You may also go to one of the following [facility]:  [address] [place], [place] [address] Phone: [phone] Lab hours: Mon-Fri 8 am- 5 pm    Lunch 12 pm- 1 pm  [address] [place],  [place]  [address]  [place]  Phone: [phone] Lab hours: 7 am- 4 pm Lunch 12 pm-1 pm   [address] [place],  [place]  [address]  [place]  Phone: [phone] Lab hours: Mon-Fri 8 am- 5 pm    Lunch 12 pm- 1 pm  If you have labs (blood work) drawn today and your tests are completely normal, you will receive your results only by: MyChart Message (if you have MyChart) OR A paper copy in the mail If you have any lab test that is abnormal or we need to change your treatment, we will call you to review the results.  Testing/Procedures: Your provider has ordered a Lexiscan/ Exercise Myoview Stress test. This will take place at [facility]. Please report to the [facility] medical mall entrance. The volunteers at the first desk will direct you where to go.  [facility] MYOVIEW  Your provider has ordered a Stress Test with nuclear imaging. The purpose of this test is to evaluate the blood supply to your heart muscle. This procedure is referred to as a Non-Invasive Stress Test. This is because other than having an IV started in your vein, nothing is inserted or invades your body. Cardiac stress tests are done to find areas of poor blood flow to the heart by  determining the extent of coronary artery disease (CAD). Some patients exercise on a treadmill, which naturally increases the blood flow to your heart, while others who are unable to walk on a treadmill due to physical limitations will have a pharmacologic/chemical stress agent called Lexiscan . This medicine will mimic walking on a treadmill by temporarily increasing your coronary blood flow.   Please note: these test may take anywhere between 2-4 hours to complete  How to prepare for your Myoview test:  Nothing to eat for 6 hours prior to the test No caffeine  for 24 hours prior to test No smoking 24 hours prior to test. Your medication may be taken with water.  If your doctor stopped a medication because of this test, do not take that medication. Ladies, please do not wear dresses.  Skirts or pants are appropriate. Please wear a short sleeve shirt. No perfume, cologne or lotion. Wear comfortable walking shoes. No heels!   PLEASE NOTIFY THE OFFICE AT LEAST 24 HOURS IN ADVANCE IF YOU ARE UNABLE TO KEEP YOUR APPOINTMENT.  [phone] AND  PLEASE NOTIFY NUCLEAR MEDICINE AT [facility] AT LEAST 24 HOURS IN ADVANCE IF YOU ARE UNABLE TO KEEP YOUR APPOINTMENT. [phone]   Follow-Up: At [facility], you and your health needs are our priority.  As part of our continuing mission to provide you with exceptional heart care,  our providers are all part of one team.  This team includes your primary Cardiologist (physician) and Advanced Practice Providers or APPs (Physician Assistants and Nurse Practitioners) who all work together to provide you with the care you need, when you need it.  Your next appointment:   6 week(s)   Provider:   Timothy Gollan, MD

## 2024-05-22 ENCOUNTER — Other Ambulatory Visit: Payer: Self-pay

## 2024-05-22 ENCOUNTER — Other Ambulatory Visit: Payer: Self-pay | Admitting: Cardiology

## 2024-05-22 MED ORDER — LISINOPRIL 20 MG PO TABS
20.0000 mg | ORAL_TABLET | Freq: Every day | ORAL | Status: DC
Start: 2024-05-22 — End: 2025-05-22

## 2024-06-07 ENCOUNTER — Encounter
Admission: RE | Admit: 2024-06-07 | Discharge: 2024-06-07 | Disposition: A | Discharge status: Home / Self Care | Source: Ambulatory Visit | Attending: Cardiology | Admitting: Cardiology

## 2024-06-07 DIAGNOSIS — R072 Precordial pain: Secondary | ICD-10-CM | POA: Diagnosis present

## 2024-06-07 MED ORDER — TECHNETIUM TC 99M TETROFOSMIN IV KIT
30.7900 | PACK | Freq: Once | INTRAVENOUS | Status: AC | PRN
  Administered 2024-06-07: 30.79 via INTRAVENOUS

## 2024-06-07 MED ORDER — TECHNETIUM TC 99M TETROFOSMIN IV KIT
10.6200 | PACK | Freq: Once | INTRAVENOUS | Status: AC | PRN
  Administered 2024-06-07: 10.62 via INTRAVENOUS

## 2024-06-07 MED ORDER — REGADENOSON 0.4 MG/5ML IV SOLN
0.4000 mg | Freq: Once | INTRAVENOUS | Status: AC
  Administered 2024-06-07: 0.4 mg via INTRAVENOUS
  Filled 2024-06-07: qty 5

## 2024-06-09 ENCOUNTER — Ambulatory Visit: Payer: Self-pay | Admitting: Cardiology

## 2024-06-09 LAB — NM MYOCAR MULTI W/SPECT W/WALL MOTION / EF
LV dias vol: 118 mL (ref 46–106)
LV sys vol: 55 mL (ref 3.8–5.2)
Nuc Stress EF: 53 %
Peak HR: 80 {beats}/min
Rest HR: 78 {beats}/min
Rest Nuclear Isotope Dose: 10.6 mCi
SDS: 1
SRS: 0
SSS: 2
ST Depression (mm): 0 mm
Stress Nuclear Isotope Dose: 30.8 mCi
TID: 1.25

## 2024-06-09 NOTE — Progress Notes (Signed)
 No significant ischemia noted.  No EKG changes concerning for ischemia at peak stress or in recovery.  Considered a low risk scan.

## 2024-06-12 NOTE — Telephone Encounter (Signed)
 Yes, she can return to water aerobics and continue her regular activity.

## 2024-06-23 ENCOUNTER — Telehealth: Payer: Self-pay | Admitting: Internal Medicine

## 2024-07-03 NOTE — Progress Notes (Deleted)
 NO SHOW

## 2024-07-04 ENCOUNTER — Ambulatory Visit: Attending: Cardiovascular Disease | Admitting: Cardiovascular Disease

## 2024-07-26 ENCOUNTER — Other Ambulatory Visit: Payer: Self-pay | Admitting: Cardiology

## 2024-07-26 ENCOUNTER — Telehealth: Payer: Self-pay | Admitting: Adult Health

## 2024-07-26 NOTE — Telephone Encounter (Signed)
 Called to confirm/remind patient of their appointment at the [facility] on 07/27/24.   Appointment:   [] Confirmed  [x] Left mess   [] No answer/No voice mail  [] VM Full/unable to leave message  [] Phone not in service  Patient reminded to bring all medications and/or complete list.  Confirmed patient has transportation. Gave directions, instructed to utilize valet parking.

## 2024-07-26 NOTE — Progress Notes (Deleted)
 ADVANCED HF CLINIC NOTE  Referring Physician: Sadie Manna, MD Primary Care: Sadie Manna, MD Primary Cardiologist: [name] Lunger, MD  Chief Complaint: Heart failure  HPI:  Lindsey [name] is an 88 y.o. 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 Recently here bisoprolol  and losartan  had to be stopped due to orthostatic hypotension. A PYP scan was ordered. PYP on 11/11/23 was read as Grade II concerning for TTY cardiac amyloidosis. I have reviewed this study personally and think it is equivocal for the presence of cardiac amyloid.   Seen by [facility] and diagnosed with [facility] Labs show:  SPEP with a very small M-spike 0.2 with igG lambda light chain specificity UPEP no m-spike Free light changes normal  Echo 5/23 EF 40-45% (felt to be ischemic in nature) Echo 6/24 EF 55-60% Echo 11/24 EF 50-55% Mod LVH. No effusion. RV mildly HK   Admitted 3/1-12/2023 with worsening gait imbalance. MRI/MRA negative. 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 Abilify  held   CMRI 03/2024- no evidence of infiltrative disease.   Today she returns for HF follow up.Overall feeling fine. Denies SOB/PND/Orthopnea. Appetite ok. No fever or chills. Weight at home  pounds. Taking all medications   Past Medical History:  Diagnosis Date   Anemia    Atrial tachycardia    Breast cancer (HCC)    Bronchiectasis (HCC) 06/2007   CAD (coronary artery disease)    a. 02/2022 Cath: LM nl, LAD 50m, D23 40, LCX 121m/d w/ L->L and R->L collats, RCA 66m-->Med Rx.   Cardiac amyloidosis (HCC)    a. 11/2023 PYP: + radiotracer uptake suggestive of ATTR.   Chronic Dizziness    Colitis    COPD (chronic obstructive pulmonary  disease) (HCC)    Depression    Diastolic dysfunction    a. 12/2019 Echo: EF 60-65%, no rwma, GrII DD. Nl RV size/fxn. Mild MR/AI.   DVT (deep venous thrombosis) (HCC)    after her right knee surgery.    Fibromyalgia    Heart failure with improved ejection fraction (HFimpEF) (HCC)    a. 02/2022 Echo: EF 40-45%, GrIII DD; b. 03/2023 Echo: EF 55-60%; c. 08/2023 Echo: EF 50-55%, mod LVH, GrI DD, low-nl RV fxn, ml PASP, mild MR/AI, mild-mod TR, AoV sclerosis.   Ischemic cardiomyopathy    a. 02/2022 Echo: EF 40-45%; b. 03/2023 Echo: EF 55-60%; c. 08/2023 Echo: EF 50-55%.   Labile Hypertension    Migraine headache with aura    Osteoarthritis    Osteoporosis    PAD (peripheral artery disease)    Pneumonia    Polio 1952   Stenosis of carotid artery     Current Outpatient Medications  Medication Sig Dispense Refill   acetaminophen  (TYLENOL ) 500 MG tablet Take 500 mg by mouth at bedtime as needed for moderate pain.     albuterol  (VENTOLIN  HFA) 108 (90 Base) MCG/ACT inhaler Inhale 2 puffs into the lungs every 6 (six) hours as needed for wheezing or shortness of breath.     aspirin  EC 81 MG tablet Take 1 tablet (81 mg total) by mouth daily. Swallow whole.     budesonide  (ENTOCORT EC ) 3 MG 24 hr capsule Take 3 mg by mouth. Taper dose     clopidogrel  (PLAVIX ) 75  MG tablet Take 1 tablet (75 mg total) by mouth daily after breakfast. 90 tablet 3   cyanocobalamin  (VITAMIN B12) 1000 MCG tablet Take 1 tablet (1,000 mcg total) by mouth daily.     cyclobenzaprine  (FLEXERIL ) 5 MG tablet Take 5 mg by mouth at bedtime as needed.     ezetimibe  (ZETIA ) 10 MG tablet Take 1 tablet (10 mg total) by mouth daily. 90 tablet 3   furosemide  (LASIX ) 20 MG tablet Take 1 tablet (20 mg total) by mouth daily as needed for fluid or edema (daily weight gain of 2 or more pounds). 90 tablet 1   hydrALAZINE  (APRESOLINE ) 25 MG tablet Qd prn for elevated BP > 160 systolic     hydrOXYzine  (ATARAX ) 25 MG tablet Take 25 mg by mouth every  4 (four) hours as needed.     lidocaine  (LIDODERM ) 5 % Place 1 patch onto the skin every 12 (twelve) hours. Remove & Discard patch within 12 hours or as directed by MD 10 patch 0   lisinopril  (ZESTRIL ) 20 MG tablet TAKE ONE-HALF TABLET (10 MG TOTAL) BY MOUTH ONCE DAILY 45 tablet 3   nitroGLYCERIN  (NITROLINGUAL ) 0.4 MG/SPRAY spray Place 1 spray under the tongue every 5 (five) minutes x 3 doses as needed for chest pain. 12 g 12   omega-3 acid ethyl esters (LOVAZA ) 1 g capsule Take 2 g by mouth 2 (two) times daily.     polyethylene glycol (MIRALAX  / GLYCOLAX ) packet Take 17 g by mouth daily as needed for mild constipation or moderate constipation.     potassium chloride  (KLOR-CON  M) 10 MEQ tablet Take 1 tablet (10 mEq total) by mouth daily. 90 tablet 1   predniSONE  (DELTASONE ) 50 MG tablet Take one tablet 13 hours, 7 hours, and 1 hour prior to scan. (Patient not taking: Reported on 05/17/2024) 3 tablet 0   venlafaxine  XR (EFFEXOR -XR) 150 MG 24 hr capsule Take 150 mg by mouth daily.     No current facility-administered medications for this visit.    Allergies  Allergen Reactions   Ivp Dye [Iodinated Contrast Media] Anaphylaxis   Charentais Melon (French Melon) Other (See Comments)    All melons cause stomach pain and upset   Codeine Other (See Comments)    Dizziness  Dizziness   Cucumber Extract    Demeclocycline Other (See Comments)    Stomach upset    Galcanezumab -Gnlm Itching   Repatha  [Evolocumab ] Dermatitis    LUMP AT AREA OF SHOT AND ITCHING   Sulfa Antibiotics Hives   Tegaderm Ag Mesh [Silver]    Telmisartan      dizziness Other reaction(s): Dizziness dizziness   Tetracyclines & Related     Stomach upset    Wild Lettuce Extract (Lactuca Virosa) Other (See Comments)    Stomach upset and pain      Social History   Socioeconomic History   Marital status: Widowed    Spouse name: Not on file   Number of children: Not on file   Years of education: Not on file   Highest  education level: Not on file  Occupational History   Not on file  Tobacco Use   Smoking status: Never   Smokeless tobacco: Never  Vaping Use   Vaping status: Never Used  Substance and Sexual Activity   Alcohol  use: Yes    Alcohol /week: 4.0 standard drinks of alcohol     Types: 4 Glasses of wine per week    Comment: a glass of wine every other day  Drug use: No   Sexual activity: Not on file  Other Topics Concern   Not on file  Social History Narrative   Not on file   Social Drivers of Health   Financial Resource Strain: Low Risk  (02/08/2024)   [facility]   Overall Financial Resource Strain (CARDIA)    Difficulty of Paying Living Expenses: Not hard at all  Food Insecurity: No Food Insecurity (02/08/2024)   Received from [facility]   Hunger Vital Sign    Within the past 12 months, you worried that your food would run out before you got the money to buy more.: Never true    Within the past 12 months, the food you bought just didn't last and you didn't have money to get more.: Never true  Transportation Needs: No Transportation Needs (02/08/2024)   Received [facility] - Transportation    In the past 12 months, has lack of transportation kept you from medical appointments or from getting medications?: No    Lack of Transportation (Non-Medical): No  Physical Activity: Not on file  Stress: Not on file  Social Connections: Moderately Integrated (12/04/2023)   Social Connection and Isolation Panel    Frequency of Communication with Friends and Family: More than three times a week    Frequency of Social Gatherings with Friends and Family: Three times a week    Attends Religious Services: 1 to 4 times per year    Active Member of Clubs or Organizations: Yes    Attends Banker Meetings: 1 to 4 times per year    Marital Status: Widowed  Intimate Partner Violence: Not At Risk (12/04/2023)    Humiliation, Afraid, Rape, and Kick questionnaire    Fear of Current or Ex-Partner: No    Emotionally Abused: No    Physically Abused: No    Sexually Abused: No      Family History  Problem Relation Age of Onset   Pancreatic cancer Mother    Arthritis Mother    Ovarian cancer Mother    Arthritis Father    Bladder Cancer Father    Atrial fibrillation Sister    Endometrial cancer Sister    Colon cancer Sister     There were no vitals filed for this visit.   PHYSICAL EXAM: General:   No resp difficulty Neck: no JVD.  Cor: Regular rate & rhythm. Lungs: clear Abdomen: soft, nontender, nondistended.  Extremities: no  edema Neuro: alert & oriented x3   ASSESSMENT & PLAN:  1. Chronic heart failure with improved ejection fraction/abnormal PYP scan: - Echo 5/23 EF 40-45% (felt to be ischemic in nature) - Echo 6/24 EF 55-60% - Echo 11/24 EF 50-55% Mod LVH. No effusion. RV mildly HK  - PYP 11/11/23 read as Grade II concerning for TTY cardiac amyloidosis. Dr Cherrie reviewed this study personally and think it is equivocal for the presence of cardiac amyloid.  - SPEP with a very small M-spike 0.2 with igG lambda light chain specificity - UPEP no m-spike, free light changes normal - TTR genetics negative - Despite mild M-spike and orthostasis, I am not convinced that she has cardiac amyloidosis. Testing is negative for AL amyloidosis and PYP is equivocal at worst - MRI 03/2024 no evidence of infiltrative disease.  - NYHA  - GDMT limited by orthostasis. Continue low-dose lisinopril  -  2.  Orthostatic Hypotension/Primary Hypertension:  - suspect this is primarily related to  age and potential Parkinson's disease - suspicion for clinically-significant amyloidosis is low -> await cMRI - refuses compression hose. - can start midodrine as needed - suggest compression hose, avoid overdiuresis and add midodrine as needed   3.   Coronary artery disease:  - Known occlusion of the left  circumflex by catheterization May 2023 with the left and right to left collaterals and otherwise nonobstructive disease. - no s/s angina - continue aspirin , Plavix , Zetia , and Praluent .   Greig Mosses, NP  4:19 PM

## 2024-07-27 ENCOUNTER — Encounter: Admitting: Adult Health

## 2024-07-28 NOTE — Telephone Encounter (Signed)
 Please contact pt for future appointment. Pt due for follow up.

## 2024-07-28 NOTE — Telephone Encounter (Signed)
 LVM to schedule f/u appt

## 2024-07-31 NOTE — Telephone Encounter (Signed)
 Pt scheduled on 10/09/24

## 2024-10-06 NOTE — Progress Notes (Deleted)
 Cardiology Office Note  Date:  10/06/2024   ID:  Mescal  MYLAN LENGYEL, DOB Feb 11, 1936, MRN [id number]  PCP:  Sadie Manna, MD   No chief complaint on file.   HPI:  Ms Schneider is a pleasant 89 year old woman with history of  CAD,  occlusion of mid LCx  5/23 lower extremity swelling,Secondary to venous insufficiency  prior right knee surgery,  right DVT, status post IVC filter,  bronchiectasis on inhalers and nebulizers at home  Back surgery Echo 5/23: EF 40 to 45% who presents for follow up of her labile blood pressure and her leg swelling.  Last seen in clinic by myself 7/25   Seen by advanced heart failure March 10, 2024   bisoprolol  and losartan  had to be stopped due to orthostatic hypotension. A PYP scan was ordered. PYP on 11/11/23 was read as Grade II concerning for TTY cardiac amyloidosis, (equivocal for the presence of cardiac amyloid)   Seen by [facility] and diagnosed with [facility] Labs show:   SPEP with a very small M-spike 0.2 with igG lambda light chain specificity UPEP no m-spike Free light changes normal   Echo 5/23 EF 40-45% (felt to be ischemic in nature) Echo 6/24 EF 55-60% Echo 11/24 EF 50-55% Mod LVH. No effusion. RV mildly HK    Admitted 3/1-12/2023 with worsening gait imbalance. MRI/MRA negative. 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 Abilify  held    cMRI to look for infiltrative process. Study is for 04/03/24.  Normal LV size, low normal LV systolic function.  LVEF 50%. No LGE or scar. Normal ECV with no evidence for infiltrative disease/ amyloidosis.  On today's visit reports she is having orthostasis symptoms Takes lisinopril  5 mg with amlodipine  5 mg in the morning On today's visit difficulty standing up secondary to dizziness  Blood pressure checked with numbers as below Supine 150/83 pulse 70 Sitting 136/88 pulse 72 Standing 128/81 pulse 76 Standing 3 minutes 143/89 pulse 79  Continues to live at [employer], Active, good friends, enjoys living there  Denies significant chest pain or shortness of breath, no significant leg swelling Takes Lasix  sparingly  EKG personally reviewed by myself on todays visit      Other past medical history reviewed Cardiac cath 03/03/22 Severe single vessel coronary artery disease with occlusion of mid LCx with left-to-left and right-to-left collaterals supplying distal OM branches.  Given patient's history of chest pain ~3 weeks ago following by progressive heart failure symptoms, I suspect her initial coronary event occurred at that time. Mild-moderate, non-obstructive CAD involving the LAD and RCA. Mildly-moderately elevated left heart filling pressures. Moderate pulmonary hypertension. Normal Fick cardiac output/index.    [facility] ED on 10/15/21 for evaluation of dizziness.  weakness and disorientation, her legs felt very heavy, and she was not sure if she could take the next step.  There was no evidence of arrhythmia or ischemia on cardiac monitor with the exception of frequent PVCs. Orthostatic VS were unremarkable. Hs troponins were within normal range  Echocardiogram November 24, 2021 Reviewed on today's visit Normal LV function Normal RV function No significant valve disease   PMH:   has a past medical history of Anemia, Atrial tachycardia, Breast cancer (HCC), Bronchiectasis (HCC) (06/2007), CAD (coronary artery disease), Cardiac amyloidosis (HCC), Chronic Dizziness, Colitis, COPD (chronic obstructive pulmonary disease) (HCC), Depression, Diastolic dysfunction, DVT (deep venous thrombosis) (HCC), Fibromyalgia, Heart failure with improved ejection fraction (HFimpEF) (HCC), Ischemic cardiomyopathy, Labile Hypertension, Migraine headache with aura, Osteoarthritis, Osteoporosis, PAD (  peripheral artery disease), Pneumonia, Polio (1952), and Stenosis of carotid artery.  PSH:    Past Surgical History:  Procedure Laterality Date   ABDOMINAL HYSTERECTOMY      BACK SURGERY     BILATERAL TOTAL MASTECTOMY WITH AXILLARY LYMPH NODE DISSECTION     BREAST SURGERY     CARPAL TUNNEL RELEASE     bilateral    COLONOSCOPY WITH PROPOFOL  N/A 08/02/2015   Procedure: COLONOSCOPY WITH PROPOFOL ;  Surgeon: Lamar ONEIDA Holmes, MD;  Location[facility] ENDOSCOPY;  Service: Endoscopy;  Laterality: N/A;   FINGER TENDON REPAIR     HERNIA REPAIR     REPLACEMENT TOTAL KNEE     right knee    RIGHT/LEFT HEART CATH AND CORONARY ANGIOGRAPHY N/A 03/03/2022   Procedure: RIGHT/LEFT HEART CATH AND CORONARY ANGIOGRAPHY;  Surgeon: Mady Bruckner, MD;  Location: [facility] INVASIVE CV LAB;  Service: Cardiovascular;  Laterality: N/A;   SQUAMOUS CELL CARCINOMA EXCISION     TONSILLECTOMY     TOTAL KNEE ARTHROPLASTY Left 09/19/2020   Procedure: TOTAL KNEE ARTHROPLASTY;  Surgeon: Kathlynn Sharper, MD;  Location: [facility] ORS;  Service: Orthopedics;  Laterality: Left;   UMBILICAL HERNIA REPAIR     VAGINAL HYSTERECTOMY      Current Outpatient Medications on File Prior to Visit  Medication Sig Dispense Refill   acetaminophen  (TYLENOL ) 500 MG tablet Take 500 mg by mouth at bedtime as needed for moderate pain.     albuterol  (VENTOLIN  HFA) 108 (90 Base) MCG/ACT inhaler Inhale 2 puffs into the lungs every 6 (six) hours as needed for wheezing or shortness of breath.     aspirin  EC 81 MG tablet Take 1 tablet (81 mg total) by mouth daily. Swallow whole.     budesonide  (ENTOCORT EC ) 3 MG 24 hr capsule Take 3 mg by mouth. Taper dose     clopidogrel  (PLAVIX ) 75 MG tablet Take 1 tablet (75 mg total) by mouth daily after breakfast. 90 tablet 3   cyanocobalamin  (VITAMIN B12) 1000 MCG tablet Take 1 tablet (1,000 mcg total) by mouth daily.     cyclobenzaprine  (FLEXERIL ) 5 MG tablet Take 5 mg by mouth at bedtime as needed.     ezetimibe  (ZETIA ) 10 MG tablet Take 1 tablet (10 mg total) by mouth daily. 90 tablet 3   furosemide  (LASIX ) 20 MG tablet Take 1 tablet (20 mg total) by mouth daily as needed for fluid or  edema (daily weight gain of 2 or more pounds). 90 tablet 1   hydrALAZINE  (APRESOLINE ) 25 MG tablet Qd prn for elevated BP > 160 systolic     hydrOXYzine  (ATARAX ) 25 MG tablet Take 25 mg by mouth every 4 (four) hours as needed.     lidocaine  (LIDODERM ) 5 % Place 1 patch onto the skin every 12 (twelve) hours. Remove & Discard patch within 12 hours or as directed by MD 10 patch 0   lisinopril  (ZESTRIL ) 20 MG tablet Take 1 tablet (20 mg total) by mouth daily. 90 tablet 0   nitroGLYCERIN  (NITROLINGUAL ) 0.4 MG/SPRAY spray Place 1 spray under the tongue every 5 (five) minutes x 3 doses as needed for chest pain. 12 g 12   omega-3 acid ethyl esters (LOVAZA ) 1 g capsule Take 2 g by mouth 2 (two) times daily.     polyethylene glycol (MIRALAX  / GLYCOLAX ) packet Take 17 g by mouth daily as needed for mild constipation or moderate constipation.     potassium chloride  (KLOR-CON  M) 10 MEQ tablet Take 1 tablet (10 mEq  total) by mouth daily. 90 tablet 1   predniSONE  (DELTASONE ) 50 MG tablet Take one tablet 13 hours, 7 hours, and 1 hour prior to scan. (Patient not taking: Reported on 05/17/2024) 3 tablet 0   venlafaxine  XR (EFFEXOR -XR) 150 MG 24 hr capsule Take 150 mg by mouth daily.     No current facility-administered medications on file prior to visit.    Allergies:   Ivp dye [iodinated contrast media], Charentais melon (french melon), Codeine, Cucumber extract, Demeclocycline, Galcanezumab -gnlm, Repatha  [evolocumab ], Sulfa antibiotics, Tegaderm ag mesh [silver], Telmisartan , Tetracyclines & related, and Wild lettuce extract (lactuca virosa)   Social History:  The patient  reports that she has never smoked. She has never used smokeless tobacco. She reports current alcohol  use of about 4.0 standard drinks of alcohol  per week. She reports that she does not use drugs.   Family History:   family history includes Arthritis in her father and mother; Atrial fibrillation in her sister; Bladder Cancer in her father; Colon  cancer in her sister; Endometrial cancer in her sister; Ovarian cancer in her mother; Pancreatic cancer in her mother.    Review of Systems: Review of Systems  Constitutional: Negative.   HENT: Negative.    Respiratory:  Positive for shortness of breath.   Cardiovascular: Negative.   Gastrointestinal: Negative.   Neurological: Negative.   Psychiatric/Behavioral: Negative.    All other systems reviewed and are negative.  PHYSICAL EXAM: VS:  There were no vitals taken for this visit. , BMI There is no height or weight on file to calculate BMI.  Constitutional:  oriented to person, place, and time. No distress.  HENT:  Head: Grossly normal Eyes:  no discharge. No scleral icterus.  Neck: No JVD, no carotid bruits  Cardiovascular: Regular rate and rhythm, no murmurs appreciated Pulmonary/Chest: Clear to auscultation bilaterally, no wheezes or rails Abdominal: Soft.  no distension.  no tenderness.  Musculoskeletal: Normal range of motion Neurological:  normal muscle tone. Coordination normal. No atrophy Skin: Skin warm and dry Psychiatric: normal affect, pleasant  Recent Labs: 01/15/2024: B Natriuretic Peptide 88.9 05/06/2024: ALT 23 05/15/2024: BUN 28; Creatinine, Ser 0.56; Hemoglobin 13.9; Platelets 240; Potassium 4.0; Sodium 141   Lipid Panel Lab Results  Component Value Date   CHOL 144 12/04/2023   HDL 102 12/04/2023   LDLCALC 33 12/04/2023   TRIG 47 12/04/2023    Wt Readings from Last 3 Encounters:  05/17/24 123 lb 12.8 oz (56.2 kg)  05/15/24 120 lb (54.4 kg)  04/11/24 122 lb 6 oz (55.5 kg)     ASSESSMENT AND PLAN:  Coronary artery disease with stable angina Catheterization May 2023, occluded circumflex with collaterals On Zetia   Repatha  appears off her list, reports having a localized reaction as well as Praluent  continue aspirin  81, Plavix  Ejection fraction normalized - Cardiac MRI with no infiltrate concerning for amyloid  Labile blood pressure Blood pressure  not low today but she reports having orthostasis type symptoms Prior history of dizziness on select blood pressure medication - Recommend she move amlodipine  to the evening continue lisinopril  in the morning  History of DVT (deep vein thrombosis) No recurrent episodes, stable  Chronic respiratory distress /bronchiectasis without complication (HCC) chronic shortness of breath,  Previously on oxygen, not on oxygen today Followed by pulmonary, on inhalers  Leg swelling taking Lasix  20 mg as needed Stable renal function,  Denies edema, PND orthopnea  PAD/carotid disease Continue Zetia , Reports having a localized reaction to both Repatha  and Praluent  Consider Leqvio  Atrial tachycardia Previously on bisoprolol  Denies tachycardia  Zio monitor previously completed Denies symptoms of tachycardia or palpitations  Statin myalgias Will wean off statin, Lipitor   No orders of the defined types were placed in this encounter.     Signed, Velinda Lunger, M.D., Ph.D. 10/06/2024  [facility] [place], [place] [phone]

## 2024-10-09 ENCOUNTER — Ambulatory Visit: Attending: Cardiovascular Disease | Admitting: Cardiovascular Disease

## 2024-10-09 DIAGNOSIS — I739 Peripheral vascular disease, unspecified: Secondary | ICD-10-CM

## 2024-10-09 DIAGNOSIS — I25118 Atherosclerotic heart disease of native coronary artery with other forms of angina pectoris: Secondary | ICD-10-CM

## 2024-10-09 DIAGNOSIS — I5032 Chronic diastolic (congestive) heart failure: Secondary | ICD-10-CM
